# Patient Record
Sex: Female | Born: 1940 | Race: White | Hispanic: No | Smoking: Never smoker
Health system: Southern US, Community
[De-identification: ages and names within clinical notes are randomized; demographics above are authoritative.]

## PROBLEM LIST (undated history)

## (undated) DIAGNOSIS — D649 Anemia, unspecified: Secondary | ICD-10-CM

## (undated) DIAGNOSIS — R51 Headache: Secondary | ICD-10-CM

## (undated) DIAGNOSIS — R519 Headache, unspecified: Secondary | ICD-10-CM

## (undated) DIAGNOSIS — H35033 Hypertensive retinopathy, bilateral: Secondary | ICD-10-CM

## (undated) DIAGNOSIS — Z860100 Personal history of colon polyps, unspecified: Secondary | ICD-10-CM

## (undated) DIAGNOSIS — F32A Depression, unspecified: Secondary | ICD-10-CM

## (undated) DIAGNOSIS — M797 Fibromyalgia: Secondary | ICD-10-CM

## (undated) DIAGNOSIS — J9811 Atelectasis: Secondary | ICD-10-CM

## (undated) DIAGNOSIS — M069 Rheumatoid arthritis, unspecified: Secondary | ICD-10-CM

## (undated) DIAGNOSIS — G63 Polyneuropathy in diseases classified elsewhere: Secondary | ICD-10-CM

## (undated) DIAGNOSIS — M1990 Unspecified osteoarthritis, unspecified site: Secondary | ICD-10-CM

## (undated) DIAGNOSIS — E669 Obesity, unspecified: Secondary | ICD-10-CM

## (undated) DIAGNOSIS — F419 Anxiety disorder, unspecified: Secondary | ICD-10-CM

## (undated) DIAGNOSIS — F329 Major depressive disorder, single episode, unspecified: Secondary | ICD-10-CM

## (undated) DIAGNOSIS — N1330 Unspecified hydronephrosis: Secondary | ICD-10-CM

## (undated) DIAGNOSIS — Z5189 Encounter for other specified aftercare: Secondary | ICD-10-CM

## (undated) DIAGNOSIS — K449 Diaphragmatic hernia without obstruction or gangrene: Secondary | ICD-10-CM

## (undated) DIAGNOSIS — H25013 Cortical age-related cataract, bilateral: Secondary | ICD-10-CM

## (undated) DIAGNOSIS — E7800 Pure hypercholesterolemia, unspecified: Secondary | ICD-10-CM

## (undated) DIAGNOSIS — D1771 Benign lipomatous neoplasm of kidney: Secondary | ICD-10-CM

## (undated) DIAGNOSIS — T4145XA Adverse effect of unspecified anesthetic, initial encounter: Secondary | ICD-10-CM

## (undated) DIAGNOSIS — I10 Essential (primary) hypertension: Secondary | ICD-10-CM

## (undated) DIAGNOSIS — M810 Age-related osteoporosis without current pathological fracture: Secondary | ICD-10-CM

## (undated) HISTORY — DX: Unspecified hydronephrosis: N13.30

## (undated) HISTORY — DX: Personal history of colon polyps, unspecified: Z86.0100

## (undated) HISTORY — DX: Rheumatoid arthritis, unspecified: M06.9

## (undated) HISTORY — DX: Unspecified osteoarthritis, unspecified site: M19.90

## (undated) HISTORY — DX: Benign lipomatous neoplasm of kidney: D17.71

## (undated) HISTORY — PX: OTHER SURGICAL HISTORY: SHX169

## (undated) HISTORY — DX: Anxiety disorder, unspecified: F41.9

## (undated) HISTORY — DX: Hypertensive retinopathy, bilateral: H35.033

## (undated) HISTORY — DX: Diaphragmatic hernia without obstruction or gangrene: K44.9

## (undated) HISTORY — DX: Essential (primary) hypertension: I10

## (undated) HISTORY — DX: Obesity, unspecified: E66.9

## (undated) HISTORY — DX: Age-related osteoporosis without current pathological fracture: M81.0

## (undated) HISTORY — PX: JOINT REPLACEMENT: SHX530

## (undated) HISTORY — DX: Cortical age-related cataract, bilateral: H25.013

## (undated) HISTORY — DX: Encounter for other specified aftercare: Z51.89

## (undated) HISTORY — PX: CATARACT EXTRACTION, BILATERAL: SHX1313

## (undated) HISTORY — PX: NISSEN FUNDOPLICATION: SHX2091

## (undated) HISTORY — DX: Pure hypercholesterolemia, unspecified: E78.00

## (undated) HISTORY — PX: CATARACT EXTRACTION: SUR2

## (undated) HISTORY — DX: Personal history of colonic polyps: Z86.010

---
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
## 1967-07-08 HISTORY — PX: ABDOMINAL HYSTERECTOMY: SHX81

## 1987-07-08 HISTORY — PX: BREAST BIOPSY: SHX20

## 1987-07-08 HISTORY — PX: PARAESOPHAGEAL HERNIA REPAIR: SHX2161

## 1999-07-08 HISTORY — PX: TOTAL KNEE ARTHROPLASTY: SHX125

## 1999-09-25 ENCOUNTER — Encounter: Payer: Self-pay | Admitting: Orthopedic Surgery

## 1999-10-02 ENCOUNTER — Inpatient Hospital Stay (HOSPITAL_COMMUNITY): Admission: RE | Admit: 1999-10-02 | Discharge: 1999-10-07 | Payer: Self-pay | Admitting: Orthopedic Surgery

## 1999-10-02 ENCOUNTER — Encounter: Payer: Self-pay | Admitting: Orthopedic Surgery

## 2006-05-14 ENCOUNTER — Ambulatory Visit: Payer: Self-pay | Admitting: Internal Medicine

## 2006-07-23 ENCOUNTER — Ambulatory Visit: Payer: Self-pay | Admitting: Internal Medicine

## 2007-03-09 ENCOUNTER — Ambulatory Visit (HOSPITAL_COMMUNITY): Admission: RE | Admit: 2007-03-09 | Discharge: 2007-03-10 | Payer: Self-pay | Admitting: General Surgery

## 2007-03-09 ENCOUNTER — Encounter (INDEPENDENT_AMBULATORY_CARE_PROVIDER_SITE_OTHER): Payer: Self-pay | Admitting: General Surgery

## 2008-07-07 HISTORY — PX: TOTAL KNEE ARTHROPLASTY: SHX125

## 2008-07-07 HISTORY — PX: CHOLECYSTECTOMY: SHX55

## 2008-07-31 ENCOUNTER — Inpatient Hospital Stay (HOSPITAL_COMMUNITY): Admission: RE | Admit: 2008-07-31 | Discharge: 2008-08-03 | Payer: Self-pay | Admitting: Orthopedic Surgery

## 2008-08-28 ENCOUNTER — Encounter: Admission: RE | Admit: 2008-08-28 | Discharge: 2008-10-17 | Payer: Self-pay | Admitting: Orthopedic Surgery

## 2009-06-20 ENCOUNTER — Emergency Department (HOSPITAL_COMMUNITY): Admission: EM | Admit: 2009-06-20 | Discharge: 2009-06-20 | Payer: Self-pay | Admitting: Emergency Medicine

## 2009-11-23 ENCOUNTER — Encounter (INDEPENDENT_AMBULATORY_CARE_PROVIDER_SITE_OTHER): Payer: Self-pay | Admitting: Emergency Medicine

## 2009-11-23 ENCOUNTER — Ambulatory Visit: Payer: Self-pay | Admitting: Vascular Surgery

## 2009-11-23 ENCOUNTER — Emergency Department (HOSPITAL_COMMUNITY): Admission: EM | Admit: 2009-11-23 | Discharge: 2009-11-23 | Payer: Self-pay | Admitting: Emergency Medicine

## 2010-07-07 DIAGNOSIS — T8859XA Other complications of anesthesia, initial encounter: Secondary | ICD-10-CM

## 2010-07-07 HISTORY — PX: BACK SURGERY: SHX140

## 2010-07-07 HISTORY — DX: Other complications of anesthesia, initial encounter: T88.59XA

## 2010-08-28 ENCOUNTER — Other Ambulatory Visit (INDEPENDENT_AMBULATORY_CARE_PROVIDER_SITE_OTHER): Payer: Medicare Other

## 2010-08-28 DIAGNOSIS — E7800 Pure hypercholesterolemia, unspecified: Secondary | ICD-10-CM

## 2010-08-28 DIAGNOSIS — Z79899 Other long term (current) drug therapy: Secondary | ICD-10-CM

## 2010-09-03 ENCOUNTER — Ambulatory Visit (INDEPENDENT_AMBULATORY_CARE_PROVIDER_SITE_OTHER): Payer: Medicare Other | Admitting: Cardiology

## 2010-09-03 DIAGNOSIS — E7800 Pure hypercholesterolemia, unspecified: Secondary | ICD-10-CM

## 2010-09-03 DIAGNOSIS — Z79899 Other long term (current) drug therapy: Secondary | ICD-10-CM

## 2010-09-03 DIAGNOSIS — I119 Hypertensive heart disease without heart failure: Secondary | ICD-10-CM

## 2010-09-04 ENCOUNTER — Ambulatory Visit: Payer: Self-pay | Admitting: Cardiology

## 2010-10-21 LAB — COMPREHENSIVE METABOLIC PANEL
ALT: 21 U/L (ref 0–35)
BUN: 19 mg/dL (ref 6–23)
CO2: 31 mEq/L (ref 19–32)
Calcium: 9.2 mg/dL (ref 8.4–10.5)
Creatinine, Ser: 0.94 mg/dL (ref 0.4–1.2)
GFR calc non Af Amer: 59 mL/min — ABNORMAL LOW (ref >60)
Glucose, Bld: 95 mg/dL (ref 70–99)
Sodium: 139 mEq/L (ref 135–145)
Total Protein: 7.1 g/dL (ref 6.0–8.3)

## 2010-10-21 LAB — BASIC METABOLIC PANEL
BUN: 14 mg/dL (ref 6–23)
CO2: 31 mEq/L (ref 19–32)
Calcium: 8.2 mg/dL — ABNORMAL LOW (ref 8.4–10.5)
Calcium: 8.3 mg/dL — ABNORMAL LOW (ref 8.4–10.5)
Creatinine, Ser: 0.79 mg/dL (ref 0.4–1.2)
GFR calc Af Amer: >60 mL/min (ref >60)
GFR calc Af Amer: >60 mL/min (ref >60)
GFR calc non Af Amer: >60 mL/min (ref >60)
Glucose, Bld: 122 mg/dL — ABNORMAL HIGH (ref 70–99)
Potassium: 4.5 mEq/L (ref 3.5–5.1)
Sodium: 140 mEq/L (ref 135–145)

## 2010-10-21 LAB — ABO/RH: ABO/RH(D): O POS

## 2010-10-21 LAB — CBC
HCT: 29.7 % — ABNORMAL LOW (ref 36.0–46.0)
HCT: 30.6 % — ABNORMAL LOW (ref 36.0–46.0)
Hemoglobin: 10 g/dL — ABNORMAL LOW (ref 12.0–15.0)
Hemoglobin: 10.2 g/dL — ABNORMAL LOW (ref 12.0–15.0)
Hemoglobin: 10.7 g/dL — ABNORMAL LOW (ref 12.0–15.0)
Hemoglobin: 13 g/dL (ref 12.0–15.0)
MCHC: 33.5 g/dL (ref 30.0–36.0)
MCHC: 33.5 g/dL (ref 30.0–36.0)
MCV: 90.5 fL (ref 78.0–100.0)
MCV: 91.1 fL (ref 78.0–100.0)
MCV: 91.7 fL (ref 78.0–100.0)
Platelets: 193 10*3/uL (ref 150–400)
RBC: 3.24 MIL/uL — ABNORMAL LOW (ref 3.87–5.11)
RBC: 3.35 MIL/uL — ABNORMAL LOW (ref 3.87–5.11)
RBC: 3.38 MIL/uL — ABNORMAL LOW (ref 3.87–5.11)
RBC: 3.53 MIL/uL — ABNORMAL LOW (ref 3.87–5.11)
RDW: 13.1 % (ref 11.5–15.5)
RDW: 13.5 % (ref 11.5–15.5)
WBC: 13.6 10*3/uL — ABNORMAL HIGH (ref 4.0–10.5)
WBC: 13.8 10*3/uL — ABNORMAL HIGH (ref 4.0–10.5)

## 2010-10-21 LAB — APTT: aPTT: 27 seconds (ref 24–37)

## 2010-10-21 LAB — TYPE AND SCREEN
ABO/RH(D): O POS
Antibody Screen: NEGATIVE

## 2010-10-21 LAB — URINALYSIS, ROUTINE W REFLEX MICROSCOPIC
Ketones, ur: NEGATIVE mg/dL
Nitrite: NEGATIVE
Protein, ur: NEGATIVE mg/dL
Urobilinogen, UA: 0.2 mg/dL (ref 0.0–1.0)

## 2010-10-21 LAB — PROTIME-INR
INR: 0.9 (ref 0.00–1.49)
INR: 1.1 (ref 0.00–1.49)
INR: 2.2 — ABNORMAL HIGH (ref 0.00–1.49)
Prothrombin Time: 12.6 seconds (ref 11.6–15.2)
Prothrombin Time: 14.2 seconds (ref 11.6–15.2)

## 2010-11-18 ENCOUNTER — Other Ambulatory Visit (HOSPITAL_COMMUNITY): Payer: Self-pay | Admitting: Orthopedic Surgery

## 2010-11-18 DIAGNOSIS — C189 Malignant neoplasm of colon, unspecified: Secondary | ICD-10-CM

## 2010-11-19 NOTE — Op Note (Signed)
NAMESEBA, MADOLE NO.:  [id number]   MEDICAL RECORD NO.:  [id number]          PATIENT TYPE:  INP   LOCATION:  0007                         FACILITY:  [facility]   PHYSICIAN:  Ollen Gross, M.D.    DATE OF BIRTH:  11/20/1940   DATE OF PROCEDURE:  07/31/2008  DATE OF DISCHARGE:                               OPERATIVE REPORT   PREOPERATIVE DIAGNOSIS:  Osteoarthritis, right knee.   POSTOPERATIVE DIAGNOSIS:  Osteoarthritis, right knee.   PROCEDURE:  Right total knee arthroplasty.   SURGEON:  Ollen Gross, M.D.   ASSISTANT:  Avel Peace PA-C   ANESTHESIA:  General with postop Marcaine pain pump.   ESTIMATED BLOOD LOSS:  Minimal.   DRAINS:  None.   TOURNIQUET TIME:  33 minutes at 300 mmHg.   COMPLICATIONS:  None.   CONDITION:  Stable to recovery room.   CLINICAL NOTE:  Ms. Venier is a 70 year old female with end-stage  arthritis of the right knee with progressively worsening pain and  dysfunction.  She has failed nonoperative management and presents now  for right total knee arthroplasty.   PROCEDURE IN DETAIL:  After successful administration of general  anesthetic, a tourniquet is placed high on her right thigh and the right  lower extremity is prepped and draped in the usual sterile fashion.  Extremities wrapped in Esmarch, knee flexed, tourniquet inflated to 300  mmHg.  Midline incision was made with 10 blade through subcutaneous  tissue to the level of the extensor mechanism.  A fresh blade is used to  make a medial parapatellar arthrotomy.  Soft tissue over the proximal  medial tibia is subperiosteally elevated to the joint line with the  knife and into the semimembranosus bursa with a Cobb elevator.  Soft  tissue laterally is elevated with attention being paid to avoiding the  patellar tendon on tibial tubercle.  The patella was subluxed laterally,  knee flexed 90 degrees, ACL and PCL removed.  A drill was used to create  a starting hole in the  distal femur.  The canal was thoroughly  irrigated.  The 5 degrees right valgus alignment guide is placed  referencing off the posterior condyles, rotations marked and the block  pinned to remove 11 mm of the distal femur.  I took 11 because of preop  flexion contracture.  Distal femoral resection is made with an  oscillating saw.  Sizing block was placed and size 3 is most  appropriate.  Rotations marked off the epicondylar axis.  Size 3 cutting  block is placed and the anterior, posterior chamfer cuts are made.   The tibia is subluxed forward and the menisci are removed.  Extramedullary tibial alignment guide is placed referencing proximally  at the medial aspect of the tibial tubercle or distally along the second  metatarsal axis and tibial crest.  The block is pinned to remove 2 mm  off the more deficient medial side.  Tibial resection is made with an  oscillating saw.  Size 3 is the most appropriate tibial component and  the proximal tibia is then prepared with the modular drill and keel  punch for the size 3 three.  Femoral preparation is completed with the  intercondylar cut.   Size 3 mobile bearing tibial trial, size 3 posterior stabilized femoral  trial and a 10 mm posterior stabilized rotating platform insert trial  are placed.  With a 10 she hyperextended a tiny bit so I went to 12.5  which allowed for full extension with excellent varus-valgus, anterior-  posterior balance throughout full range of motion.  Patella was then  everted and thickness measured to be a 22 mm.  Freehand resection was  taken to 12 mm, 38 template is placed, lug holes were drilled, trial  patella was placed and it tracks normally.  Osteophytes removed off the  posterior femur with the trial in place.  All trials are removed and the  cut bone surfaces are prepared with pulsatile lavage.  Cement was mixed  and once ready for implantation. the size 3 mobile bearing tibial tray,  size 3 posterior  stabilized femur and 38 patella are cemented into  place.  Patella was held with a clamp.  Trial 12.5 insert is placed,  knee held in full extension and all extruded cement removed.  When the  cement is fully hardened then the permanent 12.5 mm posterior stabilized  rotating platform insert is placed into the tibial tray.  The wound was  copiously irrigated with saline solution and the FloSeal injected on the  posterior capsule, mediolateral gutters and suprapatellar area.  Moist  sponge is placed and tourniquet released for total time of 33 minutes.  The sponge is held for 2 minutes then removed.  Minimal bleeding was  encountered.  The bleeding that is encountered is stopped with  electrocautery.  The wound is then copiously irrigated with saline  solution and the arthrotomy closed with interrupted #1 PDS.  Flexion  against gravity is about 135 degrees.  Subcu is closed with interrupted  2-0 Vicryl and subcuticular running 4-0 Monocryl.  The incision is  cleaned and dried and the catheter for the Marcaine pain pump was placed  in the pump is initiated.  Steri-Strips and bulky sterile dressing are  applied and she is placed into a knee immobilizer, awakened and  transferred to recovery in stable condition.      Ollen Gross, M.D.  Electronically Signed     FA/MEDQ  D:  07/31/2008  T:  07/31/2008  Job:  [id number]

## 2010-11-19 NOTE — Op Note (Signed)
Laura Chambers, Laura Chambers             ACCOUNT NO.:  [id number]   MEDICAL RECORD NO.:  [id number]          PATIENT TYPE:  AMB   LOCATION:  DAY                          FACILITY:  [facility]   PHYSICIAN:  Sharlet Salina T. Hoxworth, M.D.DATE OF BIRTH:  [birth year]   DATE OF PROCEDURE:  03/09/2007  DATE OF DISCHARGE:                               OPERATIVE REPORT   PREOPERATIVE DIAGNOSIS:  Cholelithiasis and cholecystitis.   POSTOPERATIVE DIAGNOSIS:  Cholelithiasis and cholecystitis.   SURGICAL PROCEDURE:  Laparoscopic cholecystectomy with intraoperative  cholangiogram.   SURGEON:  Sharlet Salina T. Hoxworth, M.D.   ANESTHESIA:  General.   BRIEF HISTORY:  Laura Chambers is a 70 year old female who presents  with one severe episode of epigastric and right upper quadrant pain  radiating to her back typical for biliary colic.  She has had some  ongoing milder symptoms.  Workup has included a gallbladder ultrasound  showing a large gallstone.  She is felt to have symptomatic  cholelithiasis.  Laparoscopic cholecystectomy with cholangiogram has  been recommended and accepted.  The patient has had a previous Nissen  fundoplication open years ago through an upper midline incision.  The  nature of the procedure, risks of bleeding, infection, bile leak, bile  duct injury, or possible need for open procedure due to adhesions have  been discussed understood.  She is brought to the operating room for the  procedure.   DESCRIPTION OF OPERATION:  The patient was brought to the operating  room, placed in the supine position on the operating table and general  endotracheal anesthesia was induced.  The abdomen was widely sterilely  prepped and draped.  She received preoperative IV antibiotics.  PAS were  in place.  Correct patient and procedure were verified.  Trocar sites  were infiltrated with local anesthesia.  Access was then obtained in the  right subcostal space with a 5-mm OptiView trocar and free  peritoneal  cavity entered without difficulty.  The right upper quadrant was free of  adhesions.  There were some adhesions extensively along the midline in  the entire upper abdomen extending over toward the right upper quadrant.  A second 5-mm trocar was placed under direct vision more laterally.  With these two sites and using a 5-mm scope, extensive omental adhesions  were taken down from the anterior abdominal wall back to the midline.  The transverse colon was also identified up to the abdominal wall but  not densely adherent and it was able to be avoided clearing back to the  midline.  Following this under direct vision an 11-mm trocar was placed  near the umbilicus just to the right of the midline incision and another  11-mm trocar just to the right of the xiphoid.  The 10-mm camera was  placed through the periumbilical port.  The gallbladder fundus was  grasped and elevated up over the liver and the infundibulum retracted  inferolaterally.  There was no severe inflammation.  Peritoneum anterior  and posterior Calot's triangle was incised and fibrofatty tissue was  stripped off the neck of the gallbladder toward the porta hepatis.  The  anterior branch  of the cystic artery was clearly seen coursing upon the  gallbladder wall.  It was divided between two proximal and one distal  clip.  Calot's triangle was thoroughly dissected, the cystic duct  identified at the gallbladder junction and dissected 360 degrees and  then dissected out over about a centimeter.  When the anatomy was clear,  the cystic duct was clipped at the gallbladder junction and an operative  cholangiogram obtained through the cystic duct.  This showed a long  corkscrew cystic duct which entered a normal common bile duct and  intrahepatic ducts with free flow into the duodenum and no filling  defects.  Following this, the cholangiocatheter was removed.  The cystic  duct was dissected down about another centimeter to  where it tapered  down a little bit more, and at this point it was triply clipped and  divided.  The gallbladder was dissected free from its bed using hook  cautery.  A posterior branch of the cystic artery was divided between  clips.  The gallbladder was detached and removed through the  periumbilical site.  The right upper quadrant was thoroughly irrigated  and complete hemostasis assured.  There was a large gallstone that  required dilating the 11-mm trocar site to remove the gallbladder.  This  was then closed under direct vision with a couple of interrupted 0  Vicryl sutures placed with the Endoclose.  The operative site was again  inspected for hemostasis.  All CO2 was evacuated and trocars removed.  Skin incisions were closed with interrupted subcuticular Monocryl and  Dermabond.  The patient was taken to the recovery room in good  condition.      Lorne Skeens. Hoxworth, M.D.  Electronically Signed     BTH/MEDQ  D:  03/09/2007  T:  03/09/2007  Job:  [id number]

## 2010-11-19 NOTE — Discharge Summary (Signed)
NAMEGERALDA, BAUMGARDNER NO.:  [id number]   MEDICAL RECORD NO.:  [id number]          PATIENT TYPE:  INP   LOCATION:  1611                         FACILITY:  [facility]   PHYSICIAN:  Ollen Gross, M.D.    DATE OF BIRTH:  October 06, 1940   DATE OF ADMISSION:  07/31/2008  DATE OF DISCHARGE:  08/03/2008                               DISCHARGE SUMMARY   ADMISSION DIAGNOSES:  1. End-stage arthritis right knee.  2. Osteoarthritis right hip.  3. Hypertension.  4. Hypercholesterolemia.  5. Paraesophageal hernia.  6. Diverticulosis.  7. History of colon cancer.  8. Osteoporosis.  9. Degenerative disk disease.   DISCHARGE DIAGNOSES:  1. Osteoarthritis right knee status post right total knee replacement      arthroplasty.  2. Mild postoperative acute blood loss anemia, did not require      transfusion.  3. Hypertension.  4. Hypercholesterolemia.  5. Paraesophageal hernia.  6. Diverticulosis.  7. History of colon cancer.  8. Osteoporosis.  9. Degenerative disk disease.   PROCEDURE:  July 31, 2008, right total knee arthroplasty.   SURGEON:  Ollen Gross, M.D.   ASSISTANT:  Alexzandrew L. Perkins, P.A.C.   ANESTHESIA:  General.   CONSULTATIONS:  None.   BRIEF HISTORY:  Ms. Wissner is a 70 year old female with end-stage  arthritis of the right knee, progressive worsening pain and dysfunction  who failed operative management, and now presents for right total knee  arthroplasty.   LABORATORY DATA:  Preoperative CBC showed hemoglobin 13, hematocrit  39.0, white cell count 10.5, platelets 194.  Serial CBCs were followed.  Hemoglobin dropped down to 10.3 and then back to 10.7.  Last known H and  H 10.0 and 29.7.  PT/PTT preoperative 12.6 and 27 respectively, INR  0.90.  Serial pro-times followed on Coumadin protocol.  Last PT/INR 24.0  and 2.0.  Chem panel on admission all within normal limits.  Serial  BMETs were followed.  Electrolytes remained within normal limits.  Preoperative UA was negative.  Blood group type O+.   DIAGNOSTICS:  EKG July 19, 2008:  Normal sinus rhythm within normal  limits confirmed by Dr. Patty Sermons.   HOSPITAL COURSE:  The patient was admitted to [facility] and  taken to OR for the above stated procedure without complication.  The  patient tolerated the procedure well and then transferred to the  recovery room on the orthopedic floor.  Started on PCA and p.o.  analgesic pain control following surgery.  Given 20 hours of  postoperative IV antibiotics.  She was started back on her home meds,  but blood pressure meds were put on parameters to try to avoid any  hypotension postoperative.  She had excellent urinary output on day one.  She had some drainage on the dressing, so we changed that on day one.  Incision looked good.  Hemoglobin was stable at 10.3.  She started  getting up out of bed with PT.  She walked about 20 feet.  By day 2, she  was actually doing better.  Hemoglobin was stable.  Incision continued  to heal well.  She continued  to progress with physical therapy walking  about 20 feet and that afternoon got up to about 200 feet.  She had very  well that afternoon.  She was seen on the morning of day 3.  Her pain  was under good control.  She had met her goals with therapy and was  discharged home.   DISCHARGE/PLAN:  The patient was discharged home on August 03, 2008.   DISCHARGE MEDICATIONS:  1. Dilaudid.  2. Robaxin.  3. Coumadin.   FOLLOW UP:  Follow up in 2 weeks.   ACTIVITY:  Weightbearing as tolerated.  Total knee protocol.  Home  health PT and home nursing.  Follow up in 2 weeks.   DISPOSITION:  Home.   CONDITION ON DISCHARGE:  Improving.      Alexzandrew L. Perkins, P.A.C.      Ollen Gross, M.D.  Electronically Signed    ALP/MEDQ  D:  09/07/2008  T:  09/07/2008  Job:  [id number]   cc:   Ollen Gross, M.D.  Fax: [phone]   Cassell Clement, M.D.  Fax: [phone]

## 2010-11-19 NOTE — H&P (Signed)
Laura Chambers, Laura Chambers NO.:  [id number]   MEDICAL RECORD NO.:  [id number]          PATIENT TYPE:  INP   LOCATION:                               FACILITY:  [facility]   PHYSICIAN:  Ollen Gross, M.D.    DATE OF BIRTH:  1941-01-01   DATE OF ADMISSION:  07/31/2008  DATE OF DISCHARGE:                              HISTORY & PHYSICAL   DATE OF ADMISSION:  July 31, 2008.   HISTORY OF PRESENT ILLNESS:  [name] is a 70 year old female with  right leg pain.  Evaluations have shown that she has significant  arthritic changes of the knee with end-stage osteoarthritis of the knee,  but she also has arthritic changes of her right hip.  The patient has  pain with range of motion of the knee, pain with weightbearing.  She has  failed conservative treatment.  The patient has elected to proceed with  a total knee arthroplasty.   ALLERGIES:  TROVAN, LEVAQUIN, MORPHINE, MUSCLE SPASMS WITH LOCAL  ANESTHETIC.   PRIMARY CARE PHYSICIAN:  Cassell Clement, M.D.   CURRENT MEDICATIONS:  1. Atenolol 25 mg once a day.  2. Hyzaar 100/25 mg once a day.  3. Crestor 15 mg a day.  4. Folic acid.  5. Os-Cal 600 plus D.  6. Centrum Silver.   PAST MEDICAL HISTORY:  Includes:  1. Hypertension.  2. Hypercholesterolemia.  3. Paraesophageal hernia.  4. Diverticulitis.  5. History of colon cancer.  6. Osteoporosis.  7. Degenerative disk disease.   REVIEW OF SYSTEMS:  Is negative for any neurologic issues or pulmonary  issues.  CARDIOVASCULAR:  Hypertension is well-controlled.  She is  scheduled for a stress test evaluation by Dr. Patty Sermons this week.  GI:  She has had a paraesophageal hernia.  She does report a history of bad  reflux and ulcers in the past but it improved when she had surgical  repair of her hernia.  She denies any liver issues.  Her last  diverticulitis attack was several years previous.  She denies any GU,  endocrine or hematologic issues at this time.   PAST  SURGICAL HISTORY:  Includes:  1. A T and A in 1946.  2. Hysterectomy in 1969.  3. Paraesophageal hernia and esophageal wrap in 1989.  4. Left knee replacement in 2001.  5. Cholecystectomy in 2008 without any anesthesia problems other than      significant nausea.  Due to the esophageal wrap the patient      indicates she is a very difficult time vomiting.   FAMILY MEDICAL HISTORY:  Father is deceased from diabetic complications  at 43.  Mother is deceased at 43 from COPD.  Three siblings with  diabetes, hypertension, arthritis.   SOCIAL HISTORY:  The patient is widowed.  She was an Print production planner in  the past.  Never smoked.  No alcohol.  Lives alone in a [place] house.   PHYSICAL EXAM:  VITALS:  Height is 5 feet 7 inches, weight is 198  pounds.  Temperature:  She is afebrile.  Pulse of 74, respirations of  12, blood pressure  is 158/78.  GENERAL:  The patient is a healthy-appearing, well-developed female,  conscious, alert and appropriate.  HEENT:  Head was normocephalic.  Gross hearing is intact.  NECK:  Was supple.  Good range of motion.  No lymphadenopathy.  CHEST:  Lung sounds were clear and equal bilaterally.  HEART:  Regular rate and rhythm.  ABDOMEN:  Soft, nontender.  Bowel sounds present.  EXTREMITIES:  Right knee lacked about 5 degrees of extension.  She is  able to flex it back to 120.  She had no gross instability.  Her right  hip:  She was able to fully extend, flexion up to 110, 20 degrees of  internal rotation, 10 degrees of external rotation.  She had full range  of motion of her left lower extremity at the hip and knee today without  any difficulty.  She had good range of motion of her upper extremities.  PERIPHERAL VASCULAR:  Carotid pulses were 2+, no bruits.  Radial pulses  2+.  Dorsalis pedis pulses 1+.  NEURO:  The patient was conscious, alert and appropriate.   IMPRESSION:  1. End-stage osteoarthritis right knee.  2. Osteoarthritis right hip.  3.  Hypertension.  4. Hypercholesterolemia.  5. Paraesophageal hernia repair.   PLAN:  The patient will undergo all routine laboratories and tests prior  to having a right total knee arthroplasty by Dr. Lequita Halt at [facility] on July 31, 2008.      Jamelle Rushing, P.A.      Ollen Gross, M.D.  Electronically Signed    RWK/MEDQ  D:  07/17/2008  T:  07/17/2008  Job:  [id number]   cc:   Ollen Gross, M.D.  Fax: [phone]

## 2010-11-21 ENCOUNTER — Ambulatory Visit (HOSPITAL_COMMUNITY): Payer: Medicare Other

## 2010-11-21 ENCOUNTER — Encounter (HOSPITAL_COMMUNITY): Payer: Self-pay

## 2010-11-21 ENCOUNTER — Inpatient Hospital Stay (HOSPITAL_COMMUNITY): Admission: RE | Admit: 2010-11-21 | Payer: Medicare Other | Source: Ambulatory Visit

## 2010-11-21 ENCOUNTER — Encounter (HOSPITAL_COMMUNITY)
Admission: RE | Admit: 2010-11-21 | Discharge: 2010-11-21 | Disposition: A | Discharge status: Home / Self Care | Payer: Medicare Other | Source: Ambulatory Visit | Attending: Orthopedic Surgery | Admitting: Orthopedic Surgery

## 2010-11-21 DIAGNOSIS — R109 Unspecified abdominal pain: Secondary | ICD-10-CM | POA: Insufficient documentation

## 2010-11-21 DIAGNOSIS — C189 Malignant neoplasm of colon, unspecified: Secondary | ICD-10-CM

## 2010-11-21 DIAGNOSIS — N1330 Unspecified hydronephrosis: Secondary | ICD-10-CM | POA: Insufficient documentation

## 2010-11-21 DIAGNOSIS — Z96659 Presence of unspecified artificial knee joint: Secondary | ICD-10-CM | POA: Insufficient documentation

## 2010-11-21 DIAGNOSIS — M545 Low back pain, unspecified: Secondary | ICD-10-CM | POA: Insufficient documentation

## 2010-11-21 DIAGNOSIS — M25559 Pain in unspecified hip: Secondary | ICD-10-CM | POA: Insufficient documentation

## 2010-11-21 DIAGNOSIS — M79609 Pain in unspecified limb: Secondary | ICD-10-CM | POA: Insufficient documentation

## 2010-11-21 DIAGNOSIS — Z85038 Personal history of other malignant neoplasm of large intestine: Secondary | ICD-10-CM | POA: Insufficient documentation

## 2010-11-21 MED ORDER — TECHNETIUM TC 99M MEDRONATE IV KIT
25.0000 | PACK | Freq: Once | INTRAVENOUS | Status: AC | PRN
  Administered 2010-11-21: 25 via INTRAVENOUS

## 2011-01-03 ENCOUNTER — Encounter: Payer: Self-pay | Admitting: Cardiology

## 2011-02-13 ENCOUNTER — Telehealth: Payer: Self-pay | Admitting: Cardiology

## 2011-02-13 ENCOUNTER — Encounter (HOSPITAL_COMMUNITY)
Admission: RE | Admit: 2011-02-13 | Discharge: 2011-02-13 | Disposition: A | Discharge status: Home / Self Care | Payer: Medicare Other | Source: Ambulatory Visit | Attending: Neurosurgery | Admitting: Neurosurgery

## 2011-02-13 LAB — BASIC METABOLIC PANEL
BUN: 20 mg/dL (ref 6–23)
CO2: 29 mEq/L (ref 19–32)
Calcium: 10 mg/dL (ref 8.4–10.5)
Glucose, Bld: 100 mg/dL — ABNORMAL HIGH (ref 70–99)
Sodium: 138 mEq/L (ref 135–145)

## 2011-02-13 LAB — SURGICAL PCR SCREEN: Staphylococcus aureus: NEGATIVE

## 2011-02-13 LAB — DIFFERENTIAL
Basophils Relative: 0 % (ref 0–1)
Lymphocytes Relative: 17 % (ref 12–46)
Lymphs Abs: 2.4 10*3/uL (ref 0.7–4.0)
Monocytes Absolute: 1.2 10*3/uL — ABNORMAL HIGH (ref 0.1–1.0)
Monocytes Relative: 8 % (ref 3–12)
Neutro Abs: 10.1 10*3/uL — ABNORMAL HIGH (ref 1.7–7.7)
Neutrophils Relative %: 72 % (ref 43–77)

## 2011-02-13 LAB — CBC
HCT: 38 % (ref 36.0–46.0)
Hemoglobin: 12.6 g/dL (ref 12.0–15.0)
MCH: 28.8 pg (ref 26.0–34.0)
MCV: 86.8 fL (ref 78.0–100.0)
RBC: 4.38 MIL/uL (ref 3.87–5.11)

## 2011-02-13 NOTE — Telephone Encounter (Signed)
Faxed Last OV, EKG, and Chest XRAY to [phone] today

## 2011-02-13 NOTE — Telephone Encounter (Signed)
Please fax last OV, EKG, and Chest Xray if available to [facility]

## 2011-02-24 ENCOUNTER — Ambulatory Visit (HOSPITAL_COMMUNITY)
Admission: RE | Admit: 2011-02-24 | Discharge: 2011-02-24 | Disposition: A | Discharge status: Home / Self Care | Payer: Medicare Other | Source: Ambulatory Visit | Attending: Neurosurgery | Admitting: Neurosurgery

## 2011-02-24 ENCOUNTER — Other Ambulatory Visit (HOSPITAL_COMMUNITY): Payer: Self-pay | Admitting: Neurosurgery

## 2011-02-24 ENCOUNTER — Inpatient Hospital Stay (HOSPITAL_COMMUNITY)
Admission: RE | Admit: 2011-02-24 | Discharge: 2011-02-25 | DRG: 460 | Disposition: A | Discharge status: Home / Self Care | Payer: Medicare Other | Source: Ambulatory Visit | Attending: Neurosurgery | Admitting: Neurosurgery

## 2011-02-24 DIAGNOSIS — M4310 Spondylolisthesis, site unspecified: Principal | ICD-10-CM | POA: Diagnosis present

## 2011-02-24 DIAGNOSIS — R52 Pain, unspecified: Secondary | ICD-10-CM

## 2011-02-24 DIAGNOSIS — M5126 Other intervertebral disc displacement, lumbar region: Secondary | ICD-10-CM

## 2011-02-24 DIAGNOSIS — Z01812 Encounter for preprocedural laboratory examination: Secondary | ICD-10-CM

## 2011-02-24 DIAGNOSIS — M48061 Spinal stenosis, lumbar region without neurogenic claudication: Secondary | ICD-10-CM | POA: Diagnosis present

## 2011-02-24 LAB — ABO/RH: ABO/RH(D): O POS

## 2011-02-24 LAB — TYPE AND SCREEN

## 2011-03-03 NOTE — Op Note (Signed)
Laura Chambers, STARE NO.:  [id number]  MEDICAL RECORD NO.:  [id number]  LOCATION:                               FACILITY:  [facility]  PHYSICIAN:  Sherilyn Cooter A. Keni Elison, M.D.    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  PROCEDURE NAME:  L4-5 decompressive laminectomy with bilateral L4 and L5 decompressive foraminotomies, more than would be required for simple interbody fusion alone.  L4-5 posterior lumbar fusion utilizing tangent interbody allograft wedge, Telamon interbody PEEK cage and local autografting.  L4-5 posterolateral arthrodesis utilizing nonsegmental pedicle screw fixation and local autografts.  SURGEON:  Kathaleen Maser. Kimberlee Shoun, MD  ASSISTANT:  None.  ANESTHESIA:  General endotracheal.  INDICATIONS:  [name] is a 70 year old female with history of back and bilateral lower extremity pain, paresthesias and weakness, consistent with neurogenic claudication.  Workup demonstrates evidence of marked stenosis and a grade 1 degenerative spondylolisthesis with severe stenosis.  The patient presents now for decompression and fusion after failing conservative management.  OPERATION:  The patient was brought to the operating room and placed on the table in supine position.  After adequate level of anesthesia was achieved, the patient was placed prone onto Wilson frame, appropriately padded.  The patient's lumbar region was prepped and draped in sterilely.  A 10-blade was used to make a curvilinear skin incision overlying the L4-5 interspace.  This was carried down sharply in the midline.  Subperiosteal dissection was then performed exposing the lamina and facet joints of L4 and L5 as well as the transverse  processes of before-mentioned levels.  Decompressive laminectomy was then performed using Leksell rongeurs, Kerrison rongeurs, and high-speed drill to remove the entire lamina of L4, inferior facets bilaterally, superior facets bilaterally, and the superior rim of the L5 lamina. Ligamentum flavum was then elevated and resected in piecemeal fashion using Kerrison rongeurs.  The underlying thecal sac and exiting L4 and L5 nerve roots were identified.  Wide decompressive foraminotomies were then performed along the course of the exiting nerve roots.  Bilateral diskectomies were then performed at L4-5.  Disk space was then sequentially dilated up to 8 mm with 8 mm distractor was left in place on the patient's right side.  Thecal sac and nerve roots were dissected on the left side.  Disk space was then reamed and cut with 8-mm tangent instrument.  Soft tissue was then removed from the interspace.  An 8 x 26-mm tangent wedge was then packed into place, recessed approximately 2 mm from the posterior cortical margin of L4.  Distractor was removed from the patient's right side.  Thecal sac and nerve roots were dissected on the right side.  Disk space once again reamed and then cut with 8-mm tangent instrument.  Soft tissue was then removed from the interspace.  Morselized autograft was packed in the interspace for later fusion.  An 8 x 26-mm Telamon cage packed with morselized autograft and then packed into place and recessed approximately 2 mm  from the posterior cortical margin of L4.  Pedicles of L4 and L5 were then identified using surface landmarks and intraoperative fluoroscopy. Superficial bone was removed overlying the pedicle using the high-speed drill.  Each pedicle was then probed using pedicle awl.  Each pedicle awl track was then tapped with 5.25-mm screw tapper.  Screw down hole was then probed and found to be solidly with bone.  A 5.75 x 45-mm radius screw was placed bilaterally at  L4 and 5.75 x 40-mm screw was placed bilaterally at L5.  Transverse processes of L4 and L5 were then decorticated using the high-speed drill.  Morselized autograft was packed posterolaterally for later fusion.  Short segment titanium rod was then placed over screw heads at L4 and L5.  Locking caps were placed over the screw.  Locking caps were then engaged with construct under compression.  Final images revealed good position of bone grafts and hardware at proper operative level with normal alignment of spine. Wound was then irrigated with antibiotic solution.  Gelfoam was placed topically for hemostasis.  A medium Hemovac drain was left in the epidural space.  The wound was then closed in layers with Vicryl sutures.  Steri-Strips and sterile dressings were applied.  There were no complications.  The patient tolerated the procedure well and she returned to recovery room postoperatively.          ______________________________ Kathaleen Maser Arushi Partridge, M.D.     HAP/MEDQ  D:  02/24/2011  T:  02/25/2011  Job:  [id number]  Electronically Signed by Julio Sicks M.D. on 03/03/2011 07:45:19 AM

## 2011-03-26 ENCOUNTER — Other Ambulatory Visit: Payer: Self-pay | Admitting: *Deleted

## 2011-03-26 DIAGNOSIS — G4700 Insomnia, unspecified: Secondary | ICD-10-CM

## 2011-03-26 MED ORDER — ZOLPIDEM TARTRATE 10 MG PO TABS: 10.0000 mg | ORAL_TABLET | Freq: Every evening | ORAL | Status: DC | PRN

## 2011-03-26 NOTE — Telephone Encounter (Signed)
Faxed signed Rx back 

## 2011-04-02 ENCOUNTER — Ambulatory Visit
Admission: RE | Admit: 2011-04-02 | Discharge: 2011-04-02 | Disposition: A | Discharge status: Home / Self Care | Payer: Medicare Other | Source: Ambulatory Visit | Attending: Neurosurgery | Admitting: Neurosurgery

## 2011-04-02 ENCOUNTER — Other Ambulatory Visit: Payer: Self-pay | Admitting: Neurosurgery

## 2011-04-02 DIAGNOSIS — M4310 Spondylolisthesis, site unspecified: Secondary | ICD-10-CM

## 2011-04-18 LAB — URINALYSIS, ROUTINE W REFLEX MICROSCOPIC
Bilirubin Urine: NEGATIVE
Ketones, ur: NEGATIVE
Nitrite: NEGATIVE
pH: 6.5

## 2011-04-18 LAB — COMPREHENSIVE METABOLIC PANEL
ALT: 20
Alkaline Phosphatase: 78
CO2: 26
GFR calc non Af Amer: >60
Glucose, Bld: 108 — ABNORMAL HIGH
Potassium: 3.9
Sodium: 141
Total Bilirubin: 1.6 — ABNORMAL HIGH

## 2011-04-18 LAB — CBC
Hemoglobin: 12.8
MCHC: 34.5
RBC: 4.26

## 2011-04-18 LAB — URINE MICROSCOPIC-ADD ON

## 2011-05-26 ENCOUNTER — Other Ambulatory Visit: Payer: Self-pay | Admitting: Neurosurgery

## 2011-05-26 ENCOUNTER — Ambulatory Visit
Admission: RE | Admit: 2011-05-26 | Discharge: 2011-05-26 | Disposition: A | Discharge status: Home / Self Care | Payer: Medicare Other | Source: Ambulatory Visit | Attending: Neurosurgery | Admitting: Neurosurgery

## 2011-05-26 DIAGNOSIS — M545 Low back pain, unspecified: Secondary | ICD-10-CM

## 2011-09-04 ENCOUNTER — Other Ambulatory Visit: Payer: Self-pay | Admitting: *Deleted

## 2011-09-04 DIAGNOSIS — G4700 Insomnia, unspecified: Secondary | ICD-10-CM

## 2011-09-04 NOTE — Telephone Encounter (Signed)
Refilled per fax request,  Dr. Patty Sermons ok'd

## 2011-09-07 MED ORDER — ZOLPIDEM TARTRATE 10 MG PO TABS: 10.0000 mg | ORAL_TABLET | Freq: Every evening | ORAL | Status: DC | PRN

## 2011-09-25 ENCOUNTER — Other Ambulatory Visit: Payer: Self-pay | Admitting: *Deleted

## 2011-09-25 DIAGNOSIS — E7800 Pure hypercholesterolemia, unspecified: Secondary | ICD-10-CM

## 2011-09-25 DIAGNOSIS — I119 Hypertensive heart disease without heart failure: Secondary | ICD-10-CM

## 2011-09-25 MED ORDER — ROSUVASTATIN CALCIUM 10 MG PO TABS: 10.0000 mg | ORAL_TABLET | Freq: Every day | ORAL | Status: DC

## 2011-09-25 MED ORDER — LOSARTAN POTASSIUM-HCTZ 100-25 MG PO TABS: 1.0000 | ORAL_TABLET | Freq: Every day | ORAL | Status: DC

## 2011-09-25 MED ORDER — ATENOLOL 25 MG PO TABS: 25.0000 mg | ORAL_TABLET | Freq: Every day | ORAL | Status: DC

## 2011-09-25 NOTE — Telephone Encounter (Signed)
Patient called requesting refills

## 2011-10-20 ENCOUNTER — Encounter: Payer: Self-pay | Admitting: *Deleted

## 2012-02-03 ENCOUNTER — Telehealth: Payer: Self-pay | Admitting: Internal Medicine

## 2012-02-03 ENCOUNTER — Encounter: Payer: Self-pay | Admitting: *Deleted

## 2012-02-03 NOTE — Telephone Encounter (Signed)
Called the office back and asked why the pt was not calling herself. Per the office pt thought she might be seen quicker for them to call. Attempted to call pt directly and keep getting a busy signal. Will try later.  Pt scheduled to see Willette Cluster NP tomorrow morning at 9am. Pt aware of appt date and time. Reports that ever since her back surgery a year ago she has had a choking sensation and a "burning in her back." Also reports some pain where her esophagus joins the stomach. Pt states she will bring her records from the work-up she had with her back surgery.

## 2012-02-04 ENCOUNTER — Encounter: Payer: Self-pay | Admitting: Nurse Practitioner

## 2012-02-04 ENCOUNTER — Encounter: Payer: Self-pay | Admitting: Internal Medicine

## 2012-02-04 ENCOUNTER — Ambulatory Visit (INDEPENDENT_AMBULATORY_CARE_PROVIDER_SITE_OTHER): Payer: Medicare Other | Admitting: Nurse Practitioner

## 2012-02-04 VITALS — BP 124/80 | HR 68 | Ht 67.0 in | Wt 199.4 lb

## 2012-02-04 DIAGNOSIS — R198 Other specified symptoms and signs involving the digestive system and abdomen: Secondary | ICD-10-CM

## 2012-02-04 DIAGNOSIS — R0989 Other specified symptoms and signs involving the circulatory and respiratory systems: Secondary | ICD-10-CM | POA: Insufficient documentation

## 2012-02-04 DIAGNOSIS — R110 Nausea: Secondary | ICD-10-CM | POA: Insufficient documentation

## 2012-02-04 DIAGNOSIS — R194 Change in bowel habit: Secondary | ICD-10-CM | POA: Insufficient documentation

## 2012-02-04 DIAGNOSIS — R12 Heartburn: Secondary | ICD-10-CM | POA: Insufficient documentation

## 2012-02-04 DIAGNOSIS — F458 Other somatoform disorders: Secondary | ICD-10-CM

## 2012-02-04 NOTE — Patient Instructions (Addendum)
We have scheduled the Endoscopy with Dr Lina Sar for 03-24-2012. Directions provided.  Your colonoscopy is due in 2015 per Dr. Lina Sar.  Upper GI Endoscopy Upper GI endoscopy means using a flexible scope to look at the esophagus, stomach, and upper small bowel. This is done to make a diagnosis in people with heartburn, abdominal pain, or abnormal bleeding. Sometimes an endoscope is needed to remove foreign bodies or food that become stuck in the esophagus; it can also be used to take biopsy samples. For the best results, do not eat or drink for 8 hours before having your upper endoscopy.  To perform the endoscopy, you will probably be sedated and your throat will be numbed with a special spray. The endoscope is then slowly passed down your throat (this will not interfere with your breathing). An endoscopy exam takes 15 to 30 minutes to complete and there is no real pain. Patients rarely remember much about the procedure. The results of the test may take several days if a biopsy or other test is taken.  You may have a sore throat after an endoscopy exam. Serious complications are very rare. Stick to liquids and soft foods until your pain is better. Do not drive a car or operate any dangerous equipment for at least 24 hours after being sedated. SEEK IMMEDIATE MEDICAL CARE IF:   You have severe throat pain.   You have shortness of breath.   You have bleeding problems.   You have a fever.   You have difficulty recovering from your sedation.  Document Released: 07/31/2004 Document Revised: 06/12/2011 Document Reviewed: 06/25/2008 [facility] Patient Information 2012 [place], [place].

## 2012-02-04 NOTE — Progress Notes (Signed)
Laura Chambers [id number] Mar 15, 1941   HISTORY OR PRESENT ILLNESS :  Patient is a 71 year old female known to Dr. 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 Patient has not been seen here in years. Her last colonoscopy was January 2008. Findings included diverticulosis, no recurrent polyps. Surveillance colonoscopy is due in 2015. Patient has a remote history of esophageal hernia repair and [employer] fundoplication by Dr. Johna Sheriff. Patient is worked in for evaluation of a choking sensation.   Patient has multiple gastrointestinal complaints today. Some of these complaints are chronic, some acute. She had lower back surgery one year ago and since that time she's had a burning sensation in her upper spine. She has also had a choking sensation.The choking sensation is not related to eating. She really describes more of a globus sensation.  Patient works for an E[employer]. Several months ago she apparently had a laryngoscopy and was told the right side of her larynx was irritated. The right side is where she feels something is stuck in her throat. Patient also has chronic nausea, worse over the last few months. Since her cholecystectomy in 2010 patient has battled with postprandial diarrhea. Patient thinks she may have irritable bowel syndrome.    Over the last several days patient has experienced upper abdominal pain with meals. Within a few minutes of eating, she gets epigastric pain, nausea, diaphoresis and urgent diarrhea. Stool does not contain blood . Over the weekend she had some chills. Patient wonders if she picked up a virus somewhere.    Current Medications, Allergies, Past Medical History, Past Surgical History, Family History and Social History were reviewed in [employer] record.   PHYSICAL EXAMINATION : General:  Well developed  female in no acute distress Head: Normocephalic and atraumatic Eyes:  sclerae  anicteric,conjunctive pink. Ears: Normal auditory acuity Neck: Supple, no masses.  Lungs: Clear throughout to auscultation Heart: Regular rate and rhythm Abdomen: Soft, nondistended, mild epigastric tenderness.  No masses noted. Normal bowel sounds Rectal: not done Musculoskeletal: Symmetrical with no gross deformities  Skin: No lesions on visible extremities Extremities: No edema or deformities noted Neurological: Oriented x 4, grossly nonfocal Cervical Nodes:  No significant cervical adenopathy Psychological:  Alert and cooperative. Normal mood and affect  ASSESSMENT AND PLAN :  46.  71 year old female with several upper GI complaints. She gives a one year history of nausea, worse over last several weeks. She has a "choking" sensation, not related to eating. She feels like something is in right side of throat. Several months ago her ENT did what sounds like a laryngoscopy with findings of irritated right larynx.  2. one week history of postprandial epigastric pain associated with diaphoresis and urgent diarrhea. These symptoms may be viral in nature, especially since she has had some associated chills. She feels a little better today after starting Prilosec yesterday. Will give this more time. Await upper endoscopy findings.   3. history of esophageal hernia repair/[employer] fundoplication. Her sensation of choking may actually be globus though would not expect that given history of fundoplication.  4. Acute on chronic postprandial diarrhea.  Suspect viral illness with underlying cholerrheic diarrhea (cholecystectomy) and / or irritable bowel syndrome. However, given her personal history of a carcinoma in situ in a polyp years ago, as well as a family history of colon cancer in her sister, we may need to proceed with colonoscopy if bowel pattern does not return to baseline soon.

## 2012-02-09 NOTE — Progress Notes (Signed)
Reviewed and agree with management. Ikeya Brockel D. Lareta Bruneau, M.D., FACG  

## 2012-03-24 ENCOUNTER — Encounter: Payer: Medicare Other | Admitting: Internal Medicine

## 2012-03-25 ENCOUNTER — Telehealth: Payer: Self-pay | Admitting: Cardiology

## 2012-03-25 ENCOUNTER — Other Ambulatory Visit: Payer: Self-pay | Admitting: Cardiology

## 2012-03-25 NOTE — Telephone Encounter (Signed)
Patient of Dr. Patty Sermons, last seen 08/2010. Will forward to [place].

## 2012-03-25 NOTE — Telephone Encounter (Signed)
Refilled atenolol,crestor and losartan,needs office visit

## 2012-03-25 NOTE — Telephone Encounter (Signed)
[place]:    Called in needing refills of her losartan-hydrochlorothiazide (HYZAAR) 100-25 MG per tablet, rosuvastatin (CRESTOR) 10 MG tablet, atenolol (TENORMIN) 25 MG tablet.  Patient will be leaving town tomorrow and is completely out.

## 2012-03-25 NOTE — Telephone Encounter (Signed)
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

## 2012-03-25 NOTE — Telephone Encounter (Signed)
Left message to call back , needs appointment.

## 2012-03-25 NOTE — Telephone Encounter (Signed)
Scheduled appointment and refills done

## 2012-04-15 ENCOUNTER — Other Ambulatory Visit: Payer: Self-pay | Admitting: Cardiology

## 2012-04-15 DIAGNOSIS — G4700 Insomnia, unspecified: Secondary | ICD-10-CM

## 2012-04-16 ENCOUNTER — Other Ambulatory Visit: Payer: Self-pay | Admitting: Cardiology

## 2012-04-16 DIAGNOSIS — G4700 Insomnia, unspecified: Secondary | ICD-10-CM

## 2012-04-16 NOTE — Telephone Encounter (Signed)
Pt states she called yesterday for refill of Ambien, said she told us she only had one left, which she took last night, would like to get refill called in asap, leaving from work now going home to [place], so just call into [employer] [phone]

## 2012-04-20 MED ORDER — ZOLPIDEM TARTRATE 10 MG PO TABS: 10.0000 mg | ORAL_TABLET | Freq: Every evening | ORAL | Status: DC | PRN

## 2012-04-20 NOTE — Telephone Encounter (Signed)
Refilled as requested  

## 2012-04-21 ENCOUNTER — Other Ambulatory Visit: Payer: Self-pay

## 2012-04-23 ENCOUNTER — Other Ambulatory Visit: Payer: Self-pay | Admitting: Cardiology

## 2012-05-12 ENCOUNTER — Ambulatory Visit: Payer: Medicare Other | Admitting: Cardiology

## 2012-05-21 ENCOUNTER — Ambulatory Visit: Payer: Medicare Other | Admitting: Cardiology

## 2012-05-28 ENCOUNTER — Telehealth: Payer: Self-pay | Admitting: Cardiology

## 2012-05-28 ENCOUNTER — Ambulatory Visit: Payer: Medicare Other | Admitting: Cardiology

## 2012-05-28 DIAGNOSIS — E7800 Pure hypercholesterolemia, unspecified: Secondary | ICD-10-CM

## 2012-05-28 NOTE — Telephone Encounter (Signed)
Scheduled labs and put in orders 

## 2012-05-28 NOTE — Telephone Encounter (Signed)
Pt calling to see if she needs labs for next appt

## 2012-06-14 ENCOUNTER — Other Ambulatory Visit (INDEPENDENT_AMBULATORY_CARE_PROVIDER_SITE_OTHER): Payer: Medicare Other

## 2012-06-14 DIAGNOSIS — E7800 Pure hypercholesterolemia, unspecified: Secondary | ICD-10-CM

## 2012-06-14 LAB — BASIC METABOLIC PANEL
CO2: 31 mEq/L (ref 19–32)
Calcium: 9.6 mg/dL (ref 8.4–10.5)
Creatinine, Ser: 1 mg/dL (ref 0.4–1.2)
GFR: 60.71 mL/min (ref >60.00)
Sodium: 140 mEq/L (ref 135–145)

## 2012-06-14 LAB — LIPID PANEL
Cholesterol: 143 mg/dL (ref 0–200)
HDL: 45.3 mg/dL (ref >39.00)
Total CHOL/HDL Ratio: 3
Triglycerides: 155 mg/dL — ABNORMAL HIGH (ref 0.0–149.0)

## 2012-06-14 LAB — HEPATIC FUNCTION PANEL
ALT: 17 U/L (ref 0–35)
AST: 21 U/L (ref 0–37)
Albumin: 4.2 g/dL (ref 3.5–5.2)
Alkaline Phosphatase: 77 U/L (ref 39–117)
Total Protein: 7.4 g/dL (ref 6.0–8.3)

## 2012-06-14 NOTE — Progress Notes (Signed)
Quick Note:  Please make copy of labs for patient visit. ______ 

## 2012-06-16 ENCOUNTER — Ambulatory Visit (INDEPENDENT_AMBULATORY_CARE_PROVIDER_SITE_OTHER): Payer: Medicare Other | Admitting: Cardiology

## 2012-06-16 ENCOUNTER — Encounter: Payer: Self-pay | Admitting: Cardiology

## 2012-06-16 VITALS — BP 133/62 | HR 85 | Ht 67.0 in | Wt 202.0 lb

## 2012-06-16 DIAGNOSIS — I119 Hypertensive heart disease without heart failure: Secondary | ICD-10-CM | POA: Insufficient documentation

## 2012-06-16 DIAGNOSIS — E785 Hyperlipidemia, unspecified: Secondary | ICD-10-CM | POA: Insufficient documentation

## 2012-06-16 DIAGNOSIS — R7309 Other abnormal glucose: Secondary | ICD-10-CM

## 2012-06-16 DIAGNOSIS — R739 Hyperglycemia, unspecified: Secondary | ICD-10-CM

## 2012-06-16 DIAGNOSIS — E7800 Pure hypercholesterolemia, unspecified: Secondary | ICD-10-CM

## 2012-06-16 MED ORDER — LOSARTAN POTASSIUM-HCTZ 50-12.5 MG PO TABS: 1.0000 | ORAL_TABLET | Freq: Every day | ORAL | Status: DC

## 2012-06-16 MED ORDER — ATENOLOL 25 MG PO TABS: 25.0000 mg | ORAL_TABLET | Freq: Every day | ORAL | Status: DC

## 2012-06-16 MED ORDER — ROSUVASTATIN CALCIUM 10 MG PO TABS: 10.0000 mg | ORAL_TABLET | Freq: Every day | ORAL | Status: DC

## 2012-06-16 NOTE — Patient Instructions (Addendum)
Decrease your Losartan to 50/12.5 mg daily  Your physician wants you to follow-up in: 6 months with fasting labs (lp/bmet/hfp)  You will receive a reminder letter in the mail two months in advance. If you don't receive a letter, please call our office to schedule the follow-up appointment.   Dr Felicity Coyer with [facility] [phone]

## 2012-06-16 NOTE — Progress Notes (Signed)
Laura Chambers Date of Birth:  Mar 21, 1941 [facility] [address] [place], [place]  [address] [phone]  Fax   [phone]  HPI: This pleasant 71 year old woman is seen for a scheduled followup office visit.  She has a past history of essential hypertension and hypercholesterolemia.  He does not have any history of ischemic heart disease.  She was last seen on 09/03/10.  She underwent successful back surgery in August 2012 by Dr. Jordan Likes.  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 She has not been having any chest pain or shortness of breath.  She has been having occasional leg cramps at night and occasionally feels lightheaded.  She notes that her blood pressure has been running a little more than usual. Patient has a history of a hiatal hernia and is on Prilosec OTC.  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 aspirin 81 MG tablet Take 81 mg by mouth daily.      Marland Kitchen atenolol (TENORMIN) 25 MG tablet Take 1 tablet (25 mg total) by mouth daily.  90 tablet  3  . CALCIUM-VITAMIN D PO Take 1 tablet by mouth daily.      . folic acid (FOLVITE) 800 MCG tablet Take 400 mcg by mouth daily.      . Multiple Vitamin (MULTIVITAMIN) tablet Take 1 tablet by mouth daily.      . rosuvastatin (CRESTOR) 10 MG tablet Take 1 tablet (10 mg total) by mouth at bedtime.  30 tablet  11  . zolpidem (AMBIEN) 10 MG tablet Take 1 tablet (10 mg total) by mouth at bedtime as needed for sleep.  30 tablet  3  . [DISCONTINUED] atenolol (TENORMIN) 25 MG tablet TAKE 1 TABLET BY MOUTH ONCE DAILY  90 tablet  0  . [DISCONTINUED] losartan-hydrochlorothiazide (HYZAAR) 100-25 MG per tablet TAKE 1 TABLET BY MOUTH DAILY.  90 tablet  0  . [DISCONTINUED] rosuvastatin (CRESTOR) 10 MG tablet Take 1 tablet (10 mg total) by mouth at bedtime.  30 tablet  11  .  losartan-hydrochlorothiazide (HYZAAR) 50-12.5 MG per tablet Take 1 tablet by mouth daily.  30 tablet  11  . [DISCONTINUED] atenolol (TENORMIN) 25 MG tablet Take 1 tablet (25 mg total) by mouth daily.  90 tablet  3  . [DISCONTINUED] CRESTOR 10 MG tablet TAKE 1 TABLET BY MOUTH ONCE DAILY  90 each  0  . [DISCONTINUED] losartan-hydrochlorothiazide (HYZAAR) 100-25 MG per tablet Take 1 tablet by mouth daily.  90 tablet  3  . [DISCONTINUED] zolpidem (AMBIEN) 10 MG tablet Take 1 tablet (10 mg total) by mouth at bedtime as needed for sleep.  30 tablet  2    Allergies  Allergen Reactions  . Epinephrine   . Trovan (Alatrofloxacin)     Patient Active Problem List  Diagnosis  . Globus sensation  . Nausea  . Bowel habit changes  . Pyrosis  . Hyperglycemia  . Benign hypertensive heart disease without heart failure    History  Smoking status  . Never Smoker   Smokeless tobacco  . Never Used    History  Alcohol Use No    Family History  Problem Relation Age of Onset  . Diabetes Father   . Osteoarthritis Mother   . Colon cancer Sister   . Colon polyps      nephew  . Colon polyps  neice  . Osteoarthritis Father   . Diabetes Sister   . Diabetes Brother   . Heart disease Father   . Stroke Mother   . Stroke Father     Review of Systems: The patient denies any heat or cold intolerance.  No weight gain or weight loss.  The patient denies headaches or blurry vision.  There is no cough or sputum production.  The patient denies dizziness.  There is no hematuria or hematochezia.  The patient denies any muscle aches or arthritis.  The patient denies any rash.  The patient denies frequent falling or instability.  There is no history of depression or anxiety.  All other systems were reviewed and are negative.   Physical Exam: Filed Vitals:   06/16/12 1613  BP: 133/62  Pulse: 85   general appearance feels a well-developed well-nourished woman in no distress.The head and neck exam  reveals pupils equal and reactive.  Extraocular movements are full.  There is no scleral icterus.  The mouth and pharynx are normal.  The neck is supple.  The carotids reveal no bruits.  The jugular venous pressure is normal.  The  thyroid is not enlarged.  There is no lymphadenopathy.  The chest is clear to percussion and auscultation.  There are no rales or rhonchi.  Expansion of the chest is symmetrical.  The precordium is quiet.  The first heart sound is normal.  The second heart sound is physiologically split.  There is no murmur gallop rub or click.  There is no abnormal lift or heave.  The abdomen is soft and nontender.  The bowel sounds are normal.  The liver and spleen are not enlarged.  There are no abdominal masses.  There are no abdominal bruits.  Extremities reveal good pedal pulses.  There is no phlebitis or edema.  There is no cyanosis or clubbing.  Strength is normal and symmetrical in all extremities.  There is no lateralizing weakness.  There are no sensory deficits.  The skin is warm and dry.  There is no rash.  EKG shows normal sinus rhythm and there are nonspecific T-wave flattening consistent with hypokalemia    Assessment / Plan: Continue same medication except reduce dose of losartan HCT as noted above.  Watch diet carefully.  Work on weight loss.  Recheck 6 months for followup office visit lipid panel hepatic function panel and basal metabolic panel.

## 2012-06-16 NOTE — Assessment & Plan Note (Signed)
The patient has a history of dyslipidemia.  She is on Crestor 10 mg daily.  Not been as careful with her diet and her triglycerides are running high.  She will work harder on low carbohydrate weight reduction diet.

## 2012-06-16 NOTE — Assessment & Plan Note (Signed)
The patient's blood pressure has been running a little below for her.  She has been having leg cramps and her potassium is low at 3.4.  We will decrease the strength of her losartan HCT down to 50/12.5 daily.

## 2012-08-19 ENCOUNTER — Encounter (HOSPITAL_COMMUNITY): Payer: Self-pay | Admitting: Pharmacy Technician

## 2012-08-25 ENCOUNTER — Ambulatory Visit (HOSPITAL_COMMUNITY)
Admission: RE | Admit: 2012-08-25 | Discharge: 2012-08-25 | Disposition: A | Discharge status: Home / Self Care | Payer: Medicare Other | Source: Ambulatory Visit | Attending: Orthopedic Surgery | Admitting: Orthopedic Surgery

## 2012-08-25 ENCOUNTER — Encounter (HOSPITAL_COMMUNITY): Payer: Self-pay

## 2012-08-25 ENCOUNTER — Encounter (HOSPITAL_COMMUNITY)
Admission: RE | Admit: 2012-08-25 | Discharge: 2012-08-25 | Disposition: A | Discharge status: Home / Self Care | Payer: Medicare Other | Source: Ambulatory Visit | Attending: Orthopedic Surgery | Admitting: Orthopedic Surgery

## 2012-08-25 DIAGNOSIS — M1610 Unilateral primary osteoarthritis, unspecified hip: Secondary | ICD-10-CM | POA: Insufficient documentation

## 2012-08-25 DIAGNOSIS — M169 Osteoarthritis of hip, unspecified: Secondary | ICD-10-CM | POA: Insufficient documentation

## 2012-08-25 DIAGNOSIS — I10 Essential (primary) hypertension: Secondary | ICD-10-CM | POA: Insufficient documentation

## 2012-08-25 DIAGNOSIS — Z01812 Encounter for preprocedural laboratory examination: Secondary | ICD-10-CM | POA: Insufficient documentation

## 2012-08-25 HISTORY — DX: Adverse effect of unspecified anesthetic, initial encounter: T41.45XA

## 2012-08-25 HISTORY — DX: Polyneuropathy in diseases classified elsewhere: G63

## 2012-08-25 HISTORY — DX: Fibromyalgia: M79.7

## 2012-08-25 HISTORY — DX: Unspecified osteoarthritis, unspecified site: M19.90

## 2012-08-25 LAB — BASIC METABOLIC PANEL
CO2: 28 mEq/L (ref 19–32)
Chloride: 103 mEq/L (ref 96–112)
GFR calc Af Amer: >90 mL/min (ref >90)
Potassium: 4 mEq/L (ref 3.5–5.1)
Sodium: 141 mEq/L (ref 135–145)

## 2012-08-25 LAB — URINALYSIS, ROUTINE W REFLEX MICROSCOPIC
Glucose, UA: NEGATIVE mg/dL
Hgb urine dipstick: NEGATIVE
Ketones, ur: NEGATIVE mg/dL
Protein, ur: NEGATIVE mg/dL
Urobilinogen, UA: 0.2 mg/dL (ref 0.0–1.0)

## 2012-08-25 LAB — APTT: aPTT: 30 seconds (ref 24–37)

## 2012-08-25 LAB — SURGICAL PCR SCREEN: MRSA, PCR: NEGATIVE

## 2012-08-25 LAB — CBC
HCT: 41 % (ref 36.0–46.0)
MCV: 89.9 fL (ref 78.0–100.0)
RBC: 4.56 MIL/uL (ref 3.87–5.11)
WBC: 8.7 10*3/uL (ref 4.0–10.5)

## 2012-08-25 LAB — URINE MICROSCOPIC-ADD ON

## 2012-08-25 LAB — PROTIME-INR: INR: 0.99 (ref 0.00–1.49)

## 2012-08-25 NOTE — Progress Notes (Signed)
Faxed urine with micro to Dr Charlann Boxer through [facility]-  Culture pending

## 2012-08-25 NOTE — Anesthesia Preprocedure Evaluation (Addendum)
Anesthesia Evaluation  Patient identified by MRN, date of birth, ID band Patient awake    Reviewed: Allergy & Precautions, H&P , NPO status , Patient's Chart, lab work & pertinent test results, reviewed documented beta blocker date and time   Airway Mallampati: II TM Distance: >3 FB Neck ROM: full    Dental no notable dental hx. (+) Teeth Intact and Dental Advisory Given   Pulmonary neg pulmonary ROS,  History trachea irritated with cough after Nissen wrap.  She thinks may be intubation problem. breath sounds clear to auscultation  Pulmonary exam normal       Cardiovascular hypertension, Pt. on medications and Pt. on home beta blockers Rhythm:regular Rate:Normal     Neuro/Psych Anxiety Back surgery with fusion negative neurological ROS  negative psych ROS   GI/Hepatic negative GI ROS, Neg liver ROS, hiatal hernia, History nissen wrap   Endo/Other  negative endocrine ROS  Renal/GU negative Renal ROS  negative genitourinary   Musculoskeletal  (+) Fibromyalgia -  Abdominal   Peds  Hematology negative hematology ROS (+)   Anesthesia Other Findings   Reproductive/Obstetrics negative OB ROS                          Anesthesia Physical Anesthesia Plan  ASA: III  Anesthesia Plan: General   Post-op Pain Management:    Induction: Intravenous  Airway Management Planned: Oral ETT  Additional Equipment:   Intra-op Plan:   Post-operative Plan: Extubation in OR  Informed Consent: I have reviewed the patients History and Physical, chart, labs and discussed the procedure including the risks, benefits and alternatives for the proposed anesthesia with the patient or authorized representative who has indicated his/her understanding and acceptance.   Dental Advisory Given  Plan Discussed with: CRNA and Surgeon  Anesthesia Plan Comments: (Perhaps a smaller ETT with history of tracheal irritation  and cough post op)      Anesthesia Quick Evaluation

## 2012-08-25 NOTE — Progress Notes (Signed)
Clearance with note Dr Patty Sermons on chart,  LOV with EKG 12/13  EPIC

## 2012-08-25 NOTE — Patient Instructions (Addendum)
Laura Chambers  08/25/2012   Your procedure is scheduled on:  08/31/12  TUESDAY  Report to [facility] Stay Center at   1120    AM.  Call this number if you have problems the morning of surgery: [phone]       Remember:   Do not eat food:After Midnight Monday NIGHT-   MAY HAVE CLEAR LIQUIDS Tuesday MORNING- NO MILK PRODUCTS- UNTIL 0820 AM,  THEN NOTHING BY MOUTH.   Take these medicines the morning of surgery with A SIP OF WATER:none   .  Contacts, dentures or partial plates can not be worn to surgery  Leave suitcase in the car. After surgery it may be brought to your room.  For patients admitted to the hospital, checkout time is 11:00 AM day of  discharge.             SPECIAL INSTRUCTIONS- SEE [facility] PREPARING FOR SURGERY INSTRUCTION SHEET-     DO NOT WEAR JEWELRY, LOTIONS, POWDERS, OR PERFUMES.  WOMEN-- DO NOT SHAVE LEGS OR UNDERARMS FOR 12 HOURS BEFORE SHOWERS. MEN MAY SHAVE FACE.  Patients discharged the day of surgery will not be allowed to drive home. IF going home the day of surgery, you must have a driver and someone to stay with you for the first 24 hours  Name and phone number of your driver: admission                                                                       Please read over the following fact sheets that you were given: MRSA Information, Incentive Spirometry Sheet, Blood Transfusion Sheet  Information                                                                                   [name]  PST [phone]  [phone]                 FAILURE TO FOLLOW THESE INSTRUCTIONS MAY RESULT IN  CANCELLATION   OF YOUR SURGERY                                                  Patient Signature _____________________________

## 2012-08-26 LAB — URINE CULTURE

## 2012-08-27 NOTE — Progress Notes (Signed)
On 08/26/12 received fax from Dr Boneta Lucks office- no action re labs.  Faxed urine culture report 08/27/12 to Dr Charlann Boxer through [facility]

## 2012-08-29 NOTE — H&P (Signed)
TOTAL HIP ADMISSION H&P  Patient is admitted for right total hip arthroplasty, anterior approach.  Subjective:  Chief Complaint:   Right hip OA / pain  HPI: Laura Chambers, 72 y.o. female, has a history of pain and functional disability in the right hip(s) due to arthritis and patient has failed non-surgical conservative treatments for greater than 12 weeks to include NSAID's and/or analgesics, corticosteriod injections and activity modification.  Onset of symptoms was gradual starting 5 years ago with gradually worsening course since that time.The patient noted no past surgery on the right hip(s).  Patient currently rates pain in the right hip at 9 out of 10 with activity. Patient has night pain, worsening of pain with activity and weight bearing, trendelenberg gait, pain that interfers with activities of daily living and pain with passive range of motion. Patient has evidence of periarticular osteophytes and joint space narrowing by imaging studies. This condition presents safety issues increasing the risk of falls. There is no current active infection.  Risks, benefits and expectations were discussed with the patient. Patient understand the risks, benefits and expectations and wishes to proceed with surgery.   D/C Plans:   Home with HHPT  Post-op Meds:   Rx given for ASA, Zanaflex, Iron, Colace and MiraLax  Tranexamic Acid:   To be given  Decadron:    To be given  FYI:   Nothing to note   Patient Active Problem List   Diagnosis Date Noted  . Hyperglycemia 06/16/2012  . 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 Dyslipidemia 06/16/2012  . Globus sensation 02/04/2012  . Nausea 02/04/2012  . Bowel habit changes 02/04/2012  . Pyrosis 02/04/2012   Past Medical History  Diagnosis Date  . Colon cancer   . HTN (hypertension)   . Hypercholesterolemia   . Obesity   . Hx of colonic polyps   . Renal angiomyolipoma   . Anxiety   . Hydronephrosis of right  kidney   . Esophageal hernia   . 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 Arthritis   . Fibromyalgia   . Complication of anesthesia 2012    "irritated trachea" with cough x 1 year from intubation    Past Surgical History  Procedure Laterality Date  . Total knee arthroplasty  2001    left  . Cholecystectomy  2010  . Tonsillectomy  1946  . Abdominal hysterectomy  1969  . Paraesophageal hernia repair  1989  . Breast biopsy  1989  . Nissen fundoplication    . Total knee arthroplasty      right  . Blephoroplasty      bilateral  . Back surgery  2012    rod in back-  lumbar disc removal with bone graft    Allergies  Allergen Reactions  . Epinephrine Other (See Comments)    In surgery, and shoulder started jerking and bp got high  . Levaquin (Levofloxacin In D5w) Nausea And Vomiting  . Trovan (Alatrofloxacin) Nausea And Vomiting    pancreatitis    History  Substance Use Topics  . Smoking status: Never Smoker   . Smokeless tobacco: Never Used  . Alcohol Use: No    Family History  Problem Relation Age of Onset  . Diabetes Father   . Osteoarthritis Mother   . Colon cancer Sister   . Colon polyps      nephew  . Colon polyps      neice  .  Osteoarthritis Father   . Diabetes Sister   . Diabetes Brother   . Heart disease Father   . Stroke Mother   . Stroke Father      Review of Systems  Constitutional: Positive for malaise/fatigue.  HENT: Negative.   Eyes: Negative.   Respiratory: Negative.   Cardiovascular: Negative.   Gastrointestinal: Negative.   Genitourinary: Positive for urgency and frequency.  Musculoskeletal: Positive for myalgias, back pain and joint pain.  Skin: Negative.   Neurological: Negative.   Endo/Heme/Allergies: Negative.   Psychiatric/Behavioral: The patient has insomnia.     Objective:  Physical Exam  Constitutional: She is oriented to person, place, and time. She appears well-developed and well-nourished.  HENT:   Head: Normocephalic and atraumatic.  Mouth/Throat: Oropharynx is clear and moist.  Eyes: Pupils are equal, round, and reactive to light.  Neck: Neck supple. No JVD present. No tracheal deviation present. No thyromegaly present.  Cardiovascular: Normal rate, regular rhythm, normal heart sounds and intact distal pulses.   Respiratory: Effort normal and breath sounds normal. No stridor. No respiratory distress. She has no wheezes.  GI: Soft. There is no tenderness. There is no guarding.  Musculoskeletal:       Right hip: She exhibits decreased range of motion, decreased strength, tenderness and bony tenderness. She exhibits no swelling, no deformity and no laceration.  Lymphadenopathy:    She has no cervical adenopathy.  Neurological: She is alert and oriented to person, place, and time.  Skin: Skin is warm and dry.  Psychiatric: She has a normal mood and affect.    Labs:  Estimated body mass index is 31.63 kg/(m^2) as calculated from the following:   Height as of 06/16/12: 5\' 7"  (1.702 m).   Weight as of 06/16/12: 91.627 kg (202 lb).   Imaging Review Plain radiographs demonstrate moderate degenerative joint disease of the right hip(s). The bone quality appears to be good for age and reported activity level.  Assessment/Plan:  End stage arthritis, right hip(s)  The patient history, physical examination, clinical judgement of the provider and imaging studies are consistent with end stage degenerative joint disease of the right hip(s) and total hip arthroplasty is deemed medically necessary. The treatment options including medical management, injection therapy, arthroscopy and arthroplasty were discussed at length. The risks and benefits of total hip arthroplasty were presented and reviewed. The risks due to aseptic loosening, infection, stiffness, dislocation/subluxation,  thromboembolic complications and other imponderables were discussed.  The patient acknowledged the explanation,  agreed to proceed with the plan and consent was signed. Patient is being admitted for inpatient treatment for surgery, pain control, PT, OT, prophylactic antibiotics, VTE prophylaxis, progressive ambulation and ADL's and discharge planning.The patient is planning to be discharged home with home health services.    Anastasio Auerbach Diamond Martucci   PAC  08/29/2012, 7:09 PM

## 2012-08-31 ENCOUNTER — Inpatient Hospital Stay (HOSPITAL_COMMUNITY): Payer: Medicare Other | Admitting: Anesthesiology

## 2012-08-31 ENCOUNTER — Inpatient Hospital Stay (HOSPITAL_COMMUNITY)
Admission: RE | Admit: 2012-08-31 | Discharge: 2012-09-02 | DRG: 470 | Disposition: A | Discharge status: Home Health | Payer: Medicare Other | Source: Ambulatory Visit | Attending: Orthopedic Surgery | Admitting: Orthopedic Surgery

## 2012-08-31 ENCOUNTER — Encounter (HOSPITAL_COMMUNITY)
Admission: RE | Disposition: A | Discharge status: Home Health | Payer: Self-pay | Source: Ambulatory Visit | Attending: Orthopedic Surgery

## 2012-08-31 ENCOUNTER — Encounter (HOSPITAL_COMMUNITY): Payer: Self-pay | Admitting: Anesthesiology

## 2012-08-31 ENCOUNTER — Encounter (HOSPITAL_COMMUNITY): Payer: Self-pay | Admitting: *Deleted

## 2012-08-31 ENCOUNTER — Inpatient Hospital Stay (HOSPITAL_COMMUNITY): Payer: Medicare Other

## 2012-08-31 DIAGNOSIS — Z85038 Personal history of other malignant neoplasm of large intestine: Secondary | ICD-10-CM

## 2012-08-31 DIAGNOSIS — F411 Generalized anxiety disorder: Secondary | ICD-10-CM | POA: Diagnosis present

## 2012-08-31 DIAGNOSIS — G5790 Unspecified mononeuropathy of unspecified lower limb: Secondary | ICD-10-CM | POA: Diagnosis present

## 2012-08-31 DIAGNOSIS — Z6831 Body mass index (BMI) 31.0-31.9, adult: Secondary | ICD-10-CM

## 2012-08-31 DIAGNOSIS — IMO0001 Reserved for inherently not codable concepts without codable children: Secondary | ICD-10-CM | POA: Diagnosis present

## 2012-08-31 DIAGNOSIS — M1610 Unilateral primary osteoarthritis, unspecified hip: Principal | ICD-10-CM | POA: Diagnosis present

## 2012-08-31 DIAGNOSIS — I10 Essential (primary) hypertension: Secondary | ICD-10-CM | POA: Diagnosis present

## 2012-08-31 DIAGNOSIS — M169 Osteoarthritis of hip, unspecified: Principal | ICD-10-CM | POA: Diagnosis present

## 2012-08-31 DIAGNOSIS — E785 Hyperlipidemia, unspecified: Secondary | ICD-10-CM | POA: Diagnosis present

## 2012-08-31 HISTORY — PX: TOTAL HIP ARTHROPLASTY: SHX124

## 2012-08-31 LAB — TYPE AND SCREEN
ABO/RH(D): O POS
Antibody Screen: NEGATIVE

## 2012-08-31 SURGERY — ARTHROPLASTY, HIP, TOTAL, ANTERIOR APPROACH
Anesthesia: General | Site: Hip | Laterality: Right | Wound class: Clean

## 2012-08-31 MED ORDER — GLYCOPYRROLATE 0.2 MG/ML IJ SOLN
INTRAMUSCULAR | Status: DC | PRN
  Administered 2012-08-31: .5 mg via INTRAVENOUS

## 2012-08-31 MED ORDER — HYDROMORPHONE HCL PF 1 MG/ML IJ SOLN
INTRAMUSCULAR | Status: DC | PRN
  Administered 2012-08-31 (×2): 1 mg via INTRAVENOUS

## 2012-08-31 MED ORDER — CEFAZOLIN SODIUM-DEXTROSE 2-3 GM-% IV SOLR
2.0000 g | INTRAVENOUS | Status: AC
  Administered 2012-08-31: 2 g via INTRAVENOUS

## 2012-08-31 MED ORDER — SODIUM CHLORIDE 0.9 % IV SOLN
INTRAVENOUS | Status: DC
  Administered 2012-08-31: 22:00:00 via INTRAVENOUS
  Filled 2012-08-31 (×6): qty 1000

## 2012-08-31 MED ORDER — METOCLOPRAMIDE HCL 10 MG PO TABS: 5.0000 mg | ORAL_TABLET | Freq: Three times a day (TID) | ORAL | Status: DC | PRN

## 2012-08-31 MED ORDER — RIVAROXABAN 10 MG PO TABS
10.0000 mg | ORAL_TABLET | Freq: Every day | ORAL | Status: DC
  Administered 2012-09-01 – 2012-09-02 (×2): 10 mg via ORAL
  Filled 2012-08-31 (×3): qty 1

## 2012-08-31 MED ORDER — MEPERIDINE HCL 50 MG/ML IJ SOLN: 6.2500 mg | INTRAMUSCULAR | Status: DC | PRN

## 2012-08-31 MED ORDER — LIDOCAINE HCL (PF) 2 % IJ SOLN
INTRAMUSCULAR | Status: DC | PRN
  Administered 2012-08-31: 20 mg

## 2012-08-31 MED ORDER — HYDROMORPHONE HCL PF 1 MG/ML IJ SOLN: 0.5000 mg | INTRAMUSCULAR | Status: DC | PRN

## 2012-08-31 MED ORDER — ONDANSETRON HCL 4 MG/2ML IJ SOLN: 4.0000 mg | Freq: Four times a day (QID) | INTRAMUSCULAR | Status: DC | PRN

## 2012-08-31 MED ORDER — FENTANYL CITRATE 0.05 MG/ML IJ SOLN
INTRAMUSCULAR | Status: DC | PRN
  Administered 2012-08-31: 100 ug via INTRAVENOUS

## 2012-08-31 MED ORDER — EPHEDRINE SULFATE 50 MG/ML IJ SOLN
INTRAMUSCULAR | Status: DC | PRN
  Administered 2012-08-31: 5 mg via INTRAVENOUS

## 2012-08-31 MED ORDER — PROMETHAZINE HCL 25 MG/ML IJ SOLN: 6.2500 mg | INTRAMUSCULAR | Status: DC | PRN

## 2012-08-31 MED ORDER — LACTATED RINGERS IV SOLN: INTRAVENOUS | Status: DC

## 2012-08-31 MED ORDER — TRANEXAMIC ACID 100 MG/ML IV SOLN
1000.0000 mg | Freq: Once | INTRAVENOUS | Status: AC
  Administered 2012-08-31: 1000 mg via INTRAVENOUS
  Filled 2012-08-31: qty 10

## 2012-08-31 MED ORDER — ROCURONIUM BROMIDE 100 MG/10ML IV SOLN
INTRAVENOUS | Status: DC | PRN
  Administered 2012-08-31: 40 mg via INTRAVENOUS

## 2012-08-31 MED ORDER — MIDAZOLAM HCL 5 MG/5ML IJ SOLN
INTRAMUSCULAR | Status: DC | PRN
  Administered 2012-08-31: 2 mg via INTRAVENOUS

## 2012-08-31 MED ORDER — FOLIC ACID 800 MCG PO TABS: 400.0000 ug | ORAL_TABLET | Freq: Every day | ORAL | Status: DC

## 2012-08-31 MED ORDER — LOSARTAN POTASSIUM-HCTZ 50-12.5 MG PO TABS: 1.0000 | ORAL_TABLET | Freq: Every evening | ORAL | Status: DC

## 2012-08-31 MED ORDER — METHOCARBAMOL 100 MG/ML IJ SOLN
500.0000 mg | Freq: Four times a day (QID) | INTRAVENOUS | Status: DC | PRN
  Filled 2012-08-31: qty 5

## 2012-08-31 MED ORDER — HYDROCHLOROTHIAZIDE 12.5 MG PO CAPS
12.5000 mg | ORAL_CAPSULE | Freq: Every day | ORAL | Status: DC
  Administered 2012-09-01: 12.5 mg via ORAL
  Filled 2012-08-31 (×3): qty 1

## 2012-08-31 MED ORDER — METHOCARBAMOL 500 MG PO TABS
500.0000 mg | ORAL_TABLET | Freq: Four times a day (QID) | ORAL | Status: DC | PRN
  Administered 2012-08-31 – 2012-09-02 (×4): 500 mg via ORAL
  Filled 2012-08-31 (×5): qty 1

## 2012-08-31 MED ORDER — FOLIC ACID 0.5 MG HALF TAB
0.5000 mg | ORAL_TABLET | Freq: Every day | ORAL | Status: DC
  Administered 2012-09-01 – 2012-09-02 (×2): 0.5 mg via ORAL
  Filled 2012-08-31 (×3): qty 1

## 2012-08-31 MED ORDER — ACETAMINOPHEN 650 MG RE SUPP: 650.0000 mg | Freq: Four times a day (QID) | RECTAL | Status: DC | PRN

## 2012-08-31 MED ORDER — LIDOCAINE HCL 4 % MT SOLN
OROMUCOSAL | Status: DC | PRN
  Administered 2012-08-31: 4 mL via TOPICAL

## 2012-08-31 MED ORDER — STERILE WATER FOR IRRIGATION IR SOLN
Status: DC | PRN
  Administered 2012-08-31: 3000 mL

## 2012-08-31 MED ORDER — CEFAZOLIN SODIUM 1-5 GM-% IV SOLN
1.0000 g | Freq: Four times a day (QID) | INTRAVENOUS | Status: AC
  Administered 2012-08-31 – 2012-09-01 (×2): 1 g via INTRAVENOUS
  Filled 2012-08-31 (×2): qty 50

## 2012-08-31 MED ORDER — HYDROCODONE-ACETAMINOPHEN 7.5-325 MG PO TABS
1.0000 | ORAL_TABLET | Freq: Four times a day (QID) | ORAL | Status: DC
  Administered 2012-08-31 – 2012-09-02 (×4): 1 via ORAL
  Filled 2012-08-31 (×4): qty 1

## 2012-08-31 MED ORDER — 0.9 % SODIUM CHLORIDE (POUR BTL) OPTIME
TOPICAL | Status: DC | PRN
  Administered 2012-08-31: 1000 mL

## 2012-08-31 MED ORDER — PROPOFOL 10 MG/ML IV BOLUS
INTRAVENOUS | Status: DC | PRN
  Administered 2012-08-31: 150 mg via INTRAVENOUS

## 2012-08-31 MED ORDER — PHENYLEPHRINE HCL 10 MG/ML IJ SOLN
INTRAMUSCULAR | Status: DC | PRN
  Administered 2012-08-31 (×3): 40 ug via INTRAVENOUS

## 2012-08-31 MED ORDER — POLYETHYLENE GLYCOL 3350 17 G PO PACK
17.0000 g | PACK | Freq: Every day | ORAL | Status: DC
  Administered 2012-09-02: 17 g via ORAL

## 2012-08-31 MED ORDER — LOSARTAN POTASSIUM 50 MG PO TABS
50.0000 mg | ORAL_TABLET | Freq: Every day | ORAL | Status: DC
  Administered 2012-09-01: 50 mg via ORAL
  Filled 2012-08-31 (×3): qty 1

## 2012-08-31 MED ORDER — ONE-DAILY MULTI VITAMINS PO TABS
1.0000 | ORAL_TABLET | Freq: Every day | ORAL | Status: DC
Start: 2012-08-31 — End: 2012-08-31

## 2012-08-31 MED ORDER — NEOSTIGMINE METHYLSULFATE 1 MG/ML IJ SOLN
INTRAMUSCULAR | Status: DC | PRN
  Administered 2012-08-31: 3 mg via INTRAVENOUS

## 2012-08-31 MED ORDER — DEXAMETHASONE SODIUM PHOSPHATE 10 MG/ML IJ SOLN
10.0000 mg | Freq: Once | INTRAMUSCULAR | Status: DC
  Filled 2012-08-31: qty 1

## 2012-08-31 MED ORDER — ACETAMINOPHEN 10 MG/ML IV SOLN
INTRAVENOUS | Status: DC | PRN
  Administered 2012-08-31: 1000 mg via INTRAVENOUS

## 2012-08-31 MED ORDER — ONDANSETRON HCL 4 MG/2ML IJ SOLN
INTRAMUSCULAR | Status: DC | PRN
  Administered 2012-08-31: 4 mg via INTRAVENOUS

## 2012-08-31 MED ORDER — PREGABALIN 50 MG PO CAPS
50.0000 mg | ORAL_CAPSULE | Freq: Every day | ORAL | Status: DC
  Administered 2012-08-31 – 2012-09-01 (×2): 50 mg via ORAL
  Filled 2012-08-31 (×2): qty 1

## 2012-08-31 MED ORDER — MENTHOL 3 MG MT LOZG: 1.0000 | LOZENGE | OROMUCOSAL | Status: DC | PRN

## 2012-08-31 MED ORDER — PHENOL 1.4 % MT LIQD: 1.0000 | OROMUCOSAL | Status: DC | PRN

## 2012-08-31 MED ORDER — LACTATED RINGERS IV SOLN
INTRAVENOUS | Status: DC
  Administered 2012-08-31: 1000 mL via INTRAVENOUS
  Administered 2012-08-31: 18:00:00 via INTRAVENOUS

## 2012-08-31 MED ORDER — METOCLOPRAMIDE HCL 5 MG/ML IJ SOLN: 5.0000 mg | Freq: Three times a day (TID) | INTRAMUSCULAR | Status: DC | PRN

## 2012-08-31 MED ORDER — DEXAMETHASONE SODIUM PHOSPHATE 10 MG/ML IJ SOLN
10.0000 mg | Freq: Once | INTRAMUSCULAR | Status: AC
  Administered 2012-08-31: 10 mg via INTRAVENOUS

## 2012-08-31 MED ORDER — ATORVASTATIN CALCIUM 20 MG PO TABS
20.0000 mg | ORAL_TABLET | Freq: Every day | ORAL | Status: DC
  Administered 2012-09-01: 20 mg via ORAL
  Filled 2012-08-31 (×3): qty 1

## 2012-08-31 MED ORDER — FERROUS SULFATE 325 (65 FE) MG PO TABS
325.0000 mg | ORAL_TABLET | Freq: Three times a day (TID) | ORAL | Status: DC
  Administered 2012-09-01 – 2012-09-02 (×4): 325 mg via ORAL
  Filled 2012-08-31 (×7): qty 1

## 2012-08-31 MED ORDER — ADULT MULTIVITAMIN W/MINERALS CH
1.0000 | ORAL_TABLET | Freq: Every day | ORAL | Status: DC
  Administered 2012-09-01 – 2012-09-02 (×2): 1 via ORAL
  Filled 2012-08-31 (×3): qty 1

## 2012-08-31 MED ORDER — ACETAMINOPHEN 325 MG PO TABS: 650.0000 mg | ORAL_TABLET | Freq: Four times a day (QID) | ORAL | Status: DC | PRN

## 2012-08-31 MED ORDER — DOCUSATE SODIUM 100 MG PO CAPS
100.0000 mg | ORAL_CAPSULE | Freq: Two times a day (BID) | ORAL | Status: DC
  Administered 2012-08-31 – 2012-09-02 (×4): 100 mg via ORAL

## 2012-08-31 MED ORDER — HYDROMORPHONE HCL PF 1 MG/ML IJ SOLN
0.2500 mg | INTRAMUSCULAR | Status: DC | PRN
  Administered 2012-08-31 (×2): 0.5 mg via INTRAVENOUS

## 2012-08-31 MED ORDER — ATENOLOL 25 MG PO TABS
25.0000 mg | ORAL_TABLET | Freq: Every day | ORAL | Status: DC
  Administered 2012-09-01: 25 mg via ORAL
  Filled 2012-08-31 (×3): qty 1

## 2012-08-31 MED ORDER — ONDANSETRON HCL 4 MG PO TABS
4.0000 mg | ORAL_TABLET | Freq: Four times a day (QID) | ORAL | Status: DC | PRN
  Administered 2012-09-02: 4 mg via ORAL
  Filled 2012-08-31: qty 1

## 2012-08-31 SURGICAL SUPPLY — 39 items
ADH SKN CLS APL DERMABOND .7 (GAUZE/BANDAGES/DRESSINGS)
BAG SPEC THK2 15X12 ZIP CLS (MISCELLANEOUS) ×2
BAG ZIPLOCK 12X15 (MISCELLANEOUS) ×4 IMPLANT
BLADE SAW SGTL 18X1.27X75 (BLADE) ×2 IMPLANT
CLOTH BEACON ORANGE TIMEOUT ST (SAFETY) ×2 IMPLANT
DERMABOND ADVANCED (GAUZE/BANDAGES/DRESSINGS)
DERMABOND ADVANCED .7 DNX12 (GAUZE/BANDAGES/DRESSINGS) ×1 IMPLANT
DRAPE C-ARM 42X72 X-RAY (DRAPES) ×4 IMPLANT
DRAPE STERI IOBAN 125X83 (DRAPES) ×2 IMPLANT
DRAPE U-SHAPE 47X51 STRL (DRAPES) ×6 IMPLANT
DRSG AQUACEL AG ADV 3.5X10 (GAUZE/BANDAGES/DRESSINGS) ×1 IMPLANT
DRSG TEGADERM 4X4.75 (GAUZE/BANDAGES/DRESSINGS) ×1 IMPLANT
DURAPREP 26ML APPLICATOR (WOUND CARE) ×2 IMPLANT
ELECT BLADE TIP CTD 4 INCH (ELECTRODE) ×2 IMPLANT
ELECT REM PT RETURN 9FT ADLT (ELECTROSURGICAL) ×2
ELECTRODE REM PT RTRN 9FT ADLT (ELECTROSURGICAL) ×1 IMPLANT
EVACUATOR 1/8 PVC DRAIN (DRAIN) ×1 IMPLANT
FACESHIELD LNG OPTICON STERILE (SAFETY) ×8 IMPLANT
GAUZE SPONGE 2X2 8PLY STRL LF (GAUZE/BANDAGES/DRESSINGS) ×1 IMPLANT
GLOVE BIOGEL PI IND STRL 7.5 (GLOVE) ×1 IMPLANT
GLOVE BIOGEL PI IND STRL 8 (GLOVE) ×1 IMPLANT
GLOVE BIOGEL PI INDICATOR 7.5 (GLOVE) ×1
GLOVE BIOGEL PI INDICATOR 8 (GLOVE) ×1
GLOVE ECLIPSE 8.0 STRL XLNG CF (GLOVE) ×2 IMPLANT
GLOVE ORTHO TXT STRL SZ7.5 (GLOVE) ×4 IMPLANT
GOWN BRE IMP PREV XXLGXLNG (GOWN DISPOSABLE) ×2 IMPLANT
GOWN STRL NON-REIN LRG LVL3 (GOWN DISPOSABLE) ×2 IMPLANT
KIT BASIN OR (CUSTOM PROCEDURE TRAY) ×2 IMPLANT
PACK TOTAL JOINT (CUSTOM PROCEDURE TRAY) ×2 IMPLANT
PADDING CAST COTTON 6X4 STRL (CAST SUPPLIES) ×2 IMPLANT
SPONGE GAUZE 2X2 STER 10/PKG (GAUZE/BANDAGES/DRESSINGS)
SUCTION FRAZIER 12FR DISP (SUCTIONS) ×2 IMPLANT
SUT MNCRL AB 4-0 PS2 18 (SUTURE) ×2 IMPLANT
SUT VIC AB 1 CT1 36 (SUTURE) ×8 IMPLANT
SUT VIC AB 2-0 CT1 27 (SUTURE) ×4
SUT VIC AB 2-0 CT1 TAPERPNT 27 (SUTURE) ×2 IMPLANT
SUT VLOC 180 0 24IN GS25 (SUTURE) ×2 IMPLANT
TOWEL OR 17X26 10 PK STRL BLUE (TOWEL DISPOSABLE) ×4 IMPLANT
TRAY FOLEY CATH 14FRSI W/METER (CATHETERS) ×2 IMPLANT

## 2012-08-31 NOTE — Op Note (Signed)
NAME:  Laura Chambers                ACCOUNT NO.: [id number]      MEDICAL RECORD NO.: [id number]      FACILITY:  [facility]      PHYSICIAN:  Durene Romans D  DATE OF BIRTH:  [birth year]     DATE OF PROCEDURE:  08/31/2012                                 OPERATIVE REPORT         PREOPERATIVE DIAGNOSIS: Right  hip osteoarthritis.      POSTOPERATIVE DIAGNOSIS:  Right hip osteoarthritis.      PROCEDURE:  Right total hip replacement through an anterior approach   utilizing DePuy THR system, component size 54mm pinnacle cup, a size 36+4 neutral   Altrex liner, a size 2 Hi Tri Lock stem with a 36+5 delta ceramic   ball.      SURGEON:  Madlyn Frankel. Charlann Boxer, M.D.      ASSISTANT:  Leilani Able, PA-C      ANESTHESIA:  General.      SPECIMENS:  None.      COMPLICATIONS:  None.      BLOOD LOSS:  450 cc     DRAINS:  One Hemovac.      INDICATION OF THE PROCEDURE:  Laura Chambers is a 72 y.o. female who had   presented to office for evaluation of right hip pain.  Radiographs revealed   progressive degenerative changes with bone-on-bone   articulation to the  hip joint.  The patient had painful limited range of   motion significantly affecting their overall quality of life.  The patient was failing to    respond to conservative measures, and at this point was ready   to proceed with more definitive measures.  The patient has noted progressive   degenerative changes in his hip, progressive problems and dysfunction   with regarding the hip prior to surgery.  Consent was obtained for   benefit of pain relief.  Specific risk of infection, DVT, component   failure, dislocation, need for revision surgery, as well discussion of   the anterior versus posterior approach were reviewed.  Consent was   obtained for benefit of anterior pain relief through an anterior   approach.      PROCEDURE IN DETAIL:  The patient was brought to operative theater.   Once adequate  anesthesia, preoperative antibiotics, 2gm Ancef administered.   The patient was positioned supine on the OSI Hanna table.  Once adequate   padding of boney process was carried out, we had predraped out the hip, and  used fluoroscopy to confirm orientation of the pelvis and position.      The right hip was then prepped and draped from proximal iliac crest to   mid thigh with shower curtain technique.      Time-out was performed identifying the patient, planned procedure, and   extremity.     An incision was then made 2 cm distal and lateral to the   anterior superior iliac spine extending over the orientation of the   tensor fascia lata muscle and sharp dissection was carried down to the   fascia of the muscle and protractor placed in the soft tissues.      The fascia was then incised.  The muscle belly was identified and  swept   laterally and retractor placed along the superior neck.  Following   cauterization of the circumflex vessels and removing some pericapsular   fat, a second cobra retractor was placed on the inferior neck.  A third   retractor was placed on the anterior acetabulum after elevating the   anterior rectus.  A L-capsulotomy was along the line of the   superior neck to the trochanteric fossa, then extended proximally and   distally.  Tag sutures were placed and the retractors were then placed   intracapsular.  We then identified the trochanteric fossa and   orientation of my neck cut, confirmed this radiographically   and then made a neck osteotomy with the femur on traction.  The femoral   head was removed without difficulty or complication.  Traction was let   off and retractors were placed posterior and anterior around the   acetabulum.      The labrum and foveal tissue were debrided.  I began reaming with a 47mm   reamer and reamed up to 53mm reamer with good bony bed preparation and a 54   cup was chosen.  The final 54mm Pinnacle cup was then impacted under  fluoroscopy  to confirm the depth of penetration and orientation with respect to   abduction.  A screw was placed followed by the hole eliminator.  The final   36+4 neutral Altrex liner was impacted with good visualized rim fit.  The cup was positioned anatomically within the acetabular portion of the pelvis.      At this point, the femur was rolled at 80 degrees.  Further capsule was   released off the inferior aspect of the femoral neck.  I then   released the superior capsule proximally.  The hook was placed laterally   along the femur and elevated manually and held in position with the bed   hook.  The leg was then extended and adducted with the leg rolled to 100   degrees of external rotation.  Once the proximal femur was fully   exposed, I used a box osteotome to set orientation.  I then began   broaching with the starting chili pepper broach and passed this by hand and then broached up to 2.  With the 2 broach in place I chose a high offset neck and did a trial reduction first with a 36+1.5 then +5 ball.  With the 36+5 ball the offset was appropriate, leg lengths   appeared to be equal to her pre-operative xrays, confirmed radiographically.   Given these findings, I went ahead and dislocated the hip, repositioned all   retractors and positioned the right hip in the extended and abducted position.  The final 2Hi Tri Lock stem was   chosen and it was impacted down to the level of neck cut.  Based on this   and the trial reduction, a 36+5 delta ceramic ball was chosen and   impacted onto a clean and dry trunnion, and the hip was reduced.  The   hip had been irrigated throughout the case again at this point.  I did   reapproximate the superior capsular leaflet to the anterior leaflet   using #1 Vicryl, placed a medium Hemovac drain deep.  The fascia of the   tensor fascia lata muscle was then reapproximated using #1 Vicryl.  The   remaining wound was closed with 2-0 Vicryl and running 4-0  Monocryl.   The hip was cleaned, dried, and  dressed sterilely using Dermabond and   Aquacel dressing.  Drain site dressed separately.  She was then brought   to recovery room in stable condition tolerating the procedure well.    Leilani Able, PA-C was present for the entirety of the case involved from   preoperative positioning, perioperative retractor management, general   facilitation of the case, as well as primary wound closure as assistant.            Madlyn Frankel Charlann Boxer, M.D.            MDO/MEDQ  D:  04/29/2011  T:  04/29/2011  Job:  [id number]      Electronically Signed by Durene Romans M.D. on 05/05/2011 09:15:38 AM

## 2012-08-31 NOTE — Interval H&P Note (Signed)
History and Physical Interval Note:  08/31/2012 1:22 PM  Laura Chambers  has presented today for surgery, with the diagnosis of Right Hip Osteoarthritis  The various methods of treatment have been discussed with the patient and family. After consideration of risks, benefits and other options for treatment, the patient has consented to  Procedure(s): TOTAL HIP ARTHROPLASTY ANTERIOR APPROACH (Right) as a surgical intervention .  The patient's history has been reviewed, patient examined, no change in status, stable for surgery.  I have reviewed the patient's chart and labs.  Questions were answered to the patient's satisfaction.     Shelda Pal

## 2012-08-31 NOTE — Anesthesia Postprocedure Evaluation (Signed)
  Anesthesia Post-op Note  Patient: Laura Chambers  Procedure(s) Performed: Procedure(s): TOTAL HIP ARTHROPLASTY ANTERIOR APPROACH (Right)  Patient Location: PACU  Anesthesia Type:General  Level of Consciousness: awake, alert , oriented and patient cooperative  Airway and Oxygen Therapy: Patient Spontanous Breathing and Patient connected to nasal cannula oxygen  Post-op Pain: mild  Post-op Assessment: Post-op Vital signs reviewed, Patient's Cardiovascular Status Stable, Respiratory Function Stable, Patent Airway, No signs of Nausea or vomiting, Adequate PO intake and Pain level controlled  Post-op Vital Signs: Reviewed and stable  Complications: No apparent anesthesia complications

## 2012-08-31 NOTE — Transfer of Care (Signed)
Immediate Anesthesia Transfer of Care Note  Patient: Laura Chambers  Procedure(s) Performed: Procedure(s) (LRB): TOTAL HIP ARTHROPLASTY ANTERIOR APPROACH (Right)  Patient Location: PACU  Anesthesia Type: General  Level of Consciousness: sedated, patient cooperative and responds to stimulaton  Airway & Oxygen Therapy: Patient Spontanous Breathing and Patient connected to face mask oxgen  Post-op Assessment: Report given to PACU RN and Post -op Vital signs reviewed and stable  Post vital signs: Reviewed and stable  Complications: No apparent anesthesia complications

## 2012-09-01 LAB — CBC
HCT: 35.1 % — ABNORMAL LOW (ref 36.0–46.0)
Hemoglobin: 11.5 g/dL — ABNORMAL LOW (ref 12.0–15.0)
MCH: 29.5 pg (ref 26.0–34.0)
MCHC: 32.8 g/dL (ref 30.0–36.0)

## 2012-09-01 LAB — BASIC METABOLIC PANEL
BUN: 18 mg/dL (ref 6–23)
Calcium: 8.4 mg/dL (ref 8.4–10.5)
GFR calc non Af Amer: 84 mL/min — ABNORMAL LOW (ref >90)
Glucose, Bld: 164 mg/dL — ABNORMAL HIGH (ref 70–99)

## 2012-09-01 NOTE — Care Management Note (Signed)
    Page 1 of 2   09/01/2012     12:10:02 PM   CARE MANAGEMENT NOTE 09/01/2012  Patient:  Laura Chambers, Laura Chambers   Account Number:  [id number]  Date Initiated:  09/01/2012  Documentation initiated by:  Colleen Can  Subjective/Objective Assessment:   DX RT HIP OSTEOARTHRTIS; RT HIP REPLACEMNT-ANTERIOR APPROACH     Action/Plan:   CM spoke with patient. Plans are for patient to return to her home in [facility] where her sister, girlfriend, and daughter-in-law will be caregivers. She already has RW and cane   Anticipated DC Date:  09/01/2012   Anticipated DC Plan:  HOME W HOME HEALTH SERVICES  In-house referral  NA      DC Planning Services  CM consult      [facility] Choice  HOME HEALTH   Choice offered to / List presented to:  C-1 Patient   DME arranged  NA      DME agency  NA     HH arranged  HH-2 PT      HH agency  [facility]   Status of service:  Completed, signed off Medicare Important Message given?  NA - LOS <3 / Initial given by admissions (If response is "NO", the following Medicare IM given date fields will be blank) Date Medicare IM given:   Date Additional Medicare IM given:    Discharge Disposition:    Per UR Regulation:    If discussed at Long Length of Stay Meetings, dates discussed:    Comments:  09/01/2012 Colleen Can BSN RN CCM [phone] TCT [facility] rep who advises that they can provide [facility] services for pastient with start date of day after discharge.

## 2012-09-01 NOTE — Evaluation (Addendum)
Physical Therapy Evaluation Patient Details Name: Laura Chambers MRN: [id number] DOB: 1940-11-11 Today's Date: 09/01/2012 Time: 5621-3086 PT Time Calculation (min): 36 min  PT Assessment / Plan / Recommendation Clinical Impression  Pt s/p R THR presents with decreased R LE strength/ROM and post op pain limiting functional mobility    PT Assessment  Patient needs continued PT services    Follow Up Recommendations  Home health PT    Does the patient have the potential to tolerate intense rehabilitation      Barriers to Discharge None      Equipment Recommendations  None recommended by PT    Recommendations for Other Services OT consult   Frequency 7X/week    Precautions / Restrictions Precautions Precautions: Fall Precaution Comments: Restrictions Weight Bearing Restrictions: No Other Position/Activity Restrictions: WBAT   Pertinent Vitals/Pain 3-4/10; premed; ice packs provided      Mobility  Bed Mobility Bed Mobility: Supine to Sit;Sit to Supine Supine to Sit: 4: Min assist Sit to Supine: 4: Min assist Details for Bed Mobility Assistance: cues for sequence; pt rolled out of bed 2nd prior back surg Transfers Transfers: Sit to Stand;Stand to Sit Sit to Stand: 4: Min assist Stand to Sit: 4: Min assist Details for Transfer Assistance: cues for LE management and use of UEs to self assist Ambulation/Gait Ambulation/Gait Assistance: 4: Min guard Ambulation Distance (Feet): 100 Feet (twice) Assistive device: Rolling walker Ambulation/Gait Assistance Details: cues for posture and position from RW Gait Pattern: Step-to pattern    Exercises Total Joint Exercises Ankle Circles/Pumps: AROM;15 reps;Supine;Both Quad Sets: AROM;Both;10 reps;Supine Gluteal Sets: AROM;10 reps;Both;Supine Heel Slides: AAROM;15 reps;Supine;Right Hip ABduction/ADduction: AAROM;15 reps;Supine;Right   PT Diagnosis: Difficulty walking  PT Problem List: Decreased strength;Decreased range  of motion;Decreased knowledge of use of DME;Pain;Decreased activity tolerance PT Treatment Interventions: DME instruction;Gait training;Stair training;Functional mobility training;Therapeutic activities;Therapeutic exercise;Patient/family education   PT Goals Acute Rehab PT Goals PT Goal Formulation: With patient Time For Goal Achievement: 09/07/12 Potential to Achieve Goals: Good Pt will go Supine/Side to Sit: with supervision PT Goal: Supine/Side to Sit - Progress: Goal set today Pt will go Sit to Supine/Side: with supervision PT Goal: Sit to Supine/Side - Progress: Goal set today Pt will go Sit to Stand: with supervision PT Goal: Sit to Stand - Progress: Goal set today Pt will go Stand to Sit: with supervision PT Goal: Stand to Sit - Progress: Goal set today Pt will Ambulate: >150 feet;with supervision;with rolling walker PT Goal: Ambulate - Progress: Goal set today Pt will Go Up / Down Stairs: 1-2 stairs;with min assist;with least restrictive assistive device PT Goal: Up/Down Stairs - Progress: Goal set today  Visit Information  Last PT Received On: 09/01/12 Assistance Needed: +1    Subjective Data  Subjective: I had trouble getting up so I'm planning to stay another day Patient Stated Goal: Rehab and then home alone   Prior Functioning  Home Living Lives With: Alone Available Help at Discharge: Friend(s);Family Type of Home: House Home Access: Stairs to enter [employer] of Steps: 2 Entrance Stairs-Rails: None Home Layout: One level Home Adaptive Equipment: Walker - rolling;Bedside commode/3-in-1 Prior Function Level of Independence: Independent Able to Take Stairs?: Yes Driving: Yes Communication Communication: No difficulties    Cognition  Cognition Overall Cognitive Status: Appears within functional limits for tasks assessed/performed Arousal/Alertness: Awake/alert Orientation Level: Appears intact for tasks assessed Behavior During Session: [facility]  for tasks performed    Extremity/Trunk Assessment Right Upper Extremity Assessment RUE ROM/Strength/Tone: [facility] for tasks  assessed Left Upper Extremity Assessment LUE ROM/Strength/Tone: WFL for tasks assessed Right Lower Extremity Assessment RLE ROM/Strength/Tone: Deficits RLE ROM/Strength/Tone Deficits: Hip strength 2+/5 with AAROM to 90 flex and 25 abd Left Lower Extremity Assessment LLE ROM/Strength/Tone: WFL for tasks assessed   Balance    End of Session PT - End of Session Equipment Utilized During Treatment: Gait belt Activity Tolerance: Patient tolerated treatment well Patient left: in chair;with call bell/phone within reach;with family/visitor present Nurse Communication: Mobility status  GP     Laura Chambers 09/01/2012, 12:34 PM

## 2012-09-01 NOTE — Progress Notes (Signed)
Patient ID: Laura Chambers, female   DOB: 10/15/1940, 72 y.o.   MRN: [id number] Subjective: 1 Day Post-Op Procedure(s) (LRB): TOTAL HIP ARTHROPLASTY ANTERIOR APPROACH (Right)    Patient reports pain as moderate.  Relatively comfortable, able to already tell a difference in pre-op pain  Objective:   VITALS:   Filed Vitals:   09/01/12 1600  BP:   Pulse:   Temp:   Resp: 15    Neurovascular intact Incision: dressing C/D/I HV d/c'd  LABS  Recent Labs  09/01/12 0415  HGB 11.5*  HCT 35.1*  WBC 11.5*  PLT 180     Recent Labs  09/01/12 0415  NA 133*  K 4.5  BUN 18  CREATININE 0.71  GLUCOSE 164*    No results found for this basename: LABPT, INR,  in the last 72 hours   Assessment/Plan: 1 Day Post-Op Procedure(s) (LRB): TOTAL HIP ARTHROPLASTY ANTERIOR APPROACH (Right)   Advance diet Up with therapy Discharge home with home health either today after therapy or tomorrow, but home nonetheless

## 2012-09-01 NOTE — Progress Notes (Signed)
Physical Therapy Treatment Patient Details Name: STUTI SANDIN MRN: [id number] DOB: [birth year] Today's Date: 09/01/2012 Time: 3664-4034 PT Time Calculation (min): 13 min  PT Assessment / Plan / Recommendation Comments on Treatment Session       Follow Up Recommendations  Home health PT     Does the patient have the potential to tolerate intense rehabilitation     Barriers to Discharge        Equipment Recommendations  None recommended by PT    Recommendations for Other Services OT consult  Frequency 7X/week   Plan Discharge plan remains appropriate    Precautions / Restrictions Precautions Precautions: Fall Restrictions Weight Bearing Restrictions: No Other Position/Activity Restrictions: WBAT   Pertinent Vitals/Pain 5-6/10; MEDS requested, ice pack provided    Mobility  Bed Mobility Bed Mobility: Sit to Supine Sit to Supine: 4: Min assist Details for Bed Mobility Assistance: cues for sequence;  Transfers Transfers: Sit to Stand;Stand to Sit Sit to Stand: 4: Min assist Stand to Sit: 4: Min assist Details for Transfer Assistance: cues for LE management and use of UEs to self assist Ambulation/Gait Ambulation/Gait Assistance: 4: Min guard Ambulation Distance (Feet): 200 Feet Assistive device: Rolling walker Ambulation/Gait Assistance Details: cues for posture and position from RW Gait Pattern: Step-to pattern;Step-through pattern    Exercises     PT Diagnosis:    PT Problem List:   PT Treatment Interventions:     PT Goals Acute Rehab PT Goals PT Goal Formulation: With patient Time For Goal Achievement: 09/07/12 Potential to Achieve Goals: Good Pt will go Supine/Side to Sit: with supervision PT Goal: Supine/Side to Sit - Progress: Progressing toward goal Pt will go Sit to Supine/Side: with supervision PT Goal: Sit to Supine/Side - Progress: Progressing toward goal Pt will go Sit to Stand: with supervision PT Goal: Sit to Stand - Progress:  Progressing toward goal Pt will go Stand to Sit: with supervision PT Goal: Stand to Sit - Progress: Progressing toward goal Pt will Ambulate: >150 feet;with supervision;with rolling walker PT Goal: Ambulate - Progress: Progressing toward goal Pt will Go Up / Down Stairs: 1-2 stairs;with min assist;with least restrictive assistive device PT Goal: Up/Down Stairs - Progress: Goal set today  Visit Information  Last PT Received On: 09/01/12 Assistance Needed: +1    Subjective Data  Subjective: I'm ready to get back to bed Patient Stated Goal: Rehab and then home alone   Cognition  Cognition Overall Cognitive Status: Appears within functional limits for tasks assessed/performed Arousal/Alertness: Awake/alert Orientation Level: Appears intact for tasks assessed Behavior During Session: [facility] for tasks performed    Balance     End of Session PT - End of Session Equipment Utilized During Treatment: Gait belt Activity Tolerance: Patient tolerated treatment well Patient left: in bed;with call bell/phone within reach Nurse Communication: Mobility status   GP     Money Mckeithan 09/01/2012, 4:59 PM

## 2012-09-01 NOTE — Progress Notes (Signed)
Utilization review completed.  

## 2012-09-01 NOTE — Progress Notes (Signed)
OT Cancellation Note/Screen  Patient Details Name: Laura Chambers MRN: [id number] DOB: [birth year]   Cancelled Treatment:    Reason Eval/Treat Not Completed: Other (comment)  Pt screened for OT.  She has had other orthopedic sxs, has DME and assistance.    Ashir Kunz 09/01/2012, 2:27 PM Marica Otter, OTR/L [phone] 09/01/2012

## 2012-09-02 ENCOUNTER — Encounter (HOSPITAL_COMMUNITY): Payer: Self-pay | Admitting: Orthopedic Surgery

## 2012-09-02 MED ORDER — HYDROCODONE-ACETAMINOPHEN 5-325 MG PO TABS: 1.0000 | ORAL_TABLET | Freq: Four times a day (QID) | ORAL | Status: DC | PRN

## 2012-09-02 NOTE — Progress Notes (Signed)
Physical Therapy Treatment Patient Details Name: Laura Chambers MRN: [id number] DOB: 23-Jul-1940 Today's Date: 09/02/2012 Time: 5621-3086 PT Time Calculation (min): 17 min  PT Assessment / Plan / Recommendation Comments on Treatment Session  POD # 2 R Direct Anterior THR planning to D/C to home today so performed steps up backward due to no rail.  Given handout on HEP and steps.     Follow Up Recommendations  Home health PT     Does the patient have the potential to tolerate intense rehabilitation     Barriers to Discharge        Equipment Recommendations  None recommended by PT    Recommendations for Other Services    Frequency 7X/week   Plan Discharge plan remains appropriate    Precautions / Restrictions Precautions Precautions: Fall Restrictions Weight Bearing Restrictions: No Other Position/Activity Restrictions: WBAT   Pertinent Vitals/Pain C/o "soreness"    Mobility  Bed Mobility Bed Mobility: Supine to Sit Supine to Sit: 5: Supervision Details for Bed Mobility Assistance: increased time Transfers Transfers: Sit to Stand;Stand to Sit Sit to Stand: 5: Supervision;From bed Stand to Sit: 5: Supervision;To bed Details for Transfer Assistance: increased time, good safety  Ambulation/Gait Ambulation/Gait Assistance: 5: Supervision Ambulation Distance (Feet): 48 Feet Assistive device: Rolling walker Ambulation/Gait Assistance Details: <25% VC's on safety with turns and backward gait Gait Pattern: Step-to pattern;Step-through pattern Stairs: Yes Stairs Assistance: 4: Min assist Stair Management Technique: No rails;Backwards;With walker (with family) Number of Stairs: 2     PT Goals                                                        progressing    Visit Information  Last PT Received On: 09/02/12 Assistance Needed: +1    Subjective Data  Subjective: I'm ready to go home   Cognition       Balance   good  End of Session PT - End of  Session Equipment Utilized During Treatment: Gait belt Activity Tolerance: Patient tolerated treatment well Patient left: in bed;with call bell/phone within reach   Felecia Shelling  PTA [facility]  Acute  Rehab Pager      [phone]

## 2012-09-07 NOTE — Discharge Summary (Signed)
Physician Discharge Summary  Patient ID: Laura Chambers MRN: [id number] DOB/AGE: [birth year] 72 y.o.  Admit date: 08/31/2012 Discharge date: 09/02/2012   Procedures:  Procedure(s) (LRB): TOTAL HIP ARTHROPLASTY ANTERIOR APPROACH (Right)  Attending Physician:  Dr. Durene Romans   Admission Diagnoses:   Right hip OA / pain  Discharge Diagnoses:  S/P right total hip arthroplasty, anterior approach Diagnosis  . Colon cancer  . HTN (hypertension)  . Hypercholesterolemia  . Obesity  . Hx of colonic polyps  . Renal angiomyolipoma  . Anxiety  . Hydronephrosis of right kidney  . Esophageal hernia  . 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 Arthritis  . Fibromyalgia  . Complication of anesthesia - "irritated trachea" with cough x 1 year from intubation   HPI:   Laura Chambers, 72 y.o. female, has a history of pain and functional disability in the right hip(s) due to arthritis and patient has failed non-surgical conservative treatments for greater than 12 weeks to include NSAID's and/or analgesics, corticosteriod injections and activity modification. Onset of symptoms was gradual starting 5 years ago with gradually worsening course since that time.The patient noted no past surgery on the right hip(s). Patient currently rates pain in the right hip at 9 out of 10 with activity. Patient has night pain, worsening of pain with activity and weight bearing, trendelenberg gait, pain that interfers with activities of daily living and pain with passive range of motion. Patient has evidence of periarticular osteophytes and joint space narrowing by imaging studies. This condition presents safety issues increasing the risk of falls. There is no current active infection. Risks, benefits and expectations were discussed with the patient. Patient understand the risks, benefits and expectations and wishes to proceed with surgery.  PCP: Cassell Clement, MD   Discharged Condition:  good  Hospital Course:  Patient underwent the above stated procedure on 08/31/2012. Patient tolerated the procedure well and brought to the recovery room in good condition and subsequently to the floor.  POD #1 BP: 110/70 ; Pulse: 87 ; Temp: 98.1 F (36.7 C) ; Resp: 16  Pt's foley was removed, as well as the hemovac drain removed. IV was changed to a saline lock. Patient reports pain as moderate. Relatively comfortable, able to already tell a difference in pre-op pain Neurovascular intact, dorsiflexion/plantar flexion intact, incision: dressing C/D/I, no cellulitis present and compartment soft.   LABS  Basename  09/01/12    0415  HGB  11.5  HCT  35.1   POD #2  BP: 109/67 ; Pulse: 88 ; Temp: 98.9 F (37.2 C) ; Resp: 16  Patient reports pain as mild, pain well controlled. Relatively comfortable, able to already tell a difference in pre-op pain. Ready to be discharged home. Neurovascular intact, dorsiflexion/plantar flexion intact, incision: dressing C/D/I, no cellulitis present and compartment soft.    LABS   No new labs  Discharge Exam: General appearance: alert, cooperative and no distress Extremities: Homans sign is negative, no sign of DVT, no edema, redness or tenderness in the calves or thighs and no ulcers, gangrene or trophic changes  Disposition:   Home-Health Care Svc with follow up in 2 weeks  Follow up in 2 weeks at [facility]. Follow up with OLIN,MATTHEW D in 2 weeks.  Contact information:  [facility] [address], [address] [place] [address] [phone]         Medication List    STOP taking these medications  ALEVE 220 MG Caps  Generic drug:  Naproxen Sodium     aspirin EC 81 MG tablet      TAKE these medications       atenolol 25 MG tablet  Commonly known as:  TENORMIN  Take 25 mg by mouth every evening.     Calcium-Magnesium-Vitamin D 500-250-125 MG-MG-UNIT Tabs  Take 1 tablet by mouth  daily.     folic acid 800 MCG tablet  Commonly known as:  FOLVITE  Take 400 mcg by mouth daily.     HYDROcodone-acetaminophen 5-325 MG per tablet  Commonly known as:  NORCO  Take 1-2 tablets by mouth every 6 (six) hours as needed for pain.     losartan-hydrochlorothiazide 50-12.5 MG per tablet  Commonly known as:  HYZAAR  Take 1 tablet by mouth every evening.     multivitamin tablet  Take 1 tablet by mouth daily.     pregabalin 50 MG capsule  Commonly known as:  LYRICA  Take 50 mg by mouth at bedtime.     rosuvastatin 10 MG tablet  Commonly known as:  CRESTOR  Take 10 mg by mouth every evening.         Signed: Anastasio Auerbach. Babish   PAC  09/07/2012, 5:03 PM

## 2012-11-05 ENCOUNTER — Other Ambulatory Visit: Payer: Self-pay | Admitting: *Deleted

## 2012-11-05 DIAGNOSIS — G4700 Insomnia, unspecified: Secondary | ICD-10-CM

## 2012-11-05 NOTE — Telephone Encounter (Signed)
Pharmacy called requesting refill of Ambien, verbal ok given

## 2012-11-05 NOTE — Telephone Encounter (Signed)
Refill request from [facility], ambien dose decreased to 5 mg hs prn

## 2012-11-06 MED ORDER — ZOLPIDEM TARTRATE 5 MG PO TABS: 5.0000 mg | ORAL_TABLET | Freq: Every evening | ORAL | Status: AC | PRN

## 2013-03-10 ENCOUNTER — Other Ambulatory Visit (INDEPENDENT_AMBULATORY_CARE_PROVIDER_SITE_OTHER): Payer: Medicare Other

## 2013-03-10 DIAGNOSIS — I119 Hypertensive heart disease without heart failure: Secondary | ICD-10-CM

## 2013-03-10 DIAGNOSIS — E7800 Pure hypercholesterolemia, unspecified: Secondary | ICD-10-CM

## 2013-03-10 LAB — BASIC METABOLIC PANEL
CO2: 28 mEq/L (ref 19–32)
Calcium: 9.4 mg/dL (ref 8.4–10.5)
Chloride: 102 mEq/L (ref 96–112)
Glucose, Bld: 97 mg/dL (ref 70–99)
Sodium: 137 mEq/L (ref 135–145)

## 2013-03-10 LAB — HEPATIC FUNCTION PANEL
AST: 22 U/L (ref 0–37)
Alkaline Phosphatase: 70 U/L (ref 39–117)
Total Bilirubin: 2 mg/dL — ABNORMAL HIGH (ref 0.3–1.2)

## 2013-03-10 LAB — LIPID PANEL
HDL: 49.7 mg/dL (ref >39.00)
Total CHOL/HDL Ratio: 3

## 2013-03-10 NOTE — Progress Notes (Signed)
Quick Note:  Please make copy of labs for patient visit. ______ 

## 2013-03-11 ENCOUNTER — Ambulatory Visit (INDEPENDENT_AMBULATORY_CARE_PROVIDER_SITE_OTHER): Payer: Medicare Other | Admitting: Cardiology

## 2013-03-11 ENCOUNTER — Encounter: Payer: Self-pay | Admitting: Cardiology

## 2013-03-11 VITALS — BP 126/62 | HR 100 | Ht 67.0 in | Wt 206.0 lb

## 2013-03-11 DIAGNOSIS — E7800 Pure hypercholesterolemia, unspecified: Secondary | ICD-10-CM

## 2013-03-11 DIAGNOSIS — R7309 Other abnormal glucose: Secondary | ICD-10-CM

## 2013-03-11 DIAGNOSIS — R739 Hyperglycemia, unspecified: Secondary | ICD-10-CM

## 2013-03-11 DIAGNOSIS — E785 Hyperlipidemia, unspecified: Secondary | ICD-10-CM

## 2013-03-11 DIAGNOSIS — I119 Hypertensive heart disease without heart failure: Secondary | ICD-10-CM

## 2013-03-11 MED ORDER — ATORVASTATIN CALCIUM 10 MG PO TABS: 10.0000 mg | ORAL_TABLET | Freq: Every day | ORAL | Status: DC

## 2013-03-11 NOTE — Assessment & Plan Note (Signed)
The patient has been avoiding white foods and simple carbs and her blood sugars have improved.  Previously she was drinking a lot of orange juice each day which was contributing to her hyperglycemia

## 2013-03-11 NOTE — Progress Notes (Signed)
Laura Chambers Date of Birth:  11/06/1940 [facility] [address] [place], [place]  [address] [phone]  Fax   [phone]  HPI: This pleasant 72 year old woman is seen for a scheduled followup office visit. She has a past history of essential hypertension and hypercholesterolemia. He does not have any history of ischemic heart disease. . She underwent successful back surgery in August 2012 by Dr. Jordan Likes.  In addition she has had both knees replaced and has had a right hip replacement.  Dr. Charlann Boxer is her orthopedist. 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  She has not been having any chest pain or shortness of breath. She has been having occasional leg cramps at night and occasionally feels lightheaded. She notes that her blood pressure has been running a little more than usual.  Patient has a history of a hiatal hernia and is on Prilosec OTC. 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 atenolol (TENORMIN) 25 MG tablet Take 25 mg by mouth every evening.      . Calcium-Magnesium-Vitamin D [phone] MG-MG-UNIT TABS Take 1 tablet by mouth daily.      . folic acid (FOLVITE) 800 MCG tablet Take 400 mcg by mouth daily.      Marland Kitchen losartan-hydrochlorothiazide (HYZAAR) 50-12.5 MG per tablet Take 1 tablet by mouth every evening.      . Multiple Vitamin (MULTIVITAMIN) tablet Take 1 tablet by mouth daily.      . pregabalin (LYRICA) 50 MG capsule Take 50 mg by mouth at bedtime.       Marland Kitchen atorvastatin (LIPITOR) 10 MG tablet Take 1 tablet (10 mg total) by mouth daily.  90 tablet  3   No current facility-administered medications for this visit.    Allergies  Allergen Reactions  . Epinephrine Other (See Comments)    In surgery, and shoulder started jerking and bp got high  . Levaquin [Levofloxacin In D5w] Nausea And Vomiting    . Trovan [Alatrofloxacin] Nausea And Vomiting    pancreatitis    Patient Active Problem List   Diagnosis Date Noted  . Hyperglycemia 06/16/2012  . 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 Dyslipidemia 06/16/2012  . Globus sensation 02/04/2012  . Nausea 02/04/2012  . Bowel habit changes 02/04/2012  . Pyrosis 02/04/2012    History  Smoking status  . Never Smoker   Smokeless tobacco  . Never Used    History  Alcohol Use No    Family History  Problem Relation Age of Onset  . Diabetes Father   . Osteoarthritis Mother   . Colon cancer Sister   . Colon polyps      nephew  . Colon polyps      neice  . Osteoarthritis Father   . Diabetes Sister   . Diabetes Brother   . Heart disease Father   . Stroke Mother   . Stroke Father     Review of Systems: The patient denies any heat or cold intolerance.  No weight gain or weight loss.  The patient denies headaches or blurry vision.  There is no cough or sputum production.  The patient denies dizziness.  There is no hematuria or hematochezia.  The patient denies any muscle aches or arthritis.  The patient denies any rash.  The patient denies frequent falling or instability.  There is no  history of depression or anxiety.  All other systems were reviewed and are negative.   Physical Exam: Filed Vitals:   03/11/13 1520  BP: 126/62  Pulse: 100   the general appearance reveals a well-developed well-nourished woman in no distress.The head and neck exam reveals pupils equal and reactive.  Extraocular movements are full.  There is no scleral icterus.  The mouth and pharynx are normal.  The neck is supple.  The carotids reveal no bruits.  The jugular venous pressure is normal.  The  thyroid is not enlarged.  There is no lymphadenopathy.  The chest is clear to percussion and auscultation.  There are no rales or rhonchi.  Expansion of the chest is symmetrical.  The precordium is quiet.  The first heart sound is  normal.  The second heart sound is physiologically split.  There is no murmur gallop rub or click.  There is no abnormal lift or heave.  The abdomen is soft and nontender.  The bowel sounds are normal.  The liver and spleen are not enlarged.  There are no abdominal masses.  There are no abdominal bruits.  Extremities reveal good pedal pulses.  There is no phlebitis or edema.  There is no cyanosis or clubbing.  Strength is normal and symmetrical in all extremities.  There is no lateralizing weakness.  There are no sensory deficits.  The skin is warm and dry.  There is no rash.      Assessment / Plan: Continue same medication.  Switch from Crestor to generic Lipitor.  Recheck in 6 months for office visit and fasting lab work.

## 2013-03-11 NOTE — Assessment & Plan Note (Signed)
We reviewed her recent labs which are very good.  She is on Crestor which is expensive for her and she would like to try something less expensive.  We will switch her off Crestor and start generic Lipitor 10 mg one daily #90.  We will plan to recheck fasting labs in 6 months

## 2013-03-11 NOTE — Patient Instructions (Signed)
STOP CRESTOR AND START LIPITOR 10 MG DAILY  Your physician wants you to follow-up in: 6 months with fasting labs (lp/bmet/hfp)  You will receive a reminder letter in the mail two months in advance. If you don't receive a letter, please call our office to schedule the follow-up appointment.

## 2013-03-11 NOTE — Assessment & Plan Note (Signed)
The patient has not been experiencing any chest pain or shortness of breath.  No dizziness or syncope.  No palpitations.

## 2013-03-17 ENCOUNTER — Other Ambulatory Visit: Payer: Self-pay | Admitting: Cardiology

## 2013-03-28 ENCOUNTER — Telehealth: Payer: Self-pay | Admitting: Cardiology

## 2013-03-28 NOTE — Telephone Encounter (Signed)
New problem   Pt is having a reaction from Lipitor w/ nausea, joint pain, dizziness and cramps. Please call pt. She thinks it is the medication she is taking.

## 2013-03-28 NOTE — Telephone Encounter (Signed)
Discussed with  Dr. Patty Sermons and will have him hold Lipitor for 2 weeks and call back with update. Did tolerate Crestor

## 2013-03-28 NOTE — Telephone Encounter (Signed)
Ok per  Dr. Brackbill  

## 2013-04-08 ENCOUNTER — Telehealth: Payer: Self-pay | Admitting: Cardiology

## 2013-04-08 NOTE — Telephone Encounter (Signed)
Patient has been off Lipitor for a couple of weeks and side effects better. Patient does want to go back on the Crestor 10 mg daily, samples put at front dest for pick up

## 2013-04-08 NOTE — Telephone Encounter (Signed)
Left message to call back  

## 2013-04-08 NOTE — Telephone Encounter (Signed)
Follow Up ° °Pt returning call.  °

## 2013-04-08 NOTE — Telephone Encounter (Signed)
Follow up:  Pt states she is returning Melinda's call from last week

## 2013-04-10 NOTE — Telephone Encounter (Signed)
Okay to go back on crestor.

## 2013-05-12 ENCOUNTER — Telehealth: Payer: Self-pay | Admitting: *Deleted

## 2013-05-12 NOTE — Telephone Encounter (Signed)
Placed samples crestor 10 mg up front to cover 6 weeks.

## 2013-05-24 ENCOUNTER — Other Ambulatory Visit: Payer: Self-pay | Admitting: Cardiology

## 2013-06-10 ENCOUNTER — Encounter: Payer: Self-pay | Admitting: *Deleted

## 2013-06-15 ENCOUNTER — Encounter: Payer: Self-pay | Admitting: Internal Medicine

## 2013-06-24 ENCOUNTER — Other Ambulatory Visit: Payer: Self-pay | Admitting: Cardiology

## 2013-06-28 ENCOUNTER — Telehealth: Payer: Self-pay

## 2013-06-28 NOTE — Telephone Encounter (Signed)
Patient called to get samples of crestor, place samples up front

## 2013-07-28 ENCOUNTER — Other Ambulatory Visit: Payer: Self-pay | Admitting: Cardiology

## 2013-09-12 ENCOUNTER — Other Ambulatory Visit: Payer: Self-pay | Admitting: Cardiology

## 2013-09-12 ENCOUNTER — Other Ambulatory Visit: Payer: Self-pay | Admitting: *Deleted

## 2013-09-12 MED ORDER — ZOLPIDEM TARTRATE 5 MG PO TABS: 5.0000 mg | ORAL_TABLET | Freq: Every evening | ORAL | Status: DC | PRN

## 2013-10-04 ENCOUNTER — Telehealth: Payer: Self-pay | Admitting: *Deleted

## 2013-10-04 NOTE — Telephone Encounter (Signed)
PA to [facility] for patients zolpidem

## 2013-10-17 NOTE — Telephone Encounter (Signed)
[employer] approved patient ZOLPIDEM THROUGH 07/06/2014

## 2013-10-25 ENCOUNTER — Other Ambulatory Visit: Payer: Self-pay | Admitting: Cardiology

## 2013-11-23 ENCOUNTER — Other Ambulatory Visit: Payer: Self-pay | Admitting: Cardiology

## 2013-11-24 ENCOUNTER — Other Ambulatory Visit: Payer: Self-pay

## 2013-11-24 ENCOUNTER — Encounter: Payer: Self-pay | Admitting: Internal Medicine

## 2013-11-24 MED ORDER — LOSARTAN POTASSIUM-HCTZ 50-12.5 MG PO TABS: ORAL_TABLET | ORAL | Status: DC

## 2013-12-21 ENCOUNTER — Other Ambulatory Visit: Payer: Self-pay | Admitting: Cardiology

## 2013-12-21 ENCOUNTER — Other Ambulatory Visit: Payer: Self-pay | Admitting: *Deleted

## 2013-12-21 MED ORDER — ROSUVASTATIN CALCIUM 10 MG PO TABS: 10.0000 mg | ORAL_TABLET | Freq: Every day | ORAL | Status: DC

## 2013-12-22 ENCOUNTER — Other Ambulatory Visit: Payer: Self-pay | Admitting: *Deleted

## 2013-12-22 MED ORDER — LOSARTAN POTASSIUM-HCTZ 50-12.5 MG PO TABS: ORAL_TABLET | ORAL | Status: DC

## 2013-12-23 ENCOUNTER — Encounter: Payer: Self-pay | Admitting: Cardiology

## 2014-01-11 ENCOUNTER — Ambulatory Visit (AMBULATORY_SURGERY_CENTER): Payer: Self-pay | Admitting: *Deleted

## 2014-01-11 ENCOUNTER — Other Ambulatory Visit (INDEPENDENT_AMBULATORY_CARE_PROVIDER_SITE_OTHER): Payer: Private Health Insurance - Indemnity

## 2014-01-11 VITALS — Ht 67.0 in | Wt 212.4 lb

## 2014-01-11 DIAGNOSIS — E7800 Pure hypercholesterolemia, unspecified: Secondary | ICD-10-CM

## 2014-01-11 DIAGNOSIS — I119 Hypertensive heart disease without heart failure: Secondary | ICD-10-CM

## 2014-01-11 DIAGNOSIS — Z86010 Personal history of colonic polyps: Secondary | ICD-10-CM

## 2014-01-11 LAB — HEPATIC FUNCTION PANEL
ALT: 18 U/L (ref 0–35)
AST: 22 U/L (ref 0–37)
Albumin: 3.9 g/dL (ref 3.5–5.2)
Alkaline Phosphatase: 83 U/L (ref 39–117)
BILIRUBIN TOTAL: 1.2 mg/dL (ref 0.2–1.2)
Bilirubin, Direct: 0.1 mg/dL (ref 0.0–0.3)
Total Protein: 7.3 g/dL (ref 6.0–8.3)

## 2014-01-11 LAB — BASIC METABOLIC PANEL
BUN: 13 mg/dL (ref 6–23)
CO2: 30 mEq/L (ref 19–32)
Calcium: 9.5 mg/dL (ref 8.4–10.5)
Chloride: 102 mEq/L (ref 96–112)
Creatinine, Ser: 0.8 mg/dL (ref 0.4–1.2)
GFR: 76.81 mL/min (ref >60.00)
GLUCOSE: 97 mg/dL (ref 70–99)
POTASSIUM: 4.3 meq/L (ref 3.5–5.1)
Sodium: 140 mEq/L (ref 135–145)

## 2014-01-11 LAB — LIPID PANEL
Cholesterol: 158 mg/dL (ref 0–200)
HDL: 49.2 mg/dL (ref >39.00)
LDL Cholesterol: 71 mg/dL (ref 0–99)
NONHDL: 108.8
Total CHOL/HDL Ratio: 3
Triglycerides: 188 mg/dL — ABNORMAL HIGH (ref 0.0–149.0)
VLDL: 37.6 mg/dL (ref 0.0–40.0)

## 2014-01-11 MED ORDER — MOVIPREP 100 G PO SOLR
ORAL | Status: DC
Start: 2014-01-11 — End: 2014-01-25

## 2014-01-11 NOTE — Progress Notes (Signed)
No allergies to eggs or soy. No problems with anesthesia.  Pt given Emmi instructions for colonoscopy  No oxygen use  No diet drug use  

## 2014-01-12 ENCOUNTER — Ambulatory Visit (INDEPENDENT_AMBULATORY_CARE_PROVIDER_SITE_OTHER): Payer: Private Health Insurance - Indemnity | Admitting: Cardiology

## 2014-01-12 ENCOUNTER — Encounter: Payer: Self-pay | Admitting: Cardiology

## 2014-01-12 VITALS — BP 132/80 | HR 84 | Ht 67.0 in | Wt 209.0 lb

## 2014-01-12 DIAGNOSIS — G609 Hereditary and idiopathic neuropathy, unspecified: Secondary | ICD-10-CM

## 2014-01-12 DIAGNOSIS — E785 Hyperlipidemia, unspecified: Secondary | ICD-10-CM

## 2014-01-12 DIAGNOSIS — I119 Hypertensive heart disease without heart failure: Secondary | ICD-10-CM

## 2014-01-12 DIAGNOSIS — E7800 Pure hypercholesterolemia, unspecified: Secondary | ICD-10-CM

## 2014-01-12 DIAGNOSIS — G629 Polyneuropathy, unspecified: Secondary | ICD-10-CM | POA: Insufficient documentation

## 2014-01-12 MED ORDER — ROSUVASTATIN CALCIUM 10 MG PO TABS: 10.0000 mg | ORAL_TABLET | Freq: Every day | ORAL | Status: DC

## 2014-01-12 MED ORDER — ATENOLOL 25 MG PO TABS: ORAL_TABLET | ORAL | Status: DC

## 2014-01-12 MED ORDER — LOSARTAN POTASSIUM-HCTZ 50-12.5 MG PO TABS: ORAL_TABLET | ORAL | Status: DC

## 2014-01-12 NOTE — Assessment & Plan Note (Signed)
No chest pain or shortness of breath.  No palpitations.  No dizziness or syncope.  No claudication of her legs.

## 2014-01-12 NOTE — Progress Notes (Signed)
Quick Note:  Please make copy of labs for patient visit. ______ 

## 2014-01-12 NOTE — Assessment & Plan Note (Signed)
Patient has a history of dyslipidemia.  She is intolerant of Lipitor but is tolerating low dose Crestor.  She is not having any myalgias from the Crestor.  Her blood results are satisfactory today

## 2014-01-12 NOTE — Assessment & Plan Note (Signed)
Her symptoms of leg and foot pain are worse at rest than with exertion.

## 2014-01-12 NOTE — Patient Instructions (Signed)
Your physician recommends that you continue on your current medications as directed. Please refer to the Current Medication list given to you today.  Your physician wants you to follow-up in: 6 months with fasting labs (lp/bmet/hfp) and EKG  You will receive a reminder letter in the mail two months in advance. If you don't receive a letter, please call our office to schedule the follow-up appointment.   

## 2014-01-12 NOTE — Progress Notes (Signed)
Laura Chambers Date of Birth:  04/26/1941 [facility] [address] [place], [place]  [address] [phone]  Fax   [phone]  HPI: This pleasant 73 year old woman is seen for a scheduled followup office visit. She has a past history of essential hypertension and hypercholesterolemia. He does not have any history of ischemic heart disease. . She underwent successful back surgery in August 2012 by Dr. Annette Stable. In addition she has had both knees replaced and has had a right hip replacement. Dr. [name] is her orthopedist.  The patient has a history of peripheral neuropathy which she relates to her knee surgery and her back surgery.  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  She is tired at the end of the day . She has not been having any chest pain or shortness of breath. She has been having occasional leg cramps at night and occasionally feels lightheaded. She notes that her blood pressure has been running a little more than usual.  Patient has a history of a hiatal hernia and is on Prilosec OTC. 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 aspirin 81 MG tablet Take 81 mg by mouth daily.      Marland Kitchen atenolol (TENORMIN) 25 MG tablet TAKE 1 TABLET (25 MG TOTAL) BY MOUTH DAILY.  90 tablet  3  . Calcium-Magnesium-Vitamin D [id number] MG-MG-UNIT TABS Take 1 tablet by mouth.       . folic acid (FOLVITE) 659 MCG tablet Take 400 mcg by mouth daily.      Marland Kitchen gabapentin (NEURONTIN) 300 MG capsule Take 300 mg by mouth daily.      Marland Kitchen losartan-hydrochlorothiazide (HYZAAR) 50-12.5 MG per tablet TAKE 1 TABLET BY MOUTH ONCE DAILY  90 tablet  3  . MOVIPREP 100 G SOLR moviprep as directed. No substitutions  1 kit  0  . Multiple Vitamin (MULTIVITAMIN) tablet Take 1 tablet by mouth daily.        . rosuvastatin (CRESTOR) 10 MG tablet Take 1 tablet (10 mg total) by mouth daily.  90 tablet  3   No current facility-administered medications for this visit.    Allergies  Allergen Reactions  . Epinephrine Other (See Comments)    In surgery, and shoulder started jerking and bp got high  . Levaquin [Levofloxacin In D5w] Nausea And Vomiting  . Lipitor [Atorvastatin]     Joint pain and dizziness   . Trovan [Alatrofloxacin] Nausea And Vomiting    pancreatitis    Patient Active Problem List   Diagnosis Date Noted  . Peripheral neuropathy 01/12/2014  . Hyperglycemia 06/16/2012  . 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 Dyslipidemia 06/16/2012  . Globus sensation 02/04/2012  . Nausea 02/04/2012  . Bowel habit changes 02/04/2012  . Pyrosis 02/04/2012    History  Smoking status  . Never Smoker   Smokeless tobacco  . Never Used    History  Alcohol Use No    Family History  Problem Relation Age of Onset  . Diabetes Father   . Osteoarthritis Father   . Heart disease Father   . Stroke Father   . Osteoarthritis Mother   . Stroke Mother   . Colon cancer Sister 11  . Colon polyps      neice/nephew  . Diabetes  Sister   . Diabetes Brother     Review of Systems: The patient denies any heat or cold intolerance.  No weight gain or weight loss.  The patient denies headaches or blurry vision.  There is no cough or sputum production.  The patient denies dizziness.  There is no hematuria or hematochezia.  The patient denies any muscle aches or arthritis.  The patient denies any rash.  The patient denies frequent falling or instability.  There is no history of depression or anxiety.  All other systems were reviewed and are negative.   Physical Exam: Filed Vitals:   01/12/14 1522  BP: 132/80  Pulse: 84   the general appearance reveals a well-developed well-nourished middle-aged woman in no distress.The head and neck exam reveals pupils equal and  reactive.  Extraocular movements are full.  There is no scleral icterus.  The mouth and pharynx are normal.  The neck is supple.  The carotids reveal no bruits.  The jugular venous pressure is normal.  The  thyroid is not enlarged.  There is no lymphadenopathy.  The chest is clear to percussion and auscultation.  There are no rales or rhonchi.  Expansion of the chest is symmetrical.  The precordium is quiet.  The first heart sound is normal.  The second heart sound is physiologically split.  There is no murmur gallop rub or click.  There is no abnormal lift or heave.  The abdomen is soft and nontender.  The bowel sounds are normal.  The liver and spleen are not enlarged.  There are no abdominal masses.  There are no abdominal bruits.  Extremities reveal good pedal pulses.  There is no phlebitis or edema.  There is no cyanosis or clubbing.  Strength is normal and symmetrical in all extremities.  There is no lateralizing weakness.  There are no sensory deficits.  The skin is warm and dry.  There is no rash.     Assessment / Plan: 1. hypertensive heart disease without heart failure 2. Dyslipidemia 3. peripheral neuropathy 4. 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 osteoarthritis with previous bilateral knee replacement and right hip replacement.  Plan: Continue same medication.  She will work harder on weight loss.  Her weight is up 3 pounds.  Recheck in 6 months for office visit EKG lipid panel hepatic function panel and basal metabolic panel.

## 2014-01-25 ENCOUNTER — Encounter: Payer: Self-pay | Admitting: Internal Medicine

## 2014-01-25 ENCOUNTER — Ambulatory Visit (AMBULATORY_SURGERY_CENTER): Payer: Private Health Insurance - Indemnity | Admitting: Internal Medicine

## 2014-01-25 VITALS — BP 137/100 | HR 69 | Temp 97.5°F | Resp 23 | Ht 67.0 in | Wt 216.0 lb

## 2014-01-25 DIAGNOSIS — Z86010 Personal history of colonic polyps: Secondary | ICD-10-CM

## 2014-01-25 DIAGNOSIS — Z800 Family history of malignant neoplasm of digestive organs: Secondary | ICD-10-CM

## 2014-01-25 MED ORDER — SODIUM CHLORIDE 0.9 % IV SOLN: 500.0000 mL | INTRAVENOUS | Status: DC

## 2014-01-25 NOTE — Op Note (Signed)
[place]  [address] [place], [address]   COLONOSCOPY PROCEDURE REPORT  PATIENT: Laura Chambers, Laura Chambers  MR#: [id number] BIRTHDATE: 10-13-1940 , 72  yrs. old GENDER: Female ENDOSCOPIST: Lafayette Dragon, MD REFERRED QG:BEEFEO Mare Ferrari, M.D. PROCEDURE DATE:  01/25/2014 PROCEDURE:   Colonoscopy, screening First Screening Colonoscopy - Avg.  risk and is 50 yrs.  old or older - No.  Prior Negative Screening - Now for repeat screening. N/A  History of Adenoma - Now for follow-up colonoscopy & has been > or = to 3 yrs.  Yes hx of adenoma.  Has been 3 or more years since last colonoscopy.  Polyps Removed Today? No.  Recommend repeat exam, <10 yrs? Yes.  High risk (family or personal hx). ASA CLASS:   Class II INDICATIONS:Patient's immediate family history of colon cancer and adenomatous polyp removed in 1988.  Last colonoscopy in January 2008 no polyps.  A positive family history of colon cancer in a sibling. MEDICATIONS: MAC sedation, administered by CRNA and propofol (Diprivan) 200mg  IV  DESCRIPTION OF PROCEDURE:   After the risks benefits and alternatives of the procedure were thoroughly explained, informed consent was obtained.  A digital rectal exam revealed no abnormalities of the rectum.   The LB [id number]  endoscope was introduced through the anus and advanced to the cecum, which was identified by both the appendix and ileocecal valve. No adverse events experienced.   The quality of the prep was good, using MoviPrep  The instrument was then slowly withdrawn as the colon was fully examined.      COLON FINDINGS: There was moderate diverticulosis noted throughout the entire examined colon with associated angulation and muscular hypertrophy.  Retroflexed views revealed no abnormalities. The time to cecum=3 minutes 12 seconds.  Withdrawal time=7 minutes 22 seconds.  The scope was withdrawn and the procedure completed. COMPLICATIONS: There were no  complications.  ENDOSCOPIC IMPRESSION: There was moderate diverticulosis noted throughout the entire examined colon  RECOMMENDATIONS: high fiber diet Recall colonoscopy in 5 years   eSigned:  Lafayette Dragon, MD 01/25/2014 9:55 AM   cc:   PATIENT NAME:  [name], [name] MR#: [id number]

## 2014-01-25 NOTE — Patient Instructions (Signed)
Discharge instructions given with verbal understanding. Handouts on diverticulosis and a high fiber diet. Resume previous medications. YOU HAD AN ENDOSCOPIC PROCEDURE TODAY AT THE [facility]: Refer to the procedure report that was given to you for any specific questions about what was found during the examination.  If the procedure report does not answer your questions, please call your gastroenterologist to clarify.  If you requested that your care partner not be given the details of your procedure findings, then the procedure report has been included in a sealed envelope for you to review at your convenience later.  YOU SHOULD EXPECT: Some feelings of bloating in the abdomen. Passage of more gas than usual.  Walking can help get rid of the air that was put into your GI tract during the procedure and reduce the bloating. If you had a lower endoscopy (such as a colonoscopy or flexible sigmoidoscopy) you may notice spotting of blood in your stool or on the toilet paper. If you underwent a bowel prep for your procedure, then you may not have a normal bowel movement for a few days.  DIET: Your first meal following the procedure should be a light meal and then it is ok to progress to your normal diet.  A half-sandwich or bowl of soup is an example of a good first meal.  Heavy or fried foods are harder to digest and may make you feel nauseous or bloated.  Likewise meals heavy in dairy and vegetables can cause extra gas to form and this can also increase the bloating.  Drink plenty of fluids but you should avoid alcoholic beverages for 24 hours.  ACTIVITY: Your care partner should take you home directly after the procedure.  You should plan to take it easy, moving slowly for the rest of the day.  You can resume normal activity the day after the procedure however you should NOT DRIVE or use heavy machinery for 24 hours (because of the sedation medicines used during the test).    SYMPTOMS TO REPORT  IMMEDIATELY: A gastroenterologist can be reached at any hour.  During normal business hours, 8:30 AM to 5:00 PM Monday through Friday, call [phone].  After hours and on weekends, please call the GI answering service at [phone] who will take a message and have the physician on call contact you.   Following lower endoscopy (colonoscopy or flexible sigmoidoscopy):  Excessive amounts of blood in the stool  Significant tenderness or worsening of abdominal pains  Swelling of the abdomen that is new, acute  Fever of 100F or higher FOLLOW UP: If any biopsies were taken you will be contacted by phone or by letter within the next 1-3 weeks.  Call your gastroenterologist if you have not heard about the biopsies in 3 weeks.  Our staff will call the home number listed on your records the next business day following your procedure to check on you and address any questions or concerns that you may have at that time regarding the information given to you following your procedure. This is a courtesy call and so if there is no answer at the home number and we have not heard from you through the emergency physician on call, we will assume that you have returned to your regular daily activities without incident.  SIGNATURES/CONFIDENTIALITY: You and/or your care partner have signed paperwork which will be entered into your electronic medical record.  These signatures attest to the fact that that the information above on your After   Visit Summary has been reviewed and is understood.  Full responsibility of the confidentiality of this discharge information lies with you and/or your care-partner. 

## 2014-01-25 NOTE — Progress Notes (Signed)
Procedure ends, to recovery, report given and VSS. 

## 2014-01-26 ENCOUNTER — Telehealth: Payer: Self-pay

## 2014-01-26 NOTE — Telephone Encounter (Signed)
  Follow up Call-  Call back number 01/25/2014  Post procedure Call Back phone  # [phone]  Permission to leave phone message Yes     Patient questions:  Do you have a fever, pain , or abdominal swelling? No. Pain Score  0 *  Have you tolerated food without any problems? Yes.    Have you been able to return to your normal activities? Yes.    Do you have any questions about your discharge instructions: Diet   No. Medications  No. Follow up visit  No.  Do you have questions or concerns about your Care? No.  Actions: * If pain score is 4 or above: No action needed, pain <4.

## 2014-03-22 ENCOUNTER — Encounter: Payer: Self-pay | Admitting: Internal Medicine

## 2014-04-13 ENCOUNTER — Other Ambulatory Visit: Payer: Self-pay | Admitting: Cardiology

## 2014-04-13 DIAGNOSIS — G4700 Insomnia, unspecified: Secondary | ICD-10-CM

## 2014-04-13 NOTE — Telephone Encounter (Signed)
New message     Ref[facility] generic ambien [facility]

## 2014-04-14 MED ORDER — ZOLPIDEM TARTRATE 5 MG PO TABS: 5.0000 mg | ORAL_TABLET | Freq: Every evening | ORAL | Status: DC | PRN

## 2014-04-14 NOTE — Telephone Encounter (Signed)
Left message at patients home Rx done

## 2014-04-14 NOTE — Telephone Encounter (Signed)
Called to pharmacy as requested  

## 2014-04-14 NOTE — Telephone Encounter (Signed)
Okay to refill ambien 

## 2014-10-24 ENCOUNTER — Other Ambulatory Visit: Payer: Self-pay | Admitting: Cardiology

## 2014-12-01 ENCOUNTER — Other Ambulatory Visit (INDEPENDENT_AMBULATORY_CARE_PROVIDER_SITE_OTHER): Payer: Medicare HMO | Admitting: *Deleted

## 2014-12-01 DIAGNOSIS — I119 Hypertensive heart disease without heart failure: Secondary | ICD-10-CM

## 2014-12-01 DIAGNOSIS — E7800 Pure hypercholesterolemia, unspecified: Secondary | ICD-10-CM

## 2014-12-01 LAB — HEPATIC FUNCTION PANEL
ALBUMIN: 3.9 g/dL (ref 3.5–5.2)
ALK PHOS: 114 U/L (ref 39–117)
ALT: 16 U/L (ref 0–35)
AST: 23 U/L (ref 0–37)
Bilirubin, Direct: 0.2 mg/dL (ref 0.0–0.3)
TOTAL PROTEIN: 7.7 g/dL (ref 6.0–8.3)
Total Bilirubin: 1.1 mg/dL (ref 0.2–1.2)

## 2014-12-01 LAB — BASIC METABOLIC PANEL
BUN: 14 mg/dL (ref 6–23)
CO2: 30 mEq/L (ref 19–32)
Calcium: 9.6 mg/dL (ref 8.4–10.5)
Chloride: 101 mEq/L (ref 96–112)
Creatinine, Ser: 0.81 mg/dL (ref 0.40–1.20)
GFR: 73.36 mL/min (ref >60.00)
Glucose, Bld: 101 mg/dL — ABNORMAL HIGH (ref 70–99)
Potassium: 4.2 mEq/L (ref 3.5–5.1)
SODIUM: 138 meq/L (ref 135–145)

## 2014-12-01 LAB — LIPID PANEL
Cholesterol: 151 mg/dL (ref 0–200)
HDL: 40.2 mg/dL (ref >39.00)
LDL Cholesterol: 81 mg/dL (ref 0–99)
NONHDL: 110.8
Total CHOL/HDL Ratio: 4
Triglycerides: 151 mg/dL — ABNORMAL HIGH (ref 0.0–149.0)
VLDL: 30.2 mg/dL (ref 0.0–40.0)

## 2014-12-04 NOTE — Progress Notes (Signed)
Quick Note:  Please make copy of labs for patient visit. ______ 

## 2014-12-05 ENCOUNTER — Other Ambulatory Visit: Payer: Private Health Insurance - Indemnity

## 2014-12-05 ENCOUNTER — Ambulatory Visit (INDEPENDENT_AMBULATORY_CARE_PROVIDER_SITE_OTHER): Payer: Medicare HMO | Admitting: Cardiology

## 2014-12-05 ENCOUNTER — Encounter: Payer: Self-pay | Admitting: Cardiology

## 2014-12-05 VITALS — BP 148/80 | HR 101 | Ht 67.0 in | Wt 210.8 lb

## 2014-12-05 DIAGNOSIS — I119 Hypertensive heart disease without heart failure: Secondary | ICD-10-CM | POA: Diagnosis not present

## 2014-12-05 DIAGNOSIS — E7800 Pure hypercholesterolemia, unspecified: Secondary | ICD-10-CM

## 2014-12-05 NOTE — Progress Notes (Signed)
Cardiology Office Note   Date:  12/05/2014   ID:  Laura, Chambers [birth year], MRN [id number]  PCP:  No PCP Per Patient  Cardiologist: Darlin Coco MD  No chief complaint on file.     History of Present Illness: Laura Chambers is a 74 y.o. female who presents for a one-year follow-up office visit.  This pleasant 74 year old woman is seen for a scheduled followup office visit. She has a past history of essential hypertension and hypercholesterolemia. He does not have any history of ischemic heart disease. . She underwent successful back surgery in August 2012 by Dr. Annette Stable. In addition she has had both knees replaced and has had a right hip replacement. Dr. [name] is her orthopedist. The patient has a history of peripheral neuropathy which she relates to her knee surgery and her back surgery. 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 She is tired at the end of the day . She has not been having any chest pain or shortness of breath. She has been having occasional leg cramps at night and occasionally feels lightheaded. She notes that her blood pressure has been running a little more than usual.  Patient has a history of a hiatal hernia and is on Prilosec OTC. 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 Her blood pressure was slightly elevated today.  She had had a hectic day at work.  It was the last day of the month and they were trying to close the books.  At home her blood pressure runs normal.  Past Medical History  Diagnosis Date  . HTN (hypertension)   . Hypercholesterolemia   . Obesity   . Hx of colonic polyps   . Renal angiomyolipoma   . Anxiety   . Hydronephrosis of right kidney   . Esophageal hernia   . 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 Arthritis   . Fibromyalgia   . Complication of anesthesia  2012    "irritated trachea" with cough x 1 year from intubation  . Osteoarthritis (arthritis due to wear and tear of joints)     Past Surgical History  Procedure Laterality Date  . Total knee arthroplasty  2001    left  . Cholecystectomy  2010  . Tonsillectomy  1946  . Abdominal hysterectomy  1969  . Paraesophageal hernia repair  1989  . Breast biopsy  1989  . Nissen fundoplication    . Total knee arthroplasty  2010    right  . Blephoroplasty      bilateral  . Back surgery  2012    rod in back-  lumbar disc removal with bone graft  . Total hip arthroplasty Right 08/31/2012    Procedure: TOTAL HIP ARTHROPLASTY ANTERIOR APPROACH;  Surgeon: Mauri Pole, MD;  Location: [facility];  Service: Orthopedics;  Laterality: Right;     Current Outpatient Prescriptions  Medication Sig Dispense Refill  . aspirin 81 MG tablet Take 81 mg by mouth daily.    Marland Kitchen atenolol (TENORMIN) 25 MG tablet TAKE 1 TABLET (25 MG TOTAL) BY MOUTH DAILY. 90 tablet 3  . Calcium-Magnesium-Vitamin D [phone] MG-MG-UNIT TABS Take 1 tablet by mouth daily.     . CRESTOR 10 MG tablet TAKE 1 TABLET BY MOUTH DAILY. 90 tablet 1  . folic acid (FOLVITE) 782 MCG tablet Take 400 mcg  by mouth daily.    Marland Kitchen gabapentin (NEURONTIN) 300 MG capsule Take 300 mg by mouth daily.    Marland Kitchen losartan-hydrochlorothiazide (HYZAAR) 50-12.5 MG per tablet TAKE 1 TABLET BY MOUTH ONCE DAILY 90 tablet 3  . Multiple Vitamin (MULTIVITAMIN) tablet Take 1 tablet by mouth daily.     No current facility-administered medications for this visit.    Allergies:   Epinephrine; Levaquin; Lipitor; and Trovan    Social History:  The patient  reports that she has never smoked. She has never used smokeless tobacco. She reports that she does not drink alcohol or use illicit drugs.   Family History:  The patient's family history includes Colon cancer (age of onset: 47) in her sister; Colon polyps in an other family member; Diabetes in her brother, father, and  sister; Heart disease in her father; Osteoarthritis in her father and mother; Stroke in her father and mother.    ROS:  Please see the history of present illness.   Otherwise, review of systems are positive for none.   All other systems are reviewed and negative.    PHYSICAL EXAM: VS:  BP 148/80 mmHg  Pulse 101  Ht 5\' 7"  (1.702 m)  Wt 210 lb 12.8 oz (95.618 kg)  BMI 33.01 kg/m2 , BMI Body mass index is 33.01 kg/(m^2). GEN: Well nourished, well developed, in no acute distress HEENT: normal Neck: no JVD, carotid bruits, or masses Cardiac: RRR; no murmurs, rubs, or gallops,no edema  Respiratory:  clear to auscultation bilaterally, normal work of breathing GI: soft, nontender, nondistended, + BS MS: no deformity or atrophy Skin: warm and dry, no rash Neuro:  Strength and sensation are intact Psych: euthymic mood, full affect   EKG:  EKG is ordered today. The ekg ordered today demonstrates sinus tachycardia and since the previous tracing of 06/16/12, no significant change except heart rate is faster at 101 bpm   Recent Labs: 12/01/2014: ALT 16; BUN 14; Creatinine 0.81; Potassium 4.2; Sodium 138    Lipid Panel    Component Value Date/Time   CHOL 151 12/01/2014 1301   TRIG 151.0* 12/01/2014 1301   HDL 40.20 12/01/2014 1301   CHOLHDL 4 12/01/2014 1301   VLDL 30.2 12/01/2014 1301   LDLCALC 81 12/01/2014 1301      Wt Readings from Last 3 Encounters:  12/05/14 210 lb 12.8 oz (95.618 kg)  01/25/14 216 lb (97.977 kg)  01/12/14 209 lb (94.802 kg)         ASSESSMENT AND PLAN:  1. hypertensive heart disease without heart failure 2. Dyslipidemia 3. peripheral neuropathy 4. 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 osteoarthritis with previous bilateral knee replacement and right hip replacement.  Plan: Continue same medication. She will work harder on weight loss. Her weight is up 3 pounds. Recheck in  6 months for office visit  lipid panel hepatic function panel and basal metabolic panel. Avoid dietary salt.  Avoid free sweets and sugars and sodas.  Current medicines are reviewed at length with the patient today.  The patient does not have concerns regarding medicines.  The following changes have been made:  no change  Labs/ tests ordered today include:  No orders of the defined types were placed in this encounter.     Berna Spare MD 12/05/2014 4:48 PM    [address], [place], [place]  [address] Phone: [phone]; Fax: [phone]

## 2014-12-05 NOTE — Patient Instructions (Signed)
Medication Instructions:  Your physician recommends that you continue on your current medications as directed. Please refer to the Current Medication list given to you today.   Labwork: none  Testing/Procedures: none  Follow-Up: Your physician wants you to follow-up in: 6 months with fasting labs (lp/bmet/hfp)  You will receive a reminder letter in the mail two months in advance. If you don't receive a letter, please call our office to schedule the follow-up appointment.   Any Other Special Instructions Will Be Listed Below (If Applicable). Work harder on diet and weight loss

## 2015-01-25 ENCOUNTER — Other Ambulatory Visit: Payer: Self-pay | Admitting: Cardiology

## 2015-02-27 ENCOUNTER — Other Ambulatory Visit: Payer: Self-pay | Admitting: Cardiology

## 2015-04-03 ENCOUNTER — Telehealth: Payer: Self-pay | Admitting: Cardiology

## 2015-04-03 ENCOUNTER — Telehealth: Payer: Self-pay

## 2015-04-03 NOTE — Telephone Encounter (Signed)
Patient called for samples of Crestor. I explained we no longer get samples and she stated her understanding.

## 2015-04-03 NOTE — Telephone Encounter (Deleted)
error 

## 2015-04-20 ENCOUNTER — Ambulatory Visit (INDEPENDENT_AMBULATORY_CARE_PROVIDER_SITE_OTHER): Payer: Medicare HMO | Admitting: Neurology

## 2015-04-20 ENCOUNTER — Encounter: Payer: Self-pay | Admitting: Neurology

## 2015-04-20 VITALS — BP 132/79 | HR 99 | Ht 67.0 in | Wt 219.6 lb

## 2015-04-20 DIAGNOSIS — R51 Headache: Secondary | ICD-10-CM

## 2015-04-20 DIAGNOSIS — G500 Trigeminal neuralgia: Secondary | ICD-10-CM | POA: Diagnosis not present

## 2015-04-20 DIAGNOSIS — H539 Unspecified visual disturbance: Secondary | ICD-10-CM | POA: Diagnosis not present

## 2015-04-20 DIAGNOSIS — R519 Headache, unspecified: Secondary | ICD-10-CM

## 2015-04-20 MED ORDER — GABAPENTIN 300 MG PO CAPS: 600.0000 mg | ORAL_CAPSULE | Freq: Three times a day (TID) | ORAL | Status: DC

## 2015-04-20 NOTE — Progress Notes (Signed)
[id number]    Provider:  Dr Jaynee Eagles Referring Provider: Thornell Sartorius, MD Primary Care Physician:  Warren Danes, MD  CC:  Headaches   HPI:  Laura Chambers is awonderful  74 y.o. female here as a referral from Dr. Ernesto Rutherford for headaches.  She looks much younger than stated age. 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 She sees Dr. Ernesto Rutherford. She has always had pain in her right ear, chronically. About a year ago she had a blocked ear and had treatment for it. Since then she has pain in the right temporal area in the temple area and the right side of the head abd around the right eye. She has shooting pain, electric pain, a sensation that wakes her up at night. Waves of pain, not a stabbing pain. No rhinorrhea or lacrimation. The right ear has been popping some. She has pain behind the right ear. The pain can continue all day sometimes. He right temporal scalp is sensitive to the touch. Last night when she took a shower, when she bent over to picl up the soap she had the sensation of pain around the right eye and temple areas . When she firsts goes to bed, she notices it more and she has thumping in her ears. Symptoms improve sometimes if she can out some pressure on the area. A lot of times she puts her hands on the side of her face to cradle the area of pain. She has temderness occasionally in front of the ear. No nausea, no vonmiting, no light sensitivity, nno sound sensitivty. She had headaches years ago and it was on the right side, debilitating which resolved and she doesn;t have those headaches anymore. She has TMJ. No vision changes with the headaches, she has a cataract in the right eye. No pain when she chews. No recent fevers or stiffness in the shoulders.   Reviewed notes, labs and imaging from outside  physicians, which showed: Patient is a referral from Dr. Minna Merritts. Her previous issues have been chronic frontal, maxillary and ethmoid sinusitis and chronic serous otitis. CT temporal bone back in 2012 showed mastoid tip air space opacification inflammatory changes. Her last CT sinus was on 07/11/2014 and it did show some minimal bifrontal and ethmoid, right sphenoid sinus disease with slight air-fluid levels, minimal right mastoid effusion suggested, and mild degenerative changes of TMJ. She was diagnosed with mild serous otitis on the right but sinusitis, maxillary, ethmoid and frontal more in the right than left. She was placed on Zithromax and Mucinex.  Review of Systems: Patient complains of symptoms per HPI as well as the following symptoms: Blurred vision, eye pain, joint pain, cramps, ringing in ears, allergies, headache, insomnia, dizziness. Pertinent negatives per HPI. All others negative.   Social History   Social History  . Marital Status: Divorced    Spouse Name: N/A  . Number of Children: 3  . Years of Education: College   Occupational History  .  [employer]    Dr. Minna Merritts   Social History Main Topics  . Smoking status: Never Smoker   . Smokeless tobacco: Never Used  . Alcohol Use: No  . Drug Use: No  . Sexual Activity: Not on file   Other Topics Concern  . Not on file   Social History Narrative   Patient lives at home alone.  Caffeine Use: 1 cup daily    Family History  Problem Relation Age of Onset  . Diabetes Father   . Osteoarthritis Father   . Heart disease Father   . Stroke Father   . Osteoarthritis Mother   . Stroke Mother   . Pneumonia Mother   . Colon cancer Sister 106  . Colon polyps      neice/nephew  . Diabetes Sister   . Diabetes Brother     Past Medical History  Diagnosis Date  . HTN (hypertension)   . Hypercholesterolemia   . Obesity   . Hx of colonic polyps   . Renal angiomyolipoma   . Anxiety   . Hydronephrosis of  right kidney   . Esophageal hernia   . 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 Arthritis   . Fibromyalgia   . Complication of anesthesia 2012    "irritated trachea" with cough x 1 year from intubation  . Osteoarthritis (arthritis due to wear and tear of joints)     Past Surgical History  Procedure Laterality Date  . Total knee arthroplasty  2001    left  . Cholecystectomy  2010  . Tonsillectomy  1946  . Abdominal hysterectomy  1969  . Paraesophageal hernia repair  1989  . Breast biopsy  1989  . Nissen fundoplication    . Total knee arthroplasty  2010    right  . Blephoroplasty      bilateral  . Back surgery  2012    rod in back-  lumbar disc removal with bone graft  . Total hip arthroplasty Right 08/31/2012    Procedure: TOTAL HIP ARTHROPLASTY ANTERIOR APPROACH;  Surgeon: Mauri Pole, MD;  Location: [facility];  Service: Orthopedics;  Laterality: Right;    Current Outpatient Prescriptions  Medication Sig Dispense Refill  . aspirin 81 MG tablet Take 81 mg by mouth daily.    Marland Kitchen atenolol (TENORMIN) 25 MG tablet TAKE 1 TABLET BY MOUTH DAILY. 90 tablet 0  . Calcium-Magnesium-Vitamin D 833-825-053 MG-MG-UNIT TABS Take 1 tablet by mouth daily.     . CRESTOR 10 MG tablet TAKE 1 TABLET BY MOUTH DAILY. 90 tablet 1  . folic acid (FOLVITE) 976 MCG tablet Take 400 mcg by mouth daily.    Marland Kitchen gabapentin (NEURONTIN) 300 MG capsule Take 2 capsules (600 mg total) by mouth 3 (three) times daily. 180 capsule 11  . losartan-hydrochlorothiazide (HYZAAR) 50-12.5 MG per tablet TAKE 1 TABLET BY MOUTH ONCE DAILY 90 tablet 3  . Multiple Vitamin (MULTIVITAMIN) tablet Take 1 tablet by mouth daily.     No current facility-administered medications for this visit.    Allergies as of 04/20/2015 - Review Complete 12/05/2014  Allergen Reaction Noted  . Epinephrine Other (See Comments) 02/04/2012  . Levaquin [levofloxacin in d5w] Nausea And Vomiting 08/19/2012  . Lipitor  [atorvastatin]  04/08/2013  . Trovan [alatrofloxacin] Nausea And Vomiting 02/04/2012    Vitals: BP 132/79 mmHg  Pulse 99  Ht 5\' 7"  (1.702 m)  Wt 219 lb 9.6 oz (99.61 kg)  BMI 34.39 kg/m2 Last Weight:  Wt Readings from Last 1 Encounters:  04/20/15 219 lb 9.6 oz (99.61 kg)   Last Height:   Ht Readings from Last 1 Encounters:  04/20/15 5\' 7"  (1.702 m)     Physical exam: Exam: Gen: NAD, conversant, well groomed  CV: RRR, no MRG. No Carotid Bruits. No peripheral edema, warm, nontender Eyes: Conjunctivae clear without exudates or hemorrhage  Neuro: Detailed Neurologic Exam  Speech:    Speech is normal; fluent and spontaneous with normal comprehension.  Cognition:    The patient is oriented to person, place, and time;     recent and remote memory intact;     language fluent;     normal attention, concentration,     fund of knowledge Cranial Nerves:    The pupils are equal, round, and reactive to light. The fundi are normal and spontaneous venous pulsations are present. Visual fields are full to finger confrontation. Extraocular movements are intact. Trigeminal sensation is intact and the muscles of mastication are normal. The face is symmetric. The palate elevates in the midline. Hearing intact. Voice is normal. Shoulder shrug is normal. The tongue has normal motion without fasciculations.   Coordination:    Normal finger to nose and heel to shin.   Gait:    Normal native gait  Motor Observation:    No asymmetry, no atrophy, and no involuntary movements noted. Tone:    Normal muscle tone.    Posture:    Posture is normal. normal erect    Strength:    Strength is V/V in the upper and lower limbs.      Sensation: intact to LT     Reflex Exam:  DTR's:    Deep tendon reflexes in the upper and lower extremities are normal bilaterally.   Toes:    The toes are downgoing bilaterally.   Clonus:    Clonus is absent.     Assessment/Plan:  This is  a wonderful  74 y.o. female here as a referral from Dr. Ernesto Rutherford for headaches. 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  She looks much younger than stated age. Patient reports paroxysmal, stereotyped attacks of shooting, electric pain in the distribution of the right trigeminal nerve. She also appears to have episodes of hours of continuous pain which can also be seen with long-standing trigeminal neuralgia. I had a long talk with the patient regarding trigeminal neuralgia. Do feel that MRI of the brain with and without contrast is indicated to evaluate for possible ischemic or hemorrhagic lesions of the brainstem that can cause isolated cranial nerve palsies; small lesions of the brainstem can affect the trigeminal nerve only. Other causes include including traumatic, vascular,inflammatory, demyelinating, infectious, and neoplastic. She can increase her neurontin for pain up to three times a day and follow up with me in 3 months.  Also discussed possible trigeminal nerve blocks in the future. Much appeciate my colleague Dr. Minna Merritts.   Laura Ill, MD  [facility] [address] [place], [place] [address]  Phone [phone] Fax [phone]

## 2015-04-20 NOTE — Patient Instructions (Addendum)
Overall you are doing very well but I do want to suggest a few things today:   Remember to drink plenty of fluid, eat healthy meals and do not skip any meals. Try to eat protein with a every meal and eat a healthy snack such as fruit or nuts in between meals. Try to keep a regular sleep-wake schedule and try to exercise daily, particularly in the form of walking, 20-30 minutes a day, if you can.   As far as your medications are concerned, I would like to suggest: Neurontin up 600mg  three times a day as needed  As far as diagnostic testing: MRI of the brain, labs  I would like to see you back in 3 months, sooner if we need to. Please call us with any interim questions, concerns, problems, updates or refill requests.   Please also call us for any test results so we can go over those with you on the phone.  My clinical assistant and will answer any of your questions and relay your messages to me and also relay most of my messages to you.   Our phone number is [phone]. We also have an after hours call service for urgent matters and there is a physician on-call for urgent questions. For any emergencies you know to call 911 or go to the nearest emergency room

## 2015-04-21 LAB — BASIC METABOLIC PANEL
BUN/Creatinine Ratio: 22 (ref 11–26)
BUN: 18 mg/dL (ref 8–27)
CALCIUM: 9.6 mg/dL (ref 8.7–10.3)
CHLORIDE: 101 mmol/L (ref 97–108)
CO2: 27 mmol/L (ref 18–29)
CREATININE: 0.81 mg/dL (ref 0.57–1.00)
GFR calc Af Amer: 83 mL/min/{1.73_m2} (ref >59)
GFR calc non Af Amer: 72 mL/min/{1.73_m2} (ref >59)
GLUCOSE: 87 mg/dL (ref 65–99)
Potassium: 4.7 mmol/L (ref 3.5–5.2)
Sodium: 142 mmol/L (ref 134–144)

## 2015-04-21 LAB — C-REACTIVE PROTEIN: CRP: 2.9 mg/L (ref 0.0–4.9)

## 2015-04-21 LAB — SEDIMENTATION RATE: SED RATE: 6 mm/h (ref 0–40)

## 2015-04-23 ENCOUNTER — Telehealth: Payer: Self-pay | Admitting: *Deleted

## 2015-04-23 NOTE — Telephone Encounter (Signed)
-----   Message from Melvenia Beam, MD sent at 04/21/2015 11:10 AM EDT ----- Let patient know her labs normal thanks

## 2015-04-23 NOTE — Telephone Encounter (Signed)
Spoke w/ pt about normal lab results. She verbalized understanding. Knows to call back if she has further questions.

## 2015-04-26 ENCOUNTER — Other Ambulatory Visit: Payer: Self-pay | Admitting: Cardiology

## 2015-04-26 ENCOUNTER — Telehealth: Payer: Self-pay | Admitting: Neurology

## 2015-04-26 NOTE — Telephone Encounter (Signed)
Laura Chambers with [facility] called needing orders for MRI of the orbits with/without. Please call and advise at [phone]

## 2015-04-26 NOTE — Telephone Encounter (Signed)
Called and spoke w/ Museum/gallery conservator from [facility]. Advised that pt declined wanting test at this time. Asked to cancel that. She also said Dr. Jaynee Eagles needs to cancel her MRI order if she does not want that one.

## 2015-04-26 NOTE — Telephone Encounter (Signed)
She is canceling, Laura Chambers please let them know thanks

## 2015-04-26 NOTE — Telephone Encounter (Signed)
That's fine. Please let andrea know thanks.

## 2015-04-26 NOTE — Telephone Encounter (Signed)
Patient called stating that she is feeling much better since increasing her gabapentin dosage. She would like to hold off on her MRI BRAIN W/WO for now.

## 2015-04-27 ENCOUNTER — Telehealth: Payer: Self-pay | Admitting: Neurology

## 2015-04-27 NOTE — Telephone Encounter (Signed)
[facility], Olin Hauser, called and states that pts mri needs to be of mri of the orbits with and without contrast. If Dr. Jaynee Eagles is looking for other things then an order for MRI with and without contrast as well as MRI of Orbits with and without contrast will be needed. Please call and advise [phone]- Please ask for Olin Hauser.

## 2015-04-30 NOTE — Telephone Encounter (Signed)
Dr. Jaynee Eagles-- yes you told me she was feeling better later week and declined it.

## 2015-04-30 NOTE — Telephone Encounter (Signed)
Called Olin Hauser back at [facility]. She stated they normally order MRI orbits with MRI brain for trigeminal neuralgia. Since pt declined MRI brain at this time, they do not need to do MRI orbits.

## 2015-04-30 NOTE — Telephone Encounter (Signed)
She declined MRI of the brain? thanks

## 2015-04-30 NOTE — Telephone Encounter (Signed)
Laura Chambers - can you find out why they need an MRi of the orbits? I did not order an MRi of the orbits. I ordered an MRI of the brain w/wo contrast with a trigeminal nerve protocol. Let me know, thanks

## 2015-05-18 ENCOUNTER — Encounter: Payer: Self-pay | Admitting: Cardiology

## 2015-05-23 ENCOUNTER — Encounter: Payer: Self-pay | Admitting: Cardiology

## 2015-06-04 ENCOUNTER — Encounter: Payer: Self-pay | Admitting: Cardiology

## 2015-06-04 ENCOUNTER — Other Ambulatory Visit: Payer: Self-pay | Admitting: Cardiology

## 2015-06-06 ENCOUNTER — Ambulatory Visit: Payer: Medicare HMO | Admitting: Cardiology

## 2015-07-05 ENCOUNTER — Telehealth: Payer: Self-pay | Admitting: Cardiology

## 2015-07-12 ENCOUNTER — Ambulatory Visit: Payer: Medicare HMO | Admitting: Cardiology

## 2015-07-30 MED FILL — ROSUVASTATIN CALCIUM 10 MG: 10 | 30 days supply | Qty: 30 | Fill #3

## 2015-07-30 MED FILL — LOSARTAN-HCTZ 50-12.5 MG TA: 50-12.5 | 90 days supply | Qty: 90 | Fill #2

## 2015-08-02 ENCOUNTER — Ambulatory Visit: Payer: Medicare HMO | Admitting: Neurology

## 2015-08-07 MED FILL — PROMETHAZINE 25 MG TABLET: 25 | 8 days supply | Qty: 30 | Fill #0

## 2015-08-15 ENCOUNTER — Encounter: Payer: Self-pay | Admitting: Neurology

## 2015-08-15 ENCOUNTER — Ambulatory Visit (INDEPENDENT_AMBULATORY_CARE_PROVIDER_SITE_OTHER): Payer: PPO | Admitting: Neurology

## 2015-08-15 VITALS — BP 143/84 | HR 90 | Ht 67.0 in | Wt 217.8 lb

## 2015-08-15 DIAGNOSIS — M542 Cervicalgia: Secondary | ICD-10-CM | POA: Diagnosis not present

## 2015-08-15 DIAGNOSIS — G500 Trigeminal neuralgia: Secondary | ICD-10-CM | POA: Diagnosis not present

## 2015-08-15 MED ORDER — BACLOFEN 10 MG PO TABS: 10.0000 mg | ORAL_TABLET | Freq: Three times a day (TID) | ORAL | Status: DC

## 2015-08-15 MED FILL — BACLOFEN 10 MG TABLET: 10 | 30 days supply | Qty: 90 | Fill #0

## 2015-08-15 NOTE — Patient Instructions (Signed)
Remember to drink plenty of fluid, eat healthy meals and do not skip any meals. Try to eat protein with a every meal and eat a healthy snack such as fruit or nuts in between meals. Try to keep a regular sleep-wake schedule and try to exercise daily, particularly in the form of walking, 20-30 minutes a day, if you can.   As far as your medications are concerned, I would like to suggest: Continue Neurontin Baclofen for muscle spasms as well as trigeminal neuralgia  As far as diagnostic testing:   I would like to see you back as needed, sooner if we need to. Please call us with any interim questions, concerns, problems, updates or refill requests.   Our phone number is [phone]. We also have an after hours call service for urgent matters and there is a physician on-call for urgent questions. For any emergencies you know to call 911 or go to the nearest emergency room

## 2015-08-15 NOTE — Progress Notes (Signed)
[id number]    Provider:  Dr Jaynee Eagles Referring Provider: Darlin Coco, MD Primary Care Physician:  Warren Danes, MD  CC: Headaches   Interval history 08/15/2015: She is feeling better. Increased neurontin to 300mg  thre times a day and it helped. She takes it at least twice a day and was doing well. She had an episode of neck muscle spasms and she took muscle relaxers and that helped . She still has difficulty with neck muscle stiffness. She would have radiating pain up the occipital area to the top of the head.Will gice baclofen which will help with the neck muscle spasms as well as a treatment for trigeminal neuralgia.   HPI: Laura Chambers is awonderful 75 y.o. female here as a referral from Dr. Ernesto Rutherford for headaches. She looks much younger than stated age. 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 She sees Dr. Ernesto Rutherford. She has always had pain in her right ear, chronically. About a year ago she had a blocked ear and had treatment for it. Since then she has pain in the right temporal area in the temple area and the right side of the head abd around the right eye. She has shooting pain, electric pain, a sensation that wakes her up at night. Waves of pain, not a stabbing pain. No rhinorrhea or lacrimation. The right ear has been popping some. She has pain behind the right ear. The pain can continue all day sometimes. He right temporal scalp is sensitive to the touch. Last night when she took a shower, when she bent over to picl up the soap she had the sensation of pain around the right eye and temple areas . When she firsts goes to bed, she notices it more and she has thumping in her ears. Symptoms improve sometimes if she can out some pressure on the area. A lot of times she puts her hands on the side of her face to  cradle the area of pain. She has temderness occasionally in front of the ear. No nausea, no vonmiting, no light sensitivity, nno sound sensitivty. She had headaches years ago and it was on the right side, debilitating which resolved and she doesn;t have those headaches anymore. She has TMJ. No vision changes with the headaches, she has a cataract in the right eye. No pain when she chews. No recent fevers or stiffness in the shoulders.   Reviewed notes, labs and imaging from outside physicians, which showed: Patient is a referral from Dr. Minna Merritts. Her previous issues have been chronic frontal, maxillary and ethmoid sinusitis and chronic serous otitis. CT temporal bone back in 2012 showed mastoid tip air space opacification inflammatory changes. Her last CT sinus was on 07/11/2014 and it did show some minimal bifrontal and ethmoid, right sphenoid sinus disease with slight air-fluid levels, minimal right mastoid effusion suggested, and mild degenerative changes of TMJ. She was diagnosed with mild serous otitis on the right but sinusitis, maxillary, ethmoid and frontal more in the right than left. She was placed on Zithromax and Mucinex.   Social History   Social History  . Marital Status: Divorced    Spouse Name: N/A  . Number of Children: 3  . Years of Education: College   Occupational History  .  [employer]    Dr. Minna Merritts   Social History Main Topics  . Smoking status: Never  Smoker   . Smokeless tobacco: Never Used  . Alcohol Use: No  . Drug Use: No  . Sexual Activity: Not on file   Other Topics Concern  . Not on file   Social History Narrative   Patient lives at home alone.   Caffeine Use: 1 cup daily    Family History  Problem Relation Age of Onset  . Diabetes Father   . Osteoarthritis Father   . Heart disease Father   . Stroke Father   . Osteoarthritis Mother   . Stroke Mother   . Pneumonia Mother   . Colon cancer Sister 80  . Colon polyps      neice/nephew   . Diabetes Sister   . Diabetes Brother     Past Medical History  Diagnosis Date  . HTN (hypertension)   . Hypercholesterolemia   . Obesity   . Hx of colonic polyps   . Renal angiomyolipoma   . Anxiety   . Hydronephrosis of right kidney   . Esophageal hernia   . 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 Arthritis   . Fibromyalgia   . Complication of anesthesia 2012    "irritated trachea" with cough x 1 year from intubation  . Osteoarthritis (arthritis due to wear and tear of joints)     Past Surgical History  Procedure Laterality Date  . Total knee arthroplasty  2001    left  . Cholecystectomy  2010  . Tonsillectomy  1946  . Abdominal hysterectomy  1969  . Paraesophageal hernia repair  1989  . Breast biopsy  1989  . Nissen fundoplication    . Total knee arthroplasty  2010    right  . Blephoroplasty      bilateral  . Back surgery  2012    rod in back-  lumbar disc removal with bone graft  . Total hip arthroplasty Right 08/31/2012    Procedure: TOTAL HIP ARTHROPLASTY ANTERIOR APPROACH;  Surgeon: Mauri Pole, MD;  Location: [facility];  Service: Orthopedics;  Laterality: Right;    Current Outpatient Prescriptions  Medication Sig Dispense Refill  . aspirin 81 MG tablet Take 81 mg by mouth daily.    Marland Kitchen atenolol (TENORMIN) 25 MG tablet TAKE 1 TABLET BY MOUTH DAILY. 90 tablet 1  . Calcium-Magnesium-Vitamin D [id number] MG-MG-UNIT TABS Take 1 tablet by mouth daily.     . folic acid (FOLVITE) [id number] MCG tablet Take 400 mcg by mouth daily.    Marland Kitchen gabapentin (NEURONTIN) 300 MG capsule Take 2 capsules (600 mg total) by mouth 3 (three) times daily. 180 capsule 11  . losartan-hydrochlorothiazide (HYZAAR) 50-12.5 MG per tablet TAKE 1 TABLET BY MOUTH ONCE DAILY 90 tablet 3  . Multiple Vitamin (MULTIVITAMIN) tablet Take 1 tablet by mouth daily.    . promethazine (PHENERGAN) 25 MG tablet Take 25 mg by mouth as needed.  0  . rosuvastatin (CRESTOR) 10 MG tablet  TAKE 1 TABLET BY MOUTH DAILY. 90 tablet 1   No current facility-administered medications for this visit.    Allergies as of 08/15/2015 - Review Complete 08/15/2015  Allergen Reaction Noted  . Epinephrine Other (See Comments) 02/04/2012  . Levaquin [levofloxacin in d5w] Nausea And Vomiting 08/19/2012  . Lipitor [atorvastatin]  04/08/2013  . Trovan [alatrofloxacin] Nausea And Vomiting 02/04/2012    Vitals: BP 143/84 mmHg  Pulse 90  Ht 5\' 7"  (1.702 m)  Wt 217 lb 12.8 oz (98.793 kg)  BMI 34.10  kg/m2 Last Weight:  Wt Readings from Last 1 Encounters:  08/15/15 217 lb 12.8 oz (98.793 kg)   Last Height:   Ht Readings from Last 1 Encounters:  08/15/15 5\' 7"  (1.702 m)   Physical exam: Exam: Gen: NAD, conversant, well groomed  CV: RRR, no MRG. No Carotid Bruits. No peripheral edema, warm, nontender Eyes: Conjunctivae clear without exudates or hemorrhage  Neuro: Detailed Neurologic Exam  Speech:  Speech is normal; fluent and spontaneous with normal comprehension.  Cognition:  The patient is oriented to person, place, and time;   recent and remote memory intact;   language fluent;   normal attention, concentration,   fund of knowledge Cranial Nerves:  The pupils are equal, round, and reactive to light. The fundi are normal and spontaneous venous pulsations are present. Visual fields are full to finger confrontation. Extraocular movements are intact. Trigeminal sensation is intact and the muscles of mastication are normal. The face is symmetric. The palate elevates in the midline. Hearing intact. Voice is normal. Shoulder shrug is normal. The tongue has normal motion without fasciculations.   Coordination:  Normal finger to nose and heel to shin.   Gait:  Normal native gait  Motor Observation:  No asymmetry, no atrophy, and no involuntary movements noted. Tone:  Normal muscle tone.   Posture:  Posture is normal. normal  erect   Strength:  Strength is V/V in the upper and lower limbs.    Sensation: intact to LT   Reflex Exam:  DTR's:  Deep tendon reflexes in the upper and lower extremities are normal bilaterally.  Toes:  The toes are downgoing bilaterally.  Clonus:  Clonus is absent.    Assessment/Plan: This is a wonderful 75 y.o. female here as a referral from Dr. Ernesto Rutherford for headaches. 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 She looks much younger than stated age. Patient reports paroxysmal, stereotyped attacks of shooting, electric pain in the distribution of the right trigeminal nerve. She also appears to have episodes of hours of continuous pain which can also be seen with long-standing trigeminal neuralgia. I had a long talk with the patient regarding trigeminal neuralgia. Do feel that MRI of the brain with and without contrast is indicated to evaluate for possible ischemic or hemorrhagic lesions of the brainstem that can cause isolated cranial nerve palsies; small lesions of the brainstem can affect the trigeminal nerve only. Other causes include including traumatic, vascular,inflammatory, demyelinating, infectious, and neoplastic. She can increase her neurontin for pain up to three times a day and follow up with me in 3 months. Also discussed possible trigeminal nerve blocks in the future. Much appeciate my colleague Dr. Minna Merritts.   As far as your medications are concerned, I would like to suggest: Continue Neurontin Baclofen for muscle spasms as well as trigeminal neuralgia   Sarina Ill, MD  [facility] [address] [place], [place] [address]  Phone [phone] Fax [phone]  A total of 30 minutes was spent face-to-face with this patient. Over half this time was  spent on counseling patient on the trigeminal neuropathy diagnosis and different diagnostic and therapeutic options available.

## 2015-08-17 ENCOUNTER — Ambulatory Visit: Payer: PPO | Admitting: Cardiology

## 2015-08-17 ENCOUNTER — Other Ambulatory Visit: Payer: Self-pay | Admitting: *Deleted

## 2015-08-17 DIAGNOSIS — E7800 Pure hypercholesterolemia, unspecified: Secondary | ICD-10-CM

## 2015-08-17 DIAGNOSIS — I119 Hypertensive heart disease without heart failure: Secondary | ICD-10-CM

## 2015-08-19 DIAGNOSIS — G500 Trigeminal neuralgia: Secondary | ICD-10-CM | POA: Insufficient documentation

## 2015-08-19 DIAGNOSIS — M542 Cervicalgia: Secondary | ICD-10-CM | POA: Insufficient documentation

## 2015-08-20 ENCOUNTER — Other Ambulatory Visit (INDEPENDENT_AMBULATORY_CARE_PROVIDER_SITE_OTHER): Payer: PPO | Admitting: *Deleted

## 2015-08-20 DIAGNOSIS — E7800 Pure hypercholesterolemia, unspecified: Secondary | ICD-10-CM

## 2015-08-20 DIAGNOSIS — I119 Hypertensive heart disease without heart failure: Secondary | ICD-10-CM

## 2015-08-20 LAB — HEPATIC FUNCTION PANEL
ALK PHOS: 82 U/L (ref 33–130)
ALT: 15 U/L (ref 6–29)
AST: 20 U/L (ref 10–35)
Albumin: 3.9 g/dL (ref 3.6–5.1)
BILIRUBIN INDIRECT: 0.9 mg/dL (ref 0.2–1.2)
Bilirubin, Direct: 0.3 mg/dL — ABNORMAL HIGH (ref <=0.2)
TOTAL PROTEIN: 7.2 g/dL (ref 6.1–8.1)
Total Bilirubin: 1.2 mg/dL (ref 0.2–1.2)

## 2015-08-20 LAB — BASIC METABOLIC PANEL
BUN: 13 mg/dL (ref 7–25)
CALCIUM: 9.4 mg/dL (ref 8.6–10.4)
CO2: 30 mmol/L (ref 20–31)
CREATININE: 0.8 mg/dL (ref 0.60–0.93)
Chloride: 102 mmol/L (ref 98–110)
Glucose, Bld: 92 mg/dL (ref 65–99)
Potassium: 4.2 mmol/L (ref 3.5–5.3)
Sodium: 139 mmol/L (ref 135–146)

## 2015-08-20 LAB — LIPID PANEL
CHOLESTEROL: 149 mg/dL (ref 125–200)
HDL: 48 mg/dL (ref >=46)
LDL Cholesterol: 72 mg/dL (ref <130)
TRIGLYCERIDES: 145 mg/dL (ref <150)
Total CHOL/HDL Ratio: 3.1 Ratio (ref <=5.0)
VLDL: 29 mg/dL (ref <30)

## 2015-08-20 NOTE — Addendum Note (Signed)
Addended by: Eulis Foster on: 08/20/2015 12:22 PM   Modules accepted: Orders

## 2015-08-21 NOTE — Progress Notes (Signed)
Quick Note:  Please make copy of labs for patient visit. ______ 

## 2015-08-23 ENCOUNTER — Encounter: Payer: Self-pay | Admitting: Cardiology

## 2015-08-23 ENCOUNTER — Ambulatory Visit (INDEPENDENT_AMBULATORY_CARE_PROVIDER_SITE_OTHER): Payer: PPO | Admitting: Cardiology

## 2015-08-23 VITALS — BP 134/86 | HR 92 | Ht 67.0 in | Wt 217.4 lb

## 2015-08-23 DIAGNOSIS — E7800 Pure hypercholesterolemia, unspecified: Secondary | ICD-10-CM | POA: Diagnosis not present

## 2015-08-23 DIAGNOSIS — I119 Hypertensive heart disease without heart failure: Secondary | ICD-10-CM | POA: Diagnosis not present

## 2015-08-23 NOTE — Progress Notes (Signed)
Cardiology Office Note   Date:  08/23/2015   ID:  Laura Chambers, Laura Chambers 05/12/1941, MRN [id number]  PCP:  Warren Danes, MD  Cardiologist: Darlin Coco MD  Chief Complaint  Patient presents with  . routine follow up      History of Present Illness: AVINELL KERLEY is a 75 y.o. female who presents for a six-month follow-up visit. . She has a past history of essential hypertension and hypercholesterolemia. He does not have any history of ischemic heart disease. . She underwent successful back surgery in August 2012 by Dr. Annette Stable. In addition she has had both knees replaced and has had a right hip replacement. Dr. [name] is her orthopedist for her hip. The patient has a history of peripheral neuropathy which she relates to her knee surgery and her back surgery. 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 She is tired at the end of the day . She has not been having any chest pain or shortness of breath. Patient has a history of a hiatal hernia and is on Prilosec OTC. 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 Since last visit she has been doing well.  We reviewed her blood work today which is improved from the previous time She is under the care of neurologist Dr. Jaynee Eagles for occipital neuralgia and trigeminal neuralgia. She is on rosuvastatin 10 mg daily.  She is not having any myalgias from the rosuvastatin.   Past Medical History  Diagnosis Date  . HTN (hypertension)   . Hypercholesterolemia   . Obesity   . Hx of colonic polyps   . Renal angiomyolipoma   . Anxiety   . Hydronephrosis of right kidney   . Esophageal hernia   . Neuropathy due to medical condition ([place])     bilateral lower legs  . Arthritis   . Fibromyalgia   . Complication of anesthesia 2012    "irritated trachea" with cough x 1 year from intubation  . Osteoarthritis  (arthritis due to wear and tear of joints)     Past Surgical History  Procedure Laterality Date  . Total knee arthroplasty  2001    left  . Cholecystectomy  2010  . Tonsillectomy  1946  . Abdominal hysterectomy  1969  . Paraesophageal hernia repair  1989  . Breast biopsy  1989  . Nissen fundoplication    . Total knee arthroplasty  2010    right  . Blephoroplasty      bilateral  . Back surgery  2012    rod in back-  lumbar disc removal with bone graft  . Total hip arthroplasty Right 08/31/2012    Procedure: TOTAL HIP ARTHROPLASTY ANTERIOR APPROACH;  Surgeon: Mauri Pole, MD;  Location: [facility];  Service: Orthopedics;  Laterality: Right;     Current Outpatient Prescriptions  Medication Sig Dispense Refill  . aspirin 81 MG tablet Take 81 mg by mouth daily.    Marland Kitchen atenolol (TENORMIN) 25 MG tablet TAKE 1 TABLET BY MOUTH DAILY. 90 tablet 1  . baclofen (LIORESAL) 10 MG tablet Take 1 tablet (10 mg total) by mouth 3 (three) times daily. 90 each 12  . Calcium-Magnesium-Vitamin D [id number] MG-MG-UNIT TABS Take 1 tablet by mouth daily.     . folic acid (FOLVITE) Q000111Q MCG tablet Take 400 mcg by mouth daily.    Marland Kitchen gabapentin (NEURONTIN) 300 MG capsule Take  2 capsules (600 mg total) by mouth 3 (three) times daily. 180 capsule 11  . losartan-hydrochlorothiazide (HYZAAR) 50-12.5 MG per tablet TAKE 1 TABLET BY MOUTH ONCE DAILY 90 tablet 3  . Multiple Vitamin (MULTIVITAMIN) tablet Take 1 tablet by mouth every other day.     . promethazine (PHENERGAN) 25 MG tablet Take 25 mg by mouth every 8 (eight) hours as needed for nausea or vomiting.   0  . rosuvastatin (CRESTOR) 10 MG tablet TAKE 1 TABLET BY MOUTH DAILY. 90 tablet 1   No current facility-administered medications for this visit.    Allergies:   Epinephrine; Levaquin; Lipitor; and Trovan    Social History:  The patient  reports that she has never smoked. She has never used smokeless tobacco. She reports that she does not drink alcohol or  use illicit drugs.   Family History:  The patient's family history includes Colon cancer (age of onset: 43) in her sister; Diabetes in her brother, father, and sister; Heart disease in her father; Osteoarthritis in her father and mother; Pneumonia in her mother; Stroke in her father and mother.    ROS:  Please see the history of present illness.   Otherwise, review of systems are positive for none.   All other systems are reviewed and negative.    PHYSICAL EXAM: VS:  BP 134/86 mmHg  Pulse 92  Ht 5\' 7"  (1.702 m)  Wt 217 lb 6.4 oz (98.612 kg)  BMI 34.04 kg/m2 , BMI Body mass index is 34.04 kg/(m^2). GEN: Well nourished, well developed, in no acute distress HEENT: normal Neck: no JVD, carotid bruits, or masses Cardiac: RRR; no murmurs, rubs, or gallops,no edema  Respiratory:  clear to auscultation bilaterally, normal work of breathing GI: soft, nontender, nondistended, + BS MS: no deformity or atrophy Skin: warm and dry, no rash Neuro:  Strength and sensation are intact Psych: euthymic mood, full affect   EKG:  EKG is not ordered today.    Recent Labs: 08/20/2015: ALT 15; BUN 13; Creat 0.80; Potassium 4.2; Sodium 139    Lipid Panel    Component Value Date/Time   CHOL 149 08/20/2015 1222   TRIG 145 08/20/2015 1222   HDL 48 08/20/2015 1222   CHOLHDL 3.1 08/20/2015 1222   VLDL 29 08/20/2015 1222   LDLCALC 72 08/20/2015 1222      Wt Readings from Last 3 Encounters:  08/23/15 217 lb 6.4 oz (98.612 kg)  08/15/15 217 lb 12.8 oz (98.793 kg)  04/20/15 219 lb 9.6 oz (99.61 kg)        ASSESSMENT AND PLAN:  1. hypertensive heart disease without heart failure 2. Dyslipidemia 3. peripheral neuropathy 4. 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 osteoarthritis with previous bilateral knee replacement and right hip replacement.  Plan: Continue same medication.Her lab work is improved since last visit.   Continue with weight loss.   Current medicines are reviewed at length with the patient today.  The patient does not have concerns regarding medicines.  The following changes have been made:  no change  Labs/ tests ordered today include:  No orders of the defined types were placed in this encounter.     Disposition:  Following my retirement she will follow-up with Dr. Stanford Breed at [facility] in 6 months  Signed, Darlin Coco MD 08/23/2015 5:18 PM    [address], [place], [place]  [address] Phone: [phone]; Fax: [phone]    

## 2015-08-23 NOTE — Patient Instructions (Signed)
Medication Instructions:  Your physician recommends that you continue on your current medications as directed. Please refer to the Current Medication list given to you today.  Labwork: none  Testing/Procedures: none  Follow-Up: Your physician wants you to follow-up in: DR CRENSHAW AT [place] will receive a reminder letter in the mail two months in advance. If you don't receive a letter, please call our office to schedule the follow-up appointment.  If you need a refill on your cardiac medications before your next appointment, please call your pharmacy.

## 2015-09-06 MED FILL — ROSUVASTATIN CALCIUM 10 MG: 10 | 30 days supply | Qty: 30 | Fill #4

## 2015-09-06 MED FILL — ATENOLOL 25 MG TABLET: 25 | 90 days supply | Qty: 90 | Fill #1

## 2015-09-06 MED FILL — GABAPENTIN 300 MG CAPSULE: 300 | 30 days supply | Qty: 180 | Fill #1

## 2015-10-05 ENCOUNTER — Other Ambulatory Visit: Payer: Self-pay | Admitting: *Deleted

## 2015-10-05 MED ORDER — ROSUVASTATIN CALCIUM 10 MG PO TABS: 10.0000 mg | ORAL_TABLET | Freq: Every day | ORAL | Status: DC

## 2015-10-05 MED FILL — ROSUVASTATIN CALCIUM 10 MG: 10 | 30 days supply | Qty: 30 | Fill #5

## 2015-10-16 ENCOUNTER — Other Ambulatory Visit: Payer: Self-pay | Admitting: *Deleted

## 2015-10-16 MED ORDER — ATENOLOL 25 MG PO TABS: 25.0000 mg | ORAL_TABLET | Freq: Every day | ORAL | Status: DC

## 2015-10-16 NOTE — Telephone Encounter (Signed)
Patient called to get refill on medications and was unsure of refills being available at the pharmacy. I called the pharmacy to confirm which refills she needed.

## 2015-10-29 MED FILL — LOSARTAN-HCTZ 50-12.5 MG TA: 50-12.5 | 90 days supply | Qty: 90 | Fill #3

## 2015-11-05 MED FILL — ROSUVASTATIN CALCIUM 10 MG: 10 | 90 days supply | Qty: 90 | Fill #0

## 2015-12-06 MED FILL — ATENOLOL 25 MG TABLET: 25 | 90 days supply | Qty: 90 | Fill #0

## 2016-01-23 ENCOUNTER — Telehealth: Payer: Self-pay | Admitting: Cardiology

## 2016-01-23 MED ORDER — LOSARTAN POTASSIUM-HCTZ 50-12.5 MG PO TABS: 1.0000 | ORAL_TABLET | Freq: Every day | ORAL | Status: DC

## 2016-01-23 MED FILL — LOSARTAN-HCTZ 50-12.5 MG TA: 50-12.5 | 90 days supply | Qty: 90 | Fill #0

## 2016-01-23 NOTE — Telephone Encounter (Signed)
Prescription sent to pharmacy.  Patient made aware.

## 2016-01-23 NOTE — Telephone Encounter (Signed)
New message      *STAT* If patient is at the pharmacy, call can be transferred to refill team.   1. Which medications need to be refilled? (please list name of each medication and dose if known)  Losartan--HCTZ 50-12-5mg  2. Which pharmacy/location (including street and city if local pharmacy) is medication to be sent to? [facility] pharmacy on [address] 3. Do they need a 30 day or 90 day supply?  90 day------pt needs refill today because she is going out of town She is a former Dr Mare Ferrari pt seeing Dr Stanford Breed in [place]

## 2016-02-05 MED FILL — ROSUVASTATIN CALCIUM 10 MG: 10 | 90 days supply | Qty: 90 | Fill #1

## 2016-02-05 MED FILL — GABAPENTIN 300 MG CAPSULE: 300 | 30 days supply | Qty: 180 | Fill #2

## 2016-03-05 MED FILL — ATENOLOL 25 MG TABLET: 25 | 30 days supply | Qty: 30 | Fill #1

## 2016-03-18 ENCOUNTER — Encounter: Payer: Self-pay | Admitting: Cardiology

## 2016-03-30 NOTE — Progress Notes (Signed)
HPI: FU hypertension and hyperlipidemia; previously followed by Dr Mare Ferrari. Since last seen, patient denies dyspnea on exertion, orthopnea, PND, pedal edema, palpitations, syncope or chest pain.  Current Outpatient Prescriptions  Medication Sig Dispense Refill  . aspirin 81 MG tablet Take 81 mg by mouth daily.    Marland Kitchen atenolol (TENORMIN) 25 MG tablet Take 1 tablet (25 mg total) by mouth daily. 90 tablet 1  . baclofen (LIORESAL) 10 MG tablet Take 1 tablet (10 mg total) by mouth 3 (three) times daily. 90 each 12  . Calcium-Magnesium-Vitamin D [id number] MG-MG-UNIT TABS Take 1 tablet by mouth daily.     . folic acid (FOLVITE) Q000111Q MCG tablet Take 400 mcg by mouth daily.    Marland Kitchen gabapentin (NEURONTIN) 300 MG capsule Take 2 capsules (600 mg total) by mouth 3 (three) times daily. 180 capsule 11  . losartan-hydrochlorothiazide (HYZAAR) 50-12.5 MG tablet Take 1 tablet by mouth daily. 90 tablet 0  . Multiple Vitamin (MULTIVITAMIN) tablet Take 1 tablet by mouth every other day.     . promethazine (PHENERGAN) 25 MG tablet Take 25 mg by mouth every 8 (eight) hours as needed for nausea or vomiting.   0  . rosuvastatin (CRESTOR) 10 MG tablet Take 1 tablet (10 mg total) by mouth daily. 90 tablet 3   No current facility-administered medications for this visit.      Past Medical History:  Diagnosis Date  . Anxiety   . Arthritis   . Complication of anesthesia 2012   "irritated trachea" with cough x 1 year from intubation  . Esophageal hernia   . Fibromyalgia   . HTN (hypertension)   . Hx of colonic polyps   . Hydronephrosis of right kidney   . Hypercholesterolemia   . Neuropathy due to medical condition ([place])    bilateral lower legs  . Obesity   . 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 Renal angiomyolipoma     Past Surgical History:  Procedure Laterality Date  . ABDOMINAL HYSTERECTOMY  1969  . BACK SURGERY  2012   rod in back-  lumbar disc removal with bone graft    . blephoroplasty     bilateral  . BREAST BIOPSY  1989  . CHOLECYSTECTOMY  2010  . NISSEN FUNDOPLICATION    . PARAESOPHAGEAL HERNIA REPAIR  1989  . TONSILLECTOMY  1946  . TOTAL HIP ARTHROPLASTY Right 08/31/2012   Procedure: TOTAL HIP ARTHROPLASTY ANTERIOR APPROACH;  Surgeon: Mauri Pole, MD;  Location: [facility];  Service: Orthopedics;  Laterality: Right;  . TOTAL KNEE ARTHROPLASTY  2001   left  . TOTAL KNEE ARTHROPLASTY  2010   right    Social History   Social History  . Marital status: Divorced    Spouse name: N/A  . Number of children: 3  . Years of education: College   Occupational History  .  [employer]    [employer]. Minna Merritts   Social History Main Topics  . Smoking status: Never Smoker  . Smokeless tobacco: Never Used  . Alcohol use No  . Drug use: No  . Sexual activity: Not on file   Other Topics Concern  . Not on file   Social History Narrative   Patient lives at home alone.   Caffeine Use: 1 cup daily    Family History  Problem Relation Age of Onset  . Diabetes Father   . Osteoarthritis Father   . Heart disease Father   .  Stroke Father   . Osteoarthritis Mother   . Stroke Mother   . Pneumonia Mother   . Colon cancer Sister 21  . Colon polyps      neice/nephew  . Diabetes Sister   . Diabetes Brother     ROS: Some back pain and peripheral neuropathy but no fevers or chills, productive cough, hemoptysis, dysphasia, odynophagia, melena, hematochezia, dysuria, hematuria, rash, seizure activity, orthopnea, PND, pedal edema, claudication. Remaining systems are negative.  Physical Exam: Well-developed well-nourished in no acute distress.  Skin is warm and dry.  HEENT is normal.  Neck is supple.  Chest is clear to auscultation with normal expansion.  Cardiovascular exam is regular rate and rhythm.  Abdominal exam nontender or distended. No masses palpated. Extremities show no edema. neuro grossly intact  ECG-Sinus rhythm at a rate of 87. Cannot  rule out prior anterior infarct. Low voltage.  A/P  1 hypertension-blood pressure is mildly elevated today. However she follows this closely and it is typically control. Continue present medications. Check potassium and renal function. Approximately 20 min spent reviewing records prior to patient arrival.  2 hyperlipidemia-continue statin. Check lipids and liver.  3 peripheral neuropathy-I have asked her to establish with a primary care physician for this issue and other primary care needs.  Kirk Ruths, MD

## 2016-04-01 ENCOUNTER — Encounter: Payer: Self-pay | Admitting: Cardiology

## 2016-04-01 ENCOUNTER — Ambulatory Visit (INDEPENDENT_AMBULATORY_CARE_PROVIDER_SITE_OTHER): Payer: PPO | Admitting: Cardiology

## 2016-04-01 VITALS — BP 144/88 | HR 87 | Ht 67.0 in | Wt 214.0 lb

## 2016-04-01 DIAGNOSIS — E785 Hyperlipidemia, unspecified: Secondary | ICD-10-CM | POA: Diagnosis not present

## 2016-04-01 DIAGNOSIS — I10 Essential (primary) hypertension: Secondary | ICD-10-CM | POA: Diagnosis not present

## 2016-04-01 MED FILL — ATENOLOL 25 MG TABLET: 25 | 30 days supply | Qty: 30 | Fill #2

## 2016-04-01 NOTE — Patient Instructions (Signed)
Medication Instructions:   NO CHANGE  Labwork:  Your physician recommends that you return for lab work AT [place]:  Your physician wants you to follow-up in: [place] will receive a reminder letter in the mail two months in advance. If you don't receive a letter, please call our office to schedule the follow-up appointment.   If you need a refill on your cardiac medications before your next appointment, please call your pharmacy.

## 2016-04-10 ENCOUNTER — Telehealth: Payer: Self-pay | Admitting: Cardiology

## 2016-04-10 NOTE — Telephone Encounter (Signed)
Left message to call back  

## 2016-04-10 NOTE — Telephone Encounter (Signed)
New Message  Pt call requesting to speak with RN. Pt states she understands that Dr. Stanford Breed only specializes in Cardiology. And would like to speak with RN for a recommendation for a PCP. Please call back to discuss

## 2016-04-15 ENCOUNTER — Other Ambulatory Visit: Payer: Self-pay | Admitting: Cardiology

## 2016-04-15 ENCOUNTER — Other Ambulatory Visit: Payer: PPO | Admitting: *Deleted

## 2016-04-15 DIAGNOSIS — E785 Hyperlipidemia, unspecified: Secondary | ICD-10-CM | POA: Diagnosis not present

## 2016-04-15 LAB — BASIC METABOLIC PANEL
BUN: 12 mg/dL (ref 7–25)
CALCIUM: 9.4 mg/dL (ref 8.6–10.4)
CHLORIDE: 102 mmol/L (ref 98–110)
CO2: 28 mmol/L (ref 20–31)
CREATININE: 0.83 mg/dL (ref 0.60–0.93)
GLUCOSE: 94 mg/dL (ref 65–99)
Potassium: 3.9 mmol/L (ref 3.5–5.3)
Sodium: 139 mmol/L (ref 135–146)

## 2016-04-16 ENCOUNTER — Encounter: Payer: Self-pay | Admitting: *Deleted

## 2016-04-16 LAB — LIPID PANEL
CHOL/HDL RATIO: 3.2 ratio (ref <=5.0)
CHOLESTEROL: 136 mg/dL (ref 125–200)
HDL: 42 mg/dL — AB (ref >=46)
LDL Cholesterol: 57 mg/dL (ref <130)
Triglycerides: 184 mg/dL — ABNORMAL HIGH (ref <150)
VLDL: 37 mg/dL — ABNORMAL HIGH (ref <30)

## 2016-04-16 LAB — HEPATIC FUNCTION PANEL
ALBUMIN: 3.7 g/dL (ref 3.6–5.1)
ALK PHOS: 82 U/L (ref 33–130)
ALT: 11 U/L (ref 6–29)
AST: 19 U/L (ref 10–35)
BILIRUBIN DIRECT: 0.2 mg/dL (ref <=0.2)
BILIRUBIN TOTAL: 1.4 mg/dL — AB (ref 0.2–1.2)
Indirect Bilirubin: 1.2 mg/dL (ref 0.2–1.2)
Total Protein: 6.5 g/dL (ref 6.1–8.1)

## 2016-04-23 ENCOUNTER — Telehealth: Payer: Self-pay | Admitting: Cardiology

## 2016-04-23 NOTE — Telephone Encounter (Signed)
Pt is calling for lab results. Thanks

## 2016-04-23 NOTE — Telephone Encounter (Signed)
Returned call and left msg advising of normal results, that these were mailed, & if questions or clarification required, to call office.

## 2016-04-24 ENCOUNTER — Telehealth: Payer: Self-pay | Admitting: Cardiology

## 2016-04-24 MED ORDER — LOSARTAN POTASSIUM-HCTZ 50-12.5 MG PO TABS: 1.0000 | ORAL_TABLET | Freq: Every day | ORAL | 3 refills | Status: DC

## 2016-04-24 MED FILL — ATENOLOL 25 MG TABLET: 25 | 30 days supply | Qty: 30 | Fill #3

## 2016-04-24 MED FILL — ROSUVASTATIN CALCIUM 10 MG: 10 | 90 days supply | Qty: 90 | Fill #2

## 2016-04-24 MED FILL — LOSARTAN-HCTZ 50-12.5 MG TA: 50-12.5 | 90 days supply | Qty: 90 | Fill #0 | Status: TO

## 2016-04-24 NOTE — Telephone Encounter (Signed)
Refill sent to the pharmacy electronically.  

## 2016-04-24 NOTE — Telephone Encounter (Signed)
Pt said she was here 3 weeks and saw Dr Stanford Breed. He told her he was going to call in and update all her medicine.She needs her Losartan today,she is going out of town today. Please call to [place].

## 2016-05-07 NOTE — Telephone Encounter (Signed)
Spoke with patient and gave her [place] PCP as recommendation

## 2016-05-13 MED FILL — PROMETHAZINE 25 MG TABLET: 25 | 8 days supply | Qty: 30 | Fill #0

## 2016-05-23 ENCOUNTER — Other Ambulatory Visit: Payer: Self-pay | Admitting: *Deleted

## 2016-05-23 ENCOUNTER — Other Ambulatory Visit: Payer: Self-pay | Admitting: Neurology

## 2016-05-23 MED ORDER — GABAPENTIN 300 MG PO CAPS: 600.0000 mg | ORAL_CAPSULE | Freq: Three times a day (TID) | ORAL | 2 refills | Status: DC

## 2016-05-23 MED FILL — GABAPENTIN 300 MG CAPSULE: 300 | 30 days supply | Qty: 180 | Fill #0

## 2016-06-03 ENCOUNTER — Other Ambulatory Visit: Payer: Self-pay | Admitting: Cardiology

## 2016-06-03 MED FILL — ATENOLOL 25 MG TABLET: 25 | 30 days supply | Qty: 30 | Fill #0

## 2016-06-05 ENCOUNTER — Ambulatory Visit: Payer: PPO

## 2016-06-09 ENCOUNTER — Ambulatory Visit (INDEPENDENT_AMBULATORY_CARE_PROVIDER_SITE_OTHER): Payer: PPO | Admitting: Orthopaedic Surgery

## 2016-06-09 ENCOUNTER — Ambulatory Visit (INDEPENDENT_AMBULATORY_CARE_PROVIDER_SITE_OTHER): Payer: PPO

## 2016-06-09 DIAGNOSIS — G8929 Other chronic pain: Secondary | ICD-10-CM

## 2016-06-09 DIAGNOSIS — M25562 Pain in left knee: Secondary | ICD-10-CM

## 2016-06-09 DIAGNOSIS — Z96652 Presence of left artificial knee joint: Secondary | ICD-10-CM | POA: Diagnosis not present

## 2016-06-09 NOTE — Progress Notes (Signed)
Office Visit Note   Patient: Laura Chambers           Date of Birth: 01-17-1941           MRN: [id number] Visit Date: 06/09/2016              Requested by: Darlin Coco, MD No address on file PCP: Kirk Ruths, MD   Assessment & Plan: Visit Diagnoses:  1. Chronic pain of left knee   2. History of total knee replacement, left     Plan: At this point I would like to get a 3 phase bone scan to rule out prosthetic loosening of her left knee. Certainly this is not appear to be an infectious processes since is been going on for long period time. When I had her lay flat on the exam table lift her leg some redness dissipates but the left knee is still warm. She said this is been this way for a very long period of time. We will let her know what the bone scan shows in terms of that knee.  Follow-Up Instructions: Return if symptoms worsen or fail to improve.   Orders:  Orders Placed This Encounter  Procedures  . XR Knee 1-2 Views Left   No orders of the defined types were placed in this encounter.     Procedures: No procedures performed   Clinical Data: No additional findings.   Subjective: Chief Complaint  Patient presents with  . Left Knee - Pain    Patient had total replacement in 2001 on this knee by Dr. Cay Schillings. States it got infected and they had drain tube for about 6 months. She states she has always had problems with knee since then. "feverish, swollen"   The patient is very pleasant 75 year old is here for evaluation of her left knee. She had knee replacement in town by Dr. Gladstone Lighter at [facility] in 2001. She last saw one of his partners a joint specialist last year who said the joint looked okay. She said this really bother her the whole time since her surgery. He gets warm and red and she's had some warmth and redness in that leg or recently and a little bit more discomfort and is becoming more problematic. At times it is detrimentally affected  her quality of life and her mobility. She wanted to have someone else give her second opinion about this knee. She said it has been feeling better over the last few days to 2 weeks. HPI  Review of Systems He denies any headache, shortness of breath, chest pain, fever, chills, nausea, vomiting.  Objective: Vital Signs: There were no vitals taken for this visit.  Physical Exam She is alert and oriented 3 in no acute distress. Ortho Exam The left knee itself feels ligament was a stable and there is no significant effusion. There is warmth to the knee on palpation she's got excellent range of motion of it. There is slight redness you over the knee and the shin but it does dissipate when I had her lay flat lift her leg up. It still seems is to be uncomfortable to her though. Specialty Comments:  No specialty comments available.  Imaging: Xr Knee 1-2 Views Left  Result Date: 06/09/2016 2 views of her left knee AP and lateral show a total knee replacement that is been performed. There may be just some slight loosening medial mid really no gross deformities I can see. As of his fracture. There is no significant effusion  of the knee either.    PMFS History: Patient Active Problem List   Diagnosis Date Noted  . Trigeminal neuralgia 08/19/2015  . Musculoskeletal neck pain 08/19/2015  . Peripheral neuropathy ([place]) 01/12/2014  . Hyperglycemia 06/16/2012  . 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 Dyslipidemia 06/16/2012  . Globus sensation 02/04/2012  . Nausea 02/04/2012  . Bowel habit changes 02/04/2012  . Pyrosis 02/04/2012   Past Medical History:  Diagnosis Date  . Anxiety   . Arthritis   . Complication of anesthesia 2012   "irritated trachea" with cough x 1 year from intubation  . Esophageal hernia   . Fibromyalgia   . HTN (hypertension)   . Hx of colonic polyps   . Hydronephrosis of right kidney   . Hypercholesterolemia   . Neuropathy due to  medical condition ([place])    bilateral lower legs  . Obesity   . 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 Renal angiomyolipoma     Family History  Problem Relation Age of Onset  . Diabetes Father   . Osteoarthritis Father   . Heart disease Father   . Stroke Father   . Osteoarthritis Mother   . Stroke Mother   . Pneumonia Mother   . Colon cancer Sister 23  . Colon polyps      neice/nephew  . Diabetes Sister   . Diabetes Brother     Past Surgical History:  Procedure Laterality Date  . ABDOMINAL HYSTERECTOMY  1969  . BACK SURGERY  2012   rod in back-  lumbar disc removal with bone graft  . blephoroplasty     bilateral  . BREAST BIOPSY  1989  . CHOLECYSTECTOMY  2010  . NISSEN FUNDOPLICATION    . PARAESOPHAGEAL HERNIA REPAIR  1989  . TONSILLECTOMY  1946  . TOTAL HIP ARTHROPLASTY Right 08/31/2012   Procedure: TOTAL HIP ARTHROPLASTY ANTERIOR APPROACH;  Surgeon: Mauri Pole, MD;  Location: [facility];  Service: Orthopedics;  Laterality: Right;  . TOTAL KNEE ARTHROPLASTY  2001   left  . TOTAL KNEE ARTHROPLASTY  2010   right   Social History   Occupational History  .  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 Smoking status: Never Smoker  . Smokeless tobacco: Never Used  . Alcohol use No  . Drug use: No  . Sexual activity: Not on file

## 2016-06-10 ENCOUNTER — Other Ambulatory Visit (INDEPENDENT_AMBULATORY_CARE_PROVIDER_SITE_OTHER): Payer: Self-pay | Admitting: Orthopaedic Surgery

## 2016-06-10 DIAGNOSIS — Z96652 Presence of left artificial knee joint: Secondary | ICD-10-CM

## 2016-06-10 DIAGNOSIS — G8929 Other chronic pain: Secondary | ICD-10-CM

## 2016-06-10 DIAGNOSIS — M25562 Pain in left knee: Secondary | ICD-10-CM

## 2016-06-24 ENCOUNTER — Ambulatory Visit (HOSPITAL_COMMUNITY): Payer: PPO

## 2016-06-24 ENCOUNTER — Other Ambulatory Visit (HOSPITAL_COMMUNITY): Payer: PPO

## 2016-07-02 ENCOUNTER — Encounter: Payer: Self-pay | Admitting: Cardiology

## 2016-07-02 DIAGNOSIS — Z1231 Encounter for screening mammogram for malignant neoplasm of breast: Secondary | ICD-10-CM | POA: Diagnosis not present

## 2016-07-03 MED FILL — ATENOLOL 25 MG TABLET: 25 | 30 days supply | Qty: 30 | Fill #1

## 2016-07-09 ENCOUNTER — Encounter (HOSPITAL_COMMUNITY): Payer: PPO

## 2016-07-15 ENCOUNTER — Encounter (HOSPITAL_COMMUNITY)
Admission: RE | Admit: 2016-07-15 | Discharge: 2016-07-15 | Disposition: A | Discharge status: Home / Self Care | Payer: PPO | Source: Ambulatory Visit | Attending: Orthopaedic Surgery | Admitting: Orthopaedic Surgery

## 2016-07-15 DIAGNOSIS — G8929 Other chronic pain: Secondary | ICD-10-CM | POA: Diagnosis not present

## 2016-07-15 DIAGNOSIS — M25562 Pain in left knee: Secondary | ICD-10-CM | POA: Diagnosis not present

## 2016-07-15 DIAGNOSIS — Z96652 Presence of left artificial knee joint: Secondary | ICD-10-CM

## 2016-07-15 DIAGNOSIS — M7989 Other specified soft tissue disorders: Secondary | ICD-10-CM | POA: Diagnosis not present

## 2016-07-15 MED ORDER — TECHNETIUM TC 99M MEDRONATE IV KIT
25.0000 | PACK | Freq: Once | INTRAVENOUS | Status: AC | PRN
  Administered 2016-07-15: 25 via INTRAVENOUS

## 2016-07-30 MED FILL — GABAPENTIN 300 MG CAPSULE: 300 | 30 days supply | Qty: 180 | Fill #1

## 2016-07-30 MED FILL — ROSUVASTATIN CALCIUM 10 MG: 10 | 90 days supply | Qty: 90 | Fill #3

## 2016-07-30 MED FILL — ATENOLOL 25 MG TABLET: 25 | 30 days supply | Qty: 30 | Fill #2

## 2016-07-31 DIAGNOSIS — Z96653 Presence of artificial knee joint, bilateral: Secondary | ICD-10-CM | POA: Diagnosis not present

## 2016-07-31 DIAGNOSIS — Z471 Aftercare following joint replacement surgery: Secondary | ICD-10-CM | POA: Diagnosis not present

## 2016-08-01 MED FILL — AZITHROMYCIN 500 MG TABLET: 500 | 3 days supply | Qty: 3 | Fill #0

## 2016-09-02 MED FILL — ATENOLOL 25 MG TABLET: 25 | 30 days supply | Qty: 30 | Fill #3

## 2016-09-30 MED FILL — GABAPENTIN 300 MG CAPSULE: 300 | 30 days supply | Qty: 180 | Fill #2

## 2016-09-30 MED FILL — ATENOLOL 25 MG TABLET: 25 | 30 days supply | Qty: 30 | Fill #4

## 2016-11-03 ENCOUNTER — Telehealth: Payer: Self-pay | Admitting: Cardiology

## 2016-11-03 MED ORDER — ROSUVASTATIN CALCIUM 10 MG PO TABS: 10.0000 mg | ORAL_TABLET | Freq: Every day | ORAL | 1 refills | Status: DC

## 2016-11-03 MED FILL — ATENOLOL 25 MG TABLET: 25 | 30 days supply | Qty: 30 | Fill #5

## 2016-11-03 MED FILL — ROSUVASTATIN CALCIUM 10 MG: 10 | 90 days supply | Qty: 90 | Fill #0

## 2016-11-03 NOTE — Telephone Encounter (Signed)
Refill submitted to patient's preferred pharmacy.  

## 2016-11-03 NOTE — Telephone Encounter (Signed)
New message     *STAT* If patient is at the pharmacy, call can be transferred to refill team.   1. Which medications need to be refilled? (please list name of each medication and dose if known) Rosucastatin 10 mg  2. Which pharmacy/location (including street and city if local pharmacy) is medication to be sent to?[facility]  3. Do they need a 30 day or 90 day supply? 90 day

## 2016-11-10 ENCOUNTER — Encounter (INDEPENDENT_AMBULATORY_CARE_PROVIDER_SITE_OTHER): Payer: Self-pay

## 2016-11-10 ENCOUNTER — Encounter (INDEPENDENT_AMBULATORY_CARE_PROVIDER_SITE_OTHER): Payer: Self-pay | Admitting: Physician Assistant

## 2016-11-10 ENCOUNTER — Ambulatory Visit (INDEPENDENT_AMBULATORY_CARE_PROVIDER_SITE_OTHER): Payer: PPO | Admitting: Physician Assistant

## 2016-11-10 DIAGNOSIS — Z96642 Presence of left artificial hip joint: Secondary | ICD-10-CM

## 2016-11-10 DIAGNOSIS — M79605 Pain in left leg: Secondary | ICD-10-CM | POA: Diagnosis not present

## 2016-11-10 DIAGNOSIS — Z96652 Presence of left artificial knee joint: Secondary | ICD-10-CM | POA: Diagnosis not present

## 2016-11-10 DIAGNOSIS — M7989 Other specified soft tissue disorders: Secondary | ICD-10-CM | POA: Diagnosis not present

## 2016-11-10 MED ORDER — HYDROCODONE-ACETAMINOPHEN 5-325 MG PO TABS: 1.0000 | ORAL_TABLET | Freq: Four times a day (QID) | ORAL | 0 refills | Status: DC | PRN

## 2016-11-10 MED ORDER — DOXYCYCLINE HYCLATE 100 MG PO TABS: 100.0000 mg | ORAL_TABLET | Freq: Two times a day (BID) | ORAL | 0 refills | Status: DC

## 2016-11-10 MED FILL — DOXYCYCLINE HYCLATE 100 MG: 100 | 14 days supply | Qty: 28 | Fill #0

## 2016-11-10 MED FILL — HYDROCODON-APAP 5-325: 5-325 | 8 days supply | Qty: 30 | Fill #0

## 2016-11-10 NOTE — Progress Notes (Signed)
Office Visit Note   Patient: Laura Chambers           Date of Birth: Jun 28, 1941           MRN: [id number] Visit Date: 11/10/2016              Requested by: Lelon Perla, MD [id number] [place], [place] [address] PCP: Lelon Perla, MD   Assessment & Plan: Visit Diagnoses:  1. Presence of left artificial knee joint   2. Presence of left artificial hip joint   3. Pain in left leg   4. Left leg swelling     Plan: We will place her on doxycycline 100 mg twice daily for the next 7 days. We'll see her back in a week check. Progress lack of. Aspirate was sent for culture sensitivity and Gram stain and crystals.  Follow-Up Instructions: Return in about 1 week (around 11/17/2016).   Orders:  Orders Placed This Encounter  Procedures  . Gram stain  . Body fluid culture  . Cell count + diff,  w/ cryst-synvl fld   Meds ordered this encounter  Medications  . HYDROcodone-acetaminophen (NORCO/VICODIN) 5-325 MG tablet    Sig: Take 1 tablet by mouth every 6 (six) hours as needed for moderate pain.    Dispense:  30 tablet    Refill:  0  . doxycycline (VIBRA-TABS) 100 MG tablet    Sig: Take 1 tablet (100 mg total) by mouth 2 (two) times daily.    Dispense:  28 tablet    Refill:  0      Procedures: Large Joint Inj Date/Time: 11/10/2016 3:15 PM Performed by: Pete Pelt Authorized by: Pete Pelt   Consent Given by:  Patient Indications:  Pain Location:  Knee Site:  L knee Needle Size:  22 G Approach:  Superolateral Ultrasound Guidance: No   Fluoroscopic Guidance: No   Medications:  3 mL lidocaine 1 % Aspiration Attempted: No   Aspirate amount (mL):  10 Aspirate:  Cloudy Patient tolerance:  Patient tolerated the procedure well with no immediate complications     Clinical Data: No additional findings.   Subjective: Chief Complaint  Patient presents with  . Left Leg - Pain, Edema    HPI [name] returns today due to flare her  left leg which started weeks ago. She's having increased pain and leg especially over the last 3 nights. She states that she is having swelling of leg redness. She is having some chills. She had a 3-phase bone scan January 2018. Dr. Reynaldo Minium did not feel this represents loosening. There was some questionable loosening on of the femoral component per the radiologist read. She does report that she had a post op infection). After the 2001 left knee replacement had a drain that was indwelling for some time. She's had a total knee on the right side and is had no complications with this.   Review of Systems Positive for chills , left leg swelling and redness. No new injuries. Otherwise please see history of present illness  Objective: Vital Signs: There were no vitals taken for this visit.  Physical Exam  Constitutional: She appears well-developed and well-nourished. No distress.  Is able to slowly get on and off the exam table on her own. Alert and oriented 3  Ortho Exam Left knee and no abnormal warmth. Slight erythema over the anterior aspect of the knee. Erythema and distal lower leg that improves with elevation. Left calf supple  nontender. She has full extension and limited flexion due to pain. Specialty Comments:  No specialty comments available.  Imaging: No results found.   PMFS History: Patient Active Problem List   Diagnosis Date Noted  . Left leg swelling 11/10/2016  . Pain in left leg 11/10/2016  . Presence of left artificial knee joint 11/10/2016  . Trigeminal neuralgia 08/19/2015  . Musculoskeletal neck pain 08/19/2015  . Peripheral neuropathy 01/12/2014  . Hyperglycemia 06/16/2012  . 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 Dyslipidemia 06/16/2012  . Globus sensation 02/04/2012  . Nausea 02/04/2012  . Bowel habit changes 02/04/2012  . Pyrosis 02/04/2012   Past Medical History:  Diagnosis Date  . Anxiety   . Arthritis   . Complication of  anesthesia 2012   "irritated trachea" with cough x 1 year from intubation  . Esophageal hernia   . Fibromyalgia   . HTN (hypertension)   . Hx of colonic polyps   . Hydronephrosis of right kidney   . Hypercholesterolemia   . Neuropathy due to medical condition ([place])    bilateral lower legs  . Obesity   . 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 Renal angiomyolipoma     Family History  Problem Relation Age of Onset  . Diabetes Father   . Osteoarthritis Father   . Heart disease Father   . Stroke Father   . Osteoarthritis Mother   . Stroke Mother   . Pneumonia Mother   . Colon cancer Sister 40  . Colon polyps      neice/nephew  . Diabetes Sister   . Diabetes Brother     Past Surgical History:  Procedure Laterality Date  . ABDOMINAL HYSTERECTOMY  1969  . BACK SURGERY  2012   rod in back-  lumbar disc removal with bone graft  . blephoroplasty     bilateral  . BREAST BIOPSY  1989  . CHOLECYSTECTOMY  2010  . NISSEN FUNDOPLICATION    . PARAESOPHAGEAL HERNIA REPAIR  1989  . TONSILLECTOMY  1946  . TOTAL HIP ARTHROPLASTY Right 08/31/2012   Procedure: TOTAL HIP ARTHROPLASTY ANTERIOR APPROACH;  Surgeon: Mauri Pole, MD;  Location: [facility];  Service: Orthopedics;  Laterality: Right;  . TOTAL KNEE ARTHROPLASTY  2001   left  . TOTAL KNEE ARTHROPLASTY  2010   right   Social History   Occupational History  .  Golf    Dr. Minna Merritts   Social History Main Topics  . Smoking status: Never Smoker  . Smokeless tobacco: Never Used  . Alcohol use No  . Drug use: No  . Sexual activity: Not on file

## 2016-11-11 ENCOUNTER — Telehealth (INDEPENDENT_AMBULATORY_CARE_PROVIDER_SITE_OTHER): Payer: Self-pay | Admitting: Radiology

## 2016-11-11 ENCOUNTER — Encounter (HOSPITAL_COMMUNITY): Payer: Self-pay

## 2016-11-11 ENCOUNTER — Inpatient Hospital Stay (HOSPITAL_COMMUNITY)
Admission: EM | Admit: 2016-11-11 | Discharge: 2016-11-19 | DRG: 463 | Disposition: A | Discharge status: Skilled Nursing Facility | Payer: PPO | Attending: Orthopaedic Surgery | Admitting: Orthopaedic Surgery

## 2016-11-11 ENCOUNTER — Telehealth (INDEPENDENT_AMBULATORY_CARE_PROVIDER_SITE_OTHER): Payer: Self-pay | Admitting: Physician Assistant

## 2016-11-11 DIAGNOSIS — Y92230 Patient room in hospital as the place of occurrence of the external cause: Secondary | ICD-10-CM | POA: Diagnosis not present

## 2016-11-11 DIAGNOSIS — T84033A Mechanical loosening of internal left knee prosthetic joint, initial encounter: Secondary | ICD-10-CM | POA: Diagnosis present

## 2016-11-11 DIAGNOSIS — E7800 Pure hypercholesterolemia, unspecified: Secondary | ICD-10-CM | POA: Diagnosis not present

## 2016-11-11 DIAGNOSIS — R339 Retention of urine, unspecified: Secondary | ICD-10-CM | POA: Diagnosis not present

## 2016-11-11 DIAGNOSIS — Z833 Family history of diabetes mellitus: Secondary | ICD-10-CM

## 2016-11-11 DIAGNOSIS — Z96641 Presence of right artificial hip joint: Secondary | ICD-10-CM | POA: Diagnosis not present

## 2016-11-11 DIAGNOSIS — Z8249 Family history of ischemic heart disease and other diseases of the circulatory system: Secondary | ICD-10-CM | POA: Diagnosis not present

## 2016-11-11 DIAGNOSIS — L0390 Cellulitis, unspecified: Secondary | ICD-10-CM

## 2016-11-11 DIAGNOSIS — Z881 Allergy status to other antibiotic agents status: Secondary | ICD-10-CM

## 2016-11-11 DIAGNOSIS — T8454XA Infection and inflammatory reaction due to internal left knee prosthesis, initial encounter: Principal | ICD-10-CM | POA: Diagnosis present

## 2016-11-11 DIAGNOSIS — Z800 Family history of malignant neoplasm of digestive organs: Secondary | ICD-10-CM

## 2016-11-11 DIAGNOSIS — Z96651 Presence of right artificial knee joint: Secondary | ICD-10-CM | POA: Diagnosis not present

## 2016-11-11 DIAGNOSIS — N170 Acute kidney failure with tubular necrosis: Secondary | ICD-10-CM | POA: Diagnosis not present

## 2016-11-11 DIAGNOSIS — Z823 Family history of stroke: Secondary | ICD-10-CM | POA: Diagnosis not present

## 2016-11-11 DIAGNOSIS — N141 Nephropathy induced by other drugs, medicaments and biological substances: Secondary | ICD-10-CM | POA: Diagnosis not present

## 2016-11-11 DIAGNOSIS — Z9109 Other allergy status, other than to drugs and biological substances: Secondary | ICD-10-CM | POA: Diagnosis not present

## 2016-11-11 DIAGNOSIS — Y831 Surgical operation with implant of artificial internal device as the cause of abnormal reaction of the patient, or of later complication, without mention of misadventure at the time of the procedure: Secondary | ICD-10-CM | POA: Diagnosis not present

## 2016-11-11 DIAGNOSIS — M797 Fibromyalgia: Secondary | ICD-10-CM | POA: Diagnosis present

## 2016-11-11 DIAGNOSIS — I10 Essential (primary) hypertension: Secondary | ICD-10-CM | POA: Diagnosis not present

## 2016-11-11 DIAGNOSIS — Z7982 Long term (current) use of aspirin: Secondary | ICD-10-CM | POA: Diagnosis not present

## 2016-11-11 DIAGNOSIS — M25562 Pain in left knee: Secondary | ICD-10-CM | POA: Diagnosis present

## 2016-11-11 DIAGNOSIS — Z79899 Other long term (current) drug therapy: Secondary | ICD-10-CM | POA: Diagnosis not present

## 2016-11-11 DIAGNOSIS — T368X5A Adverse effect of other systemic antibiotics, initial encounter: Secondary | ICD-10-CM | POA: Diagnosis not present

## 2016-11-11 DIAGNOSIS — R0602 Shortness of breath: Secondary | ICD-10-CM

## 2016-11-11 DIAGNOSIS — L03116 Cellulitis of left lower limb: Secondary | ICD-10-CM

## 2016-11-11 DIAGNOSIS — M21372 Foot drop, left foot: Secondary | ICD-10-CM | POA: Diagnosis present

## 2016-11-11 LAB — SYNOVIAL CELL COUNT + DIFF, W/ CRYSTALS
BASOPHILS, %: 0 %
Eosinophils-Synovial: 0 % (ref 0–2)
LYMPHOCYTES-SYNOVIAL FLD: 6 % (ref 0–74)
Monocyte/Macrophage: 7 % (ref 0–69)
NEUTROPHIL, SYNOVIAL: 87 % — AB (ref 0–24)
Synoviocytes, %: 0 % (ref 0–15)
WBC, Synovial: 28750 cells/uL — ABNORMAL HIGH (ref <150)

## 2016-11-11 LAB — CBC WITH DIFFERENTIAL/PLATELET
BASOS ABS: 0 10*3/uL (ref 0.0–0.1)
Basophils Relative: 0 %
Eosinophils Absolute: 0.1 10*3/uL (ref 0.0–0.7)
Eosinophils Relative: 1 %
HCT: 38.1 % (ref 36.0–46.0)
Hemoglobin: 12.9 g/dL (ref 12.0–15.0)
LYMPHS PCT: 14 %
Lymphs Abs: 2 10*3/uL (ref 0.7–4.0)
MCH: 29.9 pg (ref 26.0–34.0)
MCHC: 33.9 g/dL (ref 30.0–36.0)
MCV: 88.2 fL (ref 78.0–100.0)
MONO ABS: 1.3 10*3/uL — AB (ref 0.1–1.0)
Monocytes Relative: 9 %
Neutro Abs: 11.3 10*3/uL — ABNORMAL HIGH (ref 1.7–7.7)
Neutrophils Relative %: 76 %
Platelets: 376 10*3/uL (ref 150–400)
RBC: 4.32 MIL/uL (ref 3.87–5.11)
RDW: 13.7 % (ref 11.5–15.5)
WBC: 14.8 10*3/uL — AB (ref 4.0–10.5)

## 2016-11-11 LAB — BASIC METABOLIC PANEL
ANION GAP: 8 (ref 5–15)
BUN: 19 mg/dL (ref 6–20)
CALCIUM: 9.2 mg/dL (ref 8.9–10.3)
CO2: 31 mmol/L (ref 22–32)
Chloride: 97 mmol/L — ABNORMAL LOW (ref 101–111)
Creatinine, Ser: 1.01 mg/dL — ABNORMAL HIGH (ref 0.44–1.00)
GFR calc Af Amer: >60 mL/min (ref >60)
GFR, EST NON AFRICAN AMERICAN: 53 mL/min — AB (ref >60)
GLUCOSE: 121 mg/dL — AB (ref 65–99)
Potassium: 4.2 mmol/L (ref 3.5–5.1)
SODIUM: 136 mmol/L (ref 135–145)

## 2016-11-11 MED ORDER — ATENOLOL 25 MG PO TABS
25.0000 mg | ORAL_TABLET | Freq: Every day | ORAL | Status: DC
  Administered 2016-11-12 – 2016-11-18 (×4): 25 mg via ORAL
  Filled 2016-11-11 (×8): qty 1

## 2016-11-11 MED ORDER — OXYCODONE-ACETAMINOPHEN 5-325 MG PO TABS
1.0000 | ORAL_TABLET | Freq: Once | ORAL | Status: AC
  Administered 2016-11-11: 1 via ORAL
  Filled 2016-11-11: qty 1

## 2016-11-11 MED ORDER — LOSARTAN POTASSIUM-HCTZ 50-12.5 MG PO TABS: 1.0000 | ORAL_TABLET | Freq: Every day | ORAL | Status: DC

## 2016-11-11 MED ORDER — ROSUVASTATIN CALCIUM 10 MG PO TABS
10.0000 mg | ORAL_TABLET | Freq: Every day | ORAL | Status: DC
Start: 2016-11-12 — End: 2016-11-19
  Administered 2016-11-12 – 2016-11-19 (×8): 10 mg via ORAL
  Filled 2016-11-11 (×8): qty 1

## 2016-11-11 MED ORDER — PROMETHAZINE HCL 25 MG PO TABS
25.0000 mg | ORAL_TABLET | Freq: Three times a day (TID) | ORAL | Status: DC | PRN
  Administered 2016-11-12: 25 mg via ORAL
  Filled 2016-11-11 (×2): qty 1

## 2016-11-11 MED ORDER — ASPIRIN EC 81 MG PO TBEC
81.0000 mg | DELAYED_RELEASE_TABLET | Freq: Every day | ORAL | Status: DC
  Administered 2016-11-12 – 2016-11-13 (×2): 81 mg via ORAL
  Filled 2016-11-11 (×3): qty 1

## 2016-11-11 MED ORDER — OXYCODONE-ACETAMINOPHEN 5-325 MG PO TABS
1.0000 | ORAL_TABLET | Freq: Four times a day (QID) | ORAL | Status: DC | PRN
  Administered 2016-11-12: 04:00:00 1 via ORAL
  Filled 2016-11-11 (×2): qty 1

## 2016-11-11 MED ORDER — PROMETHAZINE HCL 25 MG PO TABS
25.0000 mg | ORAL_TABLET | Freq: Once | ORAL | Status: AC
  Administered 2016-11-11: 25 mg via ORAL
  Filled 2016-11-11: qty 1

## 2016-11-11 MED ORDER — DOXYCYCLINE HYCLATE 100 MG PO TABS
100.0000 mg | ORAL_TABLET | Freq: Two times a day (BID) | ORAL | Status: DC
  Filled 2016-11-11 (×2): qty 1

## 2016-11-11 MED ORDER — GABAPENTIN 300 MG PO CAPS
600.0000 mg | ORAL_CAPSULE | Freq: Three times a day (TID) | ORAL | Status: DC
  Administered 2016-11-11 – 2016-11-19 (×22): 600 mg via ORAL
  Filled 2016-11-11 (×23): qty 2

## 2016-11-11 MED ORDER — LIDOCAINE HCL 1 % IJ SOLN
3.0000 mL | INTRAMUSCULAR | Status: AC | PRN
  Administered 2016-11-10: 3 mL

## 2016-11-11 NOTE — ED Triage Notes (Signed)
Patient c/o left knee and left leg swelling x 5 days. Patient had fluid drawn from left knee yesterday. Today patient has increased pain and swelling to the left lower leg. Patient was placed on doxycycline and hydrocodone. Patient states she has been taking the pain meds 2 hours apart with no releif

## 2016-11-11 NOTE — Telephone Encounter (Signed)
See other message, I have already s/w patient and advised go to ED.  Dr Ninfa Linden is aware.

## 2016-11-11 NOTE — Telephone Encounter (Signed)
PT REQUESTED A CALL BACK REGARDING HER EXTREME PAIN SHE IS IN, WANTED ADVICE AS TO WHETHER TO GO TO THE ER OR NOT. SENT TO TRIAGE AS WELL.  Marina Gravel [phone]

## 2016-11-11 NOTE — ED Notes (Signed)
Pt leg wrapped in warm blanket.

## 2016-11-11 NOTE — Telephone Encounter (Signed)
Patient called and said that today her pain is so much worse, leg and foot are even more swollen.  She has numbness in the leg and has what she describes as "drop foot".  She is taking the hydrocodone with no relief.  I advised her the white count is elevated from the aspiration yesterday.  She is going to go to [facility] ED for further workup.  I called Dr Ninfa Linden and advised.

## 2016-11-11 NOTE — ED Provider Notes (Signed)
[facility] DEPT Provider Note   CSN: [id number] Arrival date & time: 11/11/16  1632     History   Chief Complaint Chief Complaint  Patient presents with  . Leg Pain    HPI Laura Chambers is a 76 y.o. female.  The history is provided by the patient.  Leg Pain   This is a recurrent problem. Episode onset: 2 weeks. The problem occurs constantly. The problem has been gradually worsening. The pain is present in the left knee and left lower leg. The pain is moderate. Associated symptoms include limited range of motion and stiffness. The symptoms are aggravated by activity and contact. Treatments tried: norco. The treatment provided no relief. There has been no history of extremity trauma.   s/p knee replacement in 2001 and has has intermittent problems with the leg. Saw Ortho yesterday and got arthrocentesis which showed crystals and  Increased WBC.   Past Medical History:  Diagnosis Date  . Anxiety   . Arthritis   . Complication of anesthesia 2012   "irritated trachea" with cough x 1 year from intubation  . Esophageal hernia   . Fibromyalgia   . HTN (hypertension)   . Hx of colonic polyps   . Hydronephrosis of right kidney   . Hypercholesterolemia   . Neuropathy due to medical condition ([place])    bilateral lower legs  . Obesity   . 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 Renal angiomyolipoma     Patient Active Problem List   Diagnosis Date Noted  . Knee pain, left 11/11/2016  . Left leg swelling 11/10/2016  . Pain in left leg 11/10/2016  . Presence of left artificial knee joint 11/10/2016  . Trigeminal neuralgia 08/19/2015  . Musculoskeletal neck pain 08/19/2015  . Peripheral neuropathy 01/12/2014  . Hyperglycemia 06/16/2012  . 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 Dyslipidemia 06/16/2012  . Globus sensation 02/04/2012  . Nausea 02/04/2012  . Bowel habit changes 02/04/2012  . Pyrosis 02/04/2012    Past  Surgical History:  Procedure Laterality Date  . ABDOMINAL HYSTERECTOMY  1969  . BACK SURGERY  2012   rod in back-  lumbar disc removal with bone graft  . blephoroplasty     bilateral  . BREAST BIOPSY  1989  . CHOLECYSTECTOMY  2010  . NISSEN FUNDOPLICATION    . PARAESOPHAGEAL HERNIA REPAIR  1989  . TONSILLECTOMY  1946  . TOTAL HIP ARTHROPLASTY Right 08/31/2012   Procedure: TOTAL HIP ARTHROPLASTY ANTERIOR APPROACH;  Surgeon: Mauri Pole, MD;  Location: [facility];  Service: Orthopedics;  Laterality: Right;  . TOTAL KNEE ARTHROPLASTY  2001   left  . TOTAL KNEE ARTHROPLASTY  2010   right    OB History    No data available       Home Medications    Prior to Admission medications   Medication Sig Start Date End Date Taking? Authorizing Provider  aspirin EC 81 MG tablet Take 81 mg by mouth daily.   Yes [provider]  atenolol (TENORMIN) 25 MG tablet TAKE 1 TABLET BY MOUTH DAILY. 06/03/16  Yes Lelon Perla, MD  Calcium Carbonate-Vitamin D (CALCIUM 600+D) 600-400 MG-UNIT tablet Take 1 tablet by mouth every other day.   Yes [provider]  doxycycline (VIBRA-TABS) 100 MG tablet Take 1 tablet (100 mg total) by mouth 2 (two) times daily. 11/10/16 11/24/16 Yes Pete Pelt, PA-C  folic acid (FOLVITE) 400 MCG tablet  Take 400 mcg by mouth daily.   Yes [provider]  gabapentin (NEURONTIN) 300 MG capsule Take 2 capsules (600 mg total) by mouth 3 (three) times daily. 05/23/16  Yes Melvenia Beam, MD  HYDROcodone-acetaminophen (NORCO/VICODIN) 5-325 MG tablet Take 1 tablet by mouth every 6 (six) hours as needed for moderate pain. 11/10/16  Yes Pete Pelt, PA-C  losartan-hydrochlorothiazide (HYZAAR) 50-12.5 MG tablet Take 1 tablet by mouth daily. 04/24/16  Yes Lelon Perla, MD  Multiple Vitamin (MULTIVITAMIN WITH MINERALS) TABS tablet Take 1 tablet by mouth every other day.   Yes [provider]  promethazine (PHENERGAN) 25 MG tablet Take 25  mg by mouth every 8 (eight) hours as needed for nausea or vomiting.    Yes [provider]  rosuvastatin (CRESTOR) 10 MG tablet Take 1 tablet (10 mg total) by mouth daily. 11/03/16  Yes Lelon Perla, MD    Family History Family History  Problem Relation Age of Onset  . Diabetes Father   . Osteoarthritis Father   . Heart disease Father   . Stroke Father   . Osteoarthritis Mother   . Stroke Mother   . Pneumonia Mother   . Colon cancer Sister 70  . Colon polyps      neice/nephew  . Diabetes Sister   . Diabetes Brother     Social History Social History  Substance Use Topics  . Smoking status: Never Smoker  . Smokeless tobacco: Never Used  . Alcohol use No     Allergies   Epinephrine; Levofloxacin; Lipitor [atorvastatin]; and Trovan [alatrofloxacin]   Review of Systems Review of Systems  Musculoskeletal: Positive for stiffness.  All other systems are reviewed and are negative for acute change except as noted in the HPI    Physical Exam Updated Vital Signs BP 130/78 (BP Location: Right Arm)   Pulse 79   Temp 98.3 F (36.8 C) (Oral)   Resp 19   Ht 5\' 6"  (1.676 m)   Wt 198 lb (89.8 kg)   SpO2 100%   BMI 31.96 kg/m   Physical Exam  Constitutional: She is oriented to person, place, and time. She appears well-developed and well-nourished. No distress.  HENT:  Head: Normocephalic and atraumatic.  Right Ear: External ear normal.  Left Ear: External ear normal.  Nose: Nose normal.  Eyes: Conjunctivae and EOM are normal. No scleral icterus.  Neck: Normal range of motion and phonation normal.  Cardiovascular: Normal rate and regular rhythm.   Pulmonary/Chest: Effort normal. No stridor. No respiratory distress.  Abdominal: She exhibits no distension.  Musculoskeletal: She exhibits no edema.       Left knee: She exhibits swelling and erythema (mild; in the anterior aspect of leg). Tenderness found.  Neurological: She is alert and oriented to person,  place, and time.  Skin: She is not diaphoretic.  Psychiatric: She has a normal mood and affect. Her behavior is normal.  Vitals reviewed.    ED Treatments / Results  Labs (all labs ordered are listed, but only abnormal results are displayed) Labs Reviewed  CBC WITH DIFFERENTIAL/PLATELET - Abnormal; Notable for the following:       Result Value   WBC 14.8 (*)    Neutro Abs 11.3 (*)    Monocytes Absolute 1.3 (*)    All other components within normal limits  BASIC METABOLIC PANEL - Abnormal; Notable for the following:    Chloride 97 (*)    Glucose, Bld 121 (*)  Creatinine, Ser 1.01 (*)    GFR calc non Af Amer 53 (*)    All other components within normal limits    EKG  EKG Interpretation None       Radiology No results found.  Procedures Procedures (including critical care time)  Medications Ordered in ED Medications  oxyCODONE-acetaminophen (PERCOCET/ROXICET) 5-325 MG per tablet 1 tablet (not administered)  aspirin EC tablet 81 mg (not administered)  atenolol (TENORMIN) tablet 25 mg (not administered)  doxycycline (VIBRA-TABS) tablet 100 mg (not administered)  gabapentin (NEURONTIN) capsule 600 mg (not administered)  promethazine (PHENERGAN) tablet 25 mg (not administered)  rosuvastatin (CRESTOR) tablet 10 mg (not administered)  losartan-hydrochlorothiazide (HYZAAR) 50-12.5 MG per tablet 1 tablet (not administered)  oxyCODONE-acetaminophen (PERCOCET/ROXICET) 5-325 MG per tablet 1 tablet (1 tablet Oral Given 11/11/16 2024)  promethazine (PHENERGAN) tablet 25 mg (25 mg Oral Given 11/11/16 2024)     Initial Impression / Assessment and Plan / ED Course  I have reviewed the triage vital signs and the nursing notes.  Pertinent labs & imaging results that were available during my care of the patient were reviewed by me and considered in my medical decision making (see chart for details).     Left knee pain evaluated by [facility] orthopedic Surgery yesterday with  arthrocentesis which showed positive crystals with elevated white blood cell count. Patient with leukocytosis on labs today. Discussed with orthopedic surgery for possible concern for septic arthritis. They felt that this was unlikely however will admit the patient and reassess in the morning.  Final Clinical Impressions(s) / ED Diagnoses   Final diagnoses:  Acute pain of left knee      Mei Suits, Grayce Sessions, MD 11/11/16 2251

## 2016-11-12 ENCOUNTER — Inpatient Hospital Stay (HOSPITAL_COMMUNITY): Payer: PPO

## 2016-11-12 DIAGNOSIS — G9009 Other idiopathic peripheral autonomic neuropathy: Secondary | ICD-10-CM | POA: Diagnosis not present

## 2016-11-12 DIAGNOSIS — M79609 Pain in unspecified limb: Secondary | ICD-10-CM

## 2016-11-12 DIAGNOSIS — R262 Difficulty in walking, not elsewhere classified: Secondary | ICD-10-CM | POA: Diagnosis not present

## 2016-11-12 DIAGNOSIS — E7800 Pure hypercholesterolemia, unspecified: Secondary | ICD-10-CM | POA: Diagnosis not present

## 2016-11-12 DIAGNOSIS — M6588 Other synovitis and tenosynovitis, other site: Secondary | ICD-10-CM | POA: Diagnosis not present

## 2016-11-12 DIAGNOSIS — E785 Hyperlipidemia, unspecified: Secondary | ICD-10-CM | POA: Diagnosis not present

## 2016-11-12 DIAGNOSIS — R739 Hyperglycemia, unspecified: Secondary | ICD-10-CM | POA: Diagnosis not present

## 2016-11-12 DIAGNOSIS — Z8249 Family history of ischemic heart disease and other diseases of the circulatory system: Secondary | ICD-10-CM | POA: Diagnosis not present

## 2016-11-12 DIAGNOSIS — R0602 Shortness of breath: Secondary | ICD-10-CM | POA: Diagnosis not present

## 2016-11-12 DIAGNOSIS — F411 Generalized anxiety disorder: Secondary | ICD-10-CM | POA: Diagnosis not present

## 2016-11-12 DIAGNOSIS — M21372 Foot drop, left foot: Secondary | ICD-10-CM | POA: Diagnosis not present

## 2016-11-12 DIAGNOSIS — B999 Unspecified infectious disease: Secondary | ICD-10-CM | POA: Diagnosis not present

## 2016-11-12 DIAGNOSIS — I119 Hypertensive heart disease without heart failure: Secondary | ICD-10-CM | POA: Diagnosis not present

## 2016-11-12 DIAGNOSIS — N170 Acute kidney failure with tubular necrosis: Secondary | ICD-10-CM | POA: Diagnosis not present

## 2016-11-12 DIAGNOSIS — Z800 Family history of malignant neoplasm of digestive organs: Secondary | ICD-10-CM | POA: Diagnosis not present

## 2016-11-12 DIAGNOSIS — M542 Cervicalgia: Secondary | ICD-10-CM | POA: Diagnosis not present

## 2016-11-12 DIAGNOSIS — Z881 Allergy status to other antibiotic agents status: Secondary | ICD-10-CM | POA: Diagnosis not present

## 2016-11-12 DIAGNOSIS — M6281 Muscle weakness (generalized): Secondary | ICD-10-CM | POA: Diagnosis not present

## 2016-11-12 DIAGNOSIS — G500 Trigeminal neuralgia: Secondary | ICD-10-CM | POA: Diagnosis not present

## 2016-11-12 DIAGNOSIS — Y92230 Patient room in hospital as the place of occurrence of the external cause: Secondary | ICD-10-CM | POA: Diagnosis not present

## 2016-11-12 DIAGNOSIS — Z4733 Aftercare following explantation of knee joint prosthesis: Secondary | ICD-10-CM | POA: Diagnosis not present

## 2016-11-12 DIAGNOSIS — S8991XA Unspecified injury of right lower leg, initial encounter: Secondary | ICD-10-CM | POA: Diagnosis not present

## 2016-11-12 DIAGNOSIS — T368X5A Adverse effect of other systemic antibiotics, initial encounter: Secondary | ICD-10-CM | POA: Diagnosis not present

## 2016-11-12 DIAGNOSIS — Z79899 Other long term (current) drug therapy: Secondary | ICD-10-CM | POA: Diagnosis not present

## 2016-11-12 DIAGNOSIS — L03116 Cellulitis of left lower limb: Secondary | ICD-10-CM

## 2016-11-12 DIAGNOSIS — M797 Fibromyalgia: Secondary | ICD-10-CM | POA: Diagnosis present

## 2016-11-12 DIAGNOSIS — N141 Nephropathy induced by other drugs, medicaments and biological substances: Secondary | ICD-10-CM | POA: Diagnosis not present

## 2016-11-12 DIAGNOSIS — R279 Unspecified lack of coordination: Secondary | ICD-10-CM | POA: Diagnosis not present

## 2016-11-12 DIAGNOSIS — M7989 Other specified soft tissue disorders: Secondary | ICD-10-CM

## 2016-11-12 DIAGNOSIS — R339 Retention of urine, unspecified: Secondary | ICD-10-CM | POA: Diagnosis not present

## 2016-11-12 DIAGNOSIS — T8454XA Infection and inflammatory reaction due to internal left knee prosthesis, initial encounter: Secondary | ICD-10-CM | POA: Diagnosis not present

## 2016-11-12 DIAGNOSIS — Y831 Surgical operation with implant of artificial internal device as the cause of abnormal reaction of the patient, or of later complication, without mention of misadventure at the time of the procedure: Secondary | ICD-10-CM | POA: Diagnosis present

## 2016-11-12 DIAGNOSIS — R110 Nausea: Secondary | ICD-10-CM | POA: Diagnosis not present

## 2016-11-12 DIAGNOSIS — Z9109 Other allergy status, other than to drugs and biological substances: Secondary | ICD-10-CM | POA: Diagnosis not present

## 2016-11-12 DIAGNOSIS — Z833 Family history of diabetes mellitus: Secondary | ICD-10-CM | POA: Diagnosis not present

## 2016-11-12 DIAGNOSIS — Z96641 Presence of right artificial hip joint: Secondary | ICD-10-CM | POA: Diagnosis present

## 2016-11-12 DIAGNOSIS — I10 Essential (primary) hypertension: Secondary | ICD-10-CM | POA: Diagnosis not present

## 2016-11-12 DIAGNOSIS — R12 Heartburn: Secondary | ICD-10-CM | POA: Diagnosis not present

## 2016-11-12 DIAGNOSIS — N1330 Unspecified hydronephrosis: Secondary | ICD-10-CM | POA: Diagnosis not present

## 2016-11-12 DIAGNOSIS — Z823 Family history of stroke: Secondary | ICD-10-CM | POA: Diagnosis not present

## 2016-11-12 DIAGNOSIS — Z96651 Presence of right artificial knee joint: Secondary | ICD-10-CM | POA: Diagnosis present

## 2016-11-12 DIAGNOSIS — M1990 Unspecified osteoarthritis, unspecified site: Secondary | ICD-10-CM | POA: Diagnosis not present

## 2016-11-12 DIAGNOSIS — Z7982 Long term (current) use of aspirin: Secondary | ICD-10-CM | POA: Diagnosis not present

## 2016-11-12 DIAGNOSIS — T84033A Mechanical loosening of internal left knee prosthetic joint, initial encounter: Secondary | ICD-10-CM | POA: Diagnosis not present

## 2016-11-12 DIAGNOSIS — F458 Other somatoform disorders: Secondary | ICD-10-CM | POA: Diagnosis not present

## 2016-11-12 DIAGNOSIS — M25562 Pain in left knee: Secondary | ICD-10-CM | POA: Diagnosis not present

## 2016-11-12 LAB — SEDIMENTATION RATE: Sed Rate: 73 mm/hr — ABNORMAL HIGH (ref 0–22)

## 2016-11-12 LAB — GLUCOSE, CAPILLARY
GLUCOSE-CAPILLARY: 116 mg/dL — AB (ref 65–99)
Glucose-Capillary: 107 mg/dL — ABNORMAL HIGH (ref 65–99)
Glucose-Capillary: 75 mg/dL (ref 65–99)
Glucose-Capillary: 92 mg/dL (ref 65–99)

## 2016-11-12 LAB — CBC
HCT: 38.1 % (ref 36.0–46.0)
HEMOGLOBIN: 12.5 g/dL (ref 12.0–15.0)
MCH: 28.8 pg (ref 26.0–34.0)
MCHC: 32.8 g/dL (ref 30.0–36.0)
MCV: 87.8 fL (ref 78.0–100.0)
PLATELETS: 392 10*3/uL (ref 150–400)
RBC: 4.34 MIL/uL (ref 3.87–5.11)
RDW: 13.7 % (ref 11.5–15.5)
WBC: 14.3 10*3/uL — ABNORMAL HIGH (ref 4.0–10.5)

## 2016-11-12 LAB — C-REACTIVE PROTEIN: CRP: 15.9 mg/dL — ABNORMAL HIGH (ref <1.0)

## 2016-11-12 MED ORDER — PIPERACILLIN-TAZOBACTAM 3.375 G IVPB
3.3750 g | Freq: Three times a day (TID) | INTRAVENOUS | Status: DC
  Administered 2016-11-12 – 2016-11-14 (×6): 3.375 g via INTRAVENOUS
  Filled 2016-11-12 (×9): qty 50

## 2016-11-12 MED ORDER — LOSARTAN POTASSIUM 50 MG PO TABS
50.0000 mg | ORAL_TABLET | Freq: Every day | ORAL | Status: DC
  Administered 2016-11-12 – 2016-11-14 (×2): 50 mg via ORAL
  Filled 2016-11-12 (×3): qty 1

## 2016-11-12 MED ORDER — OXYCODONE HCL 5 MG PO TABS
5.0000 mg | ORAL_TABLET | ORAL | Status: DC | PRN
  Administered 2016-11-12: 19:00:00 10 mg via ORAL
  Administered 2016-11-12: 5 mg via ORAL
  Administered 2016-11-12 – 2016-11-15 (×8): 10 mg via ORAL
  Administered 2016-11-15: 5 mg via ORAL
  Administered 2016-11-15: 08:00:00 10 mg via ORAL
  Administered 2016-11-15 – 2016-11-16 (×5): 5 mg via ORAL
  Administered 2016-11-17 (×2): 10 mg via ORAL
  Filled 2016-11-12 (×7): qty 2
  Filled 2016-11-12: qty 1
  Filled 2016-11-12: qty 2
  Filled 2016-11-12: qty 1
  Filled 2016-11-12 (×3): qty 2
  Filled 2016-11-12: qty 1
  Filled 2016-11-12: qty 2
  Filled 2016-11-12 (×2): qty 1
  Filled 2016-11-12: qty 2
  Filled 2016-11-12: qty 1
  Filled 2016-11-12: qty 2

## 2016-11-12 MED ORDER — FOLIC ACID 1 MG PO TABS
500.0000 ug | ORAL_TABLET | Freq: Every day | ORAL | Status: DC
  Administered 2016-11-12 – 2016-11-19 (×8): 0.5 mg via ORAL
  Filled 2016-11-12 (×9): qty 1

## 2016-11-12 MED ORDER — DIPHENHYDRAMINE HCL 12.5 MG/5ML PO ELIX: 12.5000 mg | ORAL_SOLUTION | ORAL | Status: DC | PRN

## 2016-11-12 MED ORDER — LORAZEPAM 1 MG PO TABS
1.0000 mg | ORAL_TABLET | Freq: Four times a day (QID) | ORAL | Status: DC | PRN
  Administered 2016-11-17: 1 mg via ORAL
  Filled 2016-11-12: qty 1

## 2016-11-12 MED ORDER — ACETAMINOPHEN 325 MG PO TABS: 650.0000 mg | ORAL_TABLET | Freq: Four times a day (QID) | ORAL | Status: DC | PRN

## 2016-11-12 MED ORDER — PIPERACILLIN-TAZOBACTAM 3.375 G IVPB 30 MIN
3.3750 g | Freq: Once | INTRAVENOUS | Status: DC
  Filled 2016-11-12: qty 50

## 2016-11-12 MED ORDER — HYDROMORPHONE HCL 1 MG/ML IJ SOLN
1.0000 mg | INTRAMUSCULAR | Status: DC | PRN
  Administered 2016-11-14 – 2016-11-17 (×2): 1 mg via INTRAVENOUS
  Filled 2016-11-12 (×2): qty 1

## 2016-11-12 MED ORDER — VANCOMYCIN HCL 10 G IV SOLR
1500.0000 mg | INTRAVENOUS | Status: DC
  Administered 2016-11-12 – 2016-11-14 (×3): 1500 mg via INTRAVENOUS
  Filled 2016-11-12 (×3): qty 1500

## 2016-11-12 MED ORDER — VANCOMYCIN HCL IN DEXTROSE 1-5 GM/200ML-% IV SOLN
1000.0000 mg | Freq: Once | INTRAVENOUS | Status: DC
  Filled 2016-11-12: qty 200

## 2016-11-12 MED ORDER — SODIUM CHLORIDE 0.9% FLUSH
10.0000 mL | INTRAVENOUS | Status: DC | PRN
  Administered 2016-11-13 – 2016-11-18 (×4): 10 mL
  Filled 2016-11-12 (×4): qty 40

## 2016-11-12 MED ORDER — HYDROCHLOROTHIAZIDE 12.5 MG PO CAPS
12.5000 mg | ORAL_CAPSULE | Freq: Every day | ORAL | Status: DC
  Administered 2016-11-12 – 2016-11-19 (×4): 12.5 mg via ORAL
  Filled 2016-11-12 (×7): qty 1

## 2016-11-12 MED ORDER — METHOCARBAMOL 500 MG PO TABS
500.0000 mg | ORAL_TABLET | Freq: Four times a day (QID) | ORAL | Status: DC | PRN
  Administered 2016-11-12 – 2016-11-15 (×6): 500 mg via ORAL
  Filled 2016-11-12 (×6): qty 1

## 2016-11-12 MED ORDER — ACETAMINOPHEN 650 MG RE SUPP: 650.0000 mg | Freq: Four times a day (QID) | RECTAL | Status: DC | PRN

## 2016-11-12 MED ORDER — SODIUM CHLORIDE 0.9 % IV SOLN
INTRAVENOUS | Status: DC
  Administered 2016-11-12: 11:00:00 via INTRAVENOUS
  Administered 2016-11-13: 18:00:00 75 mL/h via INTRAVENOUS
  Administered 2016-11-13 – 2016-11-16 (×5): via INTRAVENOUS

## 2016-11-12 MED ORDER — METHOCARBAMOL 1000 MG/10ML IJ SOLN
500.0000 mg | Freq: Four times a day (QID) | INTRAVENOUS | Status: DC | PRN
  Filled 2016-11-12: qty 5

## 2016-11-12 MED ORDER — ADULT MULTIVITAMIN W/MINERALS CH
1.0000 | ORAL_TABLET | ORAL | Status: DC
  Administered 2016-11-12 – 2016-11-18 (×5): 1 via ORAL
  Filled 2016-11-12 (×6): qty 1

## 2016-11-12 MED ORDER — ONDANSETRON HCL 4 MG/2ML IJ SOLN
4.0000 mg | Freq: Four times a day (QID) | INTRAMUSCULAR | Status: DC | PRN
  Administered 2016-11-12 – 2016-11-18 (×2): 4 mg via INTRAVENOUS
  Filled 2016-11-12 (×2): qty 2

## 2016-11-12 NOTE — Progress Notes (Addendum)
Pharmacy Antibiotic Note  Laura Chambers is a 76 y.o. female admitted on 11/11/2016 with cellulitis.  Pharmacy has been consulted for vancomycin and zosyn dosing. WBC 14.8, creat 1.01, AF, wt 85.7 kg. s/p Knee replacement in 2001, saw ortho 5/7 with arthrocentesis which showed crystals and  Increased WBC.  Plan: Vancomycin 1500 mg IV every 24 hours.  Goal trough 10-15 mcg/mL. Zosyn 3.375g IV q8h (4 hour infusion).  f/u renal fxn, wbc, temp, culture data Vancomycin levels as needed  Height: 5\' 7"  (170.2 cm) Weight: 189 lb (85.7 kg) IBW/kg (Calculated) : 61.6  Temp (24hrs), Avg:98.8 F (37.1 C), Min:98.3 F (36.8 C), Max:99.5 F (37.5 C)   Recent Labs Lab 11/11/16 2023  WBC 14.8*  CREATININE 1.01*    Estimated Creatinine Clearance: 53.3 mL/min (A) (by C-G formula based on SCr of 1.01 mg/dL (H)).    Allergies  Allergen Reactions  . Epinephrine Other (See Comments)    Reaction:  Increased pts BP and caused shoulder to jerk   . Levofloxacin Nausea And Vomiting  . Lipitor [Atorvastatin] Other (See Comments)    Reaction:  Joint pain and dizziness   . Trovan [Alatrofloxacin] Nausea And Vomiting    Pt states that this med caused pancreatitis.    Antimicrobials this admission: vanc 5/9>> Zosyn 5/9>> Dose adjustments this admission:   Microbiology results: 5/7 gram stain> 5/7 synovial fluid L knee joint>>  Thank you for allowing pharmacy to be a part of this patient's care.  Eudelia Bunch, Pharm.D. [phone] 11/12/2016 7:31 AM

## 2016-11-12 NOTE — H&P (Signed)
Laura Chambers is an 76 y.o. female.   Chief Complaint: left leg/knee pain and redness HPI:   76 yo female who had a left total knee replacement by another Ortho MD/group here in [place] in 2001.  Had a lot of drainage back then and was apparently treated for an infection, but I have no records.  With continued left chronic knee pain, she came to me for a second opinion in December 2017.  A bone scan was order suggesting loosening of the implants.  She then went back to her original group.  She then presented back to my office on Monday complaining of severe left leg pain.  An aspiration of the knee obtained only about 10 cc of fluid, but 28,000 WBC.  Gram stain and culture is still pending.  She then presented to the ED last evening with worsening pain.  She is being admitted for IV antibiotics.  She still request me for further evaluation and treatment due to the difficulty getting in to her other Ortho group.  Past Medical History:  Diagnosis Date  . Anxiety   . Arthritis   . Complication of anesthesia 2012   "irritated trachea" with cough x 1 year from intubation  . Esophageal hernia   . Fibromyalgia   . HTN (hypertension)   . Hx of colonic polyps   . Hydronephrosis of right kidney   . Hypercholesterolemia   . 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 Obesity   . 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 Renal angiomyolipoma     Past Surgical History:  Procedure Laterality Date  . ABDOMINAL HYSTERECTOMY  1969  . BACK SURGERY  2012   rod in back-  lumbar disc removal with bone graft  . blephoroplasty     bilateral  . BREAST BIOPSY  1989  . CHOLECYSTECTOMY  2010  . NISSEN FUNDOPLICATION    . PARAESOPHAGEAL HERNIA REPAIR  1989  . TONSILLECTOMY  1946  . TOTAL HIP ARTHROPLASTY Right 08/31/2012   Procedure: TOTAL HIP ARTHROPLASTY ANTERIOR APPROACH;  Surgeon: Mauri Pole, MD;  Location: [facility];  Service: Orthopedics;  Laterality:  Right;  . TOTAL KNEE ARTHROPLASTY  2001   left  . TOTAL KNEE ARTHROPLASTY  2010   right    Family History  Problem Relation Age of Onset  . Diabetes Father   . Osteoarthritis Father   . Heart disease Father   . Stroke Father   . Osteoarthritis Mother   . Stroke Mother   . Pneumonia Mother   . Colon cancer Sister 24  . Colon polyps      neice/nephew  . Diabetes Sister   . Diabetes Brother    Social History:  reports that she has never smoked. She has never used smokeless tobacco. She reports that she does not drink alcohol or use drugs.  Allergies:  Allergies  Allergen Reactions  . Epinephrine Other (See Comments)    Reaction:  Increased pts BP and caused shoulder to jerk   . Levofloxacin Nausea And Vomiting  . Lipitor [Atorvastatin] Other (See Comments)    Reaction:  Joint pain and dizziness   . Trovan [Alatrofloxacin] Nausea And Vomiting    Pt states that this med caused pancreatitis.    Medications Prior to Admission  Medication Sig Dispense Refill  . aspirin EC 81 MG tablet Take 81 mg by mouth daily.    Marland Kitchen atenolol (TENORMIN) 25  MG tablet TAKE 1 TABLET BY MOUTH DAILY. 90 tablet 1  . Calcium Carbonate-Vitamin D (CALCIUM 600+D) 600-400 MG-UNIT tablet Take 1 tablet by mouth every other day.    Marland Kitchen doxycycline (VIBRA-TABS) 100 MG tablet Take 1 tablet (100 mg total) by mouth 2 (two) times daily. 28 tablet 0  . folic acid (FOLVITE) 224 MCG tablet Take 400 mcg by mouth daily.    Marland Kitchen gabapentin (NEURONTIN) 300 MG capsule Take 2 capsules (600 mg total) by mouth 3 (three) times daily. 180 capsule 2  . HYDROcodone-acetaminophen (NORCO/VICODIN) 5-325 MG tablet Take 1 tablet by mouth every 6 (six) hours as needed for moderate pain. 30 tablet 0  . losartan-hydrochlorothiazide (HYZAAR) 50-12.5 MG tablet Take 1 tablet by mouth daily. 90 tablet 3  . Multiple Vitamin (MULTIVITAMIN WITH MINERALS) TABS tablet Take 1 tablet by mouth every other day.    . promethazine (PHENERGAN) 25 MG tablet  Take 25 mg by mouth every 8 (eight) hours as needed for nausea or vomiting.   0  . rosuvastatin (CRESTOR) 10 MG tablet Take 1 tablet (10 mg total) by mouth daily. 90 tablet 1    Results for orders placed or performed during the hospital encounter of 11/11/16 (from the past 48 hour(s))  CBC with Differential/Platelet     Status: Abnormal   Collection Time: 11/11/16  8:23 PM  Result Value Ref Range   WBC 14.8 (H) 4.0 - 10.5 K/uL   RBC 4.32 3.87 - 5.11 MIL/uL   Hemoglobin 12.9 12.0 - 15.0 g/dL   HCT 38.1 36.0 - 46.0 %   MCV 88.2 78.0 - 100.0 fL   MCH 29.9 26.0 - 34.0 pg   MCHC 33.9 30.0 - 36.0 g/dL   RDW 13.7 11.5 - 15.5 %   Platelets 376 150 - 400 K/uL   Neutrophils Relative % 76 %   Neutro Abs 11.3 (H) 1.7 - 7.7 K/uL   Lymphocytes Relative 14 %   Lymphs Abs 2.0 0.7 - 4.0 K/uL   Monocytes Relative 9 %   Monocytes Absolute 1.3 (H) 0.1 - 1.0 K/uL   Eosinophils Relative 1 %   Eosinophils Absolute 0.1 0.0 - 0.7 K/uL   Basophils Relative 0 %   Basophils Absolute 0.0 0.0 - 0.1 K/uL  Basic metabolic panel     Status: Abnormal   Collection Time: 11/11/16  8:23 PM  Result Value Ref Range   Sodium 136 135 - 145 mmol/L   Potassium 4.2 3.5 - 5.1 mmol/L   Chloride 97 (L) 101 - 111 mmol/L   CO2 31 22 - 32 mmol/L   Glucose, Bld 121 (H) 65 - 99 mg/dL   BUN 19 6 - 20 mg/dL   Creatinine, Ser 1.01 (H) 0.44 - 1.00 mg/dL   Calcium 9.2 8.9 - 10.3 mg/dL   GFR calc non Af Amer 53 (L) >60 mL/min   GFR calc Af Amer >60 >60 mL/min    Comment: (NOTE) The eGFR has been calculated using the CKD EPI equation. This calculation has not been validated in all clinical situations. eGFR's persistently <60 mL/min signify possible Chronic Kidney Disease.    Anion gap 8 5 - 15   No results found.  Review of Systems  All other systems reviewed and are negative.   Blood pressure (!) 107/47, pulse 89, temperature 99.5 F (37.5 C), temperature source Oral, resp. rate 18, height '5\' 7"'  (1.702 m), weight 189  lb (85.7 kg), SpO2 96 %. Physical Exam  Constitutional: She is  oriented to person, place, and time. She appears well-developed and well-nourished.  HENT:  Head: Normocephalic and atraumatic.  Eyes: EOM are normal. Pupils are equal, round, and reactive to light.  Neck: Normal range of motion.  Cardiovascular: Normal rate and regular rhythm.   Respiratory: Effort normal and breath sounds normal.  GI: Soft. Bowel sounds are normal.  Musculoskeletal:       Legs: Neurological: She is alert and oriented to person, place, and time.  Psychiatric: She has a normal mood and affect.     Assessment/Plan  Left leg cellulitis 1)  Will have Pharmacy dose Vanc/Zosyn and obtain an ultrasound to rule-out DVT.  She will need hydration and elevation of her leg.  She understands fully that there is a good chance that she will need to have surgery on her left knee to remove all the implant if there is high suspicion of an infected total joint.  [name], MD 11/12/2016, 7:15 AM

## 2016-11-12 NOTE — Progress Notes (Signed)
VASCULAR LAB PRELIMINARY  PRELIMINARY  PRELIMINARY  PRELIMINARY  Left lower extremity venous duplex completed.    Preliminary report:  Left:  No evidence of DVT, superficial thrombosis, or Baker's cyst. Enlargement of the inguinal lymph nodes noted.  Laura Chambers, [place], RVS 11/12/2016, 4:08 PM

## 2016-11-12 NOTE — Progress Notes (Signed)
Peripherally Inserted Central Catheter/Midline Placement  The IV Nurse has discussed with the patient and/or persons authorized to consent for the patient, the purpose of this procedure and the potential benefits and risks involved with this procedure.  The benefits include less needle sticks, lab draws from the catheter, and the patient may be discharged home with the catheter. Risks include, but not limited to, infection, bleeding, blood clot (thrombus formation), and puncture of an artery; nerve damage and irregular heartbeat and possibility to perform a PICC exchange if needed/ordered by physician.  Alternatives to this procedure were also discussed.  Bard Power PICC patient education guide, fact sheet on infection prevention and patient information card has been provided to patient /or left at bedside.    PICC/Midline Placement Documentation        Laura Chambers, Laura Chambers 11/12/2016, 9:26 PM

## 2016-11-13 LAB — CBC
HCT: 34.7 % — ABNORMAL LOW (ref 36.0–46.0)
Hemoglobin: 11.1 g/dL — ABNORMAL LOW (ref 12.0–15.0)
MCH: 28.6 pg (ref 26.0–34.0)
MCHC: 32 g/dL (ref 30.0–36.0)
MCV: 89.4 fL (ref 78.0–100.0)
PLATELETS: 357 10*3/uL (ref 150–400)
RBC: 3.88 MIL/uL (ref 3.87–5.11)
RDW: 14.3 % (ref 11.5–15.5)
WBC: 15.4 10*3/uL — ABNORMAL HIGH (ref 4.0–10.5)

## 2016-11-13 LAB — GLUCOSE, CAPILLARY
GLUCOSE-CAPILLARY: 127 mg/dL — AB (ref 65–99)
GLUCOSE-CAPILLARY: 145 mg/dL — AB (ref 65–99)
GLUCOSE-CAPILLARY: 134 mg/dL — AB (ref 65–99)
GLUCOSE-CAPILLARY: 153 mg/dL — AB (ref 65–99)

## 2016-11-13 LAB — URIC ACID: URIC ACID, SERUM: 5.4 mg/dL (ref 2.3–6.6)

## 2016-11-13 NOTE — Progress Notes (Signed)
Patient ID: Laura Chambers, female   DOB: 08-19-1940, 76 y.o.   MRN: [id number] Cellulitis of the left leg much less today compared to yesterday.  Vitals stable, but WBC still elevated.  Gram stain was negative from the knee aspiration, which was performed prior to being on antibiotics.  She is very sleepy right now.  Will discuss the possibility of removing her total knee.  Last evening, she expressed the need to wait longer to see how she responded to antibiotics.  Will re-assess her in the morning.

## 2016-11-14 ENCOUNTER — Inpatient Hospital Stay (HOSPITAL_COMMUNITY): Payer: PPO | Admitting: Anesthesiology

## 2016-11-14 ENCOUNTER — Encounter (HOSPITAL_COMMUNITY)
Admission: EM | Disposition: A | Discharge status: Skilled Nursing Facility | Payer: Self-pay | Source: Home / Self Care | Attending: Orthopaedic Surgery

## 2016-11-14 ENCOUNTER — Encounter (HOSPITAL_COMMUNITY): Payer: Self-pay | Admitting: Registered Nurse

## 2016-11-14 DIAGNOSIS — L03116 Cellulitis of left lower limb: Secondary | ICD-10-CM

## 2016-11-14 HISTORY — PX: EXCISIONAL TOTAL KNEE ARTHROPLASTY WITH ANTIBIOTIC SPACERS: SHX5827

## 2016-11-14 LAB — BASIC METABOLIC PANEL
Anion gap: 12 (ref 5–15)
BUN: 48 mg/dL — AB (ref 6–20)
CHLORIDE: 96 mmol/L — AB (ref 101–111)
CO2: 24 mmol/L (ref 22–32)
Calcium: 8.2 mg/dL — ABNORMAL LOW (ref 8.9–10.3)
Creatinine, Ser: 2.56 mg/dL — ABNORMAL HIGH (ref 0.44–1.00)
GFR, EST AFRICAN AMERICAN: 20 mL/min — AB (ref >60)
GFR, EST NON AFRICAN AMERICAN: 17 mL/min — AB (ref >60)
Glucose, Bld: 99 mg/dL (ref 65–99)
Potassium: 3.8 mmol/L (ref 3.5–5.1)
SODIUM: 132 mmol/L — AB (ref 135–145)

## 2016-11-14 LAB — SURGICAL PCR SCREEN
MRSA, PCR: NEGATIVE
STAPHYLOCOCCUS AUREUS: NEGATIVE

## 2016-11-14 LAB — CBC
HEMATOCRIT: 30.8 % — AB (ref 36.0–46.0)
Hemoglobin: 9.7 g/dL — ABNORMAL LOW (ref 12.0–15.0)
MCH: 28.3 pg (ref 26.0–34.0)
MCHC: 31.5 g/dL (ref 30.0–36.0)
MCV: 89.8 fL (ref 78.0–100.0)
Platelets: 307 10*3/uL (ref 150–400)
RBC: 3.43 MIL/uL — ABNORMAL LOW (ref 3.87–5.11)
RDW: 14.3 % (ref 11.5–15.5)
WBC: 15 10*3/uL — ABNORMAL HIGH (ref 4.0–10.5)

## 2016-11-14 LAB — GLUCOSE, CAPILLARY
Glucose-Capillary: 79 mg/dL (ref 65–99)
Glucose-Capillary: 96 mg/dL (ref 65–99)

## 2016-11-14 LAB — PREPARE RBC (CROSSMATCH)

## 2016-11-14 SURGERY — REMOVAL, TOTAL ARTHROPLASTY HARDWARE, KNEE, WITH ANTIBIOTIC SPACER INSERTION
Anesthesia: General | Site: Knee | Laterality: Left

## 2016-11-14 MED ORDER — HYDROMORPHONE HCL 1 MG/ML IJ SOLN
INTRAMUSCULAR | Status: AC
  Administered 2016-11-14: 0.5 mg via INTRAVENOUS
  Filled 2016-11-14: qty 0.5

## 2016-11-14 MED ORDER — SODIUM CHLORIDE 0.9 % IR SOLN
Status: DC | PRN
  Administered 2016-11-14: 4000 mL

## 2016-11-14 MED ORDER — ONDANSETRON HCL 4 MG/2ML IJ SOLN
INTRAMUSCULAR | Status: AC
  Filled 2016-11-14: qty 2

## 2016-11-14 MED ORDER — FENTANYL CITRATE (PF) 100 MCG/2ML IJ SOLN
INTRAMUSCULAR | Status: AC
  Filled 2016-11-14: qty 2

## 2016-11-14 MED ORDER — PHENYLEPHRINE HCL 10 MG/ML IJ SOLN
INTRAMUSCULAR | Status: DC | PRN
  Administered 2016-11-14: 30 ug/min via INTRAVENOUS

## 2016-11-14 MED ORDER — PHENYLEPHRINE HCL 10 MG/ML IJ SOLN
INTRAMUSCULAR | Status: DC | PRN
  Administered 2016-11-14: 160 ug via INTRAVENOUS
  Administered 2016-11-14 (×2): 120 ug via INTRAVENOUS

## 2016-11-14 MED ORDER — PIPERACILLIN-TAZOBACTAM 3.375 G IVPB 30 MIN
3.3750 g | INTRAVENOUS | Status: DC
  Filled 2016-11-14: qty 50

## 2016-11-14 MED ORDER — PROPOFOL 10 MG/ML IV BOLUS
INTRAVENOUS | Status: AC
  Filled 2016-11-14: qty 60

## 2016-11-14 MED ORDER — PROPOFOL 10 MG/ML IV BOLUS
INTRAVENOUS | Status: DC | PRN
  Administered 2016-11-14: 120 mg via INTRAVENOUS
  Administered 2016-11-14: 30 mg via INTRAVENOUS

## 2016-11-14 MED ORDER — MINERAL OIL LIGHT 100 % EX OIL
TOPICAL_OIL | CUTANEOUS | Status: AC
  Filled 2016-11-14: qty 25

## 2016-11-14 MED ORDER — HYDROMORPHONE HCL 1 MG/ML IJ SOLN
0.2500 mg | INTRAMUSCULAR | Status: DC | PRN
  Administered 2016-11-14: 0.5 mg via INTRAVENOUS

## 2016-11-14 MED ORDER — SODIUM CHLORIDE 0.9 % IV SOLN
INTRAVENOUS | Status: DC | PRN
  Administered 2016-11-14 (×2): via INTRAVENOUS

## 2016-11-14 MED ORDER — FENTANYL CITRATE (PF) 250 MCG/5ML IJ SOLN
INTRAMUSCULAR | Status: AC
  Filled 2016-11-14: qty 5

## 2016-11-14 MED ORDER — MIDAZOLAM HCL 5 MG/5ML IJ SOLN
INTRAMUSCULAR | Status: DC | PRN
  Administered 2016-11-14 (×2): 1 mg via INTRAVENOUS

## 2016-11-14 MED ORDER — ROCURONIUM BROMIDE 100 MG/10ML IV SOLN
INTRAVENOUS | Status: DC | PRN
  Administered 2016-11-14: 50 mg via INTRAVENOUS

## 2016-11-14 MED ORDER — CEFAZOLIN SODIUM-DEXTROSE 1-4 GM/50ML-% IV SOLN
1.0000 g | Freq: Two times a day (BID) | INTRAVENOUS | Status: DC
  Administered 2016-11-14 – 2016-11-16 (×4): 1 g via INTRAVENOUS
  Filled 2016-11-14 (×4): qty 50

## 2016-11-14 MED ORDER — ARTIFICIAL TEARS OP OINT
TOPICAL_OINTMENT | OPHTHALMIC | Status: AC
  Filled 2016-11-14: qty 3.5

## 2016-11-14 MED ORDER — PIPERACILLIN-TAZOBACTAM 3.375 G IVPB
INTRAVENOUS | Status: AC
  Filled 2016-11-14: qty 50

## 2016-11-14 MED ORDER — ONDANSETRON HCL 4 MG/2ML IJ SOLN
INTRAMUSCULAR | Status: DC | PRN
  Administered 2016-11-14: 4 mg via INTRAVENOUS

## 2016-11-14 MED ORDER — SUGAMMADEX SODIUM 200 MG/2ML IV SOLN
INTRAVENOUS | Status: AC
  Filled 2016-11-14: qty 2

## 2016-11-14 MED ORDER — ASPIRIN 325 MG PO TABS
325.0000 mg | ORAL_TABLET | Freq: Two times a day (BID) | ORAL | Status: DC
  Administered 2016-11-15 – 2016-11-17 (×6): 325 mg via ORAL
  Filled 2016-11-14 (×7): qty 1

## 2016-11-14 MED ORDER — SODIUM CHLORIDE 0.9 % IV SOLN: Freq: Once | INTRAVENOUS | Status: DC

## 2016-11-14 MED ORDER — 0.9 % SODIUM CHLORIDE (POUR BTL) OPTIME
TOPICAL | Status: DC | PRN
  Administered 2016-11-14: 1000 mL

## 2016-11-14 MED ORDER — SUGAMMADEX SODIUM 200 MG/2ML IV SOLN
INTRAVENOUS | Status: DC | PRN
  Administered 2016-11-14: 150 mg via INTRAVENOUS

## 2016-11-14 MED ORDER — DEXAMETHASONE SODIUM PHOSPHATE 10 MG/ML IJ SOLN
INTRAMUSCULAR | Status: DC | PRN
  Administered 2016-11-14: 10 mg via INTRAVENOUS

## 2016-11-14 MED ORDER — FUROSEMIDE 10 MG/ML IJ SOLN
20.0000 mg | Freq: Once | INTRAMUSCULAR | Status: AC
  Administered 2016-11-14: 21:00:00 20 mg via INTRAVENOUS
  Filled 2016-11-14: qty 2

## 2016-11-14 MED ORDER — PHENYLEPHRINE 40 MCG/ML (10ML) SYRINGE FOR IV PUSH (FOR BLOOD PRESSURE SUPPORT)
PREFILLED_SYRINGE | INTRAVENOUS | Status: AC
  Filled 2016-11-14: qty 10

## 2016-11-14 MED ORDER — PROMETHAZINE HCL 25 MG/ML IJ SOLN: 6.2500 mg | INTRAMUSCULAR | Status: DC | PRN

## 2016-11-14 MED ORDER — ACETAMINOPHEN 10 MG/ML IV SOLN
INTRAVENOUS | Status: AC
  Filled 2016-11-14: qty 100

## 2016-11-14 MED ORDER — DEXAMETHASONE SODIUM PHOSPHATE 10 MG/ML IJ SOLN
INTRAMUSCULAR | Status: AC
  Filled 2016-11-14: qty 1

## 2016-11-14 MED ORDER — FENTANYL CITRATE (PF) 100 MCG/2ML IJ SOLN
INTRAMUSCULAR | Status: DC | PRN
  Administered 2016-11-14 (×6): 50 ug via INTRAVENOUS

## 2016-11-14 MED ORDER — MIDAZOLAM HCL 2 MG/2ML IJ SOLN
INTRAMUSCULAR | Status: AC
  Filled 2016-11-14: qty 2

## 2016-11-14 MED ORDER — PHENYLEPHRINE HCL 10 MG/ML IJ SOLN
INTRAMUSCULAR | Status: AC
Start: 2016-11-14 — End: 2016-11-14
  Filled 2016-11-14: qty 1

## 2016-11-14 MED ORDER — LIDOCAINE HCL (CARDIAC) 20 MG/ML IV SOLN
INTRAVENOUS | Status: DC | PRN
  Administered 2016-11-14: 50 mg via INTRAVENOUS

## 2016-11-14 SURGICAL SUPPLY — 77 items
AGENT HMST KT MTR STRL THRMB (HEMOSTASIS)
BAG SPEC THK2 15X12 ZIP CLS (MISCELLANEOUS)
BAG ZIPLOCK 12X15 (MISCELLANEOUS) ×1 IMPLANT
BANDAGE ACE 4X5 VEL STRL LF (GAUZE/BANDAGES/DRESSINGS) ×3 IMPLANT
BANDAGE ACE 6X5 VEL STRL LF (GAUZE/BANDAGES/DRESSINGS) ×3 IMPLANT
BANDAGE ESMARK 6X9 LF (GAUZE/BANDAGES/DRESSINGS) ×1 IMPLANT
BLADE 10 SAFETY STRL DISP (BLADE) ×2 IMPLANT
BNDG CMPR 9X6 STRL LF SNTH (GAUZE/BANDAGES/DRESSINGS)
BNDG ESMARK 6X9 LF (GAUZE/BANDAGES/DRESSINGS)
BONE CEMENT GENTAMICIN (Cement) ×12 IMPLANT
CEMENT BONE GENTAMICIN 40 (Cement) IMPLANT
CLOSURE WOUND 1/2 X4 (GAUZE/BANDAGES/DRESSINGS)
COVER SURGICAL LIGHT HANDLE (MISCELLANEOUS) ×3 IMPLANT
CUFF TOURN SGL QUICK 34 (TOURNIQUET CUFF) ×3
CUFF TRNQT CYL 34X4X40X1 (TOURNIQUET CUFF) ×1 IMPLANT
DECANTER SPIKE VIAL GLASS SM (MISCELLANEOUS) ×1 IMPLANT
DRAPE EXTREMITY T 121X128X90 (DRAPE) ×3 IMPLANT
DRAPE POUCH INSTRU U-SHP 10X18 (DRAPES) ×1 IMPLANT
DRAPE U-SHAPE 47X51 STRL (DRAPES) ×3 IMPLANT
DRSG ADAPTIC 3X8 NADH LF (GAUZE/BANDAGES/DRESSINGS) ×1 IMPLANT
DRSG PAD ABDOMINAL 8X10 ST (GAUZE/BANDAGES/DRESSINGS) ×4 IMPLANT
ELECT REM PT RETURN 15FT ADLT (MISCELLANEOUS) ×3 IMPLANT
EVACUATOR 1/8 PVC DRAIN (DRAIN) ×1 IMPLANT
EVACUATOR SILICONE 100CC (DRAIN) ×1 IMPLANT
FACESHIELD WRAPAROUND (MASK) IMPLANT
FACESHIELD WRAPAROUND OR TEAM (MASK) ×5 IMPLANT
GAUZE SPONGE 4X4 12PLY STRL (GAUZE/BANDAGES/DRESSINGS) ×4 IMPLANT
GAUZE XEROFORM 1X8 LF (GAUZE/BANDAGES/DRESSINGS) ×2 IMPLANT
GLOVE BIO SURGEON STRL SZ7.5 (GLOVE) ×4 IMPLANT
GLOVE BIOGEL PI IND STRL 7.5 (GLOVE) IMPLANT
GLOVE BIOGEL PI IND STRL 8 (GLOVE) ×1 IMPLANT
GLOVE BIOGEL PI INDICATOR 7.5 (GLOVE) ×2
GLOVE BIOGEL PI INDICATOR 8 (GLOVE) ×4
GLOVE ECLIPSE 8.0 STRL XLNG CF (GLOVE) ×7 IMPLANT
GLOVE ORTHO TXT STRL SZ7.5 (GLOVE) ×2 IMPLANT
GLOVE SURG ORTHO 8.0 STRL STRW (GLOVE) ×2 IMPLANT
GLOVE SURG SS PI 7.5 STRL IVOR (GLOVE) ×2 IMPLANT
GOWN STRL REUS W/TWL XL LVL3 (GOWN DISPOSABLE) ×5 IMPLANT
HANDPIECE INTERPULSE COAX TIP (DISPOSABLE) ×3
IMMOBILIZER KNEE 20 (SOFTGOODS) ×3
IMMOBILIZER KNEE 20 THIGH 36 (SOFTGOODS) IMPLANT
KIT BASIN OR (CUSTOM PROCEDURE TRAY) ×1 IMPLANT
NDL SAFETY ECLIPSE 18X1.5 (NEEDLE) ×1 IMPLANT
NEEDLE HYPO 18GX1.5 SHARP (NEEDLE)
NS IRRIG 1000ML POUR BTL (IV SOLUTION) ×4 IMPLANT
PACK TOTAL KNEE CUSTOM (KITS) ×2 IMPLANT
PADDING CAST COTTON 6X4 STRL (CAST SUPPLIES) ×6 IMPLANT
POSITIONER SURGICAL ARM (MISCELLANEOUS) ×1 IMPLANT
SET HNDPC FAN SPRY TIP SCT (DISPOSABLE) ×1 IMPLANT
SLEEVE SURGEON STRL (DRAPES) ×2 IMPLANT
SPACER KASM MOLD44APX67ML KNEE (Spacer) ×2 IMPLANT
SPACER KASM MOLD44APX70ML KNEE (Spacer) ×2 IMPLANT
SPONGE LAP 18X18 X RAY DECT (DISPOSABLE) ×1 IMPLANT
STAPLER VISISTAT (STAPLE) ×2 IMPLANT
STAPLER VISISTAT 35W (STAPLE) ×2 IMPLANT
STRIP CLOSURE SKIN 1/2X4 (GAUZE/BANDAGES/DRESSINGS) ×2 IMPLANT
SUCTION FRAZIER HANDLE 12FR (TUBING) ×2
SUCTION TUBE FRAZIER 12FR DISP (TUBING) ×1 IMPLANT
SURGIFLO W/THROMBIN 8M KIT (HEMOSTASIS) ×1 IMPLANT
SUT MNCRL AB 4-0 PS2 18 (SUTURE) ×1 IMPLANT
SUT VIC AB 0 CT1 27 (SUTURE) ×3
SUT VIC AB 0 CT1 27XBRD ANTBC (SUTURE) IMPLANT
SUT VIC AB 1 CT1 27 (SUTURE) ×9
SUT VIC AB 1 CT1 27XBRD ANTBC (SUTURE) IMPLANT
SUT VIC AB 1 CT1 36 (SUTURE) ×3 IMPLANT
SUT VIC AB 2-0 CT1 27 (SUTURE) ×9
SUT VIC AB 2-0 CT1 TAPERPNT 27 (SUTURE) ×3 IMPLANT
SWAB COLLECTION DEVICE MRSA (MISCELLANEOUS) ×3 IMPLANT
SWAB CULTURE ESWAB REG 1ML (MISCELLANEOUS) ×3 IMPLANT
SYR 50ML LL SCALE MARK (SYRINGE) ×1 IMPLANT
SYR CONTROL 10ML LL (SYRINGE) ×1 IMPLANT
TIP HIGH FLOW IRRIGATION COAX (MISCELLANEOUS) ×4 IMPLANT
TOWEL OR 17X26 10 PK STRL BLUE (TOWEL DISPOSABLE) ×3 IMPLANT
TRAY FOLEY W/METER SILVER 16FR (SET/KITS/TRAYS/PACK) ×1 IMPLANT
WATER STERILE IRR 1500ML POUR (IV SOLUTION) ×3 IMPLANT
WRAP KNEE MAXI GEL POST OP (GAUZE/BANDAGES/DRESSINGS) ×2 IMPLANT
YANKAUER SUCT BULB TIP NO VENT (SUCTIONS) ×2 IMPLANT

## 2016-11-14 NOTE — Transfer of Care (Signed)
Immediate Anesthesia Transfer of Care Note  Patient: [name]  Procedure(s) Performed: Procedure(s): EXCISIONAL LEFT TOTAL KNEE ARTHROPLASTY WITH  PLACEMENT OF ANTIBIOTIC SPACERS (Left)  Patient Location: PACU  Anesthesia Type:General  Level of Consciousness: awake, alert , oriented and patient cooperative  Airway & Oxygen Therapy: Patient Spontanous Breathing and Patient connected to face mask oxygen  Post-op Assessment: Report given to RN, Post -op Vital signs reviewed and stable and Patient moving all extremities X 4  Post vital signs: stable  Last Vitals:  Vitals:   11/14/16 1405 11/14/16 1735  BP: 113/61 133/73  Pulse: 65 79  Resp: 20 (!) 23  Temp: 37.2 C (P) 36.9 C    Last Pain:  Vitals:   11/14/16 1735  TempSrc:   PainSc: (P) 5       Patients Stated Pain Goal: 3 (46/65/99 3570)  Complications: No apparent anesthesia complications

## 2016-11-14 NOTE — Anesthesia Postprocedure Evaluation (Signed)
Anesthesia Post Note  Patient: Laura Chambers  Procedure(s) Performed: Procedure(s) (LRB): EXCISIONAL LEFT TOTAL KNEE ARTHROPLASTY WITH  PLACEMENT OF ANTIBIOTIC SPACERS (Left)  Patient location during evaluation: PACU Anesthesia Type: General Level of consciousness: awake and alert Pain management: pain level controlled Vital Signs Assessment: post-procedure vital signs reviewed and stable Respiratory status: spontaneous breathing, nonlabored ventilation, respiratory function stable and patient connected to nasal cannula oxygen Cardiovascular status: blood pressure returned to baseline and stable Postop Assessment: no signs of nausea or vomiting Anesthetic complications: no       Last Vitals:  Vitals:   11/14/16 1815 11/14/16 1829  BP: (!) 145/66   Pulse: 80 80  Resp: 16   Temp:      Last Pain:  Vitals:   11/14/16 1811  TempSrc:   PainSc: 8                  Tiajuana Amass

## 2016-11-14 NOTE — Anesthesia Procedure Notes (Signed)
Procedure Name: Intubation Date/Time: 11/14/2016 3:11 PM Performed by: Lissa Morales Pre-anesthesia Checklist: Patient identified, Emergency Drugs available, Suction available and Patient being monitored Patient Re-evaluated:Patient Re-evaluated prior to inductionOxygen Delivery Method: Circle system utilized Preoxygenation: Pre-oxygenation with 100% oxygen Intubation Type: IV induction Ventilation: Mask ventilation without difficulty Laryngoscope Size: Mac and 4 Grade View: Grade II Tube type: Oral Tube size: 7.0 mm Number of attempts: 1 Airway Equipment and Method: Stylet and Oral airway Placement Confirmation: ETT inserted through vocal cords under direct vision,  positive ETCO2 and breath sounds checked- equal and bilateral Secured at: 22 cm Tube secured with: Tape Dental Injury: Teeth and Oropharynx as per pre-operative assessment  Comments: Pt stated had a problem with cough and reflux 1 year after GA . Elected to proceed with glidescope with good view of cords  With scope. atraumatic

## 2016-11-14 NOTE — Progress Notes (Signed)
Patient ID: Laura Chambers, female   DOB: [birth year], 76 y.o.   MRN: [id number] I spoke with the patient in detail and feel that, given no improvement in her cellulitis, her left total knee needs to be removed today and an antibiotic spacer placed.  She understands this fully.

## 2016-11-14 NOTE — Progress Notes (Signed)
PHARMACY NOTE:  ANTIMICROBIAL RENAL DOSAGE ADJUSTMENT  Current antimicrobial regimen includes a mismatch between antimicrobial dosage and estimated renal function.  As per policy approved by the Pharmacy & Therapeutics and Medical Executive Committees, the antimicrobial dosage will be adjusted accordingly.  Current antimicrobial dosage: Ancef 1gm IV q8h  Indication: Cellulitis  Renal Function:  Estimated Creatinine Clearance: 21 mL/min (A) (by C-G formula based on SCr of 2.56 mg/dL (H)). []      On intermittent HD, scheduled: []      On CRRT    Antimicrobial dosage has been changed to: Ancef 1gm IV q12h  Additional comments:   Thank you for allowing pharmacy to be a part of this patient's care.  Biagio Borg, [facility] 11/14/2016 6:53 PM

## 2016-11-14 NOTE — Brief Op Note (Signed)
11/11/2016 - 11/14/2016  5:10 PM  PATIENT:  Laura Chambers  76 y.o. female  PRE-OPERATIVE DIAGNOSIS:  failed total knee  POST-OPERATIVE DIAGNOSIS:  loose infected left total knee  PROCEDURE:  Procedure(s): EXCISIONAL LEFT TOTAL KNEE ARTHROPLASTY WITH  PLACEMENT OF ANTIBIOTIC SPACERS (Left)  SURGEON:  Surgeon(s) and Role:    [name], MD - Primary  PHYSICIAN ASSISTANT: Benita Stabile, PA-C  ANESTHESIA:   general  EBL:  Total I/O In: 1600 [I.V.:1600] Out: 900 [Urine:800; Blood:100]  COUNTS:  YES  TOURNIQUET:   Total Tourniquet Time Documented: Thigh (Left) - 75 minutes Total: Thigh (Left) - 75 minutes   DICTATION: .Other Dictation: Dictation Number [id number]  PLAN OF CARE: Admit to inpatient   PATIENT DISPOSITION:  PACU - hemodynamically stable.   Delay start of Pharmacological VTE agent (>24hrs) due to surgical blood loss or risk of bleeding: no

## 2016-11-14 NOTE — Anesthesia Preprocedure Evaluation (Addendum)
Anesthesia Evaluation  Patient identified by MRN, date of birth, ID band Patient awake    Reviewed: Allergy & Precautions, NPO status , Patient's Chart, lab work & pertinent test results, reviewed documented beta blocker date and time   Airway Mallampati: II  TM Distance: >3 FB Neck ROM: Full    Dental  (+) Dental Advisory Given   Pulmonary neg pulmonary ROS,    breath sounds clear to auscultation       Cardiovascular hypertension, Pt. on medications and Pt. on home beta blockers  Rhythm:Regular Rate:Normal     Neuro/Psych  Neuromuscular disease    GI/Hepatic Neg liver ROS, hiatal hernia,   Endo/Other  negative endocrine ROS  Renal/GU ARFRenal disease     Musculoskeletal  (+) Arthritis , Fibromyalgia -  Abdominal   Peds  Hematology  (+) anemia ,   Anesthesia Other Findings   Reproductive/Obstetrics                            Lab Results  Component Value Date   WBC 15.0 (H) 11/14/2016   HGB 9.7 (L) 11/14/2016   HCT 30.8 (L) 11/14/2016   MCV 89.8 11/14/2016   PLT 307 11/14/2016   Lab Results  Component Value Date   CREATININE 2.56 (H) 11/14/2016   BUN 48 (H) 11/14/2016   NA 132 (L) 11/14/2016   K 3.8 11/14/2016   CL 96 (L) 11/14/2016   CO2 24 11/14/2016   Lab Results  Component Value Date   INR 0.99 08/25/2012   INR 2.0 (H) 08/03/2008   INR 2.2 (H) 08/02/2008    Anesthesia Physical Anesthesia Plan  ASA: III  Anesthesia Plan: General   Post-op Pain Management:    Induction: Intravenous  Airway Management Planned: Oral ETT and Video Laryngoscope Planned  Additional Equipment:   Intra-op Plan:   Post-operative Plan: Extubation in OR  Informed Consent: I have reviewed the patients History and Physical, chart, labs and discussed the procedure including the risks, benefits and alternatives for the proposed anesthesia with the patient or authorized representative who  has indicated his/her understanding and acceptance.   Dental advisory given  Plan Discussed with: CRNA and Surgeon  Anesthesia Plan Comments:        Anesthesia Quick Evaluation

## 2016-11-14 NOTE — Op Note (Signed)
NAMELUCI, BELLUCCI NO.:  [id number]  MEDICAL RECORD NO.:  [id number]  LOCATION:  42                         FACILITY:  [facility]  PHYSICIAN:  Lind Guest. Ninfa Linden, M.D.DATE OF BIRTH:  03-04-1941  DATE OF PROCEDURE:  11/14/2016 DATE OF DISCHARGE:                              OPERATIVE REPORT   PREOPERATIVE DIAGNOSIS:  Left total knee infected arthroplasty.  POSTOPERATIVE DIAGNOSIS:  Left total knee infected arthroplasty.  PROCEDURES: 1. Irrigation and debridement of left knee including complete     synovectomy. 2. Excision of all left total knee components. 3. Placement of temporary antibiotic spacer.  IMPLANTS:  KASM cement spacer with gentamicin.  SURGEON:  Lind Guest. Ninfa Linden, M.D.  ASSISTANT:  Erskine Emery, PA-C.  ANESTHESIA:  General.  ANTIBIOTICS:  None (already on vancomycin and Zosyn).  TOURNIQUET TIME:  Under an hour and a half.  BLOOD LOSS:  Less than 200 mL.  FINDINGS:  Questionable gross purulence throughout the left knee.  COMPLICATIONS:  None.  INDICATIONS:  Ms. Allis is a 76 year old female who had a left total knee arthroplasty in 2001, by another surgeon in town.  She said that knee has always hurt her over the years.  With that being said, she still had a right total knee arthroplasty and a right hip arthroplasty by different surgeons in the same group over the last several years. Again, her left knee has always bothered her and with continued pain, she sought a second opinion with me in December 2017.  On my examination of her knee then, it felt like it was a stable knee.  She complained at times her knee would get "hot."  I did recommend a 3-phase bone scan and that was obtained, but she did not follow up in my office, preferring to go back to the original group who was taken care of originally. However, this past Monday, she presented to my office with the knee that was painful and a leg with cellulitis.  She said  she could not get into that group and said that she wanted me as her treating orthopedic surgeon at this point.  We were able to aspirate fluid from her knee and surprisingly it showed a few white blood cells on the Gram stain and no organisms, but the cell count was 28,000 white blood cells.  Due to the cellulitis in her leg, I admitted her to the hospital after her presenting to the ER a day later with severe pain in her knee.  I have had her on vancomycin and Zosyn since the last a few days, hoping the cellulitis would improve and see if anything was improving with her knee.  Her white blood cell count peripherally stayed high and her sed rate and CRP also highly elevated.  Unfortunately, on vancomycin, her creatinine has jumped significantly.  I am going to stop those antibiotics today.  At this point, I have recommended excision arthroplasty of all the components from her knee.  She understands this fully as well as a thorough discussion of risks and benefits being had.  PROCEDURE DESCRIPTION:  After informed consent was obtained, appropriate left knee was marked.  She was brought to the operating room,  placed supine on the operating table.  General anesthesia was then obtained.  A nonsterile tourniquet was placed around her upper left thigh.  Her left leg was prepped and draped from the thigh down the ankle with DuraPrep and sterile drapes including the sterile stockinette.  A time-out was called and she was identified as correct patient, correct left knee. There was again a wide area of cellulitis along the anterior tibia and laterally.  Of note, she was having footdrop as well prior to this.  We then had the tourniquet elevated to 300 mmHg.  I went through her previous midline incision over the patella and carried this proximally and distally.  We found significant edema in the prepatellar soft tissue, probably went down to the knee joint itself and performed a medial  parapatellar arthrotomy and found an incredibly large fluid collection that appeared potentially purulent.  I sent cultures off for Gram stain from this as well as tissue from the knee.  Once we opened up the knee completely, we performed a complete synovectomy.  We then removed the femoral and tibial components decently easily.  We removed the patellar component as well.  Once we then performed a removal of all remaining cement from the knee, we irrigated the knee with 3 L normal saline solution after performing more of a synovectomy.  We then were able to mix cement and place our temporary cement antibiotic spacer to keep the space open.  We then closed the arthrotomy with interrupted Vicryl suture followed by 0 Vicryl in the deep tissue, 2-0 Vicryl in the subcutaneous tissue, interrupted staples on the skin.  Xeroform and well- padded sterile dressing were applied and the knee was placed in a knee immobilizer.  She was awakened and extubated and taken to the recovery room in stable condition.  All final counts were correct.  There were no complications noted.  Postoperatively, we will need her on 6 weeks of IV antibiotics with the hopes of being able to put her knee back in her at some point once she was able to fully clear infection.  Of note, Erskine Emery, PA-C, assisted in the entire case, his assistance was crucial for facilitating all aspects of this case.     Lind Guest. Ninfa Linden, M.D.     CYB/MEDQ  D:  11/14/2016  T:  11/14/2016  Job:  [id number]

## 2016-11-14 NOTE — Progress Notes (Signed)
Patient ID: Laura Chambers, female   DOB: [birth year], 76 y.o.   MRN: [id number] I'm certainly concerned that her cellulitis is still quite extensive and painful over her left leg.  Her knee is not as painful.  However, her peripheral WBC is still high.  Will obtain a MRI of the leg, but will likely proceed to surgery today late this afternoon to excise her left total knee.

## 2016-11-15 LAB — TYPE AND SCREEN
ABO/RH(D): O POS
ANTIBODY SCREEN: NEGATIVE
Unit division: 0
Unit division: 0

## 2016-11-15 LAB — BPAM RBC
Blood Product Expiration Date: 201805312359
Blood Product Expiration Date: 201805312359
ISSUE DATE / TIME: 201805111735
ISSUE DATE / TIME: 201805112153
UNIT TYPE AND RH: 5100
Unit Type and Rh: 5100

## 2016-11-15 LAB — BASIC METABOLIC PANEL
Anion gap: 9 (ref 5–15)
BUN: 44 mg/dL — AB (ref 6–20)
CO2: 24 mmol/L (ref 22–32)
Calcium: 7.9 mg/dL — ABNORMAL LOW (ref 8.9–10.3)
Chloride: 101 mmol/L (ref 101–111)
Creatinine, Ser: 1.98 mg/dL — ABNORMAL HIGH (ref 0.44–1.00)
GFR calc Af Amer: 27 mL/min — ABNORMAL LOW (ref >60)
GFR, EST NON AFRICAN AMERICAN: 23 mL/min — AB (ref >60)
Glucose, Bld: 132 mg/dL — ABNORMAL HIGH (ref 65–99)
POTASSIUM: 4.2 mmol/L (ref 3.5–5.1)
Sodium: 134 mmol/L — ABNORMAL LOW (ref 135–145)

## 2016-11-15 LAB — CBC
HCT: 34.5 % — ABNORMAL LOW (ref 36.0–46.0)
Hemoglobin: 11.2 g/dL — ABNORMAL LOW (ref 12.0–15.0)
MCH: 28.8 pg (ref 26.0–34.0)
MCHC: 32.5 g/dL (ref 30.0–36.0)
MCV: 88.7 fL (ref 78.0–100.0)
PLATELETS: 315 10*3/uL (ref 150–400)
RBC: 3.89 MIL/uL (ref 3.87–5.11)
RDW: 14.8 % (ref 11.5–15.5)
WBC: 14.7 10*3/uL — ABNORMAL HIGH (ref 4.0–10.5)

## 2016-11-15 LAB — BODY FLUID CULTURE
Gram Stain: NONE SEEN
ORGANISM ID, BACTERIA: NO GROWTH

## 2016-11-15 NOTE — Evaluation (Signed)
Occupational Therapy Evaluation Patient Details Name: Laura Chambers MRN: [id number] DOB: 06/02/1941 Today's Date: 11/15/2016    History of Present Illness Pt is s/p L antibiotic spacer; h/o TKA, THA, back sxs   Clinical Impression   This 76 year old female was admitted for the above. At baseline, she is independent with adls, and she works PT in a Theatre manager.  Evaluation limited by low BP. She will benefit from continued OT to increase safety and independence with adls, following TDWB.  Goals are for min A    Follow Up Recommendations  24/7 supervision.  Pt does not want to go to SNF   Equipment Recommendations  3 in 1 bedside commode    Recommendations for Other Services       Precautions / Restrictions Precautions Precautions: Fall;Knee Required Braces or Orthoses: Knee Immobilizer - Left Knee Immobilizer - Left: On at all times Restrictions Weight Bearing Restrictions: Yes LLE Weight Bearing: Touchdown weight bearing      Mobility Bed Mobility Overal bed mobility: Needs Assistance Bed Mobility: Supine to Sit;Sit to Supine     Supine to sit: +2 for physical assistance;Min assist;Mod assist Sit to supine: Total assist;+2 for physical assistance   General bed mobility comments: cues for sequence to sit EOB. Pt used rail. Able to use trapeze to pull up in bed.  Total +2 to get back to supine due to BP  Transfers                      Balance                                           ADL either performed or assessed with clinical judgement   ADL Overall ADL's : Needs assistance/impaired Eating/Feeding: Independent   Grooming: Set up;Bed level   Upper Body Bathing: Set up;Bed level   Lower Body Bathing: Total assistance;+2 for physical assistance;Bed level   Upper Body Dressing : Minimal assistance;Bed level   Lower Body Dressing: Total assistance;+2 for physical assistance;Bed level                 General ADL  Comments: pt was limited by BP this session. pt less talkative when sitting up.  Laid back down prior to BP registering.  When supine, BP was 84/40.  Pt unable to tolerate sitting ADLs at this time. RN notified of BP.     Vision         Perception     Praxis      Pertinent Vitals/Pain Pain Assessment: Faces Faces Pain Scale: Hurts little more Pain Descriptors / Indicators: Aching Pain Intervention(s): Limited activity within patient's tolerance;Monitored during session;Premedicated before session;Repositioned;Ice applied     Hand Dominance     Extremity/Trunk Assessment Upper Extremity Assessment Upper Extremity Assessment: Overall WFL for tasks assessed           Communication Communication Communication: No difficulties   Cognition Arousal/Alertness: Awake/alert Behavior During Therapy: WFL for tasks assessed/performed Overall Cognitive Status: Within Functional Limits for tasks assessed                                     General Comments       Exercises     Shoulder Instructions      Home  Living Family/patient expects to be discharged to:: Private residence Living Arrangements: Children;Alone Available Help at Discharge: Family (sister, niece and friends) Type of Home: House Home Access: Stairs to enter Technical brewer of Steps: 2 Entrance Stairs-Rails: None Home Layout: One level (doesn't use basement)     Bathroom Shower/Tub: Teacher, early years/pre: Handicapped height     Home Equipment: Environmental consultant - 2 wheels;Cane - single point;Shower seat   Additional Comments: has access to deceased mother's house with hardwood floors (her carpet is plush).  2 STE      Prior Functioning/Environment Level of Independence: Independent                 OT Problem List: Decreased activity tolerance;Cardiopulmonary status limiting activity;Decreased knowledge of use of DME or AE;Pain      OT Treatment/Interventions:  Self-care/ADL training;DME and/or AE instruction;Patient/family education    OT Goals(Current goals can be found in the care plan section) Acute Rehab OT Goals Patient Stated Goal: return to independence OT Goal Formulation: With patient Time For Goal Achievement: 11/22/16 Potential to Achieve Goals: Good ADL Goals Pt Will Perform Lower Body Bathing: with min assist;with adaptive equipment;sit to/from stand Pt Will Perform Lower Body Dressing: with min assist;sit to/from stand;with adaptive equipment Pt Will Transfer to Toilet: with min assist;stand pivot transfer;bedside commode;ambulating (vs) Pt Will Perform Toileting - Clothing Manipulation and hygiene: with min guard assist;sit to/from stand  OT Frequency: Min 2X/week   Barriers to D/C:            Co-evaluation PT/OT/SLP Co-Evaluation/Treatment: Yes Reason for Co-Treatment: For patient/therapist safety PT goals addressed during session: Mobility/safety with mobility OT goals addressed during session: ADL's and self-care      AM-PAC PT "6 Clicks" Daily Activity     Outcome Measure Help from another person eating meals?: None Help from another person taking care of personal grooming?: A Little Help from another person toileting, which includes using toliet, bedpan, or urinal?: A Lot Help from another person bathing (including washing, rinsing, drying)?: A Lot Help from another person to put on and taking off regular upper body clothing?: A Little Help from another person to put on and taking off regular lower body clothing?: Total 6 Click Score: 15   End of Session Nurse Communication: Other (comment) (BP low)  Activity Tolerance: Treatment limited secondary to medical complications (Comment) Patient left: in bed;with call bell/phone within reach  OT Visit Diagnosis: Pain Pain - Right/Left: Left Pain - part of body: Knee                Time: 8416-6063 OT Time Calculation (min): 27 min Charges:  OT General  Charges $OT Visit: 1 Procedure OT Evaluation $OT Eval Low Complexity: 1 Procedure G-Codes:     Laura Chambers, OTR/L [phone] 11/15/2016  Laura Chambers 11/15/2016, 10:55 AM

## 2016-11-15 NOTE — Progress Notes (Signed)
Subjective: 1 Day Post-Op Procedure(s) (LRB): EXCISIONAL LEFT TOTAL KNEE ARTHROPLASTY WITH  PLACEMENT OF ANTIBIOTIC SPACERS (Left) Patient reports pain as moderate.  Although she does feel better since her surgery.  Objective: Vital signs in last 24 hours: Temp:  [97.7 F (36.5 C)-99 F (37.2 C)] 98.2 F (36.8 C) (05/12 0615) Pulse Rate:  [65-104] 72 (05/12 0615) Resp:  [16-23] 16 (05/12 0615) BP: (96-151)/(42-108) 105/57 (05/12 0615) SpO2:  [96 %-100 %] 100 % (05/12 0615)  Intake/Output from previous day: 05/11 0701 - 05/12 0700 In: 5166.9 [P.O.:960; I.V.:3503.8; Blood:653.2; IV Piggyback:50] Out: 2500 [Urine:2400; Blood:100] Intake/Output this shift: Total I/O In: 240 [P.O.:240] Out: -    Recent Labs  11/13/16 0527 11/14/16 0505 11/15/16 0500  HGB 11.1* 9.7* 11.2*    Recent Labs  11/14/16 0505 11/15/16 0500  WBC 15.0* 14.7*  RBC 3.43* 3.89  HCT 30.8* 34.5*  PLT 307 315    Recent Labs  11/14/16 1247 11/15/16 0500  NA 132* 134*  K 3.8 4.2  CL 96* 101  CO2 24 24  BUN 48* 44*  CREATININE 2.56* 1.98*  GLUCOSE 99 132*  CALCIUM 8.2* 7.9*   No results for input(s): LABPT, INR in the last 72 hours.  Intact pulses distally Incision: dressing C/D/I Compartment soft  Assessment/Plan: 1 Day Post-Op Procedure(s) (LRB): EXCISIONAL LEFT TOTAL KNEE ARTHROPLASTY WITH  PLACEMENT OF ANTIBIOTIC SPACERS (Left) Up with therapy - TDWB only in knee immobilizer. May need short-term skilled nursing placement Has PICC line in; will need at least 6 weeks of IV antibiotics Culture suprisingly negative thus far  [name] 11/15/2016, 9:53 AM

## 2016-11-15 NOTE — Evaluation (Signed)
Physical Therapy Evaluation Patient Details Name: Laura Chambers MRN: [id number] DOB: Aug 08, 1940 Today's Date: 11/15/2016   History of Present Illness  Pt is s/p L antibiotic spacer; h/o TKA, THA, back sxs  Clinical Impression  Pt admitted as above and presenting with functional mobility limitations 2* decreased strength/ROM L LE, post op pain, and TDWB L LE.  Pt hopes to progress to dc home but may need to consider short term SNF level rehab dependent on acute stay progress and level of assist available at home.    Follow Up Recommendations Home health PT;SNF    Equipment Recommendations  Rolling walker with 5" wheels    Recommendations for Other Services OT consult     Precautions / Restrictions Precautions Precautions: Fall;Knee Required Braces or Orthoses: Knee Immobilizer - Left Knee Immobilizer - Left: On at all times Restrictions Weight Bearing Restrictions: Yes LLE Weight Bearing: Touchdown weight bearing      Mobility  Bed Mobility Overal bed mobility: Needs Assistance Bed Mobility: Supine to Sit;Sit to Supine     Supine to sit: +2 for physical assistance;Min assist;Mod assist Sit to supine: Total assist;+2 for physical assistance   General bed mobility comments: cues for sequence to sit EOB. Pt used rail. Able to use trapeze to pull up in bed.  Total +2 to get back to supine due to c/o dizziness with BP 84/40  Transfers                 General transfer comment: NT 2* increasing dizziness sitting at EOB  Ambulation/Gait                Stairs            Wheelchair Mobility    Modified Rankin (Stroke Patients Only)       Balance                                             Pertinent Vitals/Pain Pain Assessment: Faces Faces Pain Scale: Hurts little more Pain Location: L knee Pain Descriptors / Indicators: Aching Pain Intervention(s): Limited activity within patient's tolerance;Monitored during  session;Premedicated before session;Ice applied    Home Living Family/patient expects to be discharged to:: Private residence Living Arrangements: Children;Alone Available Help at Discharge: Family Type of Home: House Home Access: Stairs to enter Entrance Stairs-Rails: None Entrance Stairs-Number of Steps: 2 Home Layout: One level Home Equipment: Environmental consultant - 2 wheels;Cane - single point;Shower seat Additional Comments: has access to deceased mother's house with hardwood floors (her carpet is plush).  2 STE    Prior Function Level of Independence: Independent               Hand Dominance        Extremity/Trunk Assessment   Upper Extremity Assessment Upper Extremity Assessment: Overall WFL for tasks assessed    Lower Extremity Assessment Lower Extremity Assessment: LLE deficits/detail LLE Deficits / Details: KI in place, NO ROM       Communication   Communication: No difficulties  Cognition Arousal/Alertness: Awake/alert Behavior During Therapy: WFL for tasks assessed/performed Overall Cognitive Status: Within Functional Limits for tasks assessed                                        General Comments  Exercises     Assessment/Plan    PT Assessment Patient needs continued PT services  PT Problem List Decreased strength;Decreased range of motion;Decreased activity tolerance;Decreased mobility;Decreased knowledge of use of DME;Pain;Obesity       PT Treatment Interventions DME instruction;Gait training;Stair training;Functional mobility training;Therapeutic activities;Therapeutic exercise;Patient/family education    PT Goals (Current goals can be found in the Care Plan section)  Acute Rehab PT Goals Patient Stated Goal: return to independence PT Goal Formulation: With patient Time For Goal Achievement: 11/22/16 Potential to Achieve Goals: Fair    Frequency 7X/week   Barriers to discharge Decreased caregiver support Pt uncertain of  level of assist available at home    Co-evaluation PT/OT/SLP Co-Evaluation/Treatment: Yes Reason for Co-Treatment: For patient/therapist safety PT goals addressed during session: Mobility/safety with mobility OT goals addressed during session: ADL's and self-care       AM-PAC PT "6 Clicks" Daily Activity  Outcome Measure Difficulty turning over in bed (including adjusting bedclothes, sheets and blankets)?: A Lot Difficulty moving from lying on back to sitting on the side of the bed? : A Lot Difficulty sitting down on and standing up from a chair with arms (e.g., wheelchair, bedside commode, etc,.)?: A Lot Help needed moving to and from a bed to chair (including a wheelchair)?: A Lot Help needed walking in hospital room?: Total Help needed climbing 3-5 steps with a railing? : Total 6 Click Score: 10    End of Session Equipment Utilized During Treatment: Left knee immobilizer Activity Tolerance: Patient limited by fatigue;Other (comment) (orthostatic) Patient left: in bed;with call bell/phone within reach;with nursing/sitter in room Nurse Communication: Mobility status;Other (comment) (pt orthostatic) PT Visit Diagnosis: Difficulty in walking, not elsewhere classified (R26.2)    Time: 7782-4235 PT Time Calculation (min) (ACUTE ONLY): 39 min   Charges:   PT Evaluation $PT Eval Low Complexity: 1 Procedure PT Treatments $Therapeutic Activity: 8-22 mins   PT G Codes:        Pg [phone]    [name] 11/15/2016, 1:01 PM

## 2016-11-16 LAB — BASIC METABOLIC PANEL
Anion gap: 8 (ref 5–15)
BUN: 40 mg/dL — AB (ref 6–20)
CALCIUM: 8 mg/dL — AB (ref 8.9–10.3)
CHLORIDE: 102 mmol/L (ref 101–111)
CO2: 25 mmol/L (ref 22–32)
CREATININE: 1.51 mg/dL — AB (ref 0.44–1.00)
GFR calc non Af Amer: 32 mL/min — ABNORMAL LOW (ref >60)
GFR, EST AFRICAN AMERICAN: 38 mL/min — AB (ref >60)
GLUCOSE: 112 mg/dL — AB (ref 65–99)
Potassium: 4.1 mmol/L (ref 3.5–5.1)
Sodium: 135 mmol/L (ref 135–145)

## 2016-11-16 LAB — CBC
HCT: 33.6 % — ABNORMAL LOW (ref 36.0–46.0)
Hemoglobin: 10.8 g/dL — ABNORMAL LOW (ref 12.0–15.0)
MCH: 29 pg (ref 26.0–34.0)
MCHC: 32.1 g/dL (ref 30.0–36.0)
MCV: 90.1 fL (ref 78.0–100.0)
Platelets: 310 K/uL (ref 150–400)
RBC: 3.73 MIL/uL — ABNORMAL LOW (ref 3.87–5.11)
RDW: 15.1 % (ref 11.5–15.5)
WBC: 13.4 K/uL — ABNORMAL HIGH (ref 4.0–10.5)

## 2016-11-16 MED ORDER — CEFAZOLIN SODIUM-DEXTROSE 1-4 GM/50ML-% IV SOLN
1.0000 g | Freq: Three times a day (TID) | INTRAVENOUS | Status: DC
  Administered 2016-11-16 – 2016-11-17 (×4): 1 g via INTRAVENOUS
  Filled 2016-11-16 (×4): qty 50

## 2016-11-16 NOTE — Progress Notes (Signed)
Pain is well controlled today.  Dressings are c/d/I.  Progressing with PT with TDWB.  Cultures are NGTD x 1 day.  Continue ancef.

## 2016-11-16 NOTE — Progress Notes (Signed)
Physical Therapy Treatment Patient Details Name: Laura Chambers MRN: [id number] DOB: 1940-10-31 Today's Date: 11/16/2016    History of Present Illness Pt is s/p L antibiotic spacer; h/o TKA, THA, back sxs    PT Comments    Pt cooperative but with encouragement needed to attempt any mobility tasks.  Pt ltd by fatigue, pain and anxiety.   Follow Up Recommendations  SNF     Equipment Recommendations  None recommended by PT    Recommendations for Other Services OT consult     Precautions / Restrictions Precautions Precautions: Fall;Knee Required Braces or Orthoses: Knee Immobilizer - Left Knee Immobilizer - Left: On at all times Restrictions Weight Bearing Restrictions: Yes LLE Weight Bearing: Touchdown weight bearing    Mobility  Bed Mobility Overal bed mobility: Needs Assistance Bed Mobility: Supine to Sit;Sit to Supine     Supine to sit: +2 for physical assistance;Min assist;Mod assist Sit to supine: Mod assist;Max assist;+2 for physical assistance;+2 for safety/equipment   General bed mobility comments: Increased time with cues for sequence and use of R LE and UEs to self assist.  Pt utilizing bed rails and trapeze as well as requiring assist to manage L LE and to control trunk  Transfers                 General transfer comment: NT 2* pt c/o increasing pain and dizziness and increased anxiety.  Ambulation/Gait                 Stairs            Wheelchair Mobility    Modified Rankin (Stroke Patients Only)       Balance Overall balance assessment: Needs assistance Sitting-balance support: Feet supported;Single extremity supported Sitting balance-Leahy Scale: Fair                                      Cognition Arousal/Alertness: Awake/alert Behavior During Therapy: WFL for tasks assessed/performed Overall Cognitive Status: Within Functional Limits for tasks assessed                                         Exercises      General Comments        Pertinent Vitals/Pain Pain Assessment: 0-10 Pain Score: 8  Pain Location: L knee Pain Descriptors / Indicators: Aching Pain Intervention(s): Limited activity within patient's tolerance;Monitored during session;Premedicated before session;Ice applied    Home Living                      Prior Function            PT Goals (current goals can now be found in the care plan section) Acute Rehab PT Goals Patient Stated Goal: return to independence PT Goal Formulation: With patient Time For Goal Achievement: 11/22/16 Potential to Achieve Goals: Fair Progress towards PT goals: Progressing toward goals (very slowly)    Frequency    7X/week      PT Plan Discharge plan needs to be updated    Co-evaluation              AM-PAC PT "6 Clicks" Daily Activity  Outcome Measure  Difficulty turning over in bed (including adjusting bedclothes, sheets and blankets)?: A Lot Difficulty moving from lying on back to sitting  on the side of the bed? : A Lot Difficulty sitting down on and standing up from a chair with arms (e.g., wheelchair, bedside commode, etc,.)?: A Lot Help needed moving to and from a bed to chair (including a wheelchair)?: Total Help needed walking in hospital room?: Total Help needed climbing 3-5 steps with a railing? : Total 6 Click Score: 9    End of Session Equipment Utilized During Treatment: Left knee immobilizer Activity Tolerance: Patient limited by fatigue;Other (comment) Patient left: in bed;with call bell/phone within reach;with bed alarm set Nurse Communication: Mobility status PT Visit Diagnosis: Difficulty in walking, not elsewhere classified (R26.2)     Time: 2229-7989 PT Time Calculation (min) (ACUTE ONLY): 29 min  Charges:  $Therapeutic Activity: 23-37 mins                    G Codes:       Pg [phone]    Vertis Bauder 11/16/2016, 2:32 PM

## 2016-11-16 NOTE — Progress Notes (Signed)
PHARMACY NOTE:  ANTIMICROBIAL RENAL DOSAGE ADJUSTMENT  Current antimicrobial regimen includes a mismatch between antimicrobial dosage and estimated renal function.  As per policy approved by the Pharmacy & Therapeutics and Medical Executive Committees, the antimicrobial dosage will be adjusted accordingly.  Current antimicrobial dosage:  Ancef 1gm IV q12h  Indication: cellulitis  Renal Function:  Estimated Creatinine Clearance: 35.6 mL/min (A) (by C-G formula based on SCr of 1.51 mg/dL (H)). []      On intermittent HD, scheduled: []      On CRRT    Antimicrobial dosage has been changed to:  Ancef 1gm IV q8h  Additional comments:   Thank you for allowing pharmacy to be a part of this patient's care.   Dolly Rias RPh 11/16/2016, 10:13 AM Pager [phone]

## 2016-11-17 ENCOUNTER — Encounter (HOSPITAL_COMMUNITY): Payer: Self-pay | Admitting: Orthopaedic Surgery

## 2016-11-17 LAB — CBC
HCT: 34.9 % — ABNORMAL LOW (ref 36.0–46.0)
Hemoglobin: 11.1 g/dL — ABNORMAL LOW (ref 12.0–15.0)
MCH: 28.6 pg (ref 26.0–34.0)
MCHC: 31.8 g/dL (ref 30.0–36.0)
MCV: 89.9 fL (ref 78.0–100.0)
PLATELETS: 339 10*3/uL (ref 150–400)
RBC: 3.88 MIL/uL (ref 3.87–5.11)
RDW: 14.5 % (ref 11.5–15.5)
WBC: 12.4 10*3/uL — AB (ref 4.0–10.5)

## 2016-11-17 LAB — AEROBIC CULTURE  (SUPERFICIAL SPECIMEN): CULTURE: NO GROWTH

## 2016-11-17 LAB — CREATININE, SERUM
CREATININE: 1.13 mg/dL — AB (ref 0.44–1.00)
GFR, EST AFRICAN AMERICAN: 53 mL/min — AB (ref >60)
GFR, EST NON AFRICAN AMERICAN: 46 mL/min — AB (ref >60)

## 2016-11-17 MED ORDER — DEXTROSE 5 % IV SOLN
2.0000 g | Freq: Two times a day (BID) | INTRAVENOUS | Status: DC
  Administered 2016-11-17 – 2016-11-19 (×4): 2 g via INTRAVENOUS
  Filled 2016-11-17 (×5): qty 2

## 2016-11-17 NOTE — Progress Notes (Signed)
CSW met with pt this afternoon to provide SNF bed offers. Pt gave CSW permission to contact her son to review PT recommendations. VM has been left for son, Delbert Harness [phone]. Awaiting return call. CSW will continue to follow to assist with d/c planning needs.  Werner Lean LCSW [phone]

## 2016-11-17 NOTE — Progress Notes (Signed)
Occupational Therapy Treatment Patient Details Name: Laura Chambers MRN: [id number] DOB: [birth year] Today's Date: 11/17/2016    History of present illness Pt is s/p L antibiotic spacer; h/o TKA, THA, back sxs   OT comments  Pt very confused this day. Communicated with RN  Follow Up Recommendations  SNF    Equipment Recommendations  3 in 1 bedside commode    Recommendations for Other Services      Precautions / Restrictions Precautions Precautions: Fall;Knee Required Braces or Orthoses: Knee Immobilizer - Left Knee Immobilizer - Left: On at all times Restrictions Weight Bearing Restrictions: Yes LLE Weight Bearing: Touchdown weight bearing       Mobility Bed Mobility Overal bed mobility: Needs Assistance Bed Mobility: Supine to Sit;Sit to Supine     Supine to sit: +2 for physical assistance;Max assist;Total assist;HOB elevated Sit to supine: Max assist;Total assist;+2 for safety/equipment;+2 for physical assistance   General bed mobility comments: Increased time with cues for sequence and use of R LE and UEs to self assist.  Pt utilizing bed rails and trapeze as well as requiring assist to manage L LE and to control trunk; hand over hand assist/facilitation for pt to self assist  Transfers                 General transfer comment: NT 2* pt unable to maintain safe sitting without +2 assist.    Balance     Sitting balance-Leahy Scale: Zero Sitting balance - Comments: posterior LOB, difficulty processing cues to improve balance and maintain midline                                   ADL either performed or assessed with clinical judgement   ADL Overall ADL's : Needs assistance/impaired Eating/Feeding: Moderate assistance;Bed level   Grooming: Moderate assistance;Bed level                                       Vision       Perception     Praxis      Cognition Arousal/Alertness: Awake/alert   Overall Cognitive  Status: Impaired/Different from baseline Area of Impairment: Orientation;Following commands;Attention;Problem solving                 Orientation Level: Disoriented to;Time Current Attention Level: Focused   Following Commands: Follows one step commands inconsistently     Problem Solving: Slow processing;Decreased initiation;Difficulty sequencing;Requires verbal cues;Requires tactile cues General Comments: pt states she is at [facility] and that it is January; she has difficulty making full sentences, speech is slurred and nonsensical        Exercises     Shoulder Instructions       General Comments c/o feeling that she would "fall forward" in sitting , mild dizziness reported; VS after returned to bed--> BP 118/47, O2 sats 94% on 2L, HR 77    Pertinent Vitals/ Pain       Pain Assessment: Faces Faces Pain Scale: Hurts even more Pain Location: L knee Pain Descriptors / Indicators: Grimacing;Guarding Pain Intervention(s): Monitored during session;Repositioned;Limited activity within patient's tolerance  Home Living  Prior Functioning/Environment              Frequency  Min 2X/week        Progress Toward Goals  OT Goals(current goals can now be found in the care plan section)  Progress towards OT goals: OT to reassess next treatment  Acute Rehab OT Goals Patient Stated Goal: unable to state  Plan Discharge plan needs to be updated    Co-evaluation    PT/OT/SLP Co-Evaluation/Treatment: Yes Reason for Co-Treatment: For patient/therapist safety PT goals addressed during session: Mobility/safety with mobility OT goals addressed during session: ADL's and self-care      AM-PAC PT "6 Clicks" Daily Activity     Outcome Measure   Help from another person eating meals?: A Lot Help from another person taking care of personal grooming?: A Lot Help from another person toileting, which includes using toliet,  bedpan, or urinal?: Total Help from another person bathing (including washing, rinsing, drying)?: Total Help from another person to put on and taking off regular upper body clothing?: Total Help from another person to put on and taking off regular lower body clothing?: Total 6 Click Score: 8    End of Session    OT Visit Diagnosis: Pain;Muscle weakness (generalized) (M62.81);Unsteadiness on feet (R26.81);Other symptoms and signs involving cognitive function Pain - Right/Left: Left Pain - part of body: Knee   Activity Tolerance Patient limited by lethargy;Patient limited by fatigue   Patient Left in bed;with call bell/phone within reach;with chair alarm set   Nurse Communication Mobility status;Other (comment) (confusion)        Time: 1009-1030 OT Time Calculation (min): 21 min  Charges: OT General Charges $OT Visit: 1 Procedure OT Treatments $Self Care/Home Management : 8-22 mins  [place], Laura Chambers   Laura Chambers 11/17/2016, 11:48 AM

## 2016-11-17 NOTE — Clinical Social Work Note (Signed)
Clinical Social Work Assessment  Patient Details  Name: Laura Chambers MRN: [id number] Date of Birth: 1941/03/30  Date of referral:  11/17/16               Reason for consult:  Discharge Planning, Facility Placement                Permission sought to share information with:  Chartered certified accountant granted to share information::  Yes, Verbal Permission Granted  Name::        Agency::     Relationship::     Contact Information:     Housing/Transportation Living arrangements for the past 2 months:  Single Family Home Source of Information:  Patient Patient Interpreter Needed:  None Criminal Activity/Legal Involvement Pertinent to Current Situation/Hospitalization:  No - Comment as needed Significant Relationships:  Adult Children, Siblings Lives with:  Self Do you feel safe going back to the place where you live?   (" Not sure ") Need for family participation in patient care:  Yes (Comment)  Care giving concerns:  Pt may need more assistance at home than is available at d/c.    Social Worker assessment / plan:  Pt hospitalized on 11/11/16 from home with left leg cellulitis. CSW has been consulted to assist with d/c planning. CSW met with pt to review PT recommendations. Pt was hoping to return home at d/c but doesn't think her son will be available to offer assistance. Pt gave CSW permission to initiate SNF search. Bed offers are pending. CSW will continue to follow to assist with d/c planning.  Employment status:  Retired Nurse, adult PT Recommendations:  [place] / Referral to community resources:  [place]  Patient/Family's Response to care: Disposition to be determined. Most likely SNF but pt wants to speak with family before accepting plan.  Patient/Family's Understanding of and Emotional Response to Diagnosis, Current Treatment, and Prognosis:  Pt complains that she doesn't feel  well. " I feel confused". NSG aware.  Support/reassurance provided.   Emotional Assessment Appearance:  Appears stated age Attitude/Demeanor/Rapport:  Other (cooperative) Affect (typically observed):  Anxious Orientation:  Oriented to Self, Oriented to Place, Oriented to Situation Alcohol / Substance use:  Not Applicable Psych involvement (Current and /or in the community):  No (Comment)  Discharge Needs  Concerns to be addressed:  Discharge Planning Concerns Readmission within the last 30 days:  No Current discharge risk:  None Barriers to Discharge:  No Barriers Identified   Luretha Rued, [place] 11/17/2016, 12:08 PM

## 2016-11-17 NOTE — NC FL2 (Signed)
[place] LEVEL OF CARE SCREENING TOOL     IDENTIFICATION  Patient Name: Laura Chambers Birthdate: 1941-02-21 Sex: female Admission Date (Current Location): 11/11/2016  [facility] and [place] Number:  Herbalist and Address:  [facility],  [place], [place]      Provider Number: [id number]  Attending Physician Name and Address:  [name]  Relative Name and Phone Number:       Current Level of Care: Hospital Recommended Level of Care: [place] Prior Approval Number:    Date Approved/Denied:   PASRR Number: [id number]  Discharge Plan: SNF    Current Diagnoses: Patient Active Problem List   Diagnosis Date Noted  . Cellulitis of left lower leg 11/12/2016  . Knee pain, left 11/11/2016  . Left leg swelling 11/10/2016  . Pain in left leg 11/10/2016  . Presence of left artificial knee joint 11/10/2016  . Trigeminal neuralgia 08/19/2015  . Musculoskeletal neck pain 08/19/2015  . Peripheral neuropathy 01/12/2014  . Hyperglycemia 06/16/2012  . 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 Dyslipidemia 06/16/2012  . Globus sensation 02/04/2012  . Nausea 02/04/2012  . Bowel habit changes 02/04/2012  . Pyrosis 02/04/2012    Orientation RESPIRATION BLADDER Height & Weight     Place, Self, Situation  Normal Continent Weight: 189 lb (85.7 kg) Height:  5\' 7"  (170.2 cm)  BEHAVIORAL SYMPTOMS/MOOD NEUROLOGICAL BOWEL NUTRITION STATUS  Other (Comment) (no behaviors)   Continent Diet  AMBULATORY STATUS COMMUNICATION OF NEEDS Skin   Extensive Assist Verbally Surgical wounds                       Personal Care Assistance Level of Assistance  Bathing, Feeding, Dressing Bathing Assistance: Maximum assistance   Dressing Assistance: Maximum assistance     Functional Limitations Info  Sight, Hearing, Speech Sight Info: Adequate Hearing Info: Adequate Speech Info:  Adequate    SPECIAL CARE FACTORS FREQUENCY  PT (By licensed PT), OT (By licensed OT)     PT Frequency: 5x wk OT Frequency: 5x wk            Contractures Contractures Info: Not present    Additional Factors Info  Code Status Code Status Info: Full Code             Current Medications (11/17/2016):  This is the current hospital active medication list Current Facility-Administered Medications  Medication Dose Route Frequency Provider Last Rate Last Dose  . 0.9 %  sodium chloride infusion   Intravenous Continuous [name], MD 75 mL/hr at 11/16/16 0420    . 0.9 %  sodium chloride infusion   Intravenous Once [name], MD      . acetaminophen (TYLENOL) tablet 650 mg  650 mg Oral Q6H PRN [name], MD       Or  . acetaminophen (TYLENOL) suppository 650 mg  650 mg Rectal Q6H PRN [name], MD      . aspirin tablet 325 mg  325 mg Oral BID PC [name], MD   325 mg at 11/17/16 0913  . atenolol (TENORMIN) tablet 25 mg  25 mg Oral Daily Cardama, Grayce Sessions, MD   25 mg at 11/17/16 0913  . ceFAZolin (ANCEF) IVPB 1 g/50 mL premix  1 g Intravenous Q8H [name], MD   Stopped at 11/17/16 [phone]  . diphenhydrAMINE (BENADRYL) 12.5 MG/5ML elixir 12.5-25 mg  12.5-25 mg Oral Q4H PRN [name], MD      . folic acid (FOLVITE) tablet 0.5 mg  500 mcg Oral Daily [name], MD   0.5 mg at 11/17/16 0913  . gabapentin (NEURONTIN) capsule 600 mg  600 mg Oral TID Fatima Blank, MD   600 mg at 11/17/16 0919  . hydrochlorothiazide (MICROZIDE) capsule 12.5 mg  12.5 mg Oral Daily Leandrew Koyanagi, MD   Stopped at 11/16/16 1313  . HYDROmorphone (DILAUDID) injection 1 mg  1 mg Intravenous Q2H PRN [name], MD   1 mg at 11/17/16 0912  . LORazepam (ATIVAN) tablet 1 mg  1 mg Oral Q6H PRN [name], MD   1 mg at 11/17/16 0914  . methocarbamol (ROBAXIN) tablet 500 mg  500  mg Oral Q6H PRN [name], MD   500 mg at 11/15/16 0447   Or  . methocarbamol (ROBAXIN) 500 mg in dextrose 5 % 50 mL IVPB  500 mg Intravenous Q6H PRN [name], MD      . multivitamin with minerals tablet 1 tablet  1 tablet Oral Beverly Gust [name], MD   1 tablet at 11/16/16 [phone]  . ondansetron (ZOFRAN) injection 4 mg  4 mg Intravenous Q6H PRN [name], MD   4 mg at 11/12/16 1218  . oxyCODONE (Oxy IR/ROXICODONE) immediate release tablet 5-10 mg  5-10 mg Oral Q3H PRN [name], MD   10 mg at 11/17/16 0358  . rosuvastatin (CRESTOR) tablet 10 mg  10 mg Oral Daily Cardama, Grayce Sessions, MD   10 mg at 11/17/16 0914  . sodium chloride flush (NS) 0.9 % injection 10-40 mL  10-40 mL Intracatheter PRN [name], MD   10 mL at 11/17/16 1761     Discharge Medications: Please see discharge summary for a list of discharge medications.  Relevant Imaging Results:  Relevant Lab Results:   Additional Information SS # [id number]. Pt will need IV antibiotics at d/c. ( medication to be determined ).  Stephenia Vogan, [place] An, LCSW

## 2016-11-17 NOTE — Progress Notes (Signed)
reviewed

## 2016-11-17 NOTE — Progress Notes (Signed)
Patients co worker called stating that patient seems confused. Nurse entered patients room, patient upset and very anxious stating that she feels that she is confused and has been laying in bed all night yelling out in pain. Patient states that she needs her family here with here right now. Nurse has tryed to contact son but only able to reach his voicemail. Nurse contacted ex husband Shanon Brow per patients request and Shanon Brow states that he has tried to contact son Leroy Sea with no success and has left voicemail and will try to contact Vaughan Basta (patients sister). Pain medication has been administered and patient is resting quietly right now.

## 2016-11-17 NOTE — Progress Notes (Signed)
Patient c/o being unable to void bladder scan 362cc's. I&O cath with 700 cc's of amber colored urine.

## 2016-11-17 NOTE — Progress Notes (Signed)
Subjective: 3 Days Post-Op Procedure(s) (LRB): EXCISIONAL LEFT TOTAL KNEE ARTHROPLASTY WITH  PLACEMENT OF ANTIBIOTIC SPACERS (Left) Patient reports pain as moderate.  Creatinine improved - likely had been up due to Vancomycin.  On ancef now.  [name] says she does not feel good however.  Objective: Vital signs in last 24 hours: Temp:  [97.8 F (36.6 C)-98.8 F (37.1 C)] 98.5 F (36.9 C) (05/14 0545) Pulse Rate:  [81-94] 94 (05/14 0545) Resp:  [16] 16 (05/14 0545) BP: (111-137)/(43-50) 137/43 (05/14 0545) SpO2:  [87 %-99 %] 93 % (05/14 0545)  Intake/Output from previous day: 05/13 0701 - 05/14 0700 In: 3203.8 [P.O.:480; I.V.:2573.8; IV Piggyback:150] Out: 2550 [Urine:2550] Intake/Output this shift: No intake/output data recorded.   Recent Labs  11/15/16 0500 11/16/16 0507  HGB 11.2* 10.8*    Recent Labs  11/15/16 0500 11/16/16 0507  WBC 14.7* 13.4*  RBC 3.89 3.73*  HCT 34.5* 33.6*  PLT 315 310    Recent Labs  11/15/16 0500 11/16/16 0507 11/17/16 0449  NA 134* 135  --   K 4.2 4.1  --   CL 101 102  --   CO2 24 25  --   BUN 44* 40*  --   CREATININE 1.98* 1.51* 1.13*  GLUCOSE 132* 112*  --   CALCIUM 7.9* 8.0*  --    No results for input(s): LABPT, INR in the last 72 hours.  Intact pulses distally Incision: no drainage No cellulitis present Compartment soft  Still some slight edema and redness at distal anterior shin.  Assessment/Plan: 3 Days Post-Op Procedure(s) (LRB): EXCISIONAL LEFT TOTAL KNEE ARTHROPLASTY WITH  PLACEMENT OF ANTIBIOTIC SPACERS (Left) Up with therapy  TDWB only left leg. Check CBC this am to make sure continues to come down. Continue IV Ancef and decide what IV antibiotics to discharge her on  Laura Chambers 11/17/2016, 7:32 AM

## 2016-11-17 NOTE — Progress Notes (Signed)
Physical Therapy Treatment Patient Details Name: Laura Chambers MRN: [id number] DOB: [birth year] Today's Date: 11/17/2016    History of Present Illness Pt is s/p L antibiotic spacer; h/o TKA, THA, back sxs    PT Comments    Pt not progressing d/t AMS;  She has difficulty following any one step functional commands, requires +2 total assist for bed mobility and is unable to transfer to chair d/t poor sitting balance; pt is with slurred and nonsensical speech; RN made aware; will follow  Follow Up Recommendations  SNF     Equipment Recommendations  None recommended by PT    Recommendations for Other Services       Precautions / Restrictions Precautions Precautions: Fall;Knee Required Braces or Orthoses: Knee Immobilizer - Left Knee Immobilizer - Left: On at all times Restrictions Weight Bearing Restrictions: Yes LLE Weight Bearing: Touchdown weight bearing    Mobility  Bed Mobility Overal bed mobility: Needs Assistance Bed Mobility: Supine to Sit;Sit to Supine     Supine to sit: +2 for physical assistance;Max assist;Total assist;HOB elevated Sit to supine: Max assist;Total assist;+2 for safety/equipment;+2 for physical assistance   General bed mobility comments: Increased time with cues for sequence and use of R LE and UEs to self assist.  Pt utilizing bed rails and trapeze as well as requiring assist to manage L LE and to control trunk; hand over hand assist/facilitation for pt to self assist  Transfers                 General transfer comment: NT 2* pt unable to maintain safe sitting without +2 assist.  Ambulation/Gait                 Stairs            Wheelchair Mobility    Modified Rankin (Stroke Patients Only)       Balance     Sitting balance-Leahy Scale: Zero Sitting balance - Comments: posterior LOB, difficulty processing cues to improve balance and maintain midline                                    Cognition  Arousal/Alertness: Awake/alert   Overall Cognitive Status: Impaired/Different from baseline Area of Impairment: Orientation;Following commands;Attention;Problem solving                 Orientation Level: Disoriented to;Time Current Attention Level: Focused   Following Commands: Follows one step commands inconsistently     Problem Solving: Slow processing;Decreased initiation;Difficulty sequencing;Requires verbal cues;Requires tactile cues General Comments: pt states she is at [facility] and that it is January; she has difficulty making full sentences, speech is slurred and nonsensical      Exercises      General Comments General comments (skin integrity, edema, etc.): c/o feeling that she would "fall forward" in sitting , mild dizziness reported; VS after returned to bed--> BP 118/47, O2 sats 94% on 2L, HR 77      Pertinent Vitals/Pain Pain Assessment: Faces Faces Pain Scale: Hurts even more Pain Location: L knee Pain Descriptors / Indicators: Grimacing;Guarding Pain Intervention(s): Limited activity within patient's tolerance;Monitored during session    Home Living                      Prior Function            PT Goals (current goals can now be found in the  care plan section) Acute Rehab PT Goals Patient Stated Goal: unable to state PT Goal Formulation: With patient Time For Goal Achievement: 11/22/16 Potential to Achieve Goals: Fair Progress towards PT goals: Not progressing toward goals - comment (confusion)    Frequency    7X/week      PT Plan Current plan remains appropriate    Co-evaluation PT/OT/SLP Co-Evaluation/Treatment: Yes Reason for Co-Treatment: For patient/therapist safety PT goals addressed during session: Mobility/safety with mobility        AM-PAC PT "6 Clicks" Daily Activity  Outcome Measure  Difficulty turning over in bed (including adjusting bedclothes, sheets and blankets)?: Total Difficulty moving from lying on back to  sitting on the side of the bed? : Total Difficulty sitting down on and standing up from a chair with arms (e.g., wheelchair, bedside commode, etc,.)?: Total Help needed moving to and from a bed to chair (including a wheelchair)?: Total Help needed walking in hospital room?: Total Help needed climbing 3-5 steps with a railing? : Total 6 Click Score: 6    End of Session Equipment Utilized During Treatment: Left knee immobilizer Activity Tolerance: Other (comment) (limited by confusion) Patient left: in bed;with call bell/phone within reach;with bed alarm set Nurse Communication: Mobility status;Other (comment) (mental status) PT Visit Diagnosis: Difficulty in walking, not elsewhere classified (R26.2)     Time: 1009-1030 PT Time Calculation (min) (ACUTE ONLY): 21 min  Charges:  $Therapeutic Activity: 8-22 mins                    G Codes:          Wadie Mattie Dec 02, 2016, 10:48 AM

## 2016-11-17 NOTE — Clinical Social Work Placement (Signed)
   CLINICAL SOCIAL WORK PLACEMENT  NOTE  Date:  11/17/2016  Patient Details  Name: Laura Chambers MRN: [id number] Date of Birth: 1941-05-28  Clinical Social Work is seeking post-discharge placement for this patient at the [place] level of care (*CSW will initial, date and re-position this form in  chart as items are completed):  Yes   Patient/family provided with [place] Work Department's list of facilities offering this level of care within the geographic area requested by the patient (or if unable, by the patient's family).  Yes   Patient/family informed of their freedom to choose among providers that offer the needed level of care, that participate in Medicare, Medicaid or managed care program needed by the patient, have an available bed and are willing to accept the patient.  Yes   Patient/family informed of [place]'s ownership interest in [facility] and [facility], as well as of the fact that they are under no obligation to receive care at these facilities.  PASRR submitted to EDS on 11/17/16     PASRR number received on 11/17/16     Existing PASRR number confirmed on       FL2 transmitted to all facilities in geographic area requested by pt/family on 11/17/16     FL2 transmitted to all facilities within larger geographic area on       Patient informed that his/her managed care company has contracts with or will negotiate with certain facilities, including the following:            Patient/family informed of bed offers received.  Patient chooses bed at       Physician recommends and patient chooses bed at      Patient to be transferred to   on  .  Patient to be transferred to facility by       Patient family notified on   of transfer.  Name of family member notified:        PHYSICIAN       Additional Comment:    _______________________________________________ Luretha Rued, [place] 11/17/2016,  1:56 PM

## 2016-11-18 ENCOUNTER — Encounter (HOSPITAL_COMMUNITY): Payer: Self-pay | Admitting: Radiology

## 2016-11-18 ENCOUNTER — Inpatient Hospital Stay (HOSPITAL_COMMUNITY): Payer: PPO

## 2016-11-18 LAB — BASIC METABOLIC PANEL
Anion gap: 7 (ref 5–15)
BUN: 20 mg/dL (ref 6–20)
CO2: 28 mmol/L (ref 22–32)
Calcium: 8.2 mg/dL — ABNORMAL LOW (ref 8.9–10.3)
Chloride: 102 mmol/L (ref 101–111)
Creatinine, Ser: 0.85 mg/dL (ref 0.44–1.00)
GFR calc Af Amer: >60 mL/min (ref >60)
GFR calc non Af Amer: >60 mL/min (ref >60)
GLUCOSE: 114 mg/dL — AB (ref 65–99)
Potassium: 3.8 mmol/L (ref 3.5–5.1)
Sodium: 137 mmol/L (ref 135–145)

## 2016-11-18 LAB — CBC
HEMATOCRIT: 35 % — AB (ref 36.0–46.0)
Hemoglobin: 11 g/dL — ABNORMAL LOW (ref 12.0–15.0)
MCH: 28.2 pg (ref 26.0–34.0)
MCHC: 31.4 g/dL (ref 30.0–36.0)
MCV: 89.7 fL (ref 78.0–100.0)
Platelets: 330 10*3/uL (ref 150–400)
RBC: 3.9 MIL/uL (ref 3.87–5.11)
RDW: 14.5 % (ref 11.5–15.5)
WBC: 12.3 10*3/uL — ABNORMAL HIGH (ref 4.0–10.5)

## 2016-11-18 LAB — C-REACTIVE PROTEIN: CRP: 13.2 mg/dL — ABNORMAL HIGH (ref <1.0)

## 2016-11-18 LAB — SEDIMENTATION RATE: SED RATE: 82 mm/h — AB (ref 0–22)

## 2016-11-18 MED ORDER — POLYETHYLENE GLYCOL 3350 17 G PO PACK
17.0000 g | PACK | Freq: Every day | ORAL | Status: DC | PRN
  Filled 2016-11-18: qty 1

## 2016-11-18 MED ORDER — TAMSULOSIN HCL 0.4 MG PO CAPS
0.4000 mg | ORAL_CAPSULE | Freq: Every day | ORAL | Status: DC
  Administered 2016-11-18 – 2016-11-19 (×2): 0.4 mg via ORAL
  Filled 2016-11-18 (×2): qty 1

## 2016-11-18 MED ORDER — FUROSEMIDE 10 MG/ML IJ SOLN
40.0000 mg | Freq: Once | INTRAMUSCULAR | Status: AC
  Administered 2016-11-18: 18:00:00 40 mg via INTRAVENOUS
  Filled 2016-11-18: qty 4

## 2016-11-18 MED ORDER — DOCUSATE SODIUM 100 MG PO CAPS
100.0000 mg | ORAL_CAPSULE | Freq: Two times a day (BID) | ORAL | Status: DC
  Administered 2016-11-18 – 2016-11-19 (×3): 100 mg via ORAL
  Filled 2016-11-18 (×3): qty 1

## 2016-11-18 MED ORDER — IOPAMIDOL (ISOVUE-370) INJECTION 76%
INTRAVENOUS | Status: AC
  Administered 2016-11-18: 17:00:00 100 mL
  Filled 2016-11-18: qty 100

## 2016-11-18 MED ORDER — BISACODYL 10 MG RE SUPP
10.0000 mg | Freq: Every day | RECTAL | Status: DC | PRN
  Filled 2016-11-18: qty 1

## 2016-11-18 MED ORDER — TRAMADOL HCL 50 MG PO TABS
100.0000 mg | ORAL_TABLET | Freq: Four times a day (QID) | ORAL | Status: DC | PRN
  Administered 2016-11-18 – 2016-11-19 (×3): 100 mg via ORAL
  Filled 2016-11-18 (×3): qty 2

## 2016-11-18 MED ORDER — HYDROCODONE-ACETAMINOPHEN 5-325 MG PO TABS: 1.0000 | ORAL_TABLET | Freq: Four times a day (QID) | ORAL | Status: DC | PRN

## 2016-11-18 MED ORDER — PANTOPRAZOLE SODIUM 40 MG PO TBEC
40.0000 mg | DELAYED_RELEASE_TABLET | Freq: Every day | ORAL | Status: DC
  Administered 2016-11-18 – 2016-11-19 (×2): 40 mg via ORAL
  Filled 2016-11-18 (×2): qty 1

## 2016-11-18 MED ORDER — RIVAROXABAN 10 MG PO TABS
10.0000 mg | ORAL_TABLET | Freq: Every day | ORAL | Status: DC
  Administered 2016-11-18 – 2016-11-19 (×2): 10 mg via ORAL
  Filled 2016-11-18 (×2): qty 1

## 2016-11-18 NOTE — Care Management Important Message (Signed)
Important Message  Patient Details  Name: YANEL DOMBROSKY MRN: [id number] Date of Birth: [birth year]   Medicare Important Message Given:  Yes    Kerin Salen 11/18/2016, 11:04 AMImportant Message  Patient Details  Name: AUTRY DROEGE MRN: [id number] Date of Birth: 1940/11/08   Medicare Important Message Given:  Yes    Kerin Salen 11/18/2016, 11:03 AM

## 2016-11-18 NOTE — Progress Notes (Signed)
Patient 56% on [facility] when checking vitals. Patient arousable, but sleepy. Patient denies trouble breathing. Patient states "she was been coughing lots of phlem today." Patient placed on non-rebreather and sats above 98%, pulse ox also placed. Respiratory notified, Dr. Ninfa Linden paged.

## 2016-11-18 NOTE — Progress Notes (Signed)
CSW following to assist with d/c planning. SNF bed offers provided to pt / son. They have chosen [facility]. CSW will continue to assist with d/c planning to SNF once pt is stable for d/c.  Werner Lean LCSW [phone]

## 2016-11-18 NOTE — Progress Notes (Signed)
Physical Therapy Treatment Patient Details Name: Laura Chambers MRN: [id number] DOB: 1941-01-19 Today's Date: 11/18/2016    History of Present Illness Pt is s/p L antibiotic spacer; h/o TKA, THA, back sxs    PT Comments    Pt with slow progress, unable to df L ankle, RN made aware; nausea and pain limiting mobility today; will continue current POC  Follow Up Recommendations  SNF     Equipment Recommendations  None recommended by PT    Recommendations for Other Services       Precautions / Restrictions Precautions Precautions: Fall;Knee Restrictions LLE Weight Bearing: Touchdown weight bearing    Mobility  Bed Mobility Overal bed mobility: Needs Assistance Bed Mobility: Supine to Sit;Sit to Supine     Supine to sit: HOB elevated;Mod assist Sit to supine: +2 for physical assistance;+2 for safety/equipment;Max assist;Total assist   General bed mobility comments: Increased time with cues for sequence and use of R LE and UEs to self assist., heavy use of rail  Transfers                    Ambulation/Gait                 Stairs            Wheelchair Mobility    Modified Rankin (Stroke Patients Only)       Balance Overall balance assessment: Needs assistance Sitting-balance support: Feet supported;Single extremity supported Sitting balance-Leahy Scale: Fair Sitting balance - Comments: posterior LOB, difficulty processing cues to improve balance and maintain midline                                    Cognition Arousal/Alertness: Awake/alert Behavior During Therapy: WFL for tasks assessed/performed Overall Cognitive Status: Impaired/Different from baseline                         Following Commands: Follows one step commands with increased time              Exercises      General Comments General comments (skin integrity, edema, etc.): LLE edematous; unable to df L ankle, Rn made aware       Pertinent Vitals/Pain Pain Assessment: Faces Faces Pain Scale: Hurts even more Pain Location: L knee Pain Descriptors / Indicators: Grimacing;Guarding Pain Intervention(s): Limited activity within patient's tolerance;Monitored during session;Repositioned    Home Living                      Prior Function            PT Goals (current goals can now be found in the care plan section) Acute Rehab PT Goals PT Goal Formulation: With patient Time For Goal Achievement: 11/22/16 Potential to Achieve Goals: Fair    Frequency    7X/week      PT Plan Current plan remains appropriate    Co-evaluation              AM-PAC PT "6 Clicks" Daily Activity  Outcome Measure  Difficulty turning over in bed (including adjusting bedclothes, sheets and blankets)?: A Lot Difficulty moving from lying on back to sitting on the side of the bed? : A Lot Difficulty sitting down on and standing up from a chair with arms (e.g., wheelchair, bedside commode, etc,.)?: A Lot Help needed moving to and from a bed  to chair (including a wheelchair)?: Total Help needed walking in hospital room?: Total Help needed climbing 3-5 steps with a railing? : Total 6 Click Score: 9    End of Session Equipment Utilized During Treatment: Left knee immobilizer Activity Tolerance: Other (comment) (nausea and pain) Patient left: in bed;with call bell/phone within reach;with family/visitor present   PT Visit Diagnosis: Difficulty in walking, not elsewhere classified (R26.2);Muscle weakness (generalized) (M62.81)     Time: 1028-1050 PT Time Calculation (min) (ACUTE ONLY): 22 min  Charges:  $Therapeutic Activity: 8-22 mins                    G Codes:          Laura Chambers December 05, 2016, 12:26 PM

## 2016-11-18 NOTE — Progress Notes (Signed)
Plan for d/c to SNF, discharge planning per CSW. [phone] 

## 2016-11-18 NOTE — Progress Notes (Signed)
Subjective: 4 Days Post-Op Procedure(s) (LRB): EXCISIONAL LEFT TOTAL KNEE ARTHROPLASTY WITH  PLACEMENT OF ANTIBIOTIC SPACERS (Left) Patient reports pain as moderate.  Has had urinary retention. Creatinine back to normal.  Switched to Cefepime IV.  Objective: Vital signs in last 24 hours: Temp:  [97.8 F (36.6 C)-99.1 F (37.3 C)] 99.1 F (37.3 C) (05/15 0628) Pulse Rate:  [73-85] 85 (05/15 0628) Resp:  [16] 16 (05/15 0628) BP: (105-156)/(39-59) 156/59 (05/15 0628) SpO2:  [92 %-99 %] 92 % (05/15 0628)  Intake/Output from previous day: 05/14 0701 - 05/15 0700 In: 760 [P.O.:480; I.V.:230; IV Piggyback:50] Out: 1400 [Urine:1400] Intake/Output this shift: No intake/output data recorded.   Recent Labs  11/16/16 0507 11/17/16 0817 11/18/16 0506  HGB 10.8* 11.1* 11.0*    Recent Labs  11/17/16 0817 11/18/16 0506  WBC 12.4* 12.3*  RBC 3.88 3.90  HCT 34.9* 35.0*  PLT 339 330    Recent Labs  11/16/16 0507 11/17/16 0449 11/18/16 0506  NA 135  --  137  K 4.1  --  3.8  CL 102  --  102  CO2 25  --  28  BUN 40*  --  20  CREATININE 1.51* 1.13* 0.85  GLUCOSE 112*  --  114*  CALCIUM 8.0*  --  8.2*   No results for input(s): LABPT, INR in the last 72 hours.  Intact pulses distally Incision: scant drainage Compartment soft  Cellulitis almost resolved.  Assessment/Plan: 4 Days Post-Op Procedure(s) (LRB): EXCISIONAL LEFT TOTAL KNEE ARTHROPLASTY WITH  PLACEMENT OF ANTIBIOTIC SPACERS (Left) Up with therapy Discharge to SNF next 1-2 days. Bowel program. Insert foley.  Start Xarelto.  [name] 11/18/2016, 7:15 AM

## 2016-11-19 ENCOUNTER — Inpatient Hospital Stay
Admission: RE | Admit: 2016-11-19 | Discharge: 2016-12-15 | Disposition: A | Discharge status: Other Acute Inpatient Hospital | Payer: PPO | Source: Ambulatory Visit | Attending: Internal Medicine | Admitting: Internal Medicine

## 2016-11-19 DIAGNOSIS — R279 Unspecified lack of coordination: Secondary | ICD-10-CM | POA: Diagnosis not present

## 2016-11-19 DIAGNOSIS — S8991XA Unspecified injury of right lower leg, initial encounter: Secondary | ICD-10-CM | POA: Diagnosis not present

## 2016-11-19 DIAGNOSIS — L03116 Cellulitis of left lower limb: Secondary | ICD-10-CM | POA: Diagnosis not present

## 2016-11-19 DIAGNOSIS — N1330 Unspecified hydronephrosis: Secondary | ICD-10-CM | POA: Diagnosis not present

## 2016-11-19 DIAGNOSIS — I119 Hypertensive heart disease without heart failure: Secondary | ICD-10-CM | POA: Diagnosis not present

## 2016-11-19 DIAGNOSIS — M542 Cervicalgia: Secondary | ICD-10-CM | POA: Diagnosis not present

## 2016-11-19 DIAGNOSIS — F458 Other somatoform disorders: Secondary | ICD-10-CM | POA: Diagnosis not present

## 2016-11-19 DIAGNOSIS — G500 Trigeminal neuralgia: Secondary | ICD-10-CM | POA: Diagnosis not present

## 2016-11-19 DIAGNOSIS — M25562 Pain in left knee: Secondary | ICD-10-CM | POA: Diagnosis not present

## 2016-11-19 DIAGNOSIS — E785 Hyperlipidemia, unspecified: Secondary | ICD-10-CM | POA: Diagnosis not present

## 2016-11-19 DIAGNOSIS — M1990 Unspecified osteoarthritis, unspecified site: Secondary | ICD-10-CM | POA: Diagnosis not present

## 2016-11-19 DIAGNOSIS — F411 Generalized anxiety disorder: Secondary | ICD-10-CM | POA: Diagnosis not present

## 2016-11-19 DIAGNOSIS — Z4733 Aftercare following explantation of knee joint prosthesis: Secondary | ICD-10-CM | POA: Diagnosis not present

## 2016-11-19 DIAGNOSIS — M6281 Muscle weakness (generalized): Secondary | ICD-10-CM | POA: Diagnosis not present

## 2016-11-19 DIAGNOSIS — R262 Difficulty in walking, not elsewhere classified: Secondary | ICD-10-CM | POA: Diagnosis not present

## 2016-11-19 DIAGNOSIS — R110 Nausea: Secondary | ICD-10-CM | POA: Diagnosis not present

## 2016-11-19 DIAGNOSIS — B999 Unspecified infectious disease: Secondary | ICD-10-CM | POA: Diagnosis not present

## 2016-11-19 DIAGNOSIS — R12 Heartburn: Secondary | ICD-10-CM | POA: Diagnosis not present

## 2016-11-19 DIAGNOSIS — R739 Hyperglycemia, unspecified: Secondary | ICD-10-CM | POA: Diagnosis not present

## 2016-11-19 DIAGNOSIS — N289 Disorder of kidney and ureter, unspecified: Principal | ICD-10-CM

## 2016-11-19 DIAGNOSIS — G9009 Other idiopathic peripheral autonomic neuropathy: Secondary | ICD-10-CM | POA: Diagnosis not present

## 2016-11-19 LAB — CBC
HEMATOCRIT: 34.9 % — AB (ref 36.0–46.0)
HEMOGLOBIN: 10.7 g/dL — AB (ref 12.0–15.0)
MCH: 28 pg (ref 26.0–34.0)
MCHC: 30.7 g/dL (ref 30.0–36.0)
MCV: 91.4 fL (ref 78.0–100.0)
PLATELETS: 348 10*3/uL (ref 150–400)
RBC: 3.82 MIL/uL — ABNORMAL LOW (ref 3.87–5.11)
RDW: 14.3 % (ref 11.5–15.5)
WBC: 11.3 10*3/uL — AB (ref 4.0–10.5)

## 2016-11-19 LAB — ANAEROBIC CULTURE

## 2016-11-19 LAB — BASIC METABOLIC PANEL
ANION GAP: 6 (ref 5–15)
BUN: 21 mg/dL — ABNORMAL HIGH (ref 6–20)
CO2: 34 mmol/L — ABNORMAL HIGH (ref 22–32)
Calcium: 8.4 mg/dL — ABNORMAL LOW (ref 8.9–10.3)
Chloride: 99 mmol/L — ABNORMAL LOW (ref 101–111)
Creatinine, Ser: 0.98 mg/dL (ref 0.44–1.00)
GFR calc Af Amer: >60 mL/min (ref >60)
GFR, EST NON AFRICAN AMERICAN: 55 mL/min — AB (ref >60)
GLUCOSE: 107 mg/dL — AB (ref 65–99)
POTASSIUM: 3.7 mmol/L (ref 3.5–5.1)
Sodium: 139 mmol/L (ref 135–145)

## 2016-11-19 MED ORDER — DOCUSATE SODIUM 100 MG PO CAPS: 100.0000 mg | ORAL_CAPSULE | Freq: Two times a day (BID) | ORAL | 0 refills | Status: DC

## 2016-11-19 MED ORDER — RIVAROXABAN 10 MG PO TABS: 10.0000 mg | ORAL_TABLET | Freq: Every day | ORAL | 0 refills | Status: DC

## 2016-11-19 MED ORDER — HEPARIN SOD (PORK) LOCK FLUSH 100 UNIT/ML IV SOLN
250.0000 [IU] | INTRAVENOUS | Status: AC | PRN
  Administered 2016-11-19: 11:00:00 250 [IU]

## 2016-11-19 MED ORDER — DEXTROSE 5 % IV SOLN: 2.0000 g | Freq: Two times a day (BID) | INTRAVENOUS | 60 refills | Status: DC

## 2016-11-19 MED ORDER — HYDROCODONE-ACETAMINOPHEN 5-325 MG PO TABS
1.0000 | ORAL_TABLET | Freq: Four times a day (QID) | ORAL | 0 refills | Status: DC | PRN
Start: 2016-11-19 — End: 2016-11-20

## 2016-11-19 NOTE — Discharge Summary (Signed)
Patient ID: Laura Chambers MRN: [id number] DOB/AGE: Dec 22, 1940 76 y.o.  Admit date: 11/11/2016 Discharge date: 11/19/2016  Admission Diagnoses:  Principal Problem:   Cellulitis of left lower leg Active Problems:   Knee pain, left   Discharge Diagnoses:  Infected left total knee replacement  Past Medical History:  Diagnosis Date  . Anxiety   . Arthritis   . Complication of anesthesia 2012   "irritated trachea" with cough x 1 year from intubation  . Esophageal hernia   . Fibromyalgia   . HTN (hypertension)   . Hx of colonic polyps   . Hydronephrosis of right kidney   . Hypercholesterolemia   . 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 Obesity   . 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 Renal angiomyolipoma     Surgeries: Procedure(s): EXCISIONAL LEFT TOTAL KNEE ARTHROPLASTY WITH  PLACEMENT OF ANTIBIOTIC SPACERS on 11/11/2016 - 11/14/2016   Consultants:   Discharged Condition: Improved  Hospital Course: Laura Chambers is an 76 y.o. female who was admitted 11/11/2016 for operative treatment ofCellulitis of left lower leg. Patient has severe unremitting pain that affects sleep, daily activities, and work/hobbies. After pre-op clearance the patient was taken to the operating room on 11/11/2016 - 11/14/2016 and underwent  Procedure(s): EXCISIONAL LEFT TOTAL KNEE ARTHROPLASTY WITH  PLACEMENT OF ANTIBIOTIC SPACERS.    Patient was given perioperative antibiotics: Anti-infectives    Start     Dose/Rate Route Frequency Ordered Stop   11/19/16 0000  ceFEPIme 2 g in dextrose 5 % 50 mL     2 g 100 mL/hr over 30 Minutes Intravenous Every 12 hours 11/19/16 0703     11/17/16 2200  ceFEPIme (MAXIPIME) 2 g in dextrose 5 % 50 mL IVPB     2 g 100 mL/hr over 30 Minutes Intravenous Every 12 hours 11/17/16 1726     11/16/16 1800  ceFAZolin (ANCEF) IVPB 1 g/50 mL premix  Status:  Discontinued     1 g 100 mL/hr over 30 Minutes Intravenous  Every 8 hours 11/16/16 1009 11/17/16 1726   11/14/16 2200  ceFAZolin (ANCEF) IVPB 1 g/50 mL premix  Status:  Discontinued     1 g 100 mL/hr over 30 Minutes Intravenous Every 12 hours 11/14/16 1843 11/16/16 1009   11/14/16 1547  piperacillin-tazobactam (ZOSYN) IVPB 3.375 g  Status:  Discontinued     3.375 g 100 mL/hr over 30 Minutes Intravenous 30 min pre-op 11/14/16 1543 11/14/16 1843   11/14/16 1446  piperacillin-tazobactam (ZOSYN) 3.375 GM/50ML IVPB    Comments:  Enrigue Catena   : cabinet override      11/14/16 1446 11/15/16 0259   11/12/16 0800  piperacillin-tazobactam (ZOSYN) IVPB 3.375 g  Status:  Discontinued     3.375 g 100 mL/hr over 30 Minutes Intravenous  Once 11/12/16 0714 11/12/16 0730   11/12/16 0800  vancomycin (VANCOCIN) IVPB 1000 mg/200 mL premix  Status:  Discontinued     1,000 mg 200 mL/hr over 60 Minutes Intravenous  Once 11/12/16 0714 11/12/16 0728   11/12/16 0800  vancomycin (VANCOCIN) 1,500 mg in sodium chloride 0.9 % 500 mL IVPB  Status:  Discontinued     1,500 mg 250 mL/hr over 120 Minutes Intravenous Every 24 hours 11/12/16 0728 11/14/16 1438   11/12/16 0800  piperacillin-tazobactam (ZOSYN) IVPB 3.375 g  Status:  Discontinued     3.375 g 12.5 mL/hr over 240 Minutes Intravenous Every 8 hours  11/12/16 0730 11/14/16 1438   11/11/16 2245  doxycycline (VIBRA-TABS) tablet 100 mg  Status:  Discontinued     100 mg Oral 2 times daily 11/11/16 2242 11/12/16 7169       Patient was given sequential compression devices, early ambulation, and chemoprophylaxis to prevent DVT.  Patient benefited maximally from hospital stay and there were no complications.    Recent vital signs: Patient Vitals for the past 24 hrs:  BP Temp Temp src Pulse Resp SpO2  11/19/16 0509 (!) 108/49 97.6 F (36.4 C) Oral 63 17 97 %  11/19/16 0100 - - - 73 16 96 %  11/18/16 2103 (!) 112/49 99 F (37.2 C) Oral 69 14 98 %  11/18/16 1853 - - - - - 96 %  11/18/16 1501 - 99.2 F (37.3 C) Oral 81 12 98  %  11/18/16 1455 - - - - - (!) 56 %  11/18/16 1453 (!) 116/53 - - 90 - -  11/18/16 0941 (!) 157/68 - - 89 - -     Recent laboratory studies:  Recent Labs  11/18/16 0506 11/19/16 0427  WBC 12.3* 11.3*  HGB 11.0* 10.7*  HCT 35.0* 34.9*  PLT 330 348  NA 137 139  K 3.8 3.7  CL 102 99*  CO2 28 34*  BUN 20 21*  CREATININE 0.85 0.98  GLUCOSE 114* 107*  CALCIUM 8.2* 8.4*     Discharge Medications:   Allergies as of 11/19/2016      Reactions   Epinephrine Other (See Comments)   Reaction:  Increased pts BP and caused shoulder to jerk    Levofloxacin Nausea And Vomiting   Lipitor [atorvastatin] Other (See Comments)   Reaction:  Joint pain and dizziness    Trovan [alatrofloxacin] Nausea And Vomiting   Pt states that this med caused pancreatitis.      Medication List    STOP taking these medications   aspirin EC 81 MG tablet     TAKE these medications   atenolol 25 MG tablet Commonly known as:  TENORMIN TAKE 1 TABLET BY MOUTH DAILY.   CALCIUM 600+D 600-400 MG-UNIT tablet Generic drug:  Calcium Carbonate-Vitamin D Take 1 tablet by mouth every other day.   ceFEPIme 2 g in dextrose 5 % 50 mL Inject 2 g into the vein every 12 (twelve) hours.   docusate sodium 100 MG capsule Commonly known as:  COLACE Take 1 capsule (100 mg total) by mouth 2 (two) times daily.   doxycycline 100 MG tablet Commonly known as:  VIBRA-TABS Take 1 tablet (100 mg total) by mouth 2 (two) times daily.   folic acid 678 MCG tablet Commonly known as:  FOLVITE Take 400 mcg by mouth daily.   gabapentin 300 MG capsule Commonly known as:  NEURONTIN Take 2 capsules (600 mg total) by mouth 3 (three) times daily.   HYDROcodone-acetaminophen 5-325 MG tablet Commonly known as:  NORCO/VICODIN Take 1 tablet by mouth every 6 (six) hours as needed for moderate pain.   losartan-hydrochlorothiazide 50-12.5 MG tablet Commonly known as:  HYZAAR Take 1 tablet by mouth daily.   multivitamin with  minerals Tabs tablet Take 1 tablet by mouth every other day.   promethazine 25 MG tablet Commonly known as:  PHENERGAN Take 25 mg by mouth every 8 (eight) hours as needed for nausea or vomiting.   rivaroxaban 10 MG Tabs tablet Commonly known as:  XARELTO Take 1 tablet (10 mg total) by mouth daily.   rosuvastatin 10 MG  tablet Commonly known as:  CRESTOR Take 1 tablet (10 mg total) by mouth daily.       Diagnostic Studies: Ct Angio Chest Pe W Or Wo Contrast  Result Date: 11/18/2016 CLINICAL DATA:  Shortness of breath today, chest discomfort. History of knee surgery on 11/14/2016. EXAM: CT ANGIOGRAPHY CHEST WITH CONTRAST TECHNIQUE: Multidetector CT imaging of the chest was performed using the standard protocol during bolus administration of intravenous contrast. Multiplanar CT image reconstructions and MIPs were obtained to evaluate the vascular anatomy. CONTRAST:  58 cc Isovue 370 COMPARISON:  None. FINDINGS: Cardiovascular: There is no pulmonary embolism identified within the main, lobar or segmental pulmonary arteries bilaterally. Scattered atherosclerotic changes of the normal caliber thoracic aorta. No aneurysm or dissection. Mild cardiomegaly. No pericardial effusion. Mediastinum/Nodes: Scattered small lymph nodes within the mediastinum and perihilar regions, not pathologic by CT size criteria. Esophagus is unremarkable. Trachea and central bronchi are unremarkable. Lungs/Pleura: Scattered ground-glass opacities bilaterally, most likely mild edema. Small bilateral pleural effusions with adjacent atelectasis. No evidence of pneumonia. No pneumothorax. Upper Abdomen: No acute findings. Musculoskeletal: Mild degenerative change within the thoracic spine. No acute or suspicious osseous finding. Superficial soft tissues are unremarkable. Review of the MIP images confirms the above findings. IMPRESSION: 1. No pulmonary embolism identified. 2. Mild cardiomegaly. Scattered ground-glass opacities  bilaterally, most likely mild edema related to mild CHF/volume overload. 3. Small bilateral pleural effusions with adjacent atelectasis. 4. Aortic atherosclerosis. Electronically Signed   By: Franki Cabot M.D.   On: 11/18/2016 17:29    Disposition: to skilled nursing facility  Discharge Instructions    Discharge patient    Complete by:  As directed    Discharge disposition:  03-Skilled [place]   Discharge patient date:  11/19/2016      Follow-up Information    [name], MD. Schedule an appointment as soon as possible for a visit in 2 week(s).   Specialty:  Orthopedic Surgery Contact information: [place] [address] [phone]            Signed: [name] 11/19/2016, 7:03 AM

## 2016-11-19 NOTE — Clinical Social Work Placement (Signed)
   CLINICAL SOCIAL WORK PLACEMENT  NOTE  Date:  11/19/2016  Patient Details  Name: Laura Chambers MRN: [id number] Date of Birth: 06-24-1941  Clinical Social Work is seeking post-discharge placement for this patient at the [place] level of care (*CSW will initial, date and re-position this form in  chart as items are completed):  Yes   Patient/family provided with [facility] Work Department's list of facilities offering this level of care within the geographic area requested by the patient (or if unable, by the patient's family).  Yes   Patient/family informed of their freedom to choose among providers that offer the needed level of care, that participate in Medicare, Medicaid or managed care program needed by the patient, have an available bed and are willing to accept the patient.  Yes   Patient/family informed of [facility]'s ownership interest in [facility] and [facility], as well as of the fact that they are under no obligation to receive care at these facilities.  PASRR submitted to EDS on 11/17/16     PASRR number received on 11/17/16     Existing PASRR number confirmed on       FL2 transmitted to all facilities in geographic area requested by pt/family on 11/17/16     FL2 transmitted to all facilities within larger geographic area on       Patient informed that his/her managed care company has contracts with or will negotiate with certain facilities, including the following:        Yes   Patient/family informed of bed offers received.  [facility]     Physician recommends and patient chooses bed at      Patient to be transferred to [facility] on 11/19/16.  Patient to be transferred to facility by [facility]     Patient family notified on 11/19/16 of transfer.  Name of family member notified:  SON     PHYSICIAN       Additional Comment: Pt / son are in agreement with d/c to [facility]  today. [facility] transport required. Medical necessity form completed. Pt is aware out of pocket costs may be associated with [facility] transport. Health Team Adv provided authorization for SNF placement. D/C Summary sent to SNF for review. Scripts included in d/c packet. # for report provided to nsg.   _______________________________________________ Luretha Rued, LCSW  [phone] 11/19/2016, 1:16 PM

## 2016-11-19 NOTE — Discharge Instructions (Signed)
Only touch-down weight on left knee in knee immobilizer. Does not need to wear the knee immobilizer in bed; only wear knee immobilizer when up. Keep current knee dressing on until her outpatient orthopedic follow-up in 2 weeks.  Information on my medicine - XARELTO (Rivaroxaban)  This medication education was reviewed with me or my healthcare representative as part of my discharge preparation.  The pharmacist that spoke with me during my hospital stay was:  Melville Engen A, RPH  Why was Xarelto prescribed for you? Xarelto was prescribed for you to reduce the risk of blood clots forming after orthopedic surgery. The medical term for these abnormal blood clots is venous thromboembolism (VTE).  What do you need to know about xarelto ? Take your Xarelto ONCE DAILY at the same time every day. You may take it either with or without food.  If you have difficulty swallowing the tablet whole, you may crush it and mix in applesauce just prior to taking your dose.  Take Xarelto exactly as prescribed by your doctor and DO NOT stop taking Xarelto without talking to the doctor who prescribed the medication.  Stopping without other VTE prevention medication to take the place of Xarelto may increase your risk of developing a clot.  After discharge, you should have regular check-up appointments with your healthcare provider that is prescribing your Xarelto.    What do you do if you miss a dose? If you miss a dose, take it as soon as you remember on the same day then continue your regularly scheduled once daily regimen the next day. Do not take two doses of Xarelto on the same day.   Important Safety Information A possible side effect of Xarelto is bleeding. You should call your healthcare provider right away if you experience any of the following: ? Bleeding from an injury or your nose that does not stop. ? Unusual colored urine (red or dark brown) or unusual colored stools (red or  black). ? Unusual bruising for unknown reasons. ? A serious fall or if you hit your head (even if there is no bleeding).  Some medicines may interact with Xarelto and might increase your risk of bleeding while on Xarelto. To help avoid this, consult your healthcare provider or pharmacist prior to using any new prescription or non-prescription medications, including herbals, vitamins, non-steroidal anti-inflammatory drugs (NSAIDs) and supplements.  This website has more information on Xarelto: [email or web address]

## 2016-11-19 NOTE — Progress Notes (Signed)
Physical Therapy Treatment Patient Details Name: Laura Chambers MRN: [id number] DOB: February 14, 1941 Today's Date: 11/19/2016    History of Present Illness Pt is s/p L antibiotic spacer; h/o TKA, THA, back sxs    PT Comments    Pt's cognition appears improved since previous notes.  Pt tolerated sitting EOB well and agreeable to attempt pivot transfer to recliner.  Pt continues to require +2 assist however able to mobilize OOB today!!  Plans for SNF later today.   Follow Up Recommendations  SNF     Equipment Recommendations  None recommended by PT    Recommendations for Other Services       Precautions / Restrictions Precautions Precautions: Fall;Knee Required Braces or Orthoses: Knee Immobilizer - Left Knee Immobilizer - Left: On at all times Restrictions Weight Bearing Restrictions: Yes LLE Weight Bearing: Touchdown weight bearing    Mobility  Bed Mobility Overal bed mobility: Needs Assistance Bed Mobility: Supine to Sit     Supine to sit: Min assist;HOB elevated     General bed mobility comments: Increased time with cues for sequence, heavy use of rail, assist for L LE  Transfers Overall transfer level: Needs assistance Equipment used: Rolling walker (2 wheeled) Transfers: Sit to/from [facility] Sit to Stand: Mod assist;+2 physical assistance;From elevated surface Stand pivot transfers: Mod assist;+2 physical assistance       General transfer comment: verbal cues for safe technique, encouraged keeping L LE forward to cue to TDWB, max cues for WBing through RW and pivoting on R LE to recliner  Ambulation/Gait                 Stairs            Wheelchair Mobility    Modified Rankin (Stroke Patients Only)       Balance Overall balance assessment: Needs assistance Sitting-balance support: Feet supported;No upper extremity supported Sitting balance-Leahy Scale: Fair     Standing balance support: Bilateral upper extremity  supported Standing balance-Leahy Scale: Zero Standing balance comment: requires UE support and external assist                            Cognition Arousal/Alertness: Awake/alert Behavior During Therapy: WFL for tasks assessed/performed Overall Cognitive Status: Within Functional Limits for tasks assessed                                        Exercises      General Comments        Pertinent Vitals/Pain Pain Assessment: Faces Faces Pain Scale: Hurts even more Pain Location: L knee Pain Descriptors / Indicators: Grimacing;Guarding Pain Intervention(s): Limited activity within patient's tolerance;Monitored during session;Repositioned;Ice applied  2L O2 [place] maintained, SPO2 97% on continuous pulse ox upon sitting in recliner    Home Living                      Prior Function            PT Goals (current goals can now be found in the care plan section) Progress towards PT goals: Progressing toward goals    Frequency    7X/week      PT Plan Current plan remains appropriate    Co-evaluation              AM-PAC PT "6 Clicks" Daily Activity  Outcome Measure  Difficulty turning over in bed (including adjusting bedclothes, sheets and blankets)?: A Little Difficulty moving from lying on back to sitting on the side of the bed? : A Lot Difficulty sitting down on and standing up from a chair with arms (e.g., wheelchair, bedside commode, etc,.)?: A Lot Help needed moving to and from a bed to chair (including a wheelchair)?: A Lot Help needed walking in hospital room?: Total Help needed climbing 3-5 steps with a railing? : Total 6 Click Score: 11    End of Session Equipment Utilized During Treatment: Left knee immobilizer;Oxygen Activity Tolerance: Patient tolerated treatment well Patient left: in chair;with call bell/phone within reach;with chair alarm set Nurse Communication: Mobility status PT Visit Diagnosis: Difficulty in  walking, not elsewhere classified (R26.2);Muscle weakness (generalized) (M62.81)     Time: 6294-7654 PT Time Calculation (min) (ACUTE ONLY): 17 min  Charges:  $Therapeutic Activity: 8-22 mins                    G Codes:       Carmelia Bake, PT, DPT 11/19/2016 Pager: [phone]   York Ram E 11/19/2016, 1:08 PM

## 2016-11-19 NOTE — Progress Notes (Signed)
Patient ID: Laura Chambers, female   DOB: Nov 13, 1940, 76 y.o.   MRN: [id number] Improving slowly overall.  Can be discharged to skilled nursing this afternoon if bed available.

## 2016-11-20 ENCOUNTER — Encounter: Payer: Self-pay | Admitting: Internal Medicine

## 2016-11-20 ENCOUNTER — Ambulatory Visit (INDEPENDENT_AMBULATORY_CARE_PROVIDER_SITE_OTHER): Payer: PPO | Admitting: Physician Assistant

## 2016-11-20 ENCOUNTER — Non-Acute Institutional Stay (SKILLED_NURSING_FACILITY): Payer: PPO | Admitting: Internal Medicine

## 2016-11-20 ENCOUNTER — Other Ambulatory Visit: Payer: Self-pay

## 2016-11-20 DIAGNOSIS — F419 Anxiety disorder, unspecified: Secondary | ICD-10-CM

## 2016-11-20 DIAGNOSIS — L03116 Cellulitis of left lower limb: Secondary | ICD-10-CM

## 2016-11-20 DIAGNOSIS — E785 Hyperlipidemia, unspecified: Secondary | ICD-10-CM

## 2016-11-20 DIAGNOSIS — G6289 Other specified polyneuropathies: Secondary | ICD-10-CM | POA: Diagnosis not present

## 2016-11-20 DIAGNOSIS — I119 Hypertensive heart disease without heart failure: Secondary | ICD-10-CM

## 2016-11-20 MED ORDER — HYDROCODONE-ACETAMINOPHEN 5-325 MG PO TABS: 1.0000 | ORAL_TABLET | Freq: Four times a day (QID) | ORAL | 0 refills | Status: DC | PRN

## 2016-11-20 NOTE — Progress Notes (Signed)
Provider:  Veleta Miners Location:   [facility] Room Number: 128/P Place of Service:  SNF (31)  PCP: Lelon Perla, MD Patient Care Team: Lelon Perla, MD as PCP - General (Cardiology)  Extended Emergency Contact Information Primary Emergency Contact: Tish Frederickson, [place] [address] [name] of [place] Phone: [phone] Relation: Son Secondary Emergency Contact: Mosetta Pigeon [place] of [place] Mobile Phone: [phone] Relation: Niece  Code Status: Full Code Goals of Care: Advanced Directive information Advanced Directives 11/20/2016  Does Patient Have a Medical Advance Directive? Yes  Type of Advance Directive (No Data)  Does patient want to make changes to medical advance directive? No - Patient declined  Would patient like information on creating a medical advance directive? No - Patient declined  Pre-existing out of facility DNR order (yellow form or pink MOST form) -      Chief Complaint  Patient presents with  . New Admit To SNF    HPI: Patient is a 76 y.o. female seen today for admission to SNF for therapy and IV Antibiotics. Patient has h/o Hypertension and left total Knee replacement in 2001 who was admitted in hospital with worsening Left knee pain. It seems she was seeing Orthopedics for Chronic Left pain and and was getting treatment for the infection in that knee. She was treated with IV Antibiotics. She underwent Excisional Left Total Knee Arthroplasty with Placement of Antibiotic Spacer on 11/11/16. She is on IV Cefepime for 4 weeks till 06/20.  She had rough night yesterday in the facility. She says she couldn't sleep and had panic attack.She is also on Oxygen in the facility as had SOB in the hospital. But denies being on one at home. She denies any SOB, Chest pain or Cough. No fever but has some shaking and tremors which are new also. Her pain is controlled on Norco but she seems little confused  and just tired.  Paient was independent before the surgery . She works for [employer]. She lives by herself and was driving.  Past Medical History:  Diagnosis Date  . Anxiety   . Arthritis   . Complication of anesthesia 2012   "irritated trachea" with cough x 1 year from intubation  . Esophageal hernia   . Fibromyalgia   . HTN (hypertension)   . Hx of colonic polyps   . Hydronephrosis of right kidney   . Hypercholesterolemia   . 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 Obesity   . 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 Renal angiomyolipoma    Past Surgical History:  Procedure Laterality Date  . ABDOMINAL HYSTERECTOMY  1969  . BACK SURGERY  2012   rod in back-  lumbar disc removal with bone graft  . blephoroplasty     bilateral  . BREAST BIOPSY  1989  . CHOLECYSTECTOMY  2010  . EXCISIONAL TOTAL KNEE ARTHROPLASTY WITH ANTIBIOTIC SPACERS Left 11/14/2016   Procedure: EXCISIONAL LEFT TOTAL KNEE ARTHROPLASTY WITH  PLACEMENT OF ANTIBIOTIC SPACERS;  Surgeon: [name], MD;  Location: [facility];  Service: Orthopedics;  Laterality: Left;  . NISSEN FUNDOPLICATION    . PARAESOPHAGEAL HERNIA REPAIR  1989  . TONSILLECTOMY  1946  . TOTAL HIP ARTHROPLASTY Right 08/31/2012   Procedure: TOTAL HIP ARTHROPLASTY ANTERIOR APPROACH;  Surgeon: Mauri Pole, MD;  Location: [facility];  Service: Orthopedics;  Laterality:  Right;  Marland Kitchen TOTAL KNEE ARTHROPLASTY  2001   left  . TOTAL KNEE ARTHROPLASTY  2010   right    reports that she has never smoked. She has never used smokeless tobacco. She reports that she does not drink alcohol or use drugs. Social History   Social History  . Marital status: Divorced    Spouse name: N/A  . Number of children: 3  . Years of education: College   Occupational History  .  [employer]   Social History Main Topics  . Smoking status: Never Smoker  . Smokeless tobacco: Never  Used  . Alcohol use No  . Drug use: No  . Sexual activity: Not on file   Other Topics Concern  . Not on file   Social History Narrative   Patient lives at home alone.   Caffeine Use: 1 cup daily    Functional Status Survey:    Family History  Problem Relation Age of Onset  . Diabetes Father   . Osteoarthritis Father   . Heart disease Father   . Stroke Father   . Osteoarthritis Mother   . Stroke Mother   . Pneumonia Mother   . Colon cancer Sister 42  . Colon polyps Unknown        neice/nephew  . Diabetes Sister   . Diabetes Brother     Health Maintenance  Topic Date Due  . Samul Dada  05/19/1959  . DEXA SCAN  05/18/2005  . PNA vac Low Risk Adult (1 of 2 - PCV13) 06/06/2017 (Originally 05/18/2005)  . INFLUENZA VACCINE  02/04/2017  . COLONOSCOPY  01/26/2019    Allergies  Allergen Reactions  . Epinephrine Other (See Comments)    Reaction:  Increased pts BP and caused shoulder to jerk   . Levofloxacin Nausea And Vomiting  . Lipitor [Atorvastatin] Other (See Comments)    Reaction:  Joint pain and dizziness   . Trovan [Alatrofloxacin] Nausea And Vomiting    Pt states that this med caused pancreatitis.    Allergies as of 11/20/2016      Reactions   Epinephrine Other (See Comments)   Reaction:  Increased pts BP and caused shoulder to jerk    Levofloxacin Nausea And Vomiting   Lipitor [atorvastatin] Other (See Comments)   Reaction:  Joint pain and dizziness    Trovan [alatrofloxacin] Nausea And Vomiting   Pt states that this med caused pancreatitis.      Medication List       Accurate as of 11/20/16 12:34 PM. Always use your most recent med list.          atenolol 25 MG tablet Commonly known as:  TENORMIN TAKE 1 TABLET BY MOUTH DAILY.   CALCIUM 600+D 600-400 MG-UNIT tablet Generic drug:  Calcium Carbonate-Vitamin D Take 1 tablet by mouth every other day.   ceFEPIme 2 g in dextrose 5 % 50 mL Inject 2 g into the vein every 12 (twelve) hours.     docusate sodium 100 MG capsule Commonly known as:  COLACE Take 1 capsule (100 mg total) by mouth 2 (two) times daily.   folic acid 782 MCG tablet Commonly known as:  FOLVITE Take 400 mcg by mouth daily.   gabapentin 300 MG capsule Commonly known as:  NEURONTIN Take 2 capsules (600 mg total) by mouth 3 (three) times daily.   HYDROcodone-acetaminophen 5-325 MG tablet Commonly known as:  NORCO/VICODIN Take 1 tablet by mouth every 6 (six) hours  as needed for moderate pain. DO NOT EXCEED 3GM OF APAP IN 24 HOURS FROM ALL SOURCES   losartan-hydrochlorothiazide 50-12.5 MG tablet Commonly known as:  HYZAAR Take 1 tablet by mouth daily.   multivitamin with minerals Tabs tablet Take 1 tablet by mouth every other day.   promethazine 25 MG tablet Commonly known as:  PHENERGAN Take 25 mg by mouth every 8 (eight) hours as needed for nausea or vomiting.   rivaroxaban 10 MG Tabs tablet Commonly known as:  XARELTO Take 1 tablet (10 mg total) by mouth daily.   rosuvastatin 10 MG tablet Commonly known as:  CRESTOR Take 1 tablet (10 mg total) by mouth daily.       Review of Systems  Review of Systems  Constitutional: Negative for activity change, appetite change, chills, diaphoresis, fatigue and fever.  HENT: Negative for mouth sores, postnasal drip, rhinorrhea, sinus pain and sore throat.   Respiratory: Negative for apnea, cough, chest tightness, shortness of breath and wheezing.   Cardiovascular: Negative for chest pain, palpitations Gastrointestinal: Negative for abdominal distention, abdominal pain, constipation, diarrhea, nausea and vomiting.  Genitourinary: Negative for dysuria and frequency.  Musculoskeletal: Negative for arthralgias, joint swelling and myalgias.  Skin: Negative for rash.  Neurological: Negative for dizziness, syncope, weakness, light-headedness and numbness.  Psychiatric/Behavioral: Negative for behavioral problems, Positive for anxiety  Vitals:   11/20/16  1156  BP: (!) 184/74 repeat Manually was 131/62  Pulse: 86  Resp: 20  Temp: 97.9 F (36.6 C)  TempSrc: Oral   There is no height or weight on file to calculate BMI. Physical Exam  Constitutional: She is oriented to person, place, and time. She appears well-developed and well-nourished.  HENT:  Head: Normocephalic.  Some erythema in her Throat  Eyes: Pupils are equal, round, and reactive to light.  Neck: Neck supple.  Cardiovascular: Normal rate, regular rhythm and normal heart sounds.   No murmur heard. Pulmonary/Chest: Effort normal and breath sounds normal. No respiratory distress. She has no wheezes. She has no rales.  Abdominal: Bowel sounds are normal. She exhibits no distension. There is no tenderness. There is no rebound.  Has midline healed scar from previous surgery.  Musculoskeletal:  Has no edema in right LE Significant Swelling in Left knee.  Neurological: She is alert and oriented to person, place, and time.  Has good strength in all extremities. Left LE was not tested.  Skin: Skin is warm and dry.  Psychiatric: Her behavior is normal. Thought content normal. Her mood appears anxious. Her speech is tangential. Cognition and memory are normal.    Labs reviewed: Basic Metabolic Panel:  Recent Labs  11/16/16 0507 11/17/16 0449 11/18/16 0506 11/19/16 0427  NA 135  --  137 139  K 4.1  --  3.8 3.7  CL 102  --  102 99*  CO2 25  --  28 34*  GLUCOSE 112*  --  114* 107*  BUN 40*  --  20 21*  CREATININE 1.51* 1.13* 0.85 0.98  CALCIUM 8.0*  --  8.2* 8.4*   Liver Function Tests:  Recent Labs  04/15/16 1145  AST 19  ALT 11  ALKPHOS 82  BILITOT 1.4*  PROT 6.5  ALBUMIN 3.7   No results for input(s): LIPASE, AMYLASE in the last 8760 hours. No results for input(s): AMMONIA in the last 8760 hours. CBC:  Recent Labs  11/11/16 2023  11/17/16 0817 11/18/16 0506 11/19/16 0427  WBC 14.8*  < > 12.4* 12.3* 11.3*  NEUTROABS  11.3*  --   --   --   --   HGB 12.9   < > 11.1* 11.0* 10.7*  HCT 38.1  < > 34.9* 35.0* 34.9*  MCV 88.2  < > 89.9 89.7 91.4  PLT 376  < > 339 330 348  < > = values in this interval not displayed. Cardiac Enzymes: No results for input(s): CKTOTAL, CKMB, CKMBINDEX, TROPONINI in the last 8760 hours. BNP: Invalid input(s): POCBNP No results found for: HGBA1C No results found for: TSH No results found for: VITAMINB12 No results found for: FOLATE No results found for: IRON, TIBC, FERRITIN  Imaging and Procedures obtained prior to SNF admission: No results found.  Assessment/Plan Cellulitis of left lower leg And S/P   Excisional Left Total Knee Arthroplasty with Placement of Antibiotic Spacer  Continue on IV cefepime Patient has follow up with Ortho office today. She has immobilizer for that knee. Will start therapy and follow along She is on Xarelto for DVT prophalaxis  White count is trending down  Essential Hypertension BP is controlled. 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 Little confused about the events ? Norco Side effect. Will try to limit the use Will repeat labs and UA  Also Slowly taper of her Oxygen as she was not on Oxygen before and is not smoker  Anemia S/p Surgery will follow   Family/ staff Communication:   Labs/tests ordered: BMP, CBC And TSH  Total time spent in this patient care encounter was 45_ minutes; greater than 50% of the visit spent counseling patient and coordinating care for problems addressed at this encounter.

## 2016-11-20 NOTE — Telephone Encounter (Signed)
RX faxed to [facility] @ [phone]. Phone number [phone]  

## 2016-11-24 ENCOUNTER — Encounter (HOSPITAL_COMMUNITY)
Admission: RE | Admit: 2016-11-24 | Discharge: 2016-11-24 | Disposition: A | Discharge status: Home / Self Care | Payer: PPO | Source: Skilled Nursing Facility | Attending: Internal Medicine | Admitting: Internal Medicine

## 2016-11-24 ENCOUNTER — Other Ambulatory Visit: Payer: Self-pay | Admitting: *Deleted

## 2016-11-24 DIAGNOSIS — I119 Hypertensive heart disease without heart failure: Secondary | ICD-10-CM | POA: Insufficient documentation

## 2016-11-24 DIAGNOSIS — F411 Generalized anxiety disorder: Secondary | ICD-10-CM | POA: Insufficient documentation

## 2016-11-24 DIAGNOSIS — G500 Trigeminal neuralgia: Secondary | ICD-10-CM | POA: Insufficient documentation

## 2016-11-24 DIAGNOSIS — Z4733 Aftercare following explantation of knee joint prosthesis: Secondary | ICD-10-CM | POA: Insufficient documentation

## 2016-11-24 DIAGNOSIS — E785 Hyperlipidemia, unspecified: Secondary | ICD-10-CM | POA: Insufficient documentation

## 2016-11-24 DIAGNOSIS — F458 Other somatoform disorders: Secondary | ICD-10-CM | POA: Insufficient documentation

## 2016-11-24 DIAGNOSIS — G9009 Other idiopathic peripheral autonomic neuropathy: Secondary | ICD-10-CM | POA: Insufficient documentation

## 2016-11-24 LAB — BASIC METABOLIC PANEL
Anion gap: 9 (ref 5–15)
BUN: 22 mg/dL — AB (ref 6–20)
CHLORIDE: 99 mmol/L — AB (ref 101–111)
CO2: 34 mmol/L — AB (ref 22–32)
CREATININE: 0.97 mg/dL (ref 0.44–1.00)
Calcium: 8.2 mg/dL — ABNORMAL LOW (ref 8.9–10.3)
GFR calc Af Amer: >60 mL/min (ref >60)
GFR calc non Af Amer: 55 mL/min — ABNORMAL LOW (ref >60)
GLUCOSE: 108 mg/dL — AB (ref 65–99)
Potassium: 3.2 mmol/L — ABNORMAL LOW (ref 3.5–5.1)
Sodium: 142 mmol/L (ref 135–145)

## 2016-11-24 LAB — CBC WITH DIFFERENTIAL/PLATELET
Basophils Absolute: 0.1 10*3/uL (ref 0.0–0.1)
Basophils Relative: 1 %
EOS PCT: 4 %
Eosinophils Absolute: 0.6 10*3/uL (ref 0.0–0.7)
HEMATOCRIT: 32.7 % — AB (ref 36.0–46.0)
Hemoglobin: 10.4 g/dL — ABNORMAL LOW (ref 12.0–15.0)
LYMPHS PCT: 17 %
Lymphs Abs: 2.4 10*3/uL (ref 0.7–4.0)
MCH: 29 pg (ref 26.0–34.0)
MCHC: 31.8 g/dL (ref 30.0–36.0)
MCV: 91.1 fL (ref 78.0–100.0)
MONO ABS: 1.3 10*3/uL — AB (ref 0.1–1.0)
Monocytes Relative: 9 %
Neutro Abs: 10.1 10*3/uL — ABNORMAL HIGH (ref 1.7–7.7)
Neutrophils Relative %: 69 %
Platelets: 334 10*3/uL (ref 150–400)
RBC: 3.59 MIL/uL — ABNORMAL LOW (ref 3.87–5.11)
RDW: 14.5 % (ref 11.5–15.5)
WBC: 14.4 10*3/uL — AB (ref 4.0–10.5)

## 2016-11-24 LAB — TSH: TSH: 5.347 u[IU]/mL — ABNORMAL HIGH (ref 0.350–4.500)

## 2016-11-24 MED ORDER — HYDROCODONE-ACETAMINOPHEN 5-325 MG PO TABS: 1.0000 | ORAL_TABLET | Freq: Four times a day (QID) | ORAL | 0 refills | Status: DC | PRN

## 2016-11-24 NOTE — Telephone Encounter (Signed)
[facility] #[phone] Fax: [phone]   

## 2016-11-26 ENCOUNTER — Encounter (HOSPITAL_COMMUNITY)
Admission: RE | Admit: 2016-11-26 | Discharge: 2016-11-26 | Disposition: A | Discharge status: Home / Self Care | Payer: PPO | Source: Skilled Nursing Facility | Attending: *Deleted | Admitting: *Deleted

## 2016-11-28 LAB — URINE CULTURE: Culture: NO GROWTH

## 2016-12-01 ENCOUNTER — Encounter (HOSPITAL_COMMUNITY)
Admission: RE | Admit: 2016-12-01 | Discharge: 2016-12-01 | Disposition: A | Discharge status: Home / Self Care | Payer: PPO | Source: Skilled Nursing Facility | Attending: *Deleted | Admitting: *Deleted

## 2016-12-01 LAB — BASIC METABOLIC PANEL
ANION GAP: 7 (ref 5–15)
BUN: 28 mg/dL — ABNORMAL HIGH (ref 6–20)
CALCIUM: 9 mg/dL (ref 8.9–10.3)
CO2: 29 mmol/L (ref 22–32)
CREATININE: 1.14 mg/dL — AB (ref 0.44–1.00)
Chloride: 103 mmol/L (ref 101–111)
GFR calc Af Amer: 53 mL/min — ABNORMAL LOW (ref >60)
GFR, EST NON AFRICAN AMERICAN: 46 mL/min — AB (ref >60)
Glucose, Bld: 92 mg/dL (ref 65–99)
Potassium: 4.2 mmol/L (ref 3.5–5.1)
Sodium: 139 mmol/L (ref 135–145)

## 2016-12-01 LAB — T4, FREE: FREE T4: 1 ng/dL (ref 0.61–1.12)

## 2016-12-02 ENCOUNTER — Non-Acute Institutional Stay (SKILLED_NURSING_FACILITY): Payer: PPO | Admitting: Internal Medicine

## 2016-12-02 ENCOUNTER — Encounter: Payer: Self-pay | Admitting: Internal Medicine

## 2016-12-02 ENCOUNTER — Encounter (HOSPITAL_COMMUNITY)
Admission: RE | Admit: 2016-12-02 | Discharge: 2016-12-02 | Disposition: A | Discharge status: Home / Self Care | Payer: PPO | Source: Skilled Nursing Facility | Attending: Internal Medicine | Admitting: Internal Medicine

## 2016-12-02 DIAGNOSIS — Z4733 Aftercare following explantation of knee joint prosthesis: Secondary | ICD-10-CM | POA: Insufficient documentation

## 2016-12-02 DIAGNOSIS — R339 Retention of urine, unspecified: Secondary | ICD-10-CM

## 2016-12-02 DIAGNOSIS — F458 Other somatoform disorders: Secondary | ICD-10-CM | POA: Insufficient documentation

## 2016-12-02 DIAGNOSIS — G9009 Other idiopathic peripheral autonomic neuropathy: Secondary | ICD-10-CM | POA: Insufficient documentation

## 2016-12-02 DIAGNOSIS — F411 Generalized anxiety disorder: Secondary | ICD-10-CM | POA: Insufficient documentation

## 2016-12-02 DIAGNOSIS — L03116 Cellulitis of left lower limb: Secondary | ICD-10-CM

## 2016-12-02 DIAGNOSIS — G500 Trigeminal neuralgia: Secondary | ICD-10-CM | POA: Insufficient documentation

## 2016-12-02 DIAGNOSIS — E785 Hyperlipidemia, unspecified: Secondary | ICD-10-CM | POA: Insufficient documentation

## 2016-12-02 DIAGNOSIS — G6289 Other specified polyneuropathies: Secondary | ICD-10-CM

## 2016-12-02 NOTE — Progress Notes (Signed)
Location:   [facility] Room Number: 128/P Place of Service:  SNF (31) Provider:  Charyl Bigger, Denice Bors, MD  Patient Care Team: Lelon Perla, MD as PCP - General (Cardiology)  Extended Emergency Contact Information Primary Emergency Contact: Tish Frederickson, [place] [address] [name] of [place] Phone: [phone] Relation: Son Secondary Emergency Contact: Mosetta Pigeon [place] of [place] Mobile Phone: [phone] Relation: Niece  Code Status:  Full Code Goals of care: Advanced Directive information Advanced Directives 12/02/2016  Does Patient Have a Medical Advance Directive? Yes  Type of Advance Directive (No Data)  Does patient want to make changes to medical advance directive? No - Patient declined  Would patient like information on creating a medical advance directive? No - Patient declined  Pre-existing out of facility DNR order (yellow form or pink MOST form) -     Chief Complaint  Patient presents with  . Acute Visit    F/U    HPI:  Pt is a 76 y.o. female seen today for an acute visit for Urinary retention  Patient has h/o Hypertension and left total Knee replacement in 2001 who was admitted in hospital with worsening Left knee pain. She was treated with IV Antibiotics. She underwent Excisional Left Total Knee Arthroplasty with Placement of Antibiotic Spacer on 11/11/16. She is on IV Cefepime for 4 weeks till 06/20. Since she has been in the facility she has not been able to void and has needed Chronic Foley Cathter. First time she had PVR of almost 1000cc and then the attempt last week to remove the cathter she had PVR of 1200 cc. This morning her cathter was removed again as she was c/o hurting and some bleeding from the insert location. She has not been able to void since then that was 5 hours ago. She says she feels pressure but not able to empty her bladder. She did not have any problems with  Urinary incontinence before She has seen Urologist for some ? Benign renal tumor She is otherwise doing well on Antibiotics and has follow up with Ortho tomorrow. She is not having any diarrhea . Her Knee pain is controlled on Norco.    Past Medical History:  Diagnosis Date  . Anxiety   . Arthritis   . Complication of anesthesia 2012   "irritated trachea" with cough x 1 year from intubation  . Esophageal hernia   . Fibromyalgia   . HTN (hypertension)   . Hx of colonic polyps   . Hydronephrosis of right kidney   . Hypercholesterolemia   . 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 Obesity   . 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 Renal angiomyolipoma    Past Surgical History:  Procedure Laterality Date  . ABDOMINAL HYSTERECTOMY  1969  . BACK SURGERY  2012   rod in back-  lumbar disc removal with bone graft  . blephoroplasty     bilateral  . BREAST BIOPSY  1989  . CHOLECYSTECTOMY  2010  . EXCISIONAL TOTAL KNEE ARTHROPLASTY WITH ANTIBIOTIC SPACERS Left 11/14/2016   Procedure: EXCISIONAL LEFT TOTAL KNEE ARTHROPLASTY WITH  PLACEMENT OF ANTIBIOTIC SPACERS;  Surgeon: [name], MD;  Location: [facility];  Service: Orthopedics;  Laterality: Left;  . NISSEN FUNDOPLICATION    . PARAESOPHAGEAL HERNIA REPAIR  1989  . TONSILLECTOMY  1946  . TOTAL  HIP ARTHROPLASTY Right 08/31/2012   Procedure: TOTAL HIP ARTHROPLASTY ANTERIOR APPROACH;  Surgeon: Mauri Pole, MD;  Location: [facility];  Service: Orthopedics;  Laterality: Right;  . TOTAL KNEE ARTHROPLASTY  2001   left  . TOTAL KNEE ARTHROPLASTY  2010   right    Allergies  Allergen Reactions  . Epinephrine Other (See Comments)    Reaction:  Increased pts BP and caused shoulder to jerk   . Levofloxacin Nausea And Vomiting  . Lipitor [Atorvastatin] Other (See Comments)    Reaction:  Joint pain and dizziness   . Trovan [Alatrofloxacin] Nausea And Vomiting    Pt states that  this med caused pancreatitis.    Review of Systems  Review of Systems  Constitutional: Negative for activity change, appetite change, chills, diaphoresis, fatigue and fever.  HENT: Negative for mouth sores, postnasal drip, rhinorrhea, sinus pain and sore throat.   Respiratory: Negative for apnea, cough, chest tightness, shortness of breath and wheezing.   Cardiovascular: Negative for chest pain, palpitations and leg swelling.  Gastrointestinal: Negative for abdominal distention, abdominal pain, constipation, diarrhea, nausea and vomiting.  Genitourinary: Negative for dysuria and frequency.  Musculoskeletal: Negative for arthralgias, joint swelling and myalgias.  Skin: Negative for rash.  Neurological: Negative for dizziness, syncope, weakness, light-headedness and numbness.  Psychiatric/Behavioral: Negative for behavioral problems, confusion and sleep disturbance.    Outpatient Encounter Prescriptions as of 12/02/2016  Medication Sig  . ALPRAZolam (XANAX) 0.25 MG tablet Take 0.25 mg by mouth at bedtime as needed for anxiety.  Marland Kitchen atenolol (TENORMIN) 25 MG tablet TAKE 1 TABLET BY MOUTH DAILY.  . Calcium Carbonate-Vitamin D (CALCIUM 600+D) 600-400 MG-UNIT tablet Take 1 tablet by mouth every other day.  . ceFEPIme 2 g in dextrose 5 % 50 mL Inject 2 g into the vein every 12 (twelve) hours.  . docusate sodium (COLACE) 100 MG capsule Take 1 capsule (100 mg total) by mouth 2 (two) times daily.  . folic acid (FOLVITE) 025 MCG tablet Take 400 mcg by mouth daily.  Marland Kitchen gabapentin (NEURONTIN) 300 MG capsule Take 2 capsules (600 mg total) by mouth 3 (three) times daily.  Marland Kitchen HYDROcodone-acetaminophen (NORCO/VICODIN) 5-325 MG tablet Take 1 tablet by mouth every 6 (six) hours as needed for moderate pain. DO NOT EXCEED 3GM OF APAP IN 24 HOURS FROM ALL SOURCES  . losartan-hydrochlorothiazide (HYZAAR) 50-12.5 MG tablet Take 1 tablet by mouth daily.  . methocarbamol (ROBAXIN) 500 MG tablet Take 500 mg by mouth 3  (three) times daily. For 14 days stop on 12/07/2016  . Multiple Vitamin (MULTIVITAMIN WITH MINERALS) TABS tablet Take 1 tablet by mouth every other day.  . potassium chloride SA (K-DUR,KLOR-CON) 20 MEQ tablet Take 20 mEq by mouth daily.  . promethazine (PHENERGAN) 25 MG tablet Take 25 mg by mouth every 8 (eight) hours as needed for nausea or vomiting.   . rivaroxaban (XARELTO) 10 MG TABS tablet Take 1 tablet (10 mg total) by mouth daily.  . rosuvastatin (CRESTOR) 10 MG tablet Take 1 tablet (10 mg total) by mouth daily.   No facility-administered encounter medications on file as of 12/02/2016.      There is no immunization history on file for this patient. Pertinent  Health Maintenance Due  Topic Date Due  . DEXA SCAN  05/18/2005  . PNA vac Low Risk Adult (1 of 2 - PCV13) 06/06/2017 (Originally 05/18/2005)  . INFLUENZA VACCINE  02/04/2017  . COLONOSCOPY  01/26/2019   No flowsheet data found. Functional Status Survey:  Vitals:   12/02/16 1046  BP: (!) 143/76  Pulse: (!) 54  Resp: 20  Temp: 98.3 F (36.8 C)  TempSrc: Oral   There is no height or weight on file to calculate BMI. Physical Exam  Constitutional: She is oriented to person, place, and time. She appears well-developed.  HENT:  Head: Normocephalic.  Mouth/Throat: Oropharynx is clear and moist.  Eyes: Pupils are equal, round, and reactive to light.  Neck: Neck supple.  Cardiovascular: Normal rate, regular rhythm and normal heart sounds.   No murmur heard. Pulmonary/Chest: Effort normal and breath sounds normal. No respiratory distress. She has no wheezes. She has no rales. She exhibits no tenderness.  Abdominal: Soft. Bowel sounds are normal. She exhibits no distension. There is no tenderness. There is no rebound and no guarding.  Musculoskeletal: She exhibits edema.  Neurological: She is alert and oriented to person, place, and time.  Skin: Skin is warm and dry.  Psychiatric: She has a normal mood and affect. Her  behavior is normal. Thought content normal.    Labs reviewed:  Recent Labs  11/19/16 0427 11/24/16 0700 12/01/16 0700  NA 139 142 139  K 3.7 3.2* 4.2  CL 99* 99* 103  CO2 34* 34* 29  GLUCOSE 107* 108* 92  BUN 21* 22* 28*  CREATININE 0.98 0.97 1.14*  CALCIUM 8.4* 8.2* 9.0    Recent Labs  04/15/16 1145  AST 19  ALT 11  ALKPHOS 82  BILITOT 1.4*  PROT 6.5  ALBUMIN 3.7    Recent Labs  11/11/16 2023  11/18/16 0506 11/19/16 0427 11/24/16 0700  WBC 14.8*  < > 12.3* 11.3* 14.4*  NEUTROABS 11.3*  --   --   --  10.1*  HGB 12.9  < > 11.0* 10.7* 10.4*  HCT 38.1  < > 35.0* 34.9* 32.7*  MCV 88.2  < > 89.7 91.4 91.1  PLT 376  < > 330 348 334  < > = values in this interval not displayed. Lab Results  Component Value Date   TSH 5.347 (H) 11/24/2016   No results found for: HGBA1C Lab Results  Component Value Date   CHOL 136 04/15/2016   HDL 42 (L) 04/15/2016   LDLCALC 57 04/15/2016   TRIG 184 (H) 04/15/2016   CHOLHDL 3.2 04/15/2016    Significant Diagnostic Results in last 30 days:  Ct Angio Chest Pe W Or Wo Contrast  Result Date: 11/18/2016 CLINICAL DATA:  Shortness of breath today, chest discomfort. History of knee surgery on 11/14/2016. EXAM: CT ANGIOGRAPHY CHEST WITH CONTRAST TECHNIQUE: Multidetector CT imaging of the chest was performed using the standard protocol during bolus administration of intravenous contrast. Multiplanar CT image reconstructions and MIPs were obtained to evaluate the vascular anatomy. CONTRAST:  58 cc Isovue 370 COMPARISON:  None. FINDINGS: Cardiovascular: There is no pulmonary embolism identified within the main, lobar or segmental pulmonary arteries bilaterally. Scattered atherosclerotic changes of the normal caliber thoracic aorta. No aneurysm or dissection. Mild cardiomegaly. No pericardial effusion. Mediastinum/Nodes: Scattered small lymph nodes within the mediastinum and perihilar regions, not pathologic by CT size criteria. Esophagus is  unremarkable. Trachea and central bronchi are unremarkable. Lungs/Pleura: Scattered ground-glass opacities bilaterally, most likely mild edema. Small bilateral pleural effusions with adjacent atelectasis. No evidence of pneumonia. No pneumothorax. Upper Abdomen: No acute findings. Musculoskeletal: Mild degenerative change within the thoracic spine. No acute or suspicious osseous finding. Superficial soft tissues are unremarkable. Review of the MIP images confirms the above findings. IMPRESSION: 1. No  pulmonary embolism identified. 2. Mild cardiomegaly. Scattered ground-glass opacities bilaterally, most likely mild edema related to mild CHF/volume overload. 3. Small bilateral pleural effusions with adjacent atelectasis. 4. Aortic atherosclerosis. Electronically Signed   By: Franki Cabot M.D.   On: 11/18/2016 17:29    Assessment/Plan  Urinary retention ? Bladder inactivity sec to immobility and post surgical. Her UA and culture were negative. She did void today but is still having problems with completely emptying bladder. Will start her on Flomax. She also has appointment with Urology pending. Also Schedule voiding on Bedside commode Has slightly increase in creat. 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 Family/ staff Communication:   Labs/tests ordered:  BMP, CBC

## 2016-12-03 ENCOUNTER — Encounter (INDEPENDENT_AMBULATORY_CARE_PROVIDER_SITE_OTHER): Payer: Self-pay | Admitting: Physician Assistant

## 2016-12-03 ENCOUNTER — Ambulatory Visit (INDEPENDENT_AMBULATORY_CARE_PROVIDER_SITE_OTHER): Payer: PPO | Admitting: Physician Assistant

## 2016-12-03 DIAGNOSIS — Z96659 Presence of unspecified artificial knee joint: Secondary | ICD-10-CM

## 2016-12-03 DIAGNOSIS — T8459XS Infection and inflammatory reaction due to other internal joint prosthesis, sequela: Secondary | ICD-10-CM

## 2016-12-03 LAB — T3: T3, Total: 134 ng/dL (ref 71–180)

## 2016-12-03 NOTE — Progress Notes (Signed)
Mrs. Caison returns today 2 weeks status post excision left total knee components with placement of antibiotic spacer. She remains on IV antibiotics Cefepime . Cultures were negative for growth. She denies any chest pain shortness breath fevers chills. She feels that she is making progress with physical therapy pain is rated 3 out of 10 pain most of the time. She's taken hydrocodone as needed for pain. She continues to use knee immobilizer when ambulating.  Physical exam: General well-developed well-nourished female in no acute distress. Left lower extremity: Calf supple nontender. No erythema or abnormal warmth left lower leg. Surgical incisions well approximated staples no signs of infection.  Impression : Status post excision left total knee arthroplasty components with placement of antibiotic spacer 19 days postop.  Plan Staples removed Steri-Strips applied she is able to get the incision wet and shower. Continue to work with physical therapy will place her touchdown weightbearing left leg in the immobilizer. Follow with Dr. Ninfa Linden in 1 month.

## 2016-12-04 ENCOUNTER — Non-Acute Institutional Stay (SKILLED_NURSING_FACILITY): Payer: PPO | Admitting: Internal Medicine

## 2016-12-04 ENCOUNTER — Encounter: Payer: Self-pay | Admitting: Internal Medicine

## 2016-12-04 ENCOUNTER — Encounter (HOSPITAL_COMMUNITY)
Admission: RE | Admit: 2016-12-04 | Discharge: 2016-12-04 | Disposition: A | Discharge status: Home / Self Care | Payer: PPO | Source: Skilled Nursing Facility | Attending: Internal Medicine | Admitting: Internal Medicine

## 2016-12-04 DIAGNOSIS — N289 Disorder of kidney and ureter, unspecified: Secondary | ICD-10-CM | POA: Diagnosis not present

## 2016-12-04 DIAGNOSIS — E785 Hyperlipidemia, unspecified: Secondary | ICD-10-CM | POA: Insufficient documentation

## 2016-12-04 DIAGNOSIS — R339 Retention of urine, unspecified: Secondary | ICD-10-CM

## 2016-12-04 DIAGNOSIS — G500 Trigeminal neuralgia: Secondary | ICD-10-CM | POA: Insufficient documentation

## 2016-12-04 DIAGNOSIS — N1330 Unspecified hydronephrosis: Secondary | ICD-10-CM | POA: Insufficient documentation

## 2016-12-04 DIAGNOSIS — G9009 Other idiopathic peripheral autonomic neuropathy: Secondary | ICD-10-CM | POA: Insufficient documentation

## 2016-12-04 DIAGNOSIS — F458 Other somatoform disorders: Secondary | ICD-10-CM | POA: Insufficient documentation

## 2016-12-04 DIAGNOSIS — Z4733 Aftercare following explantation of knee joint prosthesis: Secondary | ICD-10-CM | POA: Insufficient documentation

## 2016-12-04 DIAGNOSIS — F411 Generalized anxiety disorder: Secondary | ICD-10-CM | POA: Insufficient documentation

## 2016-12-04 LAB — BASIC METABOLIC PANEL
Anion gap: 11 (ref 5–15)
BUN: 33 mg/dL — AB (ref 6–20)
CO2: 24 mmol/L (ref 22–32)
CREATININE: 1.42 mg/dL — AB (ref 0.44–1.00)
Calcium: 9.3 mg/dL (ref 8.9–10.3)
Chloride: 102 mmol/L (ref 101–111)
GFR calc Af Amer: 40 mL/min — ABNORMAL LOW (ref >60)
GFR, EST NON AFRICAN AMERICAN: 35 mL/min — AB (ref >60)
Glucose, Bld: 104 mg/dL — ABNORMAL HIGH (ref 65–99)
Potassium: 4.4 mmol/L (ref 3.5–5.1)
SODIUM: 137 mmol/L (ref 135–145)

## 2016-12-04 NOTE — Progress Notes (Signed)
This is an acute visit.  Level care skilled.  Facility is [facility].  Chief complaint acute visit follow-up renal insufficiency.  History of present illness.  Patient is a pleasant 75 year old female with a history ofleft total Knee replacement in 2001 who was admitted in hospital with worsening Left knee pain. She was treated with IV Antibiotics.She underwent Excisional Left Total Knee Arthroplasty with Placement of Antibiotic Spacer on 11/11/16. She is on IV Cefepime for 4 weeks till 06/20. Initially when she came in the facility she had trouble voiding and needed a chronic Foley catheter.  Dr. Lyndel Safe saw her 2 days ago after catheter was removed and she complained of some bleeding from the insertion site.  With some initial difficulty voiding but apparently this got better during the day.  She was started on Flomax and she has an appointment to urology pending.  Since then and she says she's been voiding quite well and appears to be quite happy with how this is working.  Her creatinine was rising slightly up to 1.14-and lab work was ordered to day to follow-up on this her creatinine now is 1.42 with a BUN of 33.  In per chart review appears previous creatinine 10 days ago was 0.97--however it appears that 2 weeks ago her creatinine actually was 2.58 BUN of 40 8I did do a chart review but could not really find what the diagnosis was although I suspect she was rehydrated -nonetheless he did normalize it appeared over a period of several days  She is on low dose Hyzaar otherwise in not on a diuretic. 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 Anxiety   . Arthritis   . Complication of anesthesia 2012   "irritated trachea" with cough x 1 year from intubation  . Esophageal hernia   . Fibromyalgia   . HTN (hypertension)   . Hx of colonic polyps   . Hydronephrosis of right kidney   . Hypercholesterolemia   . 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 Obesity   . 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 Renal angiomyolipoma         Past Surgical History:  Procedure Laterality Date  . ABDOMINAL HYSTERECTOMY  1969  . BACK SURGERY  2012   rod in back-  lumbar disc removal with bone graft  . blephoroplasty     bilateral  . BREAST BIOPSY  1989  . CHOLECYSTECTOMY  2010  . EXCISIONAL TOTAL KNEE ARTHROPLASTY WITH ANTIBIOTIC SPACERS Left 11/14/2016   Procedure: EXCISIONAL LEFT TOTAL KNEE ARTHROPLASTY WITH  PLACEMENT OF ANTIBIOTIC SPACERS;  Surgeon: [name], MD;  Location: [facility];  Service: Orthopedics;  Laterality: Left;  . NISSEN FUNDOPLICATION    . PARAESOPHAGEAL HERNIA REPAIR  1989  . TONSILLECTOMY  1946  . TOTAL HIP ARTHROPLASTY Right 08/31/2012   Procedure: TOTAL HIP ARTHROPLASTY ANTERIOR APPROACH;  Surgeon: Mauri Pole, MD;  Location: [facility];  Service: Orthopedics;  Laterality: Right;  . TOTAL KNEE ARTHROPLASTY  2001   left  . TOTAL KNEE ARTHROPLASTY  2010   right         Allergies  Allergen Reactions  . Epinephrine Other (See Comments)    Reaction:  Increased pts BP and caused shoulder to jerk   . Levofloxacin Nausea And Vomiting  . Lipitor [Atorvastatin] Other (See Comments)    Reaction:  Joint pain and dizziness   . Trovan [Alatrofloxacin] Nausea And Vomiting    Pt states that this med caused pancreatitis.     Review of Systems  Constitutional: Negative for activity change, appetite change, chills, diaphoresis, fatigue and fever.Has lost weight  which is desired  HENT: Negative for mouth sores, postnasal drip, rhinorrhea, sinus pain and sore throat.   Respiratory: Negative for apnea, cough, chest tightness, shortness of breath and wheezing.   Cardiovascular: Negative for chest pain, palpitations and leg swelling. Edema has improved Gastrointestinal: Negative for abdominal distention, abdominal pain, constipation, diarrhea, nausea and vomiting.  Genitourinary: Negative for dysuria and frequency.--Says she is voiding well now status post Foley catheter initially had complain with pain when he was happening when catheter was originally removed but this has resolved  Musculoskeletal: Negative for arthralgias, joint swelling and myalgias.  Skin: Negative for rash.  Neurological: Negative for dizziness, syncope, weakness, light-headedness and numbness.  Psychiatric/Behavioral: Negative for behavioral problems, confusion and sleep disturbance.        Outpatient Encounter Prescriptions as of 12/02/2016  Medication Sig  . ALPRAZolam (XANAX) 0.25 MG tablet Take 0.25 mg by mouth at bedtime as needed for anxiety.  Marland Kitchen atenolol (TENORMIN) 25 MG tablet TAKE 1 TABLET BY MOUTH DAILY.  . Calcium Carbonate-Vitamin D (CALCIUM 600+D) 600-400 MG-UNIT tablet Take 1 tablet by mouth every other day.  . ceFEPIme 2 g in dextrose 5 % 50 mL Inject 2 g into the vein every 12 (twelve) hours.  . docusate sodium (COLACE) 100 MG capsule Take 1 capsule (100 mg total) by mouth 2 (two) times daily.  . folic acid (FOLVITE) 716 MCG tablet Take 400 mcg by mouth daily.  Marland Kitchen gabapentin (NEURONTIN) 300 MG capsule Take 2 capsules (600 mg total) by mouth 3 (three) times daily.  Marland Kitchen HYDROcodone-acetaminophen (NORCO/VICODIN) 5-325 MG tablet Take 1 tablet by mouth every 6 (six) hours as needed for moderate pain. DO NOT EXCEED 3GM OF APAP IN 24 HOURS FROM ALL SOURCES  . losartan-hydrochlorothiazide (HYZAAR) 50-12.5 MG tablet Take 1 tablet by mouth daily.  . methocarbamol (ROBAXIN) 500  MG tablet Take 500 mg by mouth 3 (three) times daily. For 14 days stop on 12/07/2016  . Multiple Vitamin (MULTIVITAMIN WITH MINERALS) TABS tablet Take 1 tablet by mouth every other day.  . potassium chloride SA (K-DUR,KLOR-CON) 20 MEQ tablet Take 20 mEq by mouth daily.  . promethazine (PHENERGAN) 25 MG tablet Take 25 mg by mouth every 8 (eight) hours as needed for nausea or vomiting.   . rivaroxaban (XARELTO) 10 MG TABS tablet Take 1 tablet (10 mg total) by mouth daily.  . rosuvastatin (CRESTOR) 10 MG tablet Take 1 tablet (10 mg total) by mouth daily.   No facility-administered encounter medications on file as of 12/02/2016.      There is no immunization history on file for this patient.     Pertinent  Health Maintenance Due  Topic Date Due  . DEXA SCAN  05/18/2005  . PNA vac Low Risk Adult (1 of 2 - PCV13) 06/06/2017 (Originally 05/18/2005)  . INFLUENZA VACCINE  02/04/2017  . COLONOSCOPY  01/26/2019   Physical exam.  Temperature is 98.4 pulse 76 respirations 18 blood pressure 124/67 weight is 216.4 pounds.  In general this is a very pleasant LE female in no distress looks somewhat younger than her stated age.  Her skin is warm and dry.  Oropharynx is clear mucous membranes moist.  Chest is clear to auscultation there is no labored breathing.  Heart--regular rate and rhythm without murmur gallop or rub she has mild lower extremity edema I would say trace to 1+ apparently this has significantly improved.  Abdomen is soft nontender with positive bowel sounds.  GU Limited exam since patient is in a wheelchair but could not really appreciate over retention she says she is voiding well.  Neurologic is grossly intact her speech is clear no lateralizing findings.  Psych she is alert and oriented very pleasant and appropriate  Labs.  May 31st 2018.  Sodium 137 potassium 4.4 BUN 33 creatinine 1.42.  3 days ago creatinine was 1.14.   11/19/2016.  WBC 11.3 hemoglobin 10.7  platelets 348    Assessment and plan.  #1 renal insufficiency creatinine has risen slightly up to 1.42 with a BUN of 33-this was discussed with Dr. Lyndel Safe via phone-at this point clinically she appears to be doing quite well-she is voiding she states will update this lab first laboratory day next week and monitor clinically although again she appears stable this may be somewhat of a normal variation but will have to be watched.  She is eating and drinking better she states she says at one point she didn't have much but appetite but apparently this is improving   In fact she had numerous drinks and food on the table when I was in the room  \Regards urinary retention she is on Flomax and again is followed by urology  #2 mild leukocytosis this appear to be trending down she is continues on antibiotic-will update this first laboratory day next week as well she is afebrile appears to be doing well in this regards   [id number]

## 2016-12-05 DIAGNOSIS — G500 Trigeminal neuralgia: Secondary | ICD-10-CM | POA: Diagnosis not present

## 2016-12-05 DIAGNOSIS — M25562 Pain in left knee: Secondary | ICD-10-CM | POA: Diagnosis not present

## 2016-12-05 DIAGNOSIS — Z4733 Aftercare following explantation of knee joint prosthesis: Secondary | ICD-10-CM | POA: Diagnosis not present

## 2016-12-05 DIAGNOSIS — I119 Hypertensive heart disease without heart failure: Secondary | ICD-10-CM | POA: Diagnosis not present

## 2016-12-05 DIAGNOSIS — R739 Hyperglycemia, unspecified: Secondary | ICD-10-CM | POA: Diagnosis not present

## 2016-12-05 DIAGNOSIS — E785 Hyperlipidemia, unspecified: Secondary | ICD-10-CM | POA: Diagnosis not present

## 2016-12-05 DIAGNOSIS — R279 Unspecified lack of coordination: Secondary | ICD-10-CM | POA: Diagnosis not present

## 2016-12-05 DIAGNOSIS — M1990 Unspecified osteoarthritis, unspecified site: Secondary | ICD-10-CM | POA: Diagnosis not present

## 2016-12-05 DIAGNOSIS — F411 Generalized anxiety disorder: Secondary | ICD-10-CM | POA: Diagnosis not present

## 2016-12-05 DIAGNOSIS — R262 Difficulty in walking, not elsewhere classified: Secondary | ICD-10-CM | POA: Diagnosis not present

## 2016-12-05 DIAGNOSIS — R12 Heartburn: Secondary | ICD-10-CM | POA: Diagnosis not present

## 2016-12-05 DIAGNOSIS — M542 Cervicalgia: Secondary | ICD-10-CM | POA: Diagnosis not present

## 2016-12-05 DIAGNOSIS — G9009 Other idiopathic peripheral autonomic neuropathy: Secondary | ICD-10-CM | POA: Diagnosis not present

## 2016-12-05 DIAGNOSIS — F458 Other somatoform disorders: Secondary | ICD-10-CM | POA: Diagnosis not present

## 2016-12-05 DIAGNOSIS — R110 Nausea: Secondary | ICD-10-CM | POA: Diagnosis not present

## 2016-12-05 DIAGNOSIS — N1330 Unspecified hydronephrosis: Secondary | ICD-10-CM | POA: Diagnosis not present

## 2016-12-05 DIAGNOSIS — M6281 Muscle weakness (generalized): Secondary | ICD-10-CM | POA: Diagnosis not present

## 2016-12-05 DIAGNOSIS — L03116 Cellulitis of left lower limb: Secondary | ICD-10-CM | POA: Diagnosis not present

## 2016-12-08 ENCOUNTER — Encounter: Payer: Self-pay | Admitting: Internal Medicine

## 2016-12-08 ENCOUNTER — Non-Acute Institutional Stay (SKILLED_NURSING_FACILITY): Payer: PPO | Admitting: Internal Medicine

## 2016-12-08 ENCOUNTER — Encounter (HOSPITAL_COMMUNITY)
Admission: RE | Admit: 2016-12-08 | Discharge: 2016-12-08 | Disposition: A | Discharge status: Home / Self Care | Payer: PPO | Source: Skilled Nursing Facility | Attending: Internal Medicine | Admitting: Internal Medicine

## 2016-12-08 DIAGNOSIS — Z96659 Presence of unspecified artificial knee joint: Secondary | ICD-10-CM | POA: Diagnosis not present

## 2016-12-08 DIAGNOSIS — I10 Essential (primary) hypertension: Secondary | ICD-10-CM

## 2016-12-08 DIAGNOSIS — T8459XS Infection and inflammatory reaction due to other internal joint prosthesis, sequela: Secondary | ICD-10-CM | POA: Diagnosis not present

## 2016-12-08 DIAGNOSIS — G500 Trigeminal neuralgia: Secondary | ICD-10-CM | POA: Insufficient documentation

## 2016-12-08 DIAGNOSIS — N1330 Unspecified hydronephrosis: Secondary | ICD-10-CM | POA: Diagnosis not present

## 2016-12-08 DIAGNOSIS — F458 Other somatoform disorders: Secondary | ICD-10-CM | POA: Insufficient documentation

## 2016-12-08 DIAGNOSIS — R339 Retention of urine, unspecified: Secondary | ICD-10-CM

## 2016-12-08 DIAGNOSIS — N289 Disorder of kidney and ureter, unspecified: Secondary | ICD-10-CM

## 2016-12-08 DIAGNOSIS — G9009 Other idiopathic peripheral autonomic neuropathy: Secondary | ICD-10-CM | POA: Insufficient documentation

## 2016-12-08 DIAGNOSIS — F411 Generalized anxiety disorder: Secondary | ICD-10-CM | POA: Insufficient documentation

## 2016-12-08 DIAGNOSIS — Z4733 Aftercare following explantation of knee joint prosthesis: Secondary | ICD-10-CM | POA: Insufficient documentation

## 2016-12-08 DIAGNOSIS — E785 Hyperlipidemia, unspecified: Secondary | ICD-10-CM | POA: Insufficient documentation

## 2016-12-08 LAB — CBC WITH DIFFERENTIAL/PLATELET
Basophils Absolute: 0.1 10*3/uL (ref 0.0–0.1)
Basophils Relative: 1 %
EOS ABS: 0.7 10*3/uL (ref 0.0–0.7)
Eosinophils Relative: 9 %
HCT: 35.2 % — ABNORMAL LOW (ref 36.0–46.0)
HEMOGLOBIN: 11.2 g/dL — AB (ref 12.0–15.0)
LYMPHS ABS: 1.6 10*3/uL (ref 0.7–4.0)
Lymphocytes Relative: 21 %
MCH: 28.6 pg (ref 26.0–34.0)
MCHC: 31.8 g/dL (ref 30.0–36.0)
MCV: 89.8 fL (ref 78.0–100.0)
MONOS PCT: 13 %
Monocytes Absolute: 1 10*3/uL (ref 0.1–1.0)
Neutro Abs: 4.2 10*3/uL (ref 1.7–7.7)
Neutrophils Relative %: 56 %
Platelets: 224 10*3/uL (ref 150–400)
RBC: 3.92 MIL/uL (ref 3.87–5.11)
RDW: 14.7 % (ref 11.5–15.5)
WBC: 7.6 10*3/uL (ref 4.0–10.5)

## 2016-12-08 LAB — BASIC METABOLIC PANEL
Anion gap: 10 (ref 5–15)
BUN: 45 mg/dL — AB (ref 6–20)
CHLORIDE: 107 mmol/L (ref 101–111)
CO2: 24 mmol/L (ref 22–32)
CREATININE: 2.02 mg/dL — AB (ref 0.44–1.00)
Calcium: 9.4 mg/dL (ref 8.9–10.3)
GFR calc Af Amer: 26 mL/min — ABNORMAL LOW (ref >60)
GFR calc non Af Amer: 23 mL/min — ABNORMAL LOW (ref >60)
GLUCOSE: 100 mg/dL — AB (ref 65–99)
Potassium: 4.7 mmol/L (ref 3.5–5.1)
SODIUM: 141 mmol/L (ref 135–145)

## 2016-12-08 NOTE — Progress Notes (Addendum)
Location:   [facility] Room Number: 128/P Place of Service:  SNF (31) Provider:  Charyl Bigger, Denice Bors, MD  Patient Care Team: Lelon Perla, MD as PCP - General (Cardiology)  Extended Emergency Contact Information Primary Emergency Contact: Tish Frederickson, [place] [address] [name] of [place] Phone: [phone] Relation: Son Secondary Emergency Contact: Mosetta Pigeon [place] of [place] Mobile Phone: [phone] Relation: Niece  Code Status:  Full Code Goals of care: Advanced Directive information Advanced Directives 12/08/2016  Does Patient Have a Medical Advance Directive? Yes  Type of Advance Directive (No Data)  Does patient want to make changes to medical advance directive? No - Patient declined  Would patient like information on creating a medical advance directive? No - Patient declined  Pre-existing out of facility DNR order (yellow form or pink MOST form) -     Chief Complaint  Patient presents with  . Acute Visit    Worsening renal Function    HPI:  Pt is a 76 y.o. female seen today for an acute visit for Worsening Renal function.  Patient has h/o Hypertension and left total Knee replacement in 2001 who was admitted in hospital with worsening Left knee pain. She was treated with IV Antibiotics.She underwent Excisional Left Total Knee Arthroplasty with Placement of Antibiotic Spacer on 11/11/16. She is on IV Cefepime for 4 weeks till 06/20. Patient had problems with urinary retention requiring Foley catheter. But then it was removed and started on Flomax. She has done well with voiding but Her creat has been slowly going up and today it is up to 2.02 from 1.4 . Patient is feeling well. She does have some nausea. But denies any vomiting or chest pain or diarrhea. No cough Or SOB. She denies any Dysuria. Her weight is down 20 lbs since she has been here. 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 Was suppose to follow with him but never did.  Past Medical History:  Diagnosis Date  . Anxiety   . Arthritis   . Complication of anesthesia 2012   "irritated trachea" with cough x 1 year from intubation  . Esophageal hernia   . Fibromyalgia   . HTN (hypertension)   . Hx of colonic polyps   . Hydronephrosis of right kidney   . Hypercholesterolemia   . 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 Obesity   . 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 Renal angiomyolipoma    Past Surgical History:  Procedure Laterality Date  . ABDOMINAL HYSTERECTOMY  1969  . BACK SURGERY  2012   rod in back-  lumbar disc removal with bone graft  . blephoroplasty     bilateral  . BREAST BIOPSY  1989  . CHOLECYSTECTOMY  2010  . EXCISIONAL TOTAL KNEE ARTHROPLASTY WITH ANTIBIOTIC SPACERS Left 11/14/2016   Procedure: EXCISIONAL LEFT TOTAL KNEE ARTHROPLASTY WITH  PLACEMENT OF ANTIBIOTIC SPACERS;  Surgeon: [name], MD;  Location: [facility];  Service: Orthopedics;  Laterality: Left;  . NISSEN FUNDOPLICATION    . PARAESOPHAGEAL HERNIA REPAIR  1989  . TONSILLECTOMY  1946  . TOTAL HIP ARTHROPLASTY Right 08/31/2012   Procedure: TOTAL HIP ARTHROPLASTY ANTERIOR APPROACH;  Surgeon: Mauri Pole, MD;  Location: [facility];  Service: Orthopedics;  Laterality: Right;  .  TOTAL KNEE ARTHROPLASTY  2001   left  . TOTAL KNEE ARTHROPLASTY  2010   right    Allergies  Allergen Reactions  . Epinephrine Other (See Comments)    Reaction:  Increased pts BP and caused shoulder to jerk   . Levofloxacin Nausea And Vomiting  . Lipitor [Atorvastatin] Other (See Comments)    Reaction:  Joint pain and dizziness   . Trovan [Alatrofloxacin] Nausea And Vomiting    Pt states that this med caused pancreatitis.    Outpatient Encounter Prescriptions as of 12/08/2016  Medication Sig  . atenolol (TENORMIN) 25 MG tablet TAKE 1  TABLET BY MOUTH DAILY.  . Calcium Carbonate-Vitamin D (CALCIUM 600+D) 600-400 MG-UNIT tablet Take 1 tablet by mouth every other day.  . ceFEPIme 2 g in dextrose 5 % 50 mL Inject 2 g into the vein every 12 (twelve) hours.  . docusate sodium (COLACE) 100 MG capsule Take 1 capsule (100 mg total) by mouth 2 (two) times daily.  . folic acid (FOLVITE) 937 MCG tablet Take 400 mcg by mouth daily.  Marland Kitchen gabapentin (NEURONTIN) 300 MG capsule Take 2 capsules (600 mg total) by mouth 3 (three) times daily.  Marland Kitchen HYDROcodone-acetaminophen (NORCO/VICODIN) 5-325 MG tablet Take 1 tablet by mouth every 6 (six) hours as needed for moderate pain. DO NOT EXCEED 3GM OF APAP IN 24 HOURS FROM ALL SOURCES  . losartan-hydrochlorothiazide (HYZAAR) 50-12.5 MG tablet Take 1 tablet by mouth daily.  . Multiple Vitamin (MULTIVITAMIN WITH MINERALS) TABS tablet Take 1 tablet by mouth every other day.  . potassium chloride SA (K-DUR,KLOR-CON) 20 MEQ tablet Take 20 mEq by mouth daily.  . promethazine (PHENERGAN) 25 MG tablet Take 25 mg by mouth every 8 (eight) hours as needed for nausea or vomiting.   . rivaroxaban (XARELTO) 10 MG TABS tablet Take 1 tablet (10 mg total) by mouth daily.  . rosuvastatin (CRESTOR) 10 MG tablet Take 1 tablet (10 mg total) by mouth daily.  . tamsulosin (FLOMAX) 0.4 MG CAPS capsule Take 0.4 mg by mouth every evening.  . [DISCONTINUED] ALPRAZolam (XANAX) 0.25 MG tablet Take 0.25 mg by mouth at bedtime as needed for anxiety.  . [DISCONTINUED] methocarbamol (ROBAXIN) 500 MG tablet Take 500 mg by mouth 3 (three) times daily. For 14 days stop on 12/07/2016   No facility-administered encounter medications on file as of 12/08/2016.      Review of Systems  Review of Systems  Constitutional: Negative for activity change, appetite change, chills, diaphoresis, fatigue and fever.  HENT: Negative for mouth sores, postnasal drip, rhinorrhea, sinus pain and sore throat.   Respiratory: Negative for apnea, cough, chest  tightness, shortness of breath and wheezing.   Cardiovascular: Negative for chest pain, palpitations and leg swelling.  Gastrointestinal: Negative for abdominal distention, abdominal pain, constipation, diarrhea, nausea and vomiting.  Genitourinary: Negative for dysuria and frequency.  Musculoskeletal: Negative for arthralgias, joint swelling and myalgias.  Skin: Negative for rash.  Neurological: Negative for dizziness, syncope, weakness, light-headedness and numbness.  Psychiatric/Behavioral: Negative for behavioral problems, confusion and sleep disturbance.      There is no immunization history on file for this patient. Pertinent  Health Maintenance Due  Topic Date Due  . DEXA SCAN  05/18/2005  . PNA vac Low Risk Adult (1 of 2 - PCV13) 06/06/2017 (Originally 05/18/2005)  . INFLUENZA VACCINE  02/04/2017  . COLONOSCOPY  01/26/2019   No flowsheet data found. Functional Status Survey:    Vitals:   12/08/16 1630  BP: 123/61  Pulse: 65  Resp: 18  Temp: 97.6 F (36.4 C)   There is no height or weight on file to calculate BMI. Physical Exam  Constitutional: She is oriented to person, place, and time. She appears well-developed and well-nourished.  HENT:  Head: Normocephalic.  Mouth/Throat: Oropharynx is clear and moist.  Eyes: Pupils are equal, round, and reactive to light.  Neck: Neck supple.  Cardiovascular: Normal rate, regular rhythm and normal heart sounds.   No murmur heard. Pulmonary/Chest: Effort normal and breath sounds normal. No respiratory distress. She has no wheezes. She has no rales.  Abdominal: Soft. Bowel sounds are normal. She exhibits no distension. There is no tenderness. There is no rebound.  Musculoskeletal:  Edema in both LE  Neurological: She is alert and oriented to person, place, and time.  Skin: Skin is warm and dry. No rash noted. No erythema.  Psychiatric: She has a normal mood and affect. Her behavior is normal. Thought content normal.     Labs reviewed:  Recent Labs  12/01/16 0700 12/04/16 1050 12/08/16 0723  NA 139 137 141  K 4.2 4.4 4.7  CL 103 102 107  CO2 29 24 24   GLUCOSE 92 104* 100*  BUN 28* 33* 45*  CREATININE 1.14* 1.42* 2.02*  CALCIUM 9.0 9.3 9.4    Recent Labs  04/15/16 1145  AST 19  ALT 11  ALKPHOS 82  BILITOT 1.4*  PROT 6.5  ALBUMIN 3.7    Recent Labs  11/11/16 2023  11/19/16 0427 11/24/16 0700 12/08/16 0723  WBC 14.8*  < > 11.3* 14.4* 7.6  NEUTROABS 11.3*  --   --  10.1* 4.2  HGB 12.9  < > 10.7* 10.4* 11.2*  HCT 38.1  < > 34.9* 32.7* 35.2*  MCV 88.2  < > 91.4 91.1 89.8  PLT 376  < > 348 334 224  < > = values in this interval not displayed. Lab Results  Component Value Date   TSH 5.347 (H) 11/24/2016   No results found for: HGBA1C Lab Results  Component Value Date   CHOL 136 04/15/2016   HDL 42 (L) 04/15/2016   LDLCALC 57 04/15/2016   TRIG 184 (H) 04/15/2016   CHOLHDL 3.2 04/15/2016    Significant Diagnostic Results in last 30 days:  Ct Angio Chest Pe W Or Wo Contrast  Result Date: 11/18/2016 CLINICAL DATA:  Shortness of breath today, chest discomfort. History of knee surgery on 11/14/2016. EXAM: CT ANGIOGRAPHY CHEST WITH CONTRAST TECHNIQUE: Multidetector CT imaging of the chest was performed using the standard protocol during bolus administration of intravenous contrast. Multiplanar CT image reconstructions and MIPs were obtained to evaluate the vascular anatomy. CONTRAST:  58 cc Isovue 370 COMPARISON:  None. FINDINGS: Cardiovascular: There is no pulmonary embolism identified within the main, lobar or segmental pulmonary arteries bilaterally. Scattered atherosclerotic changes of the normal caliber thoracic aorta. No aneurysm or dissection. Mild cardiomegaly. No pericardial effusion. Mediastinum/Nodes: Scattered small lymph nodes within the mediastinum and perihilar regions, not pathologic by CT size criteria. Esophagus is unremarkable. Trachea and central bronchi are  unremarkable. Lungs/Pleura: Scattered ground-glass opacities bilaterally, most likely mild edema. Small bilateral pleural effusions with adjacent atelectasis. No evidence of pneumonia. No pneumothorax. Upper Abdomen: No acute findings. Musculoskeletal: Mild degenerative change within the thoracic spine. No acute or suspicious osseous finding. Superficial soft tissues are unremarkable. Review of the MIP images confirms the above findings. IMPRESSION: 1. No pulmonary embolism identified. 2. Mild cardiomegaly. Scattered ground-glass opacities bilaterally, most likely mild edema  related to mild CHF/volume overload. 3. Small bilateral pleural effusions with adjacent atelectasis. 4. Aortic atherosclerosis. Electronically Signed   By: Franki Cabot M.D.   On: 11/18/2016 17:29    Assessment/Plan  Acute renal Insufficiency Her creat was 1.4 and now is 2.02. Her BUN is up to 45. Will Hold Hyzaar D/C Potassium Encourage PO fluids Vitals Q Shift to monitor BP Renal US Patient has h/o Right Renal hydronephrosis. decrease the dose of Cefepime as d/w Pharmacist. Will need renal Consult if Creat doesn,t improve. In and Out Cath after Voiding to check PVR     Infected prosthetic knee joint Doing well Has appointment With Ortho. Dose of Antibiotics reduced till GFR improves.  Essential hypertension BP stable Will monitor closely on tenormin.    Family/ staff Communication:   Labs/tests ordered:  BMP in Am Total time spent in this patient care encounter was 35_ minutes; greater than 50% of the visit spent counseling patient and coordinating care for problems addressed at this encounter.

## 2016-12-09 ENCOUNTER — Encounter (HOSPITAL_COMMUNITY)
Admission: RE | Admit: 2016-12-09 | Discharge: 2016-12-09 | Disposition: A | Discharge status: Home / Self Care | Payer: PPO | Source: Skilled Nursing Facility | Attending: Internal Medicine | Admitting: Internal Medicine

## 2016-12-09 DIAGNOSIS — G9009 Other idiopathic peripheral autonomic neuropathy: Secondary | ICD-10-CM | POA: Insufficient documentation

## 2016-12-09 DIAGNOSIS — G500 Trigeminal neuralgia: Secondary | ICD-10-CM | POA: Insufficient documentation

## 2016-12-09 DIAGNOSIS — F411 Generalized anxiety disorder: Secondary | ICD-10-CM | POA: Insufficient documentation

## 2016-12-09 DIAGNOSIS — Z4733 Aftercare following explantation of knee joint prosthesis: Secondary | ICD-10-CM | POA: Insufficient documentation

## 2016-12-09 DIAGNOSIS — E785 Hyperlipidemia, unspecified: Secondary | ICD-10-CM | POA: Insufficient documentation

## 2016-12-09 DIAGNOSIS — F458 Other somatoform disorders: Secondary | ICD-10-CM | POA: Insufficient documentation

## 2016-12-09 LAB — URINALYSIS, ROUTINE W REFLEX MICROSCOPIC
Bilirubin Urine: NEGATIVE
GLUCOSE, UA: NEGATIVE mg/dL
Ketones, ur: NEGATIVE mg/dL
LEUKOCYTES UA: NEGATIVE
Nitrite: NEGATIVE
Protein, ur: 100 mg/dL — AB
SPECIFIC GRAVITY, URINE: 1.011 (ref 1.005–1.030)
pH: 5 (ref 5.0–8.0)

## 2016-12-09 LAB — CBC WITH DIFFERENTIAL/PLATELET
BASOS PCT: 1 %
Basophils Absolute: 0.1 10*3/uL (ref 0.0–0.1)
EOS PCT: 9 %
Eosinophils Absolute: 0.7 10*3/uL (ref 0.0–0.7)
HCT: 30.8 % — ABNORMAL LOW (ref 36.0–46.0)
Hemoglobin: 10 g/dL — ABNORMAL LOW (ref 12.0–15.0)
Lymphocytes Relative: 25 %
Lymphs Abs: 1.8 10*3/uL (ref 0.7–4.0)
MCH: 29 pg (ref 26.0–34.0)
MCHC: 32.5 g/dL (ref 30.0–36.0)
MCV: 89.3 fL (ref 78.0–100.0)
MONO ABS: 0.9 10*3/uL (ref 0.1–1.0)
Monocytes Relative: 12 %
NEUTROS ABS: 3.9 10*3/uL (ref 1.7–7.7)
Neutrophils Relative %: 53 %
PLATELETS: 231 10*3/uL (ref 150–400)
RBC: 3.45 MIL/uL — ABNORMAL LOW (ref 3.87–5.11)
RDW: 14.5 % (ref 11.5–15.5)
WBC: 7.3 10*3/uL (ref 4.0–10.5)

## 2016-12-09 LAB — BASIC METABOLIC PANEL
ANION GAP: 9 (ref 5–15)
BUN: 45 mg/dL — AB (ref 6–20)
CALCIUM: 9 mg/dL (ref 8.9–10.3)
CO2: 23 mmol/L (ref 22–32)
Chloride: 105 mmol/L (ref 101–111)
Creatinine, Ser: 2.06 mg/dL — ABNORMAL HIGH (ref 0.44–1.00)
GFR calc Af Amer: 26 mL/min — ABNORMAL LOW (ref >60)
GFR, EST NON AFRICAN AMERICAN: 22 mL/min — AB (ref >60)
GLUCOSE: 96 mg/dL (ref 65–99)
Potassium: 4.6 mmol/L (ref 3.5–5.1)
SODIUM: 137 mmol/L (ref 135–145)

## 2016-12-09 NOTE — Progress Notes (Signed)
This encounter was created in error - please disregard.

## 2016-12-11 LAB — URINE CULTURE: Culture: NO GROWTH

## 2016-12-15 ENCOUNTER — Emergency Department (HOSPITAL_COMMUNITY): Payer: PPO

## 2016-12-15 ENCOUNTER — Encounter (HOSPITAL_COMMUNITY)
Admission: RE | Admit: 2016-12-15 | Discharge: 2016-12-15 | Disposition: A | Discharge status: Home / Self Care | Payer: PPO | Source: Skilled Nursing Facility | Attending: *Deleted | Admitting: *Deleted

## 2016-12-15 ENCOUNTER — Inpatient Hospital Stay (HOSPITAL_COMMUNITY)
Admission: EM | Admit: 2016-12-15 | Discharge: 2017-01-01 | DRG: 682 | Disposition: A | Discharge status: Skilled Nursing Facility | Payer: PPO | Attending: Internal Medicine | Admitting: Internal Medicine

## 2016-12-15 ENCOUNTER — Encounter: Payer: Self-pay | Admitting: Internal Medicine

## 2016-12-15 ENCOUNTER — Encounter (HOSPITAL_COMMUNITY): Payer: Self-pay | Admitting: Emergency Medicine

## 2016-12-15 ENCOUNTER — Encounter (HOSPITAL_COMMUNITY): Payer: Self-pay | Admitting: Family Medicine

## 2016-12-15 ENCOUNTER — Non-Acute Institutional Stay (SKILLED_NURSING_FACILITY): Payer: PPO | Admitting: Internal Medicine

## 2016-12-15 DIAGNOSIS — R739 Hyperglycemia, unspecified: Secondary | ICD-10-CM | POA: Diagnosis not present

## 2016-12-15 DIAGNOSIS — D649 Anemia, unspecified: Secondary | ICD-10-CM | POA: Diagnosis present

## 2016-12-15 DIAGNOSIS — Z79899 Other long term (current) drug therapy: Secondary | ICD-10-CM | POA: Diagnosis not present

## 2016-12-15 DIAGNOSIS — Y831 Surgical operation with implant of artificial internal device as the cause of abnormal reaction of the patient, or of later complication, without mention of misadventure at the time of the procedure: Secondary | ICD-10-CM | POA: Diagnosis not present

## 2016-12-15 DIAGNOSIS — J918 Pleural effusion in other conditions classified elsewhere: Secondary | ICD-10-CM | POA: Diagnosis not present

## 2016-12-15 DIAGNOSIS — I119 Hypertensive heart disease without heart failure: Secondary | ICD-10-CM | POA: Diagnosis not present

## 2016-12-15 DIAGNOSIS — N170 Acute kidney failure with tubular necrosis: Principal | ICD-10-CM | POA: Diagnosis present

## 2016-12-15 DIAGNOSIS — N179 Acute kidney failure, unspecified: Secondary | ICD-10-CM | POA: Diagnosis not present

## 2016-12-15 DIAGNOSIS — M6281 Muscle weakness (generalized): Secondary | ICD-10-CM | POA: Diagnosis not present

## 2016-12-15 DIAGNOSIS — T8454XS Infection and inflammatory reaction due to internal left knee prosthesis, sequela: Secondary | ICD-10-CM

## 2016-12-15 DIAGNOSIS — L03116 Cellulitis of left lower limb: Secondary | ICD-10-CM | POA: Diagnosis not present

## 2016-12-15 DIAGNOSIS — D1771 Benign lipomatous neoplasm of kidney: Secondary | ICD-10-CM | POA: Diagnosis not present

## 2016-12-15 DIAGNOSIS — N009 Acute nephritic syndrome with unspecified morphologic changes: Secondary | ICD-10-CM | POA: Diagnosis present

## 2016-12-15 DIAGNOSIS — R279 Unspecified lack of coordination: Secondary | ICD-10-CM | POA: Diagnosis not present

## 2016-12-15 DIAGNOSIS — Z6841 Body Mass Index (BMI) 40.0 and over, adult: Secondary | ICD-10-CM | POA: Diagnosis not present

## 2016-12-15 DIAGNOSIS — G9341 Metabolic encephalopathy: Secondary | ICD-10-CM | POA: Diagnosis present

## 2016-12-15 DIAGNOSIS — Z4733 Aftercare following explantation of knee joint prosthesis: Secondary | ICD-10-CM | POA: Diagnosis not present

## 2016-12-15 DIAGNOSIS — E872 Acidosis: Secondary | ICD-10-CM | POA: Diagnosis present

## 2016-12-15 DIAGNOSIS — R0602 Shortness of breath: Secondary | ICD-10-CM

## 2016-12-15 DIAGNOSIS — R293 Abnormal posture: Secondary | ICD-10-CM | POA: Diagnosis not present

## 2016-12-15 DIAGNOSIS — T8459XS Infection and inflammatory reaction due to other internal joint prosthesis, sequela: Secondary | ICD-10-CM

## 2016-12-15 DIAGNOSIS — Z96641 Presence of right artificial hip joint: Secondary | ICD-10-CM | POA: Diagnosis not present

## 2016-12-15 DIAGNOSIS — F411 Generalized anxiety disorder: Secondary | ICD-10-CM | POA: Diagnosis not present

## 2016-12-15 DIAGNOSIS — G9009 Other idiopathic peripheral autonomic neuropathy: Secondary | ICD-10-CM | POA: Diagnosis not present

## 2016-12-15 DIAGNOSIS — R262 Difficulty in walking, not elsewhere classified: Secondary | ICD-10-CM | POA: Diagnosis not present

## 2016-12-15 DIAGNOSIS — R12 Heartburn: Secondary | ICD-10-CM | POA: Diagnosis not present

## 2016-12-15 DIAGNOSIS — I10 Essential (primary) hypertension: Secondary | ICD-10-CM | POA: Diagnosis present

## 2016-12-15 DIAGNOSIS — R4182 Altered mental status, unspecified: Secondary | ICD-10-CM | POA: Diagnosis not present

## 2016-12-15 DIAGNOSIS — T889XXS Complication of surgical and medical care, unspecified, sequela: Secondary | ICD-10-CM | POA: Diagnosis not present

## 2016-12-15 DIAGNOSIS — Z96659 Presence of unspecified artificial knee joint: Secondary | ICD-10-CM | POA: Diagnosis not present

## 2016-12-15 DIAGNOSIS — N184 Chronic kidney disease, stage 4 (severe): Secondary | ICD-10-CM | POA: Diagnosis present

## 2016-12-15 DIAGNOSIS — T82898D Other specified complication of vascular prosthetic devices, implants and grafts, subsequent encounter: Secondary | ICD-10-CM

## 2016-12-15 DIAGNOSIS — G9340 Encephalopathy, unspecified: Secondary | ICD-10-CM

## 2016-12-15 DIAGNOSIS — G629 Polyneuropathy, unspecified: Secondary | ICD-10-CM | POA: Diagnosis not present

## 2016-12-15 DIAGNOSIS — Z881 Allergy status to other antibiotic agents status: Secondary | ICD-10-CM

## 2016-12-15 DIAGNOSIS — F419 Anxiety disorder, unspecified: Secondary | ICD-10-CM | POA: Diagnosis not present

## 2016-12-15 DIAGNOSIS — E8770 Fluid overload, unspecified: Secondary | ICD-10-CM | POA: Diagnosis present

## 2016-12-15 DIAGNOSIS — E875 Hyperkalemia: Secondary | ICD-10-CM | POA: Diagnosis not present

## 2016-12-15 DIAGNOSIS — E785 Hyperlipidemia, unspecified: Secondary | ICD-10-CM | POA: Diagnosis not present

## 2016-12-15 DIAGNOSIS — Z888 Allergy status to other drugs, medicaments and biological substances status: Secondary | ICD-10-CM | POA: Diagnosis not present

## 2016-12-15 DIAGNOSIS — J9601 Acute respiratory failure with hypoxia: Secondary | ICD-10-CM | POA: Diagnosis present

## 2016-12-15 DIAGNOSIS — N189 Chronic kidney disease, unspecified: Secondary | ICD-10-CM | POA: Diagnosis not present

## 2016-12-15 DIAGNOSIS — F458 Other somatoform disorders: Secondary | ICD-10-CM | POA: Diagnosis not present

## 2016-12-15 DIAGNOSIS — Z452 Encounter for adjustment and management of vascular access device: Secondary | ICD-10-CM | POA: Diagnosis not present

## 2016-12-15 DIAGNOSIS — N1330 Unspecified hydronephrosis: Secondary | ICD-10-CM | POA: Diagnosis not present

## 2016-12-15 DIAGNOSIS — G500 Trigeminal neuralgia: Secondary | ICD-10-CM | POA: Diagnosis not present

## 2016-12-15 DIAGNOSIS — M797 Fibromyalgia: Secondary | ICD-10-CM | POA: Diagnosis not present

## 2016-12-15 DIAGNOSIS — M25562 Pain in left knee: Secondary | ICD-10-CM | POA: Diagnosis not present

## 2016-12-15 DIAGNOSIS — I129 Hypertensive chronic kidney disease with stage 1 through stage 4 chronic kidney disease, or unspecified chronic kidney disease: Secondary | ICD-10-CM | POA: Diagnosis present

## 2016-12-15 DIAGNOSIS — M1990 Unspecified osteoarthritis, unspecified site: Secondary | ICD-10-CM | POA: Diagnosis not present

## 2016-12-15 DIAGNOSIS — R110 Nausea: Secondary | ICD-10-CM | POA: Diagnosis not present

## 2016-12-15 DIAGNOSIS — M542 Cervicalgia: Secondary | ICD-10-CM | POA: Diagnosis not present

## 2016-12-15 LAB — URINALYSIS, ROUTINE W REFLEX MICROSCOPIC
BILIRUBIN URINE: NEGATIVE
Glucose, UA: NEGATIVE mg/dL
KETONES UR: NEGATIVE mg/dL
LEUKOCYTES UA: NEGATIVE
NITRITE: NEGATIVE
Protein, ur: 30 mg/dL — AB
Specific Gravity, Urine: 1.011 (ref 1.005–1.030)
pH: 5 (ref 5.0–8.0)

## 2016-12-15 LAB — BASIC METABOLIC PANEL
ANION GAP: 11 (ref 5–15)
ANION GAP: 11 (ref 5–15)
BUN: 65 mg/dL — ABNORMAL HIGH (ref 6–20)
BUN: 64 mg/dL — ABNORMAL HIGH (ref 6–20)
CALCIUM: 8.9 mg/dL (ref 8.9–10.3)
CO2: 21 mmol/L — ABNORMAL LOW (ref 22–32)
CO2: 21 mmol/L — ABNORMAL LOW (ref 22–32)
CREATININE: 4.76 mg/dL — AB (ref 0.44–1.00)
CREATININE: 4.71 mg/dL — AB (ref 0.44–1.00)
Calcium: 8.6 mg/dL — ABNORMAL LOW (ref 8.9–10.3)
Chloride: 104 mmol/L (ref 101–111)
Chloride: 103 mmol/L (ref 101–111)
GFR calc non Af Amer: 8 mL/min — ABNORMAL LOW (ref >60)
GFR calc non Af Amer: 8 mL/min — ABNORMAL LOW (ref >60)
GFR, EST AFRICAN AMERICAN: 9 mL/min — AB (ref >60)
GFR, EST AFRICAN AMERICAN: 9 mL/min — AB (ref >60)
Glucose, Bld: 140 mg/dL — ABNORMAL HIGH (ref 65–99)
Glucose, Bld: 103 mg/dL — ABNORMAL HIGH (ref 65–99)
Potassium: 4.9 mmol/L (ref 3.5–5.1)
Potassium: 5 mmol/L (ref 3.5–5.1)
SODIUM: 136 mmol/L (ref 135–145)
SODIUM: 135 mmol/L (ref 135–145)

## 2016-12-15 LAB — CBC WITH DIFFERENTIAL/PLATELET
BASOS PCT: 1 %
Basophils Absolute: 0.1 10*3/uL (ref 0.0–0.1)
EOS ABS: 0.6 10*3/uL (ref 0.0–0.7)
Eosinophils Relative: 8 %
HEMATOCRIT: 31 % — AB (ref 36.0–46.0)
HEMOGLOBIN: 9.9 g/dL — AB (ref 12.0–15.0)
Lymphocytes Relative: 24 %
Lymphs Abs: 1.9 10*3/uL (ref 0.7–4.0)
MCH: 28.3 pg (ref 26.0–34.0)
MCHC: 31.9 g/dL (ref 30.0–36.0)
MCV: 88.6 fL (ref 78.0–100.0)
Monocytes Absolute: 0.6 10*3/uL (ref 0.1–1.0)
Monocytes Relative: 8 %
NEUTROS ABS: 4.6 10*3/uL (ref 1.7–7.7)
Neutrophils Relative %: 59 %
Platelets: 214 10*3/uL (ref 150–400)
RBC: 3.5 MIL/uL — AB (ref 3.87–5.11)
RDW: 14.8 % (ref 11.5–15.5)
WBC: 7.8 10*3/uL (ref 4.0–10.5)

## 2016-12-15 LAB — MRSA PCR SCREENING: MRSA by PCR: NEGATIVE

## 2016-12-15 MED ORDER — ACETAMINOPHEN 650 MG RE SUPP: 650.0000 mg | Freq: Four times a day (QID) | RECTAL | Status: DC | PRN

## 2016-12-15 MED ORDER — ACETAMINOPHEN 325 MG PO TABS
650.0000 mg | ORAL_TABLET | Freq: Four times a day (QID) | ORAL | Status: DC | PRN
  Administered 2016-12-28: 650 mg via ORAL
  Filled 2016-12-15: qty 2

## 2016-12-15 MED ORDER — DEXTROSE 5 % IV SOLN
1.0000 g | INTRAVENOUS | Status: AC
  Administered 2016-12-16 – 2016-12-24 (×9): 1 g via INTRAVENOUS
  Filled 2016-12-15 (×10): qty 1

## 2016-12-15 MED ORDER — ATENOLOL 25 MG PO TABS
25.0000 mg | ORAL_TABLET | Freq: Every day | ORAL | Status: DC
  Administered 2016-12-16 – 2016-12-22 (×7): 25 mg via ORAL
  Filled 2016-12-15 (×7): qty 1

## 2016-12-15 MED ORDER — HYDROCODONE-ACETAMINOPHEN 5-325 MG PO TABS
1.0000 | ORAL_TABLET | Freq: Four times a day (QID) | ORAL | Status: DC | PRN
  Administered 2016-12-15 – 2016-12-31 (×25): 1 via ORAL
  Filled 2016-12-15 (×26): qty 1

## 2016-12-15 MED ORDER — SODIUM CHLORIDE 0.9 % IV SOLN
INTRAVENOUS | Status: DC
  Administered 2016-12-15 – 2016-12-25 (×20): via INTRAVENOUS

## 2016-12-15 MED ORDER — HEPARIN SODIUM (PORCINE) 5000 UNIT/ML IJ SOLN
5000.0000 [IU] | Freq: Three times a day (TID) | INTRAMUSCULAR | Status: DC
  Administered 2016-12-15 – 2016-12-31 (×43): 5000 [IU] via SUBCUTANEOUS
  Filled 2016-12-15 (×46): qty 1

## 2016-12-15 NOTE — ED Notes (Signed)
US at bedside

## 2016-12-15 NOTE — ED Provider Notes (Signed)
[facility] DEPT Provider Note   CSN: [id number] Arrival date & time: 12/15/16  1201     History   Chief Complaint Chief Complaint  Patient presents with  . Abnormal Lab    HPI Laura Chambers is a 76 y.o. female.  HPI Patient presents from [facility] center with worsening renal function. Creatinine of 4.7 today. Had normal creatinine 2 weeks ago. Had increased to 2 a few days ago and now rechecked. States she is still having urination. She is in the nursing home for IV antibiotics and rehabilitation after left knee surgery. Has a spacer and there due to infection. She is not on NSAIDs. States she has had a mildly decreased appetite but thinks she has had good fluid intake. No fevers. States she had a catheter for 3 weeks. States she had been out cath that was painful. Also states that she has a PICC line for IV antibiotics.   Past Medical History:  Diagnosis Date  . Anxiety   . Arthritis   . Complication of anesthesia 2012   "irritated trachea" with cough x 1 year from intubation  . Esophageal hernia   . Fibromyalgia   . HTN (hypertension)   . Hx of colonic polyps   . Hydronephrosis of right kidney   . Hypercholesterolemia   . Neuropathy due to medical condition ([place])    bilateral lower legs  . Obesity   . 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 Renal angiomyolipoma     Patient Active Problem List   Diagnosis Date Noted  . Essential hypertension 12/08/2016  . 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 Urinary retention 12/02/2016  . Cellulitis of left lower leg 11/12/2016  . Knee pain, left 11/11/2016  . Left leg swelling 11/10/2016  . Pain in left leg 11/10/2016  . Presence of left artificial knee joint 11/10/2016  . Trigeminal neuralgia 08/19/2015  . Musculoskeletal neck pain 08/19/2015  . Peripheral neuropathy 01/12/2014  . Hyperglycemia 06/16/2012  . 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  Dyslipidemia 06/16/2012  . Globus sensation 02/04/2012  . Nausea 02/04/2012  . Bowel habit changes 02/04/2012  . Pyrosis 02/04/2012    Past Surgical History:  Procedure Laterality Date  . ABDOMINAL HYSTERECTOMY  1969  . BACK SURGERY  2012   rod in back-  lumbar disc removal with bone graft  . blephoroplasty     bilateral  . BREAST BIOPSY  1989  . CHOLECYSTECTOMY  2010  . EXCISIONAL TOTAL KNEE ARTHROPLASTY WITH ANTIBIOTIC SPACERS Left 11/14/2016   Procedure: EXCISIONAL LEFT TOTAL KNEE ARTHROPLASTY WITH  PLACEMENT OF ANTIBIOTIC SPACERS;  Surgeon: [name], MD;  Location: [facility];  Service: Orthopedics;  Laterality: Left;  . NISSEN FUNDOPLICATION    . PARAESOPHAGEAL HERNIA REPAIR  1989  . TONSILLECTOMY  1946  . TOTAL HIP ARTHROPLASTY Right 08/31/2012   Procedure: TOTAL HIP ARTHROPLASTY ANTERIOR APPROACH;  Surgeon: Mauri Pole, MD;  Location: [facility];  Service: Orthopedics;  Laterality: Right;  . TOTAL KNEE ARTHROPLASTY  2001   left  . TOTAL KNEE ARTHROPLASTY  2010   right    OB History    No data available       Home Medications    Prior to Admission medications   Medication Sig Start Date End Date Taking? Authorizing Provider  atenolol (TENORMIN) 25 MG tablet TAKE 1 TABLET BY MOUTH DAILY. 06/03/16   Lelon Perla, MD  Calcium Carbonate-Vitamin  D (CALCIUM 600+D) 600-400 MG-UNIT tablet Take 1 tablet by mouth every other day.    [provider]  ceFEPIme 1 g in dextrose 5 % 50 mL Inject 1 g into the vein every 12 (twelve) hours.    [provider]  docusate sodium (COLACE) 100 MG capsule Take 1 capsule (100 mg total) by mouth 2 (two) times daily. 11/19/16   [name], MD  folic acid (FOLVITE) 347 MCG tablet Take 400 mcg by mouth daily.    [provider]  gabapentin (NEURONTIN) 300 MG capsule Take 2 capsules (600 mg total) by mouth 3 (three) times daily. 05/23/16   Melvenia Beam, MD  HYDROcodone-acetaminophen  (NORCO/VICODIN) 5-325 MG tablet Take 1 tablet by mouth every 6 (six) hours as needed for moderate pain. DO NOT EXCEED 3GM OF APAP IN 24 HOURS FROM ALL SOURCES 11/24/16   Lauree Chandler, NP  Multiple Vitamin (MULTIVITAMIN WITH MINERALS) TABS tablet Take 1 tablet by mouth every other day.    [provider]  promethazine (PHENERGAN) 25 MG tablet Take 25 mg by mouth every 8 (eight) hours as needed for nausea or vomiting.     [provider]  rivaroxaban (XARELTO) 10 MG TABS tablet Take 1 tablet (10 mg total) by mouth daily. 11/19/16   [name], MD  rosuvastatin (CRESTOR) 10 MG tablet Take 1 tablet (10 mg total) by mouth daily. 11/03/16   Lelon Perla, MD  tamsulosin (FLOMAX) 0.4 MG CAPS capsule Take 0.4 mg by mouth every evening.    [provider]    Family History Family History  Problem Relation Age of Onset  . Diabetes Father   . Osteoarthritis Father   . Heart disease Father   . Stroke Father   . Osteoarthritis Mother   . Stroke Mother   . Pneumonia Mother   . Colon cancer Sister 75  . Colon polyps Unknown        neice/nephew  . Diabetes Sister   . Diabetes Brother     Social History Social History  Substance Use Topics  . Smoking status: Never Smoker  . Smokeless tobacco: Never Used  . Alcohol use No     Allergies   Epinephrine; Levofloxacin; Lipitor [atorvastatin]; and Trovan [alatrofloxacin]   Review of Systems Review of Systems  Constitutional: Negative for appetite change.  HENT: Negative for congestion.   Respiratory: Negative for cough and shortness of breath.   Cardiovascular: Negative for chest pain.  Gastrointestinal: Negative for abdominal distention.  Genitourinary: Negative for flank pain.  Musculoskeletal: Negative for back pain.  Neurological: Negative for seizures.  Hematological: Negative for adenopathy.  Psychiatric/Behavioral: Negative for confusion.     Physical Exam Updated Vital Signs BP (!)  142/68 (BP Location: Right Arm)   Pulse 70   Temp 98.4 F (36.9 C) (Oral)   Resp 18   Ht 5\' 6"  (1.676 m)   Wt 92.5 kg (204 lb)   SpO2 100%   BMI 32.93 kg/m   Physical Exam  Constitutional: She appears well-developed.  HENT:  Head: Atraumatic.  Neck: Neck supple.  Cardiovascular: Normal rate.   Pulmonary/Chest: Effort normal.  Abdominal: Soft. There is no tenderness.  Musculoskeletal:  Left upper extremity with PICC line. Left lower extremity in knee immobilizer.  Neurological: She is alert.  Skin: Skin is warm. Capillary refill takes less than 2 seconds.     ED Treatments / Results  Labs (all labs ordered are listed, but only abnormal  results are displayed) Labs Reviewed  CBC WITH DIFFERENTIAL/PLATELET - Abnormal; Notable for the following:       Result Value   RBC 3.50 (*)    Hemoglobin 9.9 (*)    HCT 31.0 (*)    All other components within normal limits  URINALYSIS, ROUTINE W REFLEX MICROSCOPIC - Abnormal; Notable for the following:    Hgb urine dipstick SMALL (*)    Protein, ur 30 (*)    Bacteria, UA RARE (*)    Squamous Epithelial / LPF 0-5 (*)    All other components within normal limits    EKG  EKG Interpretation None       Radiology US Renal  Result Date: 12/15/2016 CLINICAL DATA:  Renal failure EXAM: RENAL / URINARY TRACT ULTRASOUND COMPLETE COMPARISON:  None. FINDINGS: Right Kidney: Length: 11.2 cm. 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 No hydronephrosis. Left Kidney: Length: 11.6 cm.  No mass or hydronephrosis. Bladder: Underdistended. 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 No hydronephrosis. Electronically Signed   By: Julian Hy M.D.   On: 12/15/2016 14:10    Procedures Procedures (including critical care time)  Medications Ordered in ED Medications - No data to display   Initial Impression / Assessment and Plan / ED Course  I have reviewed the triage vital signs and the nursing notes.  Pertinent labs &  imaging results that were available during my care of the patient were reviewed by me and considered in my medical decision making (see chart for details).     Patient with worsening kidney function over the last couple weeks. Has had some previous renal insufficiency the past but not this high. No hydronephrosis or urinary retention. She did have IV diet for CTA around 3 weeks ago. Will admit for further evaluation. She is on IV antibiotics for left knee infection.  Final Clinical Impressions(s) / ED Diagnoses   Final diagnoses:  Acute kidney injury [facility])    New Prescriptions New Prescriptions   No medications on file     Davonna Belling, MD 12/15/16 1416

## 2016-12-15 NOTE — Progress Notes (Signed)
PHARMACY NOTE:  ANTIMICROBIAL RENAL DOSAGE ADJUSTMENT  Current antimicrobial regimen includes a mismatch between antimicrobial dosage and estimated renal function.  As per policy approved by the Pharmacy & Therapeutics and Medical Executive Committees, the antimicrobial dosage will be adjusted accordingly.  Current antimicrobial dosage:  Cefepime 1 gram IV q12h  Indication: S/P left knee replacement excision and placement of spacer  Renal Function:  Estimated Creatinine Clearance: 12.1 mL/min (A) (by C-G formula based on SCr of 4.71 mg/dL (H)). []      On intermittent HD, scheduled: []      On CRRT    Antimicrobial dosage has been changed to:  Cefepime 1 gram IV q24h  Additional comments: Will continue to follow and adjust based on renal function Thank you for allowing pharmacy to be a part of this patient's care.  Vernie Ammons, [facility] 12/15/2016 6:16 PM

## 2016-12-15 NOTE — Progress Notes (Signed)
Location:   [place] Room Number: 128/P Place of Service:  SNF (31) Provider:  Charyl Bigger, Denice Bors, MD  Patient Care Team: Lelon Perla, MD as PCP - General (Cardiology)  Extended Emergency Contact Information Primary Emergency Contact: Tish Frederickson, [place] [address] [name] of [place] Phone: [phone] Relation: Son Secondary Emergency Contact: Mosetta Pigeon [place] Mobile Phone: [phone] Relation: Niece  Code Status:  Full Code Goals of care: Advanced Directive information Advanced Directives 12/15/2016  Does Patient Have a Medical Advance Directive? Yes  Type of Advance Directive -  Does patient want to make changes to medical advance directive? No - Patient declined  Would patient like information on creating a medical advance directive? No - Patient declined  Pre-existing out of facility DNR order (yellow form or pink MOST form) -     Chief Complaint  Patient presents with  . Acute Visit    Acute Renal Failure     HPI:  Pt is a 76 y.o. female seen today for an acute visit for  Acute renal Failure.  Patient has h/o Hypertension and left total Knee replacement in 2001 who was admitted in hospital with worsening Left knee pain. She was treated with IV Antibiotics.She underwent Excisional Left Total Knee Arthroplasty with Placement of Antibiotic Spacer on 11/11/16. She is on IV Cefepime for 4 weeks till 06/20. Patient had problems with urinary retention requiring Foley catheter. But then it was removed and started on Flomax. She has done well with voiding but Her creat has been slowly going up and today it is 4.67. Patient feels fine. She is not having any Nausea or vomiting. No Diarrhea. No chest pain cough or abdominal pain. She is drinking enough water.Her weight is down 20 lbs since she has been here. 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 Was suppose to follow with him but never did. Past Medical History:  Diagnosis Date  . Anxiety   . Arthritis   . Complication of anesthesia 2012   "irritated trachea" with cough x 1 year from intubation  . Esophageal hernia   . Fibromyalgia   . HTN (hypertension)   . Hx of colonic polyps   . Hydronephrosis of right kidney   . Hypercholesterolemia   . 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 Obesity   . 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 Renal angiomyolipoma    Past Surgical History:  Procedure Laterality Date  . ABDOMINAL HYSTERECTOMY  1969  . BACK SURGERY  2012   rod in back-  lumbar disc removal with bone graft  . blephoroplasty     bilateral  . BREAST BIOPSY  1989  . CHOLECYSTECTOMY  2010  . EXCISIONAL TOTAL KNEE ARTHROPLASTY WITH ANTIBIOTIC SPACERS Left 11/14/2016   Procedure: EXCISIONAL LEFT TOTAL KNEE ARTHROPLASTY WITH  PLACEMENT OF ANTIBIOTIC SPACERS;  Surgeon: [name], MD;  Location: [facility];  Service: Orthopedics;  Laterality: Left;  . NISSEN FUNDOPLICATION    . PARAESOPHAGEAL HERNIA REPAIR  1989  . TONSILLECTOMY  1946  . TOTAL HIP ARTHROPLASTY Right 08/31/2012   Procedure: TOTAL HIP ARTHROPLASTY ANTERIOR APPROACH;  Surgeon: Mauri Pole, MD;  Location: [facility];  Service: Orthopedics;  Laterality: Right;  . TOTAL KNEE ARTHROPLASTY  2001   left  . TOTAL KNEE ARTHROPLASTY  2010   right    Allergies  Allergen Reactions  . Epinephrine Other (See Comments)    Reaction:  Increased pts BP and caused shoulder to jerk   . Levofloxacin Nausea And Vomiting  . Lipitor [Atorvastatin] Other (See Comments)    Reaction:  Joint pain and dizziness   . Trovan [Alatrofloxacin] Nausea And Vomiting    Pt states that this med caused pancreatitis.    Outpatient Encounter Prescriptions as of 12/15/2016  Medication Sig  . atenolol  (TENORMIN) 25 MG tablet TAKE 1 TABLET BY MOUTH DAILY.  . Calcium Carbonate-Vitamin D (CALCIUM 600+D) 600-400 MG-UNIT tablet Take 1 tablet by mouth every other day.  . ceFEPIme 1 g in dextrose 5 % 50 mL Inject 1 g into the vein every 12 (twelve) hours.  . docusate sodium (COLACE) 100 MG capsule Take 1 capsule (100 mg total) by mouth 2 (two) times daily.  . folic acid (FOLVITE) 921 MCG tablet Take 400 mcg by mouth daily.  Marland Kitchen gabapentin (NEURONTIN) 300 MG capsule Take 2 capsules (600 mg total) by mouth 3 (three) times daily.  Marland Kitchen HYDROcodone-acetaminophen (NORCO/VICODIN) 5-325 MG tablet Take 1 tablet by mouth every 6 (six) hours as needed for moderate pain. DO NOT EXCEED 3GM OF APAP IN 24 HOURS FROM ALL SOURCES  . Multiple Vitamin (MULTIVITAMIN WITH MINERALS) TABS tablet Take 1 tablet by mouth every other day.  . promethazine (PHENERGAN) 25 MG tablet Take 25 mg by mouth every 8 (eight) hours as needed for nausea or vomiting.   . rivaroxaban (XARELTO) 10 MG TABS tablet Take 1 tablet (10 mg total) by mouth daily.  . rosuvastatin (CRESTOR) 10 MG tablet Take 1 tablet (10 mg total) by mouth daily.  . tamsulosin (FLOMAX) 0.4 MG CAPS capsule Take 0.4 mg by mouth every evening.  . [DISCONTINUED] ceFEPIme 2 g in dextrose 5 % 50 mL Inject 2 g into the vein every 12 (twelve) hours.  . [DISCONTINUED] losartan-hydrochlorothiazide (HYZAAR) 50-12.5 MG tablet Take 1 tablet by mouth daily.  . [DISCONTINUED] potassium chloride SA (K-DUR,KLOR-CON) 20 MEQ tablet Take 20 mEq by mouth daily.   No facility-administered encounter medications on file as of 12/15/2016.      Review of Systems  Review of Systems  Constitutional: Negative for activity change, appetite change, chills, diaphoresis, fatigue and fever.  HENT: Negative for mouth sores, postnasal drip, rhinorrhea, sinus pain and sore throat.   Respiratory: Negative for apnea, cough, chest tightness, shortness of breath and wheezing.   Cardiovascular: Negative for  chest pain, palpitations and Positive for leg swelling.  Gastrointestinal: Negative for abdominal distention, abdominal pain, constipation, diarrhea, nausea and vomiting.  Genitourinary: Negative for dysuria and frequency.  Musculoskeletal: Negative for arthralgias, joint swelling and myalgias.  Skin: Negative for rash.  Neurological: Negative for dizziness, syncope, weakness, light-headedness and numbness.  Psychiatric/Behavioral: Negative for behavioral problems, confusion and sleep disturbance.      There is no immunization history on file for this patient. Pertinent  Health Maintenance Due  Topic Date Due  . DEXA SCAN  05/18/2005  . PNA vac Low Risk Adult (1 of 2 - PCV13) 06/06/2017 (Originally 05/18/2005)  . INFLUENZA VACCINE  02/04/2017  . COLONOSCOPY  01/26/2019   No flowsheet data found. Functional Status Survey:    Vitals:   12/15/16 1456  BP: (!) 116/51  Pulse: 77  Resp: 20  Temp: 98 F (36.7  C)  TempSrc: Oral   There is no height or weight on file to calculate BMI. Physical Exam  Constitutional: She is oriented to person, place, and time. She appears well-developed and well-nourished.  HENT:  Head: Normocephalic.  Mouth/Throat: Oropharynx is clear and moist.  Eyes: Pupils are equal, round, and reactive to light.  Neck: Neck supple.  Cardiovascular: Normal rate, regular rhythm and normal heart sounds.   No murmur heard. Pulmonary/Chest: Effort normal and breath sounds normal. No respiratory distress. She has no wheezes. She has no rales.  Abdominal: Soft. Bowel sounds are normal. She exhibits no distension. There is no tenderness. There is no rebound.  Musculoskeletal:  Moderate Edema B/L  Neurological: She is alert and oriented to person, place, and time.  Skin: Skin is warm and dry.  Psychiatric: She has a normal mood and affect. Her behavior is normal. Thought content normal.    Labs reviewed:  Recent Labs  12/09/16 0700 12/15/16 0830  12/15/16 1015  NA 137 136 135  K 4.6 4.9 5.0  CL 105 104 103  CO2 23 21* 21*  GLUCOSE 96 140* 103*  BUN 45* 65* 64*  CREATININE 2.06* 4.76* 4.71*  CALCIUM 9.0 8.6* 8.9    Recent Labs  04/15/16 1145  AST 19  ALT 11  ALKPHOS 82  BILITOT 1.4*  PROT 6.5  ALBUMIN 3.7    Recent Labs  12/08/16 0723 12/09/16 0700 12/15/16 1350  WBC 7.6 7.3 7.8  NEUTROABS 4.2 3.9 4.6  HGB 11.2* 10.0* 9.9*  HCT 35.2* 30.8* 31.0*  MCV 89.8 89.3 88.6  PLT 224 231 214   Lab Results  Component Value Date   TSH 5.347 (H) 11/24/2016   No results found for: HGBA1C Lab Results  Component Value Date   CHOL 136 04/15/2016   HDL 42 (L) 04/15/2016   LDLCALC 57 04/15/2016   TRIG 184 (H) 04/15/2016   CHOLHDL 3.2 04/15/2016    Significant Diagnostic Results in last 30 days:  Ct Angio Chest Pe W Or Wo Contrast  Result Date: 11/18/2016 CLINICAL DATA:  Shortness of breath today, chest discomfort. History of knee surgery on 11/14/2016. EXAM: CT ANGIOGRAPHY CHEST WITH CONTRAST TECHNIQUE: Multidetector CT imaging of the chest was performed using the standard protocol during bolus administration of intravenous contrast. Multiplanar CT image reconstructions and MIPs were obtained to evaluate the vascular anatomy. CONTRAST:  58 cc Isovue 370 COMPARISON:  None. FINDINGS: Cardiovascular: There is no pulmonary embolism identified within the main, lobar or segmental pulmonary arteries bilaterally. Scattered atherosclerotic changes of the normal caliber thoracic aorta. No aneurysm or dissection. Mild cardiomegaly. No pericardial effusion. Mediastinum/Nodes: Scattered small lymph nodes within the mediastinum and perihilar regions, not pathologic by CT size criteria. Esophagus is unremarkable. Trachea and central bronchi are unremarkable. Lungs/Pleura: Scattered ground-glass opacities bilaterally, most likely mild edema. Small bilateral pleural effusions with adjacent atelectasis. No evidence of pneumonia. No  pneumothorax. Upper Abdomen: No acute findings. Musculoskeletal: Mild degenerative change within the thoracic spine. No acute or suspicious osseous finding. Superficial soft tissues are unremarkable. Review of the MIP images confirms the above findings. IMPRESSION: 1. No pulmonary embolism identified. 2. Mild cardiomegaly. Scattered ground-glass opacities bilaterally, most likely mild edema related to mild CHF/volume overload. 3. Small bilateral pleural effusions with adjacent atelectasis. 4. Aortic atherosclerosis. Electronically Signed   By: Franki Cabot M.D.   On: 11/18/2016 17:29   US Renal  Result Date: 12/15/2016 CLINICAL DATA:  Renal failure EXAM: RENAL / URINARY TRACT ULTRASOUND COMPLETE  COMPARISON:  None. FINDINGS: Right Kidney: Length: 11.2 cm. 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 No hydronephrosis. Left Kidney: Length: 11.6 cm.  No mass or hydronephrosis. Bladder: Underdistended. 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 No hydronephrosis. Electronically Signed   By: Julian Hy M.D.   On: 12/15/2016 14:10    Assessment/Plan Acute renal failure D/w Patient She is hesitant to go  To hospital but will send her for further Eval of sudden worsening of her Creat. Her PVR was less then 300 in facility.   Family/ staff Communication:   Labs/tests ordered:

## 2016-12-15 NOTE — H&P (Signed)
History and Physical  Laura Chambers WVP:[id number] DOB: July 15, 1940 DOA: 12/15/2016  PCP: Lelon Perla, MD  Patient coming from: [facility]  Chief Complaint: none  HPI:  76 year old woman no previous kidney problems, presented to the emergency department from skilled nursing facility where she is undergoing rehabilitation for elevated creatinine. Testing in the emergency department confirmed acute kidney injury and the patient was referred for admission.  Patient was discharged 5/16 after undergoing left total knee replacement excision and placement of spacer. She has been on IV antibiotics since (cefepime). She was discharged to [facility] for re-habilitation. She had a catheter placed for urinary retention postoperatively which was in place for approximately 3 weeks and was removed approximately 1 week ago. Repeat laboratories testing in the outpatient setting revealed elevated creatinine. This was again repeated today and she is found to have significant kidney injury and so sent to the emergency department. She actually feels quite well "I got up today and felt the best I felt in a long time". She reports infrequent urination at baseline "that is just me", however she has been urinating with only some burning. She has urinated 3 times today. Besides the pain in her knee she has otherwise felt well. She has had lower extremity edema bilaterally.   Prior to surgery she never had any urinary problems or Foley catheter placement.   ED Course: Afebrile, vital signs stable, no hypoxia.   Review of Systems:  Negative for fever, visual changes, sore throat, rash, new muscle aches, chest pain, SOB, n/v.  Positive for minor bleeding after in/out catheterization of the bladder in the outpatient setting.   Past Medical History:  Diagnosis Date  . Anxiety   . Arthritis   . Complication of anesthesia 2012   "irritated trachea" with cough x 1 year from intubation  . Esophageal hernia     . Fibromyalgia   . HTN (hypertension)   . Hx of colonic polyps   . Hydronephrosis of right kidney   . Hypercholesterolemia   . Neuropathy due to medical condition ([place])    bilateral lower legs  . Obesity   . 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 Renal angiomyolipoma     Past Surgical History:  Procedure Laterality Date  . ABDOMINAL HYSTERECTOMY  1969  . BACK SURGERY  2012   rod in back-  lumbar disc removal with bone graft  . blephoroplasty     bilateral  . BREAST BIOPSY  1989  . CHOLECYSTECTOMY  2010  . EXCISIONAL TOTAL KNEE ARTHROPLASTY WITH ANTIBIOTIC SPACERS Left 11/14/2016   Procedure: EXCISIONAL LEFT TOTAL KNEE ARTHROPLASTY WITH  PLACEMENT OF ANTIBIOTIC SPACERS;  Surgeon: [name], MD;  Location: [facility];  Service: Orthopedics;  Laterality: Left;  . NISSEN FUNDOPLICATION    . PARAESOPHAGEAL HERNIA REPAIR  1989  . TONSILLECTOMY  1946  . TOTAL HIP ARTHROPLASTY Right 08/31/2012   Procedure: TOTAL HIP ARTHROPLASTY ANTERIOR APPROACH;  Surgeon: Mauri Pole, MD;  Location: [facility];  Service: Orthopedics;  Laterality: Right;  . TOTAL KNEE ARTHROPLASTY  2001   left  . TOTAL KNEE ARTHROPLASTY  2010   right     reports that she has never smoked. She has never used smokeless tobacco. She reports that she does not drink alcohol or use drugs.   Allergies  Allergen Reactions  . Epinephrine Other (See Comments)    Reaction:  Increased pts BP and caused shoulder to jerk   .  Levofloxacin Nausea And Vomiting  . Lipitor [Atorvastatin] Other (See Comments)    Reaction:  Joint pain and dizziness   . Trovan [Alatrofloxacin] Nausea And Vomiting    Pt states that this med caused pancreatitis.    Family History  Problem Relation Age of Onset  . Diabetes Father   . Osteoarthritis Father   . Heart disease Father   . Stroke Father   . Osteoarthritis Mother   . Stroke Mother   . Pneumonia Mother   . Colon cancer Sister 62  . Colon polyps  Unknown        neice/nephew  . Diabetes Sister   . Diabetes Brother      Prior to Admission medications   Medication Sig Start Date End Date Taking? Authorizing Provider  atenolol (TENORMIN) 25 MG tablet TAKE 1 TABLET BY MOUTH DAILY. 06/03/16   Lelon Perla, MD  Calcium Carbonate-Vitamin D (CALCIUM 600+D) 600-400 MG-UNIT tablet Take 1 tablet by mouth every other day.    [provider]  ceFEPIme 1 g in dextrose 5 % 50 mL Inject 1 g into the vein every 12 (twelve) hours.    [provider]  docusate sodium (COLACE) 100 MG capsule Take 1 capsule (100 mg total) by mouth 2 (two) times daily. 11/19/16   [name], MD  folic acid (FOLVITE) 937 MCG tablet Take 400 mcg by mouth daily.    [provider]  gabapentin (NEURONTIN) 300 MG capsule Take 2 capsules (600 mg total) by mouth 3 (three) times daily. 05/23/16   Melvenia Beam, MD  HYDROcodone-acetaminophen (NORCO/VICODIN) 5-325 MG tablet Take 1 tablet by mouth every 6 (six) hours as needed for moderate pain. DO NOT EXCEED 3GM OF APAP IN 24 HOURS FROM ALL SOURCES 11/24/16   Lauree Chandler, NP  Multiple Vitamin (MULTIVITAMIN WITH MINERALS) TABS tablet Take 1 tablet by mouth every other day.    [provider]  promethazine (PHENERGAN) 25 MG tablet Take 25 mg by mouth every 8 (eight) hours as needed for nausea or vomiting.     [provider]  rivaroxaban (XARELTO) 10 MG TABS tablet Take 1 tablet (10 mg total) by mouth daily. 11/19/16   [name], MD  rosuvastatin (CRESTOR) 10 MG tablet Take 1 tablet (10 mg total) by mouth daily. 11/03/16   Lelon Perla, MD  tamsulosin (FLOMAX) 0.4 MG CAPS capsule Take 0.4 mg by mouth every evening.    [provider]    Physical Exam: Temperature 98.0, respirations 20, pulse 77, blood pressure 116/51, SPO2 100% on room air.  Appears calm, comfortable sitting in wheelchair.    eyes: Pupils, irises, limits.  Normal.  ENT: Grossly normal hearing, lips and tongue.  Neck: No lymphadenopathy or masses. No thyromegaly.  Cardiovascular: Regular rate and rhythm. No murmur, rub or gallop. 2-3 plus bilateral lower extremity edema.  Respiratory: Clear to auscultation bilaterally. No wheezes, rales or rhonchi. Normal respiratory effort.  Abdomen: Soft, nontender, nondistended. No hepatomegaly noted.  Skin: No rash or induration noted. Nontender to palpation.  Musculoskeletal: Grossly normal digits of the upper extremities. Grossly normal tone and strength in the upper extremities.  Psychiatric: Grossly normal mood and affect. Speech fluent and appropriate.  Wt Readings from Last 3 Encounters:  12/15/16 92.5 kg (204 lb)  11/12/16 85.7 kg (189 lb)  04/01/16 97.1 kg (214 lb)    I have personally reviewed following labs and imaging studies  Labs:   Basic metabolic panel: BUN  64, creatinine 4.71. On 6/5, creatinine was 2.06. Potassium normal.  Hemoglobin stable 9.9. CBC otherwise unremarkable.  Urinalysis equivocal, 30 mg/dL protein  Imaging studies:   Renal ultrasound 1.6 cm right renal angiomyolipoma. No hydronephrosis.  Medical tests:     Test discussed with performing physician:    Decision to obtain old records:     Review and summation of old records:   Discharged 5/16, treated for infected left total knee replacement, underwent excision with placement of spacer.  Principal Problem:   AKI (acute kidney injury) ([place]) Active Problems:   Infected prosthetic knee joint, sequela   Essential hypertension   Assessment/Plan #1 AKI. Etiology unclear. Pattern suggests either prerenal or postrenal etiology. No history to suggest dehydration. Did have urinary retention after surgery, may be an obstructive component. Renal ultrasound unremarkable. Potassium normal. -Discussed with patient. Place Foley catheter. IV fluids. Strict I/O, daily weight. Nephrology consultation in the  morning.   #2 Status post excisional left total knee arthroplasty with placement of antibiotic spacer 11/11/2016. Treated with cefepime, stop date 6/20. -continue Cefepime  #3 Hypertension, stable. -continue atenolol  #4 Chronic normocytic anemia, stable.    Severity of Illness: The appropriate patient status for this patient is INPATIENT. Inpatient status is judged to be reasonable and necessary in order to provide the required intensity of service to ensure the patient's safety. The patient's presenting symptoms, physical exam findings, and initial radiographic and laboratory data in the context of their chronic comorbidities is felt to place them at high risk for further clinical deterioration. Furthermore, it is not anticipated that the patient will be medically stable for discharge from the hospital within 2 midnights of admission. The following factors support the patient status of inpatient.   " The worrisome physical exam findings include bilateral lower extremity edema. " The initial radiographic and laboratory data are worrisome because of acute kidney injury.  * I certify that at the point of admission it is my clinical judgment that the patient will require inpatient hospital care spanning beyond 2 midnights from the point of admission due to high intensity of service, high risk for further deterioration and high frequency of surveillance required.*     DVT prophylaxis:heparin Code Status: full Family Communication: niece at bedside   Time spent: 54 minutes  Murray Hodgkins, MD  [facility] Direct contact: [phone] --[place] [place]  --[email or web address]; password [email or web address]  7PM-7AM contact night coverage as above  12/15/2016, 3:27 PM

## 2016-12-15 NOTE — ED Triage Notes (Signed)
Pt sent from [facility] for evaluation of elevated BUN and Creatinine.  States she had a foley for a while and it was removed.  Had in and out cath and now is having blood in urine.  Currently on Rocephin IV for knee infection.

## 2016-12-16 DIAGNOSIS — D649 Anemia, unspecified: Secondary | ICD-10-CM | POA: Diagnosis present

## 2016-12-16 LAB — CBC
HCT: 28.1 % — ABNORMAL LOW (ref 36.0–46.0)
Hemoglobin: 8.7 g/dL — ABNORMAL LOW (ref 12.0–15.0)
MCH: 28 pg (ref 26.0–34.0)
MCHC: 31 g/dL (ref 30.0–36.0)
MCV: 90.4 fL (ref 78.0–100.0)
PLATELETS: 185 10*3/uL (ref 150–400)
RBC: 3.11 MIL/uL — AB (ref 3.87–5.11)
RDW: 14.6 % (ref 11.5–15.5)
WBC: 7.3 10*3/uL (ref 4.0–10.5)

## 2016-12-16 LAB — CREATININE, URINE, RANDOM: Creatinine, Urine: 69.98 mg/dL

## 2016-12-16 LAB — COMPREHENSIVE METABOLIC PANEL
ALT: 13 U/L — AB (ref 14–54)
ANION GAP: 8 (ref 5–15)
AST: 18 U/L (ref 15–41)
Albumin: 2.5 g/dL — ABNORMAL LOW (ref 3.5–5.0)
Alkaline Phosphatase: 86 U/L (ref 38–126)
BUN: 64 mg/dL — ABNORMAL HIGH (ref 6–20)
CHLORIDE: 107 mmol/L (ref 101–111)
CO2: 21 mmol/L — ABNORMAL LOW (ref 22–32)
CREATININE: 4.64 mg/dL — AB (ref 0.44–1.00)
Calcium: 8.3 mg/dL — ABNORMAL LOW (ref 8.9–10.3)
GFR calc non Af Amer: 8 mL/min — ABNORMAL LOW (ref >60)
GFR, EST AFRICAN AMERICAN: 10 mL/min — AB (ref >60)
Glucose, Bld: 117 mg/dL — ABNORMAL HIGH (ref 65–99)
Potassium: 4.8 mmol/L (ref 3.5–5.1)
Sodium: 136 mmol/L (ref 135–145)
Total Bilirubin: 0.5 mg/dL (ref 0.3–1.2)
Total Protein: 6.6 g/dL (ref 6.5–8.1)

## 2016-12-16 LAB — SODIUM, URINE, RANDOM: SODIUM UR: 74 mmol/L

## 2016-12-16 MED ORDER — LORAZEPAM 0.5 MG PO TABS
0.5000 mg | ORAL_TABLET | Freq: Four times a day (QID) | ORAL | Status: DC | PRN
  Administered 2016-12-16 – 2016-12-23 (×10): 0.5 mg via ORAL
  Filled 2016-12-16 (×10): qty 1

## 2016-12-16 NOTE — Consult Note (Signed)
   [facility] CM Inpatient Consult   12/16/2016  Laura Chambers Jan 31, 1941 [id number]   Patient screened for potential [facility] Management services. Patient is eligible for [place]. Electronic medical record reveals patient's discharge plan is to return to SNF. [facility] Care Management services not appropriate at this time. If patient's post hospital needs change please place a [facility] Care Management consult. For questions please contact:   Allannah Kempen RN, [facility] Liaison  [phone]) Business Mobile [phone]) Toll free office

## 2016-12-16 NOTE — Care Management Note (Signed)
Case Management Note  Patient Details  Name: Laura Chambers MRN: [id number] Date of Birth: February 06, 1941  Subjective/Objective:                  Admitted with AKI. Pt from [facility] where she was at for STR. Anticipate pt will return to complete STR at DC. CSW aware pt coming from facility.   Action/Plan: Anticipate return to SNF. No CM needs anticipated. CSW will make arrangements for return to facility.   Expected Discharge Date:     12/18/2016             Expected Discharge Plan:  [place]  In-House Referral:  Clinical Social Work  Discharge planning Services  CM Consult  Post Acute Care Choice:  NA Choice offered to:  NA  Status of Service:  Completed, signed off  Sherald Barge, RN 12/16/2016, 1:23 PM

## 2016-12-16 NOTE — NC FL2 (Signed)
[place] LEVEL OF CARE SCREENING TOOL     IDENTIFICATION  Patient Name: Laura Chambers Birthdate: [birth year] Sex: female Admission Date (Current Location): 12/15/2016  [facility] and [place] Number:  [employer] and Address:  [place] [address], [place]      Provider Number: [id number]  Attending Physician Name and Address:  Samuella Cota, MD  Relative Name and Phone Number:       Current Level of Care: Hospital Recommended Level of Care: [place] Prior Approval Number:    Date Approved/Denied:   PASRR Number: [id number]  Discharge Plan: SNF    Current Diagnoses: Patient Active Problem List   Diagnosis Date Noted  . AKI (acute kidney injury) ([place]) 12/15/2016  . Essential hypertension 12/08/2016  . 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 Urinary retention 12/02/2016  . Cellulitis of left lower leg 11/12/2016  . Knee pain, left 11/11/2016  . Left leg swelling 11/10/2016  . Pain in left leg 11/10/2016  . Presence of left artificial knee joint 11/10/2016  . Trigeminal neuralgia 08/19/2015  . Musculoskeletal neck pain 08/19/2015  . Peripheral neuropathy 01/12/2014  . Hyperglycemia 06/16/2012  . 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 Dyslipidemia 06/16/2012  . Globus sensation 02/04/2012  . Nausea 02/04/2012  . Bowel habit changes 02/04/2012  . Pyrosis 02/04/2012    Orientation RESPIRATION BLADDER Height & Weight     Self, Time, Situation  Normal Continent Weight: 224 lb 9.6 oz (101.9 kg) Height:  5\' 6"  (167.6 cm)  BEHAVIORAL SYMPTOMS/MOOD NEUROLOGICAL BOWEL NUTRITION STATUS      Continent Diet (See DC summary)  AMBULATORY STATUS COMMUNICATION OF NEEDS Skin   Extensive Assist Verbally Surgical wounds, Skin abrasions, Bruising                       Personal Care Assistance Level of Assistance  Bathing, Feeding, Dressing Bathing  Assistance: Maximum assistance Feeding assistance: Limited assistance Dressing Assistance: Maximum assistance     Functional Limitations Info  Sight, Hearing, Speech Sight Info: Adequate Hearing Info: Adequate Speech Info: Adequate    SPECIAL CARE FACTORS FREQUENCY  PT (By licensed PT), OT (By licensed OT)     PT Frequency: 5x OT Frequency: 5x            Contractures Contractures Info: Not present    Additional Factors Info  Code Status Code Status Info: Full Code             Current Medications (12/16/2016):  This is the current hospital active medication list Current Facility-Administered Medications  Medication Dose Route Frequency Provider Last Rate Last Dose  . 0.9 %  sodium chloride infusion   Intravenous Continuous Samuella Cota, MD 150 mL/hr at 12/16/16 0849    . acetaminophen (TYLENOL) tablet 650 mg  650 mg Oral Q6H PRN Samuella Cota, MD       Or  . acetaminophen (TYLENOL) suppository 650 mg  650 mg Rectal Q6H PRN Samuella Cota, MD      . atenolol (TENORMIN) tablet 25 mg  25 mg Oral Daily Samuella Cota, MD   25 mg at 12/16/16 2683  . ceFEPIme (MAXIPIME) 1 g in dextrose 5 % 50 mL IVPB  1 g Intravenous Q24H Samuella Cota, MD 100 mL/hr at 12/16/16 0934 1 g at 12/16/16 0934  . heparin injection 5,000 Units  5,000 Units Subcutaneous Q8H Sarajane Jews,  Melene Plan, MD   5,000 Units at 12/16/16 [phone]  . HYDROcodone-acetaminophen (NORCO/VICODIN) 5-325 MG per tablet 1 tablet  1 tablet Oral Q6H PRN Samuella Cota, MD   1 tablet at 12/15/16 2129     Discharge Medications: Please see discharge summary for a list of discharge medications.  Relevant Imaging Results:  Relevant Lab Results:   Additional Information SS # [id number]. Pt will need IV antibiotics at d/c.   Lilly Cove, LCSW

## 2016-12-16 NOTE — Clinical Social Work Note (Signed)
Clinical Social Work Assessment  Patient Details  Name: Laura Chambers MRN: [id number] Date of Birth: [birth year]  Date of referral:  12/16/16               Reason for consult:  Discharge Planning                Permission sought to share information with:  Case Manager, Facility Sport and exercise psychologist, Family Supports Permission granted to share information::  Yes, Verbal Permission Granted  Name::        Agency::  [facility]  Relationship::  Son  Contact Information:     Housing/Transportation Living arrangements for the past 2 months:  [place], [place] of Information:  Patient, Medical Team, Case Manager, Facility, Adult Children Patient Interpreter Needed:  None Criminal Activity/Legal Involvement Pertinent to Current Situation/Hospitalization:  No - Comment as needed Significant Relationships:  Adult Children, Other Family Members, Community Support Lives with:  Facility Resident Do you feel safe going back to the place where you live?  Yes Need for family participation in patient care:  Yes (Comment)  Care giving concerns:  No concerns noted. Patient is a current short term resident at [facility]. Placed mid May 2018.   Social Worker assessment / plan:  LCSW continues to follow for discharge planning needs. No current problems and plan to return at discharge. Will most likely need additional physical therapy documentation to assist with insurance authorization. Call placed to facility updated on status of patient.   Employment status:  Retired Forensic scientist:  Managed Care PT Recommendations:  Not assessed at this time Information / Referral to community resources:  [place]  Patient/Family's Response to care:  Understanding  Patient/Family's Understanding of and Emotional Response to Diagnosis, Current Treatment, and Prognosis:  Continues to receive treatment with BUN and leg cellulitis.   Emotional  Assessment Appearance:  Appears stated age Attitude/Demeanor/Rapport:    Affect (typically observed):  Accepting, Adaptable Orientation:  Oriented to Self, Oriented to Place, Oriented to Situation Alcohol / Substance use:  Not Applicable Psych involvement (Current and /or in the community):  No (Comment)  Discharge Needs  Concerns to be addressed:  No discharge needs identified Readmission within the last 30 days:  No Current discharge risk:  None Barriers to Discharge:  Continued Medical Work up, Ship broker (needs renewal for inusrance to return to SNF)   Lilly Cove, LCSW 12/16/2016, 9:51 AM

## 2016-12-16 NOTE — Progress Notes (Signed)
  PROGRESS NOTE  Laura SAMPEY SRP:[id number] DOB: 1940-09-13 DOA: 12/15/2016 PCP: Lelon Perla, MD  Brief Narrative: 76 year old woman no previous kidney problems, presented to the emergency department from skilled nursing facility where she is undergoing rehabilitation for elevated creatinine. Testing in the emergency department confirmed acute kidney injury and the patient was referred for admission.  Assessment/Plan #1: Acute kidney injury. Creatinine modestly improved. Urine output 900. Appreciate nephrology evaluation. -Continue IV fluids, check BMP in the morning. Follow-up urine studies.  #2: Status post excisional left knee total arthroplasty with placement of antibiotic spacer 5/8.  -Continue cefepime with planned stop date 6/20.  #3: Hypertension. -Stable. Continue atenolol.  #4: Chronic normocytic anemia. Hemoglobin slightly lower today. No bleeding. -Check CBC in a.m.   DVT prophylaxis: heparin Code Status: full Family Communication: none Disposition Plan: home    Murray Hodgkins, MD  [facility] Direct contact: [phone] --[place]  --[email or web address]; password [email or web address]  7PM-7AM contact night coverage as above 12/16/2016, 5:26 PM  LOS: 1 day   Consultants:  Nephrology   Procedures:    Antimicrobials:  Cefepime   Interval history/Subjective: Nervous, worried about renal function. Anxious.  Objective: Vitals: afebrile, 97.7, 18, 71, 136/72, 96% on room air  Exam:     Constitutional: Appears anxious but comfortable. Nontoxic.  Eyes: Pupils, irises, lids appear unremarkable.  ENT: Grossly normal hearing, lips and tongue.  Cardiovascular: Regular rate and rhythm. No murmur, rub or gallop. 2+ bilateral LE edema  Respiratory: Clear to auscultation bilaterally. No wheezes, rales or rhonchi. Normal respiratory effort.  Psychiatric: Anxious, perhaps mildly depressed. Judgment and insight appear intact.   I have personally  reviewed the following:   Labs:  Creatinine slightly improved, 4.64. BUN without change, 64. CO2 21. LFTs unremarkable.  Hemoglobin is slightly lower, 8.7.  Imaging studies:    Medical tests:     Test discussed with performing physician:    Decision to obtain old records:    Review and summation of old records:    Scheduled Meds: . atenolol  25 mg Oral Daily  . heparin  5,000 Units Subcutaneous Q8H   Continuous Infusions: . sodium chloride 150 mL/hr at 12/16/16 0849  . ceFEPIme (MAXIPIME) 1 GM IVPB Stopped (12/16/16 1004)    Principal Problem:   AKI (acute kidney injury) ([place]) Active Problems:   Infected prosthetic knee joint, sequela   Essential hypertension   Normocytic anemia   LOS: 1 day

## 2016-12-16 NOTE — Consult Note (Signed)
Reason for Consult: Acute kidney injury Referring Physician: Dr. Loretha Brasil Laura Chambers is an 76 y.o. female.  HPI: She is a patient who has history of hypertension, osteoarthritis status post recent left kidney replacement, history of anemia presently sent from nursing home because of elevated creatinine. Patient postoperatively developed retention of urine requiring Foley catheter placement for about 3 weeks. Her catheter was removed about a week ago. And patient has been also on antibiotics for infected left knee prosthesis. Patient denies any previous history of renal failure, kidney stone. Presently she denies any nausea or vomiting. Patient also denies any difficulty breathing.  Past Medical History:  Diagnosis Date  . Anxiety   . Arthritis   . Complication of anesthesia 2012   "irritated trachea" with cough x 1 year from intubation  . Esophageal hernia   . Fibromyalgia   . HTN (hypertension)   . Hx of colonic polyps   . Hydronephrosis of right kidney   . Hypercholesterolemia   . Neuropathy due to medical condition ([place])    bilateral lower legs  . Obesity   . 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 Renal angiomyolipoma     Past Surgical History:  Procedure Laterality Date  . ABDOMINAL HYSTERECTOMY  1969  . BACK SURGERY  2012   rod in back-  lumbar disc removal with bone graft  . blephoroplasty     bilateral  . BREAST BIOPSY  1989  . CHOLECYSTECTOMY  2010  . EXCISIONAL TOTAL KNEE ARTHROPLASTY WITH ANTIBIOTIC SPACERS Left 11/14/2016   Procedure: EXCISIONAL LEFT TOTAL KNEE ARTHROPLASTY WITH  PLACEMENT OF ANTIBIOTIC SPACERS;  Surgeon: [name], MD;  Location: [facility] ORS;  Service: Orthopedics;  Laterality: Left;  . NISSEN FUNDOPLICATION    . PARAESOPHAGEAL HERNIA REPAIR  1989  . TONSILLECTOMY  1946  . TOTAL HIP ARTHROPLASTY Right 08/31/2012   Procedure: TOTAL HIP ARTHROPLASTY ANTERIOR APPROACH;  Surgeon: Mauri Pole, MD;  Location: [facility]  ORS;  Service: Orthopedics;  Laterality: Right;  . TOTAL KNEE ARTHROPLASTY  2001   left  . TOTAL KNEE ARTHROPLASTY  2010   right    Family History  Problem Relation Age of Onset  . Diabetes Father   . Osteoarthritis Father   . Heart disease Father   . Stroke Father   . Osteoarthritis Mother   . Stroke Mother   . Pneumonia Mother   . Colon cancer Sister 55  . Colon polyps Unknown        neice/nephew  . Diabetes Sister   . Diabetes Brother     Social History:  reports that she has never smoked. She has never used smokeless tobacco. She reports that she does not drink alcohol or use drugs.  Allergies:  Allergies  Allergen Reactions  . Epinephrine Other (See Comments)    Reaction:  Increased pts BP and caused shoulder to jerk   . Levofloxacin Nausea And Vomiting  . Lipitor [Atorvastatin] Other (See Comments)    Reaction:  Joint pain and dizziness   . Trovan [Alatrofloxacin] Nausea And Vomiting    Pt states that this med caused pancreatitis.    Medications: I have reviewed the patient's current medications.  Results for orders placed or performed during the hospital encounter of 12/15/16 (from the past 48 hour(s))  Urinalysis, Routine w reflex microscopic     Status: Abnormal   Collection Time: 12/15/16 12:47 PM  Result Value Ref Range   Color, Urine YELLOW YELLOW  APPearance CLEAR CLEAR   Specific Gravity, Urine 1.011 1.005 - 1.030   pH 5.0 5.0 - 8.0   Glucose, UA NEGATIVE NEGATIVE mg/dL   Hgb urine dipstick SMALL (A) NEGATIVE   Bilirubin Urine NEGATIVE NEGATIVE   Ketones, ur NEGATIVE NEGATIVE mg/dL   Protein, ur 30 (A) NEGATIVE mg/dL   Nitrite NEGATIVE NEGATIVE   Leukocytes, UA NEGATIVE NEGATIVE   RBC / HPF 0-5 0 - 5 RBC/hpf   WBC, UA 6-30 0 - 5 WBC/hpf   Bacteria, UA RARE (A) NONE SEEN   Squamous Epithelial / LPF 0-5 (A) NONE SEEN   Mucous PRESENT   CBC with Differential     Status: Abnormal   Collection Time: 12/15/16  1:50 PM  Result Value Ref Range    WBC 7.8 4.0 - 10.5 K/uL   RBC 3.50 (L) 3.87 - 5.11 MIL/uL   Hemoglobin 9.9 (L) 12.0 - 15.0 g/dL   HCT 31.0 (L) 36.0 - 46.0 %   MCV 88.6 78.0 - 100.0 fL   MCH 28.3 26.0 - 34.0 pg   MCHC 31.9 30.0 - 36.0 g/dL   RDW 14.8 11.5 - 15.5 %   Platelets 214 150 - 400 K/uL   Neutrophils Relative % 59 %   Neutro Abs 4.6 1.7 - 7.7 K/uL   Lymphocytes Relative 24 %   Lymphs Abs 1.9 0.7 - 4.0 K/uL   Monocytes Relative 8 %   Monocytes Absolute 0.6 0.1 - 1.0 K/uL   Eosinophils Relative 8 %   Eosinophils Absolute 0.6 0.0 - 0.7 K/uL   Basophils Relative 1 %   Basophils Absolute 0.1 0.0 - 0.1 K/uL  MRSA PCR Screening     Status: None   Collection Time: 12/15/16  6:54 PM  Result Value Ref Range   MRSA by PCR NEGATIVE NEGATIVE    Comment:        The GeneXpert MRSA Assay (FDA approved for NASAL specimens only), is one component of a comprehensive MRSA colonization surveillance program. It is not intended to diagnose MRSA infection nor to guide or monitor treatment for MRSA infections.   Comprehensive metabolic panel     Status: Abnormal   Collection Time: 12/16/16  6:09 AM  Result Value Ref Range   Sodium 136 135 - 145 mmol/L   Potassium 4.8 3.5 - 5.1 mmol/L   Chloride 107 101 - 111 mmol/L   CO2 21 (L) 22 - 32 mmol/L   Glucose, Bld 117 (H) 65 - 99 mg/dL   BUN 64 (H) 6 - 20 mg/dL   Creatinine, Ser 4.64 (H) 0.44 - 1.00 mg/dL   Calcium 8.3 (L) 8.9 - 10.3 mg/dL   Total Protein 6.6 6.5 - 8.1 g/dL   Albumin 2.5 (L) 3.5 - 5.0 g/dL   AST 18 15 - 41 U/L   ALT 13 (L) 14 - 54 U/L   Alkaline Phosphatase 86 38 - 126 U/L   Total Bilirubin 0.5 0.3 - 1.2 mg/dL   GFR calc non Af Amer 8 (L) >60 mL/min   GFR calc Af Amer 10 (L) >60 mL/min    Comment: (NOTE) The eGFR has been calculated using the CKD EPI equation. This calculation has not been validated in all clinical situations. eGFR's persistently <60 mL/min signify possible Chronic Kidney Disease.    Anion gap 8 5 - 15  CBC     Status: Abnormal    Collection Time: 12/16/16  6:09 AM  Result Value Ref Range   WBC 7.3 4.0 -  10.5 K/uL   RBC 3.11 (L) 3.87 - 5.11 MIL/uL   Hemoglobin 8.7 (L) 12.0 - 15.0 g/dL   HCT 28.1 (L) 36.0 - 46.0 %   MCV 90.4 78.0 - 100.0 fL   MCH 28.0 26.0 - 34.0 pg   MCHC 31.0 30.0 - 36.0 g/dL   RDW 14.6 11.5 - 15.5 %   Platelets 185 150 - 400 K/uL    US Renal  Result Date: 12/15/2016 CLINICAL DATA:  Renal failure EXAM: RENAL / URINARY TRACT ULTRASOUND COMPLETE COMPARISON:  None. FINDINGS: Right Kidney: Length: 11.2 cm. 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 No hydronephrosis. Left Kidney: Length: 11.6 cm.  No mass or hydronephrosis. Bladder: Underdistended. 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 No hydronephrosis. Electronically Signed   By: Julian Hy M.D.   On: 12/15/2016 14:10    Review of Systems  Constitutional: Negative for chills and fever.  Respiratory: Negative for shortness of breath.   Cardiovascular: Positive for leg swelling. Negative for orthopnea.  Gastrointestinal: Negative for abdominal pain, nausea and vomiting.  Genitourinary: Positive for dysuria.  Musculoskeletal: Positive for joint pain.   Blood pressure 136/72, pulse 71, temperature 97.7 F (36.5 C), temperature source Oral, resp. rate 18, height '5\' 6"'  (1.676 m), weight 101.9 kg (224 lb 9.6 oz), SpO2 96 %. Physical Exam  Constitutional: She is oriented to person, place, and time. No distress.  Eyes: No scleral icterus.  Neck: No JVD present.  Cardiovascular: Normal rate and regular rhythm.   No murmur heard. Respiratory: She has no wheezes. She has no rales.  GI: She exhibits no distension. There is no tenderness. There is no rebound.  Musculoskeletal: She exhibits edema.  Neurological: She is alert and oriented to person, place, and time.    Assessment/Plan: Problem #1 acute kidney injury   Her creatinine was 0.97 on 12/01/2013                        Increased to 1.14 on 12/04/2016                         Then further increased to 2.0 to about a week ago.                        Presently her creatinine is 4.6 . Patient had about 900 mL of urine output since she came. The etiology for her acute kidney injury could be prerenal syndrome/ATN/AIN from antibiotics. Her creatinine seems to be showing some improvement. At this moment patient does not seem to have acute obstructive uropathy as her ultrasound didn't show any hydronephrosis. Problem #2 anemia: Her hemoglobin is low. Most likely iron deficiency anemia Problem #3 hypertension: Her blood pressure is reasonably controlled Problem #4 history of infected prosthesis of her left knee Problem #5 history of obstructive uropathy: Presently patient had Foley catheter and she had about 900 mL of urine output. Problem #6 history of leg swelling left greater than right. According the patient she has this for some time. Plan: 1] Agree with hydration 2] We'll check her urine sodium, creatinine 3] We'll check urine for eosinophiluria 4]We'll check her basic metabolic panel in the morning Telia Amundson S 12/16/2016, 9:50 AM

## 2016-12-17 LAB — RENAL FUNCTION PANEL
ALBUMIN: 2.6 g/dL — AB (ref 3.5–5.0)
ANION GAP: 7 (ref 5–15)
BUN: 61 mg/dL — ABNORMAL HIGH (ref 6–20)
CALCIUM: 8.3 mg/dL — AB (ref 8.9–10.3)
CO2: 19 mmol/L — AB (ref 22–32)
Chloride: 112 mmol/L — ABNORMAL HIGH (ref 101–111)
Creatinine, Ser: 4.51 mg/dL — ABNORMAL HIGH (ref 0.44–1.00)
GFR, EST AFRICAN AMERICAN: 10 mL/min — AB (ref >60)
GFR, EST NON AFRICAN AMERICAN: 9 mL/min — AB (ref >60)
Glucose, Bld: 97 mg/dL (ref 65–99)
PHOSPHORUS: 4.7 mg/dL — AB (ref 2.5–4.6)
Potassium: 5.1 mmol/L (ref 3.5–5.1)
Sodium: 138 mmol/L (ref 135–145)

## 2016-12-17 LAB — CBC
HCT: 28.9 % — ABNORMAL LOW (ref 36.0–46.0)
HEMOGLOBIN: 9.3 g/dL — AB (ref 12.0–15.0)
MCH: 28.7 pg (ref 26.0–34.0)
MCHC: 32.2 g/dL (ref 30.0–36.0)
MCV: 89.2 fL (ref 78.0–100.0)
Platelets: 201 10*3/uL (ref 150–400)
RBC: 3.24 MIL/uL — AB (ref 3.87–5.11)
RDW: 15 % (ref 11.5–15.5)
WBC: 9.2 10*3/uL (ref 4.0–10.5)

## 2016-12-17 LAB — GLUCOSE, CAPILLARY
GLUCOSE-CAPILLARY: 103 mg/dL — AB (ref 65–99)
Glucose-Capillary: 101 mg/dL — ABNORMAL HIGH (ref 65–99)
Glucose-Capillary: 90 mg/dL (ref 65–99)

## 2016-12-17 MED ORDER — ONDANSETRON HCL 4 MG/2ML IJ SOLN
4.0000 mg | Freq: Once | INTRAMUSCULAR | Status: AC
  Administered 2016-12-17: 4 mg via INTRAVENOUS
  Filled 2016-12-17: qty 2

## 2016-12-17 MED ORDER — ONDANSETRON HCL 4 MG/2ML IJ SOLN
4.0000 mg | Freq: Four times a day (QID) | INTRAMUSCULAR | Status: DC | PRN
Start: 2016-12-17 — End: 2017-01-01
  Administered 2016-12-18 – 2016-12-24 (×14): 4 mg via INTRAVENOUS
  Filled 2016-12-17 (×16): qty 2

## 2016-12-17 NOTE — Evaluation (Signed)
Physical Therapy Evaluation Patient Details Name: Laura Chambers MRN: [id number] DOB: [birth year] Today's Date: 12/17/2016   History of Present Illness  Laura Chambers is a 76yo white female who comes to [facility] from [facility] due to declining kidney function, admitted with AKI. Pt has been at [facility] on IV ABX since hardware removal from remote Left TKA and placement of ABX spacer. Has been receiving rehab at [facility] as well to address mobility deficits. She remains Lt TTWB, and revision surgery has not been scheduled at this point. Prior to sepsis in May, pt was a Corporate investment banker adult, still working, AMB withotu restriction or AD, living alone. PMH: bilat TKA (2001, 2010), Rt anterior THA [name] 2014), back surgery, anxiety, fibromyalgia, HTN, BLE neuropathy, Lt foot drop.   Clinical Impression  Pt admitted with above diagnosis. Pt currently with functional limitations due to the deficits listed below (see "PT Problem List"). Upon entry, the patient is received semirecumbent in bed, giving very thorough description of course of care over the last 4 weeks.Functional mobility assessment demonstrates performance of bed mobility and transfers with modified independence, RW, good adherence to TTWB, and safe practices. Minimal effort is required for all mobility today, however pt has been unable to achieve meaningful AMB while at [facility], which would require a Rt 2-point hop-to gait, which has been excessively stressful on remote Rt TKA and Rt THA. AMB has also been limited by weakness in BUE required for intermittent weightbearing during the gait cycle, although she has been able to self propel in [facility] throughout the facility. She Pt is still planned to be on IV ABX s/p DC which may dictate DC location more than need for rehab at this time. It patient is to return to home, significant accommodations will be needed to assure patient has all needed DME for safe/independent living.  Pt will benefit from skilled PT intervention to  increase independence and safety with basic mobility in preparation for discharge to the venue listed below.       Follow Up Recommendations SNF    Equipment Recommendations  None recommended by PT    Recommendations for Other Services       Precautions / Restrictions Precautions Precautions: Fall;Knee Required Braces or Orthoses: Knee Immobilizer - Left Knee Immobilizer - Left: On when out of bed or walking (per patient) Restrictions Weight Bearing Restrictions: Yes LLE Weight Bearing: Touchdown weight bearing      Mobility  Bed Mobility Overal bed mobility: Independent Bed Mobility: Supine to Sit     Supine to sit: Independent        Transfers Overall transfer level: Modified independent Equipment used: None;Rolling walker (2 wheeled) Transfers: Sit to/from [facility] Sit to Stand: Supervision Stand pivot transfers: Supervision       General transfer comment: knows well safe techniques and weight bearing restricton with good adherance   Ambulation/Gait             General Gait Details: not ambulating at this time  Stairs            Wheelchair Mobility    Modified Rankin (Stroke Patients Only)       Balance Overall balance assessment: Modified Independent;No apparent balance deficits (not formally assessed)                                           Pertinent Vitals/Pain Pain Assessment:  No/denies pain    Home Living Family/patient expects to be discharged to:: Skilled nursing facility Living Arrangements: Alone Available Help at Discharge: Family Type of Home: House Home Access: Stairs to enter Entrance Stairs-Rails: None Entrance Stairs-Number of Steps: 2 Home Layout: One level Home Equipment: Environmental consultant - 2 wheels;Cane - single point;Shower seat      Prior Function Level of Independence: Independent (2MA)               Hand Dominance        Extremity/Trunk Assessment   Upper  Extremity Assessment Upper Extremity Assessment: Overall WFL for tasks assessed;LUE deficits/detail LUE Deficits / Details: limb restricted by precautions    Lower Extremity Assessment Lower Extremity Assessment: Overall WFL for tasks assessed       Communication   Communication: No difficulties  Cognition Arousal/Alertness: Awake/alert Behavior During Therapy: WFL for tasks assessed/performed Overall Cognitive Status: Within Functional Limits for tasks assessed                                        General Comments      Exercises Other Exercises Other Exercises: STS transfers with RW: 5x from chair, LLE TTWB   Assessment/Plan    PT Assessment Patient needs continued PT services  PT Problem List Decreased strength;Decreased range of motion;Decreased activity tolerance;Decreased mobility;Decreased knowledge of use of DME;Pain;Obesity       PT Treatment Interventions DME instruction;Gait training;Stair training;Functional mobility training;Therapeutic activities;Therapeutic exercise;Patient/family education    PT Goals (Current goals can be found in the Care Plan section)  Acute Rehab PT Goals Patient Stated Goal: return to home when able  PT Goal Formulation: With patient Time For Goal Achievement: 12/31/16 Potential to Achieve Goals: Good    Frequency Min 2X/week   Barriers to discharge Decreased caregiver support needs a ramp to enter home    Co-evaluation               AM-PAC PT "6 Clicks" Daily Activity  Outcome Measure Difficulty turning over in bed (including adjusting bedclothes, sheets and blankets)?: A Little Difficulty moving from lying on back to sitting on the side of the bed? : A Little Difficulty sitting down on and standing up from a chair with arms (e.g., wheelchair, bedside commode, etc,.)?: A Little Help needed moving to and from a bed to chair (including a wheelchair)?: A Little Help needed walking in hospital room?:  Total Help needed climbing 3-5 steps with a railing? : Total 6 Click Score: 14    End of Session Equipment Utilized During Treatment: Gait belt Activity Tolerance: Patient tolerated treatment well Patient left: in chair;with call bell/phone within reach;with chair alarm set Nurse Communication: Mobility status PT Visit Diagnosis: Difficulty in walking, not elsewhere classified (R26.2);Muscle weakness (generalized) (M62.81)    Time: 8182-9937 PT Time Calculation (min) (ACUTE ONLY): 36 min   Charges:   PT Evaluation $PT Eval Moderate Complexity: 1 Procedure PT Treatments $Therapeutic Activity: 8-22 mins   PT G Codes:        12:16 PM, January 15, 2017 [name], PT, DPT Physical Therapist - [place] [phone] (Office)    Valla Pacey C 01-15-17, 12:11 PM

## 2016-12-17 NOTE — Progress Notes (Signed)
[facility]  PROGRESS NOTE  Laura Chambers FTD:[id number] DOB: 27-Feb-1941 DOA: 12/15/2016 PCP: Lelon Perla, MD   Brief HPI:   76 year old female with no history of kidney problems came to emergency room from skilled facility where she is undergoing rehabilitation for excisional left knee arthroplasty with placement of antibiotic spacer on 11/11/16. Came to hospital with worsening creatinine. Patient admitted with acute kidney injury.    Subjective   Patient seen and examined, complains of wheezing this morning. Denies abdominal pain. It also complains of nausea. No vomiting.   Assessment/Plan:     1. Acute kidney injury-patient came with BUN/creatinine of 64/4.71, her previous creatinine on 12/09/2016 was 2.06. Nephrology has been consulted, etiology is likely ATN/AIN from antibiotics. Today creatinine is 4.51. Total urine output 1150 over past 24 hours. Will cut down normal saline to 75 ML per hour, as patient is developing fluid overload. Has bibasilar crackles on auscultation. Will await nephrology recommendation. 2. Status post excisional left knee arthroplasty with placement of antibiotic spacer on 11/11/2016- continue cefepime, planned  stop on 12/24/2016. 3. Hypertension-blood pressure stable, continue atenolol. 4. Chronic normocytic anemia- hemoglobin is stable at 9.3.    DVT prophylaxis: Heparin  Code Status: Full code  Family Communication: No family present at bedside   Disposition Plan: Skilled nursing facility when his renal function improves   Consultants:  Nephrology  Procedures:    Continuous infusions . sodium chloride 150 mL/hr at 12/17/16 0754  . ceFEPIme (MAXIPIME) 1 GM IVPB Stopped (12/17/16 0824)      Antibiotics:   Anti-infectives    Start     Dose/Rate Route Frequency Ordered Stop   12/16/16 0800  ceFEPIme (MAXIPIME) 1 g in dextrose 5 % 50 mL IVPB     1 g 100 mL/hr over 30 Minutes Intravenous Every 24 hours 12/15/16 1808          Objective   Vitals:   12/16/16 1553 12/16/16 2145 12/17/16 0500 12/17/16 0516  BP: (!) 154/72 140/67  114/60  Pulse: 64 64  71  Resp: 20 20  20   Temp: 97.8 F (36.6 C) 98.4 F (36.9 C)  98.6 F (37 C)  TempSrc:  Oral  Oral  SpO2: 99% 98%  97%  Weight:  99.3 kg (218 lb 14.4 oz) 99.3 kg (218 lb 14.4 oz)   Height:        Intake/Output Summary (Last 24 hours) at 12/17/16 1258 Last data filed at 12/17/16 0515  Gross per 24 hour  Intake           4062.5 ml  Output              750 ml  Net           3312.5 ml   Filed Weights   12/16/16 0658 12/16/16 2145 12/17/16 0500  Weight: 101.9 kg (224 lb 9.6 oz) 99.3 kg (218 lb 14.4 oz) 99.3 kg (218 lb 14.4 oz)     Physical Examination:  Physical Exam: Eyes: No icterus, extraocular muscles intact  Mouth: Oral mucosa is moist, no lesions on palate,  Neck: Supple, no deformities, masses, or tenderness Lungs: Normal respiratory effort, bibasilar crackles Heart: Regular rate and rhythm, S1 and S2 normal, no murmurs, rubs auscultated Abdomen: BS normoactive,soft,nondistended,non-tender to palpation,no organomegaly Extremities: Trace edema of the lower extremities Neuro : Alert and oriented to time, place and person, No focal deficits Skin: No rashes seen on exam    Data Reviewed: I have personally reviewed following  labs and imaging studies  CBG:  Recent Labs Lab 12/17/16 0729 12/17/16 1106  GLUCAP 101* 103*    CBC:  Recent Labs Lab 12/15/16 1350 12/16/16 0609 12/17/16 0559  WBC 7.8 7.3 9.2  NEUTROABS 4.6  --   --   HGB 9.9* 8.7* 9.3*  HCT 31.0* 28.1* 28.9*  MCV 88.6 90.4 89.2  PLT 214 185 446    Basic Metabolic Panel:  Recent Labs Lab 12/15/16 0830 12/15/16 1015 12/16/16 0609 12/17/16 0559  NA 136 135 136 138  K 4.9 5.0 4.8 5.1  CL 104 103 107 112*  CO2 21* 21* 21* 19*  GLUCOSE 140* 103* 117* 97  BUN 65* 64* 64* 61*  CREATININE 4.76* 4.71* 4.64* 4.51*  CALCIUM 8.6* 8.9 8.3* 8.3*  PHOS  --    --   --  4.7*    Recent Results (from the past 240 hour(s))  Culture, Urine     Status: None   Collection Time: 12/09/16  7:00 PM  Result Value Ref Range Status   Specimen Description URINE, RANDOM  Final   Special Requests NONE  Final   Culture   Final    NO GROWTH Performed at [facility], [address] [place], [place] [address]    Report Status 12/11/2016 FINAL  Final  MRSA PCR Screening     Status: None   Collection Time: 12/15/16  6:54 PM  Result Value Ref Range Status   MRSA by PCR NEGATIVE NEGATIVE Final    Comment:        The GeneXpert MRSA Assay (FDA approved for NASAL specimens only), is one component of a comprehensive MRSA colonization surveillance program. It is not intended to diagnose MRSA infection nor to guide or monitor treatment for MRSA infections.      Liver Function Tests:  Recent Labs Lab 12/16/16 0609 12/17/16 0559  AST 18  --   ALT 13*  --   ALKPHOS 86  --   BILITOT 0.5  --   PROT 6.6  --   ALBUMIN 2.5* 2.6*   No results for input(s): LIPASE, AMYLASE in the last 168 hours. No results for input(s): AMMONIA in the last 168 hours.     Studies: US Renal  Result Date: 12/15/2016 CLINICAL DATA:  Renal failure EXAM: RENAL / URINARY TRACT ULTRASOUND COMPLETE COMPARISON:  None. FINDINGS: Right Kidney: Length: 11.2 cm. 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 No hydronephrosis. Left Kidney: Length: 11.6 cm.  No mass or hydronephrosis. Bladder: Underdistended. 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 No hydronephrosis. Electronically Signed   By: Julian Hy M.D.   On: 12/15/2016 14:10    Scheduled Meds: . atenolol  25 mg Oral Daily  . heparin  5,000 Units Subcutaneous Q8H      Time spent: 25 min  [facility] Pager [phone]. If 7PM-7AM, please contact night-coverage at [email or web address], Office  [phone]  password [email or web address] 12/17/2016, 12:58 PM  LOS: 2 days

## 2016-12-17 NOTE — Progress Notes (Signed)
Laura Chambers  MRN: [id number]  DOB/AGE: 02-27-1941 76 y.o.  Primary Care Physician:Crenshaw, Denice Bors, MD  Admit date: 12/15/2016  Chief Complaint:  Chief Complaint  Patient presents with  . Abnormal Lab    S-Pt presented on  12/15/2016 with  Chief Complaint  Patient presents with  . Abnormal Lab  .    Pt today feels better  Meds . atenolol  25 mg Oral Daily  . heparin  5,000 Units Subcutaneous Q8H       Physical Exam: Vital signs in last 24 hours: Temp:  [97.8 F (36.6 C)-98.6 F (37 C)] 98.2 F (36.8 C) (06/13 1344) Pulse Rate:  [64-71] 64 (06/13 1344) Resp:  [19-20] 19 (06/13 1344) BP: (114-154)/(58-72) 145/58 (06/13 1344) SpO2:  [97 %-99 %] 99 % (06/13 1344) Weight:  [218 lb 14.4 oz (99.3 kg)] 218 lb 14.4 oz (99.3 kg) (06/13 0500) Weight change: 14 lb 14.4 oz (6.759 kg) Last BM Date: 12/16/16  Intake/Output from previous day: 06/12 0701 - 06/13 0700 In: 4062.5 [P.O.:360; I.V.:3652.5; IV Piggyback:50] Out: 1150 [Urine:1150] Total I/O In: 720 [P.O.:720] Out: -    Physical Exam: General- pt is awake,alert, oriented to time place and person Resp- No acute REsp distress, CTA B/L NO Rhonchi CVS- S1S2 regular ij rate and rhythm GIT- BS+, soft, NT, ND EXT- NO LE Edema, Cyanosis   Lab Results: CBC  Recent Labs  12/16/16 0609 12/17/16 0559  WBC 7.3 9.2  HGB 8.7* 9.3*  HCT 28.1* 28.9*  PLT 185 201    BMET  Recent Labs  12/16/16 0609 12/17/16 0559  NA 136 138  K 4.8 5.1  CL 107 112*  CO2 21* 19*  GLUCOSE 117* 97  BUN 64* 61*  CREATININE 4.64* 4.51*  CALCIUM 8.3* 8.3*    Creat trend 2018  4.6==> 4.5 ( May 1.0--2.5)  MICRO Recent Results (from the past 240 hour(s))  Culture, Urine     Status: None   Collection Time: 12/09/16  7:00 PM  Result Value Ref Range Status   Specimen Description URINE, RANDOM  Final   Special Requests NONE  Final   Culture   Final    NO GROWTH Performed at [facility], [address],  [place], [place] [address]    Report Status 12/11/2016 FINAL  Final  MRSA PCR Screening     Status: None   Collection Time: 12/15/16  6:54 PM  Result Value Ref Range Status   MRSA by PCR NEGATIVE NEGATIVE Final    Comment:        The GeneXpert MRSA Assay (FDA approved for NASAL specimens only), is one component of a comprehensive MRSA colonization surveillance program. It is not intended to diagnose MRSA infection nor to guide or monitor treatment for MRSA infections.       Lab Results  Component Value Date   CALCIUM 8.3 (L) 12/17/2016   PHOS 4.7 (H) 12/17/2016         Impression: 1)Renal  AKI secondary to ATN                NON Oliguric ATN                Pt had earlier episodes of AKI in May sec to obstructive uropathy                AKI better                Creat trending down slowly.  2)HTN  Medication- On Beta blockers    3)Anemia HGb at goal (9--11)   4)CKD Mineral-Bone Disorder  Phosphorus bear to   goal. Calcium at goal when corrected for low albumin  5)Ortho-hx of Infected prosthesis. Primary MD following  6)Electyolytes Normokalemic NOrmonatremic   7)Acid base Co2 not  at goal NON AG acidosis    Plan:    Will continue current care. If Bicarb lower than 18, will start [place] 12/17/2016, 2:29 PM

## 2016-12-17 NOTE — Progress Notes (Signed)
Pt c/o nausea, stating she can not vomit because she had her esophagus wrapped. Dr. Shanon Brow paged and made aware. RN asked for something for nausea. Waiting for orders/

## 2016-12-18 LAB — BASIC METABOLIC PANEL
ANION GAP: 6 (ref 5–15)
BUN: 59 mg/dL — ABNORMAL HIGH (ref 6–20)
CALCIUM: 8.3 mg/dL — AB (ref 8.9–10.3)
CO2: 20 mmol/L — ABNORMAL LOW (ref 22–32)
Chloride: 111 mmol/L (ref 101–111)
Creatinine, Ser: 4.55 mg/dL — ABNORMAL HIGH (ref 0.44–1.00)
GFR, EST AFRICAN AMERICAN: 10 mL/min — AB (ref >60)
GFR, EST NON AFRICAN AMERICAN: 9 mL/min — AB (ref >60)
Glucose, Bld: 104 mg/dL — ABNORMAL HIGH (ref 65–99)
Potassium: 5.5 mmol/L — ABNORMAL HIGH (ref 3.5–5.1)
SODIUM: 137 mmol/L (ref 135–145)

## 2016-12-18 MED ORDER — FUROSEMIDE 10 MG/ML IJ SOLN
100.0000 mg | Freq: Two times a day (BID) | INTRAVENOUS | Status: DC
  Administered 2016-12-18 – 2016-12-20 (×5): 100 mg via INTRAVENOUS
  Filled 2016-12-18 (×9): qty 10

## 2016-12-18 NOTE — Progress Notes (Signed)
Subjective: Interval History: has no complaint of difficulty breathing. Overall she feels good. No nausea or vomiting..  Objective: Vital signs in last 24 hours: Temp:  [98 F (36.7 C)-98.2 F (36.8 C)] 98 F (36.7 C) (06/13 2059) Pulse Rate:  [64-76] 76 (06/14 0630) Resp:  [19-20] 20 (06/14 0630) BP: (132-152)/(58-72) 132/65 (06/14 0630) SpO2:  [94 %-99 %] 94 % (06/14 0630) Weight:  [103.8 kg (228 lb 12.8 oz)] 103.8 kg (228 lb 12.8 oz) (06/14 0500) Weight change: 4.491 kg (9 lb 14.4 oz)  Intake/Output from previous day: 06/13 0701 - 06/14 0700 In: 2675 [P.O.:1200; I.V.:1425; IV Piggyback:50] Out: 1100 [Urine:1100] Intake/Output this shift: No intake/output data recorded.  General appearance: alert, cooperative and no distress Resp: clear to auscultation bilaterally Cardio: regular rate and rhythm Extremities: edema She has trace to 1+ edema right greater than left  Lab Results:  Recent Labs  12/16/16 0609 12/17/16 0559  WBC 7.3 9.2  HGB 8.7* 9.3*  HCT 28.1* 28.9*  PLT 185 201   BMET:  Recent Labs  12/17/16 0559 12/18/16 0550  NA 138 137  K 5.1 5.5*  CL 112* 111  CO2 19* 20*  GLUCOSE 97 104*  BUN 61* 59*  CREATININE 4.51* 4.55*  CALCIUM 8.3* 8.3*   No results for input(s): PTH in the last 72 hours. Iron Studies: No results for input(s): IRON, TIBC, TRANSFERRIN, FERRITIN in the last 72 hours.  Studies/Results: No results found.  I have reviewed the patient's current medications.  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 Presently her renal function is not showing any improvement. Patient however denies any nausea or vomiting. Patient is non-oliguric with 1100 mL of urine output. Problem #2 hypertension: Her blood pressure is reasonably controlled Problem #3 anemia: Her hemoglobin is low but improving. Problem #4  hypertension: Her blood pressure is reasonably controlled Problem #5 hyperkalemia: Most likely from high potassium intake and worsening of renal failure Problem #6 history of infected prosthesis: Patient on antibiotics Problem #7 monofilament metabolic disorder: Her calcium and phosphorus is in range  Plan: Patient advised to decrease potassium intake 2] will increase IV fluids to 100 mL per hour 3] will start patient on Lasix 100 mg IV twice a day to improve her urine output and possibly control her potassium. 4] will check her renal panel in the morning   LOS: 3 days   Nyana Haren S 12/18/2016,9:18 AM

## 2016-12-18 NOTE — Progress Notes (Signed)
Pharmacy Antibiotic Note  Laura Chambers is a 76 y.o. female admitted on 12/15/2016 with AKI. Antibiotic tx to be continued with Cefepime for S/P left knee replacement excision and placement of spacer. Treatment to be completed 6/20.  Plan: Continue Cefepime 1g IV q24h Monitor V/S and labs and progress  Height: 5\' 6"  (167.6 cm) Weight: 228 lb 12.8 oz (103.8 kg) IBW/kg (Calculated) : 59.3  Temp (24hrs), Avg:98.1 F (36.7 C), Min:98 F (36.7 C), Max:98.2 F (36.8 C)   Recent Labs Lab 12/15/16 0830 12/15/16 1015 12/15/16 1350 12/16/16 0609 12/17/16 0559 12/18/16 0550  WBC  --   --  7.8 7.3 9.2  --   CREATININE 4.76* 4.71*  --  4.64* 4.51* 4.55*    Estimated Creatinine Clearance: 12.8 mL/min (A) (by C-G formula based on SCr of 4.55 mg/dL (H)).    Allergies  Allergen Reactions  . Epinephrine Other (See Comments)    Reaction:  Increased pts BP and caused shoulder to jerk   . Levofloxacin Nausea And Vomiting  . Lipitor [Atorvastatin] Other (See Comments)    Reaction:  Joint pain and dizziness   . Trovan [Alatrofloxacin] Nausea And Vomiting    Pt states that this med caused pancreatitis.    Thank you for allowing pharmacy to be a part of this patient's care.  Isac Sarna, BS [place], BCPS Clinical Pharmacist Pager [phone]  Cristy Friedlander 12/18/2016 9:01 AM

## 2016-12-18 NOTE — Progress Notes (Signed)
Pt. C/o wheezing. Wheezing noted in upper airway. Clear in bases. Wheezing clears with cough. Dr. Ree Kida via text.

## 2016-12-18 NOTE — Progress Notes (Signed)
PROGRESS NOTE    ANALLELY Chambers  SVX:[id number] DOB: 1941/02/27 DOA: 12/15/2016 PCP: Laura Perla, MD     Brief Narrative:  76 y/o woman admitted from SNF on 6/11 where she is undergoing rehab following a left knee arthroplasty with placement of abx spacer. Sent for admission due to acute renal failure.   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 -Cr has remained around 4.5 this admission. -Etiology unclear. -Renal on board. -Renal function is not showing improvement. -IVF and lasix dose have been increased as per renal recommendations.  Infected Prosthetic Knee Joint -Continue cefepime with planned stop date of 6/20. -Has follow up with ortho scheduled for 6/22.  HTN -Fair control. -Continue atenolol.   DVT prophylaxis: SQ heparin Code Status: full code Family Communication: patient only Disposition Plan: back to SNF once medically ready  Consultants:   Nephrology  Procedures:   None  Antimicrobials:  Anti-infectives    Start     Dose/Rate Route Frequency Ordered Stop   12/16/16 0800  ceFEPIme (MAXIPIME) 1 g in dextrose 5 % 50 mL IVPB     1 g 100 mL/hr over 30 Minutes Intravenous Every 24 hours 12/15/16 1808         Subjective: Felt like she was wheezing earlier today. Has postnasal drip when lying flat.  Objective: Vitals:   12/18/16 0500 12/18/16 0630 12/18/16 0915 12/18/16 1406  BP:  132/65 (!) 142/67 (!) 158/73  Pulse:  76 78 71  Resp:  20 17 19   Temp:   98.3 F (36.8 C) 98.4 F (36.9 C)  TempSrc:   Oral Oral  SpO2:  94% 94% 98%  Weight: 103.8 kg (228 lb 12.8 oz)     Height:        Intake/Output Summary (Last 24 hours) at 12/18/16 1828 Last data filed at 12/18/16 1656  Gross per 24 hour  Intake             1080 ml    Output             2475 ml  Net            -1395 ml   Filed Weights   12/16/16 2145 12/17/16 0500 12/18/16 0500  Weight: 99.3 kg (218 lb 14.4 oz) 99.3 kg (218 lb 14.4 oz) 103.8 kg (228 lb 12.8 oz)    Examination:  General exam: Alert, awake, oriented x 3 Respiratory system: Clear to auscultation. Respiratory effort normal. Cardiovascular system:RRR. No murmurs, rubs, gallops. Gastrointestinal system: Abdomen is nondistended, soft and nontender. No organomegaly or masses felt. Normal bowel sounds heard. Central nervous system: Alert and oriented. No focal neurological deficits. Extremities: No C/C/E, +pedal pulses, left knee edema with recent TKA scar. Skin: No rashes, lesions or ulcers Psychiatry: Judgement and insight appear normal. Mood & affect appropriate.     Data Reviewed: I have personally reviewed following labs and imaging studies  CBC:  Recent Labs Lab 12/15/16 1350 12/16/16 0609 12/17/16 0559  WBC 7.8 7.3 9.2  NEUTROABS 4.6  --   --   HGB 9.9* 8.7* 9.3*  HCT 31.0* 28.1* 28.9*  MCV 88.6 90.4 89.2  PLT 214 185 233   Basic Metabolic Panel:  Recent Labs Lab 12/15/16 0830 12/15/16 1015 12/16/16 0609 12/17/16 0559  12/18/16 0550  NA 136 135 136 138 137  K 4.9 5.0 4.8 5.1 5.5*  CL 104 103 107 112* 111  CO2 21* 21* 21* 19* 20*  GLUCOSE 140* 103* 117* 97 104*  BUN 65* 64* 64* 61* 59*  CREATININE 4.76* 4.71* 4.64* 4.51* 4.55*  CALCIUM 8.6* 8.9 8.3* 8.3* 8.3*  PHOS  --   --   --  4.7*  --    GFR: Estimated Creatinine Clearance: 12.8 mL/min (A) (by C-G formula based on SCr of 4.55 mg/dL (H)). Liver Function Tests:  Recent Labs Lab 12/16/16 0609 12/17/16 0559  AST 18  --   ALT 13*  --   ALKPHOS 86  --   BILITOT 0.5  --   PROT 6.6  --   ALBUMIN 2.5* 2.6*   No results for input(s): LIPASE, AMYLASE in the last 168 hours. No results for input(s): AMMONIA in the last 168 hours. Coagulation Profile: No results for input(s): INR, PROTIME in the last  168 hours. Cardiac Enzymes: No results for input(s): CKTOTAL, CKMB, CKMBINDEX, TROPONINI in the last 168 hours. BNP (last 3 results) No results for input(s): PROBNP in the last 8760 hours. HbA1C: No results for input(s): HGBA1C in the last 72 hours. CBG:  Recent Labs Lab 12/17/16 0729 12/17/16 1106 12/17/16 1641  GLUCAP 101* 103* 90   Lipid Profile: No results for input(s): CHOL, HDL, LDLCALC, TRIG, CHOLHDL, LDLDIRECT in the last 72 hours. Thyroid Function Tests: No results for input(s): TSH, T4TOTAL, FREET4, T3FREE, THYROIDAB in the last 72 hours. Anemia Panel: No results for input(s): VITAMINB12, FOLATE, FERRITIN, TIBC, IRON, RETICCTPCT in the last 72 hours. Urine analysis:    Component Value Date/Time   COLORURINE YELLOW 12/15/2016 1247   APPEARANCEUR CLEAR 12/15/2016 1247   LABSPEC 1.011 12/15/2016 1247   PHURINE 5.0 12/15/2016 1247   GLUCOSEU NEGATIVE 12/15/2016 1247   HGBUR SMALL (A) 12/15/2016 1247   BILIRUBINUR NEGATIVE 12/15/2016 1247   KETONESUR NEGATIVE 12/15/2016 1247   PROTEINUR 30 (A) 12/15/2016 1247   UROBILINOGEN 0.2 08/25/2012 1408   NITRITE NEGATIVE 12/15/2016 1247   LEUKOCYTESUR NEGATIVE 12/15/2016 1247   Sepsis Labs: @LABRCNTIP (procalcitonin:4,lacticidven:4)  ) Recent Results (from the past 240 hour(s))  Culture, Urine     Status: None   Collection Time: 12/09/16  7:00 PM  Result Value Ref Range Status   Specimen Description URINE, RANDOM  Final   Special Requests NONE  Final   Culture   Final    NO GROWTH Performed at [facility], [place] [address], [place],  [address]    Report Status 12/11/2016 FINAL  Final  MRSA PCR Screening     Status: None   Collection Time: 12/15/16  6:54 PM  Result Value Ref Range Status   MRSA by PCR NEGATIVE NEGATIVE Final    Comment:        The GeneXpert MRSA Assay (FDA approved for NASAL specimens only), is one component of a comprehensive MRSA colonization surveillance program. It is  not intended to diagnose MRSA infection nor to guide or monitor treatment for MRSA infections.          Radiology Studies: No results found.      Scheduled Meds: . atenolol  25 mg Oral Daily  . heparin  5,000 Units Subcutaneous Q8H   Continuous Infusions: . sodium chloride 100 mL/hr at 12/18/16 0948  . ceFEPIme (MAXIPIME) 1 GM IVPB Stopped (12/18/16 1152)  . furosemide 100 mg (12/18/16 1800)  LOS: 3 days    Time spent: 25 minutes. Greater than 50% of this time was spent in direct contact with the patient coordinating care.     Laura Frohlich, MD [facility] Pager [phone]  If 7PM-7AM, please contact night-coverage [email or web address] Password [facility] 12/18/2016, 6:28 PM

## 2016-12-19 ENCOUNTER — Telehealth (INDEPENDENT_AMBULATORY_CARE_PROVIDER_SITE_OTHER): Payer: Self-pay | Admitting: Orthopaedic Surgery

## 2016-12-19 LAB — RENAL FUNCTION PANEL
ANION GAP: 9 (ref 5–15)
Albumin: 2.6 g/dL — ABNORMAL LOW (ref 3.5–5.0)
BUN: 61 mg/dL — ABNORMAL HIGH (ref 6–20)
CHLORIDE: 110 mmol/L (ref 101–111)
CO2: 19 mmol/L — AB (ref 22–32)
Calcium: 8.3 mg/dL — ABNORMAL LOW (ref 8.9–10.3)
Creatinine, Ser: 4.61 mg/dL — ABNORMAL HIGH (ref 0.44–1.00)
GFR calc Af Amer: 10 mL/min — ABNORMAL LOW (ref >60)
GFR calc non Af Amer: 8 mL/min — ABNORMAL LOW (ref >60)
GLUCOSE: 107 mg/dL — AB (ref 65–99)
POTASSIUM: 5 mmol/L (ref 3.5–5.1)
Phosphorus: 5.5 mg/dL — ABNORMAL HIGH (ref 2.5–4.6)
Sodium: 138 mmol/L (ref 135–145)

## 2016-12-19 MED ORDER — SODIUM BICARBONATE 650 MG PO TABS
650.0000 mg | ORAL_TABLET | Freq: Two times a day (BID) | ORAL | Status: DC
  Administered 2016-12-19 – 2016-12-26 (×16): 650 mg via ORAL
  Filled 2016-12-19 (×15): qty 1

## 2016-12-19 NOTE — Telephone Encounter (Signed)
Patient called wanting to let Dr Ninfa Linden know she has been in the hospital since Monday for her kidneys. Patient said the medication she was taking for her legs was changed.  The number to contact patient is [phone]

## 2016-12-19 NOTE — Progress Notes (Addendum)
Subjective: Interval History: Patient complains of poor appetite. She had episode of difficulty breathing last night but feeling much better now. Patient denies any nausea or vomiting.  Objective: Vital signs in last 24 hours: Temp:  [98.3 F (36.8 C)-98.6 F (37 C)] 98.3 F (36.8 C) (06/15 0537) Pulse Rate:  [71-78] 75 (06/15 0537) Resp:  [17-19] 18 (06/15 0537) BP: (139-158)/(60-77) 145/60 (06/15 0537) SpO2:  [86 %-98 %] 94 % (06/15 0537) Weight:  [102.9 kg (226 lb 13.7 oz)] 102.9 kg (226 lb 13.7 oz) (06/15 0537) Weight change: -0.883 kg (-1 lb 15.2 oz)  Intake/Output from previous day: 06/14 0701 - 06/15 0700 In: 2400 [P.O.:1080; I.V.:1200; IV Piggyback:120] Out: 2202 [Urine:4125] Intake/Output this shift: Total I/O In: -  Out: 250 [Urine:250]  General appearance: alert, cooperative and no distress Resp: clear to auscultation bilaterally Cardio: regular rate and rhythm Extremities: edema She has trace to 1+ edema right greater than left  Lab Results:  Recent Labs  12/17/16 0559  WBC 9.2  HGB 9.3*  HCT 28.9*  PLT 201   BMET:   Recent Labs  12/18/16 0550 12/19/16 0621  NA 137 138  K 5.5* 5.0  CL 111 110  CO2 20* 19*  GLUCOSE 104* 107*  BUN 59* 61*  CREATININE 4.55* 4.61*  CALCIUM 8.3* 8.3*   No results for input(s): PTH in the last 72 hours. Iron Studies: No results for input(s): IRON, TIBC, TRANSFERRIN, FERRITIN in the last 72 hours.  Studies/Results: No results found.  I have reviewed the patient's current medications.  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 He was off her urine output has been Her renal function didn't show any improvement. Patient had 4100 mL of urine output the last 24 hours. Problem #2 hypertension: Her blood pressure is reasonably controlled Problem #3 anemia: Her hemoglobin is low but  stable. Problem #4 hypertension: Her blood pressure is reasonably controlled Problem #5 hyperkalemia: Most likely from high potassium intake and worsening of renal failure. Her potassium has improved Problem #6 history of infected prosthesis: Patient on antibiotics Problem #7 bone and mineral  disorder: Her calcium and phosphorus is in range but her phosphorus is increasing. Problem#8 Low CO2: possibly metabolic acidosis Plan: 1] will continue his present management 2] will start patient on sodium bicarbonate 650 mg by mouth twice a day 3] will check her renal panel in the morning   LOS: 4 days   Ianmichael Amescua S 12/19/2016,8:40 AM

## 2016-12-19 NOTE — Progress Notes (Signed)
LCSW following as patient admitted from SNF: [facility]. Call placed to admissions and updated on patient progress and possible admission over weekend if medical issues resolve.  Call placed to [facility] with [employer] regarding re-auth for patient to return to [facility]. Izora Gala is aware of patient, on call for weekend and will re-authorize patient to return to [facility]. She will need to be called over weekend if ready for discharge and give auth.  If patient ready, CSW to call [phone]. If not over weekend, will follow up on Monday and assist with disposition and needs.  Will follow up.  Lane Hacker, MSW Clinical Social Work: [employer] Cover[employer] for :  [phone]

## 2016-12-19 NOTE — Care Management Important Message (Signed)
Important Message  Patient Details  Name: Laura Chambers MRN: [id number] Date of Birth: [birth year]   Medicare Important Message Given:  Yes    Sherald Barge, RN 12/19/2016, 10:13 AM

## 2016-12-19 NOTE — Progress Notes (Signed)
PROGRESS NOTE    Laura Chambers  SEG:[id number] DOB: 19-Apr-1941 DOA: 12/15/2016 PCP: Lelon Perla, MD     Brief Narrative:  76 y/o woman admitted from SNF on 6/11 where she is undergoing rehab following a left knee arthroplasty with placement of abx spacer. Sent for admission due to acute renal failure.   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 -Cr has increased today to 4.6, UOP was excellent (4100 ml). -Etiology unclear. -Renal on board. -Renal function is not showing improvement. -IVF and lasix dose have been continued as per renal recommendations. -started on bicarb tablets given CO2 of 19 today.  Infected Prosthetic Knee Joint -Continue cefepime with planned stop date of 6/20. -Has follow up with ortho scheduled for 6/22.  HTN -Fair control. -Continue atenolol.   DVT prophylaxis: SQ heparin Code Status: full code Family Communication: patient only Disposition Plan: back to SNF once medically ready  Consultants:   Nephrology  Procedures:   None  Antimicrobials:  Anti-infectives    Start     Dose/Rate Route Frequency Ordered Stop   12/16/16 0800  ceFEPIme (MAXIPIME) 1 g in dextrose 5 % 50 mL IVPB     1 g 100 mL/hr over 30 Minutes Intravenous Every 24 hours 12/15/16 1808         Subjective: Lying in bed, no active complaints today  Objective: Vitals:   12/18/16 2031 12/18/16 2100 12/19/16 0537 12/19/16 1525  BP:  139/77 (!) 145/60 (!) 155/56  Pulse:  77 75 72  Resp:  18 18 18   Temp:  98.6 F (37 C) 98.3 F (36.8 C) 98.5 F (36.9 C)  TempSrc:  Oral Oral Oral  SpO2: (!) 86% 96% 94% 93%  Weight:   102.9 kg (226 lb 13.7 oz)   Height:        Intake/Output Summary (Last 24 hours) at 12/19/16 1710 Last data filed at 12/19/16  1313  Gross per 24 hour  Intake             1320 ml  Output             3250 ml  Net            -1930 ml   Filed Weights   12/17/16 0500 12/18/16 0500 12/19/16 0537  Weight: 99.3 kg (218 lb 14.4 oz) 103.8 kg (228 lb 12.8 oz) 102.9 kg (226 lb 13.7 oz)    Examination:  General exam: Alert, awake, oriented x 3 Respiratory system: Clear to auscultation. Respiratory effort normal. Cardiovascular system:RRR. No murmurs, rubs, gallops. Gastrointestinal system: Abdomen is nondistended, soft and nontender. No organomegaly or masses felt. Normal bowel sounds heard. Central nervous system: Alert and oriented. No focal neurological deficits. Extremities: left knee edema and TKA scarring Skin: No rashes, lesions or ulcers Psychiatry: Judgement and insight appear normal. Mood & affect appropriate.      Data Reviewed: I have personally reviewed following labs and imaging studies  CBC:  Recent Labs Lab 12/15/16 1350 12/16/16 0609 12/17/16 0559  WBC 7.8 7.3 9.2  NEUTROABS 4.6  --   --   HGB 9.9* 8.7* 9.3*  HCT 31.0* 28.1* 28.9*  MCV 88.6 90.4 89.2  PLT 214 185 737   Basic Metabolic Panel:  Recent Labs Lab 12/15/16 1015  12/16/16 0609 12/17/16 0559 12/18/16 0550 12/19/16 0621  NA 135 136 138 137 138  K 5.0 4.8 5.1 5.5* 5.0  CL 103 107 112* 111 110  CO2 21* 21* 19* 20* 19*  GLUCOSE 103* 117* 97 104* 107*  BUN 64* 64* 61* 59* 61*  CREATININE 4.71* 4.64* 4.51* 4.55* 4.61*  CALCIUM 8.9 8.3* 8.3* 8.3* 8.3*  PHOS  --   --  4.7*  --  5.5*   GFR: Estimated Creatinine Clearance: 12.6 mL/min (A) (by C-G formula based on SCr of 4.61 mg/dL (H)). Liver Function Tests:  Recent Labs Lab 12/16/16 0609 12/17/16 0559 12/19/16 0621  AST 18  --   --   ALT 13*  --   --   ALKPHOS 86  --   --   BILITOT 0.5  --   --   PROT 6.6  --   --   ALBUMIN 2.5* 2.6* 2.6*   No results for input(s): LIPASE, AMYLASE in the last 168 hours. No results for input(s): AMMONIA in the last 168  hours. Coagulation Profile: No results for input(s): INR, PROTIME in the last 168 hours. Cardiac Enzymes: No results for input(s): CKTOTAL, CKMB, CKMBINDEX, TROPONINI in the last 168 hours. BNP (last 3 results) No results for input(s): PROBNP in the last 8760 hours. HbA1C: No results for input(s): HGBA1C in the last 72 hours. CBG:  Recent Labs Lab 12/17/16 0729 12/17/16 1106 12/17/16 1641  GLUCAP 101* 103* 90   Lipid Profile: No results for input(s): CHOL, HDL, LDLCALC, TRIG, CHOLHDL, LDLDIRECT in the last 72 hours. Thyroid Function Tests: No results for input(s): TSH, T4TOTAL, FREET4, T3FREE, THYROIDAB in the last 72 hours. Anemia Panel: No results for input(s): VITAMINB12, FOLATE, FERRITIN, TIBC, IRON, RETICCTPCT in the last 72 hours. Urine analysis:    Component Value Date/Time   COLORURINE YELLOW 12/15/2016 1247   APPEARANCEUR CLEAR 12/15/2016 1247   LABSPEC 1.011 12/15/2016 1247   PHURINE 5.0 12/15/2016 1247   GLUCOSEU NEGATIVE 12/15/2016 1247   HGBUR SMALL (A) 12/15/2016 1247   BILIRUBINUR NEGATIVE 12/15/2016 1247   KETONESUR NEGATIVE 12/15/2016 1247   PROTEINUR 30 (A) 12/15/2016 1247   UROBILINOGEN 0.2 08/25/2012 1408   NITRITE NEGATIVE 12/15/2016 1247   LEUKOCYTESUR NEGATIVE 12/15/2016 1247   Sepsis Labs: @LABRCNTIP (procalcitonin:4,lacticidven:4)  ) Recent Results (from the past 240 hour(s))  Culture, Urine     Status: None   Collection Time: 12/09/16  7:00 PM  Result Value Ref Range Status   Specimen Description URINE, RANDOM  Final   Special Requests NONE  Final   Culture   Final    NO GROWTH Performed at [facility], [place] [address], [place], [place] [address]    Report Status 12/11/2016 FINAL  Final  MRSA PCR Screening     Status: None   Collection Time: 12/15/16  6:54 PM  Result Value Ref Range Status   MRSA by PCR NEGATIVE NEGATIVE Final    Comment:        The GeneXpert MRSA Assay (FDA approved for NASAL specimens only), is one  component of a comprehensive MRSA colonization surveillance program. It is not intended to diagnose MRSA infection nor to guide or monitor treatment for MRSA infections.          Radiology Studies: No results found.      Scheduled Meds: . atenolol  25 mg Oral Daily  . heparin  5,000 Units Subcutaneous Q8H  . sodium bicarbonate  650 mg Oral BID  Continuous Infusions: . sodium chloride 100 mL/hr at 12/19/16 0551  . ceFEPIme (MAXIPIME) 1 GM IVPB Stopped (12/19/16 1017)  . furosemide Stopped (12/19/16 1048)     LOS: 4 days    Time spent: 25 minutes. Greater than 50% of this time was spent in direct contact with the patient coordinating care.     Lelon Frohlich, MD [facility] Pager [phone]  If 7PM-7AM, please contact night-coverage [email or web address] Password [email or web address] 12/19/2016, 5:10 PM

## 2016-12-19 NOTE — Telephone Encounter (Signed)
FYI

## 2016-12-20 LAB — RENAL FUNCTION PANEL
ALBUMIN: 2.5 g/dL — AB (ref 3.5–5.0)
Anion gap: 10 (ref 5–15)
BUN: 66 mg/dL — AB (ref 6–20)
CHLORIDE: 109 mmol/L (ref 101–111)
CO2: 20 mmol/L — ABNORMAL LOW (ref 22–32)
CREATININE: 5.21 mg/dL — AB (ref 0.44–1.00)
Calcium: 8.3 mg/dL — ABNORMAL LOW (ref 8.9–10.3)
GFR calc Af Amer: 8 mL/min — ABNORMAL LOW (ref >60)
GFR, EST NON AFRICAN AMERICAN: 7 mL/min — AB (ref >60)
GLUCOSE: 96 mg/dL (ref 65–99)
PHOSPHORUS: 5.8 mg/dL — AB (ref 2.5–4.6)
Potassium: 4.7 mmol/L (ref 3.5–5.1)
Sodium: 139 mmol/L (ref 135–145)

## 2016-12-20 LAB — CBC
HEMATOCRIT: 25.5 % — AB (ref 36.0–46.0)
Hemoglobin: 8.3 g/dL — ABNORMAL LOW (ref 12.0–15.0)
MCH: 28.8 pg (ref 26.0–34.0)
MCHC: 32.5 g/dL (ref 30.0–36.0)
MCV: 88.5 fL (ref 78.0–100.0)
PLATELETS: 184 10*3/uL (ref 150–400)
RBC: 2.88 MIL/uL — ABNORMAL LOW (ref 3.87–5.11)
RDW: 15.5 % (ref 11.5–15.5)
WBC: 10.1 10*3/uL (ref 4.0–10.5)

## 2016-12-20 MED ORDER — FUROSEMIDE 10 MG/ML IJ SOLN
60.0000 mg | Freq: Two times a day (BID) | INTRAMUSCULAR | Status: DC
  Administered 2016-12-20 – 2016-12-21 (×3): 60 mg via INTRAVENOUS
  Filled 2016-12-20 (×3): qty 6

## 2016-12-20 NOTE — Progress Notes (Addendum)
Subjective: Interval History: Patient claims feeling much better. She doesn't have any nausea. Her appetite is improving. She denies also any difficulty breathing.  Objective: Vital signs in last 24 hours: Temp:  [98 F (36.7 C)-98.5 F (36.9 C)] 98.2 F (36.8 C) (06/16 0601) Pulse Rate:  [68-72] 68 (06/16 0601) Resp:  [18] 18 (06/16 0601) BP: (148-155)/(56-81) 148/68 (06/16 0601) SpO2:  [92 %-97 %] 97 % (06/16 0601) Weight:  [102.9 kg (226 lb 13.7 oz)] 102.9 kg (226 lb 13.7 oz) (06/16 0601) Weight change: 0 kg (0 lb)  Intake/Output from previous day: 06/15 0701 - 06/16 0700 In: -  Out: 3950 [Urine:3950] Intake/Output this shift: No intake/output data recorded.  General appearance: alert, cooperative and no distress Resp: clear to auscultation bilaterally Cardio: regular rate and rhythm Extremities: edema She has trace to 1+ edema right greater than left  Lab Results:  Recent Labs  12/20/16 0522  WBC 10.1  HGB 8.3*  HCT 25.5*  PLT 184   BMET:   Recent Labs  12/19/16 0621 12/20/16 0522  NA 138 139  K 5.0 4.7  CL 110 109  CO2 19* 20*  GLUCOSE 107* 96  BUN 61* 66*  CREATININE 4.61* 5.21*  CALCIUM 8.3* 8.3*   No results for input(s): PTH in the last 72 hours. Iron Studies: No results for input(s): IRON, TIBC, TRANSFERRIN, FERRITIN in the last 72 hours.  Studies/Results: No results found.  I have reviewed the patient's current medications.  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 Patient remained nonoliguric. She has 3900 mL of urine output. Her creatinine however seems to be increasing. Patient presently doesn't have any uremic sinus symptoms. Problem #2 hypertension: Her blood pressure is reasonably controlled Problem #3 anemia: Her hemoglobin is low And declining. Problem #4 hypertension: Her blood pressure is reasonably  controlled Problem #5 hyperkalemia: Most likely from high potassium intake and worsening of renal failure. Her potassium has corrected. Problem #6 history of infected prosthesis: Patient on antibiotics Problem #7 bone and mineral  disorder: Her calcium and phosphorus is in range but  her phosphorus is increasing. . Patient is not on binder Problem #8 low CO2 possibly metabolic. Patient is started on sodium bicarbonate. Her CO2 is 20 and has improved. Plan: 1] will increase IV fluid to 1 35 mL per hour 2] decrease Lasix to 60 mg IV twice a day 3] continue with sodium bicarbonate 4] will check her CBC, iron studies,vitamin D level, intact PTH and renal panel in the morning   LOS: 5 days   Bentli Llorente S 12/20/2016,8:34 AM

## 2016-12-20 NOTE — Progress Notes (Signed)
PROGRESS NOTE    STEPAHNIE Chambers  FYB:[id number] DOB: 19-Apr-1941 DOA: 12/15/2016 PCP: Lelon Perla, MD     Brief Narrative:  76 y/o woman admitted from SNF on 6/11 where she is undergoing rehab following a left knee arthroplasty with placement of abx spacer. Sent for admission due to acute renal failure.   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 -Cr has increased today to 5.2, UOP was excellent (3900 ml). -Etiology unclear. -Renal on board. -Renal function is not showing improvement. -IVF rate has been increased and lasix has been decreased to 60 BID. -started on bicarb tablets given CO2 of 19 today. -no indication for acute dialysis  Infected Prosthetic Knee Joint -Continue cefepime with planned stop date of 6/20. -Has follow up with ortho scheduled for 6/22.  HTN -Fair control. -Continue atenolol.   DVT prophylaxis: SQ heparin Code Status: full code Family Communication: sister and niece at bedside updated on plan of care and all questions answered Disposition Plan: back to SNF once medically ready  Consultants:   Nephrology  Procedures:   None  Antimicrobials:  Anti-infectives    Start     Dose/Rate Route Frequency Ordered Stop   12/16/16 0800  ceFEPIme (MAXIPIME) 1 g in dextrose 5 % 50 mL IVPB     1 g 100 mL/hr over 30 Minutes Intravenous Every 24 hours 12/15/16 1808         Subjective: Lying in bed, no complaints  Objective: Vitals:   12/19/16 2054 12/19/16 2100 12/20/16 0601 12/20/16 1429  BP:  (!) 153/81 (!) 148/68 (!) 150/56  Pulse:  71 68 69  Resp:  18 18 18   Temp:  98 F (36.7 C) 98.2 F (36.8 C) 98.8 F (37.1 C)  TempSrc:  Oral Oral Oral  SpO2: 92% 93% 97% 99%  Weight:   102.9 kg (226 lb 13.7 oz)   Height:         Intake/Output Summary (Last 24 hours) at 12/20/16 1548 Last data filed at 12/20/16 0605  Gross per 24 hour  Intake                0 ml  Output             2900 ml  Net            -2900 ml   Filed Weights   12/18/16 0500 12/19/16 0537 12/20/16 0601  Weight: 103.8 kg (228 lb 12.8 oz) 102.9 kg (226 lb 13.7 oz) 102.9 kg (226 lb 13.7 oz)    Examination:  General exam: Alert, awake, oriented x 3 Respiratory system: Clear to auscultation. Respiratory effort normal. Cardiovascular system:RRR. No murmurs, rubs, gallops. Gastrointestinal system: Abdomen is nondistended, soft and nontender. No organomegaly or masses felt. Normal bowel sounds heard. Central nervous system: Alert and oriented. No focal neurological deficits. Extremities: No C/C/E, +pedal pulses Skin: No rashes, lesions or ulcers Psychiatry: Judgement and insight appear normal. Mood & affect appropriate.       Data Reviewed: I have personally reviewed following labs and imaging studies  CBC:  Recent Labs Lab 12/15/16 1350 12/16/16 0609 12/17/16 0559 12/20/16 0522  WBC 7.8 7.3 9.2 10.1  NEUTROABS 4.6  --   --   --   HGB 9.9* 8.7* 9.3* 8.3*  HCT 31.0* 28.1* 28.9* 25.5*  MCV 88.6 90.4 89.2 88.5  PLT 214 185 201 034   Basic Metabolic Panel:  Recent Labs Lab 12/16/16 0609 12/17/16 0559 12/18/16 0550 12/19/16 0621 12/20/16 0522  NA 136 138 137 138 139  K 4.8 5.1 5.5* 5.0 4.7  CL 107 112* 111 110 109  CO2 21* 19* 20* 19* 20*  GLUCOSE 117* 97 104* 107* 96  BUN 64* 61* 59* 61* 66*  CREATININE 4.64* 4.51* 4.55* 4.61* 5.21*  CALCIUM 8.3* 8.3* 8.3* 8.3* 8.3*  PHOS  --  4.7*  --  5.5* 5.8*   GFR: Estimated Creatinine Clearance: 11.1 mL/min (A) (by C-G formula based on SCr of 5.21 mg/dL (H)). Liver Function Tests:  Recent Labs Lab 12/16/16 0609 12/17/16 0559 12/19/16 0621 12/20/16 0522  AST 18  --   --   --   ALT 13*  --   --   --   ALKPHOS 86  --   --   --   BILITOT 0.5  --   --   --   PROT 6.6   --   --   --   ALBUMIN 2.5* 2.6* 2.6* 2.5*   No results for input(s): LIPASE, AMYLASE in the last 168 hours. No results for input(s): AMMONIA in the last 168 hours. Coagulation Profile: No results for input(s): INR, PROTIME in the last 168 hours. Cardiac Enzymes: No results for input(s): CKTOTAL, CKMB, CKMBINDEX, TROPONINI in the last 168 hours. BNP (last 3 results) No results for input(s): PROBNP in the last 8760 hours. HbA1C: No results for input(s): HGBA1C in the last 72 hours. CBG:  Recent Labs Lab 12/17/16 0729 12/17/16 1106 12/17/16 1641  GLUCAP 101* 103* 90   Lipid Profile: No results for input(s): CHOL, HDL, LDLCALC, TRIG, CHOLHDL, LDLDIRECT in the last 72 hours. Thyroid Function Tests: No results for input(s): TSH, T4TOTAL, FREET4, T3FREE, THYROIDAB in the last 72 hours. Anemia Panel: No results for input(s): VITAMINB12, FOLATE, FERRITIN, TIBC, IRON, RETICCTPCT in the last 72 hours. Urine analysis:    Component Value Date/Time   COLORURINE YELLOW 12/15/2016 1247   APPEARANCEUR CLEAR 12/15/2016 1247   LABSPEC 1.011 12/15/2016 1247   PHURINE 5.0 12/15/2016 1247   GLUCOSEU NEGATIVE 12/15/2016 1247   HGBUR SMALL (A) 12/15/2016 1247   BILIRUBINUR NEGATIVE 12/15/2016 1247   KETONESUR NEGATIVE 12/15/2016 1247   PROTEINUR 30 (A) 12/15/2016 1247   UROBILINOGEN 0.2 08/25/2012 1408   NITRITE NEGATIVE 12/15/2016 1247   LEUKOCYTESUR NEGATIVE 12/15/2016 1247   Sepsis Labs: @LABRCNTIP (procalcitonin:4,lacticidven:4)  ) Recent Results (from the past 240 hour(s))  MRSA PCR Screening     Status: None   Collection Time: 12/15/16  6:54 PM  Result Value Ref Range Status   MRSA by PCR NEGATIVE NEGATIVE Final    Comment:        The GeneXpert MRSA Assay (FDA approved for NASAL specimens only), is one component of a comprehensive MRSA colonization surveillance program. It is not intended to diagnose MRSA infection nor to guide or monitor treatment for MRSA infections.           Radiology Studies: No results found.      Scheduled Meds: . atenolol  25 mg Oral Daily  . furosemide  60 mg Intravenous BID  . heparin  5,000 Units Subcutaneous Q8H  . sodium bicarbonate  650 mg Oral BID   Continuous Infusions: . sodium chloride 135 mL/hr at 12/20/16 0840  . ceFEPIme (MAXIPIME) 1 GM IVPB Stopped (12/20/16  0910)     LOS: 5 days    Time spent: 25 minutes. Greater than 50% of this time was spent in direct contact with the patient coordinating care.     Lelon Frohlich, MD [facility] Pager [phone]  If 7PM-7AM, please contact night-coverage [email or web address] Password [facility] 12/20/2016, 3:48 PM

## 2016-12-21 LAB — RENAL FUNCTION PANEL
ANION GAP: 9 (ref 5–15)
Albumin: 2.5 g/dL — ABNORMAL LOW (ref 3.5–5.0)
BUN: 67 mg/dL — ABNORMAL HIGH (ref 6–20)
CHLORIDE: 109 mmol/L (ref 101–111)
CO2: 23 mmol/L (ref 22–32)
Calcium: 8.1 mg/dL — ABNORMAL LOW (ref 8.9–10.3)
Creatinine, Ser: 5.13 mg/dL — ABNORMAL HIGH (ref 0.44–1.00)
GFR calc Af Amer: 9 mL/min — ABNORMAL LOW (ref >60)
GFR calc non Af Amer: 7 mL/min — ABNORMAL LOW (ref >60)
GLUCOSE: 102 mg/dL — AB (ref 65–99)
POTASSIUM: 4.2 mmol/L (ref 3.5–5.1)
Phosphorus: 5.9 mg/dL — ABNORMAL HIGH (ref 2.5–4.6)
Sodium: 141 mmol/L (ref 135–145)

## 2016-12-21 LAB — IRON AND TIBC
Iron: 37 ug/dL (ref 28–170)
Saturation Ratios: 19 % (ref 10.4–31.8)
TIBC: 196 ug/dL — ABNORMAL LOW (ref 250–450)
UIBC: 159 ug/dL

## 2016-12-21 LAB — CBC
HEMATOCRIT: 26.6 % — AB (ref 36.0–46.0)
HEMOGLOBIN: 8.6 g/dL — AB (ref 12.0–15.0)
MCH: 28.6 pg (ref 26.0–34.0)
MCHC: 32.3 g/dL (ref 30.0–36.0)
MCV: 88.4 fL (ref 78.0–100.0)
Platelets: 191 10*3/uL (ref 150–400)
RBC: 3.01 MIL/uL — AB (ref 3.87–5.11)
RDW: 15.6 % — ABNORMAL HIGH (ref 11.5–15.5)
WBC: 8.4 10*3/uL (ref 4.0–10.5)

## 2016-12-21 LAB — FERRITIN: FERRITIN: 457 ng/mL — AB (ref 11–307)

## 2016-12-21 NOTE — Progress Notes (Signed)
PROGRESS NOTE    Laura Chambers  ZJI:[id number] DOB: December 24, 1940 DOA: 12/15/2016 PCP: Lelon Perla, MD     Brief Narrative:  76 y/o woman admitted from SNF on 6/11 where she is undergoing rehab following a left knee arthroplasty with placement of abx spacer. Sent for admission due to acute renal failure.   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 -Cr has decreased slightly today to 5.1, hopefully is stabilizing and will start to improve soon. -Etiology unclear. -Renal on board. -Continue lasix 60 BID. -started on bicarb tablets given CO2 of 19 today. -no indication for acute dialysis  Infected Prosthetic Knee Joint -Continue cefepime with planned stop date of 6/20. -Has follow up with ortho scheduled for 6/22.  HTN -Fair control. -Continue atenolol.   DVT prophylaxis: SQ heparin Code Status: full code Family Communication: patient only Disposition Plan: back to SNF once medically ready  Consultants:   Nephrology  Procedures:   None  Antimicrobials:  Anti-infectives    Start     Dose/Rate Route Frequency Ordered Stop   12/16/16 0800  ceFEPIme (MAXIPIME) 1 g in dextrose 5 % 50 mL IVPB     1 g 100 mL/hr over 30 Minutes Intravenous Every 24 hours 12/15/16 1808         Subjective: Lying in bed, no complaints  Objective: Vitals:   12/20/16 2026 12/20/16 2142 12/21/16 0500 12/21/16 1344  BP:  (!) 166/71 (!) 154/70 (!) 137/52  Pulse:  73 70 65  Resp:  18 18 15   Temp:  97.9 F (36.6 C) 97.5 F (36.4 C) 98.3 F (36.8 C)  TempSrc:  Oral Oral Oral  SpO2: 91% 95% 92% 95%  Weight:   102.4 kg (225 lb 12.8 oz)   Height:        Intake/Output Summary (Last 24 hours) at 12/21/16 1446 Last data filed at 12/21/16 1345  Gross per 24 hour    Intake              480 ml  Output             3450 ml  Net            -2970 ml   Filed Weights   12/19/16 0537 12/20/16 0601 12/21/16 0500  Weight: 102.9 kg (226 lb 13.7 oz) 102.9 kg (226 lb 13.7 oz) 102.4 kg (225 lb 12.8 oz)    Examination:  General exam: Alert, awake, oriented x 3 Respiratory system: Clear to auscultation. Respiratory effort normal. Cardiovascular system:RRR. No murmurs, rubs, gallops. Gastrointestinal system: Abdomen is nondistended, soft and nontender. No organomegaly or masses felt. Normal bowel sounds heard. Central nervous system: Alert and oriented. No focal neurological deficits. Extremities: left knee edema, TKA scar Skin: No rashes, lesions or ulcers Psychiatry: Judgement and insight appear normal. Mood & affect appropriate.        Data Reviewed: I have personally reviewed following labs and imaging studies  CBC:  Recent Labs Lab 12/15/16 1350 12/16/16 0609 12/17/16 0559 12/20/16 0522 12/21/16 0522  WBC 7.8 7.3 9.2 10.1 8.4  NEUTROABS 4.6  --   --   --   --   HGB 9.9* 8.7* 9.3* 8.3* 8.6*  HCT 31.0* 28.1* 28.9* 25.5* 26.6*  MCV 88.6 90.4 89.2 88.5 88.4  PLT 214 185 201 184 916   Basic Metabolic Panel:  Recent Labs Lab 12/17/16 0559 12/18/16 0550 12/19/16 0621 12/20/16 0522 12/21/16 0522  NA 138 137 138 139 141  K 5.1 5.5* 5.0 4.7 4.2  CL 112* 111 110 109 109  CO2 19* 20* 19* 20* 23  GLUCOSE 97 104* 107* 96 102*  BUN 61* 59* 61* 66* 67*  CREATININE 4.51* 4.55* 4.61* 5.21* 5.13*  CALCIUM 8.3* 8.3* 8.3* 8.3* 8.1*  PHOS 4.7*  --  5.5* 5.8* 5.9*   GFR: Estimated Creatinine Clearance: 11.3 mL/min (A) (by C-G formula based on SCr of 5.13 mg/dL (H)). Liver Function Tests:  Recent Labs Lab 12/16/16 0609 12/17/16 0559 12/19/16 0621 12/20/16 0522 12/21/16 0522  AST 18  --   --   --   --   ALT 13*  --   --   --   --   ALKPHOS 86  --   --   --   --   BILITOT 0.5  --   --   --   --   PROT 6.6  --   --   --   --   ALBUMIN  2.5* 2.6* 2.6* 2.5* 2.5*   No results for input(s): LIPASE, AMYLASE in the last 168 hours. No results for input(s): AMMONIA in the last 168 hours. Coagulation Profile: No results for input(s): INR, PROTIME in the last 168 hours. Cardiac Enzymes: No results for input(s): CKTOTAL, CKMB, CKMBINDEX, TROPONINI in the last 168 hours. BNP (last 3 results) No results for input(s): PROBNP in the last 8760 hours. HbA1C: No results for input(s): HGBA1C in the last 72 hours. CBG:  Recent Labs Lab 12/17/16 0729 12/17/16 1106 12/17/16 1641  GLUCAP 101* 103* 90   Lipid Profile: No results for input(s): CHOL, HDL, LDLCALC, TRIG, CHOLHDL, LDLDIRECT in the last 72 hours. Thyroid Function Tests: No results for input(s): TSH, T4TOTAL, FREET4, T3FREE, THYROIDAB in the last 72 hours. Anemia Panel:  Recent Labs  12/21/16 0522  FERRITIN 457*  TIBC 196*  IRON 37   Urine analysis:    Component Value Date/Time   COLORURINE YELLOW 12/15/2016 1247   APPEARANCEUR CLEAR 12/15/2016 1247   LABSPEC 1.011 12/15/2016 1247   PHURINE 5.0 12/15/2016 1247   GLUCOSEU NEGATIVE 12/15/2016 1247   HGBUR SMALL (A) 12/15/2016 1247   BILIRUBINUR NEGATIVE 12/15/2016 1247   KETONESUR NEGATIVE 12/15/2016 1247   PROTEINUR 30 (A) 12/15/2016 1247   UROBILINOGEN 0.2 08/25/2012 1408   NITRITE NEGATIVE 12/15/2016 1247   LEUKOCYTESUR NEGATIVE 12/15/2016 1247   Sepsis Labs: @LABRCNTIP (procalcitonin:4,lacticidven:4)  ) Recent Results (from the past 240 hour(s))  MRSA PCR Screening     Status: None   Collection Time: 12/15/16  6:54 PM  Result Value Ref Range Status   MRSA by PCR NEGATIVE NEGATIVE Final    Comment:        The GeneXpert MRSA Assay (FDA approved for NASAL specimens only), is one component of a comprehensive MRSA colonization surveillance program. It is not intended to diagnose MRSA infection nor to guide or monitor treatment for MRSA infections.          Radiology Studies: No results  found.      Scheduled Meds: . atenolol  25 mg Oral Daily  . furosemide  60 mg Intravenous BID  . heparin  5,000 Units Subcutaneous Q8H  . sodium bicarbonate  650 mg Oral BID   Continuous Infusions: . sodium chloride 135 mL/hr at 12/21/16 0940  .  ceFEPIme (MAXIPIME) 1 GM IVPB Stopped (12/21/16 0900)     LOS: 6 days    Time spent: 25 minutes. Greater than 50% of this time was spent in direct contact with the patient coordinating care.     Lelon Frohlich, MD [facility] Pager [phone]  If 7PM-7AM, please contact night-coverage [email or web address] Password [facility] 12/21/2016, 2:46 PM

## 2016-12-21 NOTE — Progress Notes (Signed)
Subjective: Interval History:  The patient offers no complaints except lack of sleep last night. Overall she's feeling much better.  Objective: Vital signs in last 24 hours: Temp:  [97.5 F (36.4 C)-98.8 F (37.1 C)] 97.5 F (36.4 C) (06/17 0500) Pulse Rate:  [69-73] 70 (06/17 0500) Resp:  [18] 18 (06/17 0500) BP: (150-166)/(56-71) 154/70 (06/17 0500) SpO2:  [91 %-99 %] 92 % (06/17 0500) Weight:  [102.4 kg (225 lb 12.8 oz)] 102.4 kg (225 lb 12.8 oz) (06/17 0500) Weight change: -0.478 kg (-1 lb 0.9 oz)  Intake/Output from previous day: 06/16 0701 - 06/17 0700 In: -  Out: 2000 [Urine:2000] Intake/Output this shift: No intake/output data recorded.  General appearance: alert, cooperative and no distress Resp: clear to auscultation bilaterally Cardio: regular rate and rhythm Extremities: edema She has trace to 1+ edema right greater than left  Lab Results:  Recent Labs  12/20/16 0522 12/21/16 0522  WBC 10.1 8.4  HGB 8.3* 8.6*  HCT 25.5* 26.6*  PLT 184 191   BMET:   Recent Labs  12/20/16 0522 12/21/16 0522  NA 139 141  K 4.7 4.2  CL 109 109  CO2 20* 23  GLUCOSE 96 102*  BUN 66* 67*  CREATININE 5.21* 5.13*  CALCIUM 8.3* 8.1*   No results for input(s): PTH in the last 72 hours. Iron Studies: No results for input(s): IRON, TIBC, TRANSFERRIN, FERRITIN in the last 72 hours.  Studies/Results: No results found.  I have reviewed the patient's current medications.  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 Patient remained nonoliguric. She has 2000 mL of urine output. Her creatinine today show slight decline hence her kidney function may be stabilizing. Problem #2 hypertension: Her blood pressure is reasonably controlled Problem #3 anemia: Her hemoglobin is low but better. Iron studies are pending Problem #4 hypertension: Her blood  pressure is reasonably controlled Problem #5 hyperkalemia: Most likely from high potassium intake and worsening of renal failure. Her potassium is normal Problem #6 history of infected prosthesis: Patient on antibiotics. She is afebrile and her white blood cell count is normal. Problem #7 bone and mineral  disorder: Her calcium is in range but her phosphorus is slightly high. PTH and vitamin D level is pending. Problem #8 low CO2 possibly metabolic. Patient is started on sodium bicarbonate. Her CO2 is 23. Plan: 1] we will continue his present management 2] we will check her renal panel in the morning   LOS: 6 days   Consuello Lassalle S 12/21/2016,8:31 AM

## 2016-12-22 LAB — RENAL FUNCTION PANEL
Albumin: 2.5 g/dL — ABNORMAL LOW (ref 3.5–5.0)
Anion gap: 11 (ref 5–15)
BUN: 63 mg/dL — AB (ref 6–20)
CHLORIDE: 108 mmol/L (ref 101–111)
CO2: 23 mmol/L (ref 22–32)
Calcium: 8.3 mg/dL — ABNORMAL LOW (ref 8.9–10.3)
Creatinine, Ser: 5.18 mg/dL — ABNORMAL HIGH (ref 0.44–1.00)
GFR calc Af Amer: 8 mL/min — ABNORMAL LOW (ref >60)
GFR calc non Af Amer: 7 mL/min — ABNORMAL LOW (ref >60)
GLUCOSE: 97 mg/dL (ref 65–99)
POTASSIUM: 4 mmol/L (ref 3.5–5.1)
Phosphorus: 6.2 mg/dL — ABNORMAL HIGH (ref 2.5–4.6)
Sodium: 142 mmol/L (ref 135–145)

## 2016-12-22 LAB — PTH, INTACT AND CALCIUM
CALCIUM TOTAL (PTH): 8 mg/dL — AB (ref 8.7–10.3)
PTH: 86 pg/mL — ABNORMAL HIGH (ref 15–65)

## 2016-12-22 LAB — VITAMIN D 25 HYDROXY (VIT D DEFICIENCY, FRACTURES): Vit D, 25-Hydroxy: 18 ng/mL — ABNORMAL LOW (ref 30.0–100.0)

## 2016-12-22 NOTE — Progress Notes (Signed)
PROGRESS NOTE    Laura Chambers  DJS:[id number] DOB: Jul 25, 1940 DOA: 12/15/2016 PCP: Lelon Perla, MD     Brief Narrative:  76 y/o woman admitted from SNF on 6/11 where she is undergoing rehab following a left knee arthroplasty with placement of abx spacer. Sent for admission due to acute renal failure.   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 -Cr has stabilized at around 5.1. -Etiology unclear. -Renal on board. Recommendations today are to stop lasix and continue IVF. -started on bicarb tablets. -no indication for acute dialysis  Infected Prosthetic Knee Joint -Continue cefepime with planned stop date of 6/20. -Has follow up with ortho scheduled for 6/22.  HTN -Fair control. -Continue atenolol.   DVT prophylaxis: SQ heparin Code Status: full code Family Communication: patient only Disposition Plan: back to SNF once medically ready  Consultants:   Nephrology  Procedures:   None  Antimicrobials:  Anti-infectives    Start     Dose/Rate Route Frequency Ordered Stop   12/16/16 0800  ceFEPIme (MAXIPIME) 1 g in dextrose 5 % 50 mL IVPB     1 g 100 mL/hr over 30 Minutes Intravenous Every 24 hours 12/15/16 1808         Subjective: Lying in bed, no complaints, feels well  Objective: Vitals:   12/21/16 2128 12/22/16 0552 12/22/16 1015 12/22/16 1539  BP: (!) 164/62 (!) 134/54  (!) 178/74  Pulse: 64 62  67  Resp: 16 16  18   Temp: 98.6 F (37 C) 98.1 F (36.7 C)  97.8 F (36.6 C)  TempSrc: Oral Oral  Oral  SpO2: 95% 92% 94% 97%  Weight:  101.6 kg (224 lb)    Height:        Intake/Output Summary (Last 24 hours) at 12/22/16 1643 Last data filed at 12/22/16 1238  Gross per 24 hour  Intake              720 ml  Output             3550 ml    Net            -2830 ml   Filed Weights   12/20/16 0601 12/21/16 0500 12/22/16 0552  Weight: 102.9 kg (226 lb 13.7 oz) 102.4 kg (225 lb 12.8 oz) 101.6 kg (224 lb)    Examination:  General exam: Alert, awake, oriented x 3, obese Respiratory system: Clear to auscultation. Respiratory effort normal. Cardiovascular system:RRR. No murmurs, rubs, gallops. Gastrointestinal system: Abdomen is nondistended, soft and nontender. No organomegaly or masses felt. Normal bowel sounds heard. Central nervous system: Alert and oriented. No focal neurological deficits. Extremities: left knee edema and TKA scarring Skin: No rashes, lesions or ulcers Psychiatry: Judgement and insight appear normal. Mood & affect appropriate.          Data Reviewed: I have personally reviewed following labs and imaging studies  CBC:  Recent Labs Lab 12/16/16 0609 12/17/16 0559 12/20/16 0522 12/21/16 0522  WBC 7.3 9.2 10.1 8.4  HGB 8.7* 9.3* 8.3* 8.6*  HCT 28.1* 28.9* 25.5* 26.6*  MCV 90.4 89.2 88.5 88.4  PLT 185 201 184 885   Basic Metabolic Panel:  Recent Labs Lab 12/17/16 0559 12/18/16 0550 12/19/16 0277 12/20/16 0522 12/21/16 0522 12/22/16 0506  NA  138 137 138 139 141 142  K 5.1 5.5* 5.0 4.7 4.2 4.0  CL 112* 111 110 109 109 108  CO2 19* 20* 19* 20* 23 23  GLUCOSE 97 104* 107* 96 102* 97  BUN 61* 59* 61* 66* 67* 63*  CREATININE 4.51* 4.55* 4.61* 5.21* 5.13* 5.18*  CALCIUM 8.3* 8.3* 8.3* 8.3* 8.1*  8.0* 8.3*  PHOS 4.7*  --  5.5* 5.8* 5.9* 6.2*   GFR: Estimated Creatinine Clearance: 11.1 mL/min (A) (by C-G formula based on SCr of 5.18 mg/dL (H)). Liver Function Tests:  Recent Labs Lab 12/16/16 0609 12/17/16 0559 12/19/16 0621 12/20/16 0522 12/21/16 0522 12/22/16 0506  AST 18  --   --   --   --   --   ALT 13*  --   --   --   --   --   ALKPHOS 86  --   --   --   --   --   BILITOT 0.5  --   --   --   --   --   PROT 6.6  --   --   --   --   --   ALBUMIN 2.5* 2.6* 2.6* 2.5* 2.5*  2.5*   No results for input(s): LIPASE, AMYLASE in the last 168 hours. No results for input(s): AMMONIA in the last 168 hours. Coagulation Profile: No results for input(s): INR, PROTIME in the last 168 hours. Cardiac Enzymes: No results for input(s): CKTOTAL, CKMB, CKMBINDEX, TROPONINI in the last 168 hours. BNP (last 3 results) No results for input(s): PROBNP in the last 8760 hours. HbA1C: No results for input(s): HGBA1C in the last 72 hours. CBG:  Recent Labs Lab 12/17/16 0729 12/17/16 1106 12/17/16 1641  GLUCAP 101* 103* 90   Lipid Profile: No results for input(s): CHOL, HDL, LDLCALC, TRIG, CHOLHDL, LDLDIRECT in the last 72 hours. Thyroid Function Tests: No results for input(s): TSH, T4TOTAL, FREET4, T3FREE, THYROIDAB in the last 72 hours. Anemia Panel:  Recent Labs  12/21/16 0522  FERRITIN 457*  TIBC 196*  IRON 37   Urine analysis:    Component Value Date/Time   COLORURINE YELLOW 12/15/2016 1247   APPEARANCEUR CLEAR 12/15/2016 1247   LABSPEC 1.011 12/15/2016 1247   PHURINE 5.0 12/15/2016 1247   GLUCOSEU NEGATIVE 12/15/2016 1247   HGBUR SMALL (A) 12/15/2016 1247   BILIRUBINUR NEGATIVE 12/15/2016 1247   KETONESUR NEGATIVE 12/15/2016 1247   PROTEINUR 30 (A) 12/15/2016 1247   UROBILINOGEN 0.2 08/25/2012 1408   NITRITE NEGATIVE 12/15/2016 1247   LEUKOCYTESUR NEGATIVE 12/15/2016 1247   Sepsis Labs: @LABRCNTIP (procalcitonin:4,lacticidven:4)  ) Recent Results (from the past 240 hour(s))  MRSA PCR Screening     Status: None   Collection Time: 12/15/16  6:54 PM  Result Value Ref Range Status   MRSA by PCR NEGATIVE NEGATIVE Final    Comment:        The GeneXpert MRSA Assay (FDA approved for NASAL specimens only), is one component of a comprehensive MRSA colonization surveillance program. It is not intended to diagnose MRSA infection nor to guide or monitor treatment for MRSA infections.          Radiology Studies: No results  found.      Scheduled Meds: . atenolol  25 mg Oral Daily  . heparin  5,000 Units Subcutaneous Q8H  . sodium bicarbonate  650 mg Oral BID   Continuous Infusions: . sodium chloride 135 mL/hr at 12/22/16 0133  . ceFEPIme (MAXIPIME) 1 GM IVPB Stopped (  12/22/16 0915)     LOS: 7 days    Time spent: 25 minutes. Greater than 50% of this time was spent in direct contact with the patient coordinating care.     Lelon Frohlich, MD [facility] Pager [phone]  If 7PM-7AM, please contact night-coverage [email or web address] Password [facility] 12/22/2016, 4:43 PM

## 2016-12-22 NOTE — Progress Notes (Signed)
Subjective: Interval History:  Patient complains of stuffiness of the nose. She has some nausea but no vomiting. Objective: Vital signs in last 24 hours: Temp:  [98.1 F (36.7 C)-98.6 F (37 C)] 98.1 F (36.7 C) (06/18 0552) Pulse Rate:  [62-65] 62 (06/18 0552) Resp:  [15-16] 16 (06/18 0552) BP: (134-164)/(52-62) 134/54 (06/18 0552) SpO2:  [92 %-95 %] 92 % (06/18 0552) Weight:  [101.6 kg (224 lb)] 101.6 kg (224 lb) (06/18 0552) Weight change: -0.816 kg (-1 lb 12.8 oz)  Intake/Output from previous day: 06/17 0701 - 06/18 0700 In: 720 [P.O.:720] Out: 4150 [Urine:4150] Intake/Output this shift: No intake/output data recorded.  General appearance: alert, cooperative and no distress Resp: clear to auscultation bilaterally Cardio: regular rate and rhythm Extremities: edema She has trace to 1+ edema right greater than left  Lab Results:  Recent Labs  12/20/16 0522 12/21/16 0522  WBC 10.1 8.4  HGB 8.3* 8.6*  HCT 25.5* 26.6*  PLT 184 191   BMET:   Recent Labs  12/21/16 0522 12/22/16 0506  NA 141 142  K 4.2 4.0  CL 109 108  CO2 23 23  GLUCOSE 102* 97  BUN 67* 63*  CREATININE 5.13* 5.18*  CALCIUM 8.1* 8.3*   No results for input(Chambers): PTH in the last 72 hours. Iron Studies:   Recent Labs  12/21/16 0522  IRON 37  TIBC 196*  FERRITIN 457*    Studies/Results: No results found.  I have reviewed the patient'Chambers current medications.  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 Patient had 4100 mL of urine output. Her creatinine seems to be stabilizing. Presently she is still on Lasix. Problem #2 hypertension: Her blood pressure is reasonably controlled Problem #3 anemia: Her hemoglobin is low but better. Ferritin and iron saturation is reasonable. Hence anemia of chronic disease. Problem #4 hypertension: Her blood pressure is reasonably  controlled Problem #5 hyperkalemia: Her potassium is corrected. Problem #6 history of infected prosthesis: Patient on antibiotics. She is afebrile and her white blood cell count is normal. Problem #7 bone and mineral  disorder: Her calcium is in range but her phosphorus is  high. PTH and vitamin D level is pending. Problem #8 low CO2 possibly metabolic. Patient is started on sodium bicarbonate. Her CO2 is 23. Plan: 1] we will DC Lasix 2] we'll continue his hydration 3] we will check her renal panel in the morning   LOS: 7 days   Laura Chambers 12/22/2016,7:12 AM

## 2016-12-22 NOTE — Progress Notes (Signed)
Pharmacy Antibiotic Note  Laura Chambers is a 76 y.o. female admitted on 12/15/2016 with AKI. Antibiotic tx to be continued with Cefepime for S/P left knee replacement excision and placement of spacer. Treatment to be completed 6/20.  Plan: Continue Cefepime 1g IV q24h Monitor V/S and labs and progress  Height: 5\' 6"  (167.6 cm) Weight: 224 lb (101.6 kg) IBW/kg (Calculated) : 59.3  Temp (24hrs), Avg:98.3 F (36.8 C), Min:98.1 F (36.7 C), Max:98.6 F (37 C)   Recent Labs Lab 12/15/16 1350  12/16/16 0609 12/17/16 0559 12/18/16 0550 12/19/16 0621 12/20/16 0522 12/21/16 0522 12/22/16 0506  WBC 7.8  --  7.3 9.2  --   --  10.1 8.4  --   CREATININE  --   < > 4.64* 4.51* 4.55* 4.61* 5.21* 5.13* 5.18*  < > = values in this interval not displayed.  Estimated Creatinine Clearance: 11.1 mL/min (A) (by C-G formula based on SCr of 5.18 mg/dL (H)).    Allergies  Allergen Reactions  . Epinephrine Other (See Comments)    Reaction:  Increased pts BP and caused shoulder to jerk   . Levofloxacin Nausea And Vomiting  . Lipitor [Atorvastatin] Other (See Comments)    Reaction:  Joint pain and dizziness   . Trovan [Alatrofloxacin] Nausea And Vomiting    Pt states that this med caused pancreatitis.    Antimicrobials this admission:  Cefepime 6/12 >>   Dose adjustments this admission:  Cefepime adjusted for Estimated Creatinine Clearance: 11.1 mL/min (A) (by C-G formula based on SCr of 5.18 mg/dL (H)).   Microbiology results:   BCx:  6/5 UCx: (-)   Sputum:   6/11 MRSA PCR: (-)   Thank you for allowing pharmacy to be a part of this patient's care.  Pricilla Larsson 12/22/2016 12:26 PM

## 2016-12-22 NOTE — Progress Notes (Signed)
Physical Therapy Treatment Patient Details Name: Laura Chambers MRN: [id number] DOB: [birth year] Today's Date: 12/22/2016    History of Present Illness Laura Chambers is a 76yo white female who comes to [facility] from [facility] due to declining kidney function, admitted with AKI. Pt has been at [facility] on IV ABX since hardware removal from remote Left TKA and placement of ABX spacer. Has been receiving rehab at [facility] as well to address mobility deficits. She remains Lt TTWB, and revision surgery has not been scheduled at this point. Prior to sepsis in May, pt was a Corporate investment banker adult, still working, AMB withotu restriction or AD, living alone. 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     PT Comments    Session heavily limited by lethargy today, pt demonstratign difficulty counting reps during exercises and later falling asleep between reps. Pt reports she feels more week than when she arrived. Pt appears to have declined in strength and functional mobility since first evaluated 5DA, but it is unclear how much of this is related to decreased alertness. RN reminded of pt's knee brace required for OOB. LLE propped to maintain TKE and elevate to help with edema. Pt may benefit from compression hose due to low mobility. Asked RN to help patient to chair for lunch and/or dinner later today. PT will contact Dr. Zollie Beckers for clarifying details regarding Left knee precautions.     Follow Up Recommendations  SNF     Equipment Recommendations  None recommended by PT    Recommendations for Other Services OT consult     Precautions / Restrictions Precautions Precautions: Fall;Knee Required Braces or Orthoses:  (Left knee brace) Knee Immobilizer - Left: On when out of bed or walking Restrictions Weight Bearing Restrictions: Yes LLE Weight Bearing: Touchdown weight bearing    Mobility  Bed Mobility               General bed mobility comments: pt refuses OOB at this time, saying she feels week and tired.   Transfers                    Ambulation/Gait                 Stairs            Wheelchair Mobility    Modified Rankin (Stroke Patients Only)       Balance                                            Cognition Arousal/Alertness: Lethargic Behavior During Therapy: WFL for tasks assessed/performed Overall Cognitive Status: Impaired/Different from baseline Area of Impairment: Following commands;Attention                       Following Commands: Follows one step commands with increased time;Follows one step commands inconsistently (falling asleep during exercises )     Problem Solving: Slow processing;Decreased initiation;Requires verbal cues;Requires tactile cues        Exercises General Exercises - Lower Extremity Ankle Circles/Pumps: 15 reps;Supine;Both (manually ressited plantar flexion; pt with bilat foot drop) Short Arc Quad: Right;15 reps;Supine;AROM Heel Slides: AROM;Right;15 reps;Supine Hip ABduction/ADduction: AROM;AAROM;Right;Left;15 reps;Supine Straight Leg Raises: AROM;AAROM;Right;Left;10 reps;Supine    General Comments        Pertinent Vitals/Pain  Pain Assessment: No/denies pain    Home Living                      Prior Function            PT Goals (current goals can now be found in the care plan section) Acute Rehab PT Goals Patient Stated Goal: return to home when able  PT Goal Formulation: With patient Time For Goal Achievement: 12/31/16 Potential to Achieve Goals: Good Progress towards PT goals: PT to reassess next treatment (unable to assess due to lethargy )    Frequency    Min 2X/week      PT Plan Current plan remains appropriate    Co-evaluation              AM-PAC PT "6 Clicks" Daily Activity  Outcome Measure  Difficulty turning over in bed (including  adjusting bedclothes, sheets and blankets)?: A Lot Difficulty moving from lying on back to sitting on the side of the bed? : A Lot Difficulty sitting down on and standing up from a chair with arms (e.g., wheelchair, bedside commode, etc,.)?: A Lot Help needed moving to and from a bed to chair (including a wheelchair)?: Total Help needed walking in hospital room?: Total Help needed climbing 3-5 steps with a railing? : Total 6 Click Score: 9    End of Session   Activity Tolerance: Patient limited by lethargy Patient left: in bed;with call bell/phone within reach Nurse Communication: Mobility status (pt needs OOB daily, and brace for left knee when OOB) PT Visit Diagnosis: Difficulty in walking, not elsewhere classified (R26.2);Muscle weakness (generalized) (M62.81)     Time: 9562-1308 PT Time Calculation (min) (ACUTE ONLY): 25 min  Charges:  $Therapeutic Activity: 23-37 mins                    G Codes:       10:53 AM, 08-Jan-2017 [name], PT, DPT Physical Therapist - [place] [phone] (Office)    Rakim Moone C 2017-01-08, 10:49 AM

## 2016-12-22 NOTE — Progress Notes (Signed)
LCSW following for discharge planning: patient from [facility]  LCSW received call from [facility] regarding insurance authorization. Updated Izora Gala that patient remains in hospital and additional PT notes.  Izora Gala understanding and following patient for SNF approval once medically stable.  Will continue to follow and assist with needs.  Lane Hacker, MSW Clinical Social Work: [employer] Coverage for :  [phone]

## 2016-12-23 LAB — RENAL FUNCTION PANEL
ALBUMIN: 2.5 g/dL — AB (ref 3.5–5.0)
ANION GAP: 10 (ref 5–15)
BUN: 61 mg/dL — AB (ref 6–20)
CALCIUM: 8.1 mg/dL — AB (ref 8.9–10.3)
CO2: 21 mmol/L — ABNORMAL LOW (ref 22–32)
CREATININE: 5.18 mg/dL — AB (ref 0.44–1.00)
Chloride: 111 mmol/L (ref 101–111)
GFR calc Af Amer: 8 mL/min — ABNORMAL LOW (ref >60)
GFR calc non Af Amer: 7 mL/min — ABNORMAL LOW (ref >60)
GLUCOSE: 103 mg/dL — AB (ref 65–99)
PHOSPHORUS: 5.7 mg/dL — AB (ref 2.5–4.6)
Potassium: 3.9 mmol/L (ref 3.5–5.1)
SODIUM: 142 mmol/L (ref 135–145)

## 2016-12-23 MED ORDER — ATENOLOL 25 MG PO TABS
50.0000 mg | ORAL_TABLET | Freq: Every day | ORAL | Status: DC
  Administered 2016-12-23 – 2017-01-01 (×10): 50 mg via ORAL
  Filled 2016-12-23 (×10): qty 2

## 2016-12-23 MED ORDER — TRAZODONE HCL 50 MG PO TABS
25.0000 mg | ORAL_TABLET | Freq: Once | ORAL | Status: AC
Start: 2016-12-23 — End: 2016-12-23
  Administered 2016-12-23: 25 mg via ORAL
  Filled 2016-12-23: qty 1

## 2016-12-23 NOTE — Progress Notes (Signed)
PROGRESS NOTE    Laura Chambers  PJA:[id number] DOB: [birth year] DOA: 12/15/2016 PCP: Lelon Perla, MD     Brief Narrative:  76 y/o woman admitted from SNF on 6/11 where she is undergoing rehab following a left knee arthroplasty with placement of abx spacer. Sent for admission due to acute renal failure. Prolonged hospitalization due to her renal failure not improving, nephrology remains on board.   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 -Cr has stabilized at around 5.1. -Etiology unclear. -Renal on board. Has been taken off Lasix, continue IV fluids. -started on bicarb tablets. -no indication for acute dialysis  Infected Prosthetic Knee Joint -Continue cefepime with planned stop date of 6/20. -Has follow up with ortho scheduled for 6/22.  HTN -Fair control. -Metoprolol dose has been increased to 50 mg once daily.   DVT prophylaxis: SQ heparin Code Status: full code Family Communication: patient only Disposition Plan: back to SNF once medically ready  Consultants:   Nephrology  Procedures:   None  Antimicrobials:  Anti-infectives    Start     Dose/Rate Route Frequency Ordered Stop   12/16/16 0800  ceFEPIme (MAXIPIME) 1 g in dextrose 5 % 50 mL IVPB     1 g 100 mL/hr over 30 Minutes Intravenous Every 24 hours 12/15/16 1808         Subjective: Lying in bed, no new issues, feels fatigued and weak  Objective: Vitals:   12/22/16 2015 12/22/16 2126 12/23/16 0522 12/23/16 1325  BP:  (!) 195/78 (!) 184/76 (!) 168/57  Pulse:  72 72 (!) 59  Resp:  18 18 18   Temp:  97.9 F (36.6 C) 98.4 F (36.9 C) 97.9 F (36.6 C)  TempSrc:  Oral Oral Oral  SpO2: 97% 96% 94% 96%  Weight:   100.5 kg (221 lb 9.6 oz)   Height:        Intake/Output Summary  (Last 24 hours) at 12/23/16 1457 Last data filed at 12/23/16 1200  Gross per 24 hour  Intake         12649.42 ml  Output              750 ml  Net         11899.42 ml   Filed Weights   12/21/16 0500 12/22/16 0552 12/23/16 0522  Weight: 102.4 kg (225 lb 12.8 oz) 101.6 kg (224 lb) 100.5 kg (221 lb 9.6 oz)    Examination:  General exam: Alert, awake, oriented x 3 Respiratory system: Clear to auscultation. Respiratory effort normal. Cardiovascular system:RRR. No murmurs, rubs, gallops. Gastrointestinal system: Abdomen is nondistended, soft and nontender. No organomegaly or masses felt. Normal bowel sounds heard. Central nervous system: Alert and oriented. No focal neurological deficits. Extremities: No C/C/E, +pedal pulses Skin: No rashes, lesions or ulcers Psychiatry: Judgement and insight appear normal. Mood & affect appropriate.           Data Reviewed: I have personally reviewed following labs and imaging studies  CBC:  Recent Labs Lab 12/17/16 0559 12/20/16 0522 12/21/16 0522  WBC 9.2 10.1 8.4  HGB 9.3* 8.3* 8.6*  HCT 28.9* 25.5* 26.6*  MCV 89.2 88.5 88.4  PLT 201 184 341   Basic Metabolic Panel:  Recent Labs Lab 12/19/16 0621 12/20/16 0522 12/21/16  0522 12/22/16 0506 12/23/16 0619  NA 138 139 141 142 142  K 5.0 4.7 4.2 4.0 3.9  CL 110 109 109 108 111  CO2 19* 20* 23 23 21*  GLUCOSE 107* 96 102* 97 103*  BUN 61* 66* 67* 63* 61*  CREATININE 4.61* 5.21* 5.13* 5.18* 5.18*  CALCIUM 8.3* 8.3* 8.1*  8.0* 8.3* 8.1*  PHOS 5.5* 5.8* 5.9* 6.2* 5.7*   GFR: Estimated Creatinine Clearance: 11.1 mL/min (A) (by C-G formula based on SCr of 5.18 mg/dL (H)). Liver Function Tests:  Recent Labs Lab 12/19/16 3474 12/20/16 0522 12/21/16 0522 12/22/16 0506 12/23/16 0619  ALBUMIN 2.6* 2.5* 2.5* 2.5* 2.5*   No results for input(s): LIPASE, AMYLASE in the last 168 hours. No results for input(s): AMMONIA in the last 168 hours. Coagulation Profile: No results for  input(s): INR, PROTIME in the last 168 hours. Cardiac Enzymes: No results for input(s): CKTOTAL, CKMB, CKMBINDEX, TROPONINI in the last 168 hours. BNP (last 3 results) No results for input(s): PROBNP in the last 8760 hours. HbA1C: No results for input(s): HGBA1C in the last 72 hours. CBG:  Recent Labs Lab 12/17/16 0729 12/17/16 1106 12/17/16 1641  GLUCAP 101* 103* 90   Lipid Profile: No results for input(s): CHOL, HDL, LDLCALC, TRIG, CHOLHDL, LDLDIRECT in the last 72 hours. Thyroid Function Tests: No results for input(s): TSH, T4TOTAL, FREET4, T3FREE, THYROIDAB in the last 72 hours. Anemia Panel:  Recent Labs  12/21/16 0522  FERRITIN 457*  TIBC 196*  IRON 37   Urine analysis:    Component Value Date/Time   COLORURINE YELLOW 12/15/2016 1247   APPEARANCEUR CLEAR 12/15/2016 1247   LABSPEC 1.011 12/15/2016 1247   PHURINE 5.0 12/15/2016 1247   GLUCOSEU NEGATIVE 12/15/2016 1247   HGBUR SMALL (A) 12/15/2016 1247   BILIRUBINUR NEGATIVE 12/15/2016 1247   KETONESUR NEGATIVE 12/15/2016 1247   PROTEINUR 30 (A) 12/15/2016 1247   UROBILINOGEN 0.2 08/25/2012 1408   NITRITE NEGATIVE 12/15/2016 1247   LEUKOCYTESUR NEGATIVE 12/15/2016 1247   Sepsis Labs: @LABRCNTIP (procalcitonin:4,lacticidven:4)  ) Recent Results (from the past 240 hour(s))  MRSA PCR Screening     Status: None   Collection Time: 12/15/16  6:54 PM  Result Value Ref Range Status   MRSA by PCR NEGATIVE NEGATIVE Final    Comment:        The GeneXpert MRSA Assay (FDA approved for NASAL specimens only), is one component of a comprehensive MRSA colonization surveillance program. It is not intended to diagnose MRSA infection nor to guide or monitor treatment for MRSA infections.          Radiology Studies: No results found.      Scheduled Meds: . atenolol  50 mg Oral Daily  . heparin  5,000 Units Subcutaneous Q8H  . sodium bicarbonate  650 mg Oral BID   Continuous Infusions: . sodium chloride  135 mL/hr at 12/23/16 0532  . ceFEPIme (MAXIPIME) 1 GM IVPB Stopped (12/23/16 0911)     LOS: 8 days    Time spent: 25 minutes. Greater than 50% of this time was spent in direct contact with the patient coordinating care.     Lelon Frohlich, MD [facility] Pager [phone]  If 7PM-7AM, please contact night-coverage [email or web address] Password [email or web address] 12/23/2016, 2:57 PM

## 2016-12-23 NOTE — Progress Notes (Signed)
Physical Therapy Treatment Patient Details Name: Laura Chambers MRN: [id number] DOB: April 28, 1941 Today's Date: 12/23/2016    History of Present Illness Laura Chambers is a 76yo white female who comes to [facility] from [facility] due to declining kidney function, admitted with AKI. Pt has been at [facility] on IV ABX since hardware removal from remote Left TKA and placement of ABX spacer. Has been receiving rehab at [facility] as well to address mobility deficits. She remains Lt TTWB, and revision surgery has not been scheduled at this point. Prior to sepsis in May, pt was a Corporate investment banker adult, still working, AMB withotu restriction or AD, living alone. PMH: bilat TKA (2001, 2010), Rt anterior THA Laura Chambers 2014), back surgery, anxiety, fibromyalgia, HTN, BLE neuropathy, Bilat foot drop.     PT Comments    Pt presents with improved level of arousal this session.  Pt continues to be limited by pain Rt knee, RN aware for medication assistance.  Session focus on strengthening with Rt LE only, no therex complete with Lt LE.  Pt able to demonstrate appropriate weight bearing restrictions, TTWB Lt LE.  Transfer complete to [facility] and attempted transfer to chair but pt. C/o lightheadnesses and asked to stay in bed.  EOS pt left in bed with Lt LE elevated with ice for pain control.  Timer set on pt.'s phone to assure doesn't stay on knee too long.  No reports of increased pain through session.   Follow Up Recommendations        Equipment Recommendations       Recommendations for Other Services       Precautions / Restrictions Precautions Precautions: Fall;Knee Required Braces or Orthoses:  (Lt knee brace OOB) Knee Immobilizer - Left: On when out of bed or walking Restrictions Weight Bearing Restrictions: Yes LLE Weight Bearing: Touchdown weight bearing    Mobility  Bed Mobility Overal bed mobility: Independent Bed Mobility: Supine to Sit     Supine to sit: Independent        Transfers Overall transfer  level: Modified independent Equipment used: Rolling walker (2 wheeled) Transfers: Sit to/from [facility] (STS for strengtheing, transfer to [facility]; attempted transfer to chair then c/o lightheaded so sat on EOB) Sit to Stand: Supervision (Cueing for appropriate weight bearing Lt LE)         General transfer comment: knows well safe techniques and weight bearing restricton with good adherance   Ambulation/Gait                 Stairs            Wheelchair Mobility    Modified Rankin (Stroke Patients Only)       Balance                                            Cognition Arousal/Alertness: Awake/alert (confused ) Behavior During Therapy: WFL for tasks assessed/performed Overall Cognitive Status: Within Functional Limits for tasks assessed Area of Impairment: Following commands                 Orientation Level: Disoriented to;Time Current Attention Level: Focused   Following Commands: Follows multi-step commands with increased time     Problem Solving: Slow processing General Comments: Pt stated she is confused initially and thorugh session, improved awareness and able to orientation person, place and situation; unable to tell length of stay  in [facility]      Exercises Total Joint Exercises Ankle Circles/Pumps: AROM;Right;15 reps;Supine (Rt LE only) Quad Sets: AROM;Right;5 reps;Supine Heel Slides: AROM;Right;10 reps Hip ABduction/ADduction: AROM;AAROM;Right;15 reps    General Comments        Pertinent Vitals/Pain Pain Score: (P) 5  Pain Location: (P) L knee    Home Living                      Prior Function            PT Goals (current goals can now be found in the care plan section)      Frequency           PT Plan      Co-evaluation              AM-PAC PT "6 Clicks" Daily Activity  Outcome Measure                   End of Session               Time:  -      Charges:                       G Codes:       Laura Chambers, LPTA; Laura Chambers 12/23/2016, 11:54 AM

## 2016-12-23 NOTE — Progress Notes (Addendum)
Subjective: Interval History:  Patient complains of feeling tired and she will like to walk around. She denies any difficulty breathing. She denies also any nausea or vomiting. Objective: Vital signs in last 24 hours: Temp:  [97.8 F (36.6 C)-98.4 F (36.9 C)] 98.4 F (36.9 C) (06/19 0522) Pulse Rate:  [67-72] 72 (06/19 0522) Resp:  [18] 18 (06/19 0522) BP: (178-195)/(74-78) 184/76 (06/19 0522) SpO2:  [94 %-97 %] 94 % (06/19 0522) Weight:  [100.5 kg (221 lb 9.6 oz)] 100.5 kg (221 lb 9.6 oz) (06/19 0522) Weight change: -1.089 kg (-2 lb 6.4 oz)  Intake/Output from previous day: 06/18 0701 - 06/19 0700 In: 12889.4 [P.O.:960; I.V.:11679.4; IV Piggyback:250] Out: 1600 [Urine:1600] Intake/Output this shift: No intake/output data recorded.  General appearance: alert, cooperative and no distress Resp: clear to auscultation bilaterally Cardio: regular rate and rhythm Extremities: edema She has trace to 1+ edema right greater than left  Lab Results:  Recent Labs  12/21/16 0522  WBC 8.4  HGB 8.6*  HCT 26.6*  PLT 191   BMET:   Recent Labs  12/22/16 0506 12/23/16 0619  NA 142 142  K 4.0 3.9  CL 108 111  CO2 23 21*  GLUCOSE 97 103*  BUN 63* 61*  CREATININE 5.18* 5.18*  CALCIUM 8.3* 8.1*    Recent Labs  12/21/16 0522  PTH 86*  Comment   Iron Studies:   Recent Labs  12/21/16 0522  IRON 37  TIBC 196*  FERRITIN 457*    Studies/Results: No results found.  I have reviewed the patient's current medications.  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 Patient had 1600 mL of urine output. Presently she is not on diuretics. Patient at this moment denies any nausea or vomiting. Her appetite is good. Problem #2 hypertension: Her blood pressure is reasonably controlled Problem #3 anemia: Her hemoglobin is low  but stable. Problem #4 hypertension: Her blood pressure is Somewhat high. Patient is on atenolol 25 mg once a day Problem #5 hyperkalemia: Her potassium is normal. Problem #6 history of infected prosthesis: Patient on antibiotics. She is afebrile and her white blood cell count is normal. Problem #7 bone and mineral  disorder: Her calcium is in range but her phosphorus is  high.  Problem #8 low CO2 possibly metabolic. Patient is started on sodium bicarbonate. Her CO2 is 21 Plan: 1] we'll continue his hydration 2] we will check her renal panel in the morning 3] we'll increase atenolol to 50 mg once a day.   LOS: 8 days   Naraly Fritcher S 12/23/2016,7:38 AM

## 2016-12-24 DIAGNOSIS — G9341 Metabolic encephalopathy: Secondary | ICD-10-CM | POA: Diagnosis present

## 2016-12-24 DIAGNOSIS — T889XXS Complication of surgical and medical care, unspecified, sequela: Secondary | ICD-10-CM

## 2016-12-24 LAB — RENAL FUNCTION PANEL
ALBUMIN: 2.6 g/dL — AB (ref 3.5–5.0)
Anion gap: 11 (ref 5–15)
BUN: 58 mg/dL — AB (ref 6–20)
CALCIUM: 8.2 mg/dL — AB (ref 8.9–10.3)
CHLORIDE: 111 mmol/L (ref 101–111)
CO2: 21 mmol/L — ABNORMAL LOW (ref 22–32)
CREATININE: 5.48 mg/dL — AB (ref 0.44–1.00)
GFR, EST AFRICAN AMERICAN: 8 mL/min — AB (ref >60)
GFR, EST NON AFRICAN AMERICAN: 7 mL/min — AB (ref >60)
Glucose, Bld: 117 mg/dL — ABNORMAL HIGH (ref 65–99)
PHOSPHORUS: 5.2 mg/dL — AB (ref 2.5–4.6)
Potassium: 3.9 mmol/L (ref 3.5–5.1)
Sodium: 143 mmol/L (ref 135–145)

## 2016-12-24 LAB — VITAMIN B12: VITAMIN B 12: 299 pg/mL (ref 180–914)

## 2016-12-24 LAB — TSH: TSH: 2.329 u[IU]/mL (ref 0.350–4.500)

## 2016-12-24 LAB — T4, FREE: Free T4: 0.77 ng/dL (ref 0.61–1.12)

## 2016-12-24 LAB — AMMONIA: AMMONIA: 20 umol/L (ref 9–35)

## 2016-12-24 LAB — CK: Total CK: 35 U/L — ABNORMAL LOW (ref 38–234)

## 2016-12-24 MED ORDER — HALOPERIDOL LACTATE 5 MG/ML IJ SOLN
5.0000 mg | Freq: Four times a day (QID) | INTRAMUSCULAR | Status: DC | PRN
Start: 2016-12-24 — End: 2016-12-26
  Administered 2016-12-24 – 2016-12-25 (×3): 5 mg via INTRAVENOUS
  Filled 2016-12-24 (×3): qty 1

## 2016-12-24 MED ORDER — FUROSEMIDE 10 MG/ML IJ SOLN
40.0000 mg | Freq: Two times a day (BID) | INTRAMUSCULAR | Status: DC
  Administered 2016-12-24 – 2016-12-31 (×15): 40 mg via INTRAVENOUS
  Filled 2016-12-24 (×15): qty 4

## 2016-12-24 MED ORDER — HALOPERIDOL LACTATE 5 MG/ML IJ SOLN
5.0000 mg | Freq: Once | INTRAMUSCULAR | Status: AC
  Administered 2016-12-24: 5 mg via INTRAVENOUS
  Filled 2016-12-24: qty 1

## 2016-12-24 MED ORDER — HALOPERIDOL LACTATE 5 MG/ML IJ SOLN
2.0000 mg | Freq: Four times a day (QID) | INTRAMUSCULAR | Status: DC | PRN
  Administered 2016-12-24: 2 mg via INTRAVENOUS
  Filled 2016-12-24: qty 1

## 2016-12-24 MED ORDER — PROMETHAZINE HCL 25 MG/ML IJ SOLN
12.5000 mg | Freq: Four times a day (QID) | INTRAMUSCULAR | Status: DC | PRN
  Administered 2016-12-24 – 2016-12-30 (×6): 12.5 mg via INTRAVENOUS
  Filled 2016-12-24 (×6): qty 1

## 2016-12-24 NOTE — Progress Notes (Signed)
Subjective: Interval History:  Patient feels much better today. She denies any difficulty breathing. Objective: Vital signs in last 24 hours: Temp:  [97.9 F (36.6 C)-98 F (36.7 C)] 97.9 F (36.6 C) (06/20 0500) Pulse Rate:  [59-78] 78 (06/20 0500) Resp:  [18-20] 20 (06/20 0500) BP: (168-186)/(57-82) 172/78 (06/20 0500) SpO2:  [92 %-96 %] 96 % (06/20 0500) Weight:  [101 kg (222 lb 10.6 oz)] 101 kg (222 lb 10.6 oz) (06/20 0500) Weight change: 0.483 kg (1 lb 1 oz)  Intake/Output from previous day: 06/19 0701 - 06/20 0700 In: 360 [P.O.:360] Out: 1751 [Urine:1750; Stool:1] Intake/Output this shift: No intake/output data recorded.  General appearance: alert, cooperative and no distress Resp: clear to auscultation bilaterally Cardio: regular rate and rhythm Extremities: edema She has trace to 1+ edema right greater than left  Lab Results: No results for input(s): WBC, HGB, HCT, PLT in the last 72 hours. BMET:   Recent Labs  12/22/16 0506 12/23/16 0619  NA 142 142  K 4.0 3.9  CL 108 111  CO2 23 21*  GLUCOSE 97 103*  BUN 63* 61*  CREATININE 5.18* 5.18*  CALCIUM 8.3* 8.1*   No results for input(s): PTH in the last 72 hours. Iron Studies:  No results for input(s): IRON, TIBC, TRANSFERRIN, FERRITIN in the last 72 hours.  Studies/Results: No results found.  I have reviewed the patient's current medications.  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 Patient had 1700 mL of urine output. Presently she is not on diuretics. Patient at this moment denies any nausea or vomiting. Her appetite is good. Problem #2 anemia: Her hemoglobin is low but stable. Problem #3 hypertension: Her blood pressure is Somewhat high. Atenolol has been increased to 50 mg once a day. Problem #4  hyperkalemia: Her potassium is normal. Problem #5 history of infected prosthesis: Patient on antibiotics. She is afebrile and her white blood cell count is normal. Problem #6 bone and mineral  disorder: Her calcium is in range but her phosphorus is  high.  Problem #7 low CO2 possibly metabolic. Patient is started on sodium bicarbonate. Her CO2 is 21 Plan: 1] we'll continue his hydration 2] we will check her renal panel in the morning 3] we'll start patient on lasix 40 mg iv bid.   LOS: 9 days   Ryn Peine S 12/24/2016,7:44 AM

## 2016-12-24 NOTE — Progress Notes (Signed)
Initial Nutrition Assessment  DOCUMENTATION CODES:  Obesity unspecified  INTERVENTION:  Magic cup BID with meals, each supplement provides 290 kcal and 9 grams of protein   Will monitor PO intake and follow up as warranted  NUTRITION DIAGNOSIS:  Inadequate oral intake related to nausea, poor appetite, acute illness (AKI) as evidenced by meal completion < 50% x 7 days  GOAL:  Patient will meet greater than or equal to 90% of their needs  MONITOR:  PO intake, Supplement acceptance, Labs, Weight trends, I & O's  REASON FOR ASSESSMENT:  LOS    ASSESSMENT:  76 y/o woman PMHx HTN, HLD, Anxiety, L TKA (2001) which recently became infected s/p I/D & placement of abx spacer 5/11. Had been on IV abx at [facility] affiliated SNF for ~ 1 month. Sent to ED due to increasing Creatinine/AKI.    Pt being seen due to prolonged LOS and poor/suboptimal PO intake. Pt's creatinine has continued to increase.  Per meal intake review, her intake has been highly variable. She has not eaten >50% meals x1 week. She ate half of all her meals on 6/18, but since then she has not eaten >25% of a meal.   Pt today seen in bed, slightly tearful. Responses to questions are only a few words. She states she has no desire for food and feels very poor. She is very nauseated, but unable to vomit. Nurse had gave zofran with some relief. Her family at bedside report that the patient has eaten very poorly since she has been admitted. They state dietary called to take an order for dinner but patient said she didn't care/want anything  RD went over importance of nutrition, especially since she has not eaten well since admitted. Offered comfort foods. She stated she would try a couple items, but felt she would not tolerate them.   Given high phos, RD asked MD regarding comfort foods such as icecream.   Will monitor intake and follow up as needed. Predict patient will continue to have poor intake with ongoing/worsening AKI.   Pt  was 221 lbs when left SNF, Weights have been 219-229 at hospital.   NFPE: Deferred  Labs: Creat up to 5.48, K WDL. Phos elevated but trending down. Albumin: 2.6, BG 90-120 Meds: Hydrocodone, ativan, zofran/phenerfan, Lasix, Bicarb, IVF. IV ABx  Diet Order:  Diet regular Room service appropriate? Yes; Fluid consistency: Thin  Skin:  Reviewed, no issues  Last BM:  6/18  Height:  Ht Readings from Last 1 Encounters:  12/15/16 5\' 6"  (1.676 m)   Weight:  Wt Readings from Last 1 Encounters:  12/24/16 222 lb 10.6 oz (101 kg)   Wt Readings from Last 10 Encounters:  12/24/16 222 lb 10.6 oz (101 kg)  11/12/16 189 lb (85.7 kg)  04/01/16 214 lb (97.1 kg)  08/23/15 217 lb 6.4 oz (98.6 kg)  08/15/15 217 lb 12.8 oz (98.8 kg)  04/20/15 219 lb 9.6 oz (99.6 kg)  12/05/14 210 lb 12.8 oz (95.6 kg)  01/25/14 216 lb (98 kg)  01/12/14 209 lb (94.8 kg)  01/11/14 212 lb 6.4 oz (96.3 kg)   Ideal Body Weight:  59.1 kg  BMI:  Body mass index is 35.94 kg/m.  Estimated Nutritional Needs:  Kcal:  1700-1900 kcals (17-19 kcal/kg bw) Protein:  71-83 g Pro (1.2-1.4 g/kg bw) Fluid:  Per MD  EDUCATION NEEDS:  No education needs identified at this time  Burtis Junes RD, LDN, [place] Clinical Nutrition Pager: [phone] 12/24/2016 5:38 PM

## 2016-12-24 NOTE — Clinical Social Work Note (Signed)
LCSW provided status update to [place] at [facility].           Lothar Prehn, Clydene Pugh, LCSW

## 2016-12-24 NOTE — Progress Notes (Signed)
PROGRESS NOTE  Laura Chambers QDI:[id number] DOB: 1941/01/24 DOA: 12/15/2016 PCP: Lelon Perla, MD  Brief History:  76 year old female with a history of essential hypertension, hyperlipidemia, renal angiomyolipoma , and fibromyalgia presented from the [facility] due to abnormal labs showing AKI. The patient had a left total knee arthroplasty in 2001. The patient had been suffering with worsening left knee pain for at least 6 months.  Dr. Jean Rosenthal performed outpatient arthrocentesis on 11/10/2016. The cultures were negative, but cell count showed 28,750 WBC.  Subsequent, the patient was admitted to the hospital from 11/11/2016 through 11/19/2016. During that hospitalization, the patient had excision of her left total knee arthroplasty with placement of antibiotic spacer on 11/14/2016. The patient was initially treated with vancomycin and Zosyn, but this was switched to cefepime on 11/17/2016 secondary to increasing serum creatinine. Intraoperative cultures were negative for growth. The patient was discharged to a skilled nursing facility to finish 6 weeks of antibiotics with the anticipated stop date on 12/26/2016. Routine blood work obtained on 12/15/2016 showed that the patient's serum creatinine had increased to 4.76. As result, the patient was admitted to the hospital for further workup. Nephrology was consulted to assist with management. Postoperatively the patient did require placement of a Foley catheter secondary to urinary retention. The urinary catheter remained in place for 3 weeks and was removed one week prior to this admission.   Assessment/Plan: AKI -The patient had baseline serum creatinine 0.8-1.1 during her May hospital admission  -Renal ultrasound negative for hydronephrosis  -appreciate nephrology follow up -IVF per nephrology -agree with IV lasix--pt appears volume overloaded -?AIN from antibiotics -urine eosinophils  Acute metabolic  encephalopathy -multifactorial with AKI the major contributor  -other contributors from benzodiazepines, and opioids (longer half-life with AKI) -serum B12 -TSH -ammonia  Prosthetic joint infection -last day of antibiotics on 12/24/16 which will be 42 days from date of exicision  Essential hypertension  -atenolol increased to 50 mg daily on 6/19  Hyperlipidemia -restart rovustatin if CK WNL  Metabolic acidosis -due to AKI -bicarbonate per nephrology    Disposition Plan:   Home when cleared by nephrology Family Communication:   Son updated on phone 6/20  Consultants:  nephrology  Code Status:  FULL  DVT Prophylaxis:  [place] Heparin   Procedures: As Listed in Progress Note Above  Antibiotics: Cefepime 5/14>>>6/21    Subjective: Patient is presently confused. She complains of some left knee pain with weightbearing. She has some nausea without emesis. Appetite is poor. She denies any headache, chest pain, shortness breath, vomiting, diarrhea, dysuria, hematuria. Denies any fevers chills, but she feels cold.  Objective: Vitals:   12/23/16 1325 12/23/16 2005 12/23/16 2220 12/24/16 0500  BP: (!) 168/57  (!) 186/82 (!) 172/78  Pulse: (!) 59  75 78  Resp: 18  20 20   Temp: 97.9 F (36.6 C)  98 F (36.7 C) 97.9 F (36.6 C)  TempSrc: Oral  Oral Oral  SpO2: 96% 92% 95% 96%  Weight:    101 kg (222 lb 10.6 oz)  Height:        Intake/Output Summary (Last 24 hours) at 12/24/16 1152 Last data filed at 12/24/16 0900  Gross per 24 hour  Intake              480 ml  Output             1951 ml  Net            -  1471 ml   Weight change: 0.483 kg (1 lb 1 oz) Exam:   General:  Pt is alert, follows commands appropriately, not in acute distress  HEENT: No icterus, No thrush, No neck mass, [place]/AT  Cardiovascular: RRR, S1/S2, no rubs, no gallops  Respiratory: CTA bilaterally, no wheezing, no crackles, no rhonchi  Abdomen: Soft/+BS, non tender, non distended, no  guarding  Extremities:  1+ LE edema, No lymphangitis, No petechiae, No rashes, no synovitis   Data Reviewed: I have personally reviewed following labs and imaging studies Basic Metabolic Panel:  Recent Labs Lab 12/20/16 0522 12/21/16 0522 12/22/16 0506 12/23/16 0619 12/24/16 0745  NA 139 141 142 142 143  K 4.7 4.2 4.0 3.9 3.9  CL 109 109 108 111 111  CO2 20* 23 23 21* 21*  GLUCOSE 96 102* 97 103* 117*  BUN 66* 67* 63* 61* 58*  CREATININE 5.21* 5.13* 5.18* 5.18* 5.48*  CALCIUM 8.3* 8.1*  8.0* 8.3* 8.1* 8.2*  PHOS 5.8* 5.9* 6.2* 5.7* 5.2*   Liver Function Tests:  Recent Labs Lab 12/20/16 0522 12/21/16 0522 12/22/16 0506 12/23/16 0619 12/24/16 0745  ALBUMIN 2.5* 2.5* 2.5* 2.5* 2.6*   No results for input(s): LIPASE, AMYLASE in the last 168 hours. No results for input(s): AMMONIA in the last 168 hours. Coagulation Profile: No results for input(s): INR, PROTIME in the last 168 hours. CBC:  Recent Labs Lab 12/20/16 0522 12/21/16 0522  WBC 10.1 8.4  HGB 8.3* 8.6*  HCT 25.5* 26.6*  MCV 88.5 88.4  PLT 184 191   Cardiac Enzymes: No results for input(s): CKTOTAL, CKMB, CKMBINDEX, TROPONINI in the last 168 hours. BNP: Invalid input(s): POCBNP CBG:  Recent Labs Lab 12/17/16 1641  GLUCAP 90   HbA1C: No results for input(s): HGBA1C in the last 72 hours. Urine analysis:    Component Value Date/Time   COLORURINE YELLOW 12/15/2016 1247   APPEARANCEUR CLEAR 12/15/2016 1247   LABSPEC 1.011 12/15/2016 1247   PHURINE 5.0 12/15/2016 1247   GLUCOSEU NEGATIVE 12/15/2016 1247   HGBUR SMALL (A) 12/15/2016 1247   BILIRUBINUR NEGATIVE 12/15/2016 1247   KETONESUR NEGATIVE 12/15/2016 1247   PROTEINUR 30 (A) 12/15/2016 1247   UROBILINOGEN 0.2 08/25/2012 1408   NITRITE NEGATIVE 12/15/2016 1247   LEUKOCYTESUR NEGATIVE 12/15/2016 1247   Sepsis Labs: @LABRCNTIP (procalcitonin:4,lacticidven:4) ) Recent Results (from the past 240 hour(s))  MRSA PCR Screening      Status: None   Collection Time: 12/15/16  6:54 PM  Result Value Ref Range Status   MRSA by PCR NEGATIVE NEGATIVE Final    Comment:        The GeneXpert MRSA Assay (FDA approved for NASAL specimens only), is one component of a comprehensive MRSA colonization surveillance program. It is not intended to diagnose MRSA infection nor to guide or monitor treatment for MRSA infections.      Scheduled Meds: . atenolol  50 mg Oral Daily  . heparin  5,000 Units Subcutaneous Q8H  . sodium bicarbonate  650 mg Oral BID   Continuous Infusions: . sodium chloride 135 mL/hr at 12/23/16 2306    Procedures/Studies: US Renal  Result Date: 12/15/2016 CLINICAL DATA:  Renal failure EXAM: RENAL / URINARY TRACT ULTRASOUND COMPLETE COMPARISON:  None. FINDINGS: Right Kidney: Length: 11.2 cm. 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 No hydronephrosis. Left Kidney: Length: 11.6 cm.  No mass or hydronephrosis. Bladder: Underdistended. 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 No hydronephrosis. Electronically Signed   By: Julian Hy  M.D.   On: 12/15/2016 14:10    Maya Arcand, DO  [facility] Pager [phone]  If 7PM-7AM, please contact night-coverage [email or web address] Password [email or web address] 12/24/2016, 11:52 AM   LOS: 9 days

## 2016-12-25 ENCOUNTER — Encounter (HOSPITAL_COMMUNITY): Payer: Self-pay

## 2016-12-25 ENCOUNTER — Ambulatory Visit (HOSPITAL_COMMUNITY)
Admit: 2016-12-25 | Discharge: 2016-12-25 | Disposition: A | Discharge status: Home / Self Care | Payer: PPO | Attending: Internal Medicine | Admitting: Internal Medicine

## 2016-12-25 LAB — RENAL FUNCTION PANEL
Albumin: 2.9 g/dL — ABNORMAL LOW (ref 3.5–5.0)
Anion gap: 14 (ref 5–15)
BUN: 59 mg/dL — AB (ref 6–20)
CHLORIDE: 111 mmol/L (ref 101–111)
CO2: 18 mmol/L — ABNORMAL LOW (ref 22–32)
CREATININE: 5.26 mg/dL — AB (ref 0.44–1.00)
Calcium: 8.4 mg/dL — ABNORMAL LOW (ref 8.9–10.3)
GFR calc Af Amer: 8 mL/min — ABNORMAL LOW (ref >60)
GFR, EST NON AFRICAN AMERICAN: 7 mL/min — AB (ref >60)
GLUCOSE: 132 mg/dL — AB (ref 65–99)
POTASSIUM: 3.7 mmol/L (ref 3.5–5.1)
Phosphorus: 5.3 mg/dL — ABNORMAL HIGH (ref 2.5–4.6)
Sodium: 143 mmol/L (ref 135–145)

## 2016-12-25 MED ORDER — FUROSEMIDE 10 MG/ML IJ SOLN
80.0000 mg | Freq: Once | INTRAMUSCULAR | Status: AC
  Administered 2016-12-25: 80 mg via INTRAVENOUS
  Filled 2016-12-25: qty 8

## 2016-12-25 MED ORDER — VITAMIN B-12 1000 MCG PO TABS
500.0000 ug | ORAL_TABLET | Freq: Every day | ORAL | Status: DC
  Administered 2016-12-25 – 2017-01-01 (×8): 500 ug via ORAL
  Filled 2016-12-25 (×8): qty 1

## 2016-12-25 NOTE — Care Management Note (Signed)
Case Management Note  Patient Details  Name: Laura Chambers MRN: [id number] Date of Birth: 1940-10-26  If discussed at [place] Length of Stay Meetings, dates discussed:  12/25/2016   Sherald Barge, RN 12/25/2016, 1:34 PM

## 2016-12-25 NOTE — Progress Notes (Signed)
Subjective: Interval History:  Patient complains of being nervous and agitated. She states that she doesn't know what is going on. She denies any difficulty breathing and no chest pain. She doesn't offer any complaints except stating that she is anxious. Objective: Vital signs in last 24 hours: Temp:  [97.9 F (36.6 C)-98.6 F (37 C)] 98.6 F (37 C) (06/21 0500) Pulse Rate:  [71-84] 82 (06/21 0500) Resp:  [20-22] 20 (06/21 0500) BP: (149-192)/(60-70) 174/68 (06/21 0500) SpO2:  [88 %-96 %] 96 % (06/21 0500) Weight change:   Intake/Output from previous day: 06/20 0701 - 06/21 0700 In: 5268.8 [P.O.:240; I.V.:5028.8] Out: 2650 [Urine:2650] Intake/Output this shift: No intake/output data recorded.  Generally patient is confused, agitated.  Chest: Decreased breath sound otherwise seems to be clear Heart exam revealed regular rate and rhythm no murmur Extremities: She has 1+ edema bilaterally  Lab Results: No results for input(s): WBC, HGB, HCT, PLT in the last 72 hours. BMET:   Recent Labs  12/24/16 0745 12/25/16 0527  NA 143 143  K 3.9 3.7  CL 111 111  CO2 21* 18*  GLUCOSE 117* 132*  BUN 58* 59*  CREATININE 5.48* 5.26*  CALCIUM 8.2* 8.4*   No results for input(s): PTH in the last 72 hours. Iron Studies:  No results for input(s): IRON, TIBC, TRANSFERRIN, FERRITIN in the last 72 hours.  Studies/Results: No results found.  I have reviewed the patient's current medications.  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 Her creatinine is 5.26 seems to start improving. She had 2600 mL of urine output and presently on Lasix. Problem #2 anemia: Her hemoglobin is low but stable. Possibly secondary to chronic disease. Problem #3 hypertension: Her blood pressure is Somewhat high. Atenolol has been increased to 50 mg once a day. Problem #4  hyperkalemia: Her potassium is normal. Problem #5 history of infected prosthesis: Patient on antibiotics. She is afebrile and her white blood cell count is normal. Problem #6 bone and mineral  disorder: Her calcium is in range but her phosphorus is  high.  Problem #7 low CO2 possibly metabolic. Patient is started on sodium bicarbonate. Her CO2 is 21 Problem #8 history of contusion and agitation. Patient with history of anxiety disorder. According to her son she has similar but less type of agitation when she was a nursing home and patient was put on Xanax with good result. Presently patient is on Haldol after Ativan was discontinued because of it side effects. Plan: 1] at this moment patient may benefit from trial of Xanax 2] we will check her renal panel in the morning 3] we'll continue with Lasix. Have discussed with the patient and also her son if her renal function started getting worse we may need to consider dialysis. At this moment however her renal function seems to be stabilizing with some improvement and with good urine output.    LOS: 10 days   Denisha Hoel S 12/25/2016,8:01 AM

## 2016-12-25 NOTE — Progress Notes (Signed)
PT Cancellation Note  Patient Details Name: NANNA ERTLE MRN: [id number] DOB: 1940-07-14   Cancelled Treatment:    Reason Eval/Treat Not Completed: Patient declined, no reason specified Attempted PT session, pt refused to participate no reason given.  Pt left in bed with call bell within reach.  [address], LPTA; [place]   Aldona Lento 12/25/2016, 6:16 PM

## 2016-12-25 NOTE — Progress Notes (Signed)
PROGRESS NOTE  Laura Chambers ZMO:[id number] DOB: 1941/02/08 DOA: 12/15/2016 PCP: Lelon Perla, MD  Brief History:  76 year old female with a history of essential hypertension, hyperlipidemia, renal angiomyolipoma , and fibromyalgia presented from the [facility] due to abnormal labs showing AKI. The patient had a left total knee arthroplasty in 2001. The patient had been suffering with worsening left knee pain for at least 6 months.  Dr. Jean Rosenthal performed outpatient arthrocentesis on 11/10/2016. The cultures were negative, but cell count showed 28,750 WBC.  Subsequent, the patient was admitted to the hospital from 11/11/2016 through 11/19/2016. During that hospitalization, the patient had excision of her left total knee arthroplasty with placement of antibiotic spacer on 11/14/2016. The patient was initially treated with vancomycin and Zosyn, but this was switched to cefepime on 11/17/2016 secondary to increasing serum creatinine. Intraoperative cultures were negative for growth. The patient was discharged to a skilled nursing facility to finish 6 weeks of antibiotics with the anticipated stop date on 12/26/2016. Routine blood work obtained on 12/15/2016 showed that the patient's serum creatinine had increased to 4.76. As result, the patient was admitted to the hospital for further workup. Nephrology was consulted to assist with management. Postoperatively the patient did require placement of a Foley catheter secondary to urinary retention. The urinary catheter remained in place for 3 weeks and was removed one week prior to this admission. However, due to the patient's AKI foley catheter was placed during this admssion  Assessment/Plan: AKI--nonoliguric -The patient had baseline serum creatinine 0.8-1.1 during her May hospital admission  -Renal ultrasound negative for hydronephrosis  -appreciate nephrology follow up -agree with IV lasix--pt appears volume  overloaded -?AIN from antibiotics -urine eosinophils -6/21--case discussed with renal, Dr. Lowanda Foster -long discussion with son--monitor renal function another 24-38 hours--if no improvement, he requests transfer to [facility] for renal biopsy  Acute metabolic encephalopathy -multifactorial with AKI the major contributor  -other contributors from low B12, benzodiazepines, and opioids (longer half-life with AKI) -serum B12--299-->supplement -TSH--2.329 -ammonia--20 -Haldol 5 g iv every 6 hours when necessary agitation  Prosthetic joint infection -last day of antibiotics was 12/24/16 which was 42 days from date of exicision  Essential hypertension  -atenolol increased to 50 mg daily on 6/19 -add amlodipine  Hyperlipidemia -restart rovustatin if CK KPT--46  Metabolic acidosis -due to AKI -bicarbonate per nephrology    Disposition Plan:   Home when cleared by nephrology vs [facility]  Family Communication:   Son updated at bedside 6/21---Total time spent 35 minutes.  Greater than 50% spent face to face counseling and coordinating care.   Consultants:  nephrology  Code Status:  FULL  DVT Prophylaxis:  [place] Heparin   Procedures: As Listed in Progress Note Above  Antibiotics: Cefepime 5/14>>>6/21    Subjective: Total time spent 35 minutes.  Greater than 50% spent face to face counseling and coordinating care. The patient is pleasant and confused and intermittently agitated.   Objective: Vitals:   12/24/16 2047 12/24/16 2114 12/25/16 0500 12/25/16 1300  BP:  (!) 181/70 (!) 174/68 (!) 196/90  Pulse:  84 82 76  Resp:  20 20 20   Temp:  98.3 F (36.8 C) 98.6 F (37 C) 98.1 F (36.7 C)  TempSrc:  Oral Oral Oral  SpO2: (!) 88% 95% 96% 96%  Weight:      Height:        Intake/Output Summary (Last 24 hours) at 12/25/16 1627 Last data filed  at 12/25/16 1045  Gross per 24 hour  Intake          5148.75 ml  Output             3650 ml  Net          1498.75  ml   Weight change:  Exam:   General:  Pt is alert, follows commands appropriately, not in acute distress  HEENT: No icterus, No thrush, No neck mass, [place]/AT  Cardiovascular: RRR, S1/S2, no rubs, no gallops  Respiratory: Bibasilar crackles. No wheezing. Good air movement  Abdomen: Soft/+BS, non tender, non distended, no guarding  Extremities: 2 + LE edema, No lymphangitis, No petechiae, No rashes, no synovitis   Data Reviewed: I have personally reviewed following labs and imaging studies Basic Metabolic Panel:  Recent Labs Lab 12/21/16 0522 12/22/16 0506 12/23/16 0619 12/24/16 0745 12/25/16 0527  NA 141 142 142 143 143  K 4.2 4.0 3.9 3.9 3.7  CL 109 108 111 111 111  CO2 23 23 21* 21* 18*  GLUCOSE 102* 97 103* 117* 132*  BUN 67* 63* 61* 58* 59*  CREATININE 5.13* 5.18* 5.18* 5.48* 5.26*  CALCIUM 8.1*  8.0* 8.3* 8.1* 8.2* 8.4*  PHOS 5.9* 6.2* 5.7* 5.2* 5.3*   Liver Function Tests:  Recent Labs Lab 12/21/16 0522 12/22/16 0506 12/23/16 0619 12/24/16 0745 12/25/16 0527  ALBUMIN 2.5* 2.5* 2.5* 2.6* 2.9*   No results for input(s): LIPASE, AMYLASE in the last 168 hours.  Recent Labs Lab 12/24/16 1353  AMMONIA 20   Coagulation Profile: No results for input(s): INR, PROTIME in the last 168 hours. CBC:  Recent Labs Lab 12/20/16 0522 12/21/16 0522  WBC 10.1 8.4  HGB 8.3* 8.6*  HCT 25.5* 26.6*  MCV 88.5 88.4  PLT 184 191   Cardiac Enzymes:  Recent Labs Lab 12/24/16 1353  CKTOTAL 35*   BNP: Invalid input(s): POCBNP CBG: No results for input(s): GLUCAP in the last 168 hours. HbA1C: No results for input(s): HGBA1C in the last 72 hours. Urine analysis:    Component Value Date/Time   COLORURINE YELLOW 12/15/2016 1247   APPEARANCEUR CLEAR 12/15/2016 1247   LABSPEC 1.011 12/15/2016 1247   PHURINE 5.0 12/15/2016 1247   GLUCOSEU NEGATIVE 12/15/2016 1247   HGBUR SMALL (A) 12/15/2016 1247   BILIRUBINUR NEGATIVE 12/15/2016 1247   KETONESUR NEGATIVE  12/15/2016 1247   PROTEINUR 30 (A) 12/15/2016 1247   UROBILINOGEN 0.2 08/25/2012 1408   NITRITE NEGATIVE 12/15/2016 1247   LEUKOCYTESUR NEGATIVE 12/15/2016 1247   Sepsis Labs: @LABRCNTIP (procalcitonin:4,lacticidven:4) ) Recent Results (from the past 240 hour(s))  MRSA PCR Screening     Status: None   Collection Time: 12/15/16  6:54 PM  Result Value Ref Range Status   MRSA by PCR NEGATIVE NEGATIVE Final    Comment:        The GeneXpert MRSA Assay (FDA approved for NASAL specimens only), is one component of a comprehensive MRSA colonization surveillance program. It is not intended to diagnose MRSA infection nor to guide or monitor treatment for MRSA infections.      Scheduled Meds: . atenolol  50 mg Oral Daily  . furosemide  40 mg Intravenous BID  . heparin  5,000 Units Subcutaneous Q8H  . sodium bicarbonate  650 mg Oral BID   Continuous Infusions:  Procedures/Studies: US Renal  Result Date: 12/15/2016 CLINICAL DATA:  Renal failure EXAM: RENAL / URINARY TRACT ULTRASOUND COMPLETE COMPARISON:  None. FINDINGS: Right Kidney: Length: 11.2 cm. 16 x 11  x 12 mm right lower pole renal angiomyolipoma. No hydronephrosis. Left Kidney: Length: 11.6 cm.  No mass or hydronephrosis. Bladder: Underdistended. 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 No hydronephrosis. Electronically Signed   By: Julian Hy M.D.   On: 12/15/2016 14:10    Evans Levee, DO  [facility] Pager [phone]  If 7PM-7AM, please contact night-coverage [email or web address] Password [email or web address] 12/25/2016, 4:27 PM   LOS: 10 days

## 2016-12-26 ENCOUNTER — Inpatient Hospital Stay (HOSPITAL_COMMUNITY): Payer: PPO

## 2016-12-26 LAB — URINALYSIS, ROUTINE W REFLEX MICROSCOPIC
Bilirubin Urine: NEGATIVE
Glucose, UA: NEGATIVE mg/dL
KETONES UR: NEGATIVE mg/dL
Nitrite: NEGATIVE
PROTEIN: 30 mg/dL — AB
Specific Gravity, Urine: 1.006 (ref 1.005–1.030)
Squamous Epithelial / LPF: NONE SEEN
pH: 5 (ref 5.0–8.0)

## 2016-12-26 LAB — RENAL FUNCTION PANEL
ANION GAP: 12 (ref 5–15)
Albumin: 2.8 g/dL — ABNORMAL LOW (ref 3.5–5.0)
BUN: 62 mg/dL — AB (ref 6–20)
CO2: 24 mmol/L (ref 22–32)
Calcium: 8.3 mg/dL — ABNORMAL LOW (ref 8.9–10.3)
Chloride: 108 mmol/L (ref 101–111)
Creatinine, Ser: 5.65 mg/dL — ABNORMAL HIGH (ref 0.44–1.00)
GFR calc Af Amer: 8 mL/min — ABNORMAL LOW (ref >60)
GFR calc non Af Amer: 7 mL/min — ABNORMAL LOW (ref >60)
GLUCOSE: 118 mg/dL — AB (ref 65–99)
Phosphorus: 5.8 mg/dL — ABNORMAL HIGH (ref 2.5–4.6)
Potassium: 3.4 mmol/L — ABNORMAL LOW (ref 3.5–5.1)
SODIUM: 144 mmol/L (ref 135–145)

## 2016-12-26 LAB — BLOOD GAS, ARTERIAL
Acid-Base Excess: 0.8 mmol/L (ref 0.0–2.0)
BICARBONATE: 25.2 mmol/L (ref 20.0–28.0)
DRAWN BY: 234301
O2 Content: 2 L/min
O2 Saturation: 96.7 %
PCO2 ART: 38.1 mmHg (ref 32.0–48.0)
PH ART: 7.427 (ref 7.350–7.450)
Patient temperature: 37
pO2, Arterial: 89 mmHg (ref 83.0–108.0)

## 2016-12-26 LAB — AMMONIA: AMMONIA: 19 umol/L (ref 9–35)

## 2016-12-26 MED ORDER — POTASSIUM CHLORIDE IN NACL 20-0.45 MEQ/L-% IV SOLN
INTRAVENOUS | Status: DC
  Administered 2016-12-26 – 2016-12-31 (×11): via INTRAVENOUS
  Filled 2016-12-26 (×18): qty 1000

## 2016-12-26 MED ORDER — HALOPERIDOL LACTATE 5 MG/ML IJ SOLN: 1.0000 mg | Freq: Four times a day (QID) | INTRAMUSCULAR | Status: DC | PRN

## 2016-12-26 MED ORDER — GABAPENTIN 100 MG PO CAPS
200.0000 mg | ORAL_CAPSULE | Freq: Two times a day (BID) | ORAL | Status: DC
  Administered 2016-12-26: 200 mg via ORAL
  Filled 2016-12-26: qty 2

## 2016-12-26 MED ORDER — ALPRAZOLAM 0.25 MG PO TABS
0.2500 mg | ORAL_TABLET | Freq: Two times a day (BID) | ORAL | Status: DC
  Administered 2016-12-26 – 2017-01-01 (×13): 0.25 mg via ORAL
  Filled 2016-12-26 (×13): qty 1

## 2016-12-26 MED ORDER — GABAPENTIN 100 MG PO CAPS
100.0000 mg | ORAL_CAPSULE | Freq: Two times a day (BID) | ORAL | Status: DC
  Administered 2016-12-27 – 2017-01-01 (×11): 100 mg via ORAL
  Filled 2016-12-26 (×11): qty 1

## 2016-12-26 MED ORDER — POTASSIUM CHLORIDE CRYS ER 20 MEQ PO TBCR
20.0000 meq | EXTENDED_RELEASE_TABLET | Freq: Every day | ORAL | Status: AC
  Administered 2016-12-26: 20 meq via ORAL
  Filled 2016-12-26: qty 1

## 2016-12-26 MED ORDER — HALOPERIDOL LACTATE 5 MG/ML IJ SOLN: 2.0000 mg | Freq: Four times a day (QID) | INTRAMUSCULAR | Status: DC | PRN

## 2016-12-26 MED ORDER — AMLODIPINE BESYLATE 5 MG PO TABS
5.0000 mg | ORAL_TABLET | Freq: Every day | ORAL | Status: DC
  Administered 2016-12-26 – 2016-12-28 (×3): 5 mg via ORAL
  Filled 2016-12-26 (×3): qty 1

## 2016-12-26 MED ORDER — HYDRALAZINE HCL 20 MG/ML IJ SOLN
5.0000 mg | INTRAMUSCULAR | Status: DC | PRN
  Administered 2016-12-26 – 2016-12-29 (×2): 5 mg via INTRAVENOUS
  Filled 2016-12-26 (×2): qty 1

## 2016-12-26 NOTE — Care Management Important Message (Signed)
Important Message  Patient Details  Name: Laura Chambers MRN: [id number] Date of Birth: 1941/01/29   Medicare Important Message Given:  Yes    Sherald Barge, RN 12/26/2016, 1:19 PM

## 2016-12-26 NOTE — Progress Notes (Addendum)
Subjective: Interval History:  Patient seems to be more comfortable today. She offers no complaints. She denies any difficulty breathing. She does have also any nausea or vomiting. Objective: Vital signs in last 24 hours: Temp:  [98.1 F (36.7 C)-98.7 F (37.1 C)] 98.7 F (37.1 C) (06/22 1791) Pulse Rate:  [67-76] 67 (06/22 0633) Resp:  [20-24] 24 (06/22 5056) BP: (178-199)/(76-90) 199/86 (06/22 0633) SpO2:  [88 %-98 %] 98 % (06/22 9794) Weight:  [98.6 kg (217 lb 6.4 oz)] 98.6 kg (217 lb 6.4 oz) (06/22 0534) Weight change:   Intake/Output from previous day: 06/21 0701 - 06/22 0700 In: 120 [P.O.:120] Out: 4050 [Urine:4050] Intake/Output this shift: No intake/output data recorded.  Generally: She was sleeping when I went to her room. After she woke up patient's interview workout. Show first of complaints. She states that she slept very well last night. Chest: Decreased breath sound otherwise seems to be clear Heart exam revealed regular rate and rhythm no murmur Extremities: She has 1+ edema bilaterally  Lab Results: No results for input(s): WBC, HGB, HCT, PLT in the last 72 hours. BMET:   Recent Labs  12/24/16 0745 12/25/16 0527  NA 143 143  K 3.9 3.7  CL 111 111  CO2 21* 18*  GLUCOSE 117* 132*  BUN 58* 59*  CREATININE 5.48* 5.26*  CALCIUM 8.2* 8.4*   No results for input(s): PTH in the last 72 hours. Iron Studies:  No results for input(s): IRON, TIBC, TRANSFERRIN, FERRITIN in the last 72 hours.  Studies/Results: No results found.  I have reviewed the patient's current medications.  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 Patient presently off IV fluid and on Lasix. She had 4000 mL of urine output. Patient at this moment denies any nausea or vomiting. Clinically patient is less agitated today. However her renal function seems to be  declining. Possibly from fluid removal. . Problem #2 anemia: Her hemoglobin is low but stable. Possibly secondary to chronic disease. Problem #3 hypertension: Her blood pressure remains high. Problem #4 hyperkalemia: Her potassium is low and declining. This is possibly secondary to diuretics. Problem #5 history of infected prosthesis: Patient off antibiotics. She is afebrile and her white blood cell count is normal. Problem #6 bone and mineral  disorder: Her calcium is in range but her phosphorus is  high.  Problem #7 low CO2 possibly metabolic. Patient is started on sodium bicarbonate. Her CO2 is 21 Problem #8 history of contusion and agitation. Patient is very calm today. She offers no complaints. Still she seems somewhat sleepy but answers questions. Her son was also present. Plan: 1] will start her on half-normal saline with 20 mEq of KCl at 100 mL per hour 2] we will check her renal panel in the morning 3] patient doesn't require dialysis today. I have  discussed with the patient and her son and seems agreable     LOS: 11 days   Hakan Nudelman S 12/26/2016,7:51 AM

## 2016-12-26 NOTE — Progress Notes (Addendum)
Physical Therapy Treatment Patient Details Name: Laura Chambers MRN: [id number] DOB: March 15, 1941 Today's Date: 12/26/2016    History of Present Illness Laura Chambers is a 76yo white female who comes to [facility] from [facility] due to declining kidney function, admitted with AKI. Pt has been at [facility] on IV ABX since hardware removal from remote Left TKA and placement of ABX spacer. Has been receiving rehab at [facility] as well to address mobility deficits. She remains Lt TTWB, and revision surgery has not been scheduled at this point. Prior to sepsis in May, pt was a Corporate investment banker adult, still working, AMB withotu restriction or AD, living alone. PMH: bilat TKA (2001, 2010), Rt anterior THA Laura Chambers 2014), back surgery, anxiety, fibromyalgia, HTN, BLE neuropathy, Bilat foot drop.     PT Comments    Pt presents with decreased cognition this session. Pt able to verbalize her name and aware she is in hospital though unable to recall name of hospital or DOB.  Pt with difficulty making and keep eye contact this session, would verbalize recogniation of comment towards herself though would not make eye contact.  When asked where she was from responded with "Laura Chambers".  Increased cueing, time to complete and assistance with bed mobility and transfer training.  Mod A required with all transfer with increased cueing for hand and foot placement prior STS as well as weight bearing status Lt LE.  Once standing pt became frozen for 4 minutes, would vocalize understanding of request for next movement though did not move.  Increased verbal and tactile cueing to return to sitting on EOB. Pt left in bed with call bell within reach.  RN aware of change in status.    Follow Up Recommendations  SNF     Equipment Recommendations       Recommendations for Other Services OT consult     Precautions / Restrictions Precautions Precautions: Fall;Knee Required Braces or Orthoses:  (Lt knee brace OOB) Knee Immobilizer - Left: On  when out of bed or walking Restrictions Weight Bearing Restrictions: Yes LLE Weight Bearing: Touchdown weight bearing    Mobility  Bed Mobility Overal bed mobility: Needs Assistance Bed Mobility: Supine to Sit     Supine to sit: Mod assist (increased cueing for task) Sit to supine: Mod assist   General bed mobility comments: Increased cueing, time and assistance required with bed mobition  Transfers Overall transfer level: Modified independent Equipment used: Rolling walker (2 wheeled) Transfers: Sit to/from Stand (stood "frozen" upon standing) Sit to Stand: Min assist (Cueing for handplacement, raised bed height for assistance)         General transfer comment: Increased cueing for proper weight bearing restrictions  Ambulation/Gait                 Stairs            Wheelchair Mobility    Modified Rankin (Stroke Patients Only)       Balance                                            Cognition   Behavior During Therapy: Flat affect Overall Cognitive Status: Impaired/Different from baseline Area of Impairment: Orientation;Attention;Memory;Following commands                 Orientation Level: Disoriented to;Place;Time;Situation Current Attention Level: Selective Memory: Decreased short-term memory Following Commands: Follows  one step commands inconsistently       General Comments: Pt able to tell her name and that she was in hospital but could not recall name of hospital or DOB.  When asked where she was from response was "Laura Chambers".      Exercises General Exercises - Lower Extremity Ankle Circles/Pumps: Right;15 reps Heel Slides: Right;10 reps;AAROM Hip ABduction/ADduction: AAROM;Right;10 reps    General Comments        Pertinent Vitals/Pain Pain Assessment: No/denies pain    Home Living                      Prior Function            PT Goals (current goals can now be found in the care plan  section)      Frequency    Min 2X/week      PT Plan Current plan remains appropriate    Co-evaluation              AM-PAC PT "6 Clicks" Daily Activity  Outcome Measure  Difficulty turning over in bed (including adjusting bedclothes, sheets and blankets)?: A Lot Difficulty moving from lying on back to sitting on the side of the bed? : A Lot Difficulty sitting down on and standing up from a chair with arms (e.g., wheelchair, bedside commode, etc,.)?: A Lot Help needed moving to and from a bed to chair (including a wheelchair)?: Total Help needed walking in hospital room?: Total Help needed climbing 3-5 steps with a railing? : Total 6 Click Score: 9    End of Session Equipment Utilized During Treatment: Gait belt Activity Tolerance: Other (comment) (decrease in cognition) Patient left: in bed;with call bell/phone within reach Nurse Communication: Mobility status (Discussed changed in cognition with RN) PT Visit Diagnosis: Difficulty in walking, not elsewhere classified (R26.2);Muscle weakness (generalized) (M62.81)     Time: 8032-1224 PT Time Calculation (min) (ACUTE ONLY): 40 min  Charges:  $Gait Training: 8-22 mins $Therapeutic Activity: 23-37 mins                    G Codes:       Laura Chambers, Laura Chambers; CBIS [phone]   Laura Chambers 12/26/2016, 9:03 AM

## 2016-12-26 NOTE — Progress Notes (Addendum)
PROGRESS NOTE    Laura Chambers  NWG:[id number] DOB: [birth year] DOA: 12/15/2016 PCP: Lelon Perla, MD    Brief Narrative:  Patient is a 76 year old woman who presented from the [facility] on 12/15/16 for further evaluation of worsening acute kidney injury. Her history is significant for hypertension, hyperlipidemia, renal angiomyolipoma, and a left total knee arthroplasty in 2001. The patient had been suffering with worsening left knee pain for at least 6 months. Dr. Jean Rosenthal performed outpatient arthrocentesis on 11/10/2016. The cultures were negative, but cell count showed 28,750 WBC. Subsequent, the patient was admitted to the hospital from 11/11/2016 through 11/19/2016. During that hospitalization, the patient had excision of her left total knee arthroplasty with placement of antibiotic spacer on 11/14/2016. She was initially treated with vancomycin and Zosyn, but this was switched to cefepime on 11/17/2016 secondary to increasing serum creatinine. Intraoperative cultures were negative for growth. The patient was discharged to a skilled nursing facility to finish 6 weeks of antibiotics with the anticipated stop date on 12/26/2016. Routine blood work obtained on 12/15/2016 showed that the patient's serum creatinine had increased to 4.76. As result, the patient was admitted to the hospital for further workup. Nephrology was consulted to assist with management. Postoperatively the patient did require placement of a Foley catheter secondary to urinary retention. The urinary catheter remained in place for 3 weeks and was removed one week prior to this admission. However, due to the patient's AKI foley catheter was placed during this admssion   Assessment & Plan:   Principal Problem:   AKI (acute kidney injury) ([place]) Active Problems:   Infected prosthetic knee joint, sequela   Essential hypertension   Normocytic anemia   Acute metabolic encephalopathy   1. Acute kidney  injury. The patient's baseline creatinine was 0.8-1.1 during her hospitalization May 2018. It started trending up the first week in June 1.42>>>2.02>>>4.76 on admission. -Renal ultrasound ordered for evaluation and it was negative for hydronephrosis. -Nephrology consulted and the working diagnosis is possible interstitial nephritis from antibiotics versus ATN versus prerenal azotemia versus obstructive uropathy. -Patient was started on IV fluids with subsequent IV Lasix added. Her urine output has been nonoliguric, but her creatinine has continued to increase. (At one point IV fluids were discontinued due to perceived volume overload, but were restarted by nephrology). -Urine eosinophils ordered and pending. -Per my discussion with Dr. Lowanda Foster the patient may require hemodialysis if her creatinine does not improve versus transferring her to [facility] for a renal biopsy for more definitive diagnosis.   Metabolic acidosis. The patient's CO2 was low on admission, likely from acute kidney injury. -Oral sodium bicarbonate was started. Her CO2 has normalized.  Essential hypertension. Patient is treated chronically with atenolol. -Due to increasing uncontrolled blood pressure, atenolol was increased from 25-50 mg daily. Amlodipine was also added. -We'll add when necessary IV hydralazine.  Acute metabolic encephalopathy.  Apparently, the patient became agitated and delirious necessitating the start of as needed Haldol. Ativan which was also given resulted in more confusion, therefore it was discontinued. -Per review with Dr. Lowanda Foster, she had been treated with Xanax at the SNF with good response. She had also been treated with gabapentin at the SNF, but it was not restarted on admission. -Today, she appears alert but very confused and definitely not oriented. No apparent cranial nerve deficits. -Her TSH was within normal limits. Her ammonia level was within normal limits. Her vitamin B12 was within normal  limits, but on the low end of normal-not likely  contributing to her encephalopathy-oral supplement started. -Would favor dose of Xanax and gabapentin. Continue to monitor closely.   Prosthetic joint infection. -Patient completed antibiotic course on 12/25/16 with cefepime which was 43 days from the operative excision.  Normocytic anemia. Patient's hemoglobin was 9.9 on admission with a baseline range of 10.0-11.5). -It has fallen to the 8.3 range. This is likely due to worsening renal function and possibly hemodilution with IV fluids. Doubt acute blood loss other than frequent venipunctures. -Her anemia panel revealed a normal total iron, elevated TIBC, and elevated ferritin of 457. Vitamin B-12 was within normal limits but on the lower end of normal-supplement started. -Continue to monitor.  DVT prophylaxis: Subcutaneous heparin Code Status: Full code Family Communication: Family not available Disposition Plan: Discharge to SNF when clinically appropriate. Transfer to [facility] for renal biopsy a possibility.   Consultants:   Nephrology  Procedures:   None  Antimicrobials:   Cefepime 5/14>>> 6/21   Subjective: Patient sitting up in bed alert, but confused. She denies pain or shortness of breath when asked.  Objective: Vitals:   12/25/16 2220 12/25/16 2357 12/26/16 0534 12/26/16 0633  BP: (!) 194/78 (!) 178/76  (!) 199/86  Pulse: 73   67  Resp: 20   (!) 24  Temp: 98.5 F (36.9 C)   98.7 F (37.1 C)  TempSrc: Oral   Oral  SpO2: 94%   98%  Weight:   98.6 kg (217 lb 6.4 oz)   Height:        Intake/Output Summary (Last 24 hours) at 12/26/16 1404 Last data filed at 12/26/16 0900  Gross per 24 hour  Intake              120 ml  Output             3650 ml  Net            -3530 ml   Filed Weights   12/23/16 0522 12/24/16 0500 12/26/16 0534  Weight: 100.5 kg (221 lb 9.6 oz) 101 kg (222 lb 10.6 oz) 98.6 kg (217 lb 6.4 oz)    Examination:  General exam: Appears calm and  comfortable; Confused.  Respiratory system: Decreased breath sounds in the bases. Respiratory effort normal. Cardiovascular system: S1 & S2 heard, RRR. No JVD, murmurs, rubs, gallops or clicks. No pedal edema. Gastrointestinal system: Abdomen is nondistended, soft and nontender; obese. No organomegaly or masses felt. Normal bowel sounds heard. Central nervous system: Alert, but not oriented to time, place, year, or president. She was questioned about Neurontin, and following this, each answer was followed by Neurontin with every question. No obvious cranial nerve deficits. She does follow small commands with encouragement. No anxiousness.  Extremities: No acute hot red joints. Bilateral lower extremity edema left greater than right. Skin: No rashes, lesions or ulcers Psychiatry: Judgement and insight impaired.     Data Reviewed: I have personally reviewed following labs and imaging studies  CBC:  Recent Labs Lab 12/20/16 0522 12/21/16 0522  WBC 10.1 8.4  HGB 8.3* 8.6*  HCT 25.5* 26.6*  MCV 88.5 88.4  PLT 184 300   Basic Metabolic Panel:  Recent Labs Lab 12/22/16 0506 12/23/16 0619 12/24/16 0745 12/25/16 0527 12/26/16 0754  NA 142 142 143 143 144  K 4.0 3.9 3.9 3.7 3.4*  CL 108 111 111 111 108  CO2 23 21* 21* 18* 24  GLUCOSE 97 103* 117* 132* 118*  BUN 63* 61* 58* 59* 62*  CREATININE 5.18* 5.18*  5.48* 5.26* 5.65*  CALCIUM 8.3* 8.1* 8.2* 8.4* 8.3*  PHOS 6.2* 5.7* 5.2* 5.3* 5.8*   GFR: Estimated Creatinine Clearance: 10 mL/min (A) (by C-G formula based on SCr of 5.65 mg/dL (H)). Liver Function Tests:  Recent Labs Lab 12/22/16 0506 12/23/16 0619 12/24/16 0745 12/25/16 0527 12/26/16 0754  ALBUMIN 2.5* 2.5* 2.6* 2.9* 2.8*   No results for input(s): LIPASE, AMYLASE in the last 168 hours.  Recent Labs Lab 12/24/16 1353  AMMONIA 20   Coagulation Profile: No results for input(s): INR, PROTIME in the last 168 hours. Cardiac Enzymes:  Recent Labs Lab  12/24/16 1353  CKTOTAL 35*   BNP (last 3 results) No results for input(s): PROBNP in the last 8760 hours. HbA1C: No results for input(s): HGBA1C in the last 72 hours. CBG: No results for input(s): GLUCAP in the last 168 hours. Lipid Profile: No results for input(s): CHOL, HDL, LDLCALC, TRIG, CHOLHDL, LDLDIRECT in the last 72 hours. Thyroid Function Tests:  Recent Labs  12/24/16 1353  TSH 2.329  FREET4 0.77   Anemia Panel:  Recent Labs  12/24/16 1353  VITAMINB12 299   Sepsis Labs: No results for input(s): PROCALCITON, LATICACIDVEN in the last 168 hours.  No results found for this or any previous visit (from the past 240 hour(s)).       Radiology Studies: No results found.      Scheduled Meds: . amLODipine  5 mg Oral Daily  . atenolol  50 mg Oral Daily  . furosemide  40 mg Intravenous BID  . heparin  5,000 Units Subcutaneous Q8H  . sodium bicarbonate  650 mg Oral BID  . vitamin B-12  500 mcg Oral Daily   Continuous Infusions: . 0.45 % NaCl with KCl 20 mEq / L 100 mL/hr at 12/26/16 1212     LOS: 11 days    Time spent: 35 minutes. Greater than 50% face-to-face time clinically.    Rexene Alberts, MD [facility] Pager [phone]  If 7PM-7AM, please contact night-coverage [email or web address] Password [facility] 12/26/2016, 2:04 PM

## 2016-12-27 ENCOUNTER — Inpatient Hospital Stay (HOSPITAL_COMMUNITY): Payer: PPO

## 2016-12-27 DIAGNOSIS — J918 Pleural effusion in other conditions classified elsewhere: Secondary | ICD-10-CM

## 2016-12-27 LAB — CBC
HEMATOCRIT: 25.8 % — AB (ref 36.0–46.0)
HEMOGLOBIN: 8.5 g/dL — AB (ref 12.0–15.0)
MCH: 28.7 pg (ref 26.0–34.0)
MCHC: 32.9 g/dL (ref 30.0–36.0)
MCV: 87.2 fL (ref 78.0–100.0)
PLATELETS: 212 10*3/uL (ref 150–400)
RBC: 2.96 MIL/uL — AB (ref 3.87–5.11)
RDW: 16.4 % — ABNORMAL HIGH (ref 11.5–15.5)
WBC: 10.3 10*3/uL (ref 4.0–10.5)

## 2016-12-27 LAB — RENAL FUNCTION PANEL
ALBUMIN: 2.6 g/dL — AB (ref 3.5–5.0)
Anion gap: 11 (ref 5–15)
BUN: 64 mg/dL — AB (ref 6–20)
CALCIUM: 7.9 mg/dL — AB (ref 8.9–10.3)
CO2: 27 mmol/L (ref 22–32)
CREATININE: 5.42 mg/dL — AB (ref 0.44–1.00)
Chloride: 103 mmol/L (ref 101–111)
GFR calc Af Amer: 8 mL/min — ABNORMAL LOW (ref >60)
GFR calc non Af Amer: 7 mL/min — ABNORMAL LOW (ref >60)
GLUCOSE: 97 mg/dL (ref 65–99)
PHOSPHORUS: 4.9 mg/dL — AB (ref 2.5–4.6)
Potassium: 3.5 mmol/L (ref 3.5–5.1)
SODIUM: 141 mmol/L (ref 135–145)

## 2016-12-27 LAB — ECHOCARDIOGRAM COMPLETE
HEIGHTINCHES: 66 in
Weight: 3433.6 oz

## 2016-12-27 LAB — GLUCOSE, CAPILLARY: Glucose-Capillary: 101 mg/dL — ABNORMAL HIGH (ref 65–99)

## 2016-12-27 NOTE — Progress Notes (Signed)
Echo Done

## 2016-12-27 NOTE — Progress Notes (Signed)
PROGRESS NOTE    Laura Chambers  UJW:[id number] DOB: April 09, 1941 DOA: 12/15/2016 PCP: Lelon Perla, MD    Brief Narrative:  Patient is a 76 year old woman who presented from the [facility] on 12/15/16 for further evaluation of worsening acute kidney injury. Her history is significant for hypertension, hyperlipidemia, renal angiomyolipoma, and a left total knee arthroplasty in 2001. The patient had been suffering with worsening left knee pain for at least 6 months. Dr. Jean Rosenthal performed outpatient arthrocentesis on 11/10/2016. The cultures were negative, but cell count showed 28,750 WBC. Subsequent, the patient was admitted to the hospital from 11/11/2016 through 11/19/2016. During that hospitalization, the patient had excision of her left total knee arthroplasty with placement of antibiotic spacer on 11/14/2016. She was initially treated with vancomycin and Zosyn, but this was switched to cefepime on 11/17/2016 secondary to increasing serum creatinine. Intraoperative cultures were negative for growth. The patient was discharged to a skilled nursing facility to finish 6 weeks of antibiotics with the anticipated stop date on 12/26/2016. Routine blood work obtained on 12/15/2016 showed that the patient's serum creatinine had increased to 4.76. As result, the patient was admitted to the hospital for further workup. Nephrology was consulted to assist with management. Postoperatively the patient did require placement of a Foley catheter secondary to urinary retention. The urinary catheter remained in place for 3 weeks and was removed one week prior to this admission. However, due to the patient's AKI foley catheter was placed during this admssion.   Assessment & Plan:   Principal Problem:   AKI (acute kidney injury) ([place]) Active Problems:   Infected prosthetic knee joint, sequela   Essential hypertension   Normocytic anemia   Acute metabolic encephalopathy   1. Acute kidney  injury. The patient's baseline creatinine was 0.8-1.1 during her hospitalization May 2018. It started trending up the first week in June 1.42>>>2.02>>>4.76 on admission. -Renal ultrasound ordered for evaluation and it was negative for hydronephrosis. -Nephrology consulted and the working diagnosis is possible interstitial nephritis from antibiotics versus ATN versus prerenal azotemia versus obstructive uropathy. -Patient was started on IV fluids with subsequent IV Lasix added. Her urine output has been nonoliguric, but her creatinine has continued to increase. (At one point IV fluids were discontinued due to perceived volume overload, but were restarted by nephrology). -Urine eosinophils ordered and are still pending. -Her son had previously contemplated having the patient transferred to [facility] for renal biopsy, but he no longer desires this. Apparently, his mother voiced that she did not want a biopsy. - Although potential dialysis was a concern, Dr. Lowanda Foster does not believe she requires dialysis at this time. -We'll continue to monitor her renal function over the weekend. Creatinine 5.4 to today down from 5.65 on 6/22.  Metabolic acidosis. The patient's CO2 was low on admission, likely from acute kidney injury. -Oral sodium bicarbonate was started. Her CO2 has normalized. -Bicarbonate was discontinued on 6/23.  Essential hypertension. Patient is treated chronically with atenolol. -Due to increasing uncontrolled blood pressure, atenolol was increased from 25>50 mg daily. Amlodipine was also added. -When necessary IV hydralazine was ordered on 6/22 for SBP of greater than 180. -Her blood pressure is improving.  Acute metabolic encephalopathy.  Apparently, the patient became agitated and delirious necessitating the start of as needed Haldol several days ago. Ativan which was also given resulted in more confusion, therefore it was discontinued. -Per review with Dr. Lowanda Foster, she had been treated  with Xanax at the SNF with good response. She  had also been treated with gabapentin at the SNF, but it was not restarted on admission. -Her TSH was within normal limits. Her ammonia level was within normal limits. Her vitamin B12 was within normal limits, but on the low end of normal-not likely contributing to her encephalopathy-oral supplement started. -Following my conversation with her son on 6/22, additional studies were ordered>>> repeat ammonia level 20; ABG completely normal; UA with trace leukocyte; CT head with white matter hypoattenuation posteriorly, but no mass-infarction-or hemorrhage. -Per her son, the patient had been on a course of Xanax which helped her "nerves". She had also been on gabapentin per review of the MRI which had not been started. -Scheduled small dosing of Xanax 0.25 mg twice a day and gabapentin at 100 mg twice a day (dose decreased due to AK I) restarted on 6/22. -Patient appears to be more alert and rested today, but not back to baseline according to her son. -Etiology is unclear, but it could be related to metabolic derangements from her AKI.  Abnormal head CT. -As above, head CT on 6/22 noted for white matter hypoattenuation posteriorly. -We'll consider MRI of the brain if it can be done as the son reports "metal rods in her back".  Patchy bilateral airspace opacities, on chest x-ray. Chest x-ray on 6/22 revealed vascular congestion and patchy bilateral airspace opacities left greater than right "may reflect pneumonia" per radiology. -Chest CT on 11/18/16 revealed scattered groundglass opacities bilaterally. -Patient does not appear to have pneumonia clinically as she does not have a fever, her white blood cell count is not elevated, and she has not had a cough with or without sputum. -Query if these are chronic changes versus volume overload from IV fluids, although she is getting IV Lasix. -2-D echo ordered for evaluation. Will also order BNP.  Prosthetic joint  infection. -Patient completed antibiotic course on 12/25/16 with cefepime which was 43 days from the operative excision.  Normocytic anemia. Patient's hemoglobin was 9.9 on admission with a baseline range of 10.0-11.5). -It has fallen to the 8.3-8.5 range. This is likely due to worsening renal function and possibly hemodilution with IV fluids. Doubt acute blood loss other than frequent venipunctures. -Her anemia panel revealed a normal total iron, elevated TIBC, and elevated ferritin of 457. Vitamin B-12 was within normal limits but on the lower end of normal-supplement started. -Continue to monitor.  DVT prophylaxis: Subcutaneous heparin Code Status: Full code Family Communication: Family not available Disposition Plan: Discharge to SNF when clinically appropriate. Transfer to [facility] for renal biopsy a possibility.   Consultants:   Nephrology  Procedures:   2-D echo 6/23, pending.  Antimicrobials:   Cefepime 5/14>>> 6/21   Subjective: Patient sitting up in bed alert, but less confused. She denies chest pain, shortness of breath, abdominal pain. Her son is in the room. He informs the dictating physician that the patient was asking if she was paralyzed.  Objective: Vitals:   12/26/16 1703 12/26/16 1727 12/26/16 2128 12/27/16 0444  BP: (!) 197/81 (!) 190/55 (!) 183/69 (!) 165/59  Pulse: 77 78 72 63  Resp: 20  18 20   Temp: 98.5 F (36.9 C)  98.6 F (37 C) 98.4 F (36.9 C)  TempSrc: Oral  Oral Axillary  SpO2: 96% 98% 94% 98%  Weight:    97.3 kg (214 lb 9.6 oz)  Height:        Intake/Output Summary (Last 24 hours) at 12/27/16 1132 Last data filed at 12/27/16 0454  Gross per 24 hour  Intake             1600 ml  Output             2950 ml  Net            -1350 ml   Filed Weights   12/24/16 0500 12/26/16 0534 12/27/16 0444  Weight: 101 kg (222 lb 10.6 oz) 98.6 kg (217 lb 6.4 oz) 97.3 kg (214 lb 9.6 oz)    Examination:  General exam: Appears calm and comfortable;  Alert and less confused.  Respiratory system: Few crackles bibasilar. Respiratory effort normal. Cardiovascular system: S1 & S2 heard, RRR. No JVD, murmurs, rubs, gallops or clicks. No pedal edema. Gastrointestinal system: Abdomen is nondistended, soft and nontender; obese. No organomegaly or masses felt. Normal bowel sounds heard. Central nervous system: Alert, and oriented to the hospital and her son. Her speech is clear. Cranial nerves appear to be grossly intact, with the exception of query faint right facial droop. She is able to wiggle both of her toes upon command. She has a fair handgrip bilaterally. No anxiousness.  Extremities: No acute hot red joints. Left knee scar noted. Bilateral lower extremity edema-trace left greater than right. Skin: No rashes, lesions or ulcers Psychiatry: Flat affect.    Data Reviewed: I have personally reviewed following labs and imaging studies  CBC:  Recent Labs Lab 12/21/16 0522 12/27/16 0626  WBC 8.4 10.3  HGB 8.6* 8.5*  HCT 26.6* 25.8*  MCV 88.4 87.2  PLT 191 417   Basic Metabolic Panel:  Recent Labs Lab 12/23/16 0619 12/24/16 0745 12/25/16 0527 12/26/16 0754 12/27/16 0626  NA 142 143 143 144 141  K 3.9 3.9 3.7 3.4* 3.5  CL 111 111 111 108 103  CO2 21* 21* 18* 24 27  GLUCOSE 103* 117* 132* 118* 97  BUN 61* 58* 59* 62* 64*  CREATININE 5.18* 5.48* 5.26* 5.65* 5.42*  CALCIUM 8.1* 8.2* 8.4* 8.3* 7.9*  PHOS 5.7* 5.2* 5.3* 5.8* 4.9*   GFR: Estimated Creatinine Clearance: 10.4 mL/min (A) (by C-G formula based on SCr of 5.42 mg/dL (H)). Liver Function Tests:  Recent Labs Lab 12/23/16 4081 12/24/16 0745 12/25/16 0527 12/26/16 0754 12/27/16 0626  ALBUMIN 2.5* 2.6* 2.9* 2.8* 2.6*   No results for input(s): LIPASE, AMYLASE in the last 168 hours.  Recent Labs Lab 12/24/16 1353 12/26/16 1850  AMMONIA 20 19   Coagulation Profile: No results for input(s): INR, PROTIME in the last 168 hours. Cardiac Enzymes:  Recent  Labs Lab 12/24/16 1353  CKTOTAL 35*   BNP (last 3 results) No results for input(s): PROBNP in the last 8760 hours. HbA1C: No results for input(s): HGBA1C in the last 72 hours. CBG:  Recent Labs Lab 12/27/16 0044  GLUCAP 101*   Lipid Profile: No results for input(s): CHOL, HDL, LDLCALC, TRIG, CHOLHDL, LDLDIRECT in the last 72 hours. Thyroid Function Tests:  Recent Labs  12/24/16 1353  TSH 2.329  FREET4 0.77   Anemia Panel:  Recent Labs  12/24/16 1353  VITAMINB12 299   Sepsis Labs: No results for input(s): PROCALCITON, LATICACIDVEN in the last 168 hours.  No results found for this or any previous visit (from the past 240 hour(s)).       Radiology Studies: Dg Chest 1 View  Result Date: 12/26/2016 CLINICAL DATA:  Acute onset of shortness of breath. Acute renal insufficiency. Initial encounter. EXAM: CHEST 1 VIEW COMPARISON:  CTA of the chest performed 11/18/2016, and chest radiograph performed 08/25/2012 FINDINGS:  The lungs are well-aerated. Mild vascular congestion is noted. Patchy bilateral airspace opacities, left greater than right, may reflect pneumonia. The appearance is less typical for asymmetric interstitial edema. Small bilateral pleural effusions are suggested. No pneumothorax is seen. A left PICC is noted ending about the proximal SVC. The cardiomediastinal silhouette is mildly enlarged. No acute osseous abnormalities are seen. IMPRESSION: Mild vascular congestion and mild cardiomegaly. Patchy bilateral airspace opacities, left greater than right, may reflect pneumonia. The appearance is less typical for asymmetric interstitial edema. Small bilateral pleural effusions suggested. Electronically Signed   By: Garald Balding M.D.   On: 12/26/2016 19:40   Ct Head Wo Contrast  Result Date: 12/26/2016 CLINICAL DATA:  Confusion.  Altered mental status. EXAM: CT HEAD WITHOUT CONTRAST TECHNIQUE: Contiguous axial images were obtained from the base of the skull through  the vertex without intravenous contrast. COMPARISON:  None. FINDINGS: Brain: No evidence of acute infarction, hemorrhage, hydrocephalus, extra-axial collection or mass lesion/mass effect. There is white matter hypoattenuation, which is most evident in the posterior white matter of the parietal and occipital lobes. This is fairly symmetric. The ventricles and sulci are mildly enlarged consistent with mild diffuse atrophy. Vascular: No hyperdense vessel or unexpected calcification. Skull: Normal. Negative for fracture or focal lesion. Sinuses/Orbits: Globes and orbits are unremarkable. Visualized sinuses and mastoid air cells are clear. Other: None. IMPRESSION: 1. There is white matter hypoattenuation that is prominent posteriorly. Although this is nonspecific, consider PRES. Followup brain MRI would be helpful to further characterize this appearance. 2. There is no evidence of a mass cysts, recent infarction or of intracranial hemorrhage. Electronically Signed   By: Lajean Manes M.D.   On: 12/26/2016 20:39        Scheduled Meds: . ALPRAZolam  0.25 mg Oral BID  . amLODipine  5 mg Oral Daily  . atenolol  50 mg Oral Daily  . furosemide  40 mg Intravenous BID  . gabapentin  100 mg Oral BID  . heparin  5,000 Units Subcutaneous Q8H  . vitamin B-12  500 mcg Oral Daily   Continuous Infusions: . 0.45 % NaCl with KCl 20 mEq / L 100 mL/hr at 12/27/16 0044     LOS: 12 days    Time spent: 35 minutes. Greater than 50% face-to-face time clinically.    Rexene Alberts, MD [facility] Pager [phone]  If 7PM-7AM, please contact night-coverage [email or web address] Password [facility] 12/27/2016, 11:32 AM

## 2016-12-27 NOTE — Progress Notes (Signed)
Subjective: Interval History:  Patient is more alert today. At this moment her son was present. Patient denies any nausea or vomiting. She denies also any difficulty breathing. Last night she rested very well. The main complaints from her son is that she is weak and she is not able to move. Patient has this moment doesn't give any explanation. She complains of some arm pain. Objective: Vital signs in last 24 hours: Temp:  [98.4 F (36.9 C)-99.1 F (37.3 C)] 98.4 F (36.9 C) (06/23 0444) Pulse Rate:  [63-78] 63 (06/23 0444) Resp:  [18-24] 20 (06/23 0444) BP: (165-216)/(55-81) 165/59 (06/23 0444) SpO2:  [94 %-98 %] 98 % (06/23 0444) Weight:  [97.3 kg (214 lb 9.6 oz)] 97.3 kg (214 lb 9.6 oz) (06/23 0444) Weight change: -1.27 kg (-2 lb 12.8 oz)  Intake/Output from previous day: 06/22 0701 - 06/23 0700 In: 0630 [P.O.:240; I.V.:1480] Out: 3750 [Urine:3750] Intake/Output this shift: No intake/output data recorded.  Generally:  Chest: Decreased breath sound otherwise seems to be clear Heart exam revealed regular rate and rhythm no murmur Extremities: She has 1+ edema bilaterally  Lab Results:  Recent Labs  12/27/16 0626  WBC 10.3  HGB 8.5*  HCT 25.8*  PLT 212   BMET:   Recent Labs  12/26/16 0754 12/27/16 0626  NA 144 141  K 3.4* 3.5  CL 108 103  CO2 24 27  GLUCOSE 118* 97  BUN 62* 64*  CREATININE 5.65* 5.42*  CALCIUM 8.3* 7.9*   No results for input(s): PTH in the last 72 hours. Iron Studies:  No results for input(s): IRON, TIBC, TRANSFERRIN, FERRITIN in the last 72 hours.  Studies/Results: Dg Chest 1 View  Result Date: 12/26/2016 CLINICAL DATA:  Acute onset of shortness of breath. Acute renal insufficiency. Initial encounter. EXAM: CHEST 1 VIEW COMPARISON:  CTA of the chest performed 11/18/2016, and chest radiograph performed 08/25/2012 FINDINGS: The lungs are well-aerated. Mild vascular congestion is noted. Patchy bilateral airspace opacities, left greater than  right, may reflect pneumonia. The appearance is less typical for asymmetric interstitial edema. Small bilateral pleural effusions are suggested. No pneumothorax is seen. A left PICC is noted ending about the proximal SVC. The cardiomediastinal silhouette is mildly enlarged. No acute osseous abnormalities are seen. IMPRESSION: Mild vascular congestion and mild cardiomegaly. Patchy bilateral airspace opacities, left greater than right, may reflect pneumonia. The appearance is less typical for asymmetric interstitial edema. Small bilateral pleural effusions suggested. Electronically Signed   By: Garald Balding M.D.   On: 12/26/2016 19:40   Ct Head Wo Contrast  Result Date: 12/26/2016 CLINICAL DATA:  Confusion.  Altered mental status. EXAM: CT HEAD WITHOUT CONTRAST TECHNIQUE: Contiguous axial images were obtained from the base of the skull through the vertex without intravenous contrast. COMPARISON:  None. FINDINGS: Brain: No evidence of acute infarction, hemorrhage, hydrocephalus, extra-axial collection or mass lesion/mass effect. There is white matter hypoattenuation, which is most evident in the posterior white matter of the parietal and occipital lobes. This is fairly symmetric. The ventricles and sulci are mildly enlarged consistent with mild diffuse atrophy. Vascular: No hyperdense vessel or unexpected calcification. Skull: Normal. Negative for fracture or focal lesion. Sinuses/Orbits: Globes and orbits are unremarkable. Visualized sinuses and mastoid air cells are clear. Other: None. IMPRESSION: 1. There is white matter hypoattenuation that is prominent posteriorly. Although this is nonspecific, consider PRES. Followup brain MRI would be helpful to further characterize this appearance. 2. There is no evidence of a mass cysts, recent infarction or of  intracranial hemorrhage. Electronically Signed   By: Lajean Manes M.D.   On: 12/26/2016 20:39    I have reviewed the patient's current  medications.  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 Her BUN and creatinine started improving today. Her renal function has been fluctuating. She had about 3700 mL of urine output and remains on Lasix. Problem #2 anemia: Her hemoglobin is low but stable. Possibly secondary to chronic disease. Problem #3 hypertension: Her blood pressure remains high but better. Presently amlodipine 5 mg was added to her management. Problem #4 hyperkalemia: Her potassium has corrected. She is getting potassium with her IV fluid. Problem #5 history of infected prosthesis: Patient off antibiotics. She is afebrile and her white blood cell count is normal. Problem #6 bone and mineral  disorder: Her calcium is in range but her phosphorus is  high.  Problem #7 low CO2 possibly metabolic. Patient is started on sodium bicarbonate. Her CO2 is 27. Has corrected. Problem #8 history of contusion and agitation. Patient is very calm today. Patient at this moment seems to be comfortable and answering questions. Plan: 1] will continue with present management. 2] we will check her renal panel in the morning 3]  as this moment patient doesn't seem to be requiring dialysis. I have discussed with the patient and her son at this moment seems to be comfortable with that decision.  4] we'll DC sodium bicarbonate.     LOS: 12 days   Gala Padovano S 12/27/2016,8:45 AM

## 2016-12-28 LAB — RENAL FUNCTION PANEL
ANION GAP: 10 (ref 5–15)
Albumin: 2.6 g/dL — ABNORMAL LOW (ref 3.5–5.0)
BUN: 67 mg/dL — ABNORMAL HIGH (ref 6–20)
CALCIUM: 8.1 mg/dL — AB (ref 8.9–10.3)
CHLORIDE: 101 mmol/L (ref 101–111)
CO2: 29 mmol/L (ref 22–32)
Creatinine, Ser: 5.34 mg/dL — ABNORMAL HIGH (ref 0.44–1.00)
GFR calc Af Amer: 8 mL/min — ABNORMAL LOW (ref >60)
GFR calc non Af Amer: 7 mL/min — ABNORMAL LOW (ref >60)
GLUCOSE: 95 mg/dL (ref 65–99)
POTASSIUM: 3.7 mmol/L (ref 3.5–5.1)
Phosphorus: 5.1 mg/dL — ABNORMAL HIGH (ref 2.5–4.6)
SODIUM: 140 mmol/L (ref 135–145)

## 2016-12-28 MED ORDER — ACETAMINOPHEN 325 MG PO TABS
650.0000 mg | ORAL_TABLET | ORAL | Status: DC | PRN
  Administered 2016-12-28 (×3): 650 mg via ORAL
  Filled 2016-12-28 (×3): qty 2

## 2016-12-28 MED ORDER — AMLODIPINE BESYLATE 5 MG PO TABS
10.0000 mg | ORAL_TABLET | Freq: Every day | ORAL | Status: DC
  Administered 2016-12-29 – 2017-01-01 (×4): 10 mg via ORAL
  Filled 2016-12-28 (×4): qty 2

## 2016-12-28 NOTE — Progress Notes (Signed)
Subjective: Interval History:  Patient complains of back pain, leg pain, heel pain. Her main problem seems to be pain all over her body. Her breathing is okay. Her appetite is not that great but she doesn't have any nausea or vomiting. Objective: Vital signs in last 24 hours: Temp:  [98 F (36.7 C)-98.8 F (37.1 C)] 98.4 F (36.9 C) (06/24 0644) Pulse Rate:  [63-68] 64 (06/24 0644) Resp:  [18-19] 18 (06/24 0644) BP: (161-201)/(70-82) 190/72 (06/24 0901) SpO2:  [97 %-98 %] 98 % (06/24 0644) Weight:  [97.2 kg (214 lb 4.6 oz)] 97.2 kg (214 lb 4.6 oz) (06/24 0644) Weight change: -0.142 kg (-5 oz)  Intake/Output from previous day: 06/23 0701 - 06/24 0700 In: 960 [P.O.:960] Out: 3750 [Urine:3750] Intake/Output this shift: Total I/O In: 120 [P.O.:120] Out: -   Generally:  Chest: Decreased breath sound otherwise seems to be clear Heart exam revealed regular rate and rhythm no murmur Extremities: She has 1+ edema bilaterally  Lab Results:  Recent Labs  12/27/16 0626  WBC 10.3  HGB 8.5*  HCT 25.8*  PLT 212   BMET:   Recent Labs  12/27/16 0626 12/28/16 0604  NA 141 140  K 3.5 3.7  CL 103 101  CO2 27 29  GLUCOSE 97 95  BUN 64* 67*  CREATININE 5.42* 5.34*  CALCIUM 7.9* 8.1*   No results for input(s): PTH in the last 72 hours. Iron Studies:  No results for input(s): IRON, TIBC, TRANSFERRIN, FERRITIN in the last 72 hours.  Studies/Results: Dg Chest 1 View  Result Date: 12/26/2016 CLINICAL DATA:  Acute onset of shortness of breath. Acute renal insufficiency. Initial encounter. EXAM: CHEST 1 VIEW COMPARISON:  CTA of the chest performed 11/18/2016, and chest radiograph performed 08/25/2012 FINDINGS: The lungs are well-aerated. Mild vascular congestion is noted. Patchy bilateral airspace opacities, left greater than right, may reflect pneumonia. The appearance is less typical for asymmetric interstitial edema. Small bilateral pleural effusions are suggested. No pneumothorax  is seen. A left PICC is noted ending about the proximal SVC. The cardiomediastinal silhouette is mildly enlarged. No acute osseous abnormalities are seen. IMPRESSION: Mild vascular congestion and mild cardiomegaly. Patchy bilateral airspace opacities, left greater than right, may reflect pneumonia. The appearance is less typical for asymmetric interstitial edema. Small bilateral pleural effusions suggested. Electronically Signed   By: Garald Balding M.D.   On: 12/26/2016 19:40   Ct Head Wo Contrast  Result Date: 12/26/2016 CLINICAL DATA:  Confusion.  Altered mental status. EXAM: CT HEAD WITHOUT CONTRAST TECHNIQUE: Contiguous axial images were obtained from the base of the skull through the vertex without intravenous contrast. COMPARISON:  None. FINDINGS: Brain: No evidence of acute infarction, hemorrhage, hydrocephalus, extra-axial collection or mass lesion/mass effect. There is white matter hypoattenuation, which is most evident in the posterior white matter of the parietal and occipital lobes. This is fairly symmetric. The ventricles and sulci are mildly enlarged consistent with mild diffuse atrophy. Vascular: No hyperdense vessel or unexpected calcification. Skull: Normal. Negative for fracture or focal lesion. Sinuses/Orbits: Globes and orbits are unremarkable. Visualized sinuses and mastoid air cells are clear. Other: None. IMPRESSION: 1. There is white matter hypoattenuation that is prominent posteriorly. Although this is nonspecific, consider PRES. Followup brain MRI would be helpful to further characterize this appearance. 2. There is no evidence of a mass cysts, recent infarction or of intracranial hemorrhage. Electronically Signed   By: Lajean Manes M.D.   On: 12/26/2016 20:39    I have reviewed the  patient's current medications.  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 She had about 3700 mL of urine output and remains on Lasix. Her renal function very slowly seems to be improving. Presently she doesn't have any nausea or vomiting. Problem #2 anemia: Her hemoglobin is low but stable. Possibly secondary to chronic disease. Problem #3 hypertension: Her blood pressure remains high . Problem #4 hyperkalemia: Her potassium is normal. She is on potassium supplement. Problem #5 history of infected prosthesis: Patient off antibiotics. She is afebrile and her white blood cell count is normal. Problem #6 bone and mineral  disorder: Her calcium is in range but her phosphorus is  high.  Problem #7 low CO2 possibly metabolic. Patient is started on sodium bicarbonate. Her CO2 is 29 has improved. Patient is off sodium bicarbonate. Problem #8 history of confusion and agitation. Possibly from medication and as this moment she looks somewhat better. Plan: 1] will increase IV fluid to 1 25 mL per hour 2] we will check her renal panel in the morning 3] we'll increase amlodipine to 10 mg once a day .      LOS: 13 days   Laura Chambers S 12/28/2016,10:54 AM

## 2016-12-28 NOTE — Progress Notes (Signed)
Pt placed in chair using lift. Pt is too weak to stand with walker to pivot

## 2016-12-28 NOTE — Progress Notes (Signed)
PROGRESS NOTE    Laura Chambers  VPX:[id number] DOB: 1940/12/14 DOA: 12/15/2016 PCP: Lelon Perla, MD    Brief Narrative:  Patient is a 76 year old woman who presented from the [facility] on 12/15/16 for further evaluation of worsening acute kidney injury. Her history is significant for hypertension, hyperlipidemia, renal angiomyolipoma, and a left total knee arthroplasty in 2001. The patient had been suffering with worsening left knee pain for at least 6 months. Dr. Jean Rosenthal performed outpatient arthrocentesis on 11/10/2016. The cultures were negative, but cell count showed 28,750 WBC. Subsequent, the patient was admitted to the hospital from 11/11/2016 through 11/19/2016. During that hospitalization, the patient had excision of her left total knee arthroplasty with placement of antibiotic spacer on 11/14/2016. She was initially treated with vancomycin and Zosyn, but this was switched to cefepime on 11/17/2016 secondary to increasing serum creatinine. Intraoperative cultures were negative for growth. The patient was discharged to a skilled nursing facility to finish 6 weeks of antibiotics with the anticipated stop date on 12/26/2016. Routine blood work obtained on 12/15/2016 showed that the patient's serum creatinine had increased to 4.76. As result, the patient was admitted to the hospital for further workup. Nephrology was consulted to assist with management. Postoperatively the patient did require placement of a Foley catheter secondary to urinary retention. The urinary catheter remained in place for 3 weeks and was removed one week prior to this admission. However, due to the patient's AKI, a foley catheter was placed during this [facility] course has been complicated by the patient's encephalopathy, but it appears to be resolving. Xanax and gabapentin were restarted. -Her creatinine trended up to a high of 5.65, but some demonstrated improvement over the past 2  days. -Her son longer desires a renal biopsy for the patient. -Patient's blood pressure has progressively increased. BP meds have been added and titrated.    Assessment & Plan:   Principal Problem:   AKI (acute kidney injury) ([place]) Active Problems:   Infected prosthetic knee joint, sequela   Essential hypertension   Normocytic anemia   Acute metabolic encephalopathy   1. Acute kidney injury. The patient's baseline creatinine was 0.8-1.1 during her hospitalization May 2018. It started trending up the first week in June 1.42>>>2.02>>>4.76 on admission. -Renal ultrasound ordered for evaluation and it was negative for hydronephrosis. -Nephrology consulted and the working diagnosis is possible interstitial nephritis from antibiotics versus ATN versus prerenal azotemia versus obstructive uropathy. -Patient was started on IV fluids with subsequent IV Lasix added. Her urine output has been nonoliguric, but her creatinine has continued to increase. (At one point IV fluids were discontinued due to perceived volume overload, but were restarted by nephrology). -Urine eosinophils ordered and are still pending. -Her son had previously contemplated having the patient transferred to [facility] for renal biopsy, but he no longer desires this. Apparently, his mother voiced that she did not want a biopsy. - Although potential dialysis was a concern, Dr. Lowanda Foster does not believe she requires dialysis at this time. -Her urine output has been nonoliguric. -We'll continue to monitor her renal function for hopeful progressive improvement. Creatinine 5.34  today down from 5.65 on 6/22.  Metabolic acidosis. The patient's CO2 was low on admission, likely from acute kidney injury. -Oral sodium bicarbonate was started. Her CO2 has normalized. -Bicarbonate was discontinued on 6/23.  Essential hypertension. Patient is treated chronically with atenolol. -Due to increasing uncontrolled blood pressure, atenolol was  increased from 25>50 mg daily. Amlodipine was also added. -Amlodipine increased  to 10 mg on 6/24. -When necessary IV hydralazine was ordered on 6/22 for SBP of greater than 180.  Acute metabolic encephalopathy.  Apparently, the patient became agitated and delirious necessitating the start of as needed Haldol several days ago. Ativan which was also given resulted in more confusion, therefore it was discontinued. -Per review with Dr. Lowanda Foster, she had been treated with Xanax at the SNF with good response. She had also been treated with gabapentin at the SNF, but it was not restarted on admission. -Her TSH was within normal limits. Her ammonia level was within normal limits. Her vitamin B12 was within normal limits, but on the low end of normal-not likely contributing to her encephalopathy-oral supplement started. -Following my conversation with her son on 6/22, additional studies were ordered>>> repeat ammonia level 20; ABG completely normal; UA with trace leukocyte; CT head with white matter hypoattenuation posteriorly, but no mass-infarction-or hemorrhage. -Per her son, the patient had been on a course of Xanax which helped her "nerves". She had also been on gabapentin per review of the [facility] but had not been receiving it. -Scheduled small dosing of Xanax 0.25 mg twice a day and gabapentin at 100 mg twice a day (dose decreased due to AKI) started on 6/22. -Patient is much more alert and oriented to place, family, and surprisingly to the dictating physician "Dr. Caryn Section". -Etiology is unclear, but it could be related to metabolic derangements from her AKI and possibly being off of Xanax and gabapentin.  Abnormal head CT. -As above, head CT on 6/22 noted for white matter hypoattenuation posteriorly. -We'll consider MRI of the brain if it can be done as the son reports "metal rods in her back" and/or if she does not improve. -Due to the patient's resolving encephalopathy, will hold off on the brain  MRI.  Patchy bilateral airspace opacities, on chest x-ray, possibly due to acute diastolic heart failure in the setting of AKI. Chest x-ray on 6/22 revealed vascular congestion and patchy bilateral airspace opacities left greater than right "may reflect pneumonia" per radiology. -Chest CT on 11/18/16 revealed scattered groundglass opacities bilaterally. -Patient does not appear to have pneumonia clinically as she does not have a fever, her white blood cell count is not elevated, and she has not had a cough with or without sputum. -Query if these are chronic changes versus volume overload from IV fluids, although she is getting IV Lasix. -2-D echo revealed an EF of 60-65%, Doppler parameters consistent with high ventricular filling pressure, and PA pressure of 44 mmHg. -Query etiology diastolic dysfunction in the setting of worsening renal function leading to possible acute diastolic failure. -The patient remains on IV Lasix and IV fluids per nephrology. She does not appear to be in respiratory distress. -Her I's/O's have been negative.  Prosthetic joint infection. -Patient completed antibiotic course on 12/25/16 with cefepime which was 43 days from the operative excision.  Normocytic anemia. Patient's hemoglobin was 9.9 on admission with a baseline range of 10.0-11.5). -It has fallen to the 8.3-8.5 range. This is likely due to worsening renal function and possibly hemodilution with IV fluids. Doubt acute blood loss other than frequent venipunctures. -Her anemia panel revealed a normal total iron, elevated TIBC, and elevated ferritin of 457. Vitamin B-12 was within normal limits but on the lower end of normal-supplement started. -Continue to monitor.  DVT prophylaxis: Subcutaneous heparin Code Status: Full code Family Communication: Discussed with niece on 6/24 and with her son on 6/22 and 6/23.  Disposition Plan: Discharge  to SNF when clinically appropriate. Transfer to [facility] for renal biopsy a  possibility.   Consultants:   Nephrology  Procedures:  2-D echo 6/23 - Study Conclusions - Left ventricle: The cavity size was normal. Wall thickness was   increased in a pattern of mild LVH. Systolic function was normal.   The estimated ejection fraction was in the range of 60% to 65%.   Wall motion was normal; there were no regional wall motion   abnormalities. The study is not technically sufficient to allow   evaluation of LV diastolic function. Doppler parameters are   consistent with high ventricular filling pressure. - Aortic valve: Transvalvular velocity was within the normal range.   There was no stenosis. There was no regurgitation. - Mitral valve: Mildly calcified annulus. Transvalvular velocity   was within the normal range. There was no evidence for stenosis.   There was trivial regurgitation. - Right ventricle: The cavity size was normal. Wall thickness was   normal. Systolic function was normal. - Tricuspid valve: There was trivial regurgitation. - Pulmonary arteries: Systolic pressure was mildly increased. PA    peak pressure: 44 mm Hg (S).  Antimicrobials:   Cefepime 5/14>>> 6/21   Subjective: Patient sitting up in bed alert and significantly more oriented. She has no complaints of headache, chest pain, shortness of breath, or abdominal pain, but she does have some achiness all over. She was to try to yet OOB if possible.  Objective: Vitals:   12/27/16 2100 12/28/16 0644 12/28/16 0900 12/28/16 0901  BP: (!) 161/70 (!) 191/76 (!) 201/71 (!) 190/72  Pulse: 63 64    Resp: 18 18    Temp: 98.8 F (37.1 C) 98.4 F (36.9 C)    TempSrc: Oral Oral    SpO2: 97% 98%    Weight:  97.2 kg (214 lb 4.6 oz)    Height:        Intake/Output Summary (Last 24 hours) at 12/28/16 1110 Last data filed at 12/28/16 0900  Gross per 24 hour  Intake              720 ml  Output             3750 ml  Net            -3030 ml   Filed Weights   12/26/16 0534 12/27/16 0444  12/28/16 0644  Weight: 98.6 kg (217 lb 6.4 oz) 97.3 kg (214 lb 9.6 oz) 97.2 kg (214 lb 4.6 oz)    Examination:  General exam: Appears calm and comfortable; Alert and more oriented.  Respiratory system: Few crackles bibasilar. Respiratory effort normal. Cardiovascular system: S1 & S2 heard, RRR. No JVD, murmurs, rubs, gallops or clicks. No pedal edema. Gastrointestinal system: Abdomen is nondistended, soft and nontender; obese. No organomegaly or masses felt. Normal bowel sounds heard. Central nervous system: Alert, and oriented to the hospital, niece, and dictating physician.  Her speech is clear. Cranial nerves appear to be grossly intact, with the exception of query faint right facial droop. She is able to wiggle both of her toes upon command. She has a fairly good  handgrip bilaterally. No anxiousness.  Extremities: No acute hot red joints. Bilateral  knee scars noted. Near resolution of bilateral lower extremity edema.  Skin: No rashes, lesions or ulcers.  Noted well-healed scars on both knees.  Psychiatry: Flat affect.    Data Reviewed: I have personally reviewed following labs and imaging studies  CBC:  Recent Labs Lab 12/27/16  0626  WBC 10.3  HGB 8.5*  HCT 25.8*  MCV 87.2  PLT 161   Basic Metabolic Panel:  Recent Labs Lab 12/24/16 0745 12/25/16 0527 12/26/16 0754 12/27/16 0626 12/28/16 0604  NA 143 143 144 141 140  K 3.9 3.7 3.4* 3.5 3.7  CL 111 111 108 103 101  CO2 21* 18* 24 27 29   GLUCOSE 117* 132* 118* 97 95  BUN 58* 59* 62* 64* 67*  CREATININE 5.48* 5.26* 5.65* 5.42* 5.34*  CALCIUM 8.2* 8.4* 8.3* 7.9* 8.1*  PHOS 5.2* 5.3* 5.8* 4.9* 5.1*   GFR: Estimated Creatinine Clearance: 10.5 mL/min (A) (by C-G formula based on SCr of 5.34 mg/dL (H)). Liver Function Tests:  Recent Labs Lab 12/24/16 0745 12/25/16 0527 12/26/16 0754 12/27/16 0626 12/28/16 0604  ALBUMIN 2.6* 2.9* 2.8* 2.6* 2.6*   No results for input(s): LIPASE, AMYLASE in the last 168  hours.  Recent Labs Lab 12/24/16 1353 12/26/16 1850  AMMONIA 20 19   Coagulation Profile: No results for input(s): INR, PROTIME in the last 168 hours. Cardiac Enzymes:  Recent Labs Lab 12/24/16 1353  CKTOTAL 35*   BNP (last 3 results) No results for input(s): PROBNP in the last 8760 hours. HbA1C: No results for input(s): HGBA1C in the last 72 hours. CBG:  Recent Labs Lab 12/27/16 0044  GLUCAP 101*   Lipid Profile: No results for input(s): CHOL, HDL, LDLCALC, TRIG, CHOLHDL, LDLDIRECT in the last 72 hours. Thyroid Function Tests: No results for input(s): TSH, T4TOTAL, FREET4, T3FREE, THYROIDAB in the last 72 hours. Anemia Panel: No results for input(s): VITAMINB12, FOLATE, FERRITIN, TIBC, IRON, RETICCTPCT in the last 72 hours. Sepsis Labs: No results for input(s): PROCALCITON, LATICACIDVEN in the last 168 hours.  No results found for this or any previous visit (from the past 240 hour(s)).       Radiology Studies: Dg Chest 1 View  Result Date: 12/26/2016 CLINICAL DATA:  Acute onset of shortness of breath. Acute renal insufficiency. Initial encounter. EXAM: CHEST 1 VIEW COMPARISON:  CTA of the chest performed 11/18/2016, and chest radiograph performed 08/25/2012 FINDINGS: The lungs are well-aerated. Mild vascular congestion is noted. Patchy bilateral airspace opacities, left greater than right, may reflect pneumonia. The appearance is less typical for asymmetric interstitial edema. Small bilateral pleural effusions are suggested. No pneumothorax is seen. A left PICC is noted ending about the proximal SVC. The cardiomediastinal silhouette is mildly enlarged. No acute osseous abnormalities are seen. IMPRESSION: Mild vascular congestion and mild cardiomegaly. Patchy bilateral airspace opacities, left greater than right, may reflect pneumonia. The appearance is less typical for asymmetric interstitial edema. Small bilateral pleural effusions suggested. Electronically Signed    By: Garald Balding M.D.   On: 12/26/2016 19:40   Ct Head Wo Contrast  Result Date: 12/26/2016 CLINICAL DATA:  Confusion.  Altered mental status. EXAM: CT HEAD WITHOUT CONTRAST TECHNIQUE: Contiguous axial images were obtained from the base of the skull through the vertex without intravenous contrast. COMPARISON:  None. FINDINGS: Brain: No evidence of acute infarction, hemorrhage, hydrocephalus, extra-axial collection or mass lesion/mass effect. There is white matter hypoattenuation, which is most evident in the posterior white matter of the parietal and occipital lobes. This is fairly symmetric. The ventricles and sulci are mildly enlarged consistent with mild diffuse atrophy. Vascular: No hyperdense vessel or unexpected calcification. Skull: Normal. Negative for fracture or focal lesion. Sinuses/Orbits: Globes and orbits are unremarkable. Visualized sinuses and mastoid air cells are clear. Other: None. IMPRESSION: 1. There is white matter hypoattenuation  that is prominent posteriorly. Although this is nonspecific, consider PRES. Followup brain MRI would be helpful to further characterize this appearance. 2. There is no evidence of a mass cysts, recent infarction or of intracranial hemorrhage. Electronically Signed   By: Lajean Manes M.D.   On: 12/26/2016 20:39        Scheduled Meds: . ALPRAZolam  0.25 mg Oral BID  . [START ON 12/29/2016] amLODipine  10 mg Oral Daily  . atenolol  50 mg Oral Daily  . furosemide  40 mg Intravenous BID  . gabapentin  100 mg Oral BID  . heparin  5,000 Units Subcutaneous Q8H  . vitamin B-12  500 mcg Oral Daily   Continuous Infusions: . 0.45 % NaCl with KCl 20 mEq / L 100 mL/hr at 12/28/16 1053     LOS: 13 days    Time spent: 30 minutes. Greater than 50% face-to-face time clinically.    Rexene Alberts, MD [facility] Pager [phone]  If 7PM-7AM, please contact night-coverage [email or web address] Password [email or web address] 12/28/2016, 11:10 AM

## 2016-12-29 LAB — RENAL FUNCTION PANEL
ALBUMIN: 2.6 g/dL — AB (ref 3.5–5.0)
ANION GAP: 12 (ref 5–15)
BUN: 70 mg/dL — ABNORMAL HIGH (ref 6–20)
CALCIUM: 8.3 mg/dL — AB (ref 8.9–10.3)
CHLORIDE: 99 mmol/L — AB (ref 101–111)
CO2: 29 mmol/L (ref 22–32)
CREATININE: 5.15 mg/dL — AB (ref 0.44–1.00)
GFR, EST AFRICAN AMERICAN: 9 mL/min — AB (ref >60)
GFR, EST NON AFRICAN AMERICAN: 7 mL/min — AB (ref >60)
Glucose, Bld: 84 mg/dL (ref 65–99)
POTASSIUM: 4 mmol/L (ref 3.5–5.1)
Phosphorus: 5.8 mg/dL — ABNORMAL HIGH (ref 2.5–4.6)
Sodium: 140 mmol/L (ref 135–145)

## 2016-12-29 NOTE — Progress Notes (Signed)
Pts BP 189/76, Hydralazine 5mg  IV given per PRN order. Will continue to monitor pt

## 2016-12-29 NOTE — Progress Notes (Signed)
Subjective: Interval History:  Patient says that she is feeling much better. She denies any nausea or vomiting. She denies also any difficulty breathing. Objective: Vital signs in last 24 hours: Temp:  [87.6 F (30.9 C)-97.7 F (36.5 C)] 97.7 F (36.5 C) (06/25 0631) Pulse Rate:  [61-64] 64 (06/24 2200) Resp:  [19] 19 (06/25 0631) BP: (160-175)/(53-76) 160/76 (06/25 0631) SpO2:  [95 %-98 %] 95 % (06/25 0631) Weight:  [95.1 kg (209 lb 9.6 oz)] 95.1 kg (209 lb 9.6 oz) (06/25 0631) Weight change: -2.126 kg (-4 lb 11 oz)  Intake/Output from previous day: 06/24 0701 - 06/25 0700 In: 2723.8 [P.O.:480; I.V.:2243.8] Out: 2600 [Urine:2600] Intake/Output this shift: No intake/output data recorded.  Generally:  Chest: Decreased breath sound otherwise seems to be clear Heart exam revealed regular rate and rhythm no murmur Extremities: She has trace  edema bilaterally  Lab Results:  Recent Labs  12/27/16 0626  WBC 10.3  HGB 8.5*  HCT 25.8*  PLT 212   BMET:   Recent Labs  12/28/16 0604 12/29/16 0606  NA 140 140  K 3.7 4.0  CL 101 99*  CO2 29 29  GLUCOSE 95 84  BUN 67* 70*  CREATININE 5.34* 5.15*  CALCIUM 8.1* 8.3*   No results for input(s): PTH in the last 72 hours. Iron Studies:  No results for input(s): IRON, TIBC, TRANSFERRIN, FERRITIN in the last 72 hours.  Studies/Results: No results found.  I have reviewed the patient's current medications.  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 She had about 2600 mL of urine output and remains on Lasix. Her renal function progressively improving. Patient is asymptomatic. Problem #2 anemia: Her hemoglobin is low but stable. Possibly secondary to chronic disease. Problem #3 hypertension: Her blood pressure remains high amlodipine was increased to 10 mg yesterday.. Problem #4 hypokalemia: Her  potassium is normal. She is on potassium supplement. Problem #5 history of infected prosthesis: Patient off antibiotics. She is afebrile and her white blood cell count is normal. Problem #6 bone and mineral  disorder: Her calcium is in range but her phosphorus is  high.  Problem #7 low CO2 possibly metabolic. Her CO2 is 29 and presently is not on sodium bicarbonate. Problem #8 history of confusion and agitation. Possibly from medication and as this moment she seems to be better. Plan: 1] will continue his present management. 2] we will check her renal panel in the morning 3] if patient creatinine continued to improve she can possibly discharged to rehabilitation center and continue follow-up off her blood work.  .      LOS: 14 days   Jesslynn Kruck S 12/29/2016,9:05 AM

## 2016-12-29 NOTE — Progress Notes (Signed)
PROGRESS NOTE  Laura Chambers:[id number] DOB: [birth year] DOA: 12/15/2016 PCP: Lelon Perla, MD  Brief History:  76 year old woman who presented from the [facility] on 12/15/16 for further evaluation of worsening acute kidney injury. Her history is significant for hypertension, hyperlipidemia, renal angiomyolipoma, and a left total knee arthroplasty in 2001. The patient had been suffering with worsening left knee pain for at least 6 months. Dr. Jean Rosenthal performed outpatient arthrocentesis on 11/10/2016. The cultures were negative, but cell count showed 28,750 WBC. Subsequent, the patient was admitted to the hospital from 11/11/2016 through 11/19/2016. During that hospitalization, the patient had excision of her left total knee arthroplasty with placement of antibiotic spacer on 11/14/2016. She was initially treated with vancomycin and Zosyn, but this was switched to cefepime on 11/17/2016 secondary to increasing serum creatinine. Intraoperative cultures were negative for growth. The patient was discharged to a skilled nursing facility to finish 6 weeks of antibiotics with the anticipated stop date on 12/26/2016. Routine blood work obtained on 12/15/2016 showed that the patient's serum creatinine had increased to 4.76. As result, the patient was admitted to the hospital for further workup. Nephrology was consulted to assist with management. Postoperatively the patient did require placement of a Foley catheter secondary to urinary retention. The urinary catheter remained in place for 3 weeks and was removed one week prior to this admission. However, due to the patient's AKI, a foley catheter was placed during this [facility] course has been complicated by the patient's encephalopathy, but it appears to be resolving.  As a result, Xanax and gabapentin were restarted. -Her creatinine peaked up to a high of 5.65, but now appears to be on the down slope -Her son longer  desires a renal biopsy for the patient. -Patient's blood pressure has progressively increased. BP meds have been added and titrated.   Assessment/Plan: Acute kidney injury. The patient's baseline creatinine was 0.8-1.1 during her hospitalization May 2018. It started trending up the first week in June 1.42>>>2.02>>>4.76>>>5.65 on admission. -Renal ultrasound ordered for evaluation and it was negative for hydronephrosis. -Nephrology consulted and the working diagnosis is interstitial nephritis from antibiotics versus ATN versus prerenal azotemia although interstitial nephritis was highly suspicious due to her 6 weeks of beta-lactam tx -Patient was started on IV fluids with subsequent IV Lasix added per nephrology -Urine eosinophils ordered but not collected -Her son had previously contemplated having the patient transferred to [facility] for renal biopsy, but he no longer desires this. Apparently, his mother voiced that she did not want a biopsy. - Although potential dialysis was a concern, Dr. Lowanda Foster does not believe she requires dialysis at this time. -remains nonoliguric  Metabolic acidosis. The patient's CO2 was low on admission, from acute kidney injury. -Oral sodium bicarbonate was started. Her CO2 has normalized and bicarb d/ced on 6/23  Essential hypertension. -Patient is treated chronically with atenolol. -Due to increasing uncontrolled blood pressure, atenolol was increased from 25>50 mg daily. Amlodipine was also added. -Amlodipine increased to 10 mg on 6/24. -When necessary IV hydralazine was ordered on 6/22 for SBP of greater than 180.  Acute metabolic encephalopathy.  Apparently, the patient became agitated and delirious necessitating the start of as needed Haldol several days ago. Ativan which was also given resulted in more confusion, therefore it was discontinued. -multifactorial due to prolonged half life of opioid due to AKI, ativan, with major driver likely due to her  renal failure -Pt had been  treated with Xanax at the SNF with good response. She had also been treated with gabapentin at the SNF, but it was not restarted on admission due to confusion -Her TSH was within normal limits. Her ammonia level was within normal limits. Her vitamin B12 was within normal limits, but on the low end of normal-not likely contributing to her encephalopathy-oral supplement started. -Following Dr. Maralyn Sago conversation with her son on 6/22, additional studies were ordered>>> repeat ammonia level 20; ABG completely normal; UA 6-30 WBC; CT head with white matter hypoattenuation posteriorly, but no mass-infarction-or hemorrhage. -Per her son, the patient had been on a course of Xanax which helped her "nerves". She had also been on gabapentin  -Scheduled small dosing of Xanax 0.25 mg twice a day and gabapentin at 100 mg twice a day (dose decreased due to AKI) started on 6/22. -mental status is improving with improving renal function   Abnormal head CT. -As above, head CT on 6/22 noted for white matter hypoattenuation posteriorly. -Due to the patient's resolving encephalopathy, will hold off on the brain MRI.  Fluid overload/pulmonary edema/Acute respiratory failure with hypoxia Chest x-ray on 6/22 revealed vascular congestion and patchy bilateral airspace opacities left greater than right "may reflect pneumonia" per radiology. -Chest CT on 11/18/16 revealed scattered groundglass opacities bilaterally. -Patient does not appear to have pneumonia clinically -CXR changes likely combo of pulmonary edema and chronic changes -2-D echo revealed an EF of 60-65%, Doppler parameters consistent with high ventricular filling pressure, and PA pressure of 44 mmHg. -etiology diastolic dysfunction in the setting of worsening renal function leading to pulm edema -The patient remains on IV Lasix and IV fluids per nephrology. She does not appear to be in respiratory distress. -Her I's/O's have  been negative.  Prosthetic joint infection. -Patient completed antibiotic course on 12/25/16 with cefepime which was 42 days from the operative excision.  Normocytic anemia. -hemoglobin was 9.9 on admission with a baseline range of 10-11 -drop in Hgb multifactorial including worsening renal function and possibly hemodilution with IV fluids. Doubt acute blood loss other than frequent venipunctures. -Her anemia panel revealed a normal total iron, elevated TIBC, and elevated ferritin of 457. Vitamin B-12 was within normal limits but on the lower end of normal-supplement started. -Continue to monitor.     Disposition Plan:   SNF 1-2 days if cleared by nephrology Family Communication:   Sister updated at bedside  Consultants:  nephrology  Code Status:  FULL   DVT Prophylaxis:  [place] Heparin    Procedures: As Listed in Progress Note Above  Antibiotics: None    Subjective: Patient denies fevers, chills, headache, chest pain, dyspnea, nausea, vomiting, diarrhea, abdominal pain, dysuria, hematuria, hematochezia, and melena.   Objective: Vitals:   12/28/16 1458 12/28/16 2200 12/29/16 0631 12/29/16 1407  BP: (!) 164/53 (!) 175/68 (!) 160/76 (!) 150/58  Pulse: 61 64  69  Resp: 19 19 19 18   Temp: (!) 87.6 F (30.9 C) 97.6 F (36.4 C) 97.7 F (36.5 C) 98.6 F (37 C)  TempSrc: Oral Oral Oral Oral  SpO2: 98% 98% 95% 97%  Weight:   95.1 kg (209 lb 9.6 oz)   Height:        Intake/Output Summary (Last 24 hours) at 12/29/16 1501 Last data filed at 12/29/16 0900  Gross per 24 hour  Intake          2483.75 ml  Output             2600 ml  Net          -  116.25 ml   Weight change: -2.126 kg (-4 lb 11 oz) Exam:   General:  Pt is alert, follows commands appropriately, not in acute distress  HEENT: No icterus, No thrush, No neck mass, [place]/AT  Cardiovascular: RRR, S1/S2, no rubs, no gallops  Respiratory:bibasilar crackles, no wheeze  Abdomen: Soft/+BS, non tender, non  distended, no guarding  Extremities: trace LE edema, No lymphangitis, No petechiae, No rashes, no synovitis   Data Reviewed: I have personally reviewed following labs and imaging studies Basic Metabolic Panel:  Recent Labs Lab 12/25/16 0527 12/26/16 0754 12/27/16 0626 12/28/16 0604 12/29/16 0606  NA 143 144 141 140 140  K 3.7 3.4* 3.5 3.7 4.0  CL 111 108 103 101 99*  CO2 18* 24 27 29 29   GLUCOSE 132* 118* 97 95 84  BUN 59* 62* 64* 67* 70*  CREATININE 5.26* 5.65* 5.42* 5.34* 5.15*  CALCIUM 8.4* 8.3* 7.9* 8.1* 8.3*  PHOS 5.3* 5.8* 4.9* 5.1* 5.8*   Liver Function Tests:  Recent Labs Lab 12/25/16 0527 12/26/16 0754 12/27/16 0626 12/28/16 0604 12/29/16 0606  ALBUMIN 2.9* 2.8* 2.6* 2.6* 2.6*   No results for input(s): LIPASE, AMYLASE in the last 168 hours.  Recent Labs Lab 12/24/16 1353 12/26/16 1850  AMMONIA 20 19   Coagulation Profile: No results for input(s): INR, PROTIME in the last 168 hours. CBC:  Recent Labs Lab 12/27/16 0626  WBC 10.3  HGB 8.5*  HCT 25.8*  MCV 87.2  PLT 212   Cardiac Enzymes:  Recent Labs Lab 12/24/16 1353  CKTOTAL 35*   BNP: Invalid input(s): POCBNP CBG:  Recent Labs Lab 12/27/16 0044  GLUCAP 101*   HbA1C: No results for input(s): HGBA1C in the last 72 hours. Urine analysis:    Component Value Date/Time   COLORURINE COLORLESS (A) 12/26/2016 1838   APPEARANCEUR CLEAR 12/26/2016 1838   LABSPEC 1.006 12/26/2016 1838   PHURINE 5.0 12/26/2016 1838   GLUCOSEU NEGATIVE 12/26/2016 1838   HGBUR SMALL (A) 12/26/2016 1838   BILIRUBINUR NEGATIVE 12/26/2016 1838   KETONESUR NEGATIVE 12/26/2016 1838   PROTEINUR 30 (A) 12/26/2016 1838   UROBILINOGEN 0.2 08/25/2012 1408   NITRITE NEGATIVE 12/26/2016 1838   LEUKOCYTESUR TRACE (A) 12/26/2016 1838   Sepsis Labs: @LABRCNTIP (procalcitonin:4,lacticidven:4) )No results found for this or any previous visit (from the past 240 hour(s)).   Scheduled Meds: . ALPRAZolam  0.25 mg  Oral BID  . amLODipine  10 mg Oral Daily  . atenolol  50 mg Oral Daily  . furosemide  40 mg Intravenous BID  . gabapentin  100 mg Oral BID  . heparin  5,000 Units Subcutaneous Q8H  . vitamin B-12  500 mcg Oral Daily   Continuous Infusions: . 0.45 % NaCl with KCl 20 mEq / L 125 mL/hr at 12/28/16 2115    Procedures/Studies: Dg Chest 1 View  Result Date: 12/26/2016 CLINICAL DATA:  Acute onset of shortness of breath. Acute renal insufficiency. Initial encounter. EXAM: CHEST 1 VIEW COMPARISON:  CTA of the chest performed 11/18/2016, and chest radiograph performed 08/25/2012 FINDINGS: The lungs are well-aerated. Mild vascular congestion is noted. Patchy bilateral airspace opacities, left greater than right, may reflect pneumonia. The appearance is less typical for asymmetric interstitial edema. Small bilateral pleural effusions are suggested. No pneumothorax is seen. A left PICC is noted ending about the proximal SVC. The cardiomediastinal silhouette is mildly enlarged. No acute osseous abnormalities are seen. IMPRESSION: Mild vascular congestion and mild cardiomegaly. Patchy bilateral airspace opacities, left greater than right, may  reflect pneumonia. The appearance is less typical for asymmetric interstitial edema. Small bilateral pleural effusions suggested. Electronically Signed   By: Garald Balding M.D.   On: 12/26/2016 19:40   Ct Head Wo Contrast  Result Date: 12/26/2016 CLINICAL DATA:  Confusion.  Altered mental status. EXAM: CT HEAD WITHOUT CONTRAST TECHNIQUE: Contiguous axial images were obtained from the base of the skull through the vertex without intravenous contrast. COMPARISON:  None. FINDINGS: Brain: No evidence of acute infarction, hemorrhage, hydrocephalus, extra-axial collection or mass lesion/mass effect. There is white matter hypoattenuation, which is most evident in the posterior white matter of the parietal and occipital lobes. This is fairly symmetric. The ventricles and sulci are  mildly enlarged consistent with mild diffuse atrophy. Vascular: No hyperdense vessel or unexpected calcification. Skull: Normal. Negative for fracture or focal lesion. Sinuses/Orbits: Globes and orbits are unremarkable. Visualized sinuses and mastoid air cells are clear. Other: None. IMPRESSION: 1. There is white matter hypoattenuation that is prominent posteriorly. Although this is nonspecific, consider PRES. Followup brain MRI would be helpful to further characterize this appearance. 2. There is no evidence of a mass cysts, recent infarction or of intracranial hemorrhage. Electronically Signed   By: Lajean Manes M.D.   On: 12/26/2016 20:39   US Renal  Result Date: 12/15/2016 CLINICAL DATA:  Renal failure EXAM: RENAL / URINARY TRACT ULTRASOUND COMPLETE COMPARISON:  None. FINDINGS: Right Kidney: Length: 11.2 cm. 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 No hydronephrosis. Left Kidney: Length: 11.6 cm.  No mass or hydronephrosis. Bladder: Underdistended. 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 No hydronephrosis. Electronically Signed   By: Julian Hy M.D.   On: 12/15/2016 14:10    Zamere Pasternak, DO  [facility] Pager [phone]  If 7PM-7AM, please contact night-coverage [email or web address] Password [email or web address] 12/29/2016, 3:01 PM   LOS: 14 days

## 2016-12-29 NOTE — Progress Notes (Signed)
Pt experienced a fall around 1500. Fall was witnessed by NT and RN. Pt was being transferred from bed to chair. NT was helping pt up and RN was going to move bed and put bed under the pt since she states she can't walk at all. At the moment the chair was going under the pt bottom, the pt fell on top of NT. Post fall, vitals stable, neuro checks WNL. MD notified at 1325. Will continue to monitor.

## 2016-12-29 NOTE — Progress Notes (Signed)
Pt did develop a small knot on the back of the head post fall. Ice applied, will continue to monitor.

## 2016-12-29 NOTE — Progress Notes (Signed)
Physical Therapy Treatment Patient Details Name: Laura Chambers MRN: [id number] DOB: June 03, 1941 Today's Date: 12/29/2016    History of Present Illness Emmelyn Schmale is a 76yo white female who comes to [facility] from [facility] due to declining kidney function, admitted with AKI. Pt has been at [facility] on IV ABX since hardware removal from remote Left TKA and placement of ABX spacer. Has been receiving rehab at [facility] as well to address mobility deficits. She remains Lt TTWB, and revision surgery has not been scheduled at this point. Prior to sepsis in May, pt was a Corporate investment banker adult, still working, AMB withotu restriction or AD, living alone. 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     PT Comments    Pt received up in chair with niece and sister present. Pt was agreeable to PT treatment. Pt was very pleasant and talkative throughout entire session.Pt had knee immobilizer on LLE when PT entered the room and PT reminded pt of WB precautions. Performed seated therex with mainly RLE, but did perform hip march and ankle pumps on the LLE as well (pt required AAROM for L ankle pumps because she verbalized that she had drop foot). Attempted sit <> stand with RW 5x from chair while maintaining WB precautions but pt unable to come to full upright position even with max A each time. Left pt sitting in chair with niece by her side and told pt to use her call bell when she wants to get up; pt and her niece verbalized understanding. Continue to recommend SNF upon d/c due to weakness and amount of assistance required for functional tasks.    Follow Up Recommendations  SNF     Equipment Recommendations  None recommended by PT    Recommendations for Other Services OT consult     Precautions / Restrictions Precautions Precautions: Fall;Knee Required Braces or Orthoses: Knee Immobilizer - Left (knee brace on when  OOB) Restrictions Weight Bearing Restrictions: Yes RUE Weight Bearing: Touch down weight bearing LUE Weight Bearing: Touch down weight bearing LLE Weight Bearing: Touchdown weight bearing    Mobility  Bed Mobility               General bed mobility comments: not assessed this date as pt received and left sitting in chair  Transfers Overall transfer level: Needs assistance Equipment used: Rolling walker (2 wheeled) Transfers: Sit to/from Stand Sit to Stand: Max assist         General transfer comment: even with max A, pt unable to attain full upright standing position from chair  Ambulation/Gait             General Gait Details: not ambulating at this time   Stairs            Wheelchair Mobility    Modified Rankin (Stroke Patients Only)       Balance                                            Cognition Arousal/Alertness: Awake/alert Behavior During Therapy: WFL for tasks assessed/performed Overall Cognitive Status: Within Functional Limits for tasks assessed                         Following Commands: Follows one step commands inconsistently  General Comments: Pt A&OX4 this date and had good eye contact throughout entire session. Pt was very pleasant and talkative with therapist throughout duration of session.      Exercises General Exercises - Lower Extremity Ankle Circles/Pumps: Both;20 reps;Limitations Ankle Circles/Pumps Limitations: AAROM with LLE Long Arc Quad: Right;20 reps Hip Flexion/Marching: Both;20 reps Other Exercises Other Exercises: attempted sit <> stand with RW from chair maintaining LLE TTWB status but pt unable to come to full upright standing position even with max A from therapist    General Comments        Pertinent Vitals/Pain Pain Assessment: No/denies pain    Home Living                      Prior Function            PT Goals (current goals can now be found in  the care plan section) Acute Rehab PT Goals Patient Stated Goal: return to home when able  PT Goal Formulation: With patient Time For Goal Achievement: 12/31/16 Potential to Achieve Goals: Good    Frequency    Min 2X/week      PT Plan      Co-evaluation              AM-PAC PT "6 Clicks" Daily Activity  Outcome Measure  Difficulty turning over in bed (including adjusting bedclothes, sheets and blankets)?: A Lot Difficulty moving from lying on back to sitting on the side of the bed? : A Lot Difficulty sitting down on and standing up from a chair with arms (e.g., wheelchair, bedside commode, etc,.)?: A Lot Help needed moving to and from a bed to chair (including a wheelchair)?: Total Help needed walking in hospital room?: Total Help needed climbing 3-5 steps with a railing? : Total 6 Click Score: 9    End of Session Equipment Utilized During Treatment: Gait belt Activity Tolerance: Patient tolerated treatment well;No increased pain Patient left: in chair;with family/visitor present;with call bell/phone within reach Nurse Communication: Mobility status (with RN and Dr. Carles Collet) PT Visit Diagnosis: Difficulty in walking, not elsewhere classified (R26.2);Muscle weakness (generalized) (M62.81)     Time: 9311-2162 PT Time Calculation (min) (ACUTE ONLY): 17 min  Charges:  $Therapeutic Exercise: 8-22 mins                    G Codes:        Geraldine Solar PT, DPT

## 2016-12-29 NOTE — Progress Notes (Addendum)
LCSW following for disposition: return to [facility].  Patient continues to need acute care and improving per notes/chart review. Patient will require updated PT notes and insurance auth for her to return to SNF. LCSW updated [facility] regarding status and treatment plan.  Insurance will keep authorization open for a couple more days in case patient discharges. If still here later in the week, auth will be discontinued and new auth will need to be opened.  Will continue to communicate with Santiago Glad at [facility].  Will continue to follow and assist with disposition  Lane Hacker, MSW Clinical Social Work: System Wide Float Coverage for :  [phone]

## 2016-12-30 ENCOUNTER — Inpatient Hospital Stay (HOSPITAL_COMMUNITY): Payer: PPO

## 2016-12-30 LAB — RENAL FUNCTION PANEL
ANION GAP: 14 (ref 5–15)
Albumin: 2.9 g/dL — ABNORMAL LOW (ref 3.5–5.0)
BUN: 69 mg/dL — ABNORMAL HIGH (ref 6–20)
CHLORIDE: 97 mmol/L — AB (ref 101–111)
CO2: 28 mmol/L (ref 22–32)
CREATININE: 5.07 mg/dL — AB (ref 0.44–1.00)
Calcium: 8.4 mg/dL — ABNORMAL LOW (ref 8.9–10.3)
GFR calc non Af Amer: 7 mL/min — ABNORMAL LOW (ref >60)
GFR, EST AFRICAN AMERICAN: 9 mL/min — AB (ref >60)
Glucose, Bld: 108 mg/dL — ABNORMAL HIGH (ref 65–99)
Phosphorus: 4.4 mg/dL (ref 2.5–4.6)
Potassium: 3.7 mmol/L (ref 3.5–5.1)
Sodium: 139 mmol/L (ref 135–145)

## 2016-12-30 NOTE — Progress Notes (Signed)
Subjective: Interval History:  Patient continued to feel better. She denies any nausea or vomiting. Appetite is getting better. Objective: Vital signs in last 24 hours: Temp:  [97.6 F (36.4 C)-98.7 F (37.1 C)] 97.6 F (36.4 C) (06/26 0535) Pulse Rate:  [69-89] 69 (06/26 0535) Resp:  [18-20] 20 (06/26 0535) BP: (150-189)/(58-76) 161/67 (06/26 0535) SpO2:  [97 %-98 %] 98 % (06/26 0535) Weight:  [94.3 kg (207 lb 14.4 oz)] 94.3 kg (207 lb 14.4 oz) (06/26 0500) Weight change: -0.771 kg (-1 lb 11.2 oz)  Intake/Output from previous day: 06/25 0701 - 06/26 0700 In: 120 [P.O.:120] Out: 2200 [Urine:2200] Intake/Output this shift: No intake/output data recorded.  Generally:  Chest: Decreased breath sound otherwise seems to be clear Heart exam revealed regular rate and rhythm no murmur Extremities: She has trace  edema bilaterally  Lab Results: No results for input(s): WBC, HGB, HCT, PLT in the last 72 hours. BMET:   Recent Labs  12/29/16 0606 12/30/16 0618  NA 140 139  K 4.0 3.7  CL 99* 97*  CO2 29 28  GLUCOSE 84 108*  BUN 70* 69*  CREATININE 5.15* 5.07*  CALCIUM 8.3* 8.4*   No results for input(s): PTH in the last 72 hours. Iron Studies:  No results for input(s): IRON, TIBC, TRANSFERRIN, FERRITIN in the last 72 hours.  Studies/Results: No results found.  I have reviewed the patient's current medications.  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 She had about 2200 mL of urine output and remains on Lasix. Her renal function progressively improving. Patient is asymptomatic. Presently 5.07. Problem #2 anemia: Her hemoglobin is low but stable. Possibly secondary to chronic disease. Problem #3 hypertension: Her blood pressure remains high amlodipine was increased to 10 mg yesterday.. Problem #4 hypokalemia: Her potassium is normal. She is on  potassium supplement. Problem #5 history of infected prosthesis: Patient off antibiotics. She is afebrile and her white blood cell count is normal. Problem #6 bone and mineral  disorder: Her calcium and phosphorous is arrange this time. Problem #7 low CO2 possibly metabolic. Has corrected. Problem #8 history of confusion and agitation. Patient has returned to her baseline. Plan: 1] will continue his present management. 2] we will check her renal panel in the morning   .      LOS: 15 days   Sartaj Hoskin S 12/30/2016,7:58 AM

## 2016-12-30 NOTE — Progress Notes (Signed)
PROGRESS NOTE  Laura Chambers AJG:[id number] DOB: 10-19-1940 DOA: 12/15/2016 PCP: Lelon Perla, MD Brief History:  76 year old woman who presented from the [facility] on 12/15/16 for further evaluation of worsening acute kidney injury. Her history is significant for hypertension, hyperlipidemia, renal angiomyolipoma, and a left total knee arthroplasty in 2001. The patient had been suffering with worsening left knee pain for at least 6 months. Dr. Jean Rosenthal performed outpatient arthrocentesis on 11/10/2016. The cultures were negative, but cell count showed 28,750 WBC. Subsequent, the patient was admitted to the hospital from 11/11/2016 through 11/19/2016. During that hospitalization, the patient had excision of her left total knee arthroplasty with placement of antibiotic spacer on 11/14/2016. She was initially treated with vancomycin and Zosyn, but this was switched to cefepime on 11/17/2016 secondary to increasing serum creatinine. Intraoperative cultures were negative for growth. The patient was discharged to a skilled nursing facility to finish 6 weeks of antibiotics with the anticipated stop date on 12/26/2016. Routine blood work obtained on 12/15/2016 showed that the patient's serum creatinine had increased to 4.76. As result, the patient was admitted to the hospital for further workup. Nephrology was consulted to assist with management. Postoperatively the patient did require placement of a Foley catheter secondary to urinary retention. The urinary catheter remained in place for 3 weeks and was removed one week prior to this admission. However, due to the patient's AKI, afoley catheter was placed during this [facility] course has been complicated by the patient's encephalopathy, but it appears to be resolving. As a result, Xanax and gabapentin were restarted. -Her creatinine peaked up to a high of 5.65, but now appears to be on the down slope -Her son longer  desires a renal biopsy for the patient. -Patient's blood pressure has progressively increased. BP meds have been added and titrated.   Assessment/Plan: Acute kidney injury. The patient's baseline creatinine was 0.8-1.1 during her hospitalization May 2018. It started trending up the first week in June 1.42>>>2.02>>>4.76>>>5.65 on admission. -Renal ultrasound ordered for evaluation and it was negative for hydronephrosis. 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 - Although potential dialysis was a concern, Dr. Lowanda Foster does not believe she requires dialysis at this time.  Metabolic acidosis. The patient's CO2 was low on admission, from acute kidney injury. -Oral sodium bicarbonate was started. Her CO2 has normalized and bicarb d/ced on 6/23  Essential hypertension. -Patient is treated chronically with atenolol. -Due to increasing uncontrolled blood pressure, atenolol was increased from 25>50 mg daily. -Amlodipine increased to 10 mg on 6/24. -When necessary IV hydralazine was ordered on 6/22 for SBP of greater than 180.  Acute metabolic encephalopathy.  -patient became agitated and delirious necessitating the start of as needed Haldol several days ago. Ativan which was also given resulted in more confusion, therefore it was discontinued. -multifactorial due to prolonged half life of opioid due to AKI, ativan, with major driver likely due to her renal failure -Pt had been treated with Xanax at the SNF with good  response. She had also  been treated with gabapentin at the SNF, but it was not restarted on admission due to confusion -Her TSH was within normal limits. Her ammonia level was within normal limits. Her vitamin B12 was within normal limits, but on the low end of normal-not likely contributing to her encephalopathy-oral supplement started. -Following Dr. Maralyn Sago conversation with her son on 6/22, additional studies were ordered>>> repeat ammonia level 20; ABG completely normal; UA 6-30 WBC; CT head with white matter hypoattenuation posteriorly, but no mass-infarction-or hemorrhage. -Per her son, the patient had been on a course of Xanax which helped her "nerves". She had also been on gabapentin  -Scheduled small dosing of Xanax 0.25 mg twice a day and gabapentin at 100 mg twice a day (dose decreased due to AKI)started on 6/22. -mental status is improving with improving renal function  Abnormal head CT. -As above, head CT on 6/22 noted for white matter hypoattenuation posteriorly. -Due to the patient's resolving encephalopathy, will hold off on the brain MRI.  Fluid overload/pulmonary edema/Acute respiratory failure with hypoxia -Chest x-ray on 6/22 revealed vascular congestion and patchy bilateral airspace opacities left greater than right -Chest CT on 11/18/16 revealed scattered groundglass opacities bilaterally. -Patient does not appear to have pneumonia clinically -12/30/16 CXR--personally reviewed--slight improved vasc congestion -2-D echo revealed an EF of 60-65%, Doppler parameters consistent with high ventricular filling pressure, and PA pressure of 44 mmHg.  -etiology diastolic--dysfunction in the setting of worsening renal function leading to pulm edema -The patient remains on IV Lasix and IV fluids per nephrology. She does not appear to be in respiratory distress. -Her I's/O's have been negative.  Prosthetic joint infection. -Patient completed antibiotic course on 12/25/16 with  cefepime which was 42 days from the operative excision.  Normocytic anemia. -hemoglobin was 9.9 on admission with a baseline range of 10-11 -drop in Hgb multifactorial including worsening renal function and possibly hemodilution with IV fluids. Doubt acute blood loss other than frequent venipunctures. -Her anemia panel revealed a normal total iron, elevated TIBC, and elevated ferritin of 457. Vitamin B-12 was within normal limits but on the lower end of normal-supplement started. -Continue to monitor.  Retracted PICC line -cannot draw blood but infusing fine -6/26 CXR to verify placement-->slightly retracted     Disposition Plan:   SNF 1-2 days if cleared by nephrology Family Communication:   Sister updated at bedside  Consultants:  nephrology  Code Status:  FULL   DVT Prophylaxis:  [place] Heparin    Procedures: As Listed in Progress Note Above  Antibiotics: None   Subjective: Patient had some nausea today but denied any emesis. She was able to tolerate her diet. Denies any fevers, chest, chest pain with rest but, vomiting, diarrhea, abdominal pain, dysuria, hematuria.  Objective: Vitals:   12/29/16 2100 12/30/16 0011 12/30/16 0500 12/30/16 0535  BP: (!) 189/76 (!) 160/61  (!) 161/67  Pulse: 71 74  69  Resp: 20   20  Temp: 98.4 F (36.9 C) 98.6 F (37 C)  97.6 F (36.4 C)  TempSrc: Oral Oral  Oral  SpO2: 97%   98%  Weight:   94.3 kg (207 lb 14.4 oz)   Height:        Intake/Output Summary (Last 24 hours) at 12/30/16 1656 Last data filed at 12/30/16 0569  Gross per 24 hour  Intake                0 ml  Output  2200 ml  Net            -2200 ml   Weight change: -0.771 kg (-1 lb 11.2 oz) Exam:   General:  Pt is alert, follows commands appropriately, not in acute distress  HEENT: No icterus, No thrush, No neck mass, [place]/AT  Cardiovascular: RRR, S1/S2, no rubs, no gallops  Respiratory: Fine bibasilar crackles. No wheezing. Good air  movement.  Abdomen: Soft/+BS, non tender, non distended, no guarding  Extremities: trace LE edema, No lymphangitis, No petechiae, No rashes, no synovitis   Data Reviewed: I have personally reviewed following labs and imaging studies Basic Metabolic Panel:  Recent Labs Lab 12/26/16 0754 12/27/16 0626 12/28/16 0604 12/29/16 0606 12/30/16 0618  NA 144 141 140 140 139  K 3.4* 3.5 3.7 4.0 3.7  CL 108 103 101 99* 97*  CO2 24 27 29 29 28   GLUCOSE 118* 97 95 84 108*  BUN 62* 64* 67* 70* 69*  CREATININE 5.65* 5.42* 5.34* 5.15* 5.07*  CALCIUM 8.3* 7.9* 8.1* 8.3* 8.4*  PHOS 5.8* 4.9* 5.1* 5.8* 4.4   Liver Function Tests:  Recent Labs Lab 12/26/16 0754 12/27/16 0626 12/28/16 0604 12/29/16 0606 12/30/16 0618  ALBUMIN 2.8* 2.6* 2.6* 2.6* 2.9*   No results for input(s): LIPASE, AMYLASE in the last 168 hours.  Recent Labs Lab 12/24/16 1353 12/26/16 1850  AMMONIA 20 19   Coagulation Profile: No results for input(s): INR, PROTIME in the last 168 hours. CBC:  Recent Labs Lab 12/27/16 0626  WBC 10.3  HGB 8.5*  HCT 25.8*  MCV 87.2  PLT 212   Cardiac Enzymes:  Recent Labs Lab 12/24/16 1353  CKTOTAL 35*   BNP: Invalid input(s): POCBNP CBG:  Recent Labs Lab 12/27/16 0044  GLUCAP 101*   HbA1C: No results for input(s): HGBA1C in the last 72 hours. Urine analysis:    Component Value Date/Time   COLORURINE COLORLESS (A) 12/26/2016 1838   APPEARANCEUR CLEAR 12/26/2016 1838   LABSPEC 1.006 12/26/2016 1838   PHURINE 5.0 12/26/2016 1838   GLUCOSEU NEGATIVE 12/26/2016 1838   HGBUR SMALL (A) 12/26/2016 1838   BILIRUBINUR NEGATIVE 12/26/2016 1838   KETONESUR NEGATIVE 12/26/2016 1838   PROTEINUR 30 (A) 12/26/2016 1838   UROBILINOGEN 0.2 08/25/2012 1408   NITRITE NEGATIVE 12/26/2016 1838   LEUKOCYTESUR TRACE (A) 12/26/2016 1838   Sepsis Labs: @LABRCNTIP (procalcitonin:4,lacticidven:4) )No results found for this or any previous visit (from the past 240  hour(s)).   Scheduled Meds: . ALPRAZolam  0.25 mg Oral BID  . amLODipine  10 mg Oral Daily  . atenolol  50 mg Oral Daily  . furosemide  40 mg Intravenous BID  . gabapentin  100 mg Oral BID  . heparin  5,000 Units Subcutaneous Q8H  . vitamin B-12  500 mcg Oral Daily   Continuous Infusions: . 0.45 % NaCl with KCl 20 mEq / L 125 mL/hr at 12/30/16 1500    Procedures/Studies: Dg Chest 1 View  Result Date: 12/26/2016 CLINICAL DATA:  Acute onset of shortness of breath. Acute renal insufficiency. Initial encounter. EXAM: CHEST 1 VIEW COMPARISON:  CTA of the chest performed 11/18/2016, and chest radiograph performed 08/25/2012 FINDINGS: The lungs are well-aerated. Mild vascular congestion is noted. Patchy bilateral airspace opacities, left greater than right, may reflect pneumonia. The appearance is less typical for asymmetric interstitial edema. Small bilateral pleural effusions are suggested. No pneumothorax is seen. A left PICC is noted ending about the proximal SVC. The cardiomediastinal silhouette is mildly enlarged. No acute  osseous abnormalities are seen. IMPRESSION: Mild vascular congestion and mild cardiomegaly. Patchy bilateral airspace opacities, left greater than right, may reflect pneumonia. The appearance is less typical for asymmetric interstitial edema. Small bilateral pleural effusions suggested. Electronically Signed   By: Garald Balding M.D.   On: 12/26/2016 19:40   Ct Head Wo Contrast  Result Date: 12/26/2016 CLINICAL DATA:  Confusion.  Altered mental status. EXAM: CT HEAD WITHOUT CONTRAST TECHNIQUE: Contiguous axial images were obtained from the base of the skull through the vertex without intravenous contrast. COMPARISON:  None. FINDINGS: Brain: No evidence of acute infarction, hemorrhage, hydrocephalus, extra-axial collection or mass lesion/mass effect. There is white matter hypoattenuation, which is most evident in the posterior white matter of the parietal and occipital lobes.  This is fairly symmetric. The ventricles and sulci are mildly enlarged consistent with mild diffuse atrophy. Vascular: No hyperdense vessel or unexpected calcification. Skull: Normal. Negative for fracture or focal lesion. Sinuses/Orbits: Globes and orbits are unremarkable. Visualized sinuses and mastoid air cells are clear. Other: None. IMPRESSION: 1. There is white matter hypoattenuation that is prominent posteriorly. Although this is nonspecific, consider PRES. Followup brain MRI would be helpful to further characterize this appearance. 2. There is no evidence of a mass cysts, recent infarction or of intracranial hemorrhage. Electronically Signed   By: Lajean Manes M.D.   On: 12/26/2016 20:39   US Renal  Result Date: 12/15/2016 CLINICAL DATA:  Renal failure EXAM: RENAL / URINARY TRACT ULTRASOUND COMPLETE COMPARISON:  None. FINDINGS: Right Kidney: Length: 11.2 cm. 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 No hydronephrosis. Left Kidney: Length: 11.6 cm.  No mass or hydronephrosis. Bladder: Underdistended. 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 No hydronephrosis. Electronically Signed   By: Julian Hy M.D.   On: 12/15/2016 14:10   Dg Chest Port 1 View  Result Date: 12/30/2016 CLINICAL DATA:  PICC line placement. EXAM: PORTABLE CHEST 1 VIEW COMPARISON:  12/26/2016 and prior exams FINDINGS: Cardiomegaly and pulmonary vascular congestion again noted. Left basilar atelectasis/ scarring again noted. A left PICC line has been slightly retracted with tip now overlying the medial left subclavian vein. There is no evidence of pneumothorax. IMPRESSION: Left PICC line slightly retracted now with tip overlying the medial left subclavian vein. Slightly decreased pulmonary vascular congestion and improved bibasilar aeration. Electronically Signed   By: Margarette Canada M.D.   On: 12/30/2016 16:25    Paiton Boultinghouse, DO  [facility] Pager [phone]  If 7PM-7AM, please contact  night-coverage [email or web address] Password [email or web address] 12/30/2016, 4:56 PM   LOS: 15 days

## 2016-12-30 NOTE — Progress Notes (Signed)
Pts PICC line not receiving any blood return. PICC line looks like it has came out some. Will inform day nurse to let MD know in case xray for placement is needed.

## 2016-12-31 ENCOUNTER — Encounter: Payer: Self-pay | Admitting: Internal Medicine

## 2016-12-31 DIAGNOSIS — G9340 Encephalopathy, unspecified: Secondary | ICD-10-CM

## 2016-12-31 DIAGNOSIS — I10 Essential (primary) hypertension: Secondary | ICD-10-CM

## 2016-12-31 DIAGNOSIS — N179 Acute kidney failure, unspecified: Secondary | ICD-10-CM

## 2016-12-31 DIAGNOSIS — T8459XS Infection and inflammatory reaction due to other internal joint prosthesis, sequela: Secondary | ICD-10-CM

## 2016-12-31 DIAGNOSIS — Z96659 Presence of unspecified artificial knee joint: Secondary | ICD-10-CM

## 2016-12-31 DIAGNOSIS — D649 Anemia, unspecified: Secondary | ICD-10-CM

## 2016-12-31 LAB — RENAL FUNCTION PANEL
ALBUMIN: 2.8 g/dL — AB (ref 3.5–5.0)
ANION GAP: 9 (ref 5–15)
BUN: 67 mg/dL — AB (ref 6–20)
CALCIUM: 8.3 mg/dL — AB (ref 8.9–10.3)
CO2: 30 mmol/L (ref 22–32)
CREATININE: 5.03 mg/dL — AB (ref 0.44–1.00)
Chloride: 98 mmol/L — ABNORMAL LOW (ref 101–111)
GFR calc Af Amer: 9 mL/min — ABNORMAL LOW (ref >60)
GFR calc non Af Amer: 8 mL/min — ABNORMAL LOW (ref >60)
GLUCOSE: 92 mg/dL (ref 65–99)
PHOSPHORUS: 4.2 mg/dL (ref 2.5–4.6)
Potassium: 4 mmol/L (ref 3.5–5.1)
SODIUM: 137 mmol/L (ref 135–145)

## 2016-12-31 LAB — CBC
HCT: 31.5 % — ABNORMAL LOW (ref 36.0–46.0)
HEMOGLOBIN: 10.1 g/dL — AB (ref 12.0–15.0)
MCH: 28.3 pg (ref 26.0–34.0)
MCHC: 32.1 g/dL (ref 30.0–36.0)
MCV: 88.2 fL (ref 78.0–100.0)
PLATELETS: 243 10*3/uL (ref 150–400)
RBC: 3.57 MIL/uL — ABNORMAL LOW (ref 3.87–5.11)
RDW: 16.4 % — AB (ref 11.5–15.5)
WBC: 8 10*3/uL (ref 4.0–10.5)

## 2016-12-31 NOTE — Progress Notes (Signed)
Subjective: Interval History:  Patient feels much better. She had episode of nausea but no vomiting. Her appetite is getting better Objective: Vital signs in last 24 hours: Temp:  [97.8 F (36.6 C)-98.4 F (36.9 C)] 97.8 F (36.6 C) (06/27 0500) Pulse Rate:  [68-76] 68 (06/27 0500) Resp:  [18-20] 20 (06/27 0500) BP: (138-150)/(61-68) 138/61 (06/27 0500) SpO2:  [92 %-95 %] 95 % (06/27 0500) Weight:  [93.5 kg (206 lb 3.2 oz)] 93.5 kg (206 lb 3.2 oz) (06/27 0500) Weight change: -0.771 kg (-1 lb 11.2 oz)  Intake/Output from previous day: 06/26 0701 - 06/27 0700 In: 6271.3 [P.O.:240; I.V.:6031.3] Out: 1550 [Urine:1550] Intake/Output this shift: No intake/output data recorded.  Generally:  Chest: Decreased breath sound otherwise seems to be clear Heart exam revealed regular rate and rhythm no murmur Extremities: She has trace  edema bilaterally  Lab Results:  Recent Labs  12/31/16 0548  WBC 8.0  HGB 10.1*  HCT 31.5*  PLT 243   BMET:   Recent Labs  12/30/16 0618 12/31/16 0550  NA 139 137  K 3.7 4.0  CL 97* 98*  CO2 28 30  GLUCOSE 108* 92  BUN 69* 67*  CREATININE 5.07* 5.03*  CALCIUM 8.4* 8.3*   No results for input(s): PTH in the last 72 hours. Iron Studies:  No results for input(s): IRON, TIBC, TRANSFERRIN, FERRITIN in the last 72 hours.  Studies/Results: Dg Chest Port 1 View  Result Date: 12/30/2016 CLINICAL DATA:  PICC line placement. EXAM: PORTABLE CHEST 1 VIEW COMPARISON:  12/26/2016 and prior exams FINDINGS: Cardiomegaly and pulmonary vascular congestion again noted. Left basilar atelectasis/ scarring again noted. A left PICC line has been slightly retracted with tip now overlying the medial left subclavian vein. There is no evidence of pneumothorax. IMPRESSION: Left PICC line slightly retracted now with tip overlying the medial left subclavian vein. Slightly decreased pulmonary vascular congestion and improved bibasilar aeration. Electronically Signed   By:  Margarette Canada M.D.   On: 12/30/2016 16:25    I have reviewed the patient's current medications.  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 She had about 1500 mL of urine output and remains on Lasix. Her renal function progressively improving.  Presently 5.03. She does not have uremic sing and symptoms Problem #2 anemia: Her hemoglobin is low but stable. Possibly secondary to chronic disease. Problem #3 hypertension: Her blood pressure is reasonably controlled Problem #4 hypokalemia: Her potassium is normal. She is on potassium supplement. Problem #5 history of infected prosthesis: Patient off antibiotics. She is afebrile and her white blood cell count is normal. Problem #6 bone and mineral  disorder: Her calcium and phosphorous is arrange this time. Problem #7 metabolic acidosis has corrected. Problem #8 history of confusion and agitation. Patient has returned to her baseline. Plan: 1] will D/C folly catheter. If patient continued with out requiring reinsertion of her folly catheter possibly can go back to rehab center 2] we will check her renal panel in the morning. If discharged check renal panel every other day for about week and I will see patient as out patient in about 1 week period   .      LOS: 16 days   Desire Fulp S 12/31/2016,7:54 AM

## 2016-12-31 NOTE — Progress Notes (Signed)
PROGRESS NOTE  Laura Chambers AQT:[id number] DOB: 1941/01/06 DOA: 12/15/2016 PCP: Lelon Perla, MD Brief History:  76 year old woman who presented from the [facility] on 12/15/16 for further evaluation of worsening acute kidney injury. Her history is significant for hypertension, hyperlipidemia, renal angiomyolipoma, and a left total knee arthroplasty in 2001. The patient had been suffering with worsening left knee pain for at least 6 months. Dr. Jean Rosenthal performed outpatient arthrocentesis on 11/10/2016. The cultures were negative, but cell count showed 28,750 WBC. Subsequent, the patient was admitted to the hospital from 11/11/2016 through 11/19/2016. During that hospitalization, the patient had excision of her left total knee arthroplasty with placement of antibiotic spacer on 11/14/2016. She was initially treated with vancomycin and Zosyn, but this was switched to cefepime on 11/17/2016 secondary to increasing serum creatinine. Intraoperative cultures were negative for growth. The patient was discharged to a skilled nursing facility to finish 6 weeks of antibiotics with the anticipated stop date on 12/26/2016. Routine blood work obtained on 12/15/2016 showed that the patient's serum creatinine had increased to 4.76. As result, the patient was admitted to the hospital for further workup. Nephrology was consulted to assist with management. Postoperatively the patient did require placement of a Foley catheter secondary to urinary retention. The urinary catheter remained in place for 3 weeks and was removed one week prior to this admission. However, due to the patient's AKI, afoley catheter was placed during this [facility] course has been complicated by the patient's encephalopathy, but it appears to be resolving. As a result, Xanax and gabapentin were restarted. -Her creatinine peaked up to a high of 5.65, but now appears to be on the down slope -Her son longer  desires a renal biopsy for the patient. -Patient's blood pressure has progressively increased. BP meds have been added and titrated.   Assessment/Plan: Acute kidney injury. The patient's baseline creatinine was 0.8-1.1 during her hospitalization May 2018. It started trending up the first week in June 1.42>>>2.02>>>4.76>>>5.65 on admission. -Renal ultrasound ordered for evaluation and it was negative for hydronephrosis. -Nephrology consulted and the working diagnosis is interstitial nephritis from antibiotics versus ATN versus prerenal azotemia although interstitial nephritis was highly suspicious due to her 6 weeks of beta-lactam tx -Patient was started on IV fluids with subsequent IV Lasix added per nephrology -Urine eosinophils ordered but not collected -Her son had previously contemplated having the patient transferred to [facility] for renal biopsy, but he no longer desires this.Apparently, his mother voiced that she did not want a biopsy. - Although potential dialysis was a concern, Dr. Lowanda Foster does not believe she requires dialysis at this time.  Metabolic acidosis. The patient's CO2 was low on admission, from acute kidney injury. -Oral sodium bicarbonate was started. Her CO2 has normalized and bicarb d/ced on 6/23  Essential hypertension. -Patient is treated chronically with atenolol. -Due to increasing uncontrolled blood pressure, atenolol was increased from 25>50 mg daily. -Amlodipine increased to 10 mg on 6/24. -When necessary IV hydralazine was ordered on 6/22 for SBP of greater than 180.  Acute metabolic encephalopathy.  -patient became agitated and delirious necessitating the start of as needed Haldol several days ago. Ativan which was also given resulted in more confusion, therefore it was discontinued. -multifactorial due to prolonged half life of opioid due to AKI, ativan, with major driver likely due to her renal failure -Pt had been treated with Xanax at the SNF with good  response. She had also  been treated with gabapentin at the SNF, but it was not restarted on admission due to confusion -Her TSH was within normal limits. Her ammonia level was within normal limits. Her vitamin B12 was within normal limits, but on the low end of normal-not likely contributing to her encephalopathy-oral supplement started. -Following Dr. Maralyn Sago conversation with her son on 6/22, additional studies were ordered>>> repeat ammonia level 20; ABG completely normal; UA 6-30 WBC; CT head with white matter hypoattenuation posteriorly, but no mass-infarction-or hemorrhage. -Per her son, the patient had been on a course of Xanax which helped her "nerves". She had also been on gabapentin  -Scheduled small dosing of Xanax 0.25 mg twice a day and gabapentin at 100 mg twice a day (dose decreased due to AKI)started on 6/22. -mental status is improving with improving renal function  Abnormal head CT. -As above, head CT on 6/22 noted for white matter hypoattenuation posteriorly. -Due to the patient's resolving encephalopathy, will hold off on the brain MRI.  Fluid overload/pulmonary edema/Acute respiratory failure with hypoxia -Chest x-ray on 6/22 revealed vascular congestion and patchy bilateral airspace opacities left greater than right -Chest CT on 11/18/16 revealed scattered groundglass opacities bilaterally. -Patient does not appear to have pneumonia clinically -12/30/16 CXR--personally reviewed--slight improved vasc congestion -2-D echo revealed an EF of 60-65%, Doppler parameters consistent with high ventricular filling pressure, and PA pressure of 44 mmHg.  -etiology diastolic--dysfunction in the setting of worsening renal function leading to pulm edema -The patient remains on IV Lasix and IV fluids per nephrology. She does not appear to be in respiratory distress. -Her intake and output has been less accurate since Foley catheter has been removed.  Prosthetic joint  infection. -Patient completed antibiotic course on 12/25/16 with cefepime which was 42 days from the operative excision. -Follow-up with orthopedics  Normocytic anemia. -hemoglobin was 9.9 on admission with a baseline range of 10-11 -drop in Hgb multifactorial including worsening renal function and possibly hemodilution with IV fluids. Doubt acute blood loss other than frequent venipunctures. -Her anemia panel revealed a normal total iron, elevated TIBC, and elevated ferritin of 457. Vitamin B-12 was within normal limits but on the lower end of normal-supplement started. -Continue to monitor.  Retracted PICC line -cannot draw blood but infusing fine -6/26 CXR to verify placement-->slightly retracted -We'll remove prior to discharge     Disposition Plan:   SNF, likely in a.m. Family Communication:    no family present  Consultants:  nephrology  Code Status:  FULL   DVT Prophylaxis:  [place] Heparin    Procedures: As Listed in Progress Note Above  Antibiotics: None   Subjective: Denies any pain. She is awake and alert. Denies any shortness of breath.  Objective: Vitals:   12/30/16 2018 12/30/16 2100 12/31/16 0500 12/31/16 1400  BP:  (!) 150/68 138/61 (!) 160/64  Pulse:  76 68 66  Resp:  18 20 19   Temp:  98.4 F (36.9 C) 97.8 F (36.6 C) 97.8 F (36.6 C)  TempSrc:  Oral Oral Oral  SpO2: 92% 92% 95% 98%  Weight:   93.5 kg (206 lb 3.2 oz)   Height:        Intake/Output Summary (Last 24 hours) at 12/31/16 1725 Last data filed at 12/31/16 1400  Gross per 24 hour  Intake             8455 ml  Output             1751 ml  Net  6704 ml   Weight change: -0.771 kg (-1 lb 11.2 oz) Exam:  General exam: Alert, awake, oriented x 3 Respiratory system: Clear to auscultation. Respiratory effort normal. Cardiovascular system:RRR. No murmurs, rubs, gallops. Gastrointestinal system: Abdomen is nondistended, soft and nontender. No organomegaly or masses felt.  Normal bowel sounds heard. Central nervous system: Alert and oriented. No focal neurological deficits. Extremities: No C/C/E, +pedal pulses Skin: No rashes, lesions or ulcers  Psychiatry: Judgement and insight appear normal. Mood & affect appropriate.    Data Reviewed: I have personally reviewed following labs and imaging studies Basic Metabolic Panel:  Recent Labs Lab 12/27/16 0626 12/28/16 0604 12/29/16 0606 12/30/16 0618 12/31/16 0550  NA 141 140 140 139 137  K 3.5 3.7 4.0 3.7 4.0  CL 103 101 99* 97* 98*  CO2 27 29 29 28 30   GLUCOSE 97 95 84 108* 92  BUN 64* 67* 70* 69* 67*  CREATININE 5.42* 5.34* 5.15* 5.07* 5.03*  CALCIUM 7.9* 8.1* 8.3* 8.4* 8.3*  PHOS 4.9* 5.1* 5.8* 4.4 4.2   Liver Function Tests:  Recent Labs Lab 12/27/16 0626 12/28/16 0604 12/29/16 0606 12/30/16 0618 12/31/16 0550  ALBUMIN 2.6* 2.6* 2.6* 2.9* 2.8*   No results for input(s): LIPASE, AMYLASE in the last 168 hours.  Recent Labs Lab 12/26/16 1850  AMMONIA 19   Coagulation Profile: No results for input(s): INR, PROTIME in the last 168 hours. CBC:  Recent Labs Lab 12/27/16 0626 12/31/16 0548  WBC 10.3 8.0  HGB 8.5* 10.1*  HCT 25.8* 31.5*  MCV 87.2 88.2  PLT 212 243   Cardiac Enzymes: No results for input(s): CKTOTAL, CKMB, CKMBINDEX, TROPONINI in the last 168 hours. BNP: Invalid input(s): POCBNP CBG:  Recent Labs Lab 12/27/16 0044  GLUCAP 101*   HbA1C: No results for input(s): HGBA1C in the last 72 hours. Urine analysis:    Component Value Date/Time   COLORURINE COLORLESS (A) 12/26/2016 1838   APPEARANCEUR CLEAR 12/26/2016 1838   LABSPEC 1.006 12/26/2016 1838   PHURINE 5.0 12/26/2016 1838   GLUCOSEU NEGATIVE 12/26/2016 1838   HGBUR SMALL (A) 12/26/2016 1838   BILIRUBINUR NEGATIVE 12/26/2016 1838   KETONESUR NEGATIVE 12/26/2016 1838   PROTEINUR 30 (A) 12/26/2016 1838   UROBILINOGEN 0.2 08/25/2012 1408   NITRITE NEGATIVE 12/26/2016 1838   LEUKOCYTESUR TRACE (A)  12/26/2016 1838   Sepsis Labs: @LABRCNTIP (procalcitonin:4,lacticidven:4) )No results found for this or any previous visit (from the past 240 hour(s)).   Scheduled Meds: . ALPRAZolam  0.25 mg Oral BID  . amLODipine  10 mg Oral Daily  . atenolol  50 mg Oral Daily  . furosemide  40 mg Intravenous BID  . gabapentin  100 mg Oral BID  . heparin  5,000 Units Subcutaneous Q8H  . vitamin B-12  500 mcg Oral Daily   Continuous Infusions: . 0.45 % NaCl with KCl 20 mEq / L 125 mL/hr at 12/31/16 1501    Procedures/Studies: Dg Chest 1 View  Result Date: 12/26/2016 CLINICAL DATA:  Acute onset of shortness of breath. Acute renal insufficiency. Initial encounter. EXAM: CHEST 1 VIEW COMPARISON:  CTA of the chest performed 11/18/2016, and chest radiograph performed 08/25/2012 FINDINGS: The lungs are well-aerated. Mild vascular congestion is noted. Patchy bilateral airspace opacities, left greater than right, may reflect pneumonia. The appearance is less typical for asymmetric interstitial edema. Small bilateral pleural effusions are suggested. No pneumothorax is seen. A left PICC is noted ending about the proximal SVC. The cardiomediastinal silhouette is mildly enlarged. No acute osseous  abnormalities are seen. IMPRESSION: Mild vascular congestion and mild cardiomegaly. Patchy bilateral airspace opacities, left greater than right, may reflect pneumonia. The appearance is less typical for asymmetric interstitial edema. Small bilateral pleural effusions suggested. Electronically Signed   By: Garald Balding M.D.   On: 12/26/2016 19:40   Ct Head Wo Contrast  Result Date: 12/26/2016 CLINICAL DATA:  Confusion.  Altered mental status. EXAM: CT HEAD WITHOUT CONTRAST TECHNIQUE: Contiguous axial images were obtained from the base of the skull through the vertex without intravenous contrast. COMPARISON:  None. FINDINGS: Brain: No evidence of acute infarction, hemorrhage, hydrocephalus, extra-axial collection or mass  lesion/mass effect. There is white matter hypoattenuation, which is most evident in the posterior white matter of the parietal and occipital lobes. This is fairly symmetric. The ventricles and sulci are mildly enlarged consistent with mild diffuse atrophy. Vascular: No hyperdense vessel or unexpected calcification. Skull: Normal. Negative for fracture or focal lesion. Sinuses/Orbits: Globes and orbits are unremarkable. Visualized sinuses and mastoid air cells are clear. Other: None. IMPRESSION: 1. There is white matter hypoattenuation that is prominent posteriorly. Although this is nonspecific, consider PRES. Followup brain MRI would be helpful to further characterize this appearance. 2. There is no evidence of a mass cysts, recent infarction or of intracranial hemorrhage. Electronically Signed   By: Lajean Manes M.D.   On: 12/26/2016 20:39   US Renal  Result Date: 12/15/2016 CLINICAL DATA:  Renal failure EXAM: RENAL / URINARY TRACT ULTRASOUND COMPLETE COMPARISON:  None. FINDINGS: Right Kidney: Length: 11.2 cm. 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 No hydronephrosis. Left Kidney: Length: 11.6 cm.  No mass or hydronephrosis. Bladder: Underdistended. 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 No hydronephrosis. Electronically Signed   By: Julian Hy M.D.   On: 12/15/2016 14:10   Dg Chest Port 1 View  Result Date: 12/30/2016 CLINICAL DATA:  PICC line placement. EXAM: PORTABLE CHEST 1 VIEW COMPARISON:  12/26/2016 and prior exams FINDINGS: Cardiomegaly and pulmonary vascular congestion again noted. Left basilar atelectasis/ scarring again noted. A left PICC line has been slightly retracted with tip now overlying the medial left subclavian vein. There is no evidence of pneumothorax. IMPRESSION: Left PICC line slightly retracted now with tip overlying the medial left subclavian vein. Slightly decreased pulmonary vascular congestion and improved bibasilar aeration. Electronically Signed    By: Margarette Canada M.D.   On: 12/30/2016 16:25    Gunnard Dorrance, DO  [facility] Pager [phone]  If 7PM-7AM, please contact night-coverage [email or web address] Password [email or web address] 12/31/2016, 5:25 PM   LOS: 16 days

## 2016-12-31 NOTE — Plan of Care (Signed)
Problem: Nutrition: Goal: Adequate nutrition will be maintained Outcome: Progressing Pt has had a couple complaints of nausea with no vomiting. Given Phenergen but pt trying to get OOB to get a honey bun. Pt eating more than 50% of meals. Will continue to monitor pt.

## 2016-12-31 NOTE — Clinical Social Work Note (Signed)
LCSW updated insurance company of patient's status. Insurance company stated that patient had an authorization in place for a week that would be closed out.  Insurance company advised LCSW to call back when patient had a discharge date and they will restart authorization.     Shaundra Fullam, Clydene Pugh, LCSW

## 2016-12-31 NOTE — Progress Notes (Signed)
Physical Therapy Treatment Patient Details Name: CAMILIA CAYWOOD MRN: [id number] DOB: 1941-02-06 Today's Date: 12/31/2016    History of Present Illness Kelce Bouton is a 76yo white female who comes to [facility] from [facility] due to declining kidney function, admitted with AKI. Pt has been at [facility] on IV ABX since hardware removal from remote Left TKA and placement of ABX spacer. Has been receiving rehab at [facility] as well to address mobility deficits. She remains Lt TTWB, and revision surgery has not been scheduled at this point. Prior to sepsis in May, pt was a Corporate investment banker adult, still working, AMB withotu restriction or AD, living alone. 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     PT Comments    Pt progressing well this session, multi sets of transfers performed in a variety of settings, with improved strength since last session, but pt continues to give out after 3 reps. Pt abel to tolerate self propulsion in WC today up to 170ft. LE fatigue noted with bed level exercise. Pt in good spirits and making progress toward goals overall. Transfers still require min-maxA and pivoting remains very limited and unsafe without heavy assistance.    Follow Up Recommendations  SNF     Equipment Recommendations  None recommended by PT    Recommendations for Other Services OT consult     Precautions / Restrictions Precautions Precautions: Fall;Knee Required Braces or Orthoses: Knee Immobilizer - Left Knee Immobilizer - Left: On when out of bed or walking Restrictions LLE Weight Bearing: Touchdown weight bearing    Mobility  Bed Mobility Overal bed mobility: Modified Independent Bed Mobility: Supine to Sit     Supine to sit: Modified independent (Device/Increase time)        Transfers Overall transfer level: Needs assistance Equipment used: Rolling walker (2 wheeled);1 person hand held  assist Transfers: Sit to/from [facility] Sit to Stand: Mod assist;From elevated surface Stand pivot transfers: Max assist;From elevated surface       General transfer comment: Improved self efficiacy with corrections in foot/trunk position, but gives out after 3 reps each set.   Ambulation/Gait             General Gait Details: not ambulating at this time   Hotel manager mobility: Yes Wheelchair propulsion: Both upper extremities;Right lower extremity Wheelchair parts: Other (comment) (PT bringing IV pole ) Distance: 100' @0 .55m/s   Modified Rankin (Stroke Patients Only)       Balance           Standing balance support: Bilateral upper extremity supported;During functional activity Standing balance-Leahy Scale: Fair Standing balance comment: RLE gives out quickly, buckles                            Cognition Arousal/Alertness: Awake/alert Behavior During Therapy: WFL for tasks assessed/performed Overall Cognitive Status: Within Functional Limits for tasks assessed                                 General Comments: Pt A&OX4 this date. Pt was very pleasant and talkative with therapist throughout duration of session.      Exercises General Exercises - Lower Extremity Hip ABduction/ADduction: AROM;Right;10 reps;Supine Straight Leg Raises: AROM;Right;10 reps;Supine Other Exercises Other  Exercises: STS dependent transfers from elevated surface, LLE TTWB, ModA x3  Other Exercises: STS transfers c RW from elevated surface, LLE TTWB, MinA 2x3  Other Exercises: Stand pivot transfer EOB to/from WC, maxA: x2    General Comments        Pertinent Vitals/Pain Pain Assessment: No/denies pain    Home Living                      Prior Function            PT Goals (current goals can now be found in the care plan section) Acute Rehab PT Goals Patient  Stated Goal: Return to independnet transfers and WC self propulsion PT Goal Formulation: With patient Time For Goal Achievement: 01/07/17 Potential to Achieve Goals: Fair Progress towards PT goals: Progressing toward goals;Goals downgraded-see care plan    Frequency    Min 2X/week      PT Plan Current plan remains appropriate    Co-evaluation              AM-PAC PT "6 Clicks" Daily Activity  Outcome Measure  Difficulty turning over in bed (including adjusting bedclothes, sheets and blankets)?: A Little Difficulty moving from lying on back to sitting on the side of the bed? : A Little Difficulty sitting down on and standing up from a chair with arms (e.g., wheelchair, bedside commode, etc,.)?: Total Help needed moving to and from a bed to chair (including a wheelchair)?: Total Help needed walking in hospital room?: Total Help needed climbing 3-5 steps with a railing? : Total 6 Click Score: 10    End of Session Equipment Utilized During Treatment: Gait belt Activity Tolerance: Patient tolerated treatment well;No increased pain;Patient limited by fatigue Patient left: with family/visitor present;with call bell/phone within reach;in bed (EOB eating dinner ) Nurse Communication: Mobility status PT Visit Diagnosis: Difficulty in walking, not elsewhere classified (R26.2);Muscle weakness (generalized) (M62.81)     Time: 0160-1093 PT Time Calculation (min) (ACUTE ONLY): 45 min  Charges:  $Therapeutic Activity: 23-37 mins $Wheel Chair Management: 8-22 mins                    G Codes:      5:27 PM, Jan 22, 2017 [name], PT, DPT Physical Therapist - [place] [phone] (Office)    Buccola,Allan C 2017/01/22, 5:23 PM

## 2016-12-31 NOTE — Care Management Important Message (Signed)
Important Message  Patient Details  Name: Laura Chambers MRN: [id number] Date of Birth: 1940/09/25   Medicare Important Message Given:  Yes    Saba Gomm, Chauncey Reading, RN 12/31/2016, 3:44 PM

## 2017-01-01 ENCOUNTER — Ambulatory Visit (INDEPENDENT_AMBULATORY_CARE_PROVIDER_SITE_OTHER): Payer: PPO | Admitting: Physician Assistant

## 2017-01-01 ENCOUNTER — Inpatient Hospital Stay
Admission: RE | Admit: 2017-01-01 | Discharge: 2017-01-24 | Disposition: A | Discharge status: Home / Self Care | Payer: PPO | Source: Ambulatory Visit | Attending: Internal Medicine | Admitting: Internal Medicine

## 2017-01-01 DIAGNOSIS — T8459XS Infection and inflammatory reaction due to other internal joint prosthesis, sequela: Secondary | ICD-10-CM | POA: Diagnosis not present

## 2017-01-01 DIAGNOSIS — M542 Cervicalgia: Secondary | ICD-10-CM | POA: Diagnosis not present

## 2017-01-01 DIAGNOSIS — M6281 Muscle weakness (generalized): Secondary | ICD-10-CM | POA: Diagnosis not present

## 2017-01-01 DIAGNOSIS — D649 Anemia, unspecified: Secondary | ICD-10-CM | POA: Diagnosis not present

## 2017-01-01 DIAGNOSIS — F411 Generalized anxiety disorder: Secondary | ICD-10-CM | POA: Diagnosis not present

## 2017-01-01 DIAGNOSIS — G9340 Encephalopathy, unspecified: Secondary | ICD-10-CM | POA: Diagnosis not present

## 2017-01-01 DIAGNOSIS — Z4733 Aftercare following explantation of knee joint prosthesis: Secondary | ICD-10-CM | POA: Diagnosis not present

## 2017-01-01 DIAGNOSIS — I119 Hypertensive heart disease without heart failure: Secondary | ICD-10-CM | POA: Diagnosis not present

## 2017-01-01 DIAGNOSIS — Z96659 Presence of unspecified artificial knee joint: Secondary | ICD-10-CM | POA: Diagnosis not present

## 2017-01-01 DIAGNOSIS — N1330 Unspecified hydronephrosis: Secondary | ICD-10-CM | POA: Diagnosis not present

## 2017-01-01 DIAGNOSIS — N179 Acute kidney failure, unspecified: Secondary | ICD-10-CM | POA: Diagnosis not present

## 2017-01-01 DIAGNOSIS — R262 Difficulty in walking, not elsewhere classified: Secondary | ICD-10-CM | POA: Diagnosis not present

## 2017-01-01 DIAGNOSIS — D1771 Benign lipomatous neoplasm of kidney: Secondary | ICD-10-CM | POA: Diagnosis not present

## 2017-01-01 DIAGNOSIS — M1990 Unspecified osteoarthritis, unspecified site: Secondary | ICD-10-CM | POA: Diagnosis not present

## 2017-01-01 DIAGNOSIS — I10 Essential (primary) hypertension: Secondary | ICD-10-CM | POA: Diagnosis not present

## 2017-01-01 DIAGNOSIS — R279 Unspecified lack of coordination: Secondary | ICD-10-CM | POA: Diagnosis not present

## 2017-01-01 DIAGNOSIS — R293 Abnormal posture: Secondary | ICD-10-CM | POA: Diagnosis not present

## 2017-01-01 DIAGNOSIS — L03116 Cellulitis of left lower limb: Secondary | ICD-10-CM | POA: Diagnosis not present

## 2017-01-01 DIAGNOSIS — E785 Hyperlipidemia, unspecified: Secondary | ICD-10-CM | POA: Diagnosis not present

## 2017-01-01 DIAGNOSIS — R12 Heartburn: Secondary | ICD-10-CM | POA: Diagnosis not present

## 2017-01-01 DIAGNOSIS — M25562 Pain in left knee: Secondary | ICD-10-CM | POA: Diagnosis not present

## 2017-01-01 DIAGNOSIS — R739 Hyperglycemia, unspecified: Secondary | ICD-10-CM | POA: Diagnosis not present

## 2017-01-01 DIAGNOSIS — G500 Trigeminal neuralgia: Secondary | ICD-10-CM | POA: Diagnosis not present

## 2017-01-01 DIAGNOSIS — G9009 Other idiopathic peripheral autonomic neuropathy: Secondary | ICD-10-CM | POA: Diagnosis not present

## 2017-01-01 DIAGNOSIS — F458 Other somatoform disorders: Secondary | ICD-10-CM | POA: Diagnosis not present

## 2017-01-01 DIAGNOSIS — R110 Nausea: Secondary | ICD-10-CM | POA: Diagnosis not present

## 2017-01-01 LAB — RENAL FUNCTION PANEL
ALBUMIN: 3.3 g/dL — AB (ref 3.5–5.0)
ANION GAP: 12 (ref 5–15)
BUN: 67 mg/dL — ABNORMAL HIGH (ref 6–20)
CO2: 28 mmol/L (ref 22–32)
Calcium: 8.8 mg/dL — ABNORMAL LOW (ref 8.9–10.3)
Chloride: 99 mmol/L — ABNORMAL LOW (ref 101–111)
Creatinine, Ser: 5.05 mg/dL — ABNORMAL HIGH (ref 0.44–1.00)
GFR calc Af Amer: 9 mL/min — ABNORMAL LOW (ref >60)
GFR, EST NON AFRICAN AMERICAN: 8 mL/min — AB (ref >60)
Glucose, Bld: 107 mg/dL — ABNORMAL HIGH (ref 65–99)
PHOSPHORUS: 4.1 mg/dL (ref 2.5–4.6)
POTASSIUM: 4.2 mmol/L (ref 3.5–5.1)
Sodium: 139 mmol/L (ref 135–145)

## 2017-01-01 MED ORDER — ATENOLOL 50 MG PO TABS: 50.0000 mg | ORAL_TABLET | Freq: Every day | ORAL | Status: DC

## 2017-01-01 MED ORDER — GABAPENTIN 100 MG PO CAPS: 100.0000 mg | ORAL_CAPSULE | Freq: Two times a day (BID) | ORAL | Status: DC

## 2017-01-01 MED ORDER — AMLODIPINE BESYLATE 10 MG PO TABS: 10.0000 mg | ORAL_TABLET | Freq: Every day | ORAL | Status: DC

## 2017-01-01 MED ORDER — FUROSEMIDE 40 MG PO TABS: 40.0000 mg | ORAL_TABLET | Freq: Every day | ORAL | 11 refills | Status: DC | PRN

## 2017-01-01 NOTE — Care Management Note (Signed)
Case Management Note  Patient Details  Name: Laura Chambers MRN: [id number] Date of Birth: 02/06/1941  Expected Discharge Date:  01/01/17               Expected Discharge Plan:  [place]  In-House Referral:  Clinical Social Work  Discharge planning Services  CM Consult  Post Acute Care Choice:  NA Choice offered to:  NA  Status of Service:  Completed, signed off  If discussed at [employer] of Stay Meetings, dates discussed:  01/01/2017  Additional Comments: Pt returning to SNF today. CSW to make arrangements.   Sherald Barge, RN 01/01/2017, 12:30 PM

## 2017-01-01 NOTE — Clinical Social Work Note (Signed)
Auth number [id number], 7 days, [employer] will follow up with [facility] for continued stay details.      Anavey Coombes, Clydene Pugh, LCSW

## 2017-01-01 NOTE — Discharge Summary (Signed)
Physician Discharge Summary  Laura Chambers ZSM:[id number] DOB: 11/14/1940 DOA: 12/15/2016  PCP: Lelon Perla, MD  Admit date: 12/15/2016 Discharge date: 01/01/2017  Admitted From: SNF Disposition:  SNF  Recommendations for Outpatient Follow-up:  1. Follow up with PCP in 1-2 weeks 2. Patient will need repeat BMP every other day to follow renal function 3. Follow up with Dr. Lowanda Foster in 1 week 4. Follow up with Dr. Rush Farmer in first week of July as previously scheduled 5. xarelto discontinued due to renal dysfunction and no clear indication for anticoagulation   Discharge Condition: stable CODE STATUS: full code Diet recommendation: Heart Healthy   Brief/Interim Summary: 76 year old woman who presented from the [facility] on 12/15/16 for further evaluation of worsening acute kidney injury. Her history is significant for hypertension, hyperlipidemia, renal angiomyolipoma, and a left total knee arthroplasty in 2001. The patient had been suffering with worsening left knee pain for at least 6 months. Dr. Jean Rosenthal performed outpatient arthrocentesis on 11/10/2016. The cultures were negative, but cell count showed 28,750 WBC. Subsequent, the patient was admitted to the hospital from 11/11/2016 through 11/19/2016. During that hospitalization, the patient had excision of her left total knee arthroplasty with placement of antibiotic spacer on 11/14/2016. She was initially treated with vancomycin and Zosyn, but this was switched to cefepime on 11/17/2016 secondary to increasing serum creatinine. Intraoperative cultures were negative for growth. The patient was discharged to a skilled nursing facility to finish 6 weeks of antibiotics with the anticipated stop date on 12/26/2016. Routine blood work obtained on 12/15/2016 showed that the patient's serum creatinine had increased to 4.76. As result, the patient was admitted to the hospital for further workup. Nephrology was consulted to  assist with management.   Discharge Diagnoses:  Principal Problem:   AKI (acute kidney injury) ([place]) Active Problems:   Infected prosthetic knee joint, sequela   Essential hypertension   Normocytic anemia   Acute metabolic encephalopathy   Obesity, Class III, BMI 40-49.9 (morbid obesity) ([place])  Acute kidney injury. The patient's baseline creatinine was 0.8-1.1 during her hospitalization May 2018. It started trending up the first week in June 1.42>>>2.02>>>4.76>>>5.65on admission. -Renal ultrasound ordered for evaluation and it was negative for hydronephrosis. -Nephrology consulted and the working diagnosis is interstitial nephritis from antibiotics versus ATN versus prerenal azotemia although interstitial nephritis was highly suspicious due to her 6 weeks of beta-lactam tx -Patient was started on IV fluids with subsequent IV Lasix added per nephrology -Urine eosinophils ordered but not collected -Her son had previously contemplated having the patient transferred to [facility] for renal biopsy, but he no longer desires this.Apparently, his mother voiced that she did not want a biopsy. -With treatment, her overall renal function has stabilized. Her creatinine remains elevated, but has been stable for the last several days. Discussed with Dr. Lowanda Foster, and it was felt that patient was stable to discharge with close nephrology follow-up in one week. He also recommended that patient have repeat BMP every other day until follow-up. Results can be sent to Dr. Florentina Addison office -Lasix has been changed to when necessary, to be used when she develops lower extremity edema.  Metabolic acidosis. The patient's CO2 was low on admission, from acute kidney injury. -Treated with Oral sodium bicarbonate. Her CO2 has normalized and bicarb d/ced on 6/23  Essential hypertension. -Patient is treated chronically with atenolol. -Due to increasing uncontrolled blood pressure, atenolol was increased from 25>50 mg  daily. -Amlodipine increased to 10 mg on 6/24. -Blood pressure is currently  stable  Acute metabolic encephalopathy.  -patient became agitated and delirious necessitating the start of as needed Haldol several days ago. Ativan which was also given resulted in more confusion, therefore it was discontinued. -multifactorial due to prolonged half life of opioid due to AKI, ativan, with major driver likely due to her renal failure -Pthad been treated with Xanax at the SNF with good response. She had also been treated with gabapentin at the SNF, but it was not restarted on admission due to confusion -Her TSH was within normal limits. Her ammonia level was within normal limits. Her vitamin B12 was within normal limits, but on the low end of normal-not likely contributing to her encephalopathy-oral supplement started. -Following Dr. Maralyn Sago conversationwith her son on 6/22, additional studies were ordered>>>repeat ammonia level 20; ABG completely normal; UA 6-30 WBC; CT head with white matter hypoattenuation posteriorly, but no mass-infarction-or hemorrhage. -Per her son, the patient had been on a course of Xanax which helped her "nerves". She had also been on gabapentin  -Scheduled small dosing of Xanax 0.25 mg twice a day and gabapentin at 100 mg twice a day (dose decreased due to AKI)started on 6/22. -mental status is improving with improving renal function  Abnormal head CT. -As above, head CT on 6/22 noted for white matter hypoattenuation posteriorly. -Due to the patient's resolving encephalopathy, will hold off on the brain MRI.  Fluid overload/pulmonary edema/Acute respiratory failure with hypoxia -Chest x-ray on 6/22 revealed vascular congestion and patchy bilateral airspace opacities left greater than right -Chest CT on 11/18/16 revealed scattered groundglass opacities bilaterally. -Patient does not appear to have pneumonia clinically -12/30/16 CXR--personally reviewed--slight improved  vasc congestion -2-D echo revealed an EF of 60-65%, Doppler parameters consistent with high ventricular filling pressure, and PA pressure of 44 mmHg.  -etiology diastolic--dysfunction in the setting of worsening renal function leading to pulm edema -The patient was treated with IV Lasix and IV fluids per nephrology. She does not appear to be in respiratory distress at this time. -Her intake and output has been less accurate since Foley catheter has been removed.  Prosthetic joint infection. -Patient completed antibiotic course on 12/25/16 with cefepime which was 42days from the operative excision. -Follow-up with orthopedics in first week of generalized previously scheduled  Normocytic anemia. -hemoglobin was 9.9 on admission with a baseline range of 10-11 -drop in Hgb multifactorial includingworsening renal function and possibly hemodilution with IV fluids. Doubt acute blood loss other than frequent venipunctures. -Her anemia panel revealed a normal total iron, elevated TIBC, and elevated ferritin of 457. Vitamin B-12 was within normal limits but on the lower end of normal-supplement started. -Continue to monitor.  Discharge Instructions  Discharge Instructions    Diet - low sodium heart healthy    Complete by:  As directed    Increase activity slowly    Complete by:  As directed      Allergies as of 01/01/2017      Reactions   Epinephrine Other (See Comments)   Reaction:  Increased pts BP and caused shoulder to jerk    Levofloxacin Nausea And Vomiting   Lipitor [atorvastatin] Other (See Comments)   Reaction:  Joint pain and dizziness    Trovan [alatrofloxacin] Nausea And Vomiting   Pt states that this med caused pancreatitis.      Medication List    STOP taking these medications   ceFEPIme 1 g in dextrose 5 % 50 mL   rivaroxaban 10 MG Tabs tablet Commonly known as:  Alveda Reasons  TAKE these medications   amLODipine 10 MG tablet Commonly known as:  NORVASC Take 1  tablet (10 mg total) by mouth daily. Start taking on:  01/02/2017   atenolol 50 MG tablet Commonly known as:  TENORMIN Take 1 tablet (50 mg total) by mouth daily. Start taking on:  01/02/2017 What changed:  medication strength  See the new instructions.   CALCIUM 600+D 600-400 MG-UNIT tablet Generic drug:  Calcium Carbonate-Vitamin D Take 1 tablet by mouth every other day.   docusate sodium 100 MG capsule Commonly known as:  COLACE Take 1 capsule (100 mg total) by mouth 2 (two) times daily.   folic acid 703 MCG tablet Commonly known as:  FOLVITE Take 400 mcg by mouth daily.   furosemide 40 MG tablet Commonly known as:  LASIX Take 1 tablet (40 mg total) by mouth daily as needed for edema.   gabapentin 100 MG capsule Commonly known as:  NEURONTIN Take 1 capsule (100 mg total) by mouth 2 (two) times daily. What changed:  medication strength  how much to take  when to take this   HYDROcodone-acetaminophen 5-325 MG tablet Commonly known as:  NORCO/VICODIN Take 1 tablet by mouth every 6 (six) hours as needed for moderate pain. DO NOT EXCEED 3GM OF APAP IN 24 HOURS FROM ALL SOURCES   multivitamin with minerals Tabs tablet Take 1 tablet by mouth every other day.   promethazine 25 MG tablet Commonly known as:  PHENERGAN Take 25 mg by mouth every 8 (eight) hours as needed for nausea or vomiting.   rosuvastatin 10 MG tablet Commonly known as:  CRESTOR Take 1 tablet (10 mg total) by mouth daily.   tamsulosin 0.4 MG Caps capsule Commonly known as:  FLOMAX Take 0.4 mg by mouth every evening.       Contact information for follow-up providers    Fran Lowes, MD Follow up in 1 week(s).   Specialty:  Nephrology Why:  check renal paneel every other day and fax result to my office Contact information: [address] [address] [phone]            Contact information for after-discharge care    [facility] Follow up.   Specialty:  Skilled Nursing Facility Contact information: [address] [address] [phone]                 Allergies  Allergen Reactions  . Epinephrine Other (See Comments)    Reaction:  Increased pts BP and caused shoulder to jerk   . Levofloxacin Nausea And Vomiting  . Lipitor [Atorvastatin] Other (See Comments)    Reaction:  Joint pain and dizziness   . Trovan [Alatrofloxacin] Nausea And Vomiting    Pt states that this med caused pancreatitis.    Consultations:  nephrology   Procedures/Studies: Dg Chest 1 View  Result Date: 12/26/2016 CLINICAL DATA:  Acute onset of shortness of breath. Acute renal insufficiency. Initial encounter. EXAM: CHEST 1 VIEW COMPARISON:  CTA of the chest performed 11/18/2016, and chest radiograph performed 08/25/2012 FINDINGS: The lungs are well-aerated. Mild vascular congestion is noted. Patchy bilateral airspace opacities, left greater than right, may reflect pneumonia. The appearance is less typical for asymmetric interstitial edema. Small bilateral pleural effusions are suggested. No pneumothorax is seen. A left PICC is noted ending about the proximal SVC. The cardiomediastinal silhouette is mildly enlarged. No acute osseous abnormalities are seen. IMPRESSION: Mild vascular congestion and  mild cardiomegaly. Patchy bilateral airspace opacities, left greater than right, may reflect pneumonia. The appearance is less typical for asymmetric interstitial edema. Small bilateral pleural effusions suggested. Electronically Signed   By: Garald Balding M.D.   On: 12/26/2016 19:40   Ct Head Wo Contrast  Result Date: 12/26/2016 CLINICAL DATA:  Confusion.  Altered mental status. EXAM: CT HEAD WITHOUT CONTRAST TECHNIQUE: Contiguous axial images were obtained from the base of the skull through the vertex without intravenous contrast. COMPARISON:  None. FINDINGS: Brain: No evidence of acute infarction, hemorrhage,  hydrocephalus, extra-axial collection or mass lesion/mass effect. There is white matter hypoattenuation, which is most evident in the posterior white matter of the parietal and occipital lobes. This is fairly symmetric. The ventricles and sulci are mildly enlarged consistent with mild diffuse atrophy. Vascular: No hyperdense vessel or unexpected calcification. Skull: Normal. Negative for fracture or focal lesion. Sinuses/Orbits: Globes and orbits are unremarkable. Visualized sinuses and mastoid air cells are clear. Other: None. IMPRESSION: 1. There is white matter hypoattenuation that is prominent posteriorly. Although this is nonspecific, consider PRES. Followup brain MRI would be helpful to further characterize this appearance. 2. There is no evidence of a mass cysts, recent infarction or of intracranial hemorrhage. Electronically Signed   By: Lajean Manes M.D.   On: 12/26/2016 20:39   US Renal  Result Date: 12/15/2016 CLINICAL DATA:  Renal failure EXAM: RENAL / URINARY TRACT ULTRASOUND COMPLETE COMPARISON:  None. FINDINGS: Right Kidney: Length: 11.2 cm. 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 No hydronephrosis. Left Kidney: Length: 11.6 cm.  No mass or hydronephrosis. Bladder: Underdistended. 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 No hydronephrosis. Electronically Signed   By: Julian Hy M.D.   On: 12/15/2016 14:10   Dg Chest Port 1 View  Result Date: 12/30/2016 CLINICAL DATA:  PICC line placement. EXAM: PORTABLE CHEST 1 VIEW COMPARISON:  12/26/2016 and prior exams FINDINGS: Cardiomegaly and pulmonary vascular congestion again noted. Left basilar atelectasis/ scarring again noted. A left PICC line has been slightly retracted with tip now overlying the medial left subclavian vein. There is no evidence of pneumothorax. IMPRESSION: Left PICC line slightly retracted now with tip overlying the medial left subclavian vein. Slightly decreased pulmonary vascular congestion and  improved bibasilar aeration. Electronically Signed   By: Margarette Canada M.D.   On: 12/30/2016 16:25   Echo: - Left ventricle: The cavity size was normal. Wall thickness was   increased in a pattern of mild LVH. Systolic function was normal.   The estimated ejection fraction was in the range of 60% to 65%.   Wall motion was normal; there were no regional wall motion   abnormalities. The study is not technically sufficient to allow   evaluation of LV diastolic function. Doppler parameters are   consistent with high ventricular filling pressure. - Aortic valve: Transvalvular velocity was within the normal range.   There was no stenosis. There was no regurgitation. - Mitral valve: Mildly calcified annulus. Transvalvular velocity   was within the normal range. There was no evidence for stenosis.   There was trivial regurgitation. - Right ventricle: The cavity size was normal. Wall thickness was   normal. Systolic function was normal. - Tricuspid valve: There was trivial regurgitation. - Pulmonary arteries: Systolic pressure was mildly increased. PA   peak pressure: 44 mm Hg (S).   Subjective: No shortness of breath or chest pain. Overall feeling well.  Discharge Exam: Vitals:   12/31/16 1400 01/01/17 8115  BP: (!) 160/64 (!) 121/40  Pulse: 66 71  Resp: 19 19  Temp: 97.8 F (36.6 C) 98.5 F (36.9 C)   Vitals:   12/31/16 0500 12/31/16 1400 12/31/16 2013 01/01/17 0629  BP: 138/61 (!) 160/64  (!) 121/40  Pulse: 68 66  71  Resp: 20 19  19   Temp: 97.8 F (36.6 C) 97.8 F (36.6 C)  98.5 F (36.9 C)  TempSrc: Oral Oral  Oral  SpO2: 95% 98% 92% 97%  Weight: 93.5 kg (206 lb 3.2 oz)   91.6 kg (201 lb 15.1 oz)  Height:        General: Pt is alert, awake, not in acute distress Cardiovascular: RRR, S1/S2 +, no rubs, no gallops Respiratory: CTA bilaterally, no wheezing, no rhonchi Abdominal: Soft, NT, ND, bowel sounds + Extremities: no edema, no cyanosis    The results of  significant diagnostics from this hospitalization (including imaging, microbiology, ancillary and laboratory) are listed below for reference.     Microbiology: No results found for this or any previous visit (from the past 240 hour(s)).   Labs: BNP (last 3 results) No results for input(s): BNP in the last 8760 hours. Basic Metabolic Panel:  Recent Labs Lab 12/28/16 0604 12/29/16 0606 12/30/16 0618 12/31/16 0550 01/01/17 0601  NA 140 140 139 137 139  K 3.7 4.0 3.7 4.0 4.2  CL 101 99* 97* 98* 99*  CO2 29 29 28 30 28   GLUCOSE 95 84 108* 92 107*  BUN 67* 70* 69* 67* 67*  CREATININE 5.34* 5.15* 5.07* 5.03* 5.05*  CALCIUM 8.1* 8.3* 8.4* 8.3* 8.8*  PHOS 5.1* 5.8* 4.4 4.2 4.1   Liver Function Tests:  Recent Labs Lab 12/28/16 0604 12/29/16 0606 12/30/16 0618 12/31/16 0550 01/01/17 0601  ALBUMIN 2.6* 2.6* 2.9* 2.8* 3.3*   No results for input(s): LIPASE, AMYLASE in the last 168 hours.  Recent Labs Lab 12/26/16 1850  AMMONIA 19   CBC:  Recent Labs Lab 12/27/16 0626 12/31/16 0548  WBC 10.3 8.0  HGB 8.5* 10.1*  HCT 25.8* 31.5*  MCV 87.2 88.2  PLT 212 243   Cardiac Enzymes: No results for input(s): CKTOTAL, CKMB, CKMBINDEX, TROPONINI in the last 168 hours. BNP: Invalid input(s): POCBNP CBG:  Recent Labs Lab 12/27/16 0044  GLUCAP 101*   D-Dimer No results for input(s): DDIMER in the last 72 hours. Hgb A1c No results for input(s): HGBA1C in the last 72 hours. Lipid Profile No results for input(s): CHOL, HDL, LDLCALC, TRIG, CHOLHDL, LDLDIRECT in the last 72 hours. Thyroid function studies No results for input(s): TSH, T4TOTAL, T3FREE, THYROIDAB in the last 72 hours.  Invalid input(s): FREET3 Anemia work up No results for input(s): VITAMINB12, FOLATE, FERRITIN, TIBC, IRON, RETICCTPCT in the last 72 hours. Urinalysis    Component Value Date/Time   COLORURINE COLORLESS (A) 12/26/2016 1838   APPEARANCEUR CLEAR 12/26/2016 1838   LABSPEC 1.006 12/26/2016  1838   PHURINE 5.0 12/26/2016 1838   GLUCOSEU NEGATIVE 12/26/2016 1838   HGBUR SMALL (A) 12/26/2016 1838   BILIRUBINUR NEGATIVE 12/26/2016 1838   KETONESUR NEGATIVE 12/26/2016 1838   PROTEINUR 30 (A) 12/26/2016 1838   UROBILINOGEN 0.2 08/25/2012 1408   NITRITE NEGATIVE 12/26/2016 1838   LEUKOCYTESUR TRACE (A) 12/26/2016 1838   Sepsis Labs Invalid input(s): PROCALCITONIN,  WBC,  LACTICIDVEN Microbiology No results found for this or any previous visit (from the past 240 hour(s)).   Time coordinating discharge: Over 30 minutes  SIGNED:   Kathie Dike, MD  [facility]  01/01/2017, 12:24 PM Pager   If 7PM-7AM, please contact night-coverage [email or web address] Password [email or web address]

## 2017-01-01 NOTE — Clinical Social Work Note (Signed)
LCSW started authorization with [employer]. Representative stated that the authorization may take awhile because she has two in front of her.      Laura Chambers, Laura Pugh, LCSW

## 2017-01-01 NOTE — Progress Notes (Signed)
PT Cancellation Note  Patient Details Name: Laura Chambers MRN: [id number] DOB: Mar 26, 1941   Cancelled Treatment:    Reason Eval/Treat Not Completed: Patient declined, no reason specified.  Pt is leaving today (anticipated) and she declined treatment.   Ramond Dial 01/01/2017, 2:28 PM   2:28 PM, 01/01/17 Mee Hives, PT, MS Physical Therapist - [place] [phone] (Office)

## 2017-01-01 NOTE — Progress Notes (Signed)
This encounter was created in error - please disregard.

## 2017-01-01 NOTE — Care Management Important Message (Signed)
Important Message  Patient Details  Name: Laura Chambers MRN: [id number] Date of Birth: Feb 17, 1941   Medicare Important Message Given:  Yes    Sherald Barge, RN 01/01/2017, 12:30 PM

## 2017-01-01 NOTE — Progress Notes (Signed)
Subjective: Interval History: She offers no complaint and feeling great Objective: Vital signs in last 24 hours: Temp:  [97.8 F (36.6 C)-98.5 F (36.9 C)] 98.5 F (36.9 C) (06/28 0629) Pulse Rate:  [66-71] 71 (06/28 0629) Resp:  [19] 19 (06/28 0629) BP: (121-160)/(40-64) 121/40 (06/28 0629) SpO2:  [92 %-98 %] 97 % (06/28 0629) Weight:  [91.6 kg (201 lb 15.1 oz)] 91.6 kg (201 lb 15.1 oz) (06/28 0629) Weight change: -1.932 kg (-4 lb 4.1 oz)  Intake/Output from previous day: 06/27 0701 - 06/28 0700 In: 4173.8 [P.O.:1080; I.V.:3093.8] Out: 201 [Urine:200; Stool:1] Intake/Output this shift: No intake/output data recorded.  Generally:  Chest: Decreased breath sound otherwise seems to be clear Heart exam revealed regular rate and rhythm no murmur Extremities: She has trace  edema bilaterally  Lab Results:  Recent Labs  12/31/16 0548  WBC 8.0  HGB 10.1*  HCT 31.5*  PLT 243   BMET:   Recent Labs  12/31/16 0550 01/01/17 0601  NA 137 139  K 4.0 4.2  CL 98* 99*  CO2 30 28  GLUCOSE 92 107*  BUN 67* 67*  CREATININE 5.03* 5.05*  CALCIUM 8.3* 8.8*   No results for input(s): PTH in the last 72 hours. Iron Studies:  No results for input(s): IRON, TIBC, TRANSFERRIN, FERRITIN in the last 72 hours.  Studies/Results: Dg Chest Port 1 View  Result Date: 12/30/2016 CLINICAL DATA:  PICC line placement. EXAM: PORTABLE CHEST 1 VIEW COMPARISON:  12/26/2016 and prior exams FINDINGS: Cardiomegaly and pulmonary vascular congestion again noted. Left basilar atelectasis/ scarring again noted. A left PICC line has been slightly retracted with tip now overlying the medial left subclavian vein. There is no evidence of pneumothorax. IMPRESSION: Left PICC line slightly retracted now with tip overlying the medial left subclavian vein. Slightly decreased pulmonary vascular congestion and improved bibasilar aeration. Electronically Signed   By: Margarette Canada M.D.   On: 12/30/2016 16:25    I have  reviewed the patient's current medications.  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 Patient states that she is making lots of urine . Her renal function is stable and no sign of fluid over load Problem #2 anemia: Her hemoglobin is 10.1 and much better Problem #3 hypertension: Her blood pressure is well  controlled Problem #4 hypokalemia: Her potassium is normal. Possibly due to Lasix Problem #5 history of infected prosthesis: Patient off antibiotics. She is afebrile and her white blood cell count is normal. Problem #6 bone and mineral  disorder: Her calcium and phosphorous is arrange this time. Problem #7 metabolic acidosis has corrected. Problem #8 history of confusion and agitation. Patient has returned to her baseline. Plan: 1] will D/C IVF and Lasix 2] Lasix Po could be started if patient develops fluid over load but presently se does not require 3] we will check her renal panel in the morning. If discharged check renal panel every other day for about week and I will see patient as out patient in about 1 week period   .      LOS: 17 days   Tatem Fesler S 01/01/2017,7:44 AM

## 2017-01-01 NOTE — Clinical Social Work Note (Signed)
LCSW received and shared insurance authorization with Marianna Fuss at [facility]. LCSW notified that patient was discharging.   LCSW signing off.      Aizik Reh, Clydene Pugh, LCSW

## 2017-01-01 NOTE — Progress Notes (Signed)
Patient is to be transported back to [facility] in stable condition. Report called to [facility] and staff instructed not to remove dressing over PICC line removal for 24 hours. Patient escorted by staff via wheelchair.

## 2017-01-02 ENCOUNTER — Other Ambulatory Visit: Payer: Self-pay

## 2017-01-02 ENCOUNTER — Non-Acute Institutional Stay (SKILLED_NURSING_FACILITY): Payer: PPO | Admitting: Internal Medicine

## 2017-01-02 ENCOUNTER — Encounter (HOSPITAL_COMMUNITY)
Admission: RE | Admit: 2017-01-02 | Discharge: 2017-01-02 | Disposition: A | Discharge status: Home / Self Care | Payer: PPO | Source: Skilled Nursing Facility | Attending: *Deleted | Admitting: *Deleted

## 2017-01-02 ENCOUNTER — Encounter: Payer: Self-pay | Admitting: Internal Medicine

## 2017-01-02 DIAGNOSIS — D649 Anemia, unspecified: Secondary | ICD-10-CM | POA: Diagnosis not present

## 2017-01-02 DIAGNOSIS — I10 Essential (primary) hypertension: Secondary | ICD-10-CM | POA: Diagnosis not present

## 2017-01-02 DIAGNOSIS — Z96659 Presence of unspecified artificial knee joint: Secondary | ICD-10-CM

## 2017-01-02 DIAGNOSIS — N179 Acute kidney failure, unspecified: Secondary | ICD-10-CM | POA: Diagnosis not present

## 2017-01-02 DIAGNOSIS — T8459XS Infection and inflammatory reaction due to other internal joint prosthesis, sequela: Secondary | ICD-10-CM

## 2017-01-02 DIAGNOSIS — G9341 Metabolic encephalopathy: Secondary | ICD-10-CM

## 2017-01-02 LAB — BASIC METABOLIC PANEL
ANION GAP: 14 (ref 5–15)
BUN: 71 mg/dL — ABNORMAL HIGH (ref 6–20)
CHLORIDE: 99 mmol/L — AB (ref 101–111)
CO2: 25 mmol/L (ref 22–32)
Calcium: 9.2 mg/dL (ref 8.9–10.3)
Creatinine, Ser: 5.34 mg/dL — ABNORMAL HIGH (ref 0.44–1.00)
GFR calc non Af Amer: 7 mL/min — ABNORMAL LOW (ref >60)
GFR, EST AFRICAN AMERICAN: 8 mL/min — AB (ref >60)
Glucose, Bld: 120 mg/dL — ABNORMAL HIGH (ref 65–99)
POTASSIUM: 4.3 mmol/L (ref 3.5–5.1)
Sodium: 138 mmol/L (ref 135–145)

## 2017-01-02 MED ORDER — HYDROCODONE-ACETAMINOPHEN 5-325 MG PO TABS: 1.0000 | ORAL_TABLET | Freq: Four times a day (QID) | ORAL | 0 refills | Status: DC | PRN

## 2017-01-02 NOTE — Progress Notes (Signed)
Location:   [place] Room Number: 128/P Place of Service:  SNF [phone]) Provider:  Leana Gamer, MD  Patient Care Team: Lelon Perla, MD as PCP - General (Cardiology)  Extended Emergency Contact Information Primary Emergency Contact: Tish Frederickson, [place] [address] [name] of [place] Phone: [phone] Relation: Son Secondary Emergency Contact: Tollie Eth [place] of [place] Mobile Phone: [phone] Relation: Son  Code Status:  Full Code Goals of care: Advanced Directive information Advanced Directives 01/02/2017  Does Patient Have a Medical Advance Directive? Yes  Type of Advance Directive (No Data)  Does patient want to make changes to medical advance directive? No - Patient declined  Would patient like information on creating a medical advance directive? No - Patient declined  Pre-existing out of facility DNR order (yellow form or pink MOST form) -     Chief Complaint  Patient presents with  . Acute Visit  Status post hospitalization for acute renal failure  HPI:  Pt is a 76 y.o. female seen today for an acute visit for follow-up of hospitalization for acute renal failure. 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  She did have an outpatient arthrocentesis back in May 2018 culture negative but cell count showed elevated white count of 20,750.  She was admitted to hospital in mid May during the hospitalization she had excision of her total knee arthroplasty 8 placement of an antibiotic spacer.  She treated with vancomycin and Zosyn and switch to cefapime secondary to a rising creatinine.  She was discharged here to skilled nursing and finished 6 weeks of antibiotics.  However her team blood work done at the facility showed an  increasing creatinine rising eventually up to 4.76--and she was admitted to the  hospital for further workup. Initially creatinine rose up to the fives area 5.65.  Renal ultrasound was negative for any hydronephrosis.  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  This was discussed with nephrology and felt patient was stable for discharge with close nephrology follow-up.  Recommendation is to do a BMP every other day until follow-up results sent to Dr.Befekadu's office.  On admission to hospital for CO2 level was low thought secondary to acute kidney injury this was treated with oral sodium bicarbonate her CO2 normalized and bicarbonate was discontinued on June 23.  He also had increased blood pressure her atenolol increased up to 50 mg a day and Norvasc was increased to 10 mg and apparently this was effective   She did have acute confusion during her stay with agitation and delirium and was started on when necessary Haldol and Ativan also was given but this resulted greater confusion and was discontinued.  His thought the confusion was multifactorial because of opiate use complicated with acute kidney injury as well as Ativan.  She did have a CT of the head that showed white matter hypoattenuation posterior but no mass infarction or hemorrhage.  It was decided to hold off on  brain MRI because of patient's resolving encephalopathy.  She also in pulmonary edema fluid overload.  Chest x-ray initially showed vascular congestion as she bilateral airspace opacities.  Chest CT also showed scattered groundglass opacities  bilaterally  She was not thought to have pneumonia clinically.  Repeat chest x-ray showed some improvement of the vascular congestion-2-D echo showed normal ejection fraction of 6065% Doppler parameters showed high ventricular filling  Pressure--indicative of diastolic dysfunction.  She was treated again with IV Lasix and IV fluids per nephrology apparently this helped.  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  Her hemoglobin also dropped slightly in the hospital thought multifactorial including worsening anal function of possible hemodilution with IV fluids.  Iron studies anemia panel showed normal iron elevated TIBC and elevated ferritin.  At this point recommendation continue to monitor.  Currently see is sitting on a chair comfortably has no acute complaints vital signs have been stable             Past Medical History:  Diagnosis Date  . Anxiety   . Arthritis   . Complication of anesthesia 2012   "irritated trachea" with cough x 1 year from intubation  . Esophageal hernia   . Fibromyalgia   . HTN (hypertension)   . Hx of colonic polyps   . Hydronephrosis of right kidney   . Hypercholesterolemia   . 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 Obesity   . 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 Renal angiomyolipoma    Past Surgical History:  Procedure Laterality Date  . ABDOMINAL HYSTERECTOMY  1969  . BACK SURGERY  2012   rod in back-  lumbar disc removal with bone graft  . blephoroplasty     bilateral  . BREAST BIOPSY  1989  . CHOLECYSTECTOMY  2010  . EXCISIONAL TOTAL KNEE ARTHROPLASTY WITH ANTIBIOTIC SPACERS Left 11/14/2016   Procedure: EXCISIONAL LEFT TOTAL KNEE ARTHROPLASTY WITH  PLACEMENT OF ANTIBIOTIC SPACERS;  Surgeon: [name], MD;  Location: [facility];  Service: Orthopedics;  Laterality: Left;  . NISSEN  FUNDOPLICATION    . PARAESOPHAGEAL HERNIA REPAIR  1989  . TONSILLECTOMY  1946  . TOTAL HIP ARTHROPLASTY Right 08/31/2012   Procedure: TOTAL HIP ARTHROPLASTY ANTERIOR APPROACH;  Surgeon: Mauri Pole, MD;  Location: [facility];  Service: Orthopedics;  Laterality: Right;  . TOTAL KNEE ARTHROPLASTY  2001   left  . TOTAL KNEE ARTHROPLASTY  2010   right    Allergies  Allergen Reactions  . Epinephrine Other (See Comments)    Reaction:  Increased pts BP and caused shoulder to jerk   . Levofloxacin Nausea And Vomiting  . Lipitor [Atorvastatin] Other (See Comments)    Reaction:  Joint pain and dizziness   . Trovan [Alatrofloxacin] Nausea And Vomiting    Pt states that this med caused pancreatitis.    Outpatient Encounter Prescriptions as of 01/02/2017  Medication Sig  . amLODipine (NORVASC) 10 MG tablet Take 1 tablet (10 mg total) by mouth daily.  Marland Kitchen atenolol (TENORMIN) 50 MG tablet Take 1 tablet (50 mg total) by mouth daily.  . Calcium Carbonate-Vitamin D (CALCIUM 600+D) 600-400 MG-UNIT tablet Take 1 tablet by mouth every other day.  . docusate sodium (COLACE) 100 MG capsule Take 1 capsule (100 mg total) by mouth 2 (two) times daily.  . folic acid (FOLVITE) 758 MCG  tablet Take 400 mcg by mouth daily.  . furosemide (LASIX) 40 MG tablet Take 1 tablet (40 mg total) by mouth daily as needed for edema.  . gabapentin (NEURONTIN) 100 MG capsule Take 1 capsule (100 mg total) by mouth 2 (two) times daily.  Marland Kitchen HYDROcodone-acetaminophen (NORCO/VICODIN) 5-325 MG tablet Take 1 tablet by mouth every 6 (six) hours as needed for moderate pain. DO NOT EXCEED 3GM OF APAP IN 24 HOURS FROM ALL SOURCES  . Multiple Vitamin (MULTIVITAMIN WITH MINERALS) TABS tablet Take 1 tablet by mouth every other day.  . promethazine (PHENERGAN) 25 MG tablet Take 25 mg by mouth every 8 (eight) hours as needed for nausea or vomiting.   . rosuvastatin (CRESTOR) 10 MG tablet Take 1 tablet (10 mg total) by mouth daily.  . tamsulosin  (FLOMAX) 0.4 MG CAPS capsule Take 0.4 mg by mouth every evening.   No facility-administered encounter medications on file as of 01/02/2017.     Review of Systems In general no complaints of fever or chills.  Skin does not complain of any rashes or itching.  Head ears eyes nose mouth and throat does not complaining of any sore throat or visual changes.  Respiratory is not complaining of shortness breath or cough.  Cardiac does not complain of chest pain as fairly minimal lower extremity edema more so her legs does not really have pedal edema.  GI is not complaining of any nausea vomiting diarrhea constipation.  Or abdominal pain.  Musculoskeletal does have her left leg in an immobilizer at this point joint pain appears to be controlled on Norco.  Neurologic is not complaining of dizziness headache or numbness or syncope.  Psych did have significant agitation in the hospital this appears to be resolved does not complain of overt depression or anxiety appears to be back at her baseline  There is no immunization history on file for this patient. Pertinent  Health Maintenance Due  Topic Date Due  . DEXA SCAN  05/18/2005  . PNA vac Low Risk Adult (1 of 2 - PCV13) 06/06/2017 (Originally 05/18/2005)  . INFLUENZA VACCINE  02/04/2017  . COLONOSCOPY  01/26/2019   No flowsheet data found. Functional Status Survey:    Temperature is 97.8 pulse 80 respirations 18 blood pressure 109/60  Physical Exam In general this is a pleasant elderly female who looks somewhat younger than her stated age.  Her skin is warm and dry.  Eyes pupils appear reactive light sclera and conjunctiva are clear visual acuity appears grossly intact.  Oropharynx is clear mucous membranes moist.   Chest is clear to auscultation there is no labored breathing.   Heart is regular rate and rhythm without murmur gallop or rub she has some leg edema but not really any significant pedal edema.  Her abdomen  somewhat obese soft nontender with positive bowel sounds.  Musculoskeletal is able to move all extremities 4 limited left lower extremity since she has an immobilizer on is able to wiggle toes capillary refill is intact on the left.  Neurologic is grossly intact her speech is clear no lateralizing findings.  Psych she is alert oriented pleasant and appropriate.   Labs reviewed:  Recent Labs  12/30/16 0618 12/31/16 0550 01/01/17 0601 01/02/17 0900  NA 139 137 139 138  K 3.7 4.0 4.2 4.3  CL 97* 98* 99* 99*  CO2 28 30 28 25   GLUCOSE 108* 92 107* 120*  BUN 69* 67* 67* 71*  CREATININE 5.07* 5.03* 5.05* 5.34*  CALCIUM  8.4* 8.3* 8.8* 9.2  PHOS 4.4 4.2 4.1  --     Recent Labs  04/15/16 1145 12/16/16 0609  12/30/16 0618 12/31/16 0550 01/01/17 0601  AST 19 18  --   --   --   --   ALT 11 13*  --   --   --   --   ALKPHOS 82 86  --   --   --   --   BILITOT 1.4* 0.5  --   --   --   --   PROT 6.5 6.6  --   --   --   --   ALBUMIN 3.7 2.5*  < > 2.9* 2.8* 3.3*  < > = values in this interval not displayed.  Recent Labs  12/08/16 0723 12/09/16 0700 12/15/16 1350  12/21/16 0522 12/27/16 0626 12/31/16 0548  WBC 7.6 7.3 7.8  < > 8.4 10.3 8.0  NEUTROABS 4.2 3.9 4.6  --   --   --   --   HGB 11.2* 10.0* 9.9*  < > 8.6* 8.5* 10.1*  HCT 35.2* 30.8* 31.0*  < > 26.6* 25.8* 31.5*  MCV 89.8 89.3 88.6  < > 88.4 87.2 88.2  PLT 224 231 214  < > 191 212 243  < > = values in this interval not displayed. Lab Results  Component Value Date   TSH 2.329 12/24/2016   No results found for: HGBA1C Lab Results  Component Value Date   CHOL 136 04/15/2016   HDL 42 (L) 04/15/2016   LDLCALC 57 04/15/2016   TRIG 184 (H) 04/15/2016   CHOLHDL 3.2 04/15/2016    Significant Diagnostic Results in last 30 days:  Dg Chest 1 View  Result Date: 12/26/2016 CLINICAL DATA:  Acute onset of shortness of breath. Acute renal insufficiency. Initial encounter. EXAM: CHEST 1 VIEW COMPARISON:  CTA of the chest  performed 11/18/2016, and chest radiograph performed 08/25/2012 FINDINGS: The lungs are well-aerated. Mild vascular congestion is noted. Patchy bilateral airspace opacities, left greater than right, may reflect pneumonia. The appearance is less typical for asymmetric interstitial edema. Small bilateral pleural effusions are suggested. No pneumothorax is seen. A left PICC is noted ending about the proximal SVC. The cardiomediastinal silhouette is mildly enlarged. No acute osseous abnormalities are seen. IMPRESSION: Mild vascular congestion and mild cardiomegaly. Patchy bilateral airspace opacities, left greater than right, may reflect pneumonia. The appearance is less typical for asymmetric interstitial edema. Small bilateral pleural effusions suggested. Electronically Signed   By: Garald Balding M.D.   On: 12/26/2016 19:40   Ct Head Wo Contrast  Result Date: 12/26/2016 CLINICAL DATA:  Confusion.  Altered mental status. EXAM: CT HEAD WITHOUT CONTRAST TECHNIQUE: Contiguous axial images were obtained from the base of the skull through the vertex without intravenous contrast. COMPARISON:  None. FINDINGS: Brain: No evidence of acute infarction, hemorrhage, hydrocephalus, extra-axial collection or mass lesion/mass effect. There is white matter hypoattenuation, which is most evident in the posterior white matter of the parietal and occipital lobes. This is fairly symmetric. The ventricles and sulci are mildly enlarged consistent with mild diffuse atrophy. Vascular: No hyperdense vessel or unexpected calcification. Skull: Normal. Negative for fracture or focal lesion. Sinuses/Orbits: Globes and orbits are unremarkable. Visualized sinuses and mastoid air cells are clear. Other: None. IMPRESSION: 1. There is white matter hypoattenuation that is prominent posteriorly. Although this is nonspecific, consider PRES. Followup brain MRI would be helpful to further characterize this appearance. 2. There is no evidence of a mass  cysts, recent infarction or of intracranial hemorrhage. Electronically Signed   By: Lajean Manes M.D.   On: 12/26/2016 20:39   US Renal  Result Date: 12/15/2016 CLINICAL DATA:  Renal failure EXAM: RENAL / URINARY TRACT ULTRASOUND COMPLETE COMPARISON:  None. FINDINGS: Right Kidney: Length: 11.2 cm. 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 No hydronephrosis. Left Kidney: Length: 11.6 cm.  No mass or hydronephrosis. Bladder: Underdistended. 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 No hydronephrosis. Electronically Signed   By: Julian Hy M.D.   On: 12/15/2016 14:10   Dg Chest Port 1 View  Result Date: 12/30/2016 CLINICAL DATA:  PICC line placement. EXAM: PORTABLE CHEST 1 VIEW COMPARISON:  12/26/2016 and prior exams FINDINGS: Cardiomegaly and pulmonary vascular congestion again noted. Left basilar atelectasis/ scarring again noted. A left PICC line has been slightly retracted with tip now overlying the medial left subclavian vein. There is no evidence of pneumothorax. IMPRESSION: Left PICC line slightly retracted now with tip overlying the medial left subclavian vein. Slightly decreased pulmonary vascular congestion and improved bibasilar aeration. Electronically Signed   By: Margarette Canada M.D.   On: 12/30/2016 16:25    Assessment/Plan  (1 history of acute renal failure creatinine is still elevated above 5 but has stabilized she is thought to be doing better she will have close follow-up by nephrology labs today showed creatinine of 5.34 bun of 71 and CO2 level XXV-results will be faxed to nephrology for follow-up she does have nephrology followup arranged clinically she appears to be stable here certainly no evidence of encephalopathy at this point she appears to be back at baseline.--Currently has orders for a BMP every other day with nephrology notified of results  #2 hypertension her atenolol and Norvasc were decreased in the hospital this appears stable at present although  we have fairly minimal readings at this point will monitor.  #3 history of encephalopathy again appears to be improved be possibly medication related with use of Ativan and Haldol this point will monitor.  #4-history of left total knee arthroplasty with infection and placement of antibiotic spacer-she has completed her antibiotic she does have orthopedic follow-up scheduled  #5 history of pulmonary edema fluid overload again this was followed closely by nephrology with some doses of IV Lasix-clinically she appears to be stable in this regard chest x-ray did show improvement ankle did show diastolic dysfunction at this point will monitor clinically she does have a order for Lasix when necessary for increased edema will monitor weights.  -History of neuropathy she continues on twice a day this appears to be helping.  #7 history of hyperlipidemia she is on Crestor.  Number  8 pain she is as needed she is not really complaining of pain currently at this point will monitor  #9-history of anemia thought possibly multifactorial including worsening renal function as well as chemotherapy dilution with IV fluids-this appears to be stabilized at 10.1 which is relatively her baseline Will update this early next week.  #10 history of urinary retention she is on Flomax apparently she is voiding  well this will have to be watched  [id number]-of note greater than 40 minutes spent assessing patient-reviewing her chart-reviewing her labs-and coordinating formulating plan of care for numerous diagnoses-of note greater than 50% of time spent coordinating plan of care

## 2017-01-02 NOTE — Telephone Encounter (Signed)
RX faxed to [facility] @ [phone]. Phone number [phone]  

## 2017-01-04 ENCOUNTER — Encounter (HOSPITAL_COMMUNITY)
Admission: AD | Admit: 2017-01-04 | Discharge: 2017-01-04 | Disposition: A | Discharge status: Home / Self Care | Payer: PPO | Source: Skilled Nursing Facility | Attending: Internal Medicine | Admitting: Internal Medicine

## 2017-01-04 DIAGNOSIS — I119 Hypertensive heart disease without heart failure: Secondary | ICD-10-CM | POA: Diagnosis not present

## 2017-01-04 DIAGNOSIS — L03116 Cellulitis of left lower limb: Secondary | ICD-10-CM | POA: Diagnosis not present

## 2017-01-04 DIAGNOSIS — E785 Hyperlipidemia, unspecified: Secondary | ICD-10-CM | POA: Diagnosis not present

## 2017-01-04 DIAGNOSIS — R279 Unspecified lack of coordination: Secondary | ICD-10-CM | POA: Diagnosis not present

## 2017-01-04 DIAGNOSIS — M542 Cervicalgia: Secondary | ICD-10-CM | POA: Diagnosis not present

## 2017-01-04 DIAGNOSIS — M1990 Unspecified osteoarthritis, unspecified site: Secondary | ICD-10-CM | POA: Diagnosis not present

## 2017-01-04 DIAGNOSIS — G500 Trigeminal neuralgia: Secondary | ICD-10-CM | POA: Insufficient documentation

## 2017-01-04 DIAGNOSIS — T84093A Other mechanical complication of internal left knee prosthesis, initial encounter: Secondary | ICD-10-CM | POA: Diagnosis not present

## 2017-01-04 DIAGNOSIS — R739 Hyperglycemia, unspecified: Secondary | ICD-10-CM | POA: Diagnosis not present

## 2017-01-04 DIAGNOSIS — F411 Generalized anxiety disorder: Secondary | ICD-10-CM | POA: Insufficient documentation

## 2017-01-04 DIAGNOSIS — R339 Retention of urine, unspecified: Secondary | ICD-10-CM | POA: Diagnosis not present

## 2017-01-04 DIAGNOSIS — R293 Abnormal posture: Secondary | ICD-10-CM | POA: Diagnosis not present

## 2017-01-04 DIAGNOSIS — M25562 Pain in left knee: Secondary | ICD-10-CM | POA: Diagnosis not present

## 2017-01-04 DIAGNOSIS — M25462 Effusion, left knee: Secondary | ICD-10-CM | POA: Diagnosis not present

## 2017-01-04 DIAGNOSIS — G9009 Other idiopathic peripheral autonomic neuropathy: Secondary | ICD-10-CM | POA: Insufficient documentation

## 2017-01-04 DIAGNOSIS — M6281 Muscle weakness (generalized): Secondary | ICD-10-CM | POA: Diagnosis not present

## 2017-01-04 DIAGNOSIS — T8459XS Infection and inflammatory reaction due to other internal joint prosthesis, sequela: Secondary | ICD-10-CM | POA: Diagnosis not present

## 2017-01-04 DIAGNOSIS — D1771 Benign lipomatous neoplasm of kidney: Secondary | ICD-10-CM | POA: Diagnosis not present

## 2017-01-04 DIAGNOSIS — F458 Other somatoform disorders: Secondary | ICD-10-CM | POA: Insufficient documentation

## 2017-01-04 DIAGNOSIS — G9341 Metabolic encephalopathy: Secondary | ICD-10-CM | POA: Diagnosis not present

## 2017-01-04 DIAGNOSIS — L0390 Cellulitis, unspecified: Secondary | ICD-10-CM | POA: Diagnosis not present

## 2017-01-04 DIAGNOSIS — I10 Essential (primary) hypertension: Secondary | ICD-10-CM | POA: Diagnosis not present

## 2017-01-04 DIAGNOSIS — Z96659 Presence of unspecified artificial knee joint: Secondary | ICD-10-CM | POA: Diagnosis not present

## 2017-01-04 DIAGNOSIS — N179 Acute kidney failure, unspecified: Secondary | ICD-10-CM | POA: Diagnosis not present

## 2017-01-04 DIAGNOSIS — Z4733 Aftercare following explantation of knee joint prosthesis: Secondary | ICD-10-CM | POA: Insufficient documentation

## 2017-01-04 DIAGNOSIS — R262 Difficulty in walking, not elsewhere classified: Secondary | ICD-10-CM | POA: Diagnosis not present

## 2017-01-04 DIAGNOSIS — D649 Anemia, unspecified: Secondary | ICD-10-CM | POA: Diagnosis not present

## 2017-01-04 DIAGNOSIS — N1330 Unspecified hydronephrosis: Secondary | ICD-10-CM | POA: Diagnosis not present

## 2017-01-04 LAB — BASIC METABOLIC PANEL
ANION GAP: 13 (ref 5–15)
BUN: 78 mg/dL — ABNORMAL HIGH (ref 6–20)
CALCIUM: 9.3 mg/dL (ref 8.9–10.3)
CHLORIDE: 100 mmol/L — AB (ref 101–111)
CO2: 26 mmol/L (ref 22–32)
CREATININE: 5.82 mg/dL — AB (ref 0.44–1.00)
GFR calc non Af Amer: 6 mL/min — ABNORMAL LOW (ref >60)
GFR, EST AFRICAN AMERICAN: 7 mL/min — AB (ref >60)
Glucose, Bld: 117 mg/dL — ABNORMAL HIGH (ref 65–99)
Potassium: 4.4 mmol/L (ref 3.5–5.1)
SODIUM: 139 mmol/L (ref 135–145)

## 2017-01-05 ENCOUNTER — Encounter: Payer: Self-pay | Admitting: Internal Medicine

## 2017-01-05 ENCOUNTER — Non-Acute Institutional Stay (SKILLED_NURSING_FACILITY): Payer: PPO | Admitting: Internal Medicine

## 2017-01-05 DIAGNOSIS — D649 Anemia, unspecified: Secondary | ICD-10-CM

## 2017-01-05 DIAGNOSIS — G9341 Metabolic encephalopathy: Secondary | ICD-10-CM | POA: Diagnosis not present

## 2017-01-05 DIAGNOSIS — N179 Acute kidney failure, unspecified: Secondary | ICD-10-CM | POA: Diagnosis not present

## 2017-01-05 DIAGNOSIS — I10 Essential (primary) hypertension: Secondary | ICD-10-CM | POA: Diagnosis not present

## 2017-01-05 NOTE — Progress Notes (Signed)
Location:   [place] Room Number: 128/P Place of Service:  SNF [phone]) Provider:  Leana Gamer, MD  Patient Care Team: Lelon Perla, MD as PCP - General (Cardiology)  Extended Emergency Contact Information Primary Emergency Contact: Tish Frederickson, [place] [address] [name] of [place] Phone: [phone] Relation: Son Secondary Emergency Contact: Tollie Eth [place] of [place] Mobile Phone: [phone] Relation: Son  Code Status:  Full Code Goals of care: Advanced Directive information Advanced Directives 01/05/2017  Does Patient Have a Medical Advance Directive? Yes  Type of Advance Directive (No Data)  Does patient want to make changes to medical advance directive? No - Patient declined  Would patient like information on creating a medical advance directive? No - Patient declined  Pre-existing out of facility DNR order (yellow form or pink MOST form) -     Chief complaint-acute visit follow-up acute renal insufficiency  HPI:  Pt is a 76 y.o. female seen today for an acute visit for follow-up of renal insufficiency. 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  She was admitted here for rehabilitation and monitoring her antibiotics after excision of a right total knee arthroplasty with placement of an antibiotic spacer-  Routine blood work it facility showed a gradually increasing creatinine eventually up to-4.76 which was admitted to the hospital for further workup.  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  Since her discharge. Creatinine has been relatively stable to slowly rising -0-most recent creatinines have been- 5.03 went to 5.34 on mostrecent lab done on July 1 was 5.82  BUN was 78 which is shown a slow rises well.  Clinically she appears to be doing well vital signs are stable continues to be bright alert in good spirits there was significant confusion in the hospital which has resolved.          Past Medical History:  Diagnosis Date  . Anxiety   . Arthritis   . Complication of anesthesia 2012   "irritated trachea" with cough x 1 year from intubation  . Esophageal hernia   . Fibromyalgia   . HTN (hypertension)   . Hx of colonic polyps   . Hydronephrosis of right kidney   . Hypercholesterolemia   . Neuropathy due to medical condition ([place])    bilateral lower legs  . Obesity   . 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 Renal angiomyolipoma    Past Surgical History:  Procedure Laterality Date  . ABDOMINAL HYSTERECTOMY  1969  . BACK SURGERY  2012   rod in back-  lumbar disc removal with bone graft  . blephoroplasty     bilateral  . BREAST BIOPSY  1989  . CHOLECYSTECTOMY  2010  . EXCISIONAL TOTAL KNEE ARTHROPLASTY WITH ANTIBIOTIC SPACERS Left 11/14/2016   Procedure: EXCISIONAL  LEFT TOTAL KNEE ARTHROPLASTY WITH  PLACEMENT OF ANTIBIOTIC SPACERS;  Surgeon: [name], MD;  Location: [facility];  Service: Orthopedics;  Laterality: Left;  . NISSEN FUNDOPLICATION    . PARAESOPHAGEAL HERNIA REPAIR  1989  . TONSILLECTOMY  1946  . TOTAL HIP ARTHROPLASTY Right 08/31/2012   Procedure: TOTAL HIP ARTHROPLASTY ANTERIOR APPROACH;  Surgeon:  Mauri Pole, MD;  Location: [facility];  Service: Orthopedics;  Laterality: Right;  . TOTAL KNEE ARTHROPLASTY  2001   left  . TOTAL KNEE ARTHROPLASTY  2010   right    Allergies  Allergen Reactions  . Epinephrine Other (See Comments)    Reaction:  Increased pts BP and caused shoulder to jerk   . Levofloxacin Nausea And Vomiting  . Lipitor [Atorvastatin] Other (See Comments)    Reaction:  Joint pain and dizziness   . Trovan [Alatrofloxacin] Nausea And Vomiting    Pt states that this med caused pancreatitis.    Outpatient Encounter Prescriptions as of 01/05/2017  Medication Sig  . amLODipine (NORVASC) 10 MG tablet Take 1 tablet (10 mg total) by mouth daily.  Marland Kitchen atenolol (TENORMIN) 50 MG tablet Take 1 tablet (50 mg total) by mouth daily.  . Calcium Carbonate-Vitamin D (CALCIUM 600+D) 600-400 MG-UNIT tablet Take 1 tablet by mouth every other day.  . docusate sodium (COLACE) 100 MG capsule Take 1 capsule (100 mg total) by mouth 2 (two) times daily.  . folic acid (FOLVITE) 852 MCG tablet Take 400 mcg by mouth daily.  . furosemide (LASIX) 40 MG tablet Take 1 tablet (40 mg total) by mouth daily as needed for edema.  . gabapentin (NEURONTIN) 100 MG capsule Take 1 capsule (100 mg total) by mouth 2 (two) times daily.  Marland Kitchen HYDROcodone-acetaminophen (NORCO/VICODIN) 5-325 MG tablet Take 1 tablet by mouth every 6 (six) hours as needed for moderate pain. DO NOT EXCEED 3GM OF APAP IN 24 HOURS FROM ALL SOURCES  . Multiple Vitamin (MULTIVITAMIN WITH MINERALS) TABS tablet Take 1 tablet by mouth every other day.  . promethazine (PHENERGAN) 25 MG tablet Take 25 mg by mouth every 8 (eight) hours as needed for nausea or vomiting.   . rosuvastatin (CRESTOR) 10 MG tablet Take 1 tablet (10 mg total) by mouth daily.  . tamsulosin (FLOMAX) 0.4 MG CAPS capsule Take 0.4 mg by mouth every evening.   No facility-administered encounter medications on file as of 01/05/2017.     Review of Systems In general no complaints of  fever or chills.  Skin does not complain of any rashes or itching.  Head ears eyes nose mouth and throat does not complaining of any sore throat or visual changes.  Respiratory is not complaining of shortness breath or cough.  Cardiac does not complain of chest pain as fairly minimal lower extremity edema   GI is not complaining of any nausea vomiting diarrhea constipation or abdominal discomfort  Musculoskeletal does have her left leg in an immobilizer at this point joint pain appears to be controlled on Norco.  Neurologic is not complaining of dizziness headache or numbness or syncope.  Psych did have significant agitation in the hospital this appears to be resolved does not complain of overt depression or anxiety appears to be back at her baseline-cheerful good spirits   There is no immunization history on file for this patient. Pertinent  Health Maintenance Due  Topic Date Due  . DEXA SCAN  05/18/2005  . PNA vac Low Risk Adult (1 of 2 - PCV13) 06/06/2017 (  Originally 05/18/2005)  . INFLUENZA VACCINE  02/04/2017  . COLONOSCOPY  01/26/2019   No flowsheet data found. Functional Status Survey:    Vitals:   01/05/17 1352  BP: (!) 125/52  Pulse: 70  Resp: 20  Temp: 98.1 F (36.7 C)  TempSrc: Oral    Physical Exam In general this is a pleasant elderly female who looks somewhat younger than her stated age. Sitting comfortably in her wheelchair  Her skin is warm and dry.  Eyes pupils appear reactive light sclera and conjunctiva are clear visual acuity appears grossly intact.  Oropharynx is clear mucous membranes moist.   Chest is clear to auscultation there is no labored breathing.   Heart is regular rate and rhythm without murmur gallop or rub -heart sounds are somewhat distant -she has some leg edema but not really any significant pedal edema.  Her abdomen somewhat obese soft nontender with positive bowel sounds.  Musculoskeletal is able to move  all extremities 4 limited left lower extremity since she has an immobilizer on is able to wiggle toes capillary refill is intact on the left this is baseline with previous exam.  Neurologic is grossly intact her speech is clear no lateralizing findings.  Psych she is alert oriented pleasant and appropriate.  Labs reviewed:  Recent Labs  12/30/16 0618 12/31/16 0550 01/01/17 0601 01/02/17 0900 01/04/17 0430  NA 139 137 139 138 139  K 3.7 4.0 4.2 4.3 4.4  CL 97* 98* 99* 99* 100*  CO2 28 30 28 25 26   GLUCOSE 108* 92 107* 120* 117*  BUN 69* 67* 67* 71* 78*  CREATININE 5.07* 5.03* 5.05* 5.34* 5.82*  CALCIUM 8.4* 8.3* 8.8* 9.2 9.3  PHOS 4.4 4.2 4.1  --   --     Recent Labs  04/15/16 1145 12/16/16 0609  12/30/16 0618 12/31/16 0550 01/01/17 0601  AST 19 18  --   --   --   --   ALT 11 13*  --   --   --   --   ALKPHOS 82 86  --   --   --   --   BILITOT 1.4* 0.5  --   --   --   --   PROT 6.5 6.6  --   --   --   --   ALBUMIN 3.7 2.5*  < > 2.9* 2.8* 3.3*  < > = values in this interval not displayed.  Recent Labs  12/08/16 0723 12/09/16 0700 12/15/16 1350  12/21/16 0522 12/27/16 0626 12/31/16 0548  WBC 7.6 7.3 7.8  < > 8.4 10.3 8.0  NEUTROABS 4.2 3.9 4.6  --   --   --   --   HGB 11.2* 10.0* 9.9*  < > 8.6* 8.5* 10.1*  HCT 35.2* 30.8* 31.0*  < > 26.6* 25.8* 31.5*  MCV 89.8 89.3 88.6  < > 88.4 87.2 88.2  PLT 224 231 214  < > 191 212 243  < > = values in this interval not displayed. Lab Results  Component Value Date   TSH 2.329 12/24/2016   No results found for: HGBA1C Lab Results  Component Value Date   CHOL 136 04/15/2016   HDL 42 (L) 04/15/2016   LDLCALC 57 04/15/2016   TRIG 184 (H) 04/15/2016   CHOLHDL 3.2 04/15/2016    Significant Diagnostic Results in last 30 days:  Dg Chest 1 View  Result Date: 12/26/2016 CLINICAL DATA:  Acute onset of shortness of breath. Acute renal insufficiency. Initial  encounter. EXAM: CHEST 1 VIEW COMPARISON:  CTA of the chest  performed 11/18/2016, and chest radiograph performed 08/25/2012 FINDINGS: The lungs are well-aerated. Mild vascular congestion is noted. Patchy bilateral airspace opacities, left greater than right, may reflect pneumonia. The appearance is less typical for asymmetric interstitial edema. Small bilateral pleural effusions are suggested. No pneumothorax is seen. A left PICC is noted ending about the proximal SVC. The cardiomediastinal silhouette is mildly enlarged. No acute osseous abnormalities are seen. IMPRESSION: Mild vascular congestion and mild cardiomegaly. Patchy bilateral airspace opacities, left greater than right, may reflect pneumonia. The appearance is less typical for asymmetric interstitial edema. Small bilateral pleural effusions suggested. Electronically Signed   By: Garald Balding M.D.   On: 12/26/2016 19:40   Ct Head Wo Contrast  Result Date: 12/26/2016 CLINICAL DATA:  Confusion.  Altered mental status. EXAM: CT HEAD WITHOUT CONTRAST TECHNIQUE: Contiguous axial images were obtained from the base of the skull through the vertex without intravenous contrast. COMPARISON:  None. FINDINGS: Brain: No evidence of acute infarction, hemorrhage, hydrocephalus, extra-axial collection or mass lesion/mass effect. There is white matter hypoattenuation, which is most evident in the posterior white matter of the parietal and occipital lobes. This is fairly symmetric. The ventricles and sulci are mildly enlarged consistent with mild diffuse atrophy. Vascular: No hyperdense vessel or unexpected calcification. Skull: Normal. Negative for fracture or focal lesion. Sinuses/Orbits: Globes and orbits are unremarkable. Visualized sinuses and mastoid air cells are clear. Other: None. IMPRESSION: 1. There is white matter hypoattenuation that is prominent posteriorly. Although this is nonspecific, consider PRES. Followup brain MRI would be helpful to further characterize this appearance. 2. There is no evidence of a mass  cysts, recent infarction or of intracranial hemorrhage. Electronically Signed   By: Lajean Manes M.D.   On: 12/26/2016 20:39   US Renal  Result Date: 12/15/2016 CLINICAL DATA:  Renal failure EXAM: RENAL / URINARY TRACT ULTRASOUND COMPLETE COMPARISON:  None. FINDINGS: Right Kidney: Length: 11.2 cm. 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 No hydronephrosis. Left Kidney: Length: 11.6 cm.  No mass or hydronephrosis. Bladder: Underdistended. 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 No hydronephrosis. Electronically Signed   By: Julian Hy M.D.   On: 12/15/2016 14:10   Dg Chest Port 1 View  Result Date: 12/30/2016 CLINICAL DATA:  PICC line placement. EXAM: PORTABLE CHEST 1 VIEW COMPARISON:  12/26/2016 and prior exams FINDINGS: Cardiomegaly and pulmonary vascular congestion again noted. Left basilar atelectasis/ scarring again noted. A left PICC line has been slightly retracted with tip now overlying the medial left subclavian vein. There is no evidence of pneumothorax. IMPRESSION: Left PICC line slightly retracted now with tip overlying the medial left subclavian vein. Slightly decreased pulmonary vascular congestion and improved bibasilar aeration. Electronically Signed   By: Margarette Canada M.D.   On: 12/30/2016 16:25    Assessment/Plan   #1-history of acute renal failure-creatinine appears to be relatively stabilized in the 5 range-nephrology has been made aware and recommendation to continue monitoring with BMPs every other day.  Clinically she appears to be doing well no evidence of confusion-vital signs are stable appears to be back at her baseline which is encouraging  #2 anemia this has improved with a hemoglobin of 10.1 will monitor periodically--this was thought to have possible numerous etiologies including worsening renal function as well as dilution with IV fluids in the hospital  #3 hypertension this appears stable with blood pressure 125/52-her atenolol and  Norvasc  decrease  in the hospital-nonetheless her blood pressure appears to have tolerated this fairly well.--We'll continue to monitor.  Number f4-encephalopathy again this appears resolved thought possibly medication related in the hospital with use of Ativan and Haldol.  #5 history of left total knee arthroplasty with infection and placement of antibiotic spacer-she has completed her antibiotic-she has orthopedic follow-up scheduled  [phone]

## 2017-01-06 ENCOUNTER — Encounter (HOSPITAL_COMMUNITY)
Admission: RE | Admit: 2017-01-06 | Discharge: 2017-01-06 | Disposition: A | Discharge status: Home / Self Care | Payer: PPO | Source: Skilled Nursing Facility | Attending: *Deleted | Admitting: *Deleted

## 2017-01-06 LAB — BASIC METABOLIC PANEL
Anion gap: 12 (ref 5–15)
BUN: 79 mg/dL — AB (ref 6–20)
CO2: 24 mmol/L (ref 22–32)
Calcium: 8.7 mg/dL — ABNORMAL LOW (ref 8.9–10.3)
Chloride: 100 mmol/L — ABNORMAL LOW (ref 101–111)
Creatinine, Ser: 5.66 mg/dL — ABNORMAL HIGH (ref 0.44–1.00)
GFR calc Af Amer: 8 mL/min — ABNORMAL LOW (ref >60)
GFR, EST NON AFRICAN AMERICAN: 7 mL/min — AB (ref >60)
GLUCOSE: 96 mg/dL (ref 65–99)
POTASSIUM: 4.2 mmol/L (ref 3.5–5.1)
Sodium: 136 mmol/L (ref 135–145)

## 2017-01-06 LAB — CBC WITH DIFFERENTIAL/PLATELET
BASOS ABS: 0.1 10*3/uL (ref 0.0–0.1)
Basophils Relative: 1 %
EOS PCT: 5 %
Eosinophils Absolute: 0.4 10*3/uL (ref 0.0–0.7)
HEMATOCRIT: 28 % — AB (ref 36.0–46.0)
Hemoglobin: 9 g/dL — ABNORMAL LOW (ref 12.0–15.0)
LYMPHS PCT: 36 %
Lymphs Abs: 2.7 10*3/uL (ref 0.7–4.0)
MCH: 28.3 pg (ref 26.0–34.0)
MCHC: 32.1 g/dL (ref 30.0–36.0)
MCV: 88.1 fL (ref 78.0–100.0)
MONO ABS: 0.9 10*3/uL (ref 0.1–1.0)
MONOS PCT: 12 %
Neutro Abs: 3.4 10*3/uL (ref 1.7–7.7)
Neutrophils Relative %: 46 %
PLATELETS: 253 10*3/uL (ref 150–400)
RBC: 3.18 MIL/uL — ABNORMAL LOW (ref 3.87–5.11)
RDW: 16 % — AB (ref 11.5–15.5)
WBC: 7.5 10*3/uL (ref 4.0–10.5)

## 2017-01-08 ENCOUNTER — Encounter (HOSPITAL_COMMUNITY)
Admission: RE | Admit: 2017-01-08 | Discharge: 2017-01-08 | Disposition: A | Discharge status: Home / Self Care | Payer: PPO | Source: Skilled Nursing Facility | Attending: Internal Medicine | Admitting: Internal Medicine

## 2017-01-08 ENCOUNTER — Other Ambulatory Visit (INDEPENDENT_AMBULATORY_CARE_PROVIDER_SITE_OTHER): Payer: Self-pay

## 2017-01-08 ENCOUNTER — Ambulatory Visit (INDEPENDENT_AMBULATORY_CARE_PROVIDER_SITE_OTHER): Payer: PPO | Admitting: Physician Assistant

## 2017-01-08 DIAGNOSIS — M25562 Pain in left knee: Secondary | ICD-10-CM

## 2017-01-08 DIAGNOSIS — T8459XS Infection and inflammatory reaction due to other internal joint prosthesis, sequela: Secondary | ICD-10-CM

## 2017-01-08 DIAGNOSIS — Z96659 Presence of unspecified artificial knee joint: Secondary | ICD-10-CM

## 2017-01-08 LAB — BASIC METABOLIC PANEL
ANION GAP: 12 (ref 5–15)
BUN: 72 mg/dL — AB (ref 6–20)
CHLORIDE: 99 mmol/L — AB (ref 101–111)
CO2: 25 mmol/L (ref 22–32)
Calcium: 9.5 mg/dL (ref 8.9–10.3)
Creatinine, Ser: 5.26 mg/dL — ABNORMAL HIGH (ref 0.44–1.00)
GFR, EST AFRICAN AMERICAN: 8 mL/min — AB (ref >60)
GFR, EST NON AFRICAN AMERICAN: 7 mL/min — AB (ref >60)
Glucose, Bld: 103 mg/dL — ABNORMAL HIGH (ref 65–99)
POTASSIUM: 4.3 mmol/L (ref 3.5–5.1)
SODIUM: 136 mmol/L (ref 135–145)

## 2017-01-08 MED ORDER — NAPROXEN 500 MG PO TABS: 500.0000 mg | ORAL_TABLET | Freq: Two times a day (BID) | ORAL | 1 refills | Status: DC

## 2017-01-08 NOTE — Progress Notes (Signed)
Office Visit Note   Patient: Laura Chambers           Date of Birth: 1940-11-28           MRN: [id number] Visit Date: 01/08/2017              Requested by: Lelon Perla, MD [address] [place], [place] [address] PCP: Lelon Perla, MD   Assessment & Plan: Visit Diagnoses:  1. Acute pain of left knee   2. Swelling of left knee joint     Plan:  Left knee was prepped with Betadine and ethyl chloride she senescent skin and then 3 mL lidocaine was used to further anesthetize skin and aspiration was performed approximately 10 mL of bloody synovial fluid was obtained. This will be sent for culture sensitivity. We also drew a sedimentation rate and CRP and CBC on her today. I have her follow-up in 2 weeks to go over results of these labs. At time we'll discuss excision of the antibiotic spacer and revision left total knee timing.  Follow-Up Instructions: Return in about 6 weeks (around 02/19/2017).   Orders:  Orders Placed This Encounter  Procedures  . Body fluid culture  . CBC with Differential  . C-reactive protein  . Sed Rate (ESR)   Meds ordered this encounter  Medications  . naproxen (NAPROSYN) 500 MG tablet    Sig: Take 1 tablet (500 mg total) by mouth 2 (two) times daily with a meal.    Dispense:  60 tablet    Refill:  1      Procedures: No procedures performed   Clinical Data: No additional findings.   Subjective: Chief Complaint  Patient presents with  . Left Knee - Follow-up    HPI Mrs. Curley returns today 2 have labs drawn and aspirate drawn for excision of the left total knee and revision of left knee. Since that she was last seen here in the office she was hospitalized due to acute renal failure. She's taken off all antibiotics at this point in time. She is now 6 weeks status post excision of the primary left total knee arthroplasty with antibiotic spacer placement. She denies any fevers. She does state she states cold all times.  Overall doing well having very little if any pain. She is wearing a knee immobilizer. Left knee cultures had no growth.  Review of Systems Please see history of present illness  Objective: Vital Signs: There were no vitals taken for this visit.  Physical Exam  Constitutional: She is oriented to person, place, and time. She appears well-developed and well-nourished. No distress.  Pulmonary/Chest: Effort normal.  Neurological: She is alert and oriented to person, place, and time.  Skin: She is not diaphoretic.  Psychiatric: She has a normal mood and affect. Her behavior is normal.    Ortho Exam Left knee no significant effusion. Surgical incisions well-healed no signs of gross infection. Slight warmth of the left knee compared to the lower leg. No erythema. Specialty Comments:  No specialty comments available.  Imaging: No results found.   PMFS History: Patient Active Problem List   Diagnosis Date Noted  . Obesity, Class III, BMI 40-49.9 (morbid obesity) ([place]) 12/28/2016  . Acute metabolic encephalopathy 50/09/7046  . Normocytic anemia 12/16/2016  . AKI (acute kidney injury) ([place]) 12/15/2016  . Essential hypertension 12/08/2016  . 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 Urinary retention 12/02/2016  . Cellulitis of left lower leg 11/12/2016  .  Knee pain, left 11/11/2016  . Left leg swelling 11/10/2016  . Pain in left leg 11/10/2016  . Presence of left artificial knee joint 11/10/2016  . Trigeminal neuralgia 08/19/2015  . Musculoskeletal neck pain 08/19/2015  . Peripheral neuropathy 01/12/2014  . Hyperglycemia 06/16/2012  . 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 Dyslipidemia 06/16/2012  . Globus sensation 02/04/2012  . Nausea 02/04/2012  . Bowel habit changes 02/04/2012  . Pyrosis 02/04/2012   Past Medical History:  Diagnosis Date  . Anxiety   . Arthritis   . Complication of anesthesia 2012   "irritated trachea" with  cough x 1 year from intubation  . Esophageal hernia   . Fibromyalgia   . HTN (hypertension)   . Hx of colonic polyps   . Hydronephrosis of right kidney   . Hypercholesterolemia   . Neuropathy due to medical condition ([place])    bilateral lower legs  . Obesity   . 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 Renal angiomyolipoma     Family History  Problem Relation Age of Onset  . Diabetes Father   . Osteoarthritis Father   . Heart disease Father   . Stroke Father   . Osteoarthritis Mother   . Stroke Mother   . Pneumonia Mother   . Colon cancer Sister 31  . Colon polyps Unknown        neice/nephew  . Diabetes Sister   . Diabetes Brother     Past Surgical History:  Procedure Laterality Date  . ABDOMINAL HYSTERECTOMY  1969  . BACK SURGERY  2012   rod in back-  lumbar disc removal with bone graft  . blephoroplasty     bilateral  . BREAST BIOPSY  1989  . CHOLECYSTECTOMY  2010  . EXCISIONAL TOTAL KNEE ARTHROPLASTY WITH ANTIBIOTIC SPACERS Left 11/14/2016   Procedure: EXCISIONAL LEFT TOTAL KNEE ARTHROPLASTY WITH  PLACEMENT OF ANTIBIOTIC SPACERS;  Surgeon: [name], MD;  Location: [facility];  Service: Orthopedics;  Laterality: Left;  . NISSEN FUNDOPLICATION    . PARAESOPHAGEAL HERNIA REPAIR  1989  . TONSILLECTOMY  1946  . TOTAL HIP ARTHROPLASTY Right 08/31/2012   Procedure: TOTAL HIP ARTHROPLASTY ANTERIOR APPROACH;  Surgeon: Mauri Pole, MD;  Location: [facility];  Service: Orthopedics;  Laterality: Right;  . TOTAL KNEE ARTHROPLASTY  2001   left  . TOTAL KNEE ARTHROPLASTY  2010   right   Social History   Occupational History  .  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 Smoking status: Never Smoker  . Smokeless tobacco: Never Used  . Alcohol use No  . Drug use: No  . Sexual activity: No

## 2017-01-09 LAB — CBC WITH DIFFERENTIAL/PLATELET
BASOS ABS: 80 {cells}/uL (ref 0–200)
Basophils Relative: 1 %
EOS PCT: 4 %
Eosinophils Absolute: 320 cells/uL (ref 15–500)
HCT: 31 % — ABNORMAL LOW (ref 35.0–45.0)
Hemoglobin: 9.9 g/dL — ABNORMAL LOW (ref 11.7–15.5)
LYMPHS ABS: 2560 {cells}/uL (ref 850–3900)
LYMPHS PCT: 32 %
MCH: 28 pg (ref 27.0–33.0)
MCHC: 31.9 g/dL — AB (ref 32.0–36.0)
MCV: 87.8 fL (ref 80.0–100.0)
MONOS PCT: 13 %
MPV: 11.3 fL (ref 7.5–12.5)
Monocytes Absolute: 1040 cells/uL — ABNORMAL HIGH (ref 200–950)
NEUTROS PCT: 50 %
Neutro Abs: 4000 cells/uL (ref 1500–7800)
PLATELETS: 322 10*3/uL (ref 140–400)
RBC: 3.53 MIL/uL — AB (ref 3.80–5.10)
RDW: 15.7 % — AB (ref 11.0–15.0)
WBC: 8 10*3/uL (ref 3.8–10.8)

## 2017-01-09 LAB — C-REACTIVE PROTEIN: CRP: 5.7 mg/L (ref <8.0)

## 2017-01-09 LAB — SEDIMENTATION RATE: SED RATE: 53 mm/h — AB (ref 0–30)

## 2017-01-12 ENCOUNTER — Non-Acute Institutional Stay (SKILLED_NURSING_FACILITY): Payer: PPO | Admitting: Internal Medicine

## 2017-01-12 ENCOUNTER — Encounter: Payer: Self-pay | Admitting: Internal Medicine

## 2017-01-12 ENCOUNTER — Encounter (HOSPITAL_COMMUNITY)
Admission: RE | Admit: 2017-01-12 | Discharge: 2017-01-12 | Disposition: A | Discharge status: Home / Self Care | Payer: PPO | Source: Skilled Nursing Facility | Attending: Internal Medicine | Admitting: Internal Medicine

## 2017-01-12 DIAGNOSIS — D649 Anemia, unspecified: Secondary | ICD-10-CM | POA: Diagnosis not present

## 2017-01-12 DIAGNOSIS — T8459XS Infection and inflammatory reaction due to other internal joint prosthesis, sequela: Secondary | ICD-10-CM | POA: Diagnosis not present

## 2017-01-12 DIAGNOSIS — I10 Essential (primary) hypertension: Secondary | ICD-10-CM | POA: Diagnosis not present

## 2017-01-12 DIAGNOSIS — Z96659 Presence of unspecified artificial knee joint: Secondary | ICD-10-CM

## 2017-01-12 DIAGNOSIS — N179 Acute kidney failure, unspecified: Secondary | ICD-10-CM | POA: Diagnosis not present

## 2017-01-12 DIAGNOSIS — G9341 Metabolic encephalopathy: Secondary | ICD-10-CM

## 2017-01-12 LAB — FERRITIN: FERRITIN: 387 ng/mL — AB (ref 11–307)

## 2017-01-12 LAB — CBC
HCT: 28.1 % — ABNORMAL LOW (ref 36.0–46.0)
Hemoglobin: 9.1 g/dL — ABNORMAL LOW (ref 12.0–15.0)
MCH: 28.9 pg (ref 26.0–34.0)
MCHC: 32.4 g/dL (ref 30.0–36.0)
MCV: 89.2 fL (ref 78.0–100.0)
Platelets: 259 10*3/uL (ref 150–400)
RBC: 3.15 MIL/uL — ABNORMAL LOW (ref 3.87–5.11)
RDW: 15.5 % (ref 11.5–15.5)
WBC: 8.4 10*3/uL (ref 4.0–10.5)

## 2017-01-12 LAB — RENAL FUNCTION PANEL
Albumin: 3.3 g/dL — ABNORMAL LOW (ref 3.5–5.0)
Anion gap: 11 (ref 5–15)
BUN: 74 mg/dL — AB (ref 6–20)
CHLORIDE: 103 mmol/L (ref 101–111)
CO2: 26 mmol/L (ref 22–32)
Calcium: 9.3 mg/dL (ref 8.9–10.3)
Creatinine, Ser: 5.64 mg/dL — ABNORMAL HIGH (ref 0.44–1.00)
GFR, EST AFRICAN AMERICAN: 8 mL/min — AB (ref >60)
GFR, EST NON AFRICAN AMERICAN: 7 mL/min — AB (ref >60)
Glucose, Bld: 93 mg/dL (ref 65–99)
POTASSIUM: 4.2 mmol/L (ref 3.5–5.1)
Phosphorus: 6.5 mg/dL — ABNORMAL HIGH (ref 2.5–4.6)
Sodium: 140 mmol/L (ref 135–145)

## 2017-01-12 LAB — IRON AND TIBC
IRON: 73 ug/dL (ref 28–170)
SATURATION RATIOS: 26 % (ref 10.4–31.8)
TIBC: 279 ug/dL (ref 250–450)
UIBC: 206 ug/dL

## 2017-01-12 NOTE — Progress Notes (Signed)
Provider:  Veleta Miners Location:   [facility] Room Number: 128/P Place of Service:  SNF (31)  PCP: Lelon Perla, MD Patient Care Team: Lelon Perla, MD as PCP - General (Cardiology)  Extended Emergency Contact Information Primary Emergency Contact: Tish Frederickson, [place] [address] [name] of [place] Phone: [phone] Relation: Son Secondary Emergency Contact: Tollie Eth [place] of [place] Mobile Phone: [phone] Relation: Son  Code Status: Full Code Goals of Care: Advanced Directive information Advanced Directives 01/12/2017  Does Patient Have a Medical Advance Directive? Yes  Type of Advance Directive (No Data)  Does patient want to make changes to medical advance directive? No - Patient declined  Would patient like information on creating a medical advance directive? No - Patient declined  Pre-existing out of facility DNR order (yellow form or pink MOST form) -      Chief Complaint  Patient presents with  . Readmit To SNF    HPI: Patient is a 76 y.o. female seen today for Readmission to SNF for therapy after hospitalization for  Acute renal injury Patient with h/o Hypertension, hyperlipidemia and Left total Knee arthroplasty in 2001  She was initially admitted to [facility] for therapy after She underwent Excisional Left Total Knee Arthroplasty with Placement of Antibiotic Spacer on 11/11/16.for septic Joint. She was on IV Cefepime for 4 weeks till 06/20. Initially she had some problems with urinary retention which got resolved. But then her creat started to go up and she was send to the hospital. Her creat had peaked to 5.65 in the hospital. Her renal US was negative for any Hydronephrosis. 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 Patient did not get biopsy. Her overall renal function did stabilized.  She also had Acute Encephalopathy in the hospital. She had extensive  work up including CT scan of head. Her encephalopathy resolved itself. She also went into Pulmonary edema due to her renal function and is now on Lasix. Her Echo showed diastolic dysfunction. She was seen by Kurtis Bushman and her joint aspirated again. Waiting for cultures. Patient has been off Antibiotics since 12/25/16  Patient is doing well in facility. She is not having pain in her knee. She says she need something for her nerves and wanted to see if she can use xanax at night as needed. She is working with therapy and is able to now hop on her right leg with walker.  Past Medical History:  Diagnosis Date  . Anxiety   . Arthritis   . Complication of anesthesia 2012   "irritated trachea" with cough x 1 year from intubation  . Esophageal hernia   . Fibromyalgia   . HTN (hypertension)   . Hx of colonic polyps   . Hydronephrosis of right kidney   . Hypercholesterolemia   . 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 Obesity   . 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 Renal angiomyolipoma    Past Surgical History:  Procedure Laterality Date  . ABDOMINAL HYSTERECTOMY  1969  . BACK SURGERY  2012   rod in back-  lumbar disc removal with bone graft  . blephoroplasty     bilateral  . BREAST BIOPSY  1989  . CHOLECYSTECTOMY  2010  . EXCISIONAL TOTAL KNEE ARTHROPLASTY WITH ANTIBIOTIC SPACERS Left 11/14/2016   Procedure: EXCISIONAL LEFT TOTAL KNEE  ARTHROPLASTY WITH  PLACEMENT OF ANTIBIOTIC SPACERS;  Surgeon: [name], MD;  Location: [facility];  Service: Orthopedics;  Laterality: Left;  . NISSEN FUNDOPLICATION    . PARAESOPHAGEAL HERNIA REPAIR  1989  . TONSILLECTOMY  1946  . TOTAL HIP ARTHROPLASTY Right 08/31/2012   Procedure: TOTAL HIP ARTHROPLASTY ANTERIOR APPROACH;  Surgeon: Mauri Pole, MD;  Location: [facility];  Service: Orthopedics;  Laterality: Right;  . TOTAL KNEE ARTHROPLASTY  2001   left  . TOTAL KNEE ARTHROPLASTY   2010   right    reports that she has never smoked. She has never used smokeless tobacco. She reports that she does not drink alcohol or use drugs. Social History   Social History  . Marital status: Divorced    Spouse name: N/A  . Number of children: 3  . Years of education: College   Occupational History  .  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 Smoking status: Never Smoker  . Smokeless tobacco: Never Used  . Alcohol use No  . Drug use: No  . Sexual activity: No   Other Topics Concern  . Not on file   Social History Narrative   Patient lives at home alone.   Caffeine Use: 1 cup daily    Functional Status Survey:    Family History  Problem Relation Age of Onset  . Diabetes Father   . Osteoarthritis Father   . Heart disease Father   . Stroke Father   . Osteoarthritis Mother   . Stroke Mother   . Pneumonia Mother   . Colon cancer Sister 52  . Colon polyps Unknown        neice/nephew  . Diabetes Sister   . Diabetes Brother     Health Maintenance  Topic Date Due  . Samul Dada  05/[birth year]/1959  . DEXA SCAN  05/18/2005  . PNA vac Low Risk Adult (1 of 2 - PCV13) 06/06/2017 (Originally 05/18/2005)  . INFLUENZA VACCINE  02/04/2017  . COLONOSCOPY  01/26/2019    Allergies  Allergen Reactions  . Epinephrine Other (See Comments)    Reaction:  Increased pts BP and caused shoulder to jerk   . Levofloxacin Nausea And Vomiting  . Lipitor [Atorvastatin] Other (See Comments)    Reaction:  Joint pain and dizziness   . Trovan [Alatrofloxacin] Nausea And Vomiting    Pt states that this med caused pancreatitis.    Outpatient Encounter Prescriptions as of 01/12/2017  Medication Sig  . amLODipine (NORVASC) 10 MG tablet Take 1 tablet (10 mg total) by mouth daily.  Marland Kitchen atenolol (TENORMIN) 50 MG tablet Take 1 tablet (50 mg total) by mouth daily.  . Calcium Carbonate-Vitamin D (CALCIUM 600+D) 600-400 MG-UNIT tablet Take 1 tablet by mouth every other  day.  . docusate sodium (COLACE) 100 MG capsule Take 1 capsule (100 mg total) by mouth 2 (two) times daily.  . folic acid (FOLVITE) 270 MCG tablet Take 400 mcg by mouth daily.  . furosemide (LASIX) 40 MG tablet Take 1 tablet (40 mg total) by mouth daily as needed for edema.  . gabapentin (NEURONTIN) 100 MG capsule Take 1 capsule (100 mg total) by mouth 2 (two) times daily.  Marland Kitchen HYDROcodone-acetaminophen (NORCO/VICODIN) 5-325 MG tablet Take 1 tablet by mouth every 6 (six) hours as needed for moderate pain. DO NOT EXCEED 3GM OF APAP IN 24 HOURS FROM ALL SOURCES  . Menthol, Topical Analgesic, (BIOFREEZE) 4 % GEL Add Biofreeze  to BLE for pain management once a day  . Multiple Vitamin (MULTIVITAMIN WITH MINERALS) TABS tablet Take 1 tablet by mouth every other day.  . promethazine (PHENERGAN) 25 MG tablet Take 25 mg by mouth every 8 (eight) hours as needed for nausea or vomiting.   . rosuvastatin (CRESTOR) 10 MG tablet Take 1 tablet (10 mg total) by mouth daily.  . tamsulosin (FLOMAX) 0.4 MG CAPS capsule Take 0.4 mg by mouth every evening.   No facility-administered encounter medications on file as of 01/12/2017.      Review of Systems  Constitutional: Negative for activity change, appetite change, chills, diaphoresis, fatigue and fever.  HENT: Negative.   Eyes: Negative.   Respiratory: Negative.   Cardiovascular: Positive for leg swelling. Negative for chest pain and palpitations.  Gastrointestinal: Positive for nausea. Negative for abdominal distention, constipation, diarrhea and vomiting.  Genitourinary: Negative.   Musculoskeletal: Negative.   Skin: Negative.   Neurological: Negative.   Psychiatric/Behavioral: Positive for sleep disturbance. Negative for agitation, behavioral problems and confusion. The patient is nervous/anxious.     Vitals:   01/12/17 1727  BP: 134/60  Pulse: 70  Resp: 20  Temp: 98 F (36.7 C)   There is no height or weight on file to calculate BMI. Physical Exam    Constitutional: She is oriented to person, place, and time. She appears well-developed and well-nourished.  HENT:  Head: Normocephalic.  Mouth/Throat: Oropharynx is clear and moist.  Eyes: Pupils are equal, round, and reactive to light.  Neck: Neck supple.  Cardiovascular: Normal rate, regular rhythm and normal heart sounds.   No murmur heard. Pulmonary/Chest: Effort normal and breath sounds normal. No respiratory distress. She has no wheezes. She has no rales.  Abdominal: Soft. Bowel sounds are normal. She exhibits no distension. There is no tenderness. There is no rebound.  Musculoskeletal:  Moderate edema b/l  Neurological: She is alert and oriented to person, place, and time.  No focal deficits  Skin: Skin is warm and dry.  Psychiatric: Her speech is normal and behavior is normal. Thought content normal. Her mood appears anxious. Cognition and memory are normal.    Labs reviewed: Basic Metabolic Panel:  Recent Labs  12/31/16 0550 01/01/17 0601  01/06/17 0500 01/08/17 0945 01/12/17 0715  NA 137 139  < > 136 136 140  K 4.0 4.2  < > 4.2 4.3 4.2  CL 98* 99*  < > 100* 99* 103  CO2 30 28  < > 24 25 26   GLUCOSE 92 107*  < > 96 103* 93  BUN 67* 67*  < > 79* 72* 74*  CREATININE 5.03* 5.05*  < > 5.66* 5.26* 5.64*  CALCIUM 8.3* 8.8*  < > 8.7* 9.5 9.3  PHOS 4.2 4.1  --   --   --  6.5*  < > = values in this interval not displayed. Liver Function Tests:  Recent Labs  04/15/16 1145 12/16/16 0609  12/31/16 0550 01/01/17 0601 01/12/17 0715  AST 19 18  --   --   --   --   ALT 11 13*  --   --   --   --   ALKPHOS 82 86  --   --   --   --   BILITOT 1.4* 0.5  --   --   --   --   PROT 6.5 6.6  --   --   --   --   ALBUMIN 3.7 2.5*  < > 2.8* 3.3*  3.3*  < > = values in this interval not displayed. No results for input(s): LIPASE, AMYLASE in the last 8760 hours.  Recent Labs  12/24/16 1353 12/26/16 1850  AMMONIA 20 19   CBC:  Recent Labs  12/15/16 1350  01/06/17 0500  01/08/17 1701 01/12/17 0715  WBC 7.8  < > 7.5 8.0 8.4  NEUTROABS 4.6  --  3.4 4,000  --   HGB 9.9*  < > 9.0* 9.9* 9.1*  HCT 31.0*  < > 28.0* 31.0* 28.1*  MCV 88.6  < > 88.1 87.8 89.2  PLT 214  < > 253 322 259  < > = values in this interval not displayed. Cardiac Enzymes:  Recent Labs  12/24/16 1353  CKTOTAL 35*   BNP: Invalid input(s): POCBNP No results found for: HGBA1C Lab Results  Component Value Date   TSH 2.329 12/24/2016   Lab Results  Component Value Date   VITAMINB12 299 12/24/2016   No results found for: FOLATE Lab Results  Component Value Date   IRON 73 01/12/2017   TIBC 279 01/12/2017   FERRITIN 387 (H) 01/12/2017    Imaging and Procedures obtained prior to SNF admission: No results found.  Assessment/Plan AKI (acute kidney injury) Followed by Nephrology. Getting renal function checked everyother day as per renal Creat stable so far. She continues on Lasix.  Acute metabolic encephalopathy Mostly resolved.'Patient back to baseline in the facility  Essential hypertension BP controlled on Atenolol and amlodipine.   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 Had knee aspirated yesterday. Waiting for the results. If negative for infection plan is for redo of the knee. Continue to be non weight bearing on that leg.  Normocytic anemia HGB stable. Thought to be due to renal diseses.  Anxiety Will restart her on 0.25 mg of Xanax PRN at Night. H/o urinary retension Continue Flomax for now. Hyperlipidemia On Crestor.  Family/ staff Communication:   Labs/tests ordered: Total time spent in this patient care encounter was 45_ minutes; greater than 50% of the visit spent counseling patient and coordinating care for problems addressed at this encounter.

## 2017-01-13 LAB — BODY FLUID CULTURE
GRAM STAIN: NONE SEEN
Organism ID, Bacteria: NO GROWTH

## 2017-01-13 NOTE — Progress Notes (Signed)
This encounter was created in error - please disregard.

## 2017-01-16 ENCOUNTER — Encounter (HOSPITAL_COMMUNITY)
Admission: RE | Admit: 2017-01-16 | Discharge: 2017-01-16 | Disposition: A | Discharge status: Home / Self Care | Payer: PPO | Source: Skilled Nursing Facility | Attending: *Deleted | Admitting: *Deleted

## 2017-01-16 LAB — BASIC METABOLIC PANEL
Anion gap: 10 (ref 5–15)
BUN: 67 mg/dL — AB (ref 6–20)
CALCIUM: 9.5 mg/dL (ref 8.9–10.3)
CO2: 28 mmol/L (ref 22–32)
CREATININE: 5.04 mg/dL — AB (ref 0.44–1.00)
Chloride: 102 mmol/L (ref 101–111)
GFR calc Af Amer: 9 mL/min — ABNORMAL LOW (ref >60)
GFR, EST NON AFRICAN AMERICAN: 8 mL/min — AB (ref >60)
GLUCOSE: 93 mg/dL (ref 65–99)
Potassium: 4.4 mmol/L (ref 3.5–5.1)
Sodium: 140 mmol/L (ref 135–145)

## 2017-01-19 ENCOUNTER — Encounter (HOSPITAL_COMMUNITY)
Admission: RE | Admit: 2017-01-19 | Discharge: 2017-01-19 | Disposition: A | Discharge status: Home / Self Care | Payer: PPO | Source: Skilled Nursing Facility | Attending: *Deleted | Admitting: *Deleted

## 2017-01-19 LAB — BASIC METABOLIC PANEL
Anion gap: 9 (ref 5–15)
BUN: 69 mg/dL — ABNORMAL HIGH (ref 6–20)
CO2: 27 mmol/L (ref 22–32)
Calcium: 9 mg/dL (ref 8.9–10.3)
Chloride: 102 mmol/L (ref 101–111)
Creatinine, Ser: 4.83 mg/dL — ABNORMAL HIGH (ref 0.44–1.00)
GFR calc Af Amer: 9 mL/min — ABNORMAL LOW (ref >60)
GFR, EST NON AFRICAN AMERICAN: 8 mL/min — AB (ref >60)
Glucose, Bld: 94 mg/dL (ref 65–99)
POTASSIUM: 3.9 mmol/L (ref 3.5–5.1)
Sodium: 138 mmol/L (ref 135–145)

## 2017-01-21 ENCOUNTER — Encounter (HOSPITAL_COMMUNITY)
Admission: RE | Admit: 2017-01-21 | Discharge: 2017-01-21 | Disposition: A | Discharge status: Home / Self Care | Payer: PPO | Source: Skilled Nursing Facility | Attending: *Deleted | Admitting: *Deleted

## 2017-01-21 LAB — BASIC METABOLIC PANEL
ANION GAP: 11 (ref 5–15)
BUN: 63 mg/dL — ABNORMAL HIGH (ref 6–20)
CALCIUM: 9 mg/dL (ref 8.9–10.3)
CO2: 27 mmol/L (ref 22–32)
CREATININE: 4.56 mg/dL — AB (ref 0.44–1.00)
Chloride: 101 mmol/L (ref 101–111)
GFR, EST AFRICAN AMERICAN: 10 mL/min — AB (ref >60)
GFR, EST NON AFRICAN AMERICAN: 9 mL/min — AB (ref >60)
Glucose, Bld: 95 mg/dL (ref 65–99)
Potassium: 4 mmol/L (ref 3.5–5.1)
SODIUM: 139 mmol/L (ref 135–145)

## 2017-01-21 NOTE — Progress Notes (Signed)
Your patient 

## 2017-01-22 ENCOUNTER — Non-Acute Institutional Stay (SKILLED_NURSING_FACILITY): Payer: PPO | Admitting: Internal Medicine

## 2017-01-22 ENCOUNTER — Ambulatory Visit (INDEPENDENT_AMBULATORY_CARE_PROVIDER_SITE_OTHER): Payer: PPO | Admitting: Orthopaedic Surgery

## 2017-01-22 ENCOUNTER — Encounter: Payer: Self-pay | Admitting: Internal Medicine

## 2017-01-22 DIAGNOSIS — T8459XS Infection and inflammatory reaction due to other internal joint prosthesis, sequela: Secondary | ICD-10-CM

## 2017-01-22 DIAGNOSIS — G9341 Metabolic encephalopathy: Secondary | ICD-10-CM | POA: Diagnosis not present

## 2017-01-22 DIAGNOSIS — Z96659 Presence of unspecified artificial knee joint: Secondary | ICD-10-CM

## 2017-01-22 DIAGNOSIS — N179 Acute kidney failure, unspecified: Secondary | ICD-10-CM | POA: Diagnosis not present

## 2017-01-22 DIAGNOSIS — E785 Hyperlipidemia, unspecified: Secondary | ICD-10-CM | POA: Diagnosis not present

## 2017-01-22 DIAGNOSIS — R339 Retention of urine, unspecified: Secondary | ICD-10-CM | POA: Diagnosis not present

## 2017-01-22 NOTE — Progress Notes (Deleted)
Location:    [facility] Room Number: 128/P Place of Service:  SNF (31) Provider: Lorne Skeens  PCP: Lelon Perla, MD Patient Care Team: Lelon Perla, MD as PCP - General (Cardiology)  Extended Emergency Contact Information Primary Emergency Contact: Tish Frederickson, [place] [address] [name] of [place] Phone: [phone] Relation: Son Secondary Emergency Contact: Tollie Eth [place] of [place] Mobile Phone: [phone] Relation: Son  Code Status: Full Code Goals of care:  Advanced Directive information Advanced Directives 01/22/2017  Does Patient Have a Medical Advance Directive? Yes  Type of Advance Directive (No Data)  Does patient want to make changes to medical advance directive? No - Patient declined  Would patient like information on creating a medical advance directive? No - Patient declined  Pre-existing out of facility DNR order (yellow form or pink MOST form) -     Allergies  Allergen Reactions  . Epinephrine Other (See Comments)    Reaction:  Increased pts BP and caused shoulder to jerk   . Levofloxacin Nausea And Vomiting  . Lipitor [Atorvastatin] Other (See Comments)    Reaction:  Joint pain and dizziness   . Trovan [Alatrofloxacin] Nausea And Vomiting    Pt states that this med caused pancreatitis.    Chief Complaint  Patient presents with  . Discharge Note    HPI:  76 y.o. female      Past Medical History:  Diagnosis Date  . Anxiety   . Arthritis   . Complication of anesthesia 2012   "irritated trachea" with cough x 1 year from intubation  . Esophageal hernia   . Fibromyalgia   . HTN (hypertension)   . Hx of colonic polyps   . Hydronephrosis of right kidney   . Hypercholesterolemia   . 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 Obesity   . 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 Renal angiomyolipoma     Past Surgical  History:  Procedure Laterality Date  . ABDOMINAL HYSTERECTOMY  1969  . BACK SURGERY  2012   rod in back-  lumbar disc removal with bone graft  . blephoroplasty     bilateral  . BREAST BIOPSY  1989  . CHOLECYSTECTOMY  2010  . EXCISIONAL TOTAL KNEE ARTHROPLASTY WITH ANTIBIOTIC SPACERS Left 11/14/2016   Procedure: EXCISIONAL LEFT TOTAL KNEE ARTHROPLASTY WITH  PLACEMENT OF ANTIBIOTIC SPACERS;  Surgeon: [name], MD;  Location: [facility];  Service: Orthopedics;  Laterality: Left;  . NISSEN FUNDOPLICATION    . PARAESOPHAGEAL HERNIA REPAIR  1989  . TONSILLECTOMY  1946  . TOTAL HIP ARTHROPLASTY Right 08/31/2012   Procedure: TOTAL HIP ARTHROPLASTY ANTERIOR APPROACH;  Surgeon: Mauri Pole, MD;  Location: [facility];  Service: Orthopedics;  Laterality: Right;  . TOTAL KNEE ARTHROPLASTY  2001   left  . TOTAL KNEE ARTHROPLASTY  2010   right      reports that she has never smoked. She has never used smokeless tobacco. She reports that she does not drink alcohol or use drugs. Social History   Social History  . Marital status: Divorced    Spouse name: N/A  . Number of children: 3  . Years of education: College   Occupational History  .  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 Smoking status: Never Smoker  .  Smokeless tobacco: Never Used  . Alcohol use No  . Drug use: No  . Sexual activity: No   Other Topics Concern  . Not on file   Social History Narrative   Patient lives at home alone.   Caffeine Use: 1 cup daily   Functional Status Survey:    Allergies  Allergen Reactions  . Epinephrine Other (See Comments)    Reaction:  Increased pts BP and caused shoulder to jerk   . Levofloxacin Nausea And Vomiting  . Lipitor [Atorvastatin] Other (See Comments)    Reaction:  Joint pain and dizziness   . Trovan [Alatrofloxacin] Nausea And Vomiting    Pt states that this med caused pancreatitis.    Pertinent  Health Maintenance Due  Topic Date Due    . DEXA SCAN  05/18/2005  . PNA vac Low Risk Adult (1 of 2 - PCV13) 06/06/2017 (Originally 05/18/2005)  . INFLUENZA VACCINE  02/04/2017  . COLONOSCOPY  01/26/2019    Medications: Outpatient Encounter Prescriptions as of 01/22/2017  Medication Sig  . ALPRAZolam (XANAX) 0.25 MG tablet Take 0.25 mg by mouth at bedtime as needed for anxiety.  Marland Kitchen amLODipine (NORVASC) 10 MG tablet Take 1 tablet (10 mg total) by mouth daily.  Marland Kitchen atenolol (TENORMIN) 50 MG tablet Take 1 tablet (50 mg total) by mouth daily.  . Calcium Carbonate-Vitamin D (CALCIUM 600+D) 600-400 MG-UNIT tablet Take 1 tablet by mouth every other day.  . docusate sodium (COLACE) 100 MG capsule Take 1 capsule (100 mg total) by mouth 2 (two) times daily.  . folic acid (FOLVITE) 347 MCG tablet Take 400 mcg by mouth daily.  . furosemide (LASIX) 40 MG tablet Take 1 tablet (40 mg total) by mouth daily as needed for edema.  . gabapentin (NEURONTIN) 100 MG capsule Take 1 capsule (100 mg total) by mouth 2 (two) times daily.  Marland Kitchen HYDROcodone-acetaminophen (NORCO/VICODIN) 5-325 MG tablet Take 1 tablet by mouth every 6 (six) hours as needed for moderate pain. DO NOT EXCEED 3GM OF APAP IN 24 HOURS FROM ALL SOURCES  . Menthol, Topical Analgesic, (BIOFREEZE) 4 % GEL Add Biofreeze to BLE for pain management once a day  . Multiple Vitamin (MULTIVITAMIN WITH MINERALS) TABS tablet Take 1 tablet by mouth every other day.  . promethazine (PHENERGAN) 25 MG tablet Take 25 mg by mouth every 8 (eight) hours as needed for nausea or vomiting.   . rosuvastatin (CRESTOR) 10 MG tablet Take 1 tablet (10 mg total) by mouth daily.  . tamsulosin (FLOMAX) 0.4 MG CAPS capsule Take 0.4 mg by mouth every evening.   No facility-administered encounter medications on file as of 01/22/2017.      Review of Systems  There were no vitals filed for this visit. There is no height or weight on file to calculate BMI. Physical Exam  Labs reviewed: Basic Metabolic Panel:  Recent  Labs  12/31/16 0550 01/01/17 0601  01/12/17 0715 01/16/17 1045 01/19/17 0645 01/21/17 0730  NA 137 139  < > 140 140 138 139  K 4.0 4.2  < > 4.2 4.4 3.9 4.0  CL 98* 99*  < > 103 102 102 101  CO2 30 28  < > 26 28 27 27   GLUCOSE 92 107*  < > 93 93 94 95  BUN 67* 67*  < > 74* 67* 69* 63*  CREATININE 5.03* 5.05*  < > 5.64* 5.04* 4.83* 4.56*  CALCIUM 8.3* 8.8*  < > 9.3 9.5 9.0 9.0  PHOS 4.2 4.1  --  6.5*  --   --   --   < > = values in this interval not displayed. Liver Function Tests:  Recent Labs  04/15/16 1145 12/16/16 0609  12/31/16 0550 01/01/17 0601 01/12/17 0715  AST 19 18  --   --   --   --   ALT 11 13*  --   --   --   --   ALKPHOS 82 86  --   --   --   --   BILITOT 1.4* 0.5  --   --   --   --   PROT 6.5 6.6  --   --   --   --   ALBUMIN 3.7 2.5*  < > 2.8* 3.3* 3.3*  < > = values in this interval not displayed. No results for input(s): LIPASE, AMYLASE in the last 8760 hours.  Recent Labs  12/24/16 1353 12/26/16 1850  AMMONIA 20 19   CBC:  Recent Labs  12/15/16 1350  01/06/17 0500 01/08/17 1701 01/12/17 0715  WBC 7.8  < > 7.5 8.0 8.4  NEUTROABS 4.6  --  3.4 4,000  --   HGB 9.9*  < > 9.0* 9.9* 9.1*  HCT 31.0*  < > 28.0* 31.0* 28.1*  MCV 88.6  < > 88.1 87.8 89.2  PLT 214  < > 253 322 259  < > = values in this interval not displayed. Cardiac Enzymes:  Recent Labs  12/24/16 1353  CKTOTAL 35*   BNP: Invalid input(s): POCBNP CBG:  Recent Labs  12/17/16 1106 12/17/16 1641 12/27/16 0044  GLUCAP 103* 90 101*    Procedures and Imaging Studies During Stay: Dg Chest 1 View  Result Date: 12/26/2016 CLINICAL DATA:  Acute onset of shortness of breath. Acute renal insufficiency. Initial encounter. EXAM: CHEST 1 VIEW COMPARISON:  CTA of the chest performed 11/18/2016, and chest radiograph performed 08/25/2012 FINDINGS: The lungs are well-aerated. Mild vascular congestion is noted. Patchy bilateral airspace opacities, left greater than right, may reflect  pneumonia. The appearance is less typical for asymmetric interstitial edema. Small bilateral pleural effusions are suggested. No pneumothorax is seen. A left PICC is noted ending about the proximal SVC. The cardiomediastinal silhouette is mildly enlarged. No acute osseous abnormalities are seen. IMPRESSION: Mild vascular congestion and mild cardiomegaly. Patchy bilateral airspace opacities, left greater than right, may reflect pneumonia. The appearance is less typical for asymmetric interstitial edema. Small bilateral pleural effusions suggested. Electronically Signed   By: Garald Balding M.D.   On: 12/26/2016 19:40   Ct Head Wo Contrast  Result Date: 12/26/2016 CLINICAL DATA:  Confusion.  Altered mental status. EXAM: CT HEAD WITHOUT CONTRAST TECHNIQUE: Contiguous axial images were obtained from the base of the skull through the vertex without intravenous contrast. COMPARISON:  None. FINDINGS: Brain: No evidence of acute infarction, hemorrhage, hydrocephalus, extra-axial collection or mass lesion/mass effect. There is white matter hypoattenuation, which is most evident in the posterior white matter of the parietal and occipital lobes. This is fairly symmetric. The ventricles and sulci are mildly enlarged consistent with mild diffuse atrophy. Vascular: No hyperdense vessel or unexpected calcification. Skull: Normal. Negative for fracture or focal lesion. Sinuses/Orbits: Globes and orbits are unremarkable. Visualized sinuses and mastoid air cells are clear. Other: None. IMPRESSION: 1. There is white matter hypoattenuation that is prominent posteriorly. Although this is nonspecific, consider PRES. Followup brain MRI would be helpful to further characterize this appearance. 2. There is no evidence of a mass cysts, recent infarction or of  intracranial hemorrhage. Electronically Signed   By: Lajean Manes M.D.   On: 12/26/2016 20:39   Dg Chest Port 1 View  Result Date: 12/30/2016 CLINICAL DATA:  PICC line  placement. EXAM: PORTABLE CHEST 1 VIEW COMPARISON:  12/26/2016 and prior exams FINDINGS: Cardiomegaly and pulmonary vascular congestion again noted. Left basilar atelectasis/ scarring again noted. A left PICC line has been slightly retracted with tip now overlying the medial left subclavian vein. There is no evidence of pneumothorax. IMPRESSION: Left PICC line slightly retracted now with tip overlying the medial left subclavian vein. Slightly decreased pulmonary vascular congestion and improved bibasilar aeration. Electronically Signed   By: Margarette Canada M.D.   On: 12/30/2016 16:25    Assessment/Plan:     Future labs/tests needed:

## 2017-01-22 NOTE — Progress Notes (Signed)
Location:    [facility] Room Number: 128/P Place of Service:  SNF [phone]) Provider:  Scherrie November, [place]  Patient Care Team: Inda Coke, [place] as PCP - General (Physician Assistant)  Extended Emergency Contact Information Primary Emergency Contact: Tish Frederickson, [place] [address] [name] of [place] Phone: [phone] Mobile Phone: [phone] Relation: Son  Code Status:  Full Code Goals of care: Advanced Directive information Advanced Directives 01/22/2017  Does Patient Have a Medical Advance Directive? Yes  Type of Advance Directive (No Data)  Does patient want to make changes to medical advance directive? No - Patient declined  Would patient like information on creating a medical advance directive? No - Patient declined  Pre-existing out of facility DNR order (yellow form or pink MOST form) -     Chief complaint-discharge note  HPI:  Pt is a 76 y.o. female seen today for possible discharge later this week.  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  There is recommendation for revision of the arthroplasty at some point.  She actually saw her orthopedic surgeon today and at this point will delay the revision secondary to her renal function.  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  She did not get a biopsy apparently per her request.  Renal function did stabilize and has slowly improved she has been closely followed by nephrology here in [place] apparently this will be transferred to [facility] discharge.  She also had acute encephalopathy in  the hospital-workup did not show any neurologic changes per CT scan and encephalopathy eventually resolved.  Since her return from hospital she actually has been quite stable creatinine has gradually mildly improved it is 4.56 on most recent lab on the BUN of 63.  In regards to encephalopathy there's been no evidence of that here she is bright alert cheerful which is her baseline.  Her other medical issues continue to be stable she does have a history of anemia most recent hemoglobin was 9.1 is thought this is secondary to her renal issues.  She also has a history of urinary retention was to stabilize she is on Flomax.  She will be going home she will need PT and OT she does have strong family support as well she does have orthopedic follow-up-in will have nephrology follow-up in [facility]   Past Medical History:  Diagnosis Date  . Anxiety   . Arthritis   . Blood transfusion without reported diagnosis   . Complication of anesthesia 2012   "irritated trachea" with cough x 1 year from intubation  . Esophageal hernia   . Fibromyalgia   . HTN (hypertension)   . Hx of colonic polyps   . Hydronephrosis of right kidney   . Hypercholesterolemia   . 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 Obesity   . 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 Osteoporosis   . Renal angiomyolipoma    Past Surgical History:  Procedure Laterality Date  . ABDOMINAL HYSTERECTOMY  1969  . BACK SURGERY  2012   rod in back-  lumbar  disc removal with bone graft  . blephoroplasty     bilateral  . BREAST BIOPSY  1989  . CHOLECYSTECTOMY  2010  . EXCISIONAL TOTAL KNEE ARTHROPLASTY WITH ANTIBIOTIC SPACERS Left 11/14/2016   Procedure: EXCISIONAL LEFT TOTAL KNEE ARTHROPLASTY WITH  PLACEMENT OF ANTIBIOTIC SPACERS;  Surgeon: [name], MD;  Location: [facility];  Service: Orthopedics;  Laterality: Left;  . NISSEN FUNDOPLICATION    . PARAESOPHAGEAL HERNIA  REPAIR  1989  . TONSILLECTOMY  1946  . TOTAL HIP ARTHROPLASTY Right 08/31/2012   Procedure: TOTAL HIP ARTHROPLASTY ANTERIOR APPROACH;  Surgeon: Mauri Pole, MD;  Location: [facility];  Service: Orthopedics;  Laterality: Right;  . TOTAL KNEE ARTHROPLASTY  2001   left  . TOTAL KNEE ARTHROPLASTY  2010   right    Allergies  Allergen Reactions  . Ativan [Lorazepam] Other (See Comments)    Highly irritable  . Haldol [Haloperidol] Other (See Comments)    Vegetative State  . Epinephrine Other (See Comments)    Reaction:  Increased pts BP and caused shoulder to jerk   . Levofloxacin Nausea And Vomiting  . Lipitor [Atorvastatin] Other (See Comments)    Reaction:  Joint pain and dizziness   . Trovan [Alatrofloxacin] Nausea And Vomiting    Pt states that this med caused pancreatitis.    Outpatient Encounter Prescriptions as of 01/22/2017  Medication Sig  . ALPRAZolam (XANAX) 0.25 MG tablet Take 0.25 mg by mouth at bedtime as needed for anxiety.  Marland Kitchen amLODipine (NORVASC) 10 MG tablet Take 1 tablet (10 mg total) by mouth daily.  Marland Kitchen atenolol (TENORMIN) 50 MG tablet Take 1 tablet (50 mg total) by mouth daily.  . Calcium Carbonate-Vitamin D (CALCIUM 600+D) 600-400 MG-UNIT tablet Take 1 tablet by mouth every other day.  . docusate sodium (COLACE) 100 MG capsule Take 1 capsule (100 mg total) by mouth 2 (two) times daily. (Patient taking differently: Take 100 mg by mouth daily. )  . folic acid (FOLVITE) 220 MCG tablet Take 400 mcg by mouth daily.  . furosemide (LASIX) 40 MG tablet Take 1 tablet (40 mg total) by mouth daily as needed for edema.  . gabapentin (NEURONTIN) 100 MG capsule Take 1 capsule (100 mg total) by mouth 2 (two) times daily.  . Menthol, Topical Analgesic, (BIOFREEZE) 4 % GEL Add Biofreeze to BLE for pain management once a day  . Multiple Vitamin (MULTIVITAMIN WITH MINERALS) TABS tablet Take 1 tablet by mouth every other day.  . promethazine (PHENERGAN) 25 MG tablet Take 25 mg by mouth  every 8 (eight) hours as needed for nausea or vomiting.   . rosuvastatin (CRESTOR) 10 MG tablet Take 1 tablet (10 mg total) by mouth daily.  . tamsulosin (FLOMAX) 0.4 MG CAPS capsule Take 0.4 mg by mouth every evening.  . [DISCONTINUED] HYDROcodone-acetaminophen (NORCO/VICODIN) 5-325 MG tablet Take 1 tablet by mouth every 6 (six) hours as needed for moderate pain. DO NOT EXCEED 3GM OF APAP IN 24 HOURS FROM ALL SOURCES (Patient not taking: Reported on 01/27/2017)   No facility-administered encounter medications on file as of 01/22/2017.     Review of systems  In general does not complaining any fever or chills.  Skin does not complain of rashes or itching surgical site has well-healed scarring.  On the right knee.  It ears eyes nose mouth and throat does not complaining any visual changes or sore throat.  Respiratory denies shortness breath or cough.  Cardiac denies chest pain.  GI does not  complain of any abdominal discomfort nausea vomiting diarrhea or constipation.  GU does not complain currently of retention or dysuria.  Musculoskeletal does not complain actually of joint pain she does take hydrocodone for pain and apparently this is helping.  Neurologic is not complaining of dizziness headache or numbness.  Psych continues to be in good spirits does not complain of depression or anxiety.   Review of Systems   There is no immunization history on file for this patient. Pertinent  Health Maintenance Due  Topic Date Due  . DEXA SCAN  05/18/2005  . PNA vac Low Risk Adult (1 of 2 - PCV13) 06/06/2017 (Originally 05/18/2005)  . INFLUENZA VACCINE  02/04/2017  . COLONOSCOPY  01/26/2019   Fall Risk  01/27/2017  Falls in the past year? No   Functional Status Survey:  temperature is 98.9 pulse 69 respirations 19 blood pressure 140/70  Physical Exam      In general this is a pleasant elderly female who looks younger than her stated age. Sitting comfortably in her  wheelchair  Her skin is warm and dry. Surgical site right knee has well-healed scarring  Eyes pupils appear reactive light sclera and conjunctiva are clear visual acuity appears grossly intact.  Oropharynx is clear mucous membranes moist.   Chest is clear to auscultation there is no labored breathing.   Heart is regular rate and rhythm without murmur ur gallop or rub She appears to have ower extremity edema bilaterally iappears to be baseline  Her abdomen somewhat obese soft nontender with positive bowel sounds.  Musculoskeletal is able to move all extremities 4 limited left lower extremity since she has an immobilizer on is able to wiggle toes capillary refill is intact on the left this is baseline with previous exam.  Neurologic is grossly intact her speech is clear no lateralizing findings.  Psych she is alert oriented pleasant and appropriate. Labs reviewed:  Recent Labs  12/31/16 0550 01/01/17 0601  01/12/17 0715  01/19/17 0645 01/21/17 0730 01/23/17 0730  NA 137 139  < > 140  < > 138 139 137  K 4.0 4.2  < > 4.2  < > 3.9 4.0 3.6  CL 98* 99*  < > 103  < > 102 101 100*  CO2 30 28  < > 26  < > 27 27 26   GLUCOSE 92 107*  < > 93  < > 94 95 140*  BUN 67* 67*  < > 74*  < > 69* 63* 62*  CREATININE 5.03* 5.05*  < > 5.64*  < > 4.83* 4.56* 4.35*  CALCIUM 8.3* 8.8*  < > 9.3  < > 9.0 9.0 9.7  PHOS 4.2 4.1  --  6.5*  --   --   --   --   < > = values in this interval not displayed.  Recent Labs  04/15/16 1145 12/16/16 0609  12/31/16 0550 01/01/17 0601 01/12/17 0715  AST 19 18  --   --   --   --   ALT 11 13*  --   --   --   --   ALKPHOS 82 86  --   --   --   --   BILITOT 1.4* 0.5  --   --   --   --   PROT 6.5 6.6  --   --   --   --   ALBUMIN 3.7 2.5*  < > 2.8* 3.3* 3.3*  < > = values in this interval  not displayed.  Recent Labs  01/06/17 0500 01/08/17 1701 01/12/17 0715 01/23/17 0730  WBC 7.5 8.0 8.4 9.9  NEUTROABS 3.4 4,000  --  6.2  HGB 9.0* 9.9*  9.1* 9.3*  HCT 28.0* 31.0* 28.1* 28.4*  MCV 88.1 87.8 89.2 88.5  PLT 253 322 259 202   Lab Results  Component Value Date   TSH 2.329 12/24/2016   No results found for: HGBA1C Lab Results  Component Value Date   CHOL 136 04/15/2016   HDL 42 (L) 04/15/2016   LDLCALC 57 04/15/2016   TRIG 184 (H) 04/15/2016   CHOLHDL 3.2 04/15/2016    Significant Diagnostic Results in last 30 days:  Dg Chest Port 1 View  Result Date: 12/30/2016 CLINICAL DATA:  PICC line placement. EXAM: PORTABLE CHEST 1 VIEW COMPARISON:  12/26/2016 and prior exams FINDINGS: Cardiomegaly and pulmonary vascular congestion again noted. Left basilar atelectasis/ scarring again noted. A left PICC line has been slightly retracted with tip now overlying the medial left subclavian vein. There is no evidence of pneumothorax. IMPRESSION: Left PICC line slightly retracted now with tip overlying the medial left subclavian vein. Slightly decreased pulmonary vascular congestion and improved bibasilar aeration. Electronically Signed   By: Margarette Canada M.D.   On: 12/30/2016 16:25    Assessment/Plan   infected prosthetic knee joint-this has been followed by both lower as noted above she has had aspirations-and per or focal consult today Will delay at this point the revision pending improved renal function pain apparently is controlled with the [place] as well as Neurontin  #2-history of acute kidney injury-this is slowly improving she is followed by nephrology she will be followed by nephrology [place] at this point-creatinine most recently 4.56 BUN of 63-clinically she appears to be doing well.  #3 history of normocytic anemia this is thought to be due to her renal disease hemoglobin was 9.1 most recent lab this will be followed as well as an outpatient by primary care as well as nephrology.  #4-metabolic encephalopathy this has largely resolved with no recurrence question possibly caused by her renal issues and hospital.  #5-history  of urinary retention she is on Flomax apparently this has improved.  #6-history of anxiety she is on when necessary Xanax and apparently this has helped significantly.  #7 hypertension this appears to be fairly well controlled per chart review she is on atenolol as well as Norvasc--no defer follow-up to primary care provider  #8 history of hyperlipidemia she is on Crestor-LDL was 57 on lab done in October 2017 will warrant follow-up by primary care provider  Again upon discharge she will need follow-up by orthopedics as well as nephrology-primary care provider has been lined up as well-she will need a CBC and BMP we will order this for tomorrow-also will need continued therapy at home.  [id number] note greater than 30 minutes spent on this discharge summary-greater than 50% of time spent coordinating plan of care for numerous diagnoses.

## 2017-01-22 NOTE — Progress Notes (Signed)
The patient is now around 10 weeks status post excision arthroplasty of an infected left total knee replacement. She has anabolic spacer in place. At her last visit we aspirated the knee and found no infection. The white cell blood count was minimal. Her sedimentation rate and CRP of also trended down. At this point we are recommending a revision arthroplasty taking out the anabolic spacer and placing a revision total knee arthroplasty. She understands this fully but wants [place] to hold off on surgery given the fact that she's had significant renal insufficiency with a high creatinine. This is trending down and she like to have this trimmed down further before considering surgery.  On examination of her left knee incision is healed nicely. There is no significant redness at all. The swelling is minimal. She tolerates me putting her knee to some motion in spite of the anabolic spacer being in her knee.  At this point I will at least try a short hinged knee brace and let her place 25-50% weight on her left knee as comfort allows. We'll see her back in 4 weeks to see how she is doing overall and to see if we are getting closer to proceeding with surgery.

## 2017-01-23 ENCOUNTER — Encounter (HOSPITAL_COMMUNITY)
Admission: RE | Admit: 2017-01-23 | Discharge: 2017-01-23 | Disposition: A | Discharge status: Home / Self Care | Payer: PPO | Source: Skilled Nursing Facility | Attending: *Deleted | Admitting: *Deleted

## 2017-01-23 LAB — BASIC METABOLIC PANEL
Anion gap: 11 (ref 5–15)
BUN: 62 mg/dL — AB (ref 6–20)
CALCIUM: 9.7 mg/dL (ref 8.9–10.3)
CO2: 26 mmol/L (ref 22–32)
CREATININE: 4.35 mg/dL — AB (ref 0.44–1.00)
Chloride: 100 mmol/L — ABNORMAL LOW (ref 101–111)
GFR calc Af Amer: 10 mL/min — ABNORMAL LOW (ref >60)
GFR, EST NON AFRICAN AMERICAN: 9 mL/min — AB (ref >60)
GLUCOSE: 140 mg/dL — AB (ref 65–99)
Potassium: 3.6 mmol/L (ref 3.5–5.1)
SODIUM: 137 mmol/L (ref 135–145)

## 2017-01-23 LAB — CBC WITH DIFFERENTIAL/PLATELET
Basophils Absolute: 0.1 10*3/uL (ref 0.0–0.1)
Basophils Relative: 1 %
EOS ABS: 0.4 10*3/uL (ref 0.0–0.7)
EOS PCT: 4 %
HCT: 28.4 % — ABNORMAL LOW (ref 36.0–46.0)
Hemoglobin: 9.3 g/dL — ABNORMAL LOW (ref 12.0–15.0)
LYMPHS ABS: 2.5 10*3/uL (ref 0.7–4.0)
Lymphocytes Relative: 25 %
MCH: 29 pg (ref 26.0–34.0)
MCHC: 32.7 g/dL (ref 30.0–36.0)
MCV: 88.5 fL (ref 78.0–100.0)
MONO ABS: 0.7 10*3/uL (ref 0.1–1.0)
MONOS PCT: 7 %
Neutro Abs: 6.2 10*3/uL (ref 1.7–7.7)
Neutrophils Relative %: 63 %
PLATELETS: 202 10*3/uL (ref 150–400)
RBC: 3.21 MIL/uL — AB (ref 3.87–5.11)
RDW: 14.8 % (ref 11.5–15.5)
WBC: 9.9 10*3/uL (ref 4.0–10.5)

## 2017-01-26 ENCOUNTER — Ambulatory Visit: Payer: PPO | Admitting: Physician Assistant

## 2017-01-26 NOTE — Progress Notes (Deleted)
Laura Chambers is a 76 y.o. female here to [place] and follow up discharge from Nursing home.  I acted as as Education administrator for [employer], PA-C Anselmo Pickler, LPN  History of Present Illness:   No chief complaint on file.   Acute Concerns:   Chronic Issues:   Health Maintenance: Immunizations -- *** Colonoscopy -- *** Mammogram -- *** PAP -- *** Bone Density -- *** PSA -- *** Diet -- *** Caffeine intake -- *** Sleep habits -- *** Exercise -- *** Weight --    Mood -- ***  No flowsheet data found.  No flowsheet data found.   Other providers/specialists:   Past Medical History:  Diagnosis Date  . Anxiety   . Arthritis   . Complication of anesthesia 2012   "irritated trachea" with cough x 1 year from intubation  . Esophageal hernia   . Fibromyalgia   . HTN (hypertension)   . Hx of colonic polyps   . Hydronephrosis of right kidney   . Hypercholesterolemia   . 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 Obesity   . 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 Renal angiomyolipoma      Social History   Social History  . Marital status: Divorced    Spouse name: N/A  . Number of children: 3  . Years of education: College   Occupational History  .  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 Smoking status: Never Smoker  . Smokeless tobacco: Never Used  . Alcohol use No  . Drug use: No  . Sexual activity: No   Other Topics Concern  . Not on file   Social History Narrative   Patient lives at home alone.   Caffeine Use: 1 cup daily    Past Surgical History:  Procedure Laterality Date  . ABDOMINAL HYSTERECTOMY  1969  . BACK SURGERY  2012   rod in back-  lumbar disc removal with bone graft  . blephoroplasty     bilateral  . BREAST BIOPSY  1989  . CHOLECYSTECTOMY  2010  . EXCISIONAL TOTAL KNEE ARTHROPLASTY WITH ANTIBIOTIC SPACERS Left 11/14/2016   Procedure:  EXCISIONAL LEFT TOTAL KNEE ARTHROPLASTY WITH  PLACEMENT OF ANTIBIOTIC SPACERS;  Surgeon: [name], MD;  Location: [facility];  Service: Orthopedics;  Laterality: Left;  . NISSEN FUNDOPLICATION    . PARAESOPHAGEAL HERNIA REPAIR  1989  . TONSILLECTOMY  1946  . TOTAL HIP ARTHROPLASTY Right 08/31/2012   Procedure: TOTAL HIP ARTHROPLASTY ANTERIOR APPROACH;  Surgeon: Mauri Pole, MD;  Location: [facility];  Service: Orthopedics;  Laterality: Right;  . TOTAL KNEE ARTHROPLASTY  2001   left  . TOTAL KNEE ARTHROPLASTY  2010   right    Family History  Problem Relation Age of Onset  . Diabetes Father   . Osteoarthritis Father   . Heart disease Father   . Stroke Father   . Osteoarthritis Mother   . Stroke Mother   . Pneumonia Mother   . Colon cancer Sister 67  . Colon polyps Unknown        neice/nephew  . Diabetes Sister   . Diabetes Brother     Allergies  Allergen Reactions  . Epinephrine Other (See Comments)    Reaction:  Increased pts BP and caused shoulder to jerk   . Levofloxacin Nausea And Vomiting  . Lipitor [Atorvastatin] Other (See  Comments)    Reaction:  Joint pain and dizziness   . Trovan [Alatrofloxacin] Nausea And Vomiting    Pt states that this med caused pancreatitis.     Current Medications:   Current Outpatient Prescriptions:  .  HYDROcodone-acetaminophen (NORCO/VICODIN) 5-325 MG tablet, Take 1 tablet by mouth every 6 (six) hours as needed for moderate pain. DO NOT EXCEED 3GM OF APAP IN 24 HOURS FROM ALL SOURCES, Disp: 120 tablet, Rfl: 0   Review of Systems:   ROS  Vitals:   There were no vitals filed for this visit.   There is no height or weight on file to calculate BMI.  Physical Exam:   Physical Exam  Assessment and Plan:    There are no diagnoses linked to this encounter.  . Reviewed expectations re: course of current medical issues. . Discussed self-management of symptoms. . Outlined signs and symptoms indicating need for more acute  intervention. . Patient verbalized understanding and all questions were answered. . See orders for this visit as documented in the electronic medical record. . Patient received an After-Visit Summary.  CMA or LPN served as scribe during this visit. History, Physical, and Plan performed by medical provider. Documentation and orders reviewed and attested to.  Inda Coke, PA-C

## 2017-01-27 ENCOUNTER — Ambulatory Visit (INDEPENDENT_AMBULATORY_CARE_PROVIDER_SITE_OTHER): Payer: PPO | Admitting: Physician Assistant

## 2017-01-27 ENCOUNTER — Telehealth: Payer: Self-pay | Admitting: *Deleted

## 2017-01-27 ENCOUNTER — Encounter: Payer: Self-pay | Admitting: Physician Assistant

## 2017-01-27 VITALS — BP 140/70 | HR 69 | Temp 98.9°F | Ht 66.0 in | Wt 198.0 lb

## 2017-01-27 DIAGNOSIS — R109 Unspecified abdominal pain: Secondary | ICD-10-CM | POA: Diagnosis not present

## 2017-01-27 DIAGNOSIS — T8459XS Infection and inflammatory reaction due to other internal joint prosthesis, sequela: Secondary | ICD-10-CM

## 2017-01-27 DIAGNOSIS — N179 Acute kidney failure, unspecified: Secondary | ICD-10-CM

## 2017-01-27 DIAGNOSIS — Z96659 Presence of unspecified artificial knee joint: Secondary | ICD-10-CM

## 2017-01-27 MED ORDER — HYDROCODONE-ACETAMINOPHEN 5-325 MG PO TABS: 1.0000 | ORAL_TABLET | Freq: Four times a day (QID) | ORAL | 0 refills | Status: DC | PRN

## 2017-01-27 NOTE — Telephone Encounter (Signed)
Samantha check [employer] Controlled Substance Reporting System  Controlled substance: Hydrocodone 5/325 mg and Alprazolam 0.25 mg  Pharmacy: [place], [place] dispensed from, Pt now using [facility] in [place].  Status: Hydrocodone last filled 01/02/2017  # 60 tablets and Alprazolam last filled 01/12/2017 #14 tablets.  New Findings N/A   Additional Comments: Samantha aware.

## 2017-01-27 NOTE — Patient Instructions (Signed)
It was great to meet you.  We have put in an urgent referral for you to see the kidney doctor.  If unable to get an appointment soon, please return to see Dr. Juleen China next week to review kidney labs and recheck.

## 2017-01-27 NOTE — Progress Notes (Signed)
Laura Chambers is a 76 y.o. female here to Laura Chambers and discuss stomach issues.  I acted as a Education administrator for [employer], PA-C Anselmo Pickler, LPN  History of Present Illness:   Chief Complaint  Patient presents with  . Establish Care    [employer]  . stomach issues   Acute Concerns: AKI -- Laura Chambers is s/p outpatient arthrocentesis in May 2018, which required hospitalization and antibiotic spacer. She required vanc, zosyn and was later switched to cefepime 2/2 worsening creatinine. She was discharged to a SNF, however lab work at the SNF revealed worsening creatinine up to 5.65, so she was readmitted to the hospital. 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 Patient was discharged back to the SNF with close nephrology follow-up. Most recent labs show a GFR of 9, BUN of 62 and Cre of 4.35. She is here today for repeat BUN and Cre. She was told prior to discharge from SNF that she could have "weekly" BMP's to assess labs. Infected prosthetic knee -- last saw Dr. Ninfa Linden on 01/22/17 -- he is recommending a revision arthroplasty to take out the spacer and placing a revision total knee arthroplasty. She is waiting for her renal function to improve prior to surgery. She was put in a hinged knee brace. Stomach issues -- patient reports that she has had some intermittent stomach issues since being at the SNF. She takes colace prn for constipation. Appetite is normal, no blood in stool. She has taken gas-x with great relief. She has relief of her pain with BM's and passage of flatus.  Weight -- Weight: 198 lb (89.8 kg)   Depression screen [facility] 2/9 01/27/2017  Decreased Interest 0  Down, Depressed, Hopeless 0  PHQ - 2 Score 0     No flowsheet data found.   Other providers/specialists: Dr. Lowanda Foster -- nephrologist Dr. Ninfa Linden -- orthopedist Dr. Stanford Breed -- cardiologist Dr. Jaynee Eagles -- neurologist Dr. Olevia Perches -- GI   Past Medical History:  Diagnosis Date  . Anxiety   . Arthritis   . Blood transfusion without reported diagnosis   . Complication of anesthesia 2012   "irritated trachea" with cough x 1 year from intubation  . Esophageal hernia   . Fibromyalgia   . HTN (hypertension)   . Hx of colonic polyps   . Hydronephrosis of right kidney   . Hypercholesterolemia   . 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 Obesity   . 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 Osteoporosis   . Renal angiomyolipoma      Social History   Social History  . Marital status: Divorced    Spouse name: N/A  . Number of children: 3  . Years of education: College   Occupational History  .  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 Smoking status: Never Smoker  . Smokeless tobacco: Never Used  . Alcohol use No  . Drug use: No  . Sexual activity: No   Other Topics Concern  . Not on file   Social History Narrative   Patient lives at home alone.   Caffeine Use: 1 cup daily    Past Surgical History:  Procedure Laterality Date  . ABDOMINAL HYSTERECTOMY  1969  . BACK SURGERY  2012   rod in back-  lumbar disc removal with bone graft  . blephoroplasty     bilateral  . BREAST BIOPSY  1989  . CHOLECYSTECTOMY  2010  . EXCISIONAL TOTAL KNEE ARTHROPLASTY WITH ANTIBIOTIC SPACERS Left 11/14/2016   Procedure: EXCISIONAL LEFT TOTAL KNEE ARTHROPLASTY WITH  PLACEMENT OF ANTIBIOTIC SPACERS;  Surgeon: [name], MD;  Location: [facility];  Service: Orthopedics;  Laterality: Left;  . NISSEN FUNDOPLICATION    . PARAESOPHAGEAL HERNIA REPAIR  1989  . TONSILLECTOMY  1946  . TOTAL HIP ARTHROPLASTY Right 08/31/2012   Procedure: TOTAL HIP  ARTHROPLASTY ANTERIOR APPROACH;  Surgeon: Mauri Pole, MD;  Location: [facility];  Service: Orthopedics;  Laterality: Right;  . TOTAL KNEE ARTHROPLASTY  2001   left  . TOTAL KNEE ARTHROPLASTY  2010   right    Family History  Problem Relation Age of Onset  . Diabetes Father   . Osteoarthritis Father   . Heart disease Father   . Stroke Father   . Osteoarthritis Mother   . Stroke Mother   . Pneumonia Mother   . Colon cancer Sister 65  . Colon polyps Unknown        neice/nephew  . Diabetes Sister   . Diabetes Brother     Allergies  Allergen Reactions  . Ativan [Lorazepam] Other (See Comments)    Highly irritable  . Haldol [Haloperidol] Other (See Comments)    Vegetative State  . Epinephrine Other (See Comments)    Reaction:  Increased pts BP and caused shoulder to jerk   . Levofloxacin Nausea And Vomiting  . Lipitor [Atorvastatin] Other (See Comments)    Reaction:  Joint pain and dizziness   . Trovan [Alatrofloxacin] Nausea And Vomiting    Pt states that this med caused pancreatitis.     Current Medications:   Current Outpatient Prescriptions:  .  ALPRAZolam (XANAX) 0.25 MG tablet, Take 0.25 mg by mouth at bedtime as needed for anxiety., Disp: , Rfl:  .  amLODipine (NORVASC) 10 MG tablet, Take 1 tablet (10 mg total) by mouth daily., Disp: , Rfl:  .  atenolol (TENORMIN) 50 MG tablet, Take 1 tablet (50 mg total) by mouth daily., Disp: , Rfl:  .  Calcium Carbonate-Vitamin D (CALCIUM 600+D) 600-400 MG-UNIT tablet, Take 1 tablet by mouth every other day., Disp: , Rfl:  .  docusate sodium (COLACE) 100 MG capsule, Take 1 capsule (100 mg total) by mouth 2 (two) times daily. (Patient taking differently: Take 100 mg by mouth daily. ), Disp: 10 capsule, Rfl: 0 .  folic acid (FOLVITE) 426 MCG tablet, Take 400 mcg by mouth daily., Disp: , Rfl:  .  furosemide (LASIX) 40 MG tablet, Take 1 tablet (40 mg total) by mouth daily as needed for edema., Disp: 30 tablet, Rfl: 11 .  gabapentin  (NEURONTIN) 100 MG capsule, Take 1 capsule (100 mg total) by mouth 2 (two) times daily., Disp: , Rfl:  .  Multiple Vitamin (MULTIVITAMIN WITH MINERALS) TABS tablet, Take 1 tablet by mouth every other day., Disp: , Rfl:  .  promethazine (PHENERGAN) 25 MG tablet, Take 25 mg by mouth every 8 (eight) hours as needed for nausea or vomiting. , Disp: , Rfl: 0 .  rosuvastatin (CRESTOR) 10 MG tablet, Take 1 tablet (10 mg total) by mouth daily., Disp: 90 tablet, Rfl: 1 .  tamsulosin (FLOMAX) 0.4 MG CAPS capsule, Take  0.4 mg by mouth every evening., Disp: , Rfl:  .  HYDROcodone-acetaminophen (NORCO/VICODIN) 5-325 MG tablet, Take 1 tablet by mouth every 6 (six) hours as needed for moderate pain. DO NOT EXCEED 3GM OF APAP IN 24 HOURS FROM ALL SOURCES, Disp: 20 tablet, Rfl: 0 .  Menthol, Topical Analgesic, (BIOFREEZE) 4 % GEL, Add Biofreeze to BLE for pain management once a day, Disp: , Rfl:    Review of Systems:   Review of Systems  Constitutional: Negative for chills, fever, malaise/fatigue and weight loss.  HENT: Negative for hearing loss, sinus pain and sore throat.   Eyes: Negative for blurred vision.  Respiratory: Negative for cough and shortness of breath.   Cardiovascular: Negative for chest pain, palpitations and leg swelling.  Gastrointestinal: Positive for abdominal pain and nausea. Negative for constipation, diarrhea, heartburn and vomiting.  Genitourinary: Negative for dysuria, frequency and urgency.  Musculoskeletal: Negative for back pain, myalgias and neck pain.       Abdominal pain off and on.  Skin: Negative for itching and rash.  Neurological: Negative for dizziness, tingling, seizures, loss of consciousness and headaches.  Endo/Heme/Allergies: Negative for polydipsia.  Psychiatric/Behavioral: Negative for depression. The patient is not nervous/anxious.     Vitals:   Vitals:   01/27/17 1500  BP: 140/70  Pulse: 69  Temp: 98.9 F (37.2 C)  TempSrc: Oral  SpO2: 96%  Weight: 198  lb (89.8 kg)  Height: 5\' 6"  (1.676 m)     Body mass index is 31.96 kg/m.  Physical Exam:   Physical Exam  Constitutional: She appears well-developed. She is cooperative.  Non-toxic appearance. She does not have a sickly appearance. She does not appear ill. No distress.  Cardiovascular: Normal rate, regular rhythm, S1 normal, S2 normal, normal heart sounds and normal pulses.   No LE edema  Pulmonary/Chest: Effort normal and breath sounds normal.  Abdominal: Normal appearance and bowel sounds are normal. There is generalized tenderness. There is no rigidity, no rebound and no guarding.  Musculoskeletal:  L knee in hinged knee brace; patient sitting in wheelchair in exam room  Neurological: She is alert. GCS eye subscore is 4. GCS verbal subscore is 5. GCS motor subscore is 6.  Skin: Skin is warm, dry and intact.  Psychiatric: She has a normal mood and affect. Her speech is normal and behavior is normal.  Nursing note and vitals reviewed.    Assessment and Plan:    Hanni was seen today for establish care and stomach issues.  Diagnoses and all orders for this visit:  Acute kidney injury ([place]) Will repeat BMP as well as add magnesium and phosphorus with today's labs. She is wanting to transfer her care to [facility]. I am going to put in a referral for this. Reviewed case with Dr. Juleen China. Follow-up in 1 week for repeat labs. -     Basic metabolic panel -     Magnesium -     Phosphorus -     Ambulatory referral to Nephrology  Infected prosthetic knee joint, sequela Overall stable. Management per ortho. [place] Controlled Substance Database reviewed today regarding patient. Patient is compliant with [place] regarding pharmacy use and one-prescribing provider. It is appropriate to continue current medication regimen. I have given her 15 tablets of Norco as she reports that she is almost out. She completed a controlled substance contract today. Advised her to take lowest amount  possible, or to just take Tylenol instead if able. Also recommend that she avoids this  medication with xanax. Patient verbalized understanding.  Stomach discomfort Exam benign. Recommended staying on top of bowel movements and maintaining regularity, especially while immobile and taking pain medications. Drink plenty of water. Follow-up if symptoms worsen.  *Due to complexity of patient, will transfer care to Dr. Briscoe Deutscher*  Other orders -     HYDROcodone-acetaminophen (NORCO/VICODIN) 5-325 MG tablet; Take 1 tablet by mouth every 6 (six) hours as needed for moderate pain. DO NOT EXCEED 3GM OF APAP IN 24 HOURS FROM ALL SOURCES  . Reviewed expectations re: course of current medical issues. . Discussed self-management of symptoms. . Outlined signs and symptoms indicating need for more acute intervention. . Patient verbalized understanding and all questions were answered. . See orders for this visit as documented in the electronic medical record. . Patient received an After-Visit Summary.  CMA or LPN served as scribe during this visit. History, Physical, and Plan performed by medical provider. Documentation and orders reviewed and attested to.  Inda Coke, PA-C

## 2017-01-28 ENCOUNTER — Telehealth: Payer: Self-pay

## 2017-01-28 ENCOUNTER — Encounter: Payer: Self-pay | Admitting: Internal Medicine

## 2017-01-28 DIAGNOSIS — I129 Hypertensive chronic kidney disease with stage 1 through stage 4 chronic kidney disease, or unspecified chronic kidney disease: Secondary | ICD-10-CM | POA: Diagnosis not present

## 2017-01-28 DIAGNOSIS — N189 Chronic kidney disease, unspecified: Secondary | ICD-10-CM | POA: Diagnosis not present

## 2017-01-28 DIAGNOSIS — G629 Polyneuropathy, unspecified: Secondary | ICD-10-CM | POA: Diagnosis not present

## 2017-01-28 DIAGNOSIS — M797 Fibromyalgia: Secondary | ICD-10-CM | POA: Diagnosis not present

## 2017-01-28 DIAGNOSIS — E669 Obesity, unspecified: Secondary | ICD-10-CM | POA: Diagnosis not present

## 2017-01-28 DIAGNOSIS — D649 Anemia, unspecified: Secondary | ICD-10-CM | POA: Diagnosis not present

## 2017-01-28 DIAGNOSIS — T8454XD Infection and inflammatory reaction due to internal left knee prosthesis, subsequent encounter: Secondary | ICD-10-CM | POA: Diagnosis not present

## 2017-01-28 DIAGNOSIS — F419 Anxiety disorder, unspecified: Secondary | ICD-10-CM | POA: Diagnosis not present

## 2017-01-28 DIAGNOSIS — M1991 Primary osteoarthritis, unspecified site: Secondary | ICD-10-CM | POA: Diagnosis not present

## 2017-01-28 DIAGNOSIS — E7800 Pure hypercholesterolemia, unspecified: Secondary | ICD-10-CM | POA: Diagnosis not present

## 2017-01-28 LAB — BASIC METABOLIC PANEL
BUN: 44 mg/dL — AB (ref 6–23)
CALCIUM: 9.4 mg/dL (ref 8.4–10.5)
CO2: 27 mEq/L (ref 19–32)
Chloride: 103 mEq/L (ref 96–112)
Creatinine, Ser: 3.7 mg/dL — ABNORMAL HIGH (ref 0.40–1.20)
GFR: 12.64 mL/min — AB (ref >60.00)
Glucose, Bld: 99 mg/dL (ref 70–99)
POTASSIUM: 4 meq/L (ref 3.5–5.1)
SODIUM: 139 meq/L (ref 135–145)

## 2017-01-28 LAB — PHOSPHORUS: Phosphorus: 4 mg/dL (ref 2.3–4.6)

## 2017-01-28 LAB — MAGNESIUM: MAGNESIUM: 2.1 mg/dL (ref 1.5–2.5)

## 2017-01-28 NOTE — Telephone Encounter (Signed)
Please see message. °

## 2017-01-28 NOTE — Telephone Encounter (Signed)
Noted.  Inda Coke PA-C 01/28/17

## 2017-01-28 NOTE — Telephone Encounter (Signed)
Laura Chambers lab called with critical result on pt. GFR of 12.63.

## 2017-02-02 ENCOUNTER — Telehealth: Payer: Self-pay | Admitting: Physician Assistant

## 2017-02-02 ENCOUNTER — Telehealth: Payer: Self-pay

## 2017-02-02 NOTE — Telephone Encounter (Signed)
Pt coming for "weekly" labs 02/06/17. Please place future orders. Thank you.

## 2017-02-02 NOTE — Telephone Encounter (Signed)
ROI Faxed to Dr Manuela Schwartz [phone] mc

## 2017-02-03 ENCOUNTER — Other Ambulatory Visit: Payer: Self-pay | Admitting: Physician Assistant

## 2017-02-03 DIAGNOSIS — N179 Acute kidney failure, unspecified: Secondary | ICD-10-CM

## 2017-02-03 NOTE — Telephone Encounter (Signed)
Aldona Bar is taking care of this.

## 2017-02-06 ENCOUNTER — Other Ambulatory Visit (INDEPENDENT_AMBULATORY_CARE_PROVIDER_SITE_OTHER): Payer: PPO

## 2017-02-06 ENCOUNTER — Other Ambulatory Visit: Payer: PPO

## 2017-02-06 DIAGNOSIS — N179 Acute kidney failure, unspecified: Secondary | ICD-10-CM

## 2017-02-06 NOTE — Addendum Note (Signed)
Addended by: Frutoso Chase A on: 02/06/2017 03:20 PM   Modules accepted: Orders

## 2017-02-07 LAB — BASIC METABOLIC PANEL
BUN: 35 mg/dL — ABNORMAL HIGH (ref 7–25)
CO2: 24 mmol/L (ref 20–31)
Calcium: 9.5 mg/dL (ref 8.6–10.4)
Chloride: 102 mmol/L (ref 98–110)
Creat: 3.07 mg/dL — ABNORMAL HIGH (ref 0.60–0.93)
Glucose, Bld: 100 mg/dL — ABNORMAL HIGH (ref 65–99)
Potassium: 4.4 mmol/L (ref 3.5–5.3)
SODIUM: 139 mmol/L (ref 135–146)

## 2017-02-10 ENCOUNTER — Telehealth: Payer: Self-pay | Admitting: Cardiology

## 2017-02-10 ENCOUNTER — Telehealth: Payer: Self-pay | Admitting: Physician Assistant

## 2017-02-10 ENCOUNTER — Other Ambulatory Visit: Payer: Self-pay | Admitting: Cardiology

## 2017-02-10 MED ORDER — ATENOLOL 50 MG PO TABS: 50.0000 mg | ORAL_TABLET | Freq: Every day | ORAL | 0 refills | Status: DC

## 2017-02-10 NOTE — Telephone Encounter (Signed)
Patient directly notified. Informed her that she needs to follow up with Dr Stanford Breed, she stated she has had knee surgery and has been in a wheel chair. I let her know that I would have Dr Jacalyn Lefevre scheduler contact her.

## 2017-02-10 NOTE — Telephone Encounter (Signed)
Patient called in reference to lab results. Please call patient and advise. OK to leave message.  °

## 2017-02-10 NOTE — Telephone Encounter (Signed)
Spoke to pt, told her Kidney labs continue to improve per [facility] and to follow up with Dr. Juleen China this week as scheduled. Pt verbalized understanding and wanted to know what creatinine was? Told her 3.07. Pt verbalized understanding.

## 2017-02-10 NOTE — Telephone Encounter (Signed)
New message     *STAT* If patient is at the pharmacy, call can be transferred to refill team.   1. Which medications need to be refilled? (please list name of each medication and dose if known) atenolol (TENORMIN) 50 MG tablet  2. Which pharmacy/location (including street and city if local pharmacy) [facility] [place]   3. Do they need a 30 day or 90 day supply? 90 days supply

## 2017-02-11 ENCOUNTER — Telehealth: Payer: Self-pay | Admitting: Cardiology

## 2017-02-11 NOTE — Telephone Encounter (Signed)
New Message     [facility] [address] [facility] [place] [facility] not [facility] the prescription and she is completely out of medication for  atenolol (TENORMIN) 50 MG tablet Take 1 tablet (50 mg total) by mouth daily.   Could you check with the pharmacy, please.

## 2017-02-12 ENCOUNTER — Telehealth: Payer: Self-pay | Admitting: Cardiology

## 2017-02-12 ENCOUNTER — Telehealth: Payer: Self-pay | Admitting: Physician Assistant

## 2017-02-12 NOTE — Telephone Encounter (Signed)
Rec'd from Dr. Manuela Schwartz forwarded 2 pages to [facility]

## 2017-02-12 NOTE — Telephone Encounter (Signed)
Spoke to pharmacy. They did receive the Rx but said it is not time to refill it yet, she had an Rx filled in July.   Spoke to pt. She stated her son had picked up some prescriptions from the pharmacy and he put them in her drawer at the nursing home. She did not know one of them was atenolol, stated she does not need another refill at this time.

## 2017-02-13 ENCOUNTER — Ambulatory Visit (INDEPENDENT_AMBULATORY_CARE_PROVIDER_SITE_OTHER): Payer: PPO | Admitting: Family Medicine

## 2017-02-13 ENCOUNTER — Encounter: Payer: Self-pay | Admitting: Family Medicine

## 2017-02-13 VITALS — BP 126/76 | HR 93 | Temp 98.5°F

## 2017-02-13 DIAGNOSIS — N179 Acute kidney failure, unspecified: Secondary | ICD-10-CM

## 2017-02-13 DIAGNOSIS — M25471 Effusion, right ankle: Secondary | ICD-10-CM

## 2017-02-13 DIAGNOSIS — M25472 Effusion, left ankle: Secondary | ICD-10-CM | POA: Diagnosis not present

## 2017-02-13 DIAGNOSIS — I10 Essential (primary) hypertension: Secondary | ICD-10-CM | POA: Diagnosis not present

## 2017-02-13 DIAGNOSIS — Z96659 Presence of unspecified artificial knee joint: Secondary | ICD-10-CM

## 2017-02-13 DIAGNOSIS — T8459XS Infection and inflammatory reaction due to other internal joint prosthesis, sequela: Secondary | ICD-10-CM | POA: Diagnosis not present

## 2017-02-13 DIAGNOSIS — R7989 Other specified abnormal findings of blood chemistry: Secondary | ICD-10-CM | POA: Diagnosis not present

## 2017-02-13 NOTE — Progress Notes (Signed)
Laura Chambers is a 76 y.o. female is here for follow up.  History of Present Illness:   Shaune Pascal CMA acting as scribe for Dr. Juleen China.  HPI: Patient comes in today to establish with Dr. Juleen China. She is having some pain in the left leg at night. She has hydrocodone at home to take but has not taken it since she fears that it will hurt her renal function.   Left Knee:  Hx of infected total knee replacement, s/p excision arthroplasty with antibiotic spacer but in place. Plan is for revision arthroplasty when creatinine is improved.   AKI: Interstitial nephritis from antibiotics versus ATN versus prerenal azotemia. No biopsy as patient refused. Followed by Nephrology. Weekly BMP here now.  LE Edema: Moderate. Takes Lasix prn. Patient states that she is trying to use sparingly so that she does not hurt her renal function. Usually needs once weekly, but used twice last week.   Lab Results  Component Value Date   CREATININE 3.28 (H) 02/13/2017   CREATININE 3.07 (H) 02/06/2017   CREATININE 3.70 (H) 01/27/2017   Health Maintenance Due  Topic Date Due  . TETANUS/TDAP  05/19/1960  . DEXA SCAN  05/18/2005  . INFLUENZA VACCINE  02/04/2017   Depression screen PHQ 2/9 01/27/2017  Decreased Interest 0  Down, Depressed, Hopeless 0  PHQ - 2 Score 0   PMHx, SurgHx, SocialHx, FamHx, Medications, and Allergies were reviewed in the Visit Navigator and updated as appropriate.   Patient Active Problem List   Diagnosis Date Noted  . Obesity, Class III, BMI 40-49.9 (morbid obesity) ([place]) 12/28/2016  . Normocytic anemia 12/16/2016  . AKI (acute kidney injury) ([place]) 12/15/2016  . Essential hypertension 12/08/2016  . 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 Knee pain, left 11/11/2016  . Pain in left leg 11/10/2016  . Musculoskeletal neck pain 08/19/2015  . Peripheral neuropathy 01/12/2014  . Hyperglycemia 06/16/2012  . Benign hypertensive heart disease without heart failure  06/16/2012  . Dyslipidemia 06/16/2012  . Globus sensation 02/04/2012  . Nausea 02/04/2012  . Bowel habit changes 02/04/2012   Social History  Substance Use Topics  . Smoking status: Never Smoker  . Smokeless tobacco: Never Used  . Alcohol use No   Current Medications and Allergies:   .  ALPRAZolam (XANAX) 0.25 MG tablet, Take 0.25 mg by mouth at bedtime as needed for anxiety., Disp: , Rfl:  .  amLODipine (NORVASC) 10 MG tablet, Take 1 tablet (10 mg total) by mouth daily., Disp: , Rfl:  .  atenolol (TENORMIN) 50 MG tablet, Take 1 tablet (50 mg total) by mouth daily., Disp: 90 tablet, Rfl: 0 .  Calcium Carbonate-Vitamin D (CALCIUM 600+D) 600-400 MG-UNIT tablet, Take 1 tablet by mouth every other day., Disp: , Rfl:  .  docusate sodium (COLACE) 100 MG capsule, Take 1 capsule (100 mg total) by mouth 2 (two) times daily. (Patient taking differently: Take 100 mg by mouth daily. ), Disp: 10 capsule, Rfl: 0 .  folic acid (FOLVITE) 308 MCG tablet, Take 400 mcg by mouth daily., Disp: , Rfl:  .  furosemide (LASIX) 40 MG tablet, Take 1 tablet (40 mg total) by mouth daily as needed for edema., Disp: 30 tablet, Rfl: 11 .  gabapentin (NEURONTIN) 100 MG capsule, Take 1 capsule (100 mg total) by mouth 2 (two) times daily., Disp: , Rfl:  .  Menthol, Topical Analgesic, (BIOFREEZE) 4 % GEL, Add Biofreeze to BLE for pain management once a day, Disp: ,  Rfl:  .  Multiple Vitamin (MULTIVITAMIN WITH MINERALS) TABS tablet, Take 1 tablet by mouth every other day., Disp: , Rfl:  .  promethazine (PHENERGAN) 25 MG tablet, Take 25 mg by mouth every 8 (eight) hours as needed for nausea or vomiting. , Disp: , Rfl: 0 .  rosuvastatin (CRESTOR) 10 MG tablet, Take 1 tablet (10 mg total) by mouth daily., Disp: 90 tablet, Rfl: 1 .  tamsulosin (FLOMAX) 0.4 MG CAPS capsule, Take 0.4 mg by mouth every evening., Disp: , Rfl:  .  HYDROcodone-acetaminophen (NORCO/VICODIN) 5-325 MG tablet, Take 1 tablet by mouth every 6 (six) hours as  needed for moderate pain. DO NOT EXCEED 3GM OF APAP IN 24 HOURS FROM ALL SOURCES (Patient not taking: Reported on 02/13/2017), Disp: 20 tablet, Rfl: 0  Allergies  Allergen Reactions  . Ativan [Lorazepam] Other (See Comments)    Highly irritable  . Haldol [Haloperidol] Other (See Comments)    Vegetative State  . Epinephrine Other (See Comments)    Reaction:  Increased pts BP and caused shoulder to jerk   . Levofloxacin Nausea And Vomiting  . Lipitor [Atorvastatin] Other (See Comments)    Reaction:  Joint pain and dizziness   . Trovan [Alatrofloxacin] Nausea And Vomiting    Pt states that this med caused pancreatitis.   Review of Systems   Pertinent items are noted in the HPI. Otherwise, ROS is negative.  Vitals:   Vitals:   02/13/17 1455  BP: 126/76  Pulse: 93  Temp: 98.5 F (36.9 C)  TempSrc: Oral  SpO2: 94%     There is no height or weight on file to calculate BMI.  Physical Exam:   Physical Exam  Constitutional: She is oriented to person, place, and time. She appears well-developed and well-nourished. No distress.  HENT:  Head: Normocephalic and atraumatic.  Eyes: Pupils are equal, round, and reactive to light. Conjunctivae and EOM are normal.  Neck: Normal range of motion. Neck supple.  Cardiovascular: Normal rate, regular rhythm, normal heart sounds and intact distal pulses.   1+ edema bilateral LE.  Pulmonary/Chest: Effort normal.  Abdominal: Soft. Bowel sounds are normal.  Musculoskeletal: She exhibits no edema.  Hinged left knee brace. Sitting in wheelchair.  Neurological: She is alert and oriented to person, place, and time.  Skin: Skin is warm.  Psychiatric: She has a normal mood and affect. Her behavior is normal.  Nursing note and vitals reviewed.   Results for orders placed or performed in visit on 02/13/17  Comprehensive metabolic panel  Result Value Ref Range   Sodium 140 135 - 146 mmol/L   Potassium 4.8 3.5 - 5.3 mmol/L   Chloride 103 98 - 110  mmol/L   CO2 23 20 - 32 mmol/L   Glucose, Bld 103 (H) 65 - 99 mg/dL   BUN 39 (H) 7 - 25 mg/dL   Creat 3.28 (H) 0.60 - 0.93 mg/dL   Total Bilirubin 0.5 0.2 - 1.2 mg/dL   Alkaline Phosphatase 110 33 - 130 U/L   AST 21 10 - 35 U/L   ALT 16 6 - 29 U/L   Total Protein 7.6 6.1 - 8.1 g/dL   Albumin 4.0 3.6 - 5.1 g/dL   Calcium 9.9 8.6 - 10.4 mg/dL   Assessment and Plan:   Laura Chambers was seen today for follow-up.  Diagnoses and all orders for this visit:  AKI (acute kidney injury) ([place]) Comments: Stable but no improvement. Will inform patient and Nephrology. Note: add phos to  next lab draw. Will continue to avoid nephrotoxic meds.  Orders: -     Comprehensive metabolic panel  Obesity, Class III, BMI 40-49.9 (morbid obesity) ([facility]) Comments: Patient monitoring diet intake while she is wheelchair-bound.   Infected prosthetic knee joint, sequela Comments: Vitals stable. No new symptoms.   Essential hypertension Comments: Stable. Continue current treatment.   Ankle edema, bilateral Comments: Reviewed elevation of legs and compression hose.    . Reviewed expectations re: course of current medical issues. . Discussed self-management of symptoms. . Outlined signs and symptoms indicating need for more acute intervention. . Patient verbalized understanding and all questions were answered. Marland Kitchen Health Maintenance issues including appropriate healthy diet, exercise, and smoking avoidance were discussed with patient. . See orders for this visit as documented in the electronic medical record. . Patient received an After Visit Summary.  CMA served as Education administrator during this visit. History, Physical, and Plan performed by medical provider. The above documentation has been reviewed and is accurate and complete. Briscoe Deutscher, D.O.  Briscoe Deutscher, DO [place], [place] 02/15/2017  Future Appointments Date Time Provider [place]  02/20/2017 3:00 PM [facility] LAB [facility] None    02/26/2017 3:00 PM Pete Pelt, PA-C [facility] None

## 2017-02-14 LAB — COMPREHENSIVE METABOLIC PANEL WITH GFR
ALT: 16 U/L (ref 6–29)
AST: 21 U/L (ref 10–35)
Albumin: 4 g/dL (ref 3.6–5.1)
Alkaline Phosphatase: 110 U/L (ref 33–130)
BUN: 39 mg/dL — ABNORMAL HIGH (ref 7–25)
CO2: 23 mmol/L (ref 20–32)
Calcium: 9.9 mg/dL (ref 8.6–10.4)
Chloride: 103 mmol/L (ref 98–110)
Creat: 3.28 mg/dL — ABNORMAL HIGH (ref 0.60–0.93)
Glucose, Bld: 103 mg/dL — ABNORMAL HIGH (ref 65–99)
Potassium: 4.8 mmol/L (ref 3.5–5.3)
Sodium: 140 mmol/L (ref 135–146)
Total Bilirubin: 0.5 mg/dL (ref 0.2–1.2)
Total Protein: 7.6 g/dL (ref 6.1–8.1)

## 2017-02-15 ENCOUNTER — Telehealth: Payer: Self-pay | Admitting: Family Medicine

## 2017-02-15 NOTE — Telephone Encounter (Signed)
Please send lab results for last month to Dr. Lowanda Foster: [phone].

## 2017-02-16 NOTE — Telephone Encounter (Signed)
Labs faxed

## 2017-02-17 NOTE — Telephone Encounter (Signed)
Pt will scheduled AWV when she comes in for blood work on 02/27/17

## 2017-02-20 ENCOUNTER — Other Ambulatory Visit: Payer: Self-pay

## 2017-02-20 ENCOUNTER — Other Ambulatory Visit (INDEPENDENT_AMBULATORY_CARE_PROVIDER_SITE_OTHER): Payer: PPO

## 2017-02-20 DIAGNOSIS — N179 Acute kidney failure, unspecified: Secondary | ICD-10-CM

## 2017-02-21 LAB — BASIC METABOLIC PANEL
BUN: 37 mg/dL — ABNORMAL HIGH (ref 7–25)
CO2: 23 mmol/L (ref 20–32)
Calcium: 9.5 mg/dL (ref 8.6–10.4)
Chloride: 103 mmol/L (ref 98–110)
Creat: 2.9 mg/dL — ABNORMAL HIGH (ref 0.60–0.93)
Glucose, Bld: 134 mg/dL — ABNORMAL HIGH (ref 65–99)
Potassium: 4.8 mmol/L (ref 3.5–5.3)
Sodium: 139 mmol/L (ref 135–146)

## 2017-02-21 LAB — PHOSPHORUS: Phosphorus: 4.7 mg/dL — ABNORMAL HIGH (ref 2.1–4.3)

## 2017-02-23 ENCOUNTER — Other Ambulatory Visit: Payer: Self-pay

## 2017-02-23 ENCOUNTER — Telehealth: Payer: Self-pay | Admitting: Family Medicine

## 2017-02-23 DIAGNOSIS — N179 Acute kidney failure, unspecified: Secondary | ICD-10-CM

## 2017-02-23 MED ORDER — AMLODIPINE BESYLATE 10 MG PO TABS: 10.0000 mg | ORAL_TABLET | Freq: Every day | ORAL | 2 refills | Status: DC

## 2017-02-23 NOTE — Telephone Encounter (Signed)
Okay refill. 

## 2017-02-23 NOTE — Telephone Encounter (Signed)
Patient calling to get lab results. Asking for return call when available.

## 2017-02-23 NOTE — Telephone Encounter (Signed)
MEDICATION:   amLODipine (NORVASC) 10 MG tablet     PHARMACY:  [id number] [place] [place] [address] [phone] (Phone) [phone] (Fax)     IS THIS A 90 DAY SUPPLY : yes  IS PATIENT OUT OF MEDICTAION: yes  IF NOT; HOW MUCH IS LEFT: n/a  LAST APPOINTMENT DATE:02/13/17  NEXT APPOINTMENT DATE:n/a  OTHER COMMENTS:    **Let patient know to contact pharmacy at the end of the day to make sure medication is ready. **  ** Please notify patient to allow 48-72 hours to process**  **Encourage patient to contact the pharmacy for refills or they can request refills through [facility]**

## 2017-02-23 NOTE — Telephone Encounter (Signed)
Please advise on refill.

## 2017-02-23 NOTE — Telephone Encounter (Signed)
Patient notified of results.  States she has an appointment with her orthopedist on Thursday this weeks.  She thinks they might draw labs then.  If they do, she will ask them to draw her labs then.  Orders have been placed.  If they do not draw them Thursday, she will come here and have them drawn.

## 2017-02-23 NOTE — Telephone Encounter (Signed)
RX sent to pharmacy  

## 2017-02-24 ENCOUNTER — Other Ambulatory Visit: Payer: Self-pay

## 2017-02-24 MED ORDER — TAMSULOSIN HCL 0.4 MG PO CAPS
0.4000 mg | ORAL_CAPSULE | Freq: Every evening | ORAL | 0 refills | Status: DC
Start: 2017-02-24 — End: 2017-05-18

## 2017-02-24 MED ORDER — GABAPENTIN 100 MG PO CAPS: 100.0000 mg | ORAL_CAPSULE | Freq: Two times a day (BID) | ORAL | 0 refills | Status: DC

## 2017-02-24 MED ORDER — FUROSEMIDE 40 MG PO TABS: 40.0000 mg | ORAL_TABLET | Freq: Every day | ORAL | 0 refills | Status: DC | PRN

## 2017-02-24 NOTE — Telephone Encounter (Signed)
MEDICATION:  tamsulosin (FLOMAX) 0.4 MG CAPS capsule  furosemide (LASIX) 40 MG tablet  gabapentin (NEURONTIN) 100 MG capsule   PHARMACY:  [id number] [place], [place] [address] (Phone) [phone] (Fax)   IS THIS A 90 DAY SUPPLY : yes for each  IS PATIENT OUT OF MEDICTAION: yes  IF NOT; HOW MUCH IS LEFT: n/a  LAST APPOINTMENT DATE:02/13/17  NEXT APPOINTMENT DATE:n/a  OTHER COMMENTS: Patient was organizing her pill box this morning and realized she forgot to add them in the request yesterday   **Let patient know to contact pharmacy at the end of the day to make sure medication is ready. **  ** Please notify patient to allow 48-72 hours to process**  **Encourage patient to contact the pharmacy for refills or they can request refills through [facility]**

## 2017-02-24 NOTE — Telephone Encounter (Signed)
Refills sent to patient's pharmacy.

## 2017-02-26 ENCOUNTER — Ambulatory Visit (INDEPENDENT_AMBULATORY_CARE_PROVIDER_SITE_OTHER): Payer: PPO | Admitting: Physician Assistant

## 2017-02-26 ENCOUNTER — Other Ambulatory Visit (INDEPENDENT_AMBULATORY_CARE_PROVIDER_SITE_OTHER): Payer: Self-pay

## 2017-02-26 ENCOUNTER — Encounter (INDEPENDENT_AMBULATORY_CARE_PROVIDER_SITE_OTHER): Payer: Self-pay | Admitting: Physician Assistant

## 2017-02-26 DIAGNOSIS — Z96659 Presence of unspecified artificial knee joint: Secondary | ICD-10-CM | POA: Diagnosis not present

## 2017-02-26 DIAGNOSIS — N183 Chronic kidney disease, stage 3 unspecified: Secondary | ICD-10-CM

## 2017-02-26 DIAGNOSIS — D649 Anemia, unspecified: Secondary | ICD-10-CM

## 2017-02-26 DIAGNOSIS — T8459XS Infection and inflammatory reaction due to other internal joint prosthesis, sequela: Secondary | ICD-10-CM

## 2017-02-26 LAB — CBC
HCT: 29.8 % — ABNORMAL LOW (ref 35.0–45.0)
Hemoglobin: 9.6 g/dL — ABNORMAL LOW (ref 11.7–15.5)
MCH: 29 pg (ref 27.0–33.0)
MCHC: 32.2 g/dL (ref 32.0–36.0)
MCV: 90 fL (ref 80.0–100.0)
MPV: 11.1 fL (ref 7.5–12.5)
PLATELETS: 253 10*3/uL (ref 140–400)
RBC: 3.31 MIL/uL — AB (ref 3.80–5.10)
RDW: 15.4 % — ABNORMAL HIGH (ref 11.0–15.0)
WBC: 10.3 10*3/uL (ref 3.8–10.8)

## 2017-02-26 NOTE — Progress Notes (Signed)
Office Visit Note   Patient: Laura Chambers           Date of Birth: 22-Sep-1940           MRN: [id number] Visit Date: 02/26/2017              Requested by: Lelon Perla, [address], [place] [address] PCP: Briscoe Deutscher, DO   Assessment & Plan: Visit Diagnoses:  1. Infected prosthetic knee joint, sequela     Plan: Labs were drawn today CBC and BMET. See if her renal function continues to improve and also to check her anemia. She'll discuss with her primary care physician or anemia which after going over the records that it seems as if it's been ongoing for some time. She'll speak to her primary care physician about the need for possible workup of this. Dr. Ninfa Linden I had a long discussion with her and the fact that it would like her to be medically optimized from the standpoint of her kidneys and anemia prior to excision of the antibiotic spacer and revision of left total knee. She is in agreement with this. She is tolerating the antibiotic spacer well. See her back in a month to check the status of her acute kidney injury which is followed by nephrology. We'll proceed with the knee revision when they feel that she is medically optimized.  Follow-Up Instructions: Return in about 4 weeks (around 03/26/2017).   Orders:  Orders Placed This Encounter  Procedures  . CBC  . Basic Metabolic Panel (BMET)   No orders of the defined types were placed in this encounter.     Procedures: No procedures performed   Clinical Data: No additional findings.   Subjective: Chief Complaint  Patient presents with  . Left Knee - Pain, Follow-up    HPI Laura Chambers is status post excision of left total knee with placement of antibiotic spacer 11/14/2016. Stone due to infected left total knee. At this point in time we are waiting for her renal function to improve prior to performing a revision arthroplasty of the knee. She overall is doing well and guards to the  knee having no significant pain she does have some pain with prolonged standing. She is unable to perform all the activities she would like. But she is able to bear weight on the knee and ambulate some.  Review of Systems No fevers chills shortness breath chest pain.  Objective: Vital Signs: There were no vitals taken for this visit.  Physical Exam  Constitutional: She is oriented to person, place, and time. She appears well-developed and well-nourished. No distress.  Pulmonary/Chest: Effort normal.  Neurological: She is alert and oriented to person, place, and time.  Skin: She is not diaphoretic.  Psychiatric: She has a normal mood and affect. Her behavior is normal.    Ortho Exam Left knee she has full extension flexion to around 90. No abnormal warmth erythema. Surgical incisions well-healed. No instability with valgus varus stressing. Specialty Comments:  No specialty comments available.  Imaging: No results found.   PMFS History: Patient Active Problem List   Diagnosis Date Noted  . Obesity, Class III, BMI 40-49.9 (morbid obesity) ([place]) 12/28/2016  . Normocytic anemia 12/16/2016  . AKI (acute kidney injury) ([place]) 12/15/2016  . Essential hypertension 12/08/2016  . 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 Knee pain, left 11/11/2016  . Pain in left leg 11/10/2016  . Musculoskeletal neck pain 08/19/2015  . Peripheral  neuropathy 01/12/2014  . Hyperglycemia 06/16/2012  . 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 Dyslipidemia 06/16/2012  . Globus sensation 02/04/2012  . Nausea 02/04/2012  . Bowel habit changes 02/04/2012   Past Medical History:  Diagnosis Date  . Anxiety   . Arthritis   . Blood transfusion without reported diagnosis   . Complication of anesthesia 2012   "irritated trachea" with cough x 1 year from intubation  . Esophageal hernia   . Fibromyalgia   . HTN (hypertension)   . Hx of colonic polyps   .  Hydronephrosis of right kidney   . Hypercholesterolemia   . Neuropathy due to medical condition ([place])    bilateral lower legs  . Obesity   . 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 Osteoporosis   . Renal angiomyolipoma     Family History  Problem Relation Age of Onset  . Diabetes Father   . Osteoarthritis Father   . Heart disease Father   . Stroke Father   . Osteoarthritis Mother   . Stroke Mother   . Pneumonia Mother   . Colon cancer Sister 24  . Colon polyps Unknown        neice/nephew  . Diabetes Sister   . Diabetes Brother     Past Surgical History:  Procedure Laterality Date  . ABDOMINAL HYSTERECTOMY  1969  . BACK SURGERY  2012   rod in back-  lumbar disc removal with bone graft  . blephoroplasty     bilateral  . BREAST BIOPSY  1989  . CHOLECYSTECTOMY  2010  . EXCISIONAL TOTAL KNEE ARTHROPLASTY WITH ANTIBIOTIC SPACERS Left 11/14/2016   Procedure: EXCISIONAL LEFT TOTAL KNEE ARTHROPLASTY WITH  PLACEMENT OF ANTIBIOTIC SPACERS;  Surgeon: [name], MD;  Location: [facility];  Service: Orthopedics;  Laterality: Left;  . NISSEN FUNDOPLICATION    . PARAESOPHAGEAL HERNIA REPAIR  1989  . TONSILLECTOMY  1946  . TOTAL HIP ARTHROPLASTY Right 08/31/2012   Procedure: TOTAL HIP ARTHROPLASTY ANTERIOR APPROACH;  Surgeon: Mauri Pole, MD;  Location: [facility];  Service: Orthopedics;  Laterality: Right;  . TOTAL KNEE ARTHROPLASTY  2001   left  . TOTAL KNEE ARTHROPLASTY  2010   right   Social History   Occupational History  .  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 Smoking status: Never Smoker  . Smokeless tobacco: Never Used  . Alcohol use No  . Drug use: No  . Sexual activity: No

## 2017-02-27 LAB — BASIC METABOLIC PANEL
BUN: 44 mg/dL — AB (ref 7–25)
CO2: 21 mmol/L (ref 20–32)
CREATININE: 2.66 mg/dL — AB (ref 0.60–0.93)
Calcium: 10.1 mg/dL (ref 8.6–10.4)
Chloride: 105 mmol/L (ref 98–110)
Glucose, Bld: 88 mg/dL (ref 65–99)
Potassium: 4.6 mmol/L (ref 3.5–5.3)
SODIUM: 142 mmol/L (ref 135–146)

## 2017-03-02 ENCOUNTER — Telehealth: Payer: Self-pay | Admitting: Family Medicine

## 2017-03-02 DIAGNOSIS — N179 Acute kidney failure, unspecified: Secondary | ICD-10-CM

## 2017-03-02 NOTE — Telephone Encounter (Signed)
Would like a call back RE lab results.  Would like additional labs.  Ty,  -LL

## 2017-03-03 ENCOUNTER — Telehealth (INDEPENDENT_AMBULATORY_CARE_PROVIDER_SITE_OTHER): Payer: Self-pay | Admitting: Orthopaedic Surgery

## 2017-03-03 NOTE — Telephone Encounter (Signed)
Pt would like lab results and wants to pick a copy up. Please give pt a call.

## 2017-03-03 NOTE — Telephone Encounter (Signed)
Are you wanting to order these labs? Patient has an appointment on Friday.

## 2017-03-03 NOTE — Telephone Encounter (Signed)
Patient was instructed to call Erskine Emery, PA-C since labs on 08/23 were ordered by him. Patient also states that she is coming to our office on Friday at 3 for CBC, BUN and creatinine and that Dr. Juleen China is aware. I do not see orders in chart.

## 2017-03-03 NOTE — Telephone Encounter (Signed)
Yes, if not ordered by [facility] this week.

## 2017-03-03 NOTE — Telephone Encounter (Signed)
Will place order on Friday morning.

## 2017-03-03 NOTE — Telephone Encounter (Signed)
Please advise 

## 2017-03-04 NOTE — Telephone Encounter (Signed)
I have placed the orders for these labs.

## 2017-03-04 NOTE — Telephone Encounter (Signed)
Patient aware of her lab results per Artis Delay

## 2017-03-04 NOTE — Telephone Encounter (Signed)
Laura Chambers we have spoken about her lab results please call her with the. There is any further questions please let me know

## 2017-03-06 ENCOUNTER — Other Ambulatory Visit (INDEPENDENT_AMBULATORY_CARE_PROVIDER_SITE_OTHER): Payer: PPO

## 2017-03-06 DIAGNOSIS — N179 Acute kidney failure, unspecified: Secondary | ICD-10-CM | POA: Diagnosis not present

## 2017-03-06 LAB — CBC WITH DIFFERENTIAL/PLATELET
Basophils Absolute: 120 cells/uL (ref 0–200)
Basophils Relative: 1 %
Eosinophils Absolute: 720 cells/uL — ABNORMAL HIGH (ref 15–500)
Eosinophils Relative: 6 %
HCT: 29.8 % — ABNORMAL LOW (ref 35.0–45.0)
Hemoglobin: 9.4 g/dL — ABNORMAL LOW (ref 11.7–15.5)
Lymphocytes Relative: 20 %
Lymphs Abs: 2400 cells/uL (ref 850–3900)
MCH: 28.6 pg (ref 27.0–33.0)
MCHC: 31.5 g/dL — ABNORMAL LOW (ref 32.0–36.0)
MCV: 90.6 fL (ref 80.0–100.0)
MPV: 11.3 fL (ref 7.5–12.5)
Monocytes Absolute: 840 cells/uL (ref 200–950)
Monocytes Relative: 7 %
Neutro Abs: 7920 cells/uL — ABNORMAL HIGH (ref 1500–7800)
Neutrophils Relative %: 66 %
Platelets: 232 10*3/uL (ref 140–400)
RBC: 3.29 MIL/uL — ABNORMAL LOW (ref 3.80–5.10)
RDW: 15 % (ref 11.0–15.0)
WBC: 12 10*3/uL — ABNORMAL HIGH (ref 3.8–10.8)

## 2017-03-06 NOTE — Addendum Note (Signed)
Addended by: Kayren Eaves T on: 03/06/2017 02:55 PM   Modules accepted: Orders

## 2017-03-06 NOTE — Addendum Note (Signed)
Addended by: Kayren Eaves T on: 03/06/2017 02:54 PM   Modules accepted: Orders

## 2017-03-06 NOTE — Addendum Note (Signed)
Addended by: Frutoso Chase A on: 03/06/2017 09:37 AM   Modules accepted: Orders

## 2017-03-07 LAB — BASIC METABOLIC PANEL
BUN: 43 mg/dL — ABNORMAL HIGH (ref 7–25)
CO2: 22 mmol/L (ref 20–32)
Calcium: 9.4 mg/dL (ref 8.6–10.4)
Chloride: 105 mmol/L (ref 98–110)
Creat: 2.78 mg/dL — ABNORMAL HIGH (ref 0.60–0.93)
Glucose, Bld: 106 mg/dL — ABNORMAL HIGH (ref 65–99)
Potassium: 4.6 mmol/L (ref 3.5–5.3)
Sodium: 140 mmol/L (ref 135–146)

## 2017-03-07 LAB — PHOSPHORUS: Phosphorus: 3.5 mg/dL (ref 2.1–4.3)

## 2017-03-10 ENCOUNTER — Telehealth: Payer: Self-pay | Admitting: Family Medicine

## 2017-03-10 ENCOUNTER — Telehealth: Payer: Self-pay

## 2017-03-10 DIAGNOSIS — Z96659 Presence of unspecified artificial knee joint: Principal | ICD-10-CM

## 2017-03-10 DIAGNOSIS — N189 Chronic kidney disease, unspecified: Secondary | ICD-10-CM

## 2017-03-10 DIAGNOSIS — T8459XS Infection and inflammatory reaction due to other internal joint prosthesis, sequela: Secondary | ICD-10-CM

## 2017-03-10 NOTE — Telephone Encounter (Signed)
Patient requesting a call to go over lab results if they are available. Call patient once Dr. Juleen China has reviewed.

## 2017-03-10 NOTE — Telephone Encounter (Signed)
Results are not back from labs on 8/23 or 8/31

## 2017-03-10 NOTE — Telephone Encounter (Signed)
Please confirm which labs need to be ordered

## 2017-03-10 NOTE — Telephone Encounter (Signed)
Pt coming for labs 03/13/17. Please place future orders. Thank you.

## 2017-03-11 NOTE — Telephone Encounter (Signed)
Patient called in reference to lab results. Informed patient of note below. Patient stated she has another doctor's appointment this afternoon and would like the results prior to her appointment if possible. Please call patient and advise. OK to leave message.

## 2017-03-11 NOTE — Telephone Encounter (Signed)
Noted. I have placed standing orders.

## 2017-03-11 NOTE — Telephone Encounter (Signed)
CBC, BMP, phos. Okay to make standing weekly order x 4 weeks. Dx CKD, infected knee.

## 2017-03-13 ENCOUNTER — Telehealth: Payer: Self-pay | Admitting: *Deleted

## 2017-03-13 ENCOUNTER — Ambulatory Visit (INDEPENDENT_AMBULATORY_CARE_PROVIDER_SITE_OTHER): Payer: PPO

## 2017-03-13 ENCOUNTER — Other Ambulatory Visit (INDEPENDENT_AMBULATORY_CARE_PROVIDER_SITE_OTHER): Payer: PPO

## 2017-03-13 DIAGNOSIS — N189 Chronic kidney disease, unspecified: Secondary | ICD-10-CM

## 2017-03-13 DIAGNOSIS — Z23 Encounter for immunization: Secondary | ICD-10-CM

## 2017-03-13 DIAGNOSIS — T8459XS Infection and inflammatory reaction due to other internal joint prosthesis, sequela: Secondary | ICD-10-CM | POA: Diagnosis not present

## 2017-03-13 DIAGNOSIS — Z96659 Presence of unspecified artificial knee joint: Secondary | ICD-10-CM | POA: Diagnosis not present

## 2017-03-13 NOTE — Telephone Encounter (Signed)
Dr. Juleen China, please see message regarding labs and advise if I need to tell pt any additional information regarding labs.

## 2017-03-13 NOTE — Telephone Encounter (Signed)
Pt calling for lab results done on 8/31 Creat, Bun and Hemoglobin.Told pt Creat was 2.78, Bun 43 and Hgb was 9.4. Pt verbalized understanding.

## 2017-03-14 LAB — CBC WITH DIFFERENTIAL/PLATELET
Basophils Absolute: 67 cells/uL (ref 0–200)
Basophils Relative: 0.7 %
Eosinophils Absolute: 608 cells/uL — ABNORMAL HIGH (ref 15–500)
Eosinophils Relative: 6.4 %
HCT: 29.6 % — ABNORMAL LOW (ref 35.0–45.0)
Hemoglobin: 9.6 g/dL — ABNORMAL LOW (ref 11.7–15.5)
Lymphs Abs: 2147 cells/uL (ref 850–3900)
MCH: 29.1 pg (ref 27.0–33.0)
MCHC: 32.4 g/dL (ref 32.0–36.0)
MCV: 89.7 fL (ref 80.0–100.0)
MPV: 11.9 fL (ref 7.5–12.5)
Monocytes Relative: 6.7 %
Neutro Abs: 6042 cells/uL (ref 1500–7800)
Neutrophils Relative %: 63.6 %
Platelets: 248 10*3/uL (ref 140–400)
RBC: 3.3 10*6/uL — ABNORMAL LOW (ref 3.80–5.10)
RDW: 13.6 % (ref 11.0–15.0)
Total Lymphocyte: 22.6 %
WBC mixed population: 637 cells/uL (ref 200–950)
WBC: 9.5 10*3/uL (ref 3.8–10.8)

## 2017-03-14 LAB — BASIC METABOLIC PANEL WITH GFR
BUN/Creatinine Ratio: 16 (calc) (ref 6–22)
BUN: 40 mg/dL — ABNORMAL HIGH (ref 7–25)
CO2: 27 mmol/L (ref 20–32)
Calcium: 9.7 mg/dL (ref 8.6–10.4)
Chloride: 106 mmol/L (ref 98–110)
Creat: 2.54 mg/dL — ABNORMAL HIGH (ref 0.60–0.93)
GFR, Est African American: 21 mL/min/{1.73_m2} — ABNORMAL LOW (ref >=60)
GFR, Est Non African American: 18 mL/min/{1.73_m2} — ABNORMAL LOW (ref >=60)
Glucose, Bld: 121 mg/dL — ABNORMAL HIGH (ref 65–99)
Potassium: 4.8 mmol/L (ref 3.5–5.3)
Sodium: 140 mmol/L (ref 135–146)

## 2017-03-14 LAB — PHOSPHORUS: Phosphorus: 4.8 mg/dL — ABNORMAL HIGH (ref 2.1–4.3)

## 2017-03-14 NOTE — Telephone Encounter (Signed)
See lab result note. Thanks. EW

## 2017-03-20 ENCOUNTER — Other Ambulatory Visit: Payer: Self-pay

## 2017-03-20 ENCOUNTER — Telehealth: Payer: Self-pay | Admitting: Family Medicine

## 2017-03-20 ENCOUNTER — Other Ambulatory Visit: Payer: PPO

## 2017-03-20 MED ORDER — GABAPENTIN 100 MG PO CAPS: 100.0000 mg | ORAL_CAPSULE | Freq: Two times a day (BID) | ORAL | 0 refills | Status: DC

## 2017-03-20 NOTE — Telephone Encounter (Signed)
Refill escribed to patient's pharmacy.

## 2017-03-20 NOTE — Telephone Encounter (Signed)
[facility] calling on pt behalf for a new script for gabapentin.  Please advise,  Ty,  -LL

## 2017-03-26 ENCOUNTER — Other Ambulatory Visit: Payer: Self-pay

## 2017-03-26 ENCOUNTER — Telehealth: Payer: Self-pay | Admitting: Family Medicine

## 2017-03-26 DIAGNOSIS — L0390 Cellulitis, unspecified: Secondary | ICD-10-CM | POA: Diagnosis not present

## 2017-03-26 DIAGNOSIS — T84093A Other mechanical complication of internal left knee prosthesis, initial encounter: Secondary | ICD-10-CM | POA: Diagnosis not present

## 2017-03-26 DIAGNOSIS — M25562 Pain in left knee: Secondary | ICD-10-CM | POA: Diagnosis not present

## 2017-03-26 MED ORDER — GABAPENTIN 100 MG PO CAPS: 100.0000 mg | ORAL_CAPSULE | Freq: Two times a day (BID) | ORAL | 0 refills | Status: DC

## 2017-03-26 NOTE — Telephone Encounter (Signed)
MEDICATION:  gabapentin (NEURONTIN) 100 MG capsule [[id number]]   PHARMACY:   [id number] [place], [place] - [place] [phone] (Phone) [phone] (Fax)    IS THIS A 90 DAY SUPPLY :  Yes   IS PATIENT OUT OF MEDICATION:  No  IF NOT; HOW MUCH IS LEFT:  Just tonight  LAST APPOINTMENT DATE: @9 /14/2018  NEXT APPOINTMENT DATE:@9 /21/2018  OTHER COMMENTS:  Pharmacy states they need new prescription, not a refill request.    **Let patient know to contact pharmacy at the end of the day to make sure medication is ready. **  ** Please notify patient to allow 48-72 hours to process**  **Encourage patient to contact the pharmacy for refills or they can request refills through [facility]**

## 2017-03-26 NOTE — Telephone Encounter (Signed)
This has been sent to patient's pharmacy.  Patient encouraged to contact her pharmacy.  We have sent this prescription twice.

## 2017-03-27 ENCOUNTER — Other Ambulatory Visit (INDEPENDENT_AMBULATORY_CARE_PROVIDER_SITE_OTHER): Payer: PPO

## 2017-03-27 DIAGNOSIS — T8459XS Infection and inflammatory reaction due to other internal joint prosthesis, sequela: Secondary | ICD-10-CM

## 2017-03-27 DIAGNOSIS — N189 Chronic kidney disease, unspecified: Secondary | ICD-10-CM | POA: Diagnosis not present

## 2017-03-27 DIAGNOSIS — Z96659 Presence of unspecified artificial knee joint: Secondary | ICD-10-CM

## 2017-03-27 LAB — BASIC METABOLIC PANEL
BUN: 37 mg/dL — ABNORMAL HIGH (ref 6–23)
CO2: 27 mEq/L (ref 19–32)
Calcium: 9.8 mg/dL (ref 8.4–10.5)
Chloride: 104 mEq/L (ref 96–112)
Creatinine, Ser: 2.29 mg/dL — ABNORMAL HIGH (ref 0.40–1.20)
GFR: 21.97 mL/min — ABNORMAL LOW (ref >60.00)
Glucose, Bld: 97 mg/dL (ref 70–99)
Potassium: 3.8 mEq/L (ref 3.5–5.1)
Sodium: 139 mEq/L (ref 135–145)

## 2017-03-27 LAB — CBC WITH DIFFERENTIAL/PLATELET
Basophils Absolute: 0.1 10*3/uL (ref 0.0–0.1)
Basophils Relative: 1 % (ref 0.0–3.0)
Eosinophils Absolute: 0.6 10*3/uL (ref 0.0–0.7)
Eosinophils Relative: 5.6 % — ABNORMAL HIGH (ref 0.0–5.0)
HCT: 32 % — ABNORMAL LOW (ref 36.0–46.0)
Hemoglobin: 10.4 g/dL — ABNORMAL LOW (ref 12.0–15.0)
Lymphocytes Relative: 21.7 % (ref 12.0–46.0)
Lymphs Abs: 2.3 10*3/uL (ref 0.7–4.0)
MCHC: 32.5 g/dL (ref 30.0–36.0)
MCV: 89.7 fl (ref 78.0–100.0)
Monocytes Absolute: 0.9 10*3/uL (ref 0.1–1.0)
Monocytes Relative: 8.7 % (ref 3.0–12.0)
Neutro Abs: 6.8 10*3/uL (ref 1.4–7.7)
Neutrophils Relative %: 63 % (ref 43.0–77.0)
Platelets: 279 10*3/uL (ref 150.0–400.0)
RBC: 3.56 Mil/uL — ABNORMAL LOW (ref 3.87–5.11)
RDW: 14.1 % (ref 11.5–15.5)
WBC: 10.8 10*3/uL — ABNORMAL HIGH (ref 4.0–10.5)

## 2017-03-27 LAB — PHOSPHORUS: Phosphorus: 3.9 mg/dL (ref 2.3–4.6)

## 2017-04-01 NOTE — Progress Notes (Signed)
Pre visit review using our clinic review tool, if applicable. No additional management support is needed unless otherwise documented below in the visit note. 

## 2017-04-01 NOTE — Progress Notes (Signed)
Subjective:   Laura Chambers is a 76 y.o. female who presents for an Initial Medicare Annual Wellness Visit.  Review of Systems    No ROS.  Medicare Wellness Visit. Additional risk factors are reflected in the social history.  Cardiac Risk Factors include: advanced age (>43men, >34 women);hypertension     Objective:    Today's Vitals   04/03/17 1402  BP: 118/68  Pulse: 64  Resp: 16  SpO2: 96%  Weight: 198 lb (89.8 kg)  Height: 5\' 6"  (1.676 m)   Body mass index is 31.96 kg/m.   Current Medications (verified) Outpatient Encounter Prescriptions as of 04/03/2017  Medication Sig  . ALPRAZolam (XANAX) 0.25 MG tablet Take 0.25 mg by mouth at bedtime as needed for anxiety.  Marland Kitchen amLODipine (NORVASC) 10 MG tablet Take 1 tablet (10 mg total) by mouth daily.  Marland Kitchen atenolol (TENORMIN) 50 MG tablet Take 1 tablet (50 mg total) by mouth daily.  . Calcium Carbonate-Vitamin D (CALCIUM 600+D) 600-400 MG-UNIT tablet Take 1 tablet by mouth every other day.  . docusate sodium (COLACE) 100 MG capsule Take 1 capsule (100 mg total) by mouth 2 (two) times daily.  . folic acid (FOLVITE) 259 MCG tablet Take 400 mcg by mouth daily.  . furosemide (LASIX) 40 MG tablet Take 1 tablet (40 mg total) by mouth daily as needed for edema.  . gabapentin (NEURONTIN) 100 MG capsule Take 1 capsule (100 mg total) by mouth 2 (two) times daily.  Marland Kitchen HYDROcodone-acetaminophen (NORCO/VICODIN) 5-325 MG tablet Take 1 tablet by mouth every 6 (six) hours as needed for moderate pain. DO NOT EXCEED 3GM OF APAP IN 24 HOURS FROM ALL SOURCES  . Menthol, Topical Analgesic, (BIOFREEZE) 4 % GEL Add Biofreeze to BLE for pain management once a day  . Multiple Vitamin (MULTIVITAMIN WITH MINERALS) TABS tablet Take 1 tablet by mouth every other day.  . promethazine (PHENERGAN) 25 MG tablet Take 25 mg by mouth every 8 (eight) hours as needed for nausea or vomiting.   . rosuvastatin (CRESTOR) 10 MG tablet Take 1 tablet (10 mg total) by mouth  daily.  . tamsulosin (FLOMAX) 0.4 MG CAPS capsule Take 1 capsule (0.4 mg total) by mouth every evening.   No facility-administered encounter medications on file as of 04/03/2017.     Allergies (verified) Ativan [lorazepam]; Haldol [haloperidol]; Epinephrine; Levofloxacin; Lipitor [atorvastatin]; and Trovan [alatrofloxacin]   History: Past Medical History:  Diagnosis Date  . Anxiety   . Arthritis   . Blood transfusion without reported diagnosis   . Complication of anesthesia 2012   "irritated trachea" with cough x 1 year from intubation  . Esophageal hernia   . Fibromyalgia   . HTN (hypertension)   . Hx of colonic polyps   . Hydronephrosis of right kidney   . Hypercholesterolemia   . Neuropathy due to medical condition ([place])    bilateral lower legs  . Obesity   . 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 Osteoporosis   . Renal angiomyolipoma    Past Surgical History:  Procedure Laterality Date  . ABDOMINAL HYSTERECTOMY  1969  . BACK SURGERY  2012   rod in back-  lumbar disc removal with bone graft  . blephoroplasty     bilateral  . BREAST BIOPSY  1989  . CHOLECYSTECTOMY  2010  . EXCISIONAL TOTAL KNEE ARTHROPLASTY WITH ANTIBIOTIC SPACERS Left 11/14/2016   Procedure: EXCISIONAL LEFT TOTAL KNEE ARTHROPLASTY WITH  PLACEMENT OF ANTIBIOTIC SPACERS;  Surgeon: [name], MD;  Location: [facility];  Service: Orthopedics;  Laterality: Left;  . NISSEN FUNDOPLICATION    . PARAESOPHAGEAL HERNIA REPAIR  1989  . TONSILLECTOMY  1946  . TOTAL HIP ARTHROPLASTY Right 08/31/2012   Procedure: TOTAL HIP ARTHROPLASTY ANTERIOR APPROACH;  Surgeon: Mauri Pole, MD;  Location[facility];  Service: Orthopedics;  Laterality: Right;  . TOTAL KNEE ARTHROPLASTY  2001   left  . TOTAL KNEE ARTHROPLASTY  2010   right   Family History  Problem Relation Age of Onset  . Diabetes Father   . Osteoarthritis Father   . Heart disease Father   . Stroke Father   . Osteoarthritis  Mother   . Stroke Mother   . Pneumonia Mother   . Colon cancer Sister 39  . Colon polyps Unknown        neice/nephew  . Diabetes Sister   . Diabetes Brother    Social History   Occupational History  .  [place]    Dr. Minna Merritts   Social History Main Topics  . Smoking status: Never Smoker  . Smokeless tobacco: Never Used  . Alcohol use No  . Drug use: No  . Sexual activity: No    Tobacco Counseling Counseling given: Not Answered   Activities of Daily Living In your present state of health, do you have any difficulty performing the following activities: 04/03/2017 12/15/2016  Hearing? N N  Vision? N N  Difficulty concentrating or making decisions? N N  Walking or climbing stairs? Y Y  Comment Recent knee surgery, wheelchair/walker bound -  Dressing or bathing? N N  Doing errands, shopping? Y Y  Comment cannot drive -  Conservation officer, nature and eating ? N -  Using the Toilet? N -  In the past six months, have you accidently leaked urine? Y -  Do you have problems with loss of bowel control? N -  Managing your Medications? N -  Managing your Finances? N -  Housekeeping or managing your Housekeeping? N -  Some recent data might be hidden    Immunizations and Health Maintenance Immunization History  Administered Date(s) Administered  . Influenza,inj,Quad PF,6+ Mos 03/13/2017   There are no preventive care reminders to display for this patient.  Patient Care Team: Briscoe Deutscher, DO as PCP - General (Family Medicine) Paralee Cancel, MD as Consulting Physician (Orthopedic Surgery)  Indicate any recent Medical Services you may have received from other than [facility] providers in the past year (date may be approximate).     Assessment:   This is a routine wellness examination for [place]. Physical assessment deferred to PCP.  Hearing/Vision screen Hearing Screening Comments: Able to hear conversational tones w/o difficulty. No issues reported.   Vision Screening  Comments: Wears reading glasses. Pt is looking for a new ophthalmologist.   Dietary issues and exercise activities discussed: Current Exercise Habits: Home exercise routine, Type of exercise: stretching, Time (Minutes): 30, Frequency (Times/Week): 5, Weekly Exercise (Minutes/Week): 150, Intensity: Mild, Exercise limited by: orthopedic condition(s)  Goals    None     Depression Screen PHQ 2/9 Scores 04/03/2017 01/27/2017  PHQ - 2 Score 0 0    Fall Risk Fall Risk  04/03/2017 01/27/2017  Falls in the past year? Yes No    Cognitive Function: Ad8 score reviewed for issues:  Issues making decisions: no  Less interest in hobbies / activities:no  Repeats questions, stories (family complaining):no  Trouble using ordinary gadgets (microwave, computer, phone):no  Forgets the month or year: no Mismanaging finances: no  Remembering appts:no  Daily problems with thinking and/or memory:no Ad8 score is=0        Screening Tests Health Maintenance  Topic Date Due  . PNA vac Low Risk Adult (1 of 2 - PCV13) 06/06/2017 (Originally 05/18/2005)  . DEXA SCAN  04/03/2018 (Originally 05/18/2005)  . TETANUS/TDAP  04/03/2018 (Originally 05/19/1960)  . COLONOSCOPY  01/26/2019  . INFLUENZA VACCINE  Completed      Plan:   Follow up with PCP as directed.  Bring a copy of your living will and/or healthcare power of attorney to your next office visit.  I have personally reviewed and noted the following in the patient's chart:   . Medical and social history . Use of alcohol, tobacco or illicit drugs  . Current medications and supplements . Functional ability and status . Nutritional status . Physical activity . Advanced directives . List of other physicians . Vitals . Screenings to include cognitive, depression, and falls . Referrals and appointments  In addition, I have reviewed and discussed with patient certain preventive protocols, quality metrics, and best practice  recommendations. A written personalized care plan for preventive services as well as general preventive health recommendations were provided to patient.     Ree Edman, RN   04/03/2017

## 2017-04-01 NOTE — Progress Notes (Signed)
PCP notes:   Health maintenance: Tdap: Pt states she is up to date, pt will get the date from previous employer. DEXA: Pt declines this for now.  Abnormal screenings: None.   Patient concerns: right hip pain and limitations. Pt encouraged to schedule appt with PCP. States she will schedule it once she gets her sons schedule.  Nurse concerns: Pt states she normally takes 300 mg Gabapentin. Spoke with Roselyn Reef CMA, no recent RX for this. Pt stated she did not need a refill yet. Will follow up soon.   Next PCP appt: Pt will schedule acute for right hip pain and CPE.

## 2017-04-02 ENCOUNTER — Ambulatory Visit (INDEPENDENT_AMBULATORY_CARE_PROVIDER_SITE_OTHER): Payer: PPO | Admitting: Physician Assistant

## 2017-04-03 ENCOUNTER — Encounter: Payer: Self-pay | Admitting: *Deleted

## 2017-04-03 ENCOUNTER — Other Ambulatory Visit (INDEPENDENT_AMBULATORY_CARE_PROVIDER_SITE_OTHER): Payer: PPO

## 2017-04-03 ENCOUNTER — Ambulatory Visit (INDEPENDENT_AMBULATORY_CARE_PROVIDER_SITE_OTHER): Payer: PPO | Admitting: *Deleted

## 2017-04-03 VITALS — BP 118/68 | HR 64 | Resp 16 | Ht 66.0 in | Wt 198.0 lb

## 2017-04-03 DIAGNOSIS — N189 Chronic kidney disease, unspecified: Secondary | ICD-10-CM

## 2017-04-03 DIAGNOSIS — T8459XS Infection and inflammatory reaction due to other internal joint prosthesis, sequela: Secondary | ICD-10-CM

## 2017-04-03 DIAGNOSIS — Z96659 Presence of unspecified artificial knee joint: Secondary | ICD-10-CM

## 2017-04-03 DIAGNOSIS — Z0000 Encounter for general adult medical examination without abnormal findings: Secondary | ICD-10-CM | POA: Diagnosis not present

## 2017-04-03 LAB — BASIC METABOLIC PANEL
BUN: 36 mg/dL — ABNORMAL HIGH (ref 6–23)
CO2: 28 mEq/L (ref 19–32)
Calcium: 9.7 mg/dL (ref 8.4–10.5)
Chloride: 101 mEq/L (ref 96–112)
Creatinine, Ser: 2.42 mg/dL — ABNORMAL HIGH (ref 0.40–1.20)
GFR: 20.61 mL/min — ABNORMAL LOW (ref >60.00)
Glucose, Bld: 101 mg/dL — ABNORMAL HIGH (ref 70–99)
Potassium: 3.8 mEq/L (ref 3.5–5.1)
Sodium: 138 mEq/L (ref 135–145)

## 2017-04-03 LAB — CBC WITH DIFFERENTIAL/PLATELET
Basophils Absolute: 0.1 10*3/uL (ref 0.0–0.1)
Basophils Relative: 1 % (ref 0.0–3.0)
Eosinophils Absolute: 0.4 10*3/uL (ref 0.0–0.7)
Eosinophils Relative: 3.7 % (ref 0.0–5.0)
HCT: 32.9 % — ABNORMAL LOW (ref 36.0–46.0)
Hemoglobin: 10.7 g/dL — ABNORMAL LOW (ref 12.0–15.0)
Lymphocytes Relative: 18.3 % (ref 12.0–46.0)
Lymphs Abs: 2 10*3/uL (ref 0.7–4.0)
MCHC: 32.6 g/dL (ref 30.0–36.0)
MCV: 90.1 fl (ref 78.0–100.0)
Monocytes Absolute: 0.9 10*3/uL (ref 0.1–1.0)
Monocytes Relative: 8.5 % (ref 3.0–12.0)
Neutro Abs: 7.3 10*3/uL (ref 1.4–7.7)
Neutrophils Relative %: 68.5 % (ref 43.0–77.0)
Platelets: 238 10*3/uL (ref 150.0–400.0)
RBC: 3.65 Mil/uL — ABNORMAL LOW (ref 3.87–5.11)
RDW: 14.3 % (ref 11.5–15.5)
WBC: 10.7 10*3/uL — ABNORMAL HIGH (ref 4.0–10.5)

## 2017-04-03 LAB — PHOSPHORUS: Phosphorus: 3.7 mg/dL (ref 2.3–4.6)

## 2017-04-03 NOTE — Progress Notes (Signed)
I have personally reviewed the Medicare Annual Wellness questionnaire and have noted 1. The patient's medical and social history 2. Their use of alcohol, tobacco or illicit drugs 3. Their current medications and supplements 4. The patient's functional ability including ADL's, fall risks, home safety risks and hearing or visual impairment. 5. Diet and physical activities 6. Evidence for depression or mood disorders 7. Reviewed Updated provider list, see scanned forms and [facility] Snapshot.   The patients weight, height, BMI and visual acuity have been recorded in the chart I have made referrals, counseling and provided education to the patient based review of the above and I have provided the pt with a written personalized care plan for preventive services.  I have provided the patient with a copy of your personalized plan for preventive services. Instructed to take the time to review along with their updated medication list.   Meredeth Furber, D.O. Family Medicine [facility]  

## 2017-04-03 NOTE — Patient Instructions (Addendum)
Laura Chambers ,  Please schedule your physical exam with Dr Juleen China while fasting for a lipid panel.   Thank you for taking time to come for your Medicare Wellness Visit. I appreciate your ongoing commitment to your health goals. Please review the following plan we discussed and let me know if I can assist you in the future.   This is a list of the screening recommended for you and due dates:  Health Maintenance  Topic Date Due  . Pneumonia vaccines (1 of 2 - PCV13) 06/06/2017*  . DEXA scan (bone density measurement)  04/03/2018*  . Tetanus Vaccine  04/03/2018*  . Colon Cancer Screening  01/26/2019  . Flu Shot  Completed  *Topic was postponed. The date shown is not the original due date.   Preventive Care for Adults  A healthy lifestyle and preventive care can promote health and wellness. Preventive health guidelines for adults include the following key practices.  . A routine yearly physical is a good way to check with your health care provider about your health and preventive screening. It is a chance to share any concerns and updates on your health and to receive a thorough exam.  . Visit your dentist for a routine exam and preventive care every 6 months. Brush your teeth twice a day and floss once a day. Good oral hygiene prevents tooth decay and gum disease.  . The frequency of eye exams is based on your age, health, family medical history, use  of contact lenses, and other factors. Follow your health care provider's ecommendations for frequency of eye exams.  . Eat a healthy diet. Foods like vegetables, fruits, whole grains, low-fat dairy products, and lean protein foods contain the nutrients you need without too many calories. Decrease your intake of foods high in solid fats, added sugars, and salt. Eat the right amount of calories for you. Get information about a proper diet from your health care provider, if necessary.  . Regular physical exercise is one of the most important  things you can do for your health. Most adults should get at least 150 minutes of moderate-intensity exercise (any activity that increases your heart rate and causes you to sweat) each week. In addition, most adults need muscle-strengthening exercises on 2 or more days a week.  Silver Sneakers may be a benefit available to you. To determine eligibility, you may visit the website: www.silversneakers.com or contact program at 812 298 8229 Mon-Fri between 8AM-8PM.   . Maintain a healthy weight. The body mass index (BMI) is a screening tool to identify possible weight problems. It provides an estimate of body fat based on height and weight. Your health care provider can find your BMI and can help you achieve or maintain a healthy weight.   For adults 20 years and older: ? A BMI below 18.5 is considered underweight. ? A BMI of 18.5 to 24.9 is normal. ? A BMI of 25 to 29.9 is considered overweight. ? A BMI of 30 and above is considered obese.   . Maintain normal blood lipids and cholesterol levels by exercising and minimizing your intake of saturated fat. Eat a balanced diet with plenty of fruit and vegetables. Blood tests for lipids and cholesterol should begin at age 99 and be repeated every 5 years. If your lipid or cholesterol levels are high, you are over 50, or you are at high risk for heart disease, you may need your cholesterol levels checked more frequently. Ongoing high lipid and cholesterol levels should be  treated with medicines if diet and exercise are not working.  . If you smoke, find out from your health care provider how to quit. If you do not use tobacco, please do not start.  . If you choose to drink alcohol, please do not consume more than 2 drinks per day. One drink is considered to be 12 ounces (355 mL) of beer, 5 ounces (148 mL) of wine, or 1.5 ounces (44 mL) of liquor.  . If you are 26-70 years old, ask your health care provider if you should take aspirin to prevent  strokes.  . Use sunscreen. Apply sunscreen liberally and repeatedly throughout the day. You should seek shade when your shadow is shorter than you. Protect yourself by wearing long sleeves, pants, a wide-brimmed hat, and sunglasses year round, whenever you are outdoors.  . Once a month, do a whole body skin exam, using a mirror to look at the skin on your back. Tell your health care provider of new moles, moles that have irregular borders, moles that are larger than a pencil eraser, or moles that have changed in shape or color.

## 2017-04-07 ENCOUNTER — Telehealth: Payer: Self-pay | Admitting: Family Medicine

## 2017-04-07 NOTE — Telephone Encounter (Signed)
Patient calling to get results from last week with her labs. I advised the patient that the provider has not reviewed just yet however, I will let the clinical staff know to call once the results are available. Okay to leave a detailed message. Patient also goes to see her urologist on Thursday so she would like to have the results before this Thursday. Call to advise.

## 2017-04-08 NOTE — Telephone Encounter (Signed)
Please advise 

## 2017-04-08 NOTE — Telephone Encounter (Signed)
Labs stable. Okay to give results.

## 2017-04-08 NOTE — Telephone Encounter (Signed)
Patient notified of results.

## 2017-04-10 ENCOUNTER — Other Ambulatory Visit: Payer: PPO

## 2017-04-17 ENCOUNTER — Other Ambulatory Visit (INDEPENDENT_AMBULATORY_CARE_PROVIDER_SITE_OTHER): Payer: PPO

## 2017-04-17 ENCOUNTER — Encounter: Payer: Self-pay | Admitting: Physician Assistant

## 2017-04-17 ENCOUNTER — Ambulatory Visit (INDEPENDENT_AMBULATORY_CARE_PROVIDER_SITE_OTHER): Payer: PPO | Admitting: Physician Assistant

## 2017-04-17 ENCOUNTER — Telehealth: Payer: Self-pay | Admitting: Family Medicine

## 2017-04-17 VITALS — BP 138/80 | HR 69 | Temp 98.3°F

## 2017-04-17 DIAGNOSIS — T8459XS Infection and inflammatory reaction due to other internal joint prosthesis, sequela: Secondary | ICD-10-CM | POA: Diagnosis not present

## 2017-04-17 DIAGNOSIS — I872 Venous insufficiency (chronic) (peripheral): Secondary | ICD-10-CM | POA: Diagnosis not present

## 2017-04-17 DIAGNOSIS — Z96659 Presence of unspecified artificial knee joint: Secondary | ICD-10-CM | POA: Diagnosis not present

## 2017-04-17 DIAGNOSIS — N189 Chronic kidney disease, unspecified: Secondary | ICD-10-CM | POA: Diagnosis not present

## 2017-04-17 LAB — CBC WITH DIFFERENTIAL/PLATELET
Basophils Absolute: 0.1 10*3/uL (ref 0.0–0.1)
Basophils Relative: 0.9 % (ref 0.0–3.0)
Eosinophils Absolute: 0.4 10*3/uL (ref 0.0–0.7)
Eosinophils Relative: 3.5 % (ref 0.0–5.0)
HCT: 35.2 % — ABNORMAL LOW (ref 36.0–46.0)
Hemoglobin: 11.1 g/dL — ABNORMAL LOW (ref 12.0–15.0)
Lymphocytes Relative: 18.1 % (ref 12.0–46.0)
Lymphs Abs: 2.2 10*3/uL (ref 0.7–4.0)
MCHC: 31.6 g/dL (ref 30.0–36.0)
MCV: 89.6 fl (ref 78.0–100.0)
Monocytes Absolute: 0.9 10*3/uL (ref 0.1–1.0)
Monocytes Relative: 7.3 % (ref 3.0–12.0)
Neutro Abs: 8.6 10*3/uL — ABNORMAL HIGH (ref 1.4–7.7)
Neutrophils Relative %: 70.2 % (ref 43.0–77.0)
Platelets: 306 10*3/uL (ref 150.0–400.0)
RBC: 3.93 Mil/uL (ref 3.87–5.11)
RDW: 13.6 % (ref 11.5–15.5)
WBC: 12.3 10*3/uL — ABNORMAL HIGH (ref 4.0–10.5)

## 2017-04-17 LAB — BASIC METABOLIC PANEL
BUN: 34 mg/dL — ABNORMAL HIGH (ref 6–23)
CO2: 28 mEq/L (ref 19–32)
Calcium: 9.4 mg/dL (ref 8.4–10.5)
Chloride: 101 mEq/L (ref 96–112)
Creatinine, Ser: 2.13 mg/dL — ABNORMAL HIGH (ref 0.40–1.20)
GFR: 23.88 mL/min — ABNORMAL LOW (ref >60.00)
Glucose, Bld: 131 mg/dL — ABNORMAL HIGH (ref 70–99)
Potassium: 4.3 mEq/L (ref 3.5–5.1)
Sodium: 140 mEq/L (ref 135–145)

## 2017-04-17 LAB — PHOSPHORUS: Phosphorus: 5 mg/dL — ABNORMAL HIGH (ref 2.3–4.6)

## 2017-04-17 MED ORDER — HYDROCODONE-ACETAMINOPHEN 5-325 MG PO TABS: 1.0000 | ORAL_TABLET | Freq: Four times a day (QID) | ORAL | 0 refills | Status: DC | PRN

## 2017-04-17 MED ORDER — MUPIROCIN 2 % EX OINT: TOPICAL_OINTMENT | CUTANEOUS | 0 refills | Status: DC

## 2017-04-17 MED ORDER — DOXYCYCLINE HYCLATE 100 MG PO TABS: 100.0000 mg | ORAL_TABLET | Freq: Two times a day (BID) | ORAL | 0 refills | Status: DC

## 2017-04-17 NOTE — Telephone Encounter (Signed)
Patient came and picked up rx.

## 2017-04-17 NOTE — Patient Instructions (Signed)
It was great to see you!  Please elevate feet above heart as much as you are able to.  Stop antibiotic cream from home and listerine :)  Start prescription cream 1-2 times daily  After a few days, begin using compression hose again  Start 20 mg lasix (1/2 tablet) every other day for 1 week to help with swelling.  Reasons to start the oral antibiotic: fever, chills, worsening redness, discharge from wounds that look like pus or smell bad   Follow these instructions at home: Wound care  Follow instructions from your health care provider about: ? How to take care of your wound. ? When and how you should change your bandage (dressing). ? When you should remove your dressing. If your dressing is dry and sticks to your leg when you try to remove it, moisten or wet the dressing with saline solution or water so that the dressing can be removed without harming your skin or wound tissue.  Check your wound every day for signs of infection. Have a caregiver do this for you if you are not able to do it yourself. Check for: ? More redness, swelling, or pain. ? More fluid or blood. ? Pus, warmth, or a bad smell. Medicines  Take over-the-counter and prescription medicines only as told by your health care provider.  If you were prescribed an antibiotic medicine, take it or apply it as told by your health care provider. Do not stop taking or using the antibiotic even if your condition improves. Activity  Do not stand or sit in one position for a long period of time. Rest with your legs raised during the day. If possible, keep your legs above your heart for 30 minutes, 3-4 times a day, or as told by your health care provider.  Do not sit with your legs crossed.  Walk often to increase the blood flow in your legs.Ask your health care provider what level of activity is safe for you.  If you are taking a long ride in a car or plane, take a break to walk around at least once every two hours, or as  often as your health care provider recommends. Ask your health care provider if you should take aspirin before long trips. General instructions   Wear elastic stockings, compression stockings, or support hose as told by your health care provider. This is very important.  Raise the foot of your bed as told by your health care provider.  Do not smoke.  Keep all follow-up visits as told by your health care provider. This is important. Contact a health care provider if:  You have a fever.  Your ulcer is getting larger or is not healing.  Your pain gets worse.  You have more redness or swelling around your ulcer.  You have more fluid, blood, or pus coming from your ulcer after it has been cleaned by you or your health care provider.  You have warmth or a bad smell coming from your ulcer.

## 2017-04-17 NOTE — Progress Notes (Signed)
Laura Chambers is a 76 y.o. female here to check lower legs.  I acted as a Education administrator for [employer], PA-C Anselmo Pickler, LPN  History of Present Illness:   Chief Complaint  Patient presents with  . Edema    Bilateral lower legs and feet    Subjective: Bilateral lower extremity edema   Laura Chambers is a 76 y.o. female who presents for evaluation of edema in both lower legs and feet. Bilateral lower legs are swollen, red and have some small fluid filled blisters that open at times.The edema has been moderate. Onset of symptoms was 04/10/2017 and patient reports symptoms have gradually worsened since in the past week.  The edema is present since surgery in May. The swelling has been aggravated by dependency of involved area. The swelling has been relieved some by diuretics, elevation of involved area. Associated factors include: Hypertension, Low Kidney function. Cardiac risk factors: dyslipidemia.  Patient has been using triple antibiotic ointment and listerine to clean wounds. She does note that after using these products, the area has worsened.   She denies: fevers, chills, pus, worsening redness, hx of blood clots, unusual numbness/tingling of extremities.    Past Medical History:  Diagnosis Date  . Anxiety   . Arthritis   . Blood transfusion without reported diagnosis   . Complication of anesthesia 2012   "irritated trachea" with cough x 1 year from intubation  . Esophageal hernia   . Fibromyalgia   . HTN (hypertension)   . Hx of colonic polyps   . Hydronephrosis of right kidney   . Hypercholesterolemia   . 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 Obesity   . 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 Osteoporosis   . Renal angiomyolipoma      Social History   Social History  . Marital status: Divorced    Spouse name: N/A  . Number of children: 3  . Years of education: College   Occupational History  .   [employer]    [employer]. Minna Merritts   Social History Main Topics  . Smoking status: Never Smoker  . Smokeless tobacco: Never Used  . Alcohol use No  . Drug use: No  . Sexual activity: No   Other Topics Concern  . Not on file   Social History Narrative   Patient lives at home alone.   Caffeine Use: 1 cup daily    Past Surgical History:  Procedure Laterality Date  . ABDOMINAL HYSTERECTOMY  1969  . BACK SURGERY  2012   rod in back-  lumbar disc removal with bone graft  . blephoroplasty     bilateral  . BREAST BIOPSY  1989  . CHOLECYSTECTOMY  2010  . EXCISIONAL TOTAL KNEE ARTHROPLASTY WITH ANTIBIOTIC SPACERS Left 11/14/2016   Procedure: EXCISIONAL LEFT TOTAL KNEE ARTHROPLASTY WITH  PLACEMENT OF ANTIBIOTIC SPACERS;  Surgeon: [name], MD;  Location: [facility];  Service: Orthopedics;  Laterality: Left;  . NISSEN FUNDOPLICATION    . PARAESOPHAGEAL HERNIA REPAIR  1989  . TONSILLECTOMY  1946  . TOTAL HIP ARTHROPLASTY Right 08/31/2012   Procedure: TOTAL HIP ARTHROPLASTY ANTERIOR APPROACH;  Surgeon: Mauri Pole, MD;  Location: [facility];  Service: Orthopedics;  Laterality: Right;  . TOTAL KNEE ARTHROPLASTY  2001   left  . TOTAL KNEE ARTHROPLASTY  2010   right    Family History  Problem Relation Age of Onset  .  Diabetes Father   . Osteoarthritis Father   . Heart disease Father   . Stroke Father   . Osteoarthritis Mother   . Stroke Mother   . Pneumonia Mother   . Colon cancer Sister 55  . Colon polyps Unknown        neice/nephew  . Diabetes Sister   . Diabetes Brother     Allergies  Allergen Reactions  . Ativan [Lorazepam] Other (See Comments)    Highly irritable  . Haldol [Haloperidol] Other (See Comments)    Vegetative State  . Epinephrine Other (See Comments)    Reaction:  Increased pts BP and caused shoulder to jerk   . Levofloxacin Nausea And Vomiting  . Lipitor [Atorvastatin] Other (See Comments)    Reaction:  Joint pain and dizziness   . Trovan  [Alatrofloxacin] Nausea And Vomiting    Pt states that this med caused pancreatitis.    Current Medications:   Current Outpatient Prescriptions:  .  ALPRAZolam (XANAX) 0.25 MG tablet, Take 0.25 mg by mouth at bedtime as needed for anxiety., Disp: , Rfl:  .  amLODipine (NORVASC) 10 MG tablet, Take 1 tablet (10 mg total) by mouth daily., Disp: 90 tablet, Rfl: 2 .  atenolol (TENORMIN) 50 MG tablet, Take 1 tablet (50 mg total) by mouth daily., Disp: 90 tablet, Rfl: 0 .  Calcium Carbonate-Vitamin D (CALCIUM 600+D) 600-400 MG-UNIT tablet, Take 1 tablet by mouth every other day., Disp: , Rfl:  .  docusate sodium (COLACE) 100 MG capsule, Take 1 capsule (100 mg total) by mouth 2 (two) times daily., Disp: 10 capsule, Rfl: 0 .  folic acid (FOLVITE) 956 MCG tablet, Take 400 mcg by mouth daily., Disp: , Rfl:  .  furosemide (LASIX) 40 MG tablet, Take 1 tablet (40 mg total) by mouth daily as needed for edema., Disp: 90 tablet, Rfl: 0 .  gabapentin (NEURONTIN) 100 MG capsule, Take 1 capsule (100 mg total) by mouth 2 (two) times daily., Disp: 180 capsule, Rfl: 0 .  HYDROcodone-acetaminophen (NORCO/VICODIN) 5-325 MG tablet, Take 1 tablet by mouth every 6 (six) hours as needed for moderate pain. DO NOT EXCEED 3GM OF APAP IN 24 HOURS FROM ALL SOURCES, Disp: 20 tablet, Rfl: 0 .  Menthol, Topical Analgesic, (BIOFREEZE) 4 % GEL, Add Biofreeze to BLE for pain management once a day, Disp: , Rfl:  .  Multiple Vitamin (MULTIVITAMIN WITH MINERALS) TABS tablet, Take 1 tablet by mouth every other day., Disp: , Rfl:  .  promethazine (PHENERGAN) 25 MG tablet, Take 25 mg by mouth every 8 (eight) hours as needed for nausea or vomiting. , Disp: , Rfl: 0 .  rosuvastatin (CRESTOR) 10 MG tablet, Take 1 tablet (10 mg total) by mouth daily., Disp: 90 tablet, Rfl: 1 .  tamsulosin (FLOMAX) 0.4 MG CAPS capsule, Take 1 capsule (0.4 mg total) by mouth every evening., Disp: 90 capsule, Rfl: 0 .  doxycycline (VIBRA-TABS) 100 MG tablet, Take  1 tablet (100 mg total) by mouth 2 (two) times daily., Disp: 20 tablet, Rfl: 0 .  mupirocin ointment (BACTROBAN) 2 %, Apply to affected area 1-2 times daily as needed., Disp: 22 g, Rfl: 0   Review of Systems:   ROS Negative unless otherwise specified in HPI.   Vitals:   Vitals:   04/17/17 1449  BP: 138/80  Pulse: 69  Temp: 98.3 F (36.8 C)  TempSrc: Oral  SpO2: 97%     There is no height or weight on file to calculate BMI.  Physical Exam:   Physical Exam  Constitutional: She appears well-developed. She is cooperative.  Non-toxic appearance. She does not have a sickly appearance. She does not appear ill. No distress.  Cardiovascular: Normal rate, regular rhythm, S1 normal, S2 normal, normal heart sounds and normal pulses.   No LE edema  Pulmonary/Chest: Effort normal and breath sounds normal.  Musculoskeletal:  Negative Homan's sign bilaterally.  Neurological: She is alert. GCS eye subscore is 4. GCS verbal subscore is 5. GCS motor subscore is 6.  Pulses present 2+ in LE bilaterally.  Skin: Skin is warm, dry and intact.  Bilateral lower legs with symmetrical edema. Erythema and superficial blistering present on bilateral shins. No discharge present, or odors/streaking.   No calf tenderness or erythema present.  Psychiatric: She has a normal mood and affect. Her speech is normal and behavior is normal.  Nursing note and vitals reviewed.   Assessment and Plan:    [place] was seen today for edema.  Diagnoses and all orders for this visit:  Venous stasis dermatitis of both lower extremities Discussed plan of care with Dr. Briscoe Deutscher, who was also in to see patient. Advised patient to stop use of triple antibiotic cream and listerine. Start elevating feet. Bactroban cream prescription provided today. Doxycycline safety net prescription and guidelines on when to start were provided. Advised patient to call office or seek medical care if symptoms worsen. Begin 20 mg lasix  (1/2 dose) q other day x 1 week. Follow-up as needed.  Other orders -     mupirocin ointment (BACTROBAN) 2 %; Apply to affected area 1-2 times daily as needed. -     doxycycline (VIBRA-TABS) 100 MG tablet; Take 1 tablet (100 mg total) by mouth 2 (two) times daily.    . Reviewed expectations re: course of current medical issues. . Discussed self-management of symptoms. . Outlined signs and symptoms indicating need for more acute intervention. . Patient verbalized understanding and all questions were answered. . See orders for this visit as documented in the electronic medical record. . Patient received an After-Visit Summary.  CMA or LPN served as scribe during this visit. History, Physical, and Plan performed by medical provider. Documentation and orders reviewed and attested to.  Inda Coke, PA-C

## 2017-04-17 NOTE — Telephone Encounter (Signed)
Okay refill. 

## 2017-04-17 NOTE — Telephone Encounter (Signed)
Please advise on refill.

## 2017-04-17 NOTE — Telephone Encounter (Signed)
MEDICATION: HYDROcodone-acetaminophen (NORCO/VICODIN) 5-325 MG tablet  PHARMACY:  [facility]  IS THIS A 90 DAY SUPPLY : yes  IS PATIENT OUT OF MEDICATION: no  IF NOT; HOW MUCH IS LEFT: 3 more left  LAST APPOINTMENT DATE: @8 /2/18  NEXT APPOINTMENT DATE:@10 /05/2018  OTHER COMMENTS: Patient is coming today for labs and wants to pick it up at her arrival.   **Let patient know to contact pharmacy at the end of the day to make sure medication is ready. **  ** Please notify patient to allow 48-72 hours to process**  **Encourage patient to contact the pharmacy for refills or they can request refills through [facility]**

## 2017-04-20 ENCOUNTER — Telehealth: Payer: Self-pay | Admitting: *Deleted

## 2017-04-20 NOTE — Telephone Encounter (Signed)
Noted.  Will fax tomorrow, 04/21/2017.

## 2017-04-20 NOTE — Telephone Encounter (Signed)
Patient called and states she needs proof of her flu shot emailed or fax to her employer at [employer]. Patient has trouble with transportation unable to pick up. The e-mail address is [email or web address].. The fax number is [phone]  If you have any questions you can call the patient. Her contact number is [phone]. Please advise. Thank you . please send tomorrow due to power outage at that location

## 2017-04-21 NOTE — Telephone Encounter (Signed)
Faxed to [phone].

## 2017-04-21 NOTE — Progress Notes (Signed)
To

## 2017-04-23 ENCOUNTER — Ambulatory Visit (INDEPENDENT_AMBULATORY_CARE_PROVIDER_SITE_OTHER): Payer: PPO | Admitting: Orthopaedic Surgery

## 2017-04-23 ENCOUNTER — Ambulatory Visit (INDEPENDENT_AMBULATORY_CARE_PROVIDER_SITE_OTHER): Payer: PPO

## 2017-04-23 DIAGNOSIS — M25562 Pain in left knee: Secondary | ICD-10-CM

## 2017-04-23 DIAGNOSIS — G8929 Other chronic pain: Secondary | ICD-10-CM

## 2017-04-23 DIAGNOSIS — Z96659 Presence of unspecified artificial knee joint: Secondary | ICD-10-CM | POA: Diagnosis not present

## 2017-04-23 DIAGNOSIS — T8459XS Infection and inflammatory reaction due to other internal joint prosthesis, sequela: Secondary | ICD-10-CM

## 2017-04-23 NOTE — Progress Notes (Signed)
The patient is well-known to me. 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 This had to be removed and a temporary antibiotic cement spacer placed. This was 5 months ago. We have avoided going back to the OR until her serum creatinine improved. She's been on IV antibiotics and now is just on oral antibodies. She is ready for her knee revision of this point.  On examination there is no evidence of residual infection of her left knee. Incisions well-healed. There is no redness. Her pain is minimal. X-rays show an intact knee with an antibiotic cement spacer in place. She does have some dermatitis of her bilateral lower extremities of the ankles that this is not worrisome for my standpoint.  We talked about revision surgery and a long thorough discussion of the risk and benefits of the surgery. She is someone that I would be aggressive with in terms of a transfusion if needed to help with her creatinine and limiting exposure to the antibiotics they can cause issues with creatinine clearance. We will work on getting the surgery set up in the near future. All questions and concerns were answered and addressed.

## 2017-04-25 DIAGNOSIS — T84093A Other mechanical complication of internal left knee prosthesis, initial encounter: Secondary | ICD-10-CM | POA: Diagnosis not present

## 2017-04-25 DIAGNOSIS — M25562 Pain in left knee: Secondary | ICD-10-CM | POA: Diagnosis not present

## 2017-04-25 DIAGNOSIS — L0390 Cellulitis, unspecified: Secondary | ICD-10-CM | POA: Diagnosis not present

## 2017-04-30 ENCOUNTER — Telehealth: Payer: Self-pay | Admitting: Family Medicine

## 2017-04-30 NOTE — Telephone Encounter (Signed)
Patient wants labs done, however, orders are from nursing home?  Please call back to assist her.   Ty,  -LL

## 2017-04-30 NOTE — Telephone Encounter (Signed)
Do you want to continue to get labs? If so what labs?

## 2017-05-01 ENCOUNTER — Other Ambulatory Visit: Payer: Self-pay | Admitting: Radiology

## 2017-05-01 ENCOUNTER — Telehealth: Payer: Self-pay | Admitting: Radiology

## 2017-05-01 DIAGNOSIS — T8459XS Infection and inflammatory reaction due to other internal joint prosthesis, sequela: Secondary | ICD-10-CM

## 2017-05-01 DIAGNOSIS — I120 Hypertensive chronic kidney disease with stage 5 chronic kidney disease or end stage renal disease: Secondary | ICD-10-CM

## 2017-05-01 DIAGNOSIS — N185 Chronic kidney disease, stage 5: Secondary | ICD-10-CM

## 2017-05-01 DIAGNOSIS — Z96659 Presence of unspecified artificial knee joint: Principal | ICD-10-CM

## 2017-05-01 NOTE — Telephone Encounter (Signed)
Spoke with Dr Juleen China and confirmed orders. Placed orders for Dr Juleen China to sign. Called Pt back and made appt on lab schedule for 05/08/2017 @ 2:30.

## 2017-05-01 NOTE — Telephone Encounter (Signed)
Pt requesting CBC,BMP, & phosphorus labwork. Pt states Dr Juleen China has been ordering these labs as a courtesy for urology. Pt wants to be added to lab schedule  05/08/2017. No labs placed at this time. Please call Pt to confirm if labs will be able to be done.

## 2017-05-01 NOTE — Telephone Encounter (Signed)
Do you want to order labs

## 2017-05-05 ENCOUNTER — Encounter (HOSPITAL_COMMUNITY): Payer: Self-pay | Admitting: *Deleted

## 2017-05-05 NOTE — Progress Notes (Signed)
Please place orders in EPIC as patient is being scheduled for a pre-op appointment! Thank you! 

## 2017-05-07 ENCOUNTER — Other Ambulatory Visit (INDEPENDENT_AMBULATORY_CARE_PROVIDER_SITE_OTHER): Payer: Self-pay | Admitting: Physician Assistant

## 2017-05-08 ENCOUNTER — Other Ambulatory Visit (INDEPENDENT_AMBULATORY_CARE_PROVIDER_SITE_OTHER): Payer: PPO

## 2017-05-08 DIAGNOSIS — N185 Chronic kidney disease, stage 5: Secondary | ICD-10-CM

## 2017-05-08 DIAGNOSIS — T8459XS Infection and inflammatory reaction due to other internal joint prosthesis, sequela: Secondary | ICD-10-CM | POA: Diagnosis not present

## 2017-05-08 DIAGNOSIS — I120 Hypertensive chronic kidney disease with stage 5 chronic kidney disease or end stage renal disease: Secondary | ICD-10-CM | POA: Diagnosis not present

## 2017-05-08 DIAGNOSIS — Z96659 Presence of unspecified artificial knee joint: Secondary | ICD-10-CM

## 2017-05-08 LAB — CBC WITH DIFFERENTIAL/PLATELET
Basophils Absolute: 0.1 10*3/uL (ref 0.0–0.1)
Basophils Relative: 0.8 % (ref 0.0–3.0)
Eosinophils Absolute: 0.4 10*3/uL (ref 0.0–0.7)
Eosinophils Relative: 3.4 % (ref 0.0–5.0)
HCT: 32.9 % — ABNORMAL LOW (ref 36.0–46.0)
Hemoglobin: 10.6 g/dL — ABNORMAL LOW (ref 12.0–15.0)
Lymphocytes Relative: 20.7 % (ref 12.0–46.0)
Lymphs Abs: 2.3 10*3/uL (ref 0.7–4.0)
MCHC: 32.2 g/dL (ref 30.0–36.0)
MCV: 89.4 fl (ref 78.0–100.0)
Monocytes Absolute: 0.9 10*3/uL (ref 0.1–1.0)
Monocytes Relative: 8 % (ref 3.0–12.0)
Neutro Abs: 7.5 10*3/uL (ref 1.4–7.7)
Neutrophils Relative %: 67.1 % (ref 43.0–77.0)
Platelets: 228 10*3/uL (ref 150.0–400.0)
RBC: 3.68 Mil/uL — ABNORMAL LOW (ref 3.87–5.11)
RDW: 14.2 % (ref 11.5–15.5)
WBC: 11.2 10*3/uL — ABNORMAL HIGH (ref 4.0–10.5)

## 2017-05-08 LAB — BASIC METABOLIC PANEL
BUN: 41 mg/dL — ABNORMAL HIGH (ref 6–23)
CO2: 31 mEq/L (ref 19–32)
Calcium: 9.8 mg/dL (ref 8.4–10.5)
Chloride: 105 mEq/L (ref 96–112)
Creatinine, Ser: 1.82 mg/dL — ABNORMAL HIGH (ref 0.40–1.20)
GFR: 28.63 mL/min — ABNORMAL LOW (ref >60.00)
Glucose, Bld: 91 mg/dL (ref 70–99)
Potassium: 4.4 mEq/L (ref 3.5–5.1)
Sodium: 140 mEq/L (ref 135–145)

## 2017-05-08 LAB — PHOSPHORUS: Phosphorus: 3.7 mg/dL (ref 2.3–4.6)

## 2017-05-14 ENCOUNTER — Other Ambulatory Visit: Payer: Self-pay | Admitting: Family Medicine

## 2017-05-15 ENCOUNTER — Inpatient Hospital Stay (HOSPITAL_COMMUNITY): Admission: RE | Admit: 2017-05-15 | Payer: PPO | Source: Ambulatory Visit

## 2017-05-15 NOTE — Progress Notes (Signed)
12-27-16 (EPIC) ECHO

## 2017-05-15 NOTE — Patient Instructions (Signed)
Laura Chambers  05/15/2017   Your procedure is scheduled on: 05-22-17   Report to [facility] Main  Entrance Report to Admitting at 8:30 AM   Call this number if you have problems the morning of surgery [phone]   Remember: ONLY 1 PERSON MAY GO WITH YOU TO SHORT STAY TO GET  READY MORNING OF YOUR SURGERY.  Do not eat food or drink liquids :After Midnight.     Take these medicines the morning of surgery with A SIP OF WATER: Amlodipine (Norvasc), Atenolol (Tenormin), Gabapentin (Neurontin), and Xanax as needed.                                You may not have any metal on your body including hair pins and              piercings  Do not wear jewelry, make-up, lotions, powders or perfumes, deodorant             Do not wear nail polish.  Do not shave  48 hours prior to surgery.                Do not bring valuables to the hospital. [place].  Contacts, dentures or bridgework may not be worn into surgery.  Leave suitcase in the car. After surgery it may be brought to your room.                Please read over the following fact sheets you were given: _____________________________________________________________________             [facility] - Preparing for Surgery Before surgery, you can play an important role.  Because skin is not sterile, your skin needs to be as free of germs as possible.  You can reduce the number of germs on your skin by washing with CHG (chlorahexidine gluconate) soap before surgery.  CHG is an antiseptic cleaner which kills germs and bonds with the skin to continue killing germs even after washing. Please DO NOT use if you have an allergy to CHG or antibacterial soaps.  If your skin becomes reddened/irritated stop using the CHG and inform your nurse when you arrive at Short Stay. Do not shave (including legs and underarms) for at least 48 hours prior to the first CHG shower.  You  may shave your face/neck. Please follow these instructions carefully:  1.  Shower with CHG Soap the night before surgery and the  morning of Surgery.  2.  If you choose to wash your hair, wash your hair first as usual with your  normal  shampoo.  3.  After you shampoo, rinse your hair and body thoroughly to remove the  shampoo.                           4.  Use CHG as you would any other liquid soap.  You can apply chg directly  to the skin and wash                       Gently with a scrungie or clean washcloth.  5.  Apply the CHG Soap to your body ONLY FROM THE  NECK DOWN.   Do not use on face/ open                           Wound or open sores. Avoid contact with eyes, ears mouth and genitals (private parts).                       Wash face,  Genitals (private parts) with your normal soap.             6.  Wash thoroughly, paying special attention to the area where your surgery  will be performed.  7.  Thoroughly rinse your body with warm water from the neck down.  8.  DO NOT shower/wash with your normal soap after using and rinsing off  the CHG Soap.                9.  Pat yourself dry with a clean towel.            10.  Wear clean pajamas.            11.  Place clean sheets on your bed the night of your first shower and do not  sleep with pets. Day of Surgery : Do not apply any lotions/deodorants the morning of surgery.  Please wear clean clothes to the hospital/surgery center.  FAILURE TO FOLLOW THESE INSTRUCTIONS MAY RESULT IN THE CANCELLATION OF YOUR SURGERY PATIENT SIGNATURE_________________________________  NURSE SIGNATURE__________________________________  ________________________________________________________________________   Adam Phenix  An incentive spirometer is a tool that can help keep your lungs clear and active. This tool measures how well you are filling your lungs with each breath. Taking long deep breaths may help reverse or decrease the chance of  developing breathing (pulmonary) problems (especially infection) following:  A long period of time when you are unable to move or be active. BEFORE THE PROCEDURE   If the spirometer includes an indicator to show your best effort, your nurse or respiratory therapist will set it to a desired goal.  If possible, sit up straight or lean slightly forward. Try not to slouch.  Hold the incentive spirometer in an upright position. INSTRUCTIONS FOR USE  1. Sit on the edge of your bed if possible, or sit up as far as you can in bed or on a chair. 2. Hold the incentive spirometer in an upright position. 3. Breathe out normally. 4. Place the mouthpiece in your mouth and seal your lips tightly around it. 5. Breathe in slowly and as deeply as possible, raising the piston or the ball toward the top of the column. 6. Hold your breath for 3-5 seconds or for as long as possible. Allow the piston or ball to fall to the bottom of the column. 7. Remove the mouthpiece from your mouth and breathe out normally. 8. Rest for a few seconds and repeat Steps 1 through 7 at least 10 times every 1-2 hours when you are awake. Take your time and take a few normal breaths between deep breaths. 9. The spirometer may include an indicator to show your best effort. Use the indicator as a goal to work toward during each repetition. 10. After each set of 10 deep breaths, practice coughing to be sure your lungs are clear. If you have an incision (the cut made at the time of surgery), support your incision when coughing by placing a pillow or rolled up towels firmly against it. Once you are able  to get out of bed, walk around indoors and cough well. You may stop using the incentive spirometer when instructed by your caregiver.  RISKS AND COMPLICATIONS  Take your time so you do not get dizzy or light-headed.  If you are in pain, you may need to take or ask for pain medication before doing incentive spirometry. It is harder to take a  deep breath if you are having pain. AFTER USE  Rest and breathe slowly and easily.  It can be helpful to keep track of a log of your progress. Your caregiver can provide you with a simple table to help with this. If you are using the spirometer at home, follow these instructions: [facility] IF:   You are having difficultly using the spirometer.  You have trouble using the spirometer as often as instructed.  Your pain medication is not giving enough relief while using the spirometer.  You develop fever of 100.5 F (38.1 C) or higher. SEEK IMMEDIATE MEDICAL CARE IF:   You cough up bloody sputum that had not been present before.  You develop fever of 102 F (38.9 C) or greater.  You develop worsening pain at or near the incision site. MAKE SURE YOU:   Understand these instructions.  Will watch your condition.  Will get help right away if you are not doing well or get worse. Document Released: 11/03/2006 Document Revised: 09/15/2011 Document Reviewed: 01/04/2007 [facility] Patient Information 2014 [place], [place].   ________________________________________________________________________

## 2017-05-18 ENCOUNTER — Telehealth: Payer: Self-pay | Admitting: Family Medicine

## 2017-05-18 ENCOUNTER — Other Ambulatory Visit: Payer: Self-pay

## 2017-05-18 ENCOUNTER — Other Ambulatory Visit: Payer: Self-pay | Admitting: Family Medicine

## 2017-05-18 MED ORDER — TAMSULOSIN HCL 0.4 MG PO CAPS: 0.4000 mg | ORAL_CAPSULE | Freq: Every evening | ORAL | 0 refills | Status: DC

## 2017-05-18 MED ORDER — AMLODIPINE BESYLATE 10 MG PO TABS: 10.0000 mg | ORAL_TABLET | Freq: Every day | ORAL | 2 refills | Status: DC

## 2017-05-18 MED ORDER — GABAPENTIN 300 MG PO CAPS: 300.0000 mg | ORAL_CAPSULE | Freq: Two times a day (BID) | ORAL | 1 refills | Status: DC

## 2017-05-18 MED ORDER — ROSUVASTATIN CALCIUM 10 MG PO TABS: 10.0000 mg | ORAL_TABLET | Freq: Every day | ORAL | 0 refills | Status: DC

## 2017-05-18 MED ORDER — FUROSEMIDE 40 MG PO TABS: 40.0000 mg | ORAL_TABLET | Freq: Every day | ORAL | 0 refills | Status: DC | PRN

## 2017-05-18 NOTE — Telephone Encounter (Signed)
Rx faxed for gabapentin 300 mg BID.  LM for patient to return call regarding Norco.

## 2017-05-18 NOTE — Telephone Encounter (Signed)
MEDICATION: Gabapentin 300mg   PHARMACY:  [facility] [id number] [place] A 90 DAY SUPPLY : no  IS PATIENT OUT OF MEDICATION: yes  IF NOT; HOW MUCH IS LEFT:   LAST APPOINTMENT DATE: @11 /06/2017  NEXT APPOINTMENT DATE:@10 /05/2018  OTHER COMMENTS: Has to be written or faxed script. She prefers a 90 day supply. Says the script has been in incorrect from some time now. Please call to discuss.   MEDICATION: rosuvastatin (CRESTOR) 10 MG tablet   PH[facility]ACY:  [id number] [place] A 90 DAY SUPPLY : no  IS PATIENT OUT OF MEDICATION: no  IF NOT; HOW MUCH IS LEFT: 6 pills  LAST APPOINTMENT DATE: @11 /06/2017  NEXT APPOINTMENT DATE:@10 /05/2018  OTHER COMMENTS: Patient would like a 90 day supply.   MEDICATION: Amlodipine  PHARMACY:  [id number] [place] A 90 DAY SUPPLY :   IS PATIENT OUT OF MEDICATION: no  IF NOT; HOW MUCH IS LEFT: 7 pills  LAST APPOINTMENT DATE: @11 /06/2017  NEXT APPOINTMENT DATE:@10 /05/2018  OTHER COMMENTS: Is having surgery this Friday 05/23/2017 so she would like these called in as soon as possible.   MEDICATION: HYDROcodone-acetaminophen (NORCO/VICODIN) 5-325 MG tablet  [id number] [place]  IS THIS A 90 DAY SUPPLY : no  IS PATIENT OUT OF MEDICATION: yes  IF NOT; HOW MUCH IS LEFT: 0  LAST APPOINTMENT DATE: @11 /06/2017  NEXT APPOINTMENT DATE:@10 /05/2018  OTHER COMMENTS: Son "Brad" can pick up for her this afternoon.   **Let patient know to contact pharmacy at the end of the day to make sure medication is ready. **  ** Please notify patient to allow 48-72 hours to process**  **Encourage patient to contact the pharmacy for refills or they can request refills through [facility]**

## 2017-05-18 NOTE — Telephone Encounter (Signed)
MEDICATION:  gabapentin (NEURONTIN) 300 MG capsule amLODipine (NORVASC) 10 MG tablet  PHARMACY:   [id number] [place] [place] (Phone) [phone] (Fax)    IS THIS A 90 DAY SUPPLY : Y (if possible)  IS PATIENT OUT OF MEDICATION: Y for gabapentin   IF NOT; HOW MUCH IS LEFT: 7 left of amlodipine   LAST APPOINTMENT DATE: @11 /06/2017  NEXT APPOINTMENT DATE:@Visit  date not found  OTHER COMMENTS: Patient has been out of gabapentin since last week    **Let patient know to contact pharmacy at the end of the day to make sure medication is ready. **  ** Please notify patient to allow 48-72 hours to process**  **Encourage patient to contact the pharmacy for refills or they can request refills through [facility]**

## 2017-05-18 NOTE — Telephone Encounter (Signed)
The first three requests are fine.  The Norco - please get information re: frequency of use and if chronic. If I refill, will need to have pain contract. Upcoming knee surgery on the books. Am I the one prescribing this?

## 2017-05-18 NOTE — Telephone Encounter (Signed)
Spoke with patient.  States she was taking gabapentin 100 mg BID in the nursing home, but it was increased to 300 mg BID by Dr. Jaynee Eagles for trigeminal and occipital neuralgia.  She would like her prescription corrected to 300 mg BID.  She is also requesting a refill on her hydrocodone.  Please advise on both.

## 2017-05-18 NOTE — Telephone Encounter (Signed)
These have already been filled.

## 2017-05-19 ENCOUNTER — Encounter (HOSPITAL_COMMUNITY)
Admission: RE | Admit: 2017-05-19 | Discharge: 2017-05-19 | Disposition: A | Discharge status: Home / Self Care | Payer: PPO | Source: Ambulatory Visit | Attending: Orthopaedic Surgery | Admitting: Orthopaedic Surgery

## 2017-05-19 ENCOUNTER — Other Ambulatory Visit: Payer: Self-pay

## 2017-05-19 MED ORDER — HYDROCODONE-ACETAMINOPHEN 5-325 MG PO TABS: 1.0000 | ORAL_TABLET | Freq: Four times a day (QID) | ORAL | 0 refills | Status: DC | PRN

## 2017-05-19 NOTE — Telephone Encounter (Signed)
Forwarding for documentation.  °

## 2017-05-19 NOTE — Telephone Encounter (Signed)
Okay to refill as discussed. 30 day Rx.

## 2017-05-19 NOTE — Telephone Encounter (Signed)
Per patient, she has cancelled her surgery because she has a sinus infection.  Also states her son passed away last week.  She takes the Norco at night to help her sleep.  Has difficulty sleeping due to knee pain.  She says she hopes her surgery will be rescheduled for 2-3 weeks from now.    There is a signed pain contract in the patient's chart from when patient saw Inda Coke, [place].  Norco was previously filled by Dr. Juleen China, and before that, Inda Coke, [place].

## 2017-05-19 NOTE — Telephone Encounter (Signed)
Refill printed and placed up front for pick up.  Patient notified.

## 2017-05-26 DIAGNOSIS — M25562 Pain in left knee: Secondary | ICD-10-CM | POA: Diagnosis not present

## 2017-05-26 DIAGNOSIS — T84093A Other mechanical complication of internal left knee prosthesis, initial encounter: Secondary | ICD-10-CM | POA: Diagnosis not present

## 2017-05-26 DIAGNOSIS — L0390 Cellulitis, unspecified: Secondary | ICD-10-CM | POA: Diagnosis not present

## 2017-06-03 NOTE — Pre-Procedure Instructions (Signed)
Merari Pion Sudano  06/03/2017      [id number] [place] [place] [facility] [address] [place] [address] Phone: [phone] Fax: [phone]  [place], [place] - [address] [place] [address] Phone: [phone] Fax: [phone]    Your procedure is scheduled on Tuesday, June 09, 2017  Report to [facility] Admitting Entrance "A" at 5:30AM  Call this number if you have problems the morning of surgery:  [phone]   Remember:  Do not eat food or drink liquids after midnight.  Take these medicines the morning of surgery with A SIP OF WATER: AmLODipine (NORVASC), Atenolol (TENORMIN), and Gabapentin (NEURONTIN). If needed ALPRAZolam [name] for anxiety, HYDROcodone-acetaminophen (NORCO/VICODIN) for pain, and Promethazine (PHENERGAN) for nausea.  Follow your doctor's instruction regarding Aspirin.  As of today, stop taking all Aspirins, Vitamins, Fish oils, and Herbal medications. Also stop all NSAIDS i.e. Advil, Ibuprofen, Motrin, Aleve, Anaprox, Naproxen, BC and Goody Powders.   Do not wear jewelry, make-up or nail polish.  Do not wear lotions, powders, perfumes, or deodorant.  Do not shave 48 hours prior to surgery.  Do not bring valuables to the hospital.  [facility] is not responsible for any belongings or valuables.  Contacts, dentures or bridgework may not be worn into surgery.  Leave your suitcase in the car.  After surgery it may be brought to your room.  For patients admitted to the hospital, discharge time will be determined by your treatment team.  Patients discharged the day of surgery will not be allowed to drive home.   Special instructions:   - Preparing For Surgery  Before surgery, you can play an important role. Because skin is not sterile, your skin needs to be as free of germs as possible. You can reduce the number of germs  on your skin by washing with CHG (chlorahexidine gluconate) Soap before surgery.  CHG is an antiseptic cleaner which kills germs and bonds with the skin to continue killing germs even after washing.  Please do not use if you have an allergy to CHG or antibacterial soaps. If your skin becomes reddened/irritated stop using the CHG.  Do not shave (including legs and underarms) for at least 48 hours prior to first CHG shower. It is OK to shave your face.  Please follow these instructions carefully.   1. Shower the NIGHT BEFORE SURGERY and the MORNING OF SURGERY with CHG.   2. If you chose to wash your hair, wash your hair first as usual with your normal shampoo.  3. After you shampoo, rinse your hair and body thoroughly to remove the shampoo.  4. Use CHG as you would any other liquid soap. You can apply CHG directly to the skin and wash gently with a scrungie or a clean washcloth.   5. Apply the CHG Soap to your body ONLY FROM THE NECK DOWN.  Do not use on open wounds or open sores. Avoid contact with your eyes, ears, mouth and genitals (private parts). Wash Face and genitals (private parts)  with your normal soap.  6. Wash thoroughly, paying special attention to the area where your surgery will be performed.  7. Thoroughly rinse your body with warm water from the neck down.  8. DO NOT shower/wash with your normal soap after using and rinsing off the CHG Soap.  9. Pat yourself dry with a CLEAN TOWEL.  10. Wear  CLEAN PAJAMAS to bed the night before surgery, wear comfortable clothes the morning of surgery  11. Place CLEAN SHEETS on your bed the night of your first shower and DO NOT SLEEP WITH PETS.  Day of Surgery: Do not apply any deodorants/lotions. Please wear clean clothes to the hospital/surgery center.    Please read over the following fact sheets that you were given. Pain Booklet, Coughing and Deep Breathing, MRSA Information and Surgical Site Infection Prevention

## 2017-06-04 ENCOUNTER — Other Ambulatory Visit: Payer: Self-pay

## 2017-06-04 ENCOUNTER — Ambulatory Visit (INDEPENDENT_AMBULATORY_CARE_PROVIDER_SITE_OTHER): Payer: PPO | Admitting: Orthopaedic Surgery

## 2017-06-04 ENCOUNTER — Encounter (HOSPITAL_COMMUNITY)
Admission: RE | Admit: 2017-06-04 | Discharge: 2017-06-04 | Disposition: A | Discharge status: Home / Self Care | Payer: PPO | Source: Ambulatory Visit | Attending: Orthopaedic Surgery | Admitting: Orthopaedic Surgery

## 2017-06-04 ENCOUNTER — Other Ambulatory Visit: Payer: Self-pay | Admitting: Family Medicine

## 2017-06-04 ENCOUNTER — Encounter (HOSPITAL_COMMUNITY): Payer: Self-pay

## 2017-06-04 DIAGNOSIS — D649 Anemia, unspecified: Secondary | ICD-10-CM | POA: Diagnosis not present

## 2017-06-04 DIAGNOSIS — Z79899 Other long term (current) drug therapy: Secondary | ICD-10-CM | POA: Diagnosis not present

## 2017-06-04 DIAGNOSIS — Z01810 Encounter for preprocedural cardiovascular examination: Secondary | ICD-10-CM | POA: Insufficient documentation

## 2017-06-04 DIAGNOSIS — I10 Essential (primary) hypertension: Secondary | ICD-10-CM | POA: Insufficient documentation

## 2017-06-04 DIAGNOSIS — Z01812 Encounter for preprocedural laboratory examination: Secondary | ICD-10-CM | POA: Insufficient documentation

## 2017-06-04 DIAGNOSIS — Z9889 Other specified postprocedural states: Secondary | ICD-10-CM | POA: Diagnosis not present

## 2017-06-04 DIAGNOSIS — Z7982 Long term (current) use of aspirin: Secondary | ICD-10-CM | POA: Diagnosis not present

## 2017-06-04 DIAGNOSIS — E785 Hyperlipidemia, unspecified: Secondary | ICD-10-CM | POA: Insufficient documentation

## 2017-06-04 HISTORY — DX: Depression, unspecified: F32.A

## 2017-06-04 HISTORY — DX: Atelectasis: J98.11

## 2017-06-04 HISTORY — DX: Major depressive disorder, single episode, unspecified: F32.9

## 2017-06-04 HISTORY — DX: Headache: R51

## 2017-06-04 HISTORY — DX: Headache, unspecified: R51.9

## 2017-06-04 HISTORY — DX: Anemia, unspecified: D64.9

## 2017-06-04 LAB — BASIC METABOLIC PANEL
ANION GAP: 8 (ref 5–15)
BUN: 35 mg/dL — ABNORMAL HIGH (ref 6–20)
CALCIUM: 9.9 mg/dL (ref 8.9–10.3)
CO2: 25 mmol/L (ref 22–32)
CREATININE: 2.11 mg/dL — AB (ref 0.44–1.00)
Chloride: 105 mmol/L (ref 101–111)
GFR, EST AFRICAN AMERICAN: 25 mL/min — AB (ref >60)
GFR, EST NON AFRICAN AMERICAN: 21 mL/min — AB (ref >60)
Glucose, Bld: 102 mg/dL — ABNORMAL HIGH (ref 65–99)
Potassium: 4.5 mmol/L (ref 3.5–5.1)
SODIUM: 138 mmol/L (ref 135–145)

## 2017-06-04 LAB — CBC
HCT: 34.8 % — ABNORMAL LOW (ref 36.0–46.0)
HEMOGLOBIN: 11.1 g/dL — AB (ref 12.0–15.0)
MCH: 28.4 pg (ref 26.0–34.0)
MCHC: 31.9 g/dL (ref 30.0–36.0)
MCV: 89 fL (ref 78.0–100.0)
PLATELETS: 313 10*3/uL (ref 150–400)
RBC: 3.91 MIL/uL (ref 3.87–5.11)
RDW: 13.9 % (ref 11.5–15.5)
WBC: 11.5 10*3/uL — ABNORMAL HIGH (ref 4.0–10.5)

## 2017-06-04 LAB — SURGICAL PCR SCREEN
MRSA, PCR: NEGATIVE
STAPHYLOCOCCUS AUREUS: NEGATIVE

## 2017-06-04 NOTE — Pre-Procedure Instructions (Signed)
Laura Chambers  06/04/2017      [id number] [place] [place] [address] [place] [address] Phone: [phone] Fax: [phone]  [place], [place] - [address] [facility]. [address] [place] [address] Phone: [phone] Fax: [phone]    Your procedure is scheduled on Tuesday, June 09, 2017   Report to [facility] Admitting Entrance "A" at 5:30AM             (posted surgery time 7:30a - 9:45a)   Call this number if you have problems the morning of surgery: [phone]   Remember:   Do not eat food or drink liquids after midnight.   Take these medicines the morning of surgery with A SIP OF WATER: AmLODipine (NORVASC), Atenolol (TENORMIN), and Gabapentin (NEURONTIN). If needed ALPRAZolam [name] for anxiety, HYDROcodone-acetaminophen (NORCO/VICODIN) for pain, and Promethazine (PHENERGAN) for nausea.  Follow your doctor's instruction regarding Aspirin.  As of today, stop taking all Aspirins, Vitamins, Fish oils, and Herbal medications. Also stop all NSAIDS i.e. Advil, Ibuprofen, Motrin, Aleve, Anaprox, Naproxen, BC and Goody Powders.   Do not wear jewelry, make-up or nail polish.  Do not wear lotions, powders, perfumes, or deodorant.  Do not shave 48 hours prior to surgery.  Do not bring valuables to the hospital.  [facility] is not responsible for any belongings or valuables.  Contacts, dentures or bridgework may not be worn into surgery.  Leave your suitcase in the car.  After surgery it may be brought to your room.  For patients admitted to the hospital, discharge time will be determined by your treatment team.  Patients discharged the day of surgery will not be allowed to drive home.   Special instructions:   [place]- Preparing For Surgery  Before surgery, you can play an important role. Because skin is not sterile, your skin needs to be as free  of germs as possible. You can reduce the number of germs on your skin by washing with CHG (chlorahexidine gluconate) Soap before surgery.  CHG is an antiseptic cleaner which kills germs and bonds with the skin to continue killing germs even after washing.  Please do not use if you have an allergy to CHG or antibacterial soaps. If your skin becomes reddened/irritated stop using the CHG.  Do not shave (including legs and underarms) for at least 48 hours prior to first CHG shower. It is OK to shave your face.  Please follow these instructions carefully.   1. Shower the NIGHT BEFORE SURGERY and the MORNING OF SURGERY with CHG.   2. If you chose to wash your hair, wash your hair first as usual with your normal shampoo.  3. After you shampoo, rinse your hair and body thoroughly to remove the shampoo.  4. Use CHG as you would any other liquid soap. You can apply CHG directly to the skin and wash gently with a scrungie or a clean washcloth.   5. Apply the CHG Soap to your body ONLY FROM THE NECK DOWN.  Do not use on open wounds or open sores. Avoid contact with your eyes, ears, mouth and genitals (private parts). Wash Face and genitals (private parts)  with your normal soap.  6. Wash thoroughly, paying special attention to the area where your surgery will be performed.  7. Thoroughly rinse your body with warm water from the neck down.  8. DO NOT shower/wash with your normal  soap after using and rinsing off the CHG Soap.  9. Pat yourself dry with a CLEAN TOWEL.  10. Wear CLEAN PAJAMAS to bed the night before surgery, wear comfortable clothes the morning of surgery  11. Place CLEAN SHEETS on your bed the night of your first shower and DO NOT SLEEP WITH PETS.  Day of Surgery: Do not apply any deodorants/lotions. Please wear clean clothes to the hospital/surgery center.    Please read over the following fact sheets that you were given. Pain Booklet, Coughing and Deep Breathing, MRSA Information  and Surgical Site Infection Prevention

## 2017-06-04 NOTE — Progress Notes (Signed)
PCP is Dr. Briscoe Deutscher  LOV 03/2017                                                                         Originally had knee surgery back in 2001 by Dr. Gladstone Lighter.  Approximately in April - May, had to go see Dr. Ninfa Linden (had seen PA in office), knee was drained, she got worse, and was sent to hospital with sepsis of knee. She has been in nursing home x 3 mths.   Placed on Vanco, Rocephin  & Zosyn - - which led to AKI  Has been seeing Dr. Elvera Lennox for kidneys. Cardio was Dr. Mare Ferrari, now currently will be Dr. 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

## 2017-06-04 NOTE — Telephone Encounter (Signed)
Copied from [place]. Topic: Quick Communication - See Telephone Encounter >> Jun 04, 2017  4:42 PM Percell Belt A wrote: CRM for notification. See Telephone encounter for: pt need refill on bp meds and needs it asap.  Pharmacy faxed it in today.  She is having surgery Monday and needs it over the weekend.  Attached rx  06/04/17.

## 2017-06-05 NOTE — Progress Notes (Addendum)
Anesthesia Chart Review:  Pt is a 76 year old female scheduled for L revision knee arthroplasty on 06/09/2017 with Jean Rosenthal, MD  - PCP is Briscoe Deutscher, DO - Nephrologist is Fran Lowes, MD. Last office visit 01/13/17 - Cardiologist is Kirk Ruths, MD who follows pt for HTN, hyperlipidemia. Last office visit 04/01/16; 1 year f/u recommended.   PMH includes:  HTN, hyperlipidemia, aenmia. Never smoker. BMI 32.5. S/p excisional L TKA, placement of antibiotic spacers 11/14/16. 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   - Hospitalized 6/11-28/18   for acute kidney injury (Cr went from 0.97 to 5.65). 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 smaller tube used with other surgeries  Medications include: Amlodipine, ASA 81 mg, atenolol, Lasix, rosuvastatin  BP (!) 139/52   Pulse 66   Temp 36.4 C   Resp 20   Ht 5\' 6"  (1.676 m)   Wt 202 lb (91.6 kg)   SpO2 100%   BMI 32.60 kg/m   Preoperative labs reviewed.   - Cr 2.11, BUN 35. This is consistent with recent prior results.   1 view CXR 12/30/16:  - Left PICC line slightly retracted now with tip overlying the medial left subclavian vein. - Slightly decreased pulmonary vascular congestion and improved bibasilar aeration.  EKG 06/04/17: NSR  Echo 12/27/16:  - Left ventricle: The cavity size was normal. Wall thickness was increased in a pattern of mild LVH. Systolic function was normal. The estimated ejection fraction was in the range of 60% to 65%. Wall motion was normal; there were no regional wall motion abnormalities. The study is not technically sufficient to allow evaluation of LV diastolic function. Doppler parameters are consistent with high ventricular filling pressure. - Aortic valve: Transvalvular velocity was  within the normal range. There was no stenosis. There was no regurgitation. - Mitral valve: Mildly calcified annulus. Transvalvular velocity was within the normal range. There was no evidence for stenosis. There was trivial regurgitation. - Right ventricle: The cavity size was normal. Wall thickness was normal. Systolic function was normal. - Tricuspid valve: There was trivial regurgitation. - Pulmonary arteries: Systolic pressure was mildly increased. PA peak pressure: 44 mm Hg (S).  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 - No hydronephrosis.  CT angio chest 11/18/16:  1. No pulmonary embolism identified. 2. Mild cardiomegaly. Scattered ground-glass opacities bilaterally, most likely mild edema related to mild CHF/volume overload. 3. Small bilateral pleural effusions with adjacent atelectasis. 4. Aortic atherosclerosis.  If no changes, I anticipate pt can proceed with surgery as scheduled.   Willeen Cass, FNP-BC [facility] Short Stay Surgical Center/Anesthesiology Phone: [phone] 06/08/2017 3:53 PM

## 2017-06-08 ENCOUNTER — Encounter (HOSPITAL_COMMUNITY): Payer: Self-pay

## 2017-06-08 MED ORDER — TRANEXAMIC ACID 1000 MG/10ML IV SOLN
1000.0000 mg | INTRAVENOUS | Status: DC
  Filled 2017-06-08: qty 10

## 2017-06-09 ENCOUNTER — Inpatient Hospital Stay (HOSPITAL_COMMUNITY): Payer: PPO | Admitting: Certified Registered Nurse Anesthetist

## 2017-06-09 ENCOUNTER — Encounter (HOSPITAL_COMMUNITY)
Admission: RE | Disposition: A | Discharge status: Home Health | Payer: Self-pay | Source: Ambulatory Visit | Attending: Orthopaedic Surgery

## 2017-06-09 ENCOUNTER — Inpatient Hospital Stay (HOSPITAL_COMMUNITY)
Admission: RE | Admit: 2017-06-09 | Discharge: 2017-06-12 | DRG: 467 | Disposition: A | Discharge status: Home Health | Payer: PPO | Source: Ambulatory Visit | Attending: Orthopaedic Surgery | Admitting: Orthopaedic Surgery

## 2017-06-09 ENCOUNTER — Inpatient Hospital Stay (HOSPITAL_COMMUNITY): Payer: PPO

## 2017-06-09 ENCOUNTER — Inpatient Hospital Stay (HOSPITAL_COMMUNITY): Payer: PPO | Admitting: Vascular Surgery

## 2017-06-09 ENCOUNTER — Encounter (HOSPITAL_COMMUNITY): Payer: Self-pay

## 2017-06-09 ENCOUNTER — Other Ambulatory Visit (INDEPENDENT_AMBULATORY_CARE_PROVIDER_SITE_OTHER): Payer: Self-pay

## 2017-06-09 DIAGNOSIS — Z800 Family history of malignant neoplasm of digestive organs: Secondary | ICD-10-CM

## 2017-06-09 DIAGNOSIS — E785 Hyperlipidemia, unspecified: Secondary | ICD-10-CM | POA: Diagnosis present

## 2017-06-09 DIAGNOSIS — R739 Hyperglycemia, unspecified: Secondary | ICD-10-CM | POA: Diagnosis not present

## 2017-06-09 DIAGNOSIS — M810 Age-related osteoporosis without current pathological fracture: Secondary | ICD-10-CM | POA: Diagnosis present

## 2017-06-09 DIAGNOSIS — M797 Fibromyalgia: Secondary | ICD-10-CM | POA: Diagnosis not present

## 2017-06-09 DIAGNOSIS — Z452 Encounter for adjustment and management of vascular access device: Secondary | ICD-10-CM

## 2017-06-09 DIAGNOSIS — Z9049 Acquired absence of other specified parts of digestive tract: Secondary | ICD-10-CM

## 2017-06-09 DIAGNOSIS — R194 Change in bowel habit: Secondary | ICD-10-CM | POA: Diagnosis not present

## 2017-06-09 DIAGNOSIS — E7800 Pure hypercholesterolemia, unspecified: Secondary | ICD-10-CM | POA: Diagnosis not present

## 2017-06-09 DIAGNOSIS — Y831 Surgical operation with implant of artificial internal device as the cause of abnormal reaction of the patient, or of later complication, without mention of misadventure at the time of the procedure: Secondary | ICD-10-CM | POA: Diagnosis present

## 2017-06-09 DIAGNOSIS — Z823 Family history of stroke: Secondary | ICD-10-CM | POA: Diagnosis not present

## 2017-06-09 DIAGNOSIS — F329 Major depressive disorder, single episode, unspecified: Secondary | ICD-10-CM | POA: Diagnosis not present

## 2017-06-09 DIAGNOSIS — Z96652 Presence of left artificial knee joint: Secondary | ICD-10-CM | POA: Diagnosis not present

## 2017-06-09 DIAGNOSIS — I1310 Hypertensive heart and chronic kidney disease without heart failure, with stage 1 through stage 4 chronic kidney disease, or unspecified chronic kidney disease: Secondary | ICD-10-CM | POA: Diagnosis present

## 2017-06-09 DIAGNOSIS — D62 Acute posthemorrhagic anemia: Secondary | ICD-10-CM | POA: Diagnosis not present

## 2017-06-09 DIAGNOSIS — T8454XA Infection and inflammatory reaction due to internal left knee prosthesis, initial encounter: Principal | ICD-10-CM | POA: Diagnosis present

## 2017-06-09 DIAGNOSIS — M25462 Effusion, left knee: Secondary | ICD-10-CM | POA: Diagnosis present

## 2017-06-09 DIAGNOSIS — N189 Chronic kidney disease, unspecified: Secondary | ICD-10-CM | POA: Diagnosis present

## 2017-06-09 DIAGNOSIS — T8453XA Infection and inflammatory reaction due to internal right knee prosthesis, initial encounter: Secondary | ICD-10-CM | POA: Diagnosis not present

## 2017-06-09 DIAGNOSIS — Z6832 Body mass index (BMI) 32.0-32.9, adult: Secondary | ICD-10-CM | POA: Diagnosis not present

## 2017-06-09 DIAGNOSIS — Z833 Family history of diabetes mellitus: Secondary | ICD-10-CM | POA: Diagnosis not present

## 2017-06-09 DIAGNOSIS — F419 Anxiety disorder, unspecified: Secondary | ICD-10-CM | POA: Diagnosis not present

## 2017-06-09 DIAGNOSIS — Z86010 Personal history of colonic polyps: Secondary | ICD-10-CM

## 2017-06-09 DIAGNOSIS — Z471 Aftercare following joint replacement surgery: Secondary | ICD-10-CM | POA: Diagnosis not present

## 2017-06-09 DIAGNOSIS — E669 Obesity, unspecified: Secondary | ICD-10-CM | POA: Diagnosis not present

## 2017-06-09 DIAGNOSIS — Z8249 Family history of ischemic heart disease and other diseases of the circulatory system: Secondary | ICD-10-CM

## 2017-06-09 DIAGNOSIS — Z90710 Acquired absence of both cervix and uterus: Secondary | ICD-10-CM

## 2017-06-09 HISTORY — PX: REVISION TOTAL KNEE ARTHROPLASTY: SUR1280

## 2017-06-09 HISTORY — PX: TOTAL KNEE REVISION: SHX996

## 2017-06-09 LAB — PROTIME-INR
INR: 1.03
Prothrombin Time: 13.4 seconds (ref 11.4–15.2)

## 2017-06-09 SURGERY — TOTAL KNEE REVISION
Anesthesia: General | Site: Knee | Laterality: Left

## 2017-06-09 MED ORDER — DEXAMETHASONE SODIUM PHOSPHATE 10 MG/ML IJ SOLN
INTRAMUSCULAR | Status: DC | PRN
  Administered 2017-06-09: 10 mg via INTRAVENOUS

## 2017-06-09 MED ORDER — METHOCARBAMOL 1000 MG/10ML IJ SOLN
500.0000 mg | Freq: Four times a day (QID) | INTRAVENOUS | Status: DC | PRN
  Filled 2017-06-09: qty 5

## 2017-06-09 MED ORDER — CEFAZOLIN SODIUM-DEXTROSE 2-4 GM/100ML-% IV SOLN
INTRAVENOUS | Status: AC
  Administered 2017-06-09: 2000 mg
  Filled 2017-06-09: qty 100

## 2017-06-09 MED ORDER — METOCLOPRAMIDE HCL 5 MG/ML IJ SOLN: 5.0000 mg | Freq: Three times a day (TID) | INTRAMUSCULAR | Status: DC | PRN

## 2017-06-09 MED ORDER — HYDROMORPHONE HCL 1 MG/ML IJ SOLN
INTRAMUSCULAR | Status: AC
  Administered 2017-06-09: 0.5 mg via INTRAVENOUS
  Filled 2017-06-09: qty 1

## 2017-06-09 MED ORDER — SODIUM CHLORIDE 0.9 % IR SOLN
Status: DC | PRN
  Administered 2017-06-09: 3000 mL

## 2017-06-09 MED ORDER — ROSUVASTATIN CALCIUM 10 MG PO TABS
10.0000 mg | ORAL_TABLET | Freq: Every day | ORAL | Status: DC
  Administered 2017-06-10 – 2017-06-12 (×3): 10 mg via ORAL
  Filled 2017-06-09 (×3): qty 1

## 2017-06-09 MED ORDER — OXYCODONE HCL 5 MG PO TABS
ORAL_TABLET | ORAL | Status: AC
  Administered 2017-06-09: 10 mg via ORAL
  Filled 2017-06-09: qty 2

## 2017-06-09 MED ORDER — ONDANSETRON HCL 4 MG PO TABS
4.0000 mg | ORAL_TABLET | Freq: Four times a day (QID) | ORAL | Status: DC | PRN
  Administered 2017-06-09: 4 mg via ORAL
  Filled 2017-06-09: qty 1

## 2017-06-09 MED ORDER — TRANEXAMIC ACID 1000 MG/10ML IV SOLN
INTRAVENOUS | Status: DC | PRN
  Administered 2017-06-09: 1000 mg via INTRAVENOUS

## 2017-06-09 MED ORDER — WARFARIN SODIUM 5 MG PO TABS
5.0000 mg | ORAL_TABLET | Freq: Once | ORAL | Status: AC
  Administered 2017-06-09: 5 mg via ORAL
  Filled 2017-06-09: qty 1

## 2017-06-09 MED ORDER — GABAPENTIN 300 MG PO CAPS
300.0000 mg | ORAL_CAPSULE | Freq: Two times a day (BID) | ORAL | Status: DC
  Administered 2017-06-09 – 2017-06-12 (×6): 300 mg via ORAL
  Filled 2017-06-09 (×6): qty 1

## 2017-06-09 MED ORDER — PROMETHAZINE HCL 25 MG PO TABS: 25.0000 mg | ORAL_TABLET | Freq: Three times a day (TID) | ORAL | Status: DC | PRN

## 2017-06-09 MED ORDER — FOLIC ACID 1 MG PO TABS
1.0000 mg | ORAL_TABLET | Freq: Every day | ORAL | Status: DC
  Administered 2017-06-10 – 2017-06-12 (×3): 1 mg via ORAL
  Filled 2017-06-09 (×3): qty 1

## 2017-06-09 MED ORDER — HYDROMORPHONE HCL 1 MG/ML IJ SOLN
0.2500 mg | INTRAMUSCULAR | Status: DC | PRN
  Administered 2017-06-09 (×4): 0.5 mg via INTRAVENOUS

## 2017-06-09 MED ORDER — CEFAZOLIN SODIUM-DEXTROSE 2-4 GM/100ML-% IV SOLN
INTRAVENOUS | Status: AC
  Filled 2017-06-09: qty 100

## 2017-06-09 MED ORDER — HYDROCODONE-ACETAMINOPHEN 5-325 MG PO TABS
1.0000 | ORAL_TABLET | ORAL | Status: DC | PRN
  Administered 2017-06-09 – 2017-06-12 (×9): 2 via ORAL
  Filled 2017-06-09 (×10): qty 2

## 2017-06-09 MED ORDER — EPHEDRINE SULFATE-NACL 50-0.9 MG/10ML-% IV SOSY
PREFILLED_SYRINGE | INTRAVENOUS | Status: DC | PRN
  Administered 2017-06-09 (×4): 10 mg via INTRAVENOUS

## 2017-06-09 MED ORDER — ONDANSETRON HCL 4 MG/2ML IJ SOLN
INTRAMUSCULAR | Status: DC | PRN
  Administered 2017-06-09: 4 mg via INTRAVENOUS

## 2017-06-09 MED ORDER — PHENOL 1.4 % MT LIQD: 1.0000 | OROMUCOSAL | Status: DC | PRN

## 2017-06-09 MED ORDER — ROCURONIUM BROMIDE 10 MG/ML (PF) SYRINGE
PREFILLED_SYRINGE | INTRAVENOUS | Status: AC
  Filled 2017-06-09: qty 5

## 2017-06-09 MED ORDER — SODIUM CHLORIDE 0.9% FLUSH
10.0000 mL | INTRAVENOUS | Status: DC | PRN
  Administered 2017-06-11: 20 mL
  Filled 2017-06-09: qty 40

## 2017-06-09 MED ORDER — TAMSULOSIN HCL 0.4 MG PO CAPS
0.4000 mg | ORAL_CAPSULE | Freq: Every evening | ORAL | Status: DC
  Administered 2017-06-09 – 2017-06-11 (×3): 0.4 mg via ORAL
  Filled 2017-06-09 (×3): qty 1

## 2017-06-09 MED ORDER — MENTHOL 3 MG MT LOZG: 1.0000 | LOZENGE | OROMUCOSAL | Status: DC | PRN

## 2017-06-09 MED ORDER — SUGAMMADEX SODIUM 200 MG/2ML IV SOLN
INTRAVENOUS | Status: DC | PRN
  Administered 2017-06-09: 200 mg via INTRAVENOUS

## 2017-06-09 MED ORDER — LIDOCAINE 2% (20 MG/ML) 5 ML SYRINGE
INTRAMUSCULAR | Status: AC
  Filled 2017-06-09: qty 5

## 2017-06-09 MED ORDER — LIDOCAINE 2% (20 MG/ML) 5 ML SYRINGE
INTRAMUSCULAR | Status: DC | PRN
  Administered 2017-06-09: 80 mg via INTRAVENOUS

## 2017-06-09 MED ORDER — CHLORHEXIDINE GLUCONATE 4 % EX LIQD: 60.0000 mL | Freq: Once | CUTANEOUS | Status: DC

## 2017-06-09 MED ORDER — FUROSEMIDE 40 MG PO TABS: 40.0000 mg | ORAL_TABLET | Freq: Every day | ORAL | Status: DC | PRN

## 2017-06-09 MED ORDER — ROCURONIUM BROMIDE 50 MG/5ML IV SOSY
PREFILLED_SYRINGE | INTRAVENOUS | Status: DC | PRN
  Administered 2017-06-09: 50 mg via INTRAVENOUS

## 2017-06-09 MED ORDER — AMLODIPINE BESYLATE 10 MG PO TABS
10.0000 mg | ORAL_TABLET | Freq: Every day | ORAL | Status: DC
  Administered 2017-06-11 – 2017-06-12 (×2): 10 mg via ORAL
  Filled 2017-06-09 (×3): qty 1

## 2017-06-09 MED ORDER — FENTANYL CITRATE (PF) 100 MCG/2ML IJ SOLN
INTRAMUSCULAR | Status: DC | PRN
  Administered 2017-06-09: 25 ug via INTRAVENOUS
  Administered 2017-06-09 (×6): 50 ug via INTRAVENOUS

## 2017-06-09 MED ORDER — CEFAZOLIN SODIUM-DEXTROSE 2-4 GM/100ML-% IV SOLN
2.0000 g | INTRAVENOUS | Status: AC
  Administered 2017-06-09: 2 g via INTRAVENOUS

## 2017-06-09 MED ORDER — ACETAMINOPHEN 325 MG PO TABS: 650.0000 mg | ORAL_TABLET | ORAL | Status: DC | PRN

## 2017-06-09 MED ORDER — ATENOLOL 50 MG PO TABS
50.0000 mg | ORAL_TABLET | Freq: Every day | ORAL | Status: DC
  Administered 2017-06-10 – 2017-06-12 (×3): 50 mg via ORAL
  Filled 2017-06-09 (×3): qty 1

## 2017-06-09 MED ORDER — HYDROMORPHONE HCL 1 MG/ML IJ SOLN
1.0000 mg | INTRAMUSCULAR | Status: DC | PRN
  Administered 2017-06-12: 1 mg via INTRAVENOUS
  Filled 2017-06-09: qty 1

## 2017-06-09 MED ORDER — WARFARIN - PHARMACIST DOSING INPATIENT: Freq: Every day | Status: DC

## 2017-06-09 MED ORDER — ALPRAZOLAM 0.25 MG PO TABS: 0.2500 mg | ORAL_TABLET | Freq: Every day | ORAL | Status: DC | PRN

## 2017-06-09 MED ORDER — PROPOFOL 10 MG/ML IV BOLUS
INTRAVENOUS | Status: DC | PRN
  Administered 2017-06-09: 150 mg via INTRAVENOUS

## 2017-06-09 MED ORDER — MIDAZOLAM HCL 2 MG/2ML IJ SOLN
INTRAMUSCULAR | Status: DC | PRN
  Administered 2017-06-09: 1 mg via INTRAVENOUS

## 2017-06-09 MED ORDER — DIPHENHYDRAMINE HCL 12.5 MG/5ML PO ELIX: 12.5000 mg | ORAL_SOLUTION | ORAL | Status: DC | PRN

## 2017-06-09 MED ORDER — FENTANYL CITRATE (PF) 250 MCG/5ML IJ SOLN
INTRAMUSCULAR | Status: AC
  Filled 2017-06-09: qty 5

## 2017-06-09 MED ORDER — METHOCARBAMOL 500 MG PO TABS
ORAL_TABLET | ORAL | Status: AC
  Administered 2017-06-09: 500 mg via ORAL
  Filled 2017-06-09: qty 1

## 2017-06-09 MED ORDER — PROPOFOL 10 MG/ML IV BOLUS
INTRAVENOUS | Status: AC
  Filled 2017-06-09: qty 20

## 2017-06-09 MED ORDER — METOCLOPRAMIDE HCL 5 MG PO TABS: 5.0000 mg | ORAL_TABLET | Freq: Three times a day (TID) | ORAL | Status: DC | PRN

## 2017-06-09 MED ORDER — 0.9 % SODIUM CHLORIDE (POUR BTL) OPTIME
TOPICAL | Status: DC | PRN
  Administered 2017-06-09: 1000 mL

## 2017-06-09 MED ORDER — ONDANSETRON HCL 4 MG/2ML IJ SOLN: 4.0000 mg | Freq: Four times a day (QID) | INTRAMUSCULAR | Status: DC | PRN

## 2017-06-09 MED ORDER — OXYCODONE HCL 5 MG/5ML PO SOLN: 5.0000 mg | Freq: Once | ORAL | Status: DC | PRN

## 2017-06-09 MED ORDER — LACTATED RINGERS IV SOLN
INTRAVENOUS | Status: DC | PRN
  Administered 2017-06-09 (×2): via INTRAVENOUS

## 2017-06-09 MED ORDER — SODIUM CHLORIDE 0.9 % IV SOLN
INTRAVENOUS | Status: DC | PRN
  Administered 2017-06-09: 09:00:00 via INTRAVENOUS

## 2017-06-09 MED ORDER — MIDAZOLAM HCL 2 MG/2ML IJ SOLN
INTRAMUSCULAR | Status: AC
  Filled 2017-06-09: qty 2

## 2017-06-09 MED ORDER — ALUM & MAG HYDROXIDE-SIMETH 200-200-20 MG/5ML PO SUSP: 30.0000 mL | ORAL | Status: DC | PRN

## 2017-06-09 MED ORDER — CEFAZOLIN SODIUM-DEXTROSE 1-4 GM/50ML-% IV SOLN
1.0000 g | Freq: Four times a day (QID) | INTRAVENOUS | Status: AC
  Administered 2017-06-09 – 2017-06-10 (×2): 1 g via INTRAVENOUS
  Filled 2017-06-09 (×2): qty 50

## 2017-06-09 MED ORDER — ONDANSETRON HCL 4 MG/2ML IJ SOLN
INTRAMUSCULAR | Status: AC
  Filled 2017-06-09: qty 2

## 2017-06-09 MED ORDER — DOCUSATE SODIUM 100 MG PO CAPS
100.0000 mg | ORAL_CAPSULE | Freq: Two times a day (BID) | ORAL | Status: DC
  Administered 2017-06-09 – 2017-06-12 (×6): 100 mg via ORAL
  Filled 2017-06-09 (×6): qty 1

## 2017-06-09 MED ORDER — SODIUM CHLORIDE 0.9 % IR SOLN
Status: DC | PRN
  Administered 2017-06-09: 1000 mL

## 2017-06-09 MED ORDER — ACETAMINOPHEN 650 MG RE SUPP: 650.0000 mg | RECTAL | Status: DC | PRN

## 2017-06-09 MED ORDER — OXYCODONE HCL 5 MG PO TABS
10.0000 mg | ORAL_TABLET | ORAL | Status: DC | PRN
  Administered 2017-06-09 (×2): 10 mg via ORAL

## 2017-06-09 MED ORDER — EPHEDRINE 5 MG/ML INJ
INTRAVENOUS | Status: AC
  Filled 2017-06-09: qty 10

## 2017-06-09 MED ORDER — OXYCODONE HCL 5 MG PO TABS: 5.0000 mg | ORAL_TABLET | Freq: Once | ORAL | Status: DC | PRN

## 2017-06-09 MED ORDER — METHOCARBAMOL 500 MG PO TABS
500.0000 mg | ORAL_TABLET | Freq: Four times a day (QID) | ORAL | Status: DC | PRN
  Administered 2017-06-09 – 2017-06-12 (×4): 500 mg via ORAL
  Filled 2017-06-09 (×3): qty 1

## 2017-06-09 MED ORDER — DEXAMETHASONE SODIUM PHOSPHATE 10 MG/ML IJ SOLN
INTRAMUSCULAR | Status: AC
  Filled 2017-06-09: qty 1

## 2017-06-09 MED ORDER — ADULT MULTIVITAMIN W/MINERALS CH
1.0000 | ORAL_TABLET | ORAL | Status: DC
  Administered 2017-06-10 – 2017-06-12 (×2): 1 via ORAL
  Filled 2017-06-09 (×3): qty 1

## 2017-06-09 MED ORDER — CALCIUM CARBONATE-VITAMIN D 500-200 MG-UNIT PO TABS
1.0000 | ORAL_TABLET | ORAL | Status: DC
  Administered 2017-06-10 – 2017-06-12 (×2): 1 via ORAL
  Filled 2017-06-09 (×3): qty 1

## 2017-06-09 MED ORDER — SUGAMMADEX SODIUM 200 MG/2ML IV SOLN
INTRAVENOUS | Status: AC
  Filled 2017-06-09: qty 2

## 2017-06-09 SURGICAL SUPPLY — 91 items
ADAPTER BOLT FEMORAL +2/-2 (Knees) ×2 IMPLANT
ADPR FEM +2/-2 OFST BOLT (Knees) ×1 IMPLANT
ADPR FEM 5D STRL KN PFC SGM (Orthopedic Implant) ×1 IMPLANT
AUG FEM SZ3 4 CMB POST STRL LF (Knees) ×2 IMPLANT
AUG FEM SZ3 4 STRL LF KN LT TI (Knees) ×2 IMPLANT
AUG TIB SZ3 10 REV STP WDG (Knees) ×2 IMPLANT
BANDAGE ACE 6X5 VEL STRL LF (GAUZE/BANDAGES/DRESSINGS) ×3 IMPLANT
BANDAGE ELASTIC 6 VELCRO ST LF (GAUZE/BANDAGES/DRESSINGS) ×2 IMPLANT
BANDAGE ESMARK 6X9 LF (GAUZE/BANDAGES/DRESSINGS) ×1 IMPLANT
BLADE CLIPPER SURG (BLADE) IMPLANT
BLADE SAG 18X100X1.27 (BLADE) ×3 IMPLANT
BLADE SAGITTAL 25.0X1.27X90 (BLADE) ×2 IMPLANT
BLADE SAGITTAL 25.0X1.27X90MM (BLADE) ×1
BLADE SAW SGTL 13.0X1.19X90.0M (BLADE) ×2 IMPLANT
BLADE SURG 10 STRL SS (BLADE) ×6 IMPLANT
BNDG CMPR 9X6 STRL LF SNTH (GAUZE/BANDAGES/DRESSINGS) ×1
BNDG COHESIVE 6X5 TAN STRL LF (GAUZE/BANDAGES/DRESSINGS) ×6 IMPLANT
BNDG ESMARK 6X9 LF (GAUZE/BANDAGES/DRESSINGS) ×3
BONE CEMENT PALACOSE (Cement) ×9 IMPLANT
BOWL SMART MIX CTS (DISPOSABLE) IMPLANT
CEMENT BONE PALACOSE (Cement) IMPLANT
COVER SURGICAL LIGHT HANDLE (MISCELLANEOUS) ×3 IMPLANT
CUFF TOURNIQUET SINGLE 34IN LL (TOURNIQUET CUFF) ×2 IMPLANT
CUFF TOURNIQUET SINGLE 44IN (TOURNIQUET CUFF) IMPLANT
DISTAL WEDGE PFC 4MM LEFT (Knees) ×6 IMPLANT
DRAPE IMP U-DRAPE 54X76 (DRAPES) ×5 IMPLANT
DRAPE ORTHO SPLIT 77X108 STRL (DRAPES) ×6
DRAPE SURG ORHT 6 SPLT 77X108 (DRAPES) ×2 IMPLANT
DRAPE U-SHAPE 47X51 STRL (DRAPES) ×3 IMPLANT
DRSG PAD ABDOMINAL 8X10 ST (GAUZE/BANDAGES/DRESSINGS) ×6 IMPLANT
DURAPREP 26ML APPLICATOR (WOUND CARE) ×3 IMPLANT
ELECT CAUTERY BLADE 6.4 (BLADE) ×2 IMPLANT
ELECT REM PT RETURN 9FT ADLT (ELECTROSURGICAL) ×3
ELECTRODE REM PT RTRN 9FT ADLT (ELECTROSURGICAL) ×1 IMPLANT
EVACUATOR 1/8 PVC DRAIN (DRAIN) IMPLANT
FACESHIELD STD STERILE (MASK) ×8 IMPLANT
FEM TC3 PFC SZ3 LEFT (Orthopedic Implant) ×3 IMPLANT
FEMORAL ADAPTER (Orthopedic Implant) ×2 IMPLANT
FEMORAL TC3 PFC SZ3 LEFT (Orthopedic Implant) IMPLANT
GAUZE SPONGE 4X4 12PLY STRL (GAUZE/BANDAGES/DRESSINGS) ×3 IMPLANT
GAUZE XEROFORM 1X8 LF (GAUZE/BANDAGES/DRESSINGS) ×3 IMPLANT
GLOVE BIO SURGEON STRL SZ8 (GLOVE) ×3 IMPLANT
GLOVE BIOGEL PI IND STRL 8 (GLOVE) ×2 IMPLANT
GLOVE BIOGEL PI INDICATOR 8 (GLOVE) ×4
GLOVE ORTHO TXT STRL SZ7.5 (GLOVE) ×3 IMPLANT
GOWN STRL REUS W/ TWL LRG LVL3 (GOWN DISPOSABLE) ×3 IMPLANT
GOWN STRL REUS W/ TWL XL LVL3 (GOWN DISPOSABLE) ×2 IMPLANT
GOWN STRL REUS W/TWL LRG LVL3 (GOWN DISPOSABLE) ×9
GOWN STRL REUS W/TWL XL LVL3 (GOWN DISPOSABLE) ×6
HANDPIECE INTERPULSE COAX TIP (DISPOSABLE) ×3
IMMOBILIZER KNEE 22 (SOFTGOODS) ×2 IMPLANT
IMMOBILIZER KNEE 22 UNIV (SOFTGOODS) ×3 IMPLANT
INSERT TC3 RP TIBIAL SZ 3.0 (Knees) ×2 IMPLANT
KIT BASIN OR (CUSTOM PROCEDURE TRAY) ×3 IMPLANT
KIT ROOM TURNOVER OR (KITS) ×3 IMPLANT
MANIFOLD NEPTUNE II (INSTRUMENTS) ×3 IMPLANT
NS IRRIG 1000ML POUR BTL (IV SOLUTION) ×3 IMPLANT
PACK TOTAL JOINT (CUSTOM PROCEDURE TRAY) ×3 IMPLANT
PACK UNIVERSAL I (CUSTOM PROCEDURE TRAY) ×3 IMPLANT
PAD ABD 8X10 STRL (GAUZE/BANDAGES/DRESSINGS) ×4 IMPLANT
PAD ARMBOARD 7.5X6 YLW CONV (MISCELLANEOUS) ×6 IMPLANT
PADDING CAST COTTON 6X4 STRL (CAST SUPPLIES) ×3 IMPLANT
PATELLA DOME PFC 32MM (Knees) ×2 IMPLANT
POST AVE PFC 4MM (Knees) ×4 IMPLANT
RASP HELIOCORDIAL MED (MISCELLANEOUS) IMPLANT
SET HNDPC FAN SPRY TIP SCT (DISPOSABLE) IMPLANT
SET PAD KNEE POSITIONER (MISCELLANEOUS) ×3 IMPLANT
SPONGE LAP 18X18 X RAY DECT (DISPOSABLE) ×2 IMPLANT
STAPLER VISISTAT 35W (STAPLE) ×3 IMPLANT
STEM TAPER CEM SIGMA (Stem) ×2 IMPLANT
STEM TIBIA PFC 13X60MM (Stem) ×2 IMPLANT
SUCTION FRAZIER HANDLE 10FR (MISCELLANEOUS) ×2
SUCTION TUBE FRAZIER 10FR DISP (MISCELLANEOUS) ×1 IMPLANT
SUT VIC AB 0 CT1 27 (SUTURE) ×6
SUT VIC AB 0 CT1 27XBRD ANBCTR (SUTURE) ×2 IMPLANT
SUT VIC AB 1 CT1 27 (SUTURE) ×6
SUT VIC AB 1 CT1 27XBRD ANBCTR (SUTURE) ×2 IMPLANT
SUT VIC AB 2-0 CT1 27 (SUTURE) ×6
SUT VIC AB 2-0 CT1 TAPERPNT 27 (SUTURE) ×2 IMPLANT
SWAB COLLECTION DEVICE MRSA (MISCELLANEOUS) ×2 IMPLANT
SWAB CULTURE ESWAB REG 1ML (MISCELLANEOUS) ×3 IMPLANT
TOWEL OR 17X24 6PK STRL BLUE (TOWEL DISPOSABLE) ×3 IMPLANT
TOWEL OR 17X26 10 PK STRL BLUE (TOWEL DISPOSABLE) ×3 IMPLANT
TRAY FOLEY W/METER SILVER 16FR (SET/KITS/TRAYS/PACK) ×2 IMPLANT
TRAY REVISION SZ 3 (Knees) ×2 IMPLANT
TRAY SLEEVE CEM ML (Knees) ×2 IMPLANT
WATER STERILE IRR 1000ML POUR (IV SOLUTION) ×3 IMPLANT
WEDGE DISTAL PFC LEFT 4MM (Knees) IMPLANT
WEDGE SZ 3 10MM (Knees) ×4 IMPLANT
WRAP KNEE MAXI GEL POST OP (GAUZE/BANDAGES/DRESSINGS) ×3 IMPLANT
YANKAUER SUCT BULB TIP NO VENT (SUCTIONS) ×2 IMPLANT

## 2017-06-09 NOTE — Anesthesia Preprocedure Evaluation (Addendum)
Anesthesia Evaluation  Patient identified by MRN, date of birth, ID band Patient awake    History of Anesthesia Complications (+) history of anesthetic complications  Airway Mallampati: I  TM Distance: >3 FB Neck ROM: Full    Dental  (+) Teeth Intact   Pulmonary neg pulmonary ROS,    breath sounds clear to auscultation       Cardiovascular hypertension,  Rhythm:Regular Rate:Normal     Neuro/Psych  Neuromuscular disease    GI/Hepatic hiatal hernia,   Endo/Other    Renal/GU Renal disease     Musculoskeletal  (+) Arthritis , Fibromyalgia -  Abdominal (+) + obese,   Peds  Hematology  (+) anemia ,   Anesthesia Other Findings   Reproductive/Obstetrics                            Anesthesia Physical Anesthesia Plan  ASA: III  Anesthesia Plan: General   Post-op Pain Management:  Regional for Post-op pain   Induction: Intravenous  PONV Risk Score and Plan: 4 or greater and Dexamethasone, Ondansetron, Treatment may vary due to age or medical condition and Midazolam  Airway Management Planned: Oral ETT  Additional Equipment:   Intra-op Plan:   Post-operative Plan: Extubation in OR  Informed Consent: I have reviewed the patients History and Physical, chart, labs and discussed the procedure including the risks, benefits and alternatives for the proposed anesthesia with the patient or authorized representative who has indicated his/her understanding and acceptance.   Dental advisory given  Plan Discussed with: CRNA  Anesthesia Plan Comments:        Anesthesia Quick Evaluation

## 2017-06-09 NOTE — Brief Op Note (Signed)
06/09/2017  10:46 AM  PATIENT:  Laura Chambers  76 y.o. female  PRE-OPERATIVE DIAGNOSIS:  previously infected left total knee post placement of antibiotic spacer  POST-OPERATIVE DIAGNOSIS:  previously infected left total knee post placement of antibiotic   PROCEDURE:  Procedure(s): LEFT REVISION KNEE ARTHROPLASTY (Left)  SURGEON:  Surgeon(s) and Role:    [name], MD - Primary  PHYSICIAN ASSISTANT: Benita Stabile, PA-C  ANESTHESIA:   general  EBL:  250 mL   COUNTS:  YES  TOURNIQUET:   Total Tourniquet Time Documented: Thigh (Left) - 120 minutes Total: Thigh (Left) - 120 minutes   DICTATION: .Other Dictation: Dictation Number [id number]  PLAN OF CARE: Admit to inpatient   PATIENT DISPOSITION:  PACU - hemodynamically stable.   Delay start of Pharmacological VTE agent (>24hrs) due to surgical blood loss or risk of bleeding: no

## 2017-06-09 NOTE — Progress Notes (Signed)
ANTICOAGULATION CONSULT NOTE - Initial Consult  Pharmacy Consult for coumadin Indication: VTE prophylaxis  Allergies  Allergen Reactions  . Ativan [Lorazepam] Other (See Comments)    Highly irritable  . Haldol [Haloperidol] Other (See Comments)    Vegetative State  . Epinephrine Other (See Comments)    Reaction:  Increased pts BP and caused shoulder to jerk   . Levofloxacin Nausea And Vomiting  . Lipitor [Atorvastatin] Other (See Comments)    Reaction:  Joint pain and dizziness   . Trovan [Alatrofloxacin] Nausea And Vomiting    Pt states that this med caused pancreatitis.    Patient Measurements: Height: 5\' 6"  (167.6 cm) Weight: 202 lb (91.6 kg) IBW/kg (Calculated) : 59.3  Vital Signs: Temp: 97.3 F (36.3 C) (12/04 1645) Temp Source: Axillary (12/04 1645) BP: 115/57 (12/04 1645) Pulse Rate: 79 (12/04 1645)  Labs: Recent Labs    06/09/17 1753  LABPROT 13.4  INR 1.03    Estimated Creatinine Clearance: 25.4 mL/min (A) (by C-G formula based on SCr of 2.11 mg/dL (H)).   Medical History: Past Medical History:  Diagnosis Date  . Anemia   . Anxiety   . Arthritis   . Atelectasis, left    left lower lung  . Blood transfusion without reported diagnosis   . Complication of anesthesia 2012   "irritated trachea" with cough x 1 year from intubation 2012.  smaller tube used with other surgeries  . Depression    just lost her son a month ago  . Esophageal hernia   . Fibromyalgia   . Headache    occasionally  . HTN (hypertension)   . Hx of colonic polyps   . Hydronephrosis of right kidney   . Hypercholesterolemia   . 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 Obesity   . 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 Osteoporosis   . Renal angiomyolipoma     Medications:  Medications Prior to Admission  Medication Sig Dispense Refill Last Dose  . amLODipine (NORVASC) 10 MG tablet Take 1 tablet (10 mg total) daily by  mouth. 90 tablet 2 06/09/2017 at 0440  . aspirin EC 81 MG tablet Take 81 mg daily by mouth.   06/08/2017 at Unknown time  . atenolol (TENORMIN) 50 MG tablet TAKE 1 TABLET BY MOUTH EVERY DAY 90 tablet 0 06/09/2017 at 0440  . Calcium Carbonate-Vitamin D (CALCIUM 600+D) 600-400 MG-UNIT tablet Take 1 tablet by mouth every other day.   Past Week at Unknown time  . folic acid (FOLVITE) 371 MCG tablet Take 800 mcg daily by mouth.    Past Week at Unknown time  . furosemide (LASIX) 40 MG tablet Take 1 tablet (40 mg total) daily as needed by mouth for edema. 90 tablet 0 06/08/2017 at Unknown time  . gabapentin (NEURONTIN) 300 MG capsule Take 1 capsule (300 mg total) 2 (two) times daily by mouth. 180 capsule 1 06/09/2017 at 0440  . Multiple Vitamin (MULTIVITAMIN WITH MINERALS) TABS tablet Take 1 tablet by mouth every other day.   Past Week at Unknown time  . mupirocin ointment (BACTROBAN) 2 % Apply to affected area 1-2 times daily as needed. (Patient taking differently: Apply 1 application topically daily as needed (skin infection on /right knee/leg). ) 22 g 0 Past Week at Unknown time  . rosuvastatin (CRESTOR) 10 MG tablet Take 1 tablet (10 mg total) daily by mouth. 90 tablet 0 06/08/2017 at Unknown time  .  tamsulosin (FLOMAX) 0.4 MG CAPS capsule Take 1 capsule (0.4 mg total) every evening by mouth. 90 capsule 0 06/08/2017 at Unknown time  . ALPRAZolam (XANAX) 0.25 MG tablet Take 0.25 mg daily as needed by mouth for anxiety.    Taking  . docusate sodium (COLACE) 100 MG capsule Take 1 capsule (100 mg total) by mouth 2 (two) times daily. (Patient taking differently: Take 100 mg by mouth daily. ) 10 capsule 0 Not Taking at Unknown time  . HYDROcodone-acetaminophen (NORCO/VICODIN) 5-325 MG tablet Take 1 tablet every 6 (six) hours as needed by mouth for moderate pain. DO NOT EXCEED 3GM OF APAP IN 24 HOURS FROM ALL SOURCES (Patient taking differently: Take 0.5 tablets by mouth at bedtime. DO NOT EXCEED 3GM OF APAP IN 24 HOURS  FROM ALL SOURCES) 30 tablet 0   . promethazine (PHENERGAN) 25 MG tablet Take 25 mg by mouth every 8 (eight) hours as needed for nausea or vomiting.   0 More than a month at Unknown time   Scheduled:  . [START ON 06/10/2017] amLODipine  10 mg Oral Daily  . [START ON 06/10/2017] atenolol  50 mg Oral Daily  . [START ON 06/10/2017] calcium-vitamin D  1 tablet Oral QODAY  . docusate sodium  100 mg Oral BID  . [START ON 30/07/6008] folic acid  1 mg Oral Daily  . gabapentin  300 mg Oral BID  . [START ON 06/10/2017] multivitamin with minerals  1 tablet Oral QODAY  . [START ON 06/10/2017] rosuvastatin  10 mg Oral Daily  . tamsulosin  0.4 mg Oral QPM    Assessment: 76 yo female s/p L TKA revision. Pharmacy consulted to dose coumadin Hg/hct= 11.1/34.8 on 11/29 INR= 1.03 on 12/4   Goal of Therapy:  INR 2-3 Monitor platelets by anticoagulation protocol: Yes   Plan:  -Coumadin 5mg  po today -Daily PT/INR  Hildred Laser, Pharm D 06/09/2017 6:45 PM

## 2017-06-09 NOTE — H&P (Signed)
TOTAL KNEE REVISION ADMISSION H&P  Patient is being admitted for left revision total knee arthroplasty.  Subjective:  Chief Complaint:left knee pain.  HPI: Laura Chambers, 76 y.o. female, has a history of pain and functional disability in the left knee(s) due to failed previous arthroplasty and with infection.  The indications for the revision of the total knee arthroplasty are history of total knee infection. Onset of symptoms was gradual starting 1 years ago with rapidlly worsening course since that time.  Prior procedures on the left knee(s) include arthroplasty and exicison of previous components and placement of an antibiotic spacer.  Patient currently rates pain in the left knee(s) at 5 out of 10 with activity. There is worsening of pain with activity and weight bearing, pain that interferes with activities of daily living and pain with passive range of motion.  Patient has evidence of temporary antibiotic spacer by imaging studies. This condition presents safety issues increasing the risk of falls. There is no current active infection hopefully.  Patient Active Problem List   Diagnosis Date Noted  . Status post revision of total knee, left 06/09/2017  . Obesity, Class III, BMI 40-49.9 (morbid obesity) ([place]) 12/28/2016  . Normocytic anemia 12/16/2016  . AKI (acute kidney injury) ([place]) 12/15/2016  . Essential hypertension 12/08/2016  . 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 Knee pain, left 11/11/2016  . Pain in left leg 11/10/2016  . Musculoskeletal neck pain 08/19/2015  . Peripheral neuropathy 01/12/2014  . Hyperglycemia 06/16/2012  . 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 Dyslipidemia 06/16/2012  . Globus sensation 02/04/2012  . Nausea 02/04/2012  . Bowel habit changes 02/04/2012   Past Medical History:  Diagnosis Date  . Anemia   . Anxiety   . Arthritis   . Atelectasis, left    left lower lung  . Blood transfusion without  reported diagnosis   . Complication of anesthesia 2012   "irritated trachea" with cough x 1 year from intubation 2012.  smaller tube used with other surgeries  . Depression    just lost her son a month ago  . Esophageal hernia   . Fibromyalgia   . Headache    occasionally  . HTN (hypertension)   . Hx of colonic polyps   . Hydronephrosis of right kidney   . Hypercholesterolemia   . Neuropathy due to medical condition ([place])    bilateral lower legs  . Obesity   . 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 Osteoporosis   . Renal angiomyolipoma     Past Surgical History:  Procedure Laterality Date  . ABDOMINAL HYSTERECTOMY  1969  . BACK SURGERY  2012   rod in back-  lumbar disc removal with bone graft  . blephoroplasty     bilateral  . BREAST BIOPSY  1989  . CHOLECYSTECTOMY  2010  . EXCISIONAL TOTAL KNEE ARTHROPLASTY WITH ANTIBIOTIC SPACERS Left 11/14/2016   Procedure: EXCISIONAL LEFT TOTAL KNEE ARTHROPLASTY WITH  PLACEMENT OF ANTIBIOTIC SPACERS;  Surgeon: [name], MD;  Location: [facility];  Service: Orthopedics;  Laterality: Left;  . NISSEN FUNDOPLICATION    . PARAESOPHAGEAL HERNIA REPAIR  1989  . TONSILLECTOMY  1946  . TOTAL HIP ARTHROPLASTY Right 08/31/2012   Procedure: TOTAL HIP ARTHROPLASTY ANTERIOR APPROACH;  Surgeon: Mauri Pole, MD;  Location: [facility];  Service: Orthopedics;  Laterality: Right;  . TOTAL KNEE ARTHROPLASTY  2001   left  . TOTAL KNEE ARTHROPLASTY  2010   right    Current Facility-Administered Medications  Medication Dose Route Frequency Provider Last Rate Last Dose  . ceFAZolin (ANCEF) 2-4 GM/100ML-% IVPB           . ceFAZolin (ANCEF) IVPB 2g/100 mL premix  2 g Intravenous On Call to OR Pete Pelt, PA-C      . chlorhexidine (HIBICLENS) 4 % liquid 4 application  60 mL Topical Once Erskine Emery W, PA-C      . tranexamic acid (CYKLOKAPRON) 1,000 mg in sodium chloride 0.9 % 100 mL IVPB  1,000 mg Intravenous To OR  [name], MD       Allergies  Allergen Reactions  . Ativan [Lorazepam] Other (See Comments)    Highly irritable  . Haldol [Haloperidol] Other (See Comments)    Vegetative State  . Epinephrine Other (See Comments)    Reaction:  Increased pts BP and caused shoulder to jerk   . Levofloxacin Nausea And Vomiting  . Lipitor [Atorvastatin] Other (See Comments)    Reaction:  Joint pain and dizziness   . Trovan [Alatrofloxacin] Nausea And Vomiting    Pt states that this med caused pancreatitis.    Social History   Tobacco Use  . Smoking status: Never Smoker  . Smokeless tobacco: Never Used  Substance Use Topics  . Alcohol use: No    Family History  Problem Relation Age of Onset  . Diabetes Father   . Osteoarthritis Father   . Heart disease Father   . Stroke Father   . Osteoarthritis Mother   . Stroke Mother   . Pneumonia Mother   . Colon cancer Sister 57  . Colon polyps Unknown        neice/nephew  . Diabetes Sister   . Diabetes Brother       Review of Systems  Musculoskeletal: Positive for joint pain.  All other systems reviewed and are negative.    Objective:  Physical Exam  Constitutional: She is oriented to person, place, and time. She appears well-developed and well-nourished.  HENT:  Head: Normocephalic and atraumatic.  Eyes: EOM are normal. Pupils are equal, round, and reactive to light.  Neck: Normal range of motion. Neck supple.  Cardiovascular: Normal rate and regular rhythm.  Respiratory: Effort normal and breath sounds normal.  GI: Soft. Bowel sounds are normal.  Musculoskeletal:       Left knee: She exhibits decreased range of motion. Tenderness found. Medial joint line and lateral joint line tenderness noted.  Neurological: She is alert and oriented to person, place, and time.  Skin: Skin is warm and dry.  Psychiatric: She has a normal mood and affect.    Vital signs in last 24 hours: Temp:  [97.9 F (36.6 C)] 97.9 F (36.6 C)  (12/04 0557) Pulse Rate:  [67] 67 (12/04 0557) Resp:  [20] 20 (12/04 0557) BP: (129)/(63) 129/63 (12/04 0557) SpO2:  [100 %] 100 % (12/04 0557) Weight:  [202 lb (91.6 kg)] 202 lb (91.6 kg) (12/04 0557)  Labs:  Estimated body mass index is 32.6 kg/m as calculated from the following:   Height as of this encounter: 5\' 6"  (1.676 m).   Weight as of this encounter: 202 lb (91.6 kg).  Imaging Review Plain radiographs demonstrate an intact antibiotic spacer in the left knee Assessment/Plan: Previous left total knee infection status-post excision of all components and placement of a temporary antibiotic spacer.  Now presents for a two-stage revision  She understands fully the recommendation for  surgery to now remove her antibiotic spacer and place a new knee revision arthroplasty.  A discussion of the risks and benefits of surgery has been had in detail and informed consent is obtained.

## 2017-06-09 NOTE — Plan of Care (Signed)
  Coping: Level of anxiety will decrease 06/09/2017 1748 - Progressing by Williams Che, RN   Pain Managment: General experience of comfort will improve 06/09/2017 1748 - Progressing by Williams Che, RN   Safety: Ability to remain free from injury will improve 06/09/2017 1748 - Progressing by Williams Che, RN   Skin Integrity: Risk for impaired skin integrity will decrease 06/09/2017 1748 - Progressing by Williams Che, RN

## 2017-06-09 NOTE — Transfer of Care (Signed)
Immediate Anesthesia Transfer of Care Note  Patient: Laura Chambers  Procedure(s) Performed: LEFT REVISION KNEE ARTHROPLASTY (Left Knee)  Patient Location: PACU  Anesthesia Type:General  Level of Consciousness: awake, alert  and oriented  Airway & Oxygen Therapy: Patient Spontanous Breathing and Patient connected to face mask oxygen  Post-op Assessment: Report given to RN and Post -op Vital signs reviewed and stable  Post vital signs: Reviewed and stable  Last Vitals:  Vitals:   06/09/17 0557  BP: 129/63  Pulse: 67  Resp: 20  Temp: 36.6 C  SpO2: 100%    Last Pain:  Vitals:   06/09/17 0557  TempSrc: Oral  PainSc: 3       Patients Stated Pain Goal: 7 (72/90/21 1155)  Complications: No apparent anesthesia complications

## 2017-06-09 NOTE — Anesthesia Postprocedure Evaluation (Signed)
Anesthesia Post Note  Patient: Laura Chambers  Procedure(s) Performed: LEFT REVISION KNEE ARTHROPLASTY (Left Knee)     Patient location during evaluation: PACU Anesthesia Type: General Level of consciousness: awake and sedated Pain management: pain level controlled Vital Signs Assessment: post-procedure vital signs reviewed and stable Respiratory status: spontaneous breathing, nonlabored ventilation, respiratory function stable and patient connected to nasal cannula oxygen Cardiovascular status: blood pressure returned to baseline and stable Postop Assessment: no apparent nausea or vomiting Anesthetic complications: no    Last Vitals:  Vitals:   06/09/17 1227 06/09/17 1242  BP: (!) 106/49 100/65  Pulse: 76 76  Resp: 13 18  Temp:    SpO2: 100% 100%    Last Pain:  Vitals:   06/09/17 1310  TempSrc:   PainSc: Asleep                 Grayer Sproles,JAMES TERRILL

## 2017-06-09 NOTE — Progress Notes (Signed)
ANTICOAGULATION CONSULT NOTE - Initial Consult  Pharmacy Consult for coumadin Indication: VTE prophylaxis  Allergies  Allergen Reactions  . Ativan [Lorazepam] Other (See Comments)    Highly irritable  . Haldol [Haloperidol] Other (See Comments)    Vegetative State  . Epinephrine Other (See Comments)    Reaction:  Increased pts BP and caused shoulder to jerk   . Levofloxacin Nausea And Vomiting  . Lipitor [Atorvastatin] Other (See Comments)    Reaction:  Joint pain and dizziness   . Trovan [Alatrofloxacin] Nausea And Vomiting    Pt states that this med caused pancreatitis.    Patient Measurements: Height: 5\' 6"  (167.6 cm) Weight: 202 lb (91.6 kg) IBW/kg (Calculated) : 59.3  Vital Signs: Temp: 97.7 F (36.5 C) (12/04 1100) Temp Source: Oral (12/04 0557) BP: 112/78 (12/04 1627) Pulse Rate: 78 (12/04 1627)  Labs: No results for input(s): HGB, HCT, PLT, APTT, LABPROT, INR, HEPARINUNFRC, HEPRLOWMOCWT, CREATININE, CKTOTAL, CKMB, TROPONINI in the last 72 hours.  Estimated Creatinine Clearance: 25.4 mL/min (A) (by C-G formula based on SCr of 2.11 mg/dL (H)).   Medical History: Past Medical History:  Diagnosis Date  . Anemia   . Anxiety   . Arthritis   . Atelectasis, left    left lower lung  . Blood transfusion without reported diagnosis   . Complication of anesthesia 2012   "irritated trachea" with cough x 1 year from intubation 2012.  smaller tube used with other surgeries  . Depression    just lost her son a month ago  . Esophageal hernia   . Fibromyalgia   . Headache    occasionally  . HTN (hypertension)   . Hx of colonic polyps   . Hydronephrosis of right kidney   . Hypercholesterolemia   . Neuropathy due to medical condition ([place])    bilateral lower legs  . Obesity   . 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 Osteoporosis   . Renal angiomyolipoma     Medications:  Medications Prior to Admission  Medication Sig Dispense Refill Last  Dose  . amLODipine (NORVASC) 10 MG tablet Take 1 tablet (10 mg total) daily by mouth. 90 tablet 2 06/09/2017 at 0440  . aspirin EC 81 MG tablet Take 81 mg daily by mouth.   06/08/2017 at Unknown time  . atenolol (TENORMIN) 50 MG tablet TAKE 1 TABLET BY MOUTH EVERY DAY 90 tablet 0 06/09/2017 at 0440  . Calcium Carbonate-Vitamin D (CALCIUM 600+D) 600-400 MG-UNIT tablet Take 1 tablet by mouth every other day.   Past Week at Unknown time  . folic acid (FOLVITE) 025 MCG tablet Take 800 mcg daily by mouth.    Past Week at Unknown time  . furosemide (LASIX) 40 MG tablet Take 1 tablet (40 mg total) daily as needed by mouth for edema. 90 tablet 0 06/08/2017 at Unknown time  . gabapentin (NEURONTIN) 300 MG capsule Take 1 capsule (300 mg total) 2 (two) times daily by mouth. 180 capsule 1 06/09/2017 at 0440  . Multiple Vitamin (MULTIVITAMIN WITH MINERALS) TABS tablet Take 1 tablet by mouth every other day.   Past Week at Unknown time  . mupirocin ointment (BACTROBAN) 2 % Apply to affected area 1-2 times daily as needed. (Patient taking differently: Apply 1 application topically daily as needed (skin infection on /right knee/leg). ) 22 g 0 Past Week at Unknown time  . rosuvastatin (CRESTOR) 10 MG tablet Take 1 tablet (10 mg total) daily by mouth. [place]  tablet 0 06/08/2017 at Unknown time  . tamsulosin (FLOMAX) 0.4 MG CAPS capsule Take 1 capsule (0.4 mg total) every evening by mouth. 90 capsule 0 06/08/2017 at Unknown time  . ALPRAZolam (XANAX) 0.25 MG tablet Take 0.25 mg daily as needed by mouth for anxiety.    Taking  . docusate sodium (COLACE) 100 MG capsule Take 1 capsule (100 mg total) by mouth 2 (two) times daily. (Patient taking differently: Take 100 mg by mouth daily. ) 10 capsule 0 Not Taking at Unknown time  . HYDROcodone-acetaminophen (NORCO/VICODIN) 5-325 MG tablet Take 1 tablet every 6 (six) hours as needed by mouth for moderate pain. DO NOT EXCEED 3GM OF APAP IN 24 HOURS FROM ALL SOURCES (Patient taking  differently: Take 0.5 tablets by mouth at bedtime. DO NOT EXCEED 3GM OF APAP IN 24 HOURS FROM ALL SOURCES) 30 tablet 0   . promethazine (PHENERGAN) 25 MG tablet Take 25 mg by mouth every 8 (eight) hours as needed for nausea or vomiting.   0 More than a month at Unknown time   Scheduled:  . amLODipine  10 mg Oral Daily  . atenolol  50 mg Oral Daily  . Calcium Carbonate-Vitamin D  1 tablet Oral QODAY  . docusate sodium  100 mg Oral BID  . folic acid  546 mcg Oral Daily  . gabapentin  300 mg Oral BID  . multivitamin with minerals  1 tablet Oral QODAY  . rosuvastatin  10 mg Oral Daily  . tamsulosin  0.4 mg Oral QPM    Assessment: 76 yo female s/p L TKA revision. Pharmacy consulted to dose coumadin Hg/hct= 11.1/34.8 on 11/29   Goal of Therapy:  INR 2-3 Monitor platelets by anticoagulation protocol: Yes   Plan:  -Coumadin 5mg  po today -Daily PT/INR  Hildred Laser, Pharm D 06/09/2017 4:48 PM

## 2017-06-09 NOTE — Anesthesia Procedure Notes (Addendum)
Central Venous Catheter Insertion Performed by: Rica Koyanagi, MD, anesthesiologist Start/End12/10/2016 7:15 AM, 06/09/2017 7:32 AM Preanesthetic checklist: patient identified, IV checked, site marked, risks and benefits discussed, surgical consent, monitors and equipment checked, pre-op evaluation and timeout performed Position: Trendelenburg Lidocaine 1% used for infiltration and patient sedated Hand hygiene performed , maximum sterile barriers used  and Seldinger technique used Catheter size: 7.5 Fr Central line was placed.Double lumen Procedure performed using ultrasound guided technique. Ultrasound Notes:anatomy identified, needle tip was noted to be adjacent to the nerve/plexus identified, no ultrasound evidence of intravascular and/or intraneural injection and image(s) printed for medical record Attempts: 1 Following insertion, line sutured. Post procedure assessment: blood return through all ports  Patient tolerated the procedure well with no immediate complications.

## 2017-06-09 NOTE — Anesthesia Procedure Notes (Signed)
Procedure Name: Intubation Date/Time: 06/09/2017 7:45 AM Performed by: Genelle Bal, CRNA Pre-anesthesia Checklist: Patient identified, Emergency Drugs available, Suction available and Patient being monitored Patient Re-evaluated:Patient Re-evaluated prior to induction Oxygen Delivery Method: Circle system utilized Preoxygenation: Pre-oxygenation with 100% oxygen Induction Type: IV induction Ventilation: Mask ventilation without difficulty Laryngoscope Size: Miller and 2 Grade View: Grade I Tube type: Oral Tube size: 7.0 mm Number of attempts: 1 Airway Equipment and Method: Stylet and Oral airway Placement Confirmation: ETT inserted through vocal cords under direct vision,  positive ETCO2 and breath sounds checked- equal and bilateral Secured at: 21 cm Tube secured with: Tape Dental Injury: Teeth and Oropharynx as per pre-operative assessment

## 2017-06-09 NOTE — OR Nursing (Signed)
Melanie from Microbiology called results of stat gram stain at 0903.  Rare WBC's  , no organisms seen. Dr. Ninfa Linden notified.

## 2017-06-10 ENCOUNTER — Other Ambulatory Visit: Payer: Self-pay

## 2017-06-10 ENCOUNTER — Encounter (HOSPITAL_COMMUNITY): Payer: Self-pay | Admitting: General Practice

## 2017-06-10 LAB — BASIC METABOLIC PANEL
ANION GAP: 10 (ref 5–15)
BUN: 32 mg/dL — AB (ref 6–20)
CALCIUM: 8.5 mg/dL — AB (ref 8.9–10.3)
CO2: 24 mmol/L (ref 22–32)
Chloride: 102 mmol/L (ref 101–111)
Creatinine, Ser: 1.75 mg/dL — ABNORMAL HIGH (ref 0.44–1.00)
GFR calc Af Amer: 31 mL/min — ABNORMAL LOW (ref >60)
GFR, EST NON AFRICAN AMERICAN: 27 mL/min — AB (ref >60)
GLUCOSE: 141 mg/dL — AB (ref 65–99)
Potassium: 4.4 mmol/L (ref 3.5–5.1)
SODIUM: 136 mmol/L (ref 135–145)

## 2017-06-10 LAB — CBC
HCT: 24.6 % — ABNORMAL LOW (ref 36.0–46.0)
Hemoglobin: 7.9 g/dL — ABNORMAL LOW (ref 12.0–15.0)
MCH: 28.6 pg (ref 26.0–34.0)
MCHC: 32.1 g/dL (ref 30.0–36.0)
MCV: 89.1 fL (ref 78.0–100.0)
PLATELETS: 199 10*3/uL (ref 150–400)
RBC: 2.76 MIL/uL — ABNORMAL LOW (ref 3.87–5.11)
RDW: 13.9 % (ref 11.5–15.5)
WBC: 11.8 10*3/uL — AB (ref 4.0–10.5)

## 2017-06-10 LAB — PROTIME-INR
INR: 1.05
PROTHROMBIN TIME: 13.6 s (ref 11.4–15.2)

## 2017-06-10 LAB — PREPARE RBC (CROSSMATCH)

## 2017-06-10 MED ORDER — SODIUM CHLORIDE 0.9 % IV SOLN
Freq: Once | INTRAVENOUS | Status: AC
  Administered 2017-06-10: 13:00:00 via INTRAVENOUS

## 2017-06-10 MED ORDER — WARFARIN SODIUM 7.5 MG PO TABS
7.5000 mg | ORAL_TABLET | Freq: Once | ORAL | Status: AC
  Administered 2017-06-10: 7.5 mg via ORAL
  Filled 2017-06-10: qty 1

## 2017-06-10 NOTE — Op Note (Signed)
NAMEKITTY, CADAVID NO.:  [id number]  MEDICAL RECORD NO.:  [id number]  LOCATION:                                 FACILITY:  PHYSICIAN:  Lind Guest. Ninfa Linden, M.D.DATE OF BIRTH:  Mar 22, 1941  DATE OF PROCEDURE:  06/09/2017 DATE OF DISCHARGE:                              OPERATIVE REPORT   PREOPERATIVE DIAGNOSIS:  Status post two-stage revision of left failed/infected total knee arthroplasty, status post excision arthroplasty with placement of antibiotic spacer, status post a long course of IV and then oral antibiotics.  POSTOPERATIVE DIAGNOSIS:  Status post two-stage revision of left failed/infected total knee arthroplasty, status post excision arthroplasty with placement of antibiotic spacer, status post a long course of IV and then oral antibiotics.  PROCEDURE: 1. Removal of temporary antibiotic spacer, left knee. 2. Left knee revision arthroplasty.  IMPLANTS:  DePuy PFC TC3 femoral component size 3 with 4 mm distal and posterior augments medially and laterally with a 12 x 120 stem, size 3 tibial tray with a 13 x 60 stem, size 17.5 mobile bearing polyethylene insert, size 32 patellar button.  SURGEON:  Lind Guest. Ninfa Linden, M.D.  ASSISTANT:  Erskine Emery, PA-C.  ANESTHESIA:  General.  BLOOD LOSS:  Less than 200 mL.  TOURNIQUET TIME:  2 hours.  ANTIBIOTICS:  IV Ancef 2 g.  Intraoperative stat Gram stain negative.  COMPLICATIONS:  None.  INDICATIONS:  Ms. Lyles is a 76 year old female, who had a history of a left total knee arthroplasty done by someone else in town, that was years ago and she continued to have persistent pain in that knee and sought a second opinion from me.  I felt that the knee looked good, but she, given her weight, may need a thicker polyethylene insert.  Before we could do surgery though, she presented to our office and had obvious chronic infection going on.  She wanted me to then treat this and I took her to  the operating room in May of this year and found a deep infection, removed all components at that time and placed a temporary antibiotic spacer where there was a cement spacer.  She then was seen by the Infectious Disease service and on 6 weeks IV antibiotics, followed by oral antibiotics.  She has since cleared the infection and is presenting for revision arthroplasty.  She understands fully the risks and benefits of surgery.  She does have chronic renal insufficiency with a high creatinine, and we are going to watch this closely during this hospitalization.  The risks and benefits of surgery were explained to her in detail and she did wish to proceed.  PROCEDURE DESCRIPTION:  After informed consent was obtained, appropriate left knee was marked.  Anesthesia had placed a central line in the holding room due to the lack of IV access.  She was then brought to the operating room, placed supine on the operating table.  General anesthesia was then obtained.  A nonsterile tourniquet was placed around her upper left thigh.  Foley catheter was also placed as well.  Her left thigh, knee, foot and ankle, and leg were prepped and draped with DuraPrep and sterile drapes.  Time-out was called and she  was identified as correct patient and correct left knee.  We then used an Esmarch to wrap out the leg and tourniquet was inflated to 300 mmHg.  We then made a midline incision over previous incision and carried this proximally and distally.  We dissected down the knee joint and carried out a medial parapatellar arthrotomy and found a joint effusion.  We did send off stat Gram stain and cultures, which came back with rare white blood cells and no organisms.  We were able to remove then the temporary antibiotic spacer.  We started with the tibia first after we cleared synovium from the knee and irrigated the knee with 3 L of normal saline solution using pulsatile lavage.  We did a tibial freshening cut  setting the rotation off the tibial tubercle.  We chose a size 3 tibial tray, and once we made our tibial cut, we were able to then hand ream up to a size 15 reamer for placing a 16 x 60 tibial stem.  Once we were pleased with the tibial aspect of things, we went to the femur and used the intramedullary guide to the femur, hand reaming there for a long stem going up to 15 as well with that.  After that, we then made our distal femoral refreshening cut and then chose a size 3 femur based off this. We set our rotation using our 4-in-1 cutting block, guide, and made our anterior and posterior cuts, followed by our freshening chamfer cuts. We then made our box cut.  We then took a size 3 TC3 femoral trial component with a 13 x 120 stem and placed that down, followed by our size 3 tibia with a 13 x 60 stem.  We then put 10 mm wedges on the tibia because we had to go up to a 17.5 trial insert, which just gave her stability with varus and valgus stressing as well as had her a straight knee with stability with varus and valgus as well as anterior and posterior.  We felt good about this components then and removed all the trial components.  We then put together our real DePuy TC3 femur with posterior and distal femoral 4 mm augments medially and laterally as well as our 13 x 120 stem.  We then put together our tibia component, which was a rotating platform tibial tray size 3 with a 13 x 60 stem with 10 mm offsets medial and laterally.  We then mixed our cement and cemented real tibia component, followed by the real femoral component. We cleaned cement, debris from her knee and then placed our rotating platform polyethylene insert 17.5.  We had also made a refreshing patellar cut and we drilled 3 holes for size 32 patellar button and cemented our patellar button as well.  Once the cement had hardened, we let the tourniquet down and hemostasis was obtained with electrocautery. We then irrigated the  knee with an additional 1 L normal saline solution.  We were then able to close the deep tissue with the arthrotomy with #1 Vicryl suture, followed by 0 Vicryl in the deep tissue, 2-0 Vicryl in subcutaneous tissue, interrupted staples on the skin.  Xeroform and well-padded sterile dressings were applied.  She was awakened, extubated, and taken to the recovery room in stable condition.  All final counts were correct. There were no complications noted.     Lind Guest. Ninfa Linden, M.D.   ______________________________ Lind Guest. Ninfa Linden, M.D.    CYB/MEDQ  D:  06/09/2017  T:  06/09/2017  Job:  [id number]

## 2017-06-10 NOTE — Evaluation (Signed)
Physical Therapy Evaluation Patient Details Name: Laura Chambers MRN: [id number] DOB: [birth year] Today's Date: 06/10/2017   History of Present Illness  Pt is a 76 y/o female who presents s/p L total knee revision on 06/09/17.  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 Pt currently with functional limitations due to the deficits listed below (see PT Problem List). At the time of PT eval pt was able to perform transfers and ambulation with gross min guard assist for balance support and safety with the RW. Pt with notable drop foot on L, and feel referral for AFO would be beneficial at d/c. For now, will try to get her set up with some sort of toe lifter until she can get a proper orthotic. Pt's plan is to return home at d/c - although she lives alone she will have a friend there 24 hours initially. Pt will benefit from skilled PT to increase their independence and safety with mobility to allow discharge to the venue listed below.       Follow Up Recommendations DC plan and follow up therapy as arranged by surgeon;Home health PT;Supervision/Assistance - 24 hour    Equipment Recommendations  None recommended by PT    Recommendations for Other Services       Precautions / Restrictions Precautions Precautions: Fall;Knee Precaution Comments: Pt was educated on positioning - NO pillow/roll/ice pack under knee; towel roll under ankle only.  Required Braces or Orthoses: Knee Immobilizer - Left Restrictions Weight Bearing Restrictions: Yes LLE Weight Bearing: Weight bearing as tolerated      Mobility  Bed Mobility Overal bed mobility: Needs Assistance Bed Mobility: Supine to Sit     Supine to sit: Supervision     General bed mobility comments: HOB elevated. Pt was able to transition to EOB without assistance. Supervision for  safety and heavy use of rails required.   Transfers Overall transfer level: Needs assistance Equipment used: Rolling walker (2 wheeled) Transfers: Sit to/from Stand Sit to Stand: Min guard         General transfer comment: Hands-on guarding to power-up to full standing position. Pt was cued for proper hand placement on seated surface for safety.   Ambulation/Gait Ambulation/Gait assistance: Min guard Ambulation Distance (Feet): 80 Feet Assistive device: Rolling walker (2 wheeled) Gait Pattern/deviations: Step-to pattern;Decreased stride length;Decreased weight shift to left;Trunk flexed;Decreased dorsiflexion - right;Decreased dorsiflexion - left Gait velocity: Decreased Gait velocity interpretation: Below normal speed for age/gender General Gait Details: VC's for sequencing and general safety witht he RW. Pt with increased difficulty demonstrating DF on the L side. States the L foot is numb and wanting to look down at her feet for positioning. Fatigued quickly and took several short standing rest breaks throughout gait training.   Stairs            Wheelchair Mobility    Modified Rankin (Stroke Patients Only)       Balance Overall balance assessment: Needs assistance Sitting-balance support: Feet supported;No upper extremity supported Sitting balance-Leahy Scale: Fair     Standing balance support: Bilateral upper extremity supported;During functional activity Standing balance-Leahy Scale: Poor                               Pertinent Vitals/Pain Pain Assessment: 0-10 Pain Score: 6  Pain Location:  L knee Pain Descriptors / Indicators: Operative site guarding;Sharp Pain Intervention(s): Limited activity within patient's tolerance;Monitored during session;Repositioned    Home Living Family/patient expects to be discharged to:: Private residence Living Arrangements: Alone Available Help at Discharge: Family;Available 24 hours/day Type of Home:  House Home Access: Stairs to enter Entrance Stairs-Rails: None Entrance Stairs-Number of Steps: 2 Home Layout: One level Home Equipment: Walker - 2 wheels;Cane - single point;Shower seat;Wheelchair - manual      Prior Function Level of Independence: Independent with assistive device(s)         Comments: Short distances with the RW and w/c all other times     Hand Dominance   Dominant Hand: Right    Extremity/Trunk Assessment   Upper Extremity Assessment Upper Extremity Assessment: Defer to OT evaluation    Lower Extremity Assessment Lower Extremity Assessment: LLE deficits/detail LLE Deficits / Details: Decreased strength and AROM consistent with above mentioned procedure. Bilateral foot drop noted - L worse than R LLE: Unable to fully assess due to pain LLE Sensation: history of peripheral neuropathy LLE Coordination: decreased gross motor;decreased fine motor    Cervical / Trunk Assessment Cervical / Trunk Assessment: Normal;Other exceptions Cervical / Trunk Exceptions: Noted forward head/rounded shoulders posture  Communication   Communication: No difficulties  Cognition Arousal/Alertness: Awake/alert Behavior During Therapy: WFL for tasks assessed/performed Overall Cognitive Status: Within Functional Limits for tasks assessed                                        General Comments      Exercises Total Joint Exercises Ankle Circles/Pumps: 15 reps Quad Sets: 15 reps Heel Slides: 15 reps Straight Leg Raises: 5 reps(spead out throughout session) Goniometric ROM: 66 AROM seated in L knee   Assessment/Plan    PT Assessment Patient needs continued PT services  PT Problem List Decreased strength;Decreased range of motion;Decreased activity tolerance;Decreased balance;Decreased mobility;Decreased knowledge of use of DME;Decreased safety awareness;Decreased knowledge of precautions;Pain       PT Treatment Interventions DME instruction;Gait  training;Stair training;Functional mobility training;Therapeutic activities;Therapeutic exercise;Neuromuscular re-education;Patient/family education    PT Goals (Current goals can be found in the Care Plan section)  Acute Rehab PT Goals Patient Stated Goal: Home at d/c PT Goal Formulation: With patient Time For Goal Achievement: 06/24/17 Potential to Achieve Goals: Good    Frequency 7X/week   Barriers to discharge        Co-evaluation               AM-PAC PT "6 Clicks" Daily Activity  Outcome Measure Difficulty turning over in bed (including adjusting bedclothes, sheets and blankets)?: Unable Difficulty moving from lying on back to sitting on the side of the bed? : Unable Difficulty sitting down on and standing up from a chair with arms (e.g., wheelchair, bedside commode, etc,.)?: Unable Help needed moving to and from a bed to chair (including a wheelchair)?: A Little Help needed walking in hospital room?: A Little Help needed climbing 3-5 steps with a railing? : Total 6 Click Score: 10    End of Session Equipment Utilized During Treatment: Gait belt;Left knee immobilizer Activity Tolerance: Patient limited by pain;Patient limited by fatigue Patient left: in chair;with call bell/phone within reach;with chair alarm set Nurse Communication: Mobility status PT Visit Diagnosis: Unsteadiness on feet (R26.81);Difficulty in walking, not elsewhere classified (R26.2);Pain;Other symptoms and signs involving the nervous system (R29.898) Pain - Right/Left:  Left Pain - part of body: Knee    Time: 3875-6433 PT Time Calculation (min) (ACUTE ONLY): 47 min   Charges:   PT Evaluation $PT Eval Moderate Complexity: 1 Mod PT Treatments $Gait Training: 8-22 mins $Therapeutic Exercise: 8-22 mins   PT G Codes:        Rolinda Roan, PT, DPT Acute Rehabilitation Services Pager: [phone]   Thelma Comp 06/10/2017, 10:09 AM

## 2017-06-10 NOTE — Evaluation (Signed)
Occupational Therapy Evaluation Patient Details Name: Laura Chambers MRN: [id number] DOB: 12-31-1940 Today's Date: 06/10/2017    History of Present Illness Pt is a 76 y/o female who presents s/p L total knee revision on 06/09/17.  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 Pt will benefit from continued acute OT to address the below listed deficits and maximize independence with basic ADLs prior to d/c to next venue. PTA pt was mod I with ADLs, w/c or rw at baseline and modified techniques for bathing/dressing. Pt is currently mod A with LB ADLs and min guard to min A with functional transfers. Breathing techniques incorporated into session as pt with occasional drop in O2 to upper 80s on RA.      Follow Up Recommendations  DC plan and follow up therapy as arranged by surgeon;Home health OT;Supervision/Assistance - 24 hour    Equipment Recommendations  None recommended by OT    Recommendations for Other Services       Precautions / Restrictions Precautions Precautions: Fall;Knee Precaution Comments: Pt was educated on positioning - NO pillow/roll/ice pack under knee; towel roll under ankle only.  Required Braces or Orthoses: Knee Immobilizer - Left Restrictions Weight Bearing Restrictions: Yes LLE Weight Bearing: Weight bearing as tolerated      Mobility Bed Mobility Overal bed mobility: Needs Assistance Bed Mobility: Supine to Sit     Supine to sit: Supervision     General bed mobility comments: up in chair  Transfers Overall transfer level: Needs assistance Equipment used: Rolling walker (2 wheeled) Transfers: Sit to/from [facility] Sit to Stand: Min guard Stand pivot transfers: Min guard       General transfer comment: from recliner and 3n1.  Cues for technique with rw. min guard for safety and light steady during powerup.     Balance Overall balance assessment: Needs assistance Sitting-balance support: Feet supported;No upper extremity supported Sitting balance-Leahy Scale: Fair     Standing balance support: Bilateral upper extremity supported;During functional activity Standing balance-Leahy Scale: Poor                             ADL either performed or assessed with clinical judgement   ADL Overall ADL's : Needs assistance/impaired Eating/Feeding: Set up;Sitting   Grooming: Set up;Sitting   Upper Body Bathing: Set up;Sitting   Lower Body Bathing: Moderate assistance;Sit to/from stand   Upper Body Dressing : Set up;Sitting   Lower Body Dressing: Moderate assistance;Sit to/from stand   Toilet Transfer: Minimal assistance;Stand-pivot;BSC;RW   Toileting- Clothing Manipulation and Hygiene: Minimal assistance;Sit to/from stand   Tub/ Shower Transfer: Tub transfer;Minimal assistance;Stand-pivot;Tub bench;Rolling walker     General ADL Comments: Pt completed SPT recliner<>BSC. Reviewed ADL strategies.      Vision         Perception     Praxis      Pertinent Vitals/Pain Pain Assessment: Faces Pain Score: 6  Faces Pain Scale: Hurts little more Pain Location: L knee Pain Descriptors / Indicators: Operative site guarding;Sharp Pain Intervention(s): Limited activity within patient's tolerance;Monitored during session;Repositioned     Hand Dominance Right   Extremity/Trunk Assessment Upper Extremity Assessment Upper Extremity Assessment: Generalized weakness;Overall [facility] for tasks assessed   Lower Extremity Assessment Lower Extremity Assessment: Defer to  PT evaluation LLE Deficits / Details: Decreased strength and AROM consistent with above mentioned procedure. Bilateral foot drop noted - L worse than R LLE: Unable to fully assess due to pain LLE Sensation: history of peripheral  neuropathy LLE Coordination: decreased gross motor;decreased fine motor   Cervical / Trunk Assessment Cervical / Trunk Assessment: Normal;Other exceptions Cervical / Trunk Exceptions: Noted forward head/rounded shoulders posture   Communication Communication Communication: No difficulties   Cognition Arousal/Alertness: Awake/alert Behavior During Therapy: WFL for tasks assessed/performed Overall Cognitive Status: Within Functional Limits for tasks assessed                                     General Comments       Exercises Exercises: Total Joint Total Joint Exercises Ankle Circles/Pumps: 15 reps Quad Sets: 15 reps Heel Slides: 15 reps Straight Leg Raises: 5 reps(spead out throughout session) Goniometric ROM: 66 AROM seated in L knee   Shoulder Instructions      Home Living Family/patient expects to be discharged to:: Private residence Living Arrangements: Alone Available Help at Discharge: Family;Available 24 hours/day Type of Home: House Home Access: Stairs to enter [employer] of Steps: 2 Entrance Stairs-Rails: None Home Layout: One level     Bathroom Shower/Tub: Teacher, early years/pre: Handicapped height     Home Equipment: Environmental consultant - 2 wheels;Cane - single point;Shower seat;Wheelchair - manual   Additional Comments: has access to deceased mother's house with hardwood floors (her carpet is plush).  2 STE. Pt reports shower seat enables her to sit and then bring legs across tub wall.      Prior Functioning/Environment Level of Independence: Independent with assistive device(s)        Comments: Short distances with the RW and w/c all other times        OT Problem List: Impaired balance (sitting and/or standing);Decreased knowledge of use of DME or AE;Decreased knowledge of precautions;Pain      OT Treatment/Interventions: Self-care/ADL training;DME and/or AE instruction;Therapeutic activities;Balance  training;Patient/family education    OT Goals(Current goals can be found in the care plan section) Acute Rehab OT Goals Patient Stated Goal: Home at d/c OT Goal Formulation: With patient Time For Goal Achievement: 06/24/17 Potential to Achieve Goals: Good ADL Goals Pt Will Perform Lower Body Bathing: with modified independence;sit to/from stand Pt Will Perform Lower Body Dressing: with modified independence;sit to/from stand Pt Will Transfer to Toilet: with modified independence;ambulating Pt Will Perform Toileting - Clothing Manipulation and hygiene: with modified independence;sit to/from stand Pt Will Perform Tub/Shower Transfer: with modified independence;ambulating;tub bench;rolling walker Additional ADL Goal #1: Pt will complete bed mobility at mod I level to prepare for OOB ADLs.  OT Frequency: Min 2X/week   Barriers to D/C:            Co-evaluation              AM-PAC PT "6 Clicks" Daily Activity     Outcome Measure Help from another person eating meals?: None Help from another person taking care of personal grooming?: A Little Help from another person toileting, which includes using toliet, bedpan, or urinal?: A Little Help from another person bathing (including washing, rinsing, drying)?: A Lot Help from another person to put on and taking off regular upper body clothing?: A Little Help from another person to put on and taking off regular lower body clothing?: A Lot 6 Click Score:  17   End of Session Equipment Utilized During Treatment: Rolling walker  Activity Tolerance: Patient tolerated treatment well;Other (comment)(O2 on RA dropping occasionally to mid-upper 80s. ) Patient left: in chair  OT Visit Diagnosis: Unsteadiness on feet (R26.81);Pain Pain - Right/Left: Left Pain - part of body: Knee                Time: 5945-8592 OT Time Calculation (min): 20 min Charges:  OT General Charges $OT Visit: 1 Visit OT Evaluation $OT Eval Low Complexity: 1  Low G-Codes:       Hortencia Pilar 06/10/2017, 11:53 AM

## 2017-06-10 NOTE — Progress Notes (Signed)
Physical Therapy Treatment Patient Details Name: Laura Chambers MRN: [id number] DOB: July 03, 1941 Today's Date: 06/10/2017    History of Present Illness Pt is a 76 y/o female who presents s/p L total knee revision on 06/09/17.  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 Due to blood transfusion, session focused on therapeutic exercise and pre-gait activity. Was able to perform transfers with gross min guard assist for general safety. Pt was able to tolerate pre-gait activity without knee immobilizer donned and noted no buckling. Feel she is ready to attempt ambulation without KI next session. Will continue to follow.   Follow Up Recommendations  DC plan and follow up therapy as arranged by surgeon;Home health PT;Supervision/Assistance - 24 hour     Equipment Recommendations  None recommended by PT    Recommendations for Other Services       Precautions / Restrictions Precautions Precautions: Fall;Knee Precaution Comments: Pt was educated on positioning - NO pillow/roll/ice pack under knee; towel roll under ankle only.  Restrictions Weight Bearing Restrictions: Yes LLE Weight Bearing: Weight bearing as tolerated    Mobility  Bed Mobility               General bed mobility comments: up in chair upon PT arrival  Transfers Overall transfer level: Needs assistance Equipment used: Rolling walker (2 wheeled) Transfers: Sit to/from [facility] Sit to Stand: Min guard         General transfer comment: Pt demonstrated proper hand placement on seated surface for safety with transition to stand, but required cues when returning to sit.   Ambulation/Gait             General Gait Details: Deferred gait training as pt was receiving blood transfusion during session. Pt did participate in  pre-gait activity without KI donned at edge of chair. She was able to tolerate weight shifting, standing terminal knee extension, and light balance activity.    Stairs            Wheelchair Mobility    Modified Rankin (Stroke Patients Only)       Balance Overall balance assessment: Needs assistance Sitting-balance support: Feet supported;No upper extremity supported Sitting balance-Leahy Scale: Fair     Standing balance support: Bilateral upper extremity supported;During functional activity Standing balance-Leahy Scale: Poor                              Cognition Arousal/Alertness: Awake/alert Behavior During Therapy: WFL for tasks assessed/performed Overall Cognitive Status: Within Functional Limits for tasks assessed                                        Exercises Total Joint Exercises Quad Sets: 10 reps Heel Slides: 10 reps Hip ABduction/ADduction: 10 reps Straight Leg Raises: 10 reps Long Arc Quad: 10 reps Goniometric ROM: 75 AROM in sitting - L knee    General Comments        Pertinent Vitals/Pain Pain Assessment: 0-10 Pain Score: 5  Pain Location: L knee Pain Descriptors / Indicators: Operative site guarding;Sharp Pain Intervention(s): Limited activity within patient's tolerance;Monitored during session;Repositioned    Home Living Family/patient expects to be discharged to:: Private residence  Living Arrangements: Alone                  Prior Function            PT Goals (current goals can now be found in the care plan section) Acute Rehab PT Goals Patient Stated Goal: Home at d/c PT Goal Formulation: With patient Time For Goal Achievement: 06/24/17 Potential to Achieve Goals: Good Progress towards PT goals: Progressing toward goals    Frequency    7X/week      PT Plan Current plan remains appropriate    Co-evaluation              AM-PAC PT "6 Clicks" Daily Activity  Outcome Measure   Difficulty turning over in bed (including adjusting bedclothes, sheets and blankets)?: Unable Difficulty moving from lying on back to sitting on the side of the bed? : Unable Difficulty sitting down on and standing up from a chair with arms (e.g., wheelchair, bedside commode, etc,.)?: Unable Help needed moving to and from a bed to chair (including a wheelchair)?: A Little Help needed walking in hospital room?: A Little Help needed climbing 3-5 steps with a railing? : Total 6 Click Score: 10    End of Session Equipment Utilized During Treatment: Gait belt Activity Tolerance: Patient limited by pain;Patient limited by fatigue Patient left: in chair;with call bell/phone within reach;with chair alarm set Nurse Communication: Mobility status PT Visit Diagnosis: Unsteadiness on feet (R26.81);Difficulty in walking, not elsewhere classified (R26.2);Pain;Other symptoms and signs involving the nervous system (R29.898) Pain - Right/Left: Left Pain - part of body: Knee     Time: 1339-1401 PT Time Calculation (min) (ACUTE ONLY): 22 min  Charges:  $Therapeutic Activity: 8-22 mins                    G Codes:       Rolinda Roan, PT, DPT Acute Rehabilitation Services Pager: [phone]    Thelma Comp 06/10/2017, 3:25 PM

## 2017-06-10 NOTE — Progress Notes (Signed)
Subjective: 1 Day Post-Op Procedure(s) (LRB): LEFT REVISION KNEE ARTHROPLASTY (Left) Patient reports pain as moderate.  Acute blood loss anemia from her surgery, but vitals stable and asymptomatic.  Creatinine actually improved.  Objective: Vital signs in last 24 hours: Temp:  [97.3 F (36.3 C)-98.8 F (37.1 C)] 98.3 F (36.8 C) (12/05 0806) Pulse Rate:  [70-82] 80 (12/05 0806) Resp:  [4-23] 16 (12/05 0414) BP: (95-139)/(49-89) 107/51 (12/05 0806) SpO2:  [93 %-100 %] 96 % (12/05 0806)  Intake/Output from previous day: 12/04 0701 - 12/05 0700 In: [place] [P.O.:240; I.V.:1500; IV Piggyback:100] Out: 1550 [Urine:1300; Blood:250] Intake/Output this shift: No intake/output data recorded.  Recent Labs    06/10/17 0353  HGB 7.9*   Recent Labs    06/10/17 0353  WBC 11.8*  RBC 2.76*  HCT 24.6*  PLT 199   Recent Labs    06/10/17 0353  NA 136  K 4.4  CL 102  CO2 24  BUN 32*  CREATININE 1.75*  GLUCOSE 141*  CALCIUM 8.5*   Recent Labs    06/09/17 1753 06/10/17 0353  INR 1.03 1.05    Sensation intact distally Intact pulses distally Incision: dressing C/D/I Compartment soft  Assessment/Plan: 1 Day Post-Op Procedure(s) (LRB): LEFT REVISION KNEE ARTHROPLASTY (Left) Up with therapy - WBAT left leg. Will transfuse one unit of blood today due to low hgb that will probably slowly lower more over the next day, which could adversely effect her kidneys. Still get up with therapy today.  [name] 06/10/2017, 8:26 AM

## 2017-06-10 NOTE — Progress Notes (Signed)
ANTICOAGULATION CONSULT NOTE - Initial Consult  Pharmacy Consult for coumadin Indication: VTE prophylaxis  Allergies  Allergen Reactions  . Ativan [Lorazepam] Other (See Comments)    Highly irritable  . Haldol [Haloperidol] Other (See Comments)    Vegetative State  . Epinephrine Other (See Comments)    Reaction:  Increased pts BP and caused shoulder to jerk   . Levofloxacin Nausea And Vomiting  . Lipitor [Atorvastatin] Other (See Comments)    Reaction:  Joint pain and dizziness   . Trovan [Alatrofloxacin] Nausea And Vomiting    Pt states that this med caused pancreatitis.   Patient Measurements: Height: 5\' 6"  (167.6 cm) Weight: 202 lb (91.6 kg) IBW/kg (Calculated) : 59.3  [name] INR 1.05 today. Hgb 7.9, plts wnl.  Goal of Therapy:  INR 2-3 Monitor platelets by anticoagulation protocol: Yes   Plan:  Give Coumadin 7.5mg  PO x 1 Monitor daily INR, CBC, s/s of bleed  Laura Chambers, PharmD, [facility] Clinical Pharmacist Pager [phone] 06/10/2017 8:33 AM

## 2017-06-10 NOTE — Discharge Instructions (Addendum)
Information on my medicine - Coumadin   (Warfarin)  Why was Coumadin prescribed for you? Coumadin was prescribed for you because you have a blood clot or a medical condition that can cause an increased risk of forming blood clots. Blood clots can cause serious health problems by blocking the flow of blood to the heart, lung, or brain. Coumadin can prevent harmful blood clots from forming. As a reminder your indication for Coumadin is:   Blood Clot Prevention After Orthopedic Surgery  What test will check on my response to Coumadin? While on Coumadin (warfarin) you will need to have an INR test regularly to ensure that your dose is keeping you in the desired range. The INR (international normalized ratio) number is calculated from the result of the laboratory test called prothrombin time (PT).  If an INR APPOINTMENT HAS NOT ALREADY BEEN MADE FOR YOU please schedule an appointment to have this lab work done by your health care provider within 7 days. Your INR goal is usually a number between:  2 to 3 or your provider may give you a more narrow range like 2-2.5.  Ask your health care provider during an office visit what your goal INR is.  What  do you need to  know  About  COUMADIN? Take Coumadin (warfarin) exactly as prescribed by your healthcare provider about the same time each day.  DO NOT stop taking without talking to the doctor who prescribed the medication.  Stopping without other blood clot prevention medication to take the place of Coumadin may increase your risk of developing a new clot or stroke.  Get refills before you run out.  What do you do if you miss a dose? If you miss a dose, take it as soon as you remember on the same day then continue your regularly scheduled regimen the next day.  Do not take two doses of Coumadin at the same time.  Important Safety Information A possible side effect of Coumadin (Warfarin) is an increased risk of bleeding. You should call your healthcare  provider right away if you experience any of the following: ? Bleeding from an injury or your nose that does not stop. ? Unusual colored urine (red or dark brown) or unusual colored stools (red or black). ? Unusual bruising for unknown reasons. ? A serious fall or if you hit your head (even if there is no bleeding).  Some foods or medicines interact with Coumadin (warfarin) and might alter your response to warfarin. To help avoid this: ? Eat a balanced diet, maintaining a consistent amount of Vitamin K. ? Notify your provider about major diet changes you plan to make. ? Avoid alcohol or limit your intake to 1 drink for women and 2 drinks for men per day. (1 drink is 5 oz. wine, 12 oz. beer, or 1.5 oz. liquor.)  Make sure that ANY health care provider who prescribes medication for you knows that you are taking Coumadin (warfarin).  Also make sure the healthcare provider who is monitoring your Coumadin knows when you have started a new medication including herbals and non-prescription products.  Coumadin (Warfarin)  Major Drug Interactions  Increased Warfarin Effect Decreased Warfarin Effect  Alcohol (large quantities) Antibiotics (esp. Septra/Bactrim, Flagyl, Cipro) Amiodarone (Cordarone) Aspirin (ASA) Cimetidine (Tagamet) Megestrol (Megace) NSAIDs (ibuprofen, naproxen, etc.) Piroxicam (Feldene) Propafenone (Rythmol SR) Propranolol (Inderal) Isoniazid (INH) Posaconazole (Noxafil) Barbiturates (Phenobarbital) Carbamazepine (Tegretol) Chlordiazepoxide (Librium) Cholestyramine (Questran) Griseofulvin Oral Contraceptives Rifampin Sucralfate (Carafate) Vitamin K   Coumadin (Warfarin) Major Herbal  Interactions  Increased Warfarin Effect Decreased Warfarin Effect  Garlic Ginseng Ginkgo biloba Coenzyme Q10 Green tea St. Johns wort    Coumadin (Warfarin) FOOD Interactions  Eat a consistent number of servings per week of foods HIGH in Vitamin K (1 serving =  cup)  Collards  (cooked, or boiled & drained) Kale (cooked, or boiled & drained) Mustard greens (cooked, or boiled & drained) Parsley *serving size only =  cup Spinach (cooked, or boiled & drained) Swiss chard (cooked, or boiled & drained) Turnip greens (cooked, or boiled & drained)  Eat a consistent number of servings per week of foods MEDIUM-HIGH in Vitamin K (1 serving = 1 cup)  Asparagus (cooked, or boiled & drained) Broccoli (cooked, boiled & drained, or raw & chopped) Brussel sprouts (cooked, or boiled & drained) *serving size only =  cup Lettuce, raw (green leaf, endive, romaine) Spinach, raw Turnip greens, raw & chopped   These websites have more information on Coumadin (warfarin):  FailFactory.se; VeganReport.com.au;   INSTRUCTIONS AFTER JOINT REPLACEMENT   o Remove items at home which could result in a fall. This includes throw rugs or furniture in walking pathways o ICE to the affected joint every three hours while awake for 30 minutes at a time, for at least the first 3-5 days, and then as needed for pain and swelling.  Continue to use ice for pain and swelling. You may notice swelling that will progress down to the foot and ankle.  This is normal after surgery.  Elevate your leg when you are not up walking on it.   o Continue to use the breathing machine you got in the hospital (incentive spirometer) which will help keep your temperature down.  It is common for your temperature to cycle up and down following surgery, especially at night when you are not up moving around and exerting yourself.  The breathing machine keeps your lungs expanded and your temperature down.   DIET:  As you were doing prior to hospitalization, we recommend a well-balanced diet.  DRESSING / WOUND CARE / SHOWERING  Keep the surgical dressing until follow up.  The dressing is water proof, so you can shower without any extra covering.  IF THE DRESSING FALLS OFF or the wound gets wet inside, change  the dressing with sterile gauze.  Please use good hand washing techniques before changing the dressing.  Do not use any lotions or creams on the incision until instructed by your surgeon.    ACTIVITY  o Increase activity slowly as tolerated, but follow the weight bearing instructions below.   o No driving for 6 weeks or until further direction given by your physician.  You cannot drive while taking narcotics.  o No lifting or carrying greater than 10 lbs. until further directed by your surgeon. o Avoid periods of inactivity such as sitting longer than an hour when not asleep. This helps prevent blood clots.  o You may return to work once you are authorized by your doctor.     WEIGHT BEARING   Weight bearing as tolerated with assist device (walker, cane, etc) as directed, use it as long as suggested by your surgeon or therapist, typically at least 4-6 weeks.   EXERCISES  Results after joint replacement surgery are often greatly improved when you follow the exercise, range of motion and muscle strengthening exercises prescribed by your doctor. Safety measures are also important to protect the joint from further injury. Any time any of these exercises cause you to  have increased pain or swelling, decrease what you are doing until you are comfortable again and then slowly increase them. If you have problems or questions, call your caregiver or physical therapist for advice.   Rehabilitation is important following a joint replacement. After just a few days of immobilization, the muscles of the leg can become weakened and shrink (atrophy).  These exercises are designed to build up the tone and strength of the thigh and leg muscles and to improve motion. Often times heat used for twenty to thirty minutes before working out will loosen up your tissues and help with improving the range of motion but do not use heat for the first two weeks following surgery (sometimes heat can increase post-operative  swelling).   These exercises can be done on a training (exercise) mat, on the floor, on a table or on a bed. Use whatever works the best and is most comfortable for you.    Use music or television while you are exercising so that the exercises are a pleasant break in your day. This will make your life better with the exercises acting as a break in your routine that you can look forward to.   Perform all exercises about fifteen times, three times per day or as directed.  You should exercise both the operative leg and the other leg as well.  Exercises include:    Quad Sets - Tighten up the muscle on the front of the thigh (Quad) and hold for 5-10 seconds.    Straight Leg Raises - With your knee straight (if you were given a brace, keep it on), lift the leg to 60 degrees, hold for 3 seconds, and slowly lower the leg.  Perform this exercise against resistance later as your leg gets stronger.   Leg Slides: Lying on your back, slowly slide your foot toward your buttocks, bending your knee up off the floor (only go as far as is comfortable). Then slowly slide your foot back down until your leg is flat on the floor again.   Angel Wings: Lying on your back spread your legs to the side as far apart as you can without causing discomfort.   Hamstring Strength:  Lying on your back, push your heel against the floor with your leg straight by tightening up the muscles of your buttocks.  Repeat, but this time bend your knee to a comfortable angle, and push your heel against the floor.  You may put a pillow under the heel to make it more comfortable if necessary.   A rehabilitation program following joint replacement surgery can speed recovery and prevent re-injury in the future due to weakened muscles. Contact your doctor or a physical therapist for more information on knee rehabilitation.    CONSTIPATION  Constipation is defined medically as fewer than three stools per week and severe constipation as less than  one stool per week.  Even if you have a regular bowel pattern at home, your normal regimen is likely to be disrupted due to multiple reasons following surgery.  Combination of anesthesia, postoperative narcotics, change in appetite and fluid intake all can affect your bowels.   YOU MUST use at least one of the following options; they are listed in order of increasing strength to get the job done.  They are all available over the counter, and you may need to use some, POSSIBLY even all of these options:    Drink plenty of fluids (prune juice may be helpful) and high fiber  foods Colace 100 mg by mouth twice a day  Senokot for constipation as directed and as needed Dulcolax (bisacodyl), take with full glass of water  Miralax (polyethylene glycol) once or twice a day as needed.  If you have tried all these things and are unable to have a bowel movement in the first 3-4 days after surgery call either your surgeon or your primary doctor.    If you experience loose stools or diarrhea, hold the medications until you stool forms back up.  If your symptoms do not get better within 1 week or if they get worse, check with your doctor.  If you experience "the worst abdominal pain ever" or develop nausea or vomiting, please contact the office immediately for further recommendations for treatment.   ITCHING:  If you experience itching with your medications, try taking only a single pain pill, or even half a pain pill at a time.  You can also use Benadryl over the counter for itching or also to help with sleep.   TED HOSE STOCKINGS:  Use stockings on both legs until for at least 2 weeks or as directed by physician office. They may be removed at night for sleeping.  MEDICATIONS:  See your medication summary on the After Visit Summary that nursing will review with you.  You may have some home medications which will be placed on hold until you complete the course of blood thinner medication.  It is important for  you to complete the blood thinner medication as prescribed.  PRECAUTIONS:  If you experience chest pain or shortness of breath - call 911 immediately for transfer to the hospital emergency department.   If you develop a fever greater that 101 F, purulent drainage from wound, increased redness or drainage from wound, foul odor from the wound/dressing, or calf pain - CONTACT YOUR SURGEON.                                                   FOLLOW-UP APPOINTMENTS:  If you do not already have a post-op appointment, please call the office for an appointment to be seen by your surgeon.  Guidelines for how soon to be seen are listed in your After Visit Summary, but are typically between 1-4 weeks after surgery.  OTHER INSTRUCTIONS:   Knee Replacement:  Do not place pillow under knee, focus on keeping the knee straight while resting. CPM instructions: 0-90 degrees, 2 hours in the morning, 2 hours in the afternoon, and 2 hours in the evening. Place foam block, curve side up under heel at all times except when in CPM or when walking.  DO NOT modify, tear, cut, or change the foam block in any way.  MAKE SURE YOU:   Understand these instructions.   Get help right away if you are not doing well or get worse.    Thank you for letting us be a part of your medical care team.  It is a privilege we respect greatly.  We hope these instructions will help you stay on track for a fast and full recovery!

## 2017-06-11 LAB — CBC
HEMATOCRIT: 27.3 % — AB (ref 36.0–46.0)
HEMOGLOBIN: 8.6 g/dL — AB (ref 12.0–15.0)
MCH: 28.8 pg (ref 26.0–34.0)
MCHC: 31.5 g/dL (ref 30.0–36.0)
MCV: 91.3 fL (ref 78.0–100.0)
Platelets: 174 10*3/uL (ref 150–400)
RBC: 2.99 MIL/uL — ABNORMAL LOW (ref 3.87–5.11)
RDW: 14.6 % (ref 11.5–15.5)
WBC: 11.8 10*3/uL — AB (ref 4.0–10.5)

## 2017-06-11 LAB — BPAM RBC
BLOOD PRODUCT EXPIRATION DATE: 201812242359
ISSUE DATE / TIME: 201812051158
UNIT TYPE AND RH: 5100

## 2017-06-11 LAB — TYPE AND SCREEN
ABO/RH(D): O POS
ANTIBODY SCREEN: NEGATIVE
Unit division: 0

## 2017-06-11 LAB — PROTIME-INR
INR: 1.73
Prothrombin Time: 20.1 seconds — ABNORMAL HIGH (ref 11.4–15.2)

## 2017-06-11 MED ORDER — COUMADIN BOOK
Freq: Once | Status: AC
  Administered 2017-06-12: 06:00:00
  Filled 2017-06-11 (×2): qty 1

## 2017-06-11 NOTE — Progress Notes (Signed)
ANTICOAGULATION CONSULT NOTE - Initial Consult  Pharmacy Consult for coumadin Indication: VTE prophylaxis  Allergies  Allergen Reactions  . Ativan [Lorazepam] Other (See Comments)    Highly irritable  . Haldol [Haloperidol] Other (See Comments)    Vegetative State  . Epinephrine Other (See Comments)    Reaction:  Increased pts BP and caused shoulder to jerk   . Levofloxacin Nausea And Vomiting  . Lipitor [Atorvastatin] Other (See Comments)    Reaction:  Joint pain and dizziness   . Trovan [Alatrofloxacin] Nausea And Vomiting    Pt states that this med caused pancreatitis.   Patient Measurements: Height: 5\' 6"  (167.6 cm) Weight: 202 lb (91.6 kg) IBW/kg (Calculated) : 59.3  [name] INR jumped to 1.73 today. Hgb 8.6, plts wnl.  Goal of Therapy:  INR 2-3 Monitor platelets by anticoagulation protocol: Yes   Plan:  Hold Coumadin tonight Monitor daily INR, CBC, s/s of bleed  Would recommend discharging on 5mg  daily with close follow up  Elenor Quinones, PharmD, BCPS Clinical Pharmacist Pager [phone] 06/11/2017 8:32 AM

## 2017-06-11 NOTE — Progress Notes (Signed)
Physical Therapy Treatment Patient Details Name: Laura Chambers MRN: [id number] DOB: [birth year] Today's Date: 06/11/2017    History of Present Illness Pt is a 76 y/o female who presents s/p L total knee revision on 06/09/17.  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 Was able to perform transfers and ambulation with gross min guard assist for balance support and safety. Reinforced education regarding positioning of the knee and encouraged the pt to rest with the knee extended and with frequent short bouts of ice pack use. When I returned this afternoon, pt still had ice pack donned from this morning (5 hours later) and was very warm, and was sitting up with knees flexed. Will continue to follow and progress as able per POC.   Follow Up Recommendations  DC plan and follow up therapy as arranged by surgeon;Home health PT;Supervision/Assistance - 24 hour     Equipment Recommendations  None recommended by PT    Recommendations for Other Services       Precautions / Restrictions Precautions Precautions: Fall;Knee Precaution Comments: Pt was educated on positioning - NO pillow/roll/ice pack under knee; towel roll under ankle only.  Required Braces or Orthoses: Knee Immobilizer - Left Restrictions Weight Bearing Restrictions: Yes LLE Weight Bearing: Weight bearing as tolerated    Mobility  Bed Mobility               General bed mobility comments: Pt sitting up on EOB upon PT arrival.   Transfers Overall transfer level: Needs assistance Equipment used: Rolling walker (2 wheeled) Transfers: Sit to/from Stand Sit to Stand: Min guard Stand pivot transfers: Min guard       General transfer comment: Pt demonstrated proper hand placement on seated surface for safety with transition to stand, but required  cues for positioning hips square in front of chair and reaching back prior to sitting.   Ambulation/Gait Ambulation/Gait assistance: Min guard Ambulation Distance (Feet): 75 Feet Assistive device: Rolling walker (2 wheeled) Gait Pattern/deviations: Step-to pattern;Decreased stride length;Decreased weight shift to left;Trunk flexed;Decreased dorsiflexion - right;Decreased dorsiflexion - left Gait velocity: Decreased Gait velocity interpretation: Below normal speed for age/gender General Gait Details: VC's for sequencing and technique. Pt without KI this session and noted no knee buckle. Pt reports increased fatigue and stiffness - asks to turn around prior to our target distance.    Stairs            Wheelchair Mobility    Modified Rankin (Stroke Patients Only)       Balance Overall balance assessment: Needs assistance Sitting-balance support: Feet supported;No upper extremity supported Sitting balance-Leahy Scale: Fair     Standing balance support: Bilateral upper extremity supported;During functional activity Standing balance-Leahy Scale: Poor                              Cognition Arousal/Alertness: Awake/alert Behavior During Therapy: WFL for tasks assessed/performed Overall Cognitive Status: Within Functional Limits for tasks assessed                                        Exercises Total Joint Exercises Ankle Circles/Pumps: 15 reps Quad Sets: 10 reps Heel Slides: 10 reps Hip ABduction/ADduction:  10 reps Long Arc Quad: 10 reps Goniometric ROM: 64 AROM in sitting - L knee    General Comments        Pertinent Vitals/Pain Pain Assessment: Faces Faces Pain Scale: Hurts even more Pain Location: L knee Pain Descriptors / Indicators: Operative site guarding;Sharp Pain Intervention(s): Limited activity within patient's tolerance;Monitored during session;Repositioned    Home Living                      Prior Function             PT Goals (current goals can now be found in the care plan section) Acute Rehab PT Goals Patient Stated Goal: Home at d/c PT Goal Formulation: With patient Time For Goal Achievement: 06/24/17 Potential to Achieve Goals: Good Progress towards PT goals: Progressing toward goals    Frequency    7X/week      PT Plan Current plan remains appropriate    Co-evaluation              AM-PAC PT "6 Clicks" Daily Activity  Outcome Measure  Difficulty turning over in bed (including adjusting bedclothes, sheets and blankets)?: Unable Difficulty moving from lying on back to sitting on the side of the bed? : Unable Difficulty sitting down on and standing up from a chair with arms (e.g., wheelchair, bedside commode, etc,.)?: Unable Help needed moving to and from a bed to chair (including a wheelchair)?: A Little Help needed walking in hospital room?: A Little Help needed climbing 3-5 steps with a railing? : Total 6 Click Score: 10    End of Session Equipment Utilized During Treatment: Gait belt Activity Tolerance: Patient limited by pain;Patient limited by fatigue Patient left: in chair;with call bell/phone within reach;with chair alarm set Nurse Communication: Mobility status PT Visit Diagnosis: Unsteadiness on feet (R26.81);Difficulty in walking, not elsewhere classified (R26.2);Pain;Other symptoms and signs involving the nervous system (R29.898) Pain - Right/Left: Left Pain - part of body: Knee     Time: 3875-6433 PT Time Calculation (min) (ACUTE ONLY): 21 min  Charges:  $Gait Training: 8-22 mins $Therapeutic Exercise: 8-22 mins                    G Codes:       Rolinda Roan, PT, DPT Acute Rehabilitation Services Pager: [place] 06/11/2017, 3:21 PM

## 2017-06-11 NOTE — Progress Notes (Signed)
Physical Therapy Treatment Patient Details Name: Laura Chambers MRN: [id number] DOB: 09/05/1940 Today's Date: 06/11/2017    History of Present Illness Pt is a 76 y/o female who presents s/p L total knee revision on 06/09/17.  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 Reports increased pain this session however overall mobilizing with improved gait pattern and quad activation. Will continue to follow and progress as able per POC.    Follow Up Recommendations  DC plan and follow up therapy as arranged by surgeon;Home health PT;Supervision/Assistance - 24 hour     Equipment Recommendations  None recommended by PT    Recommendations for Other Services       Precautions / Restrictions Precautions Precautions: Fall;Knee Precaution Comments: Pt was educated on positioning - NO pillow/roll/ice pack under knee; towel roll under ankle only.  Required Braces or Orthoses: Knee Immobilizer - Left Restrictions Weight Bearing Restrictions: Yes LLE Weight Bearing: Weight bearing as tolerated    Mobility  Bed Mobility               General bed mobility comments: Pt sitting up in chair upon PT arrival.   Transfers Overall transfer level: Needs assistance Equipment used: Rolling walker (2 wheeled) Transfers: Sit to/from Stand Sit to Stand: Min guard         General transfer comment: Pt demonstrated proper hand placement on seated surface for safety with transition to stand, but required cues when returning to sit.   Ambulation/Gait Ambulation/Gait assistance: Min guard Ambulation Distance (Feet): 100 Feet Assistive device: Rolling walker (2 wheeled) Gait Pattern/deviations: Step-to pattern;Decreased stride length;Decreased weight shift to left;Trunk flexed;Decreased dorsiflexion - right;Decreased  dorsiflexion - left Gait velocity: Decreased Gait velocity interpretation: Below normal speed for age/gender General Gait Details: VC's for sequencing and technique. Pt without KI this session and noted no knee buckle.   Stairs            Wheelchair Mobility    Modified Rankin (Stroke Patients Only)       Balance Overall balance assessment: Needs assistance Sitting-balance support: Feet supported;No upper extremity supported Sitting balance-Leahy Scale: Fair     Standing balance support: Bilateral upper extremity supported;During functional activity Standing balance-Leahy Scale: Poor                              Cognition Arousal/Alertness: Awake/alert Behavior During Therapy: WFL for tasks assessed/performed Overall Cognitive Status: Within Functional Limits for tasks assessed                                        Exercises Total Joint Exercises Ankle Circles/Pumps: 15 reps Quad Sets: 10 reps Heel Slides: 10 reps Long Arc Quad: 10 reps Goniometric ROM: 64 AROM in sitting - L knee    General Comments        Pertinent Vitals/Pain Pain Assessment: Faces Faces Pain Scale: Hurts even more Pain Location: L knee Pain Descriptors / Indicators: Operative site guarding;Sharp Pain Intervention(s): Limited activity within patient's tolerance;Monitored during session;Repositioned    Home Living                      Prior Function  PT Goals (current goals can now be found in the care plan section) Acute Rehab PT Goals Patient Stated Goal: Home at d/c PT Goal Formulation: With patient Time For Goal Achievement: 06/24/17 Potential to Achieve Goals: Good Progress towards PT goals: Progressing toward goals    Frequency    7X/week      PT Plan Current plan remains appropriate    Co-evaluation              AM-PAC PT "6 Clicks" Daily Activity  Outcome Measure  Difficulty turning over in bed  (including adjusting bedclothes, sheets and blankets)?: Unable Difficulty moving from lying on back to sitting on the side of the bed? : Unable Difficulty sitting down on and standing up from a chair with arms (e.g., wheelchair, bedside commode, etc,.)?: Unable Help needed moving to and from a bed to chair (including a wheelchair)?: A Little Help needed walking in hospital room?: A Little Help needed climbing 3-5 steps with a railing? : Total 6 Click Score: 10    End of Session Equipment Utilized During Treatment: Gait belt Activity Tolerance: Patient limited by pain;Patient limited by fatigue Patient left: in chair;with call bell/phone within reach;with chair alarm set Nurse Communication: Mobility status PT Visit Diagnosis: Unsteadiness on feet (R26.81);Difficulty in walking, not elsewhere classified (R26.2);Pain;Other symptoms and signs involving the nervous system (R29.898) Pain - Right/Left: Left Pain - part of body: Knee     Time: 5883-2549 PT Time Calculation (min) (ACUTE ONLY): 27 min  Charges:  $Gait Training: 8-22 mins $Therapeutic Exercise: 8-22 mins                    G Codes:       Rolinda Roan, PT, DPT Acute Rehabilitation Services Pager: [place] 06/11/2017, 2:27 PM

## 2017-06-11 NOTE — Progress Notes (Signed)
Subjective: 2 Days Post-Op Procedure(s) (LRB): LEFT REVISION KNEE ARTHROPLASTY (Left) Patient reports pain as moderate.    Objective: Vital signs in last 24 hours: Temp:  [97.9 F (36.6 C)-98.7 F (37.1 C)] 98.7 F (37.1 C) (12/06 0440) Pulse Rate:  [67-88] 88 (12/06 0440) Resp:  [16-18] 18 (12/06 0440) BP: (107-131)/(33-58) 131/58 (12/06 0440) SpO2:  [93 %-100 %] 95 % (12/06 0440)  Intake/Output from previous day: 12/05 0701 - 12/06 0700 In: 315 [Blood:315] Out: -  Intake/Output this shift: No intake/output data recorded.  Recent Labs    06/10/17 0353 06/11/17 0336  HGB 7.9* 8.6*   Recent Labs    06/10/17 0353 06/11/17 0336  WBC 11.8* 11.8*  RBC 2.76* 2.99*  HCT 24.6* 27.3*  PLT 199 174   Recent Labs    06/10/17 0353  NA 136  K 4.4  CL 102  CO2 24  BUN 32*  CREATININE 1.75*  GLUCOSE 141*  CALCIUM 8.5*   Recent Labs    06/10/17 0353 06/11/17 0336  INR 1.05 1.73    Sensation intact distally Intact pulses distally Incision: dressing C/D/I No cellulitis present Compartment soft  Assessment/Plan: 2 Days Post-Op Procedure(s) (LRB): LEFT REVISION KNEE ARTHROPLASTY (Left) Up with therapy Plan for discharge tomorrow Discharge home with home health  [name] 06/11/2017, 7:11 AM

## 2017-06-12 LAB — CBC
HCT: 28.1 % — ABNORMAL LOW (ref 36.0–46.0)
Hemoglobin: 8.8 g/dL — ABNORMAL LOW (ref 12.0–15.0)
MCH: 28.9 pg (ref 26.0–34.0)
MCHC: 31.3 g/dL (ref 30.0–36.0)
MCV: 92.1 fL (ref 78.0–100.0)
PLATELETS: 176 10*3/uL (ref 150–400)
RBC: 3.05 MIL/uL — AB (ref 3.87–5.11)
RDW: 14.7 % (ref 11.5–15.5)
WBC: 11.5 10*3/uL — ABNORMAL HIGH (ref 4.0–10.5)

## 2017-06-12 LAB — PROTIME-INR
INR: 3.3
PROTHROMBIN TIME: 33.3 s — AB (ref 11.4–15.2)

## 2017-06-12 MED ORDER — WARFARIN SODIUM 1 MG PO TABS: 1.0000 mg | ORAL_TABLET | Freq: Every day | ORAL | 0 refills | Status: DC

## 2017-06-12 MED ORDER — OXYCODONE-ACETAMINOPHEN 5-325 MG PO TABS: 1.0000 | ORAL_TABLET | ORAL | 0 refills | Status: DC | PRN

## 2017-06-12 MED ORDER — METHOCARBAMOL 500 MG PO TABS: 500.0000 mg | ORAL_TABLET | Freq: Four times a day (QID) | ORAL | 0 refills | Status: DC | PRN

## 2017-06-12 NOTE — Progress Notes (Signed)
Patient ID: Laura Chambers, female   DOB: [birth year], 76 y.o.   MRN: [id number] I will discharge her on just 1 mg of Coumadin daily.

## 2017-06-12 NOTE — Progress Notes (Signed)
Physical Therapy Treatment Patient Details Name: Laura Chambers MRN: [id number] DOB: August 04, 1940 Today's Date: 06/12/2017    History of Present Illness Pt is a 76 y/o female who presents s/p L total knee revision on 06/09/17.  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 Patient unable to perform stairs with RW backwards due to self limiting fear avoidant behavior due to previous falls, however niece and patient revealed they have a wheelchair ramp installed at the home, something they had not divulged to therapy until just now. Given this, patient safe to return home today with care of her niece. No further questions or concerns at this time, and will do well with HHPT.     Follow Up Recommendations  DC plan and follow up therapy as arranged by surgeon;Home health PT;Supervision/Assistance - 24 hour     Equipment Recommendations  None recommended by PT    Recommendations for Other Services       Precautions / Restrictions Precautions Precautions: Fall;Knee Restrictions Weight Bearing Restrictions: Yes LLE Weight Bearing: Weight bearing as tolerated    Mobility  Bed Mobility Overal bed mobility: Needs Assistance Bed Mobility: Supine to Sit;Sit to Supine     Supine to sit: Supervision        Transfers Overall transfer level: Needs assistance Equipment used: Rolling walker (2 wheeled) Transfers: Sit to/from Stand Sit to Stand: Min guard Stand pivot transfers: Min guard       General transfer comment: Pt demonstrated proper hand placement on seated surface for safety with transition to stand, but required cues for positioning hips square in front of chair and reaching back prior to sitting.   Ambulation/Gait Ambulation/Gait assistance: Min  guard Ambulation Distance (Feet): 40 Feet Assistive device: Rolling walker (2 wheeled) Gait Pattern/deviations: Step-to pattern;Decreased stride length;Decreased weight shift to left;Trunk flexed;Decreased dorsiflexion - right;Decreased dorsiflexion - left Gait velocity: decreased       Stairs            Wheelchair Mobility    Modified Rankin (Stroke Patients Only)       Balance Overall balance assessment: Needs assistance Sitting-balance support: Feet supported;No upper extremity supported Sitting balance-Leahy Scale: Fair     Standing balance support: Bilateral upper extremity supported;During functional activity Standing balance-Leahy Scale: Poor                              Cognition Arousal/Alertness: Awake/alert Behavior During Therapy: WFL for tasks assessed/performed Overall Cognitive Status: Within Functional Limits for tasks assessed                                        Exercises      General Comments        Pertinent Vitals/Pain Pain Assessment: Faces Faces Pain Scale: Hurts even more Pain Location: L knee Pain Descriptors / Indicators: Operative site guarding;Sharp Pain Intervention(s): Limited activity within patient's tolerance;Monitored during session;Premedicated before session    Home Living                      Prior Function  PT Goals (current goals can now be found in the care plan section) Acute Rehab PT Goals Patient Stated Goal: Home at d/c PT Goal Formulation: With patient Time For Goal Achievement: 06/24/17 Potential to Achieve Goals: Good Progress towards PT goals: Progressing toward goals    Frequency    7X/week      PT Plan Current plan remains appropriate    Co-evaluation              AM-PAC PT "6 Clicks" Daily Activity  Outcome Measure                   End of Session Equipment Utilized During Treatment: Gait belt Activity Tolerance: Patient  limited by pain;Patient limited by fatigue Patient left: with call bell/phone within reach;in bed Nurse Communication: Mobility status PT Visit Diagnosis: Unsteadiness on feet (R26.81);Difficulty in walking, not elsewhere classified (R26.2);Pain;Other symptoms and signs involving the nervous system (R29.898) Pain - Right/Left: Left Pain - part of body: Knee     Time: 9892-1194 PT Time Calculation (min) (ACUTE ONLY): 24 min  Charges:  $Gait Training: 8-22 mins                    G Codes:       Reinaldo Berber, PT, DPT Acute Rehab Services Pager: [phone]     Reinaldo Berber 06/12/2017, 1:20 PM

## 2017-06-12 NOTE — Plan of Care (Signed)
  Activity: Risk for activity intolerance will decrease 06/12/2017 1044 - Progressing by Williams Che, RN   Nutrition: Adequate nutrition will be maintained 06/12/2017 1044 - Progressing by Williams Che, RN   Coping: Level of anxiety will decrease 06/12/2017 1044 - Progressing by Williams Che, RN   Pain Managment: General experience of comfort will improve 06/12/2017 1044 - Progressing by Williams Che, RN

## 2017-06-12 NOTE — Care Management Note (Signed)
Case Management Note  Patient Details  Name: Laura Chambers MRN: [id number] Date of Birth: [birth year]  Subjective/Objective:   76 yr old female s/p revision of left knee arthroplasty.                  Action/Plan:  Patient was preoperatively setup with [facility], no changes. Will have support at discharge.     Expected Discharge Date:  06/12/17               Expected Discharge Plan:  [place]  In-House Referral:     Discharge planning Services  CM Consult  Post Acute Care Choice:  Home Health Choice offered to:  Patient  DME Arranged:  N/A(Has RW and 3in1) DME Agency:  NA  HH Arranged:  PT [place] Agency:  [facility] (formerly [employer])  Status of Service:  Completed, signed off  If discussed at [employer], dates discussed:    Additional Comments:  Ninfa Meeker, RN 06/12/2017, 1:11 PM

## 2017-06-12 NOTE — Care Management Important Message (Signed)
Important Message  Patient Details  Name: Laura Chambers MRN: [id number] Date of Birth: [birth year]   Medicare Important Message Given:  Yes    Orbie Pyo 06/12/2017, 1:26 PM

## 2017-06-12 NOTE — Progress Notes (Signed)
OT Cancellation Note  Patient Details Name: Laura Chambers MRN: [id number] DOB: [birth year]   Cancelled Treatment:    Reason Eval/Treat Not Completed: Patient declined, no reason specified. Pt politely declined as she is preparing to d/c today. Pt's daughter present and no questions or concerns about ADLs or ADL mobility. Pt has all necessary assist, DME and A/E at home  Britt Bottom 06/12/2017, 1:41 PM

## 2017-06-12 NOTE — Progress Notes (Signed)
Physical Therapy Treatment Patient Details Name: Laura Chambers MRN: [id number] DOB: [birth year] Today's Date: 06/12/2017    History of Present Illness Pt is a 76 y/o female who presents s/p L total knee revision on 06/09/17.  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 Patient limited this session by dizziness with OOB mobility, BP=100/48, SpO2=94 HR=84. RN administered pain and BP meds during session on empty stomach. Discussed with patient safety considerations for likely d/c home today per MD. Unable to progress to stair training this session for safe entry at home,  but plan to revisit with patient later this morning when she is feeling better and make that our focus.     Follow Up Recommendations  DC plan and follow up therapy as arranged by surgeon;Home health PT;Supervision/Assistance - 24 hour     Equipment Recommendations  None recommended by PT    Recommendations for Other Services       Precautions / Restrictions Precautions Precautions: Fall;Knee Restrictions Weight Bearing Restrictions: Yes LLE Weight Bearing: Weight bearing as tolerated    Mobility  Bed Mobility Overal bed mobility: Needs Assistance Bed Mobility: Supine to Sit;Sit to Supine       Sit to supine: Min assist   General bed mobility comments: Pt sitting up on EOB upon PT arrival. Min A for sit to supine for LLE assist  Transfers Overall transfer level: Needs assistance Equipment used: Rolling walker (2 wheeled) Transfers: Sit to/from Stand Sit to Stand: Min guard Stand pivot transfers: Min guard       General transfer comment: Pt demonstrated proper hand placement on seated surface for safety with transition to stand, but required cues for positioning hips square in front of chair  and reaching back prior to sitting.   Ambulation/Gait Ambulation/Gait assistance: Min guard Ambulation Distance (Feet): 20 Feet Assistive device: Rolling walker (2 wheeled) Gait Pattern/deviations: Step-to pattern;Decreased stride length;Decreased weight shift to left;Trunk flexed;Decreased dorsiflexion - right;Decreased dorsiflexion - left Gait velocity: decreased   General Gait Details: Pt reporting diziness and requesting return to room.    Stairs            Wheelchair Mobility    Modified Rankin (Stroke Patients Only)       Balance Overall balance assessment: Needs assistance Sitting-balance support: Feet supported;No upper extremity supported Sitting balance-Leahy Scale: Fair     Standing balance support: Bilateral upper extremity supported;During functional activity Standing balance-Leahy Scale: Poor                              Cognition Arousal/Alertness: Awake/alert Behavior During Therapy: WFL for tasks assessed/performed Overall Cognitive Status: Within Functional Limits for tasks assessed                                        Exercises Total Joint Exercises Ankle Circles/Pumps: 15 reps;AROM;Both Quad Sets: AROM;Both;10 reps Heel Slides: AROM;Both;10 reps Hip ABduction/ADduction: AROM;Both;10 reps Long Arc Quad: AROM;Both;10 reps    General Comments General comments (skin integrity, edema, etc.): Patient limited by diziness this session. BP=100/48, HR:85, SpO2=93% on RA      Pertinent Vitals/Pain Pain Assessment: Faces Faces Pain Scale:  Hurts even more Pain Location: L knee Pain Descriptors / Indicators: Operative site guarding;Sharp Pain Intervention(s): Limited activity within patient's tolerance;Monitored during session;Premedicated before session    Home Living                      Prior Function            PT Goals (current goals can now be found in the care plan section) Acute Rehab PT  Goals Patient Stated Goal: Home at d/c PT Goal Formulation: With patient Time For Goal Achievement: 06/24/17 Potential to Achieve Goals: Good Progress towards PT goals: Progressing toward goals    Frequency    7X/week      PT Plan Current plan remains appropriate    Co-evaluation              AM-PAC PT "6 Clicks" Daily Activity  Outcome Measure  Difficulty turning over in bed (including adjusting bedclothes, sheets and blankets)?: Unable Difficulty moving from lying on back to sitting on the side of the bed? : Unable Difficulty sitting down on and standing up from a chair with arms (e.g., wheelchair, bedside commode, etc,.)?: Unable Help needed moving to and from a bed to chair (including a wheelchair)?: A Little Help needed walking in hospital room?: A Little Help needed climbing 3-5 steps with a railing? : Total 6 Click Score: 10    End of Session Equipment Utilized During Treatment: Gait belt Activity Tolerance: Patient limited by pain;Patient limited by fatigue Patient left: with call bell/phone within reach;in bed Nurse Communication: Mobility status PT Visit Diagnosis: Unsteadiness on feet (R26.81);Difficulty in walking, not elsewhere classified (R26.2);Pain;Other symptoms and signs involving the nervous system (R29.898) Pain - Right/Left: Left Pain - part of body: Knee     Time: 0830-0910(extensive time RN admin medications during session) PT Time Calculation (min) (ACUTE ONLY): 40 min  Charges:  $Gait Training: 8-22 mins $Therapeutic Exercise: 8-22 mins                    G Codes:      Reinaldo Berber, PT, DPT Acute Rehab Services Pager: [phone]     Reinaldo Berber 06/12/2017, 9:24 AM

## 2017-06-12 NOTE — Discharge Summary (Signed)
Patient ID: Laura Chambers MRN: [id number] DOB/AGE: [birth year] 77 y.o.  Admit date: 06/09/2017 Discharge date: 06/12/2017  Admission Diagnoses:  Principal Problem:   Status post revision of total knee, left   Discharge Diagnoses:  Same  Past Medical History:  Diagnosis Date  . Anemia   . Anxiety   . Arthritis   . Atelectasis, left    left lower lung  . Blood transfusion without reported diagnosis   . Complication of anesthesia 2012   "irritated trachea" with cough x 1 year from intubation 2012.  smaller tube used with other surgeries  . Depression    just lost her son a month ago  . Esophageal hernia   . Fibromyalgia   . Headache    occasionally  . HTN (hypertension)   . Hx of colonic polyps   . Hydronephrosis of right kidney   . Hypercholesterolemia   . Neuropathy due to medical condition ([place])    bilateral lower legs  . Obesity   . 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 Osteoporosis   . Renal angiomyolipoma     Surgeries: Procedure(s): LEFT REVISION KNEE ARTHROPLASTY on 06/09/2017   Consultants:   Discharged Condition: Improved  Hospital Course: RAMINA HULET is an 76 y.o. female who was admitted 06/09/2017 for operative treatment ofStatus post revision of total knee, left. Patient has severe unremitting pain that affects sleep, daily activities, and work/hobbies. After pre-op clearance the patient was taken to the operating room on 06/09/2017 and underwent  Procedure(s): LEFT REVISION KNEE ARTHROPLASTY.    Patient was given perioperative antibiotics:  Anti-infectives (From admission, onward)   Start     Dose/Rate Route Frequency Ordered Stop   06/09/17 2200  ceFAZolin (ANCEF) IVPB 1 g/50 mL premix     1 g 100 mL/hr over 30 Minutes Intravenous Every 6 hours 06/09/17 1527 06/10/17 0436   06/09/17 1530  ceFAZolin (ANCEF) 2-4 GM/100ML-% IVPB    Comments:  Bryson Ha   : cabinet override      06/09/17 1530 06/09/17 1532   06/09/17 0547  ceFAZolin (ANCEF) 2-4 GM/100ML-% IVPB    Comments:  Ernesta Amble   : cabinet override      06/09/17 0547 06/09/17 0755   06/09/17 0541  ceFAZolin (ANCEF) IVPB 2g/100 mL premix     2 g 200 mL/hr over 30 Minutes Intravenous On call to O.R. 06/09/17 0541 06/09/17 0825       Patient was given sequential compression devices, early ambulation, and chemoprophylaxis to prevent DVT.  Patient benefited maximally from hospital stay and there were no complications.    Recent vital signs:  Patient Vitals for the past 24 hrs:  BP Temp Temp src Pulse Resp SpO2  06/12/17 0433 (!) 135/50 98.1 F (36.7 C) Oral 90 16 97 %  06/11/17 2010 (!) 112/50 99.4 F (37.4 C) Oral 85 17 90 %  06/11/17 1500 109/73 98.3 F (36.8 C) Oral 79 18 100 %     Recent laboratory studies:  Recent Labs    06/10/17 0353 06/11/17 0336 06/12/17 0401  WBC 11.8* 11.8* 11.5*  HGB 7.9* 8.6* 8.8*  HCT 24.6* 27.3* 28.1*  PLT 199 174 176  NA 136  --   --   K 4.4  --   --   CL 102  --   --   CO2 24  --   --   BUN 32*  --   --   CREATININE 1.75*  --   --  GLUCOSE 141*  --   --   INR 1.05 1.73 3.30  CALCIUM 8.5*  --   --      Discharge Medications:   Allergies as of 06/12/2017      Reactions   Ativan [lorazepam] Other (See Comments)   Highly irritable   Haldol [haloperidol] Other (See Comments)   Vegetative State   Epinephrine Other (See Comments)   Reaction:  Increased pts BP and caused shoulder to jerk    Levofloxacin Nausea And Vomiting   Lipitor [atorvastatin] Other (See Comments)   Reaction:  Joint pain and dizziness    Trovan [alatrofloxacin] Nausea And Vomiting   Pt states that this med caused pancreatitis.      Medication List    STOP taking these medications   aspirin EC 81 MG tablet   HYDROcodone-acetaminophen 5-325 MG tablet Commonly known as:  NORCO/VICODIN     TAKE these medications   ALPRAZolam 0.25 MG tablet Commonly known as:  XANAX Take 0.25 mg daily as needed by  mouth for anxiety.   amLODipine 10 MG tablet Commonly known as:  NORVASC Take 1 tablet (10 mg total) daily by mouth.   atenolol 50 MG tablet Commonly known as:  TENORMIN TAKE 1 TABLET BY MOUTH EVERY DAY   CALCIUM 600+D 600-400 MG-UNIT tablet Generic drug:  Calcium Carbonate-Vitamin D Take 1 tablet by mouth every other day.   docusate sodium 100 MG capsule Commonly known as:  COLACE Take 1 capsule (100 mg total) by mouth 2 (two) times daily. What changed:  when to take this   folic acid 761 MCG tablet Commonly known as:  FOLVITE Take 800 mcg daily by mouth.   furosemide 40 MG tablet Commonly known as:  LASIX Take 1 tablet (40 mg total) daily as needed by mouth for edema.   gabapentin 300 MG capsule Commonly known as:  NEURONTIN Take 1 capsule (300 mg total) 2 (two) times daily by mouth.   methocarbamol 500 MG tablet Commonly known as:  ROBAXIN Take 1 tablet (500 mg total) by mouth every 6 (six) hours as needed for muscle spasms.   multivitamin with minerals Tabs tablet Take 1 tablet by mouth every other day.   mupirocin ointment 2 % Commonly known as:  BACTROBAN Apply to affected area 1-2 times daily as needed. What changed:    how much to take  how to take this  when to take this  reasons to take this  additional instructions   oxyCODONE-acetaminophen 5-325 MG tablet Commonly known as:  ROXICET Take 1-2 tablets by mouth every 4 (four) hours as needed.   promethazine 25 MG tablet Commonly known as:  PHENERGAN Take 25 mg by mouth every 8 (eight) hours as needed for nausea or vomiting.   rosuvastatin 10 MG tablet Commonly known as:  CRESTOR Take 1 tablet (10 mg total) daily by mouth.   tamsulosin 0.4 MG Caps capsule Commonly known as:  FLOMAX Take 1 capsule (0.4 mg total) every evening by mouth.   warfarin 1 MG tablet Commonly known as:  COUMADIN Take 1 tablet (1 mg total) by mouth daily.            Durable Medical Equipment  (From  admission, onward)        Start     Ordered   06/09/17 1643  DME 3 n 1  Once     06/09/17 1642   06/09/17 1643  DME Walker rolling  Once    Question:  Patient  needs a walker to treat with the following condition  Answer:  Status post revision of total knee, left   06/09/17 1642      Diagnostic Studies: Dg Chest Port 1 View  Result Date: 06/09/2017 CLINICAL DATA:  As post central line placement. EXAM: PORTABLE CHEST 1 VIEW COMPARISON:  Portable chest x-ray of December 30, 2016 FINDINGS: The lungs are adequately inflated and clear. There is no pneumothorax. The heart is mildly enlarged but stable. The pulmonary vascularity is normal. There is calcification in the wall of the aortic arch. The mediastinum is normal in width. The right internal jugular venous catheter tip projects over the midportion of the SVC. IMPRESSION: No postprocedure complication following right internal jugular venous catheter placement. Stable mild cardiac enlargement without pulmonary vascular congestion. Thoracic aortic atherosclerosis. Electronically Signed   By: David  Martinique M.D.   On: 06/09/2017 11:38   Dg Knee Left Port  Result Date: 06/09/2017 CLINICAL DATA:  Status post left knee revision EXAM: PORTABLE LEFT KNEE - 1-2 VIEW COMPARISON:  04/23/2017 FINDINGS: Knee replacement is now seen on the left. There is some suggestion of fragmentation in the proximal tibia laterally which was not well visualized on the prior exam. Clinical correlation is recommended. Air is noted in surgical bed. No other focal abnormality is seen. IMPRESSION: Irregularity in the lateral aspect of the proximal left tibia. This may represent some bony fragmentation. This was not visualized on the prior exam. Clinical correlation is recommended. These results will be called to the ordering clinician or representative by the Radiologist Assistant, and communication documented in the PACS or zVision Dashboard. Electronically Signed   By: Inez Catalina  M.D.   On: 06/09/2017 12:43    Disposition: 01-Home or Self Care    Follow-up Information    [name], MD Follow up in 2 week(s).   Specialty:  Orthopedic Surgery Contact information: [place] [address] [phone]            Signed: [name] 06/12/2017, 6:58 AM

## 2017-06-12 NOTE — Progress Notes (Signed)
Provided discharge education/instructions, all questions and concerns addressed, Pt not in distress, discharged home with belongings accompanied by daughter.

## 2017-06-12 NOTE — Progress Notes (Signed)
Patient ID: Laura Chambers, female   DOB: 10/22/1940, 76 y.o.   MRN: [id number] Doing well overall.  Vitals stable.  Left knee stable.  Can be discharged to home today.

## 2017-06-13 DIAGNOSIS — T8454XD Infection and inflammatory reaction due to internal left knee prosthesis, subsequent encounter: Secondary | ICD-10-CM | POA: Diagnosis not present

## 2017-06-13 DIAGNOSIS — T84093D Other mechanical complication of internal left knee prosthesis, subsequent encounter: Secondary | ICD-10-CM | POA: Diagnosis not present

## 2017-06-13 DIAGNOSIS — F329 Major depressive disorder, single episode, unspecified: Secondary | ICD-10-CM | POA: Diagnosis not present

## 2017-06-13 DIAGNOSIS — Z6832 Body mass index (BMI) 32.0-32.9, adult: Secondary | ICD-10-CM | POA: Diagnosis not present

## 2017-06-13 DIAGNOSIS — M21371 Foot drop, right foot: Secondary | ICD-10-CM | POA: Diagnosis not present

## 2017-06-13 DIAGNOSIS — E669 Obesity, unspecified: Secondary | ICD-10-CM | POA: Diagnosis not present

## 2017-06-13 DIAGNOSIS — E785 Hyperlipidemia, unspecified: Secondary | ICD-10-CM | POA: Diagnosis not present

## 2017-06-13 DIAGNOSIS — I119 Hypertensive heart disease without heart failure: Secondary | ICD-10-CM | POA: Diagnosis not present

## 2017-06-13 DIAGNOSIS — M21372 Foot drop, left foot: Secondary | ICD-10-CM | POA: Diagnosis not present

## 2017-06-13 DIAGNOSIS — F419 Anxiety disorder, unspecified: Secondary | ICD-10-CM | POA: Diagnosis not present

## 2017-06-13 DIAGNOSIS — M810 Age-related osteoporosis without current pathological fracture: Secondary | ICD-10-CM | POA: Diagnosis not present

## 2017-06-13 DIAGNOSIS — G629 Polyneuropathy, unspecified: Secondary | ICD-10-CM | POA: Diagnosis not present

## 2017-06-13 DIAGNOSIS — M797 Fibromyalgia: Secondary | ICD-10-CM | POA: Diagnosis not present

## 2017-06-14 LAB — AEROBIC/ANAEROBIC CULTURE (SURGICAL/DEEP WOUND): CULTURE: NO GROWTH

## 2017-06-14 LAB — AEROBIC/ANAEROBIC CULTURE W GRAM STAIN (SURGICAL/DEEP WOUND)

## 2017-06-23 DIAGNOSIS — G629 Polyneuropathy, unspecified: Secondary | ICD-10-CM | POA: Diagnosis not present

## 2017-06-23 DIAGNOSIS — M797 Fibromyalgia: Secondary | ICD-10-CM | POA: Diagnosis not present

## 2017-06-23 DIAGNOSIS — F419 Anxiety disorder, unspecified: Secondary | ICD-10-CM | POA: Diagnosis not present

## 2017-06-23 DIAGNOSIS — Z6832 Body mass index (BMI) 32.0-32.9, adult: Secondary | ICD-10-CM | POA: Diagnosis not present

## 2017-06-23 DIAGNOSIS — M21372 Foot drop, left foot: Secondary | ICD-10-CM | POA: Diagnosis not present

## 2017-06-23 DIAGNOSIS — M810 Age-related osteoporosis without current pathological fracture: Secondary | ICD-10-CM | POA: Diagnosis not present

## 2017-06-23 DIAGNOSIS — T84093D Other mechanical complication of internal left knee prosthesis, subsequent encounter: Secondary | ICD-10-CM | POA: Diagnosis not present

## 2017-06-23 DIAGNOSIS — E785 Hyperlipidemia, unspecified: Secondary | ICD-10-CM | POA: Diagnosis not present

## 2017-06-23 DIAGNOSIS — E669 Obesity, unspecified: Secondary | ICD-10-CM | POA: Diagnosis not present

## 2017-06-23 DIAGNOSIS — M21371 Foot drop, right foot: Secondary | ICD-10-CM | POA: Diagnosis not present

## 2017-06-23 DIAGNOSIS — T8454XD Infection and inflammatory reaction due to internal left knee prosthesis, subsequent encounter: Secondary | ICD-10-CM | POA: Diagnosis not present

## 2017-06-23 DIAGNOSIS — I119 Hypertensive heart disease without heart failure: Secondary | ICD-10-CM | POA: Diagnosis not present

## 2017-06-23 DIAGNOSIS — F329 Major depressive disorder, single episode, unspecified: Secondary | ICD-10-CM | POA: Diagnosis not present

## 2017-06-24 ENCOUNTER — Ambulatory Visit (INDEPENDENT_AMBULATORY_CARE_PROVIDER_SITE_OTHER): Payer: PPO | Admitting: Orthopaedic Surgery

## 2017-06-24 ENCOUNTER — Encounter (INDEPENDENT_AMBULATORY_CARE_PROVIDER_SITE_OTHER): Payer: Self-pay | Admitting: Orthopaedic Surgery

## 2017-06-24 DIAGNOSIS — Z96652 Presence of left artificial knee joint: Secondary | ICD-10-CM

## 2017-06-24 NOTE — Progress Notes (Signed)
The patient is now 2 weeks status post a revision arthroplasty of the left knee.  She is doing well overall.  She is ready for outpatient physical therapy.  On examination of her knee there is some swelling to be expected but no redness.  I removed the staples apply Steri-Strips.  Her calf is soft.  She has been on Coumadin which she can stop now.  She does have dermatitis on both her shins.  She does have foot drop on her left side which is preoperative.  She will likely I would like an AP and lateral of her right need an ASO at some point.  We will give her a prescription for outpatient physical therapy.  All questions and concerns were answered and addressed.  We will see her back in a month see how she is doing overall.  I would like an AP and lateral of her left knee at that visit.

## 2017-06-25 ENCOUNTER — Telehealth (INDEPENDENT_AMBULATORY_CARE_PROVIDER_SITE_OTHER): Payer: Self-pay | Admitting: Orthopaedic Surgery

## 2017-06-25 DIAGNOSIS — M25562 Pain in left knee: Secondary | ICD-10-CM | POA: Diagnosis not present

## 2017-06-25 DIAGNOSIS — L0390 Cellulitis, unspecified: Secondary | ICD-10-CM | POA: Diagnosis not present

## 2017-06-25 DIAGNOSIS — T84093A Other mechanical complication of internal left knee prosthesis, initial encounter: Secondary | ICD-10-CM | POA: Diagnosis not present

## 2017-06-25 NOTE — Telephone Encounter (Signed)
Patient called needing to know when can she take a shower? The number to contact patient  Is [phone]

## 2017-06-25 NOTE — Telephone Encounter (Signed)
LMOM for patient that she can take a shower she just can't submerge her leg under water

## 2017-06-29 DIAGNOSIS — M1712 Unilateral primary osteoarthritis, left knee: Secondary | ICD-10-CM | POA: Diagnosis not present

## 2017-07-06 ENCOUNTER — Other Ambulatory Visit (INDEPENDENT_AMBULATORY_CARE_PROVIDER_SITE_OTHER): Payer: Self-pay

## 2017-07-06 DIAGNOSIS — M1712 Unilateral primary osteoarthritis, left knee: Secondary | ICD-10-CM | POA: Diagnosis not present

## 2017-07-09 DIAGNOSIS — M1712 Unilateral primary osteoarthritis, left knee: Secondary | ICD-10-CM | POA: Diagnosis not present

## 2017-07-13 DIAGNOSIS — M1712 Unilateral primary osteoarthritis, left knee: Secondary | ICD-10-CM | POA: Diagnosis not present

## 2017-07-16 DIAGNOSIS — M1712 Unilateral primary osteoarthritis, left knee: Secondary | ICD-10-CM | POA: Diagnosis not present

## 2017-07-22 DIAGNOSIS — M1712 Unilateral primary osteoarthritis, left knee: Secondary | ICD-10-CM | POA: Diagnosis not present

## 2017-07-26 DIAGNOSIS — M25562 Pain in left knee: Secondary | ICD-10-CM | POA: Diagnosis not present

## 2017-07-26 DIAGNOSIS — L0390 Cellulitis, unspecified: Secondary | ICD-10-CM | POA: Diagnosis not present

## 2017-07-26 DIAGNOSIS — T84093A Other mechanical complication of internal left knee prosthesis, initial encounter: Secondary | ICD-10-CM | POA: Diagnosis not present

## 2017-07-27 ENCOUNTER — Ambulatory Visit (INDEPENDENT_AMBULATORY_CARE_PROVIDER_SITE_OTHER): Payer: PPO | Admitting: Orthopaedic Surgery

## 2017-07-28 DIAGNOSIS — M1712 Unilateral primary osteoarthritis, left knee: Secondary | ICD-10-CM | POA: Diagnosis not present

## 2017-07-29 ENCOUNTER — Ambulatory Visit (INDEPENDENT_AMBULATORY_CARE_PROVIDER_SITE_OTHER): Payer: Self-pay

## 2017-07-29 ENCOUNTER — Ambulatory Visit (INDEPENDENT_AMBULATORY_CARE_PROVIDER_SITE_OTHER): Payer: PPO

## 2017-07-29 ENCOUNTER — Ambulatory Visit (INDEPENDENT_AMBULATORY_CARE_PROVIDER_SITE_OTHER): Payer: PPO | Admitting: Orthopaedic Surgery

## 2017-07-29 ENCOUNTER — Encounter (INDEPENDENT_AMBULATORY_CARE_PROVIDER_SITE_OTHER): Payer: Self-pay | Admitting: Orthopaedic Surgery

## 2017-07-29 DIAGNOSIS — M25561 Pain in right knee: Secondary | ICD-10-CM | POA: Diagnosis not present

## 2017-07-29 DIAGNOSIS — Z96652 Presence of left artificial knee joint: Secondary | ICD-10-CM

## 2017-07-29 DIAGNOSIS — M25551 Pain in right hip: Secondary | ICD-10-CM

## 2017-07-29 NOTE — Progress Notes (Signed)
The patient is now 50 days status post a revision arthroplasty of an infected left total knee.  She had antibiotic spacer for many months.  She has acute kidney disease as well that is since resolved.  She is ambulate with a cane and doing great overall.  On examination of that left knee she has full motion of that knee and it feels stable.  Her incision looks great and there is no evidence of redness.  There is no calf pain.  Her right total knee that was replaced years ago by another Psychologist, sport and exercise in town has excellent range of motion.  It does hurt for her but it does feel ligamentously stable.  She had a right total hip done elsewhere years ago and although she has groin pain the exam is normal.  X-rays of all 3 components in terms of her right hip, her right knee, her left knee showed no complicating features.  This point she will continue to increase her activities.  We do not need to see her back for 6 months unless she is having any issues.  I would like an AP and lateral of her left revision knee at that visit.

## 2017-08-07 DIAGNOSIS — M1712 Unilateral primary osteoarthritis, left knee: Secondary | ICD-10-CM | POA: Diagnosis not present

## 2017-08-13 ENCOUNTER — Ambulatory Visit (INDEPENDENT_AMBULATORY_CARE_PROVIDER_SITE_OTHER): Payer: PPO | Admitting: Physician Assistant

## 2017-08-13 ENCOUNTER — Ambulatory Visit (INDEPENDENT_AMBULATORY_CARE_PROVIDER_SITE_OTHER): Payer: PPO

## 2017-08-13 ENCOUNTER — Encounter (INDEPENDENT_AMBULATORY_CARE_PROVIDER_SITE_OTHER): Payer: Self-pay | Admitting: Physician Assistant

## 2017-08-13 DIAGNOSIS — M79641 Pain in right hand: Secondary | ICD-10-CM

## 2017-08-13 DIAGNOSIS — S66911A Strain of unspecified muscle, fascia and tendon at wrist and hand level, right hand, initial encounter: Secondary | ICD-10-CM | POA: Diagnosis not present

## 2017-08-13 MED ORDER — METHYLPREDNISOLONE 4 MG PO TABS: ORAL_TABLET | ORAL | 0 refills | Status: DC

## 2017-08-13 MED ORDER — HYDROCODONE-ACETAMINOPHEN 5-325 MG PO TABS: 1.0000 | ORAL_TABLET | Freq: Four times a day (QID) | ORAL | 0 refills | Status: DC | PRN

## 2017-08-13 NOTE — Progress Notes (Signed)
Office Visit Note   Patient: Laura Chambers           Date of Birth: [birth year]           MRN: [id number] Visit Date: 08/13/2017              Requested by: Briscoe Deutscher, [address], [place] [address] PCP: Briscoe Deutscher, DO   Assessment & Plan: Visit Diagnoses:  1. Pain in right hand   2. Hand strain, right, initial encounter     Plan: She is placed in a removable Velcro wrist splint.  She will wear the splint during activities.  Place her on a 6-day tapering Medrol Dosepak.  She will follow-up with [place] in 1 week to check her progress.  In regards to the nonhealing sore over the dorsal aspect distal advised her to have a dermatologist look it is.  She also will follow with her primary care physician about her elevated BUN and creatinine levels that these can be checked again.  She has been advised to come off of all NSAIDs while on the Medrol Dosepak and to use these sparingly due to the fact that she has had an elevated BUN and creatinine levels.  Follow-Up Instructions: Return in about 1 week (around 08/20/2017).   Orders:  Orders Placed This Encounter  Procedures  . XR Hand Complete Right   Meds ordered this encounter  Medications  . methylPREDNISolone (MEDROL) 4 MG tablet    Sig: Take as directed    Dispense:  21 tablet    Refill:  0  . HYDROcodone-acetaminophen (NORCO/VICODIN) 5-325 MG tablet    Sig: Take 1 tablet by mouth every 6 (six) hours as needed for moderate pain.    Dispense:  20 tablet    Refill:  0      Procedures: No procedures performed   Clinical Data: No additional findings.   Subjective: Chief Complaint  Patient presents with  . Right Hand - Pain    HPI Is currently is well-known noncompliance surgeries comes in today with right hand pain that started this past Saturday.  She had no known injury.  She is on 1 February she had been doing a lot of pulling up the arm going in a mobile home.  But had no actual injury to the  arm.  On Saturday she noted some swelling started in the right fifth finger now her whole hand and wrist has become swollen.  She states initially it was significantly swollen possibly some redness.  She had no fevers chills shortness of breath.  She did have some numbness in her fingertips but this is resolved as her swellings subsided.  She has tried ice, heat ibuprofen and oxycodone.  She continues to have pain in the wrist and hand.  She denies any history of gout.  She notes she had a small area where she had an abrasion over a year ago that has never completely healed.  She has had no drainage from this area.  She states this area became more pronounced as the swelling started in her hand and arm.   She states overall that her left knee is doing well 65 days status post revision Review of Systems No fevers chills shortness of breath chest pain.  Objective: Vital Signs: There were no vitals taken for this visit.  Physical Exam  Constitutional: She is oriented to person, place, and time. She appears well-developed and well-nourished. No distress.  Pulmonary/Chest: Effort normal.  Neurological: She is alert and oriented to person, place, and time.  Skin: She is not diaphoretic.  Psychiatric: She has a normal mood and affect. Her behavior is normal.    Ortho Exam There is no erythema about the right hand or wrist.  She has maximal tenderness over the lateral aspect of the wrist.  She has some tenderness over the medial aspect of the wrist.  Also has some tenderness over the lateral epicondyle of the right elbow.  There is no signs of gross infection.  She does have a small lentigo with a slight area of erythema . No drainage or gross sign of infection.  She has full sensation and full motor of the right hand.  Radial pulse intact. Specialty Comments:  No specialty comments available.  Imaging: Xr Hand Complete Right  Result Date: 08/13/2017 Right hand 3 views: [facility] joint arthritic changes  noted.  No other bony abnormalities or acute fractures.  Distal wrist no acute fractures noted.  Carpal bones without any signs of malalignment or significant arthritic changes.    PMFS History: Patient Active Problem List   Diagnosis Date Noted  . Status post revision of total knee, left 06/09/2017  . Obesity, Class III, BMI 40-49.9 (morbid obesity) ([place]) 12/28/2016  . Normocytic anemia 12/16/2016  . AKI (acute kidney injury) ([place]) 12/15/2016  . Essential hypertension 12/08/2016  . 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 Knee pain, left 11/11/2016  . Pain in left leg 11/10/2016  . Musculoskeletal neck pain 08/19/2015  . Peripheral neuropathy 01/12/2014  . Hyperglycemia 06/16/2012  . 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 Dyslipidemia 06/16/2012  . Globus sensation 02/04/2012  . Nausea 02/04/2012  . Bowel habit changes 02/04/2012   Past Medical History:  Diagnosis Date  . Anemia   . Anxiety   . Arthritis   . Atelectasis, left    left lower lung  . Blood transfusion without reported diagnosis   . Complication of anesthesia 2012   "irritated trachea" with cough x 1 year from intubation 2012.  smaller tube used with other surgeries  . Depression    just lost her son a month ago  . Esophageal hernia   . Fibromyalgia   . Headache    occasionally  . HTN (hypertension)   . Hx of colonic polyps   . Hydronephrosis of right kidney   . Hypercholesterolemia   . Neuropathy due to medical condition ([place])    bilateral lower legs  . Obesity   . 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 Osteoporosis   . Renal angiomyolipoma     Family History  Problem Relation Age of Onset  . Diabetes Father   . Osteoarthritis Father   . Heart disease Father   . Stroke Father   . Osteoarthritis Mother   . Stroke Mother   . Pneumonia Mother   . Colon cancer Sister 67  . Colon polyps Unknown        neice/nephew  . Diabetes  Sister   . Diabetes Brother     Past Surgical History:  Procedure Laterality Date  . ABDOMINAL HYSTERECTOMY  1969  . BACK SURGERY  2012   rod in back-  lumbar disc removal with bone graft  . blephoroplasty     bilateral  . BREAST BIOPSY  1989  . CHOLECYSTECTOMY  2010  . EXCISIONAL TOTAL KNEE ARTHROPLASTY WITH ANTIBIOTIC SPACERS Left 11/14/2016   Procedure: EXCISIONAL LEFT TOTAL  KNEE ARTHROPLASTY WITH  PLACEMENT OF ANTIBIOTIC SPACERS;  Surgeon: [name], MD;  Location: [facility];  Service: Orthopedics;  Laterality: Left;  . NISSEN FUNDOPLICATION    . PARAESOPHAGEAL HERNIA REPAIR  1989  . REVISION TOTAL KNEE ARTHROPLASTY Left 06/09/2017  . TONSILLECTOMY  1946  . TOTAL HIP ARTHROPLASTY Right 08/31/2012   Procedure: TOTAL HIP ARTHROPLASTY ANTERIOR APPROACH;  Surgeon: Mauri Pole, MD;  Location: [facility];  Service: Orthopedics;  Laterality: Right;  . TOTAL KNEE ARTHROPLASTY  2001   left  . TOTAL KNEE ARTHROPLASTY  2010   right  . 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 Smoking status: Never Smoker  . Smokeless tobacco: Never Used  Substance and Sexual Activity  . Alcohol use: No  . Drug use: No  . Sexual activity: No

## 2017-08-17 ENCOUNTER — Encounter: Payer: Self-pay | Admitting: Family Medicine

## 2017-08-17 ENCOUNTER — Ambulatory Visit (INDEPENDENT_AMBULATORY_CARE_PROVIDER_SITE_OTHER): Payer: PPO | Admitting: Family Medicine

## 2017-08-17 VITALS — BP 140/88 | HR 65 | Temp 98.6°F | Wt 197.4 lb

## 2017-08-17 DIAGNOSIS — E785 Hyperlipidemia, unspecified: Secondary | ICD-10-CM | POA: Diagnosis not present

## 2017-08-17 DIAGNOSIS — D649 Anemia, unspecified: Secondary | ICD-10-CM | POA: Diagnosis not present

## 2017-08-17 DIAGNOSIS — N179 Acute kidney failure, unspecified: Secondary | ICD-10-CM

## 2017-08-17 DIAGNOSIS — F329 Major depressive disorder, single episode, unspecified: Secondary | ICD-10-CM | POA: Insufficient documentation

## 2017-08-17 LAB — COMPREHENSIVE METABOLIC PANEL
ALT: 20 U/L (ref 0–35)
AST: 21 U/L (ref 0–37)
Albumin: 3.8 g/dL (ref 3.5–5.2)
Alkaline Phosphatase: 86 U/L (ref 39–117)
BUN: 49 mg/dL — ABNORMAL HIGH (ref 6–23)
CO2: 25 mEq/L (ref 19–32)
Calcium: 9.8 mg/dL (ref 8.4–10.5)
Chloride: 106 mEq/L (ref 96–112)
Creatinine, Ser: 1.6 mg/dL — ABNORMAL HIGH (ref 0.40–1.20)
GFR: 33.2 mL/min — ABNORMAL LOW (ref >60.00)
Glucose, Bld: 90 mg/dL (ref 70–99)
Potassium: 4.8 mEq/L (ref 3.5–5.1)
Sodium: 141 mEq/L (ref 135–145)
Total Bilirubin: 0.6 mg/dL (ref 0.2–1.2)
Total Protein: 7.5 g/dL (ref 6.0–8.3)

## 2017-08-17 LAB — LIPID PANEL
Cholesterol: 140 mg/dL (ref 0–200)
HDL: 39.1 mg/dL (ref >39.00)
NonHDL: 100.82
Total CHOL/HDL Ratio: 4
Triglycerides: 212 mg/dL — ABNORMAL HIGH (ref 0.0–149.0)
VLDL: 42.4 mg/dL — ABNORMAL HIGH (ref 0.0–40.0)

## 2017-08-17 LAB — LDL CHOLESTEROL, DIRECT: Direct LDL: 58 mg/dL

## 2017-08-17 MED ORDER — FLUOXETINE HCL 20 MG PO CAPS: 20.0000 mg | ORAL_CAPSULE | Freq: Every morning | ORAL | 3 refills | Status: DC

## 2017-08-17 NOTE — Progress Notes (Signed)
Laura Chambers is a 77 y.o. female here for an acute visit.  History of Present Illness:   Lonell Grandchild, CMA acting as scribe for Dr. Briscoe Deutscher.   HPI: Patient in to have follow lab work from surgery. She also like to talk about getting something for nerves. She has been having a lot of issues in personal life.   PMHx, SurgHx, SocialHx, Medications, and Allergies were reviewed in the Visit Navigator and updated as appropriate.  Current Medications:   .  amLODipine (NORVASC) 10 MG tablet, Take 1 tablet (10 mg total) daily by mouth., Disp: 90 tablet, Rfl: 2 .  atenolol (TENORMIN) 50 MG tablet, TAKE 1 TABLET BY MOUTH EVERY DAY, Disp: 90 tablet, Rfl: 0 .  Calcium Carbonate-Vitamin D (CALCIUM 600+D) 600-400 MG-UNIT tablet, Take 1 tablet by mouth every other day., Disp: , Rfl:  .  docusate sodium (COLACE) 100 MG capsule, Take 1 capsule (100 mg total) by mouth 2 (two) times daily. (Patient taking differently: Take 100 mg by mouth daily. ), Disp: 10 capsule, Rfl: 0 .  folic acid (FOLVITE) 941 MCG tablet, Take 800 mcg daily by mouth. , Disp: , Rfl:  .  furosemide (LASIX) 40 MG tablet, Take 1 tablet (40 mg total) daily as needed by mouth for edema., Disp: 90 tablet, Rfl: 0 .  gabapentin (NEURONTIN) 300 MG capsule, Take 1 capsule (300 mg total) 2 (two) times daily by mouth., Disp: 180 capsule, Rfl: 1 .  HYDROcodone-acetaminophen (NORCO/VICODIN) 5-325 MG tablet, Take 1 tablet by mouth every 6 (six) hours as needed for moderate pain., Disp: 20 tablet, Rfl: 0 .  Multiple Vitamin (MULTIVITAMIN WITH MINERALS) TABS tablet, Take 1 tablet by mouth every other day., Disp: , Rfl:  .  promethazine (PHENERGAN) 25 MG tablet, Take 25 mg by mouth every 8 (eight) hours as needed for nausea or vomiting. , Disp: , Rfl: 0 .  rosuvastatin (CRESTOR) 10 MG tablet, Take 1 tablet (10 mg total) daily by mouth., Disp: 90 tablet, Rfl: 0 .  tamsulosin (FLOMAX) 0.4 MG CAPS capsule, Take 1 capsule (0.4 mg total) every  evening by mouth., Disp: 90 capsule, Rfl: 0   Allergies  Allergen Reactions  . Ativan [Lorazepam] Other (See Comments)    Highly irritable  . Haldol [Haloperidol] Other (See Comments)    Vegetative State  . Epinephrine Other (See Comments)    Reaction:  Increased pts BP and caused shoulder to jerk   . Levofloxacin Nausea And Vomiting  . Lipitor [Atorvastatin] Other (See Comments)    Reaction:  Joint pain and dizziness   . Trovan [Alatrofloxacin] Nausea And Vomiting    Pt states that this med caused pancreatitis.   Review of Systems:   Pertinent items are noted in the HPI. Otherwise, ROS is negative.  Vitals:   Vitals:   08/17/17 1038  BP: 140/88  Pulse: 65  Temp: 98.6 F (37 C)  TempSrc: Oral  SpO2: 96%  Weight: 197 lb 6.4 oz (89.5 kg)     Body mass index is 31.86 kg/m.  Physical Exam:   Physical Exam  Constitutional: She appears well-developed and well-nourished. No distress.  HENT:  Head: Normocephalic and atraumatic.  Eyes: EOM are normal. Pupils are equal, round, and reactive to light.  Neck: Normal range of motion. Neck supple.  Cardiovascular: Normal rate, regular rhythm, normal heart sounds and intact distal pulses.  Pulmonary/Chest: Effort normal.  Abdominal: Soft.  Musculoskeletal: She exhibits edema.  Skin: Skin is warm.  Psychiatric:  She has a normal mood and affect. Her behavior is normal.  Nursing note and vitals reviewed.  Assessment and Plan:   1. AKI (acute kidney injury) ([place])  Lab Results  Component Value Date   CREATININE 1.75 (H) 06/10/2017   CREATININE 2.11 (H) 06/04/2017   CREATININE 1.82 (H) 05/08/2017   - Comprehensive metabolic panel  2. Obesity, Class III, BMI 40-49.9 (morbid obesity) ([place]) The patient is asked to make an attempt to improve diet and exercise patterns to aid in medical management of this problem.   3. Dyslipidemia Diet Compliance: noncompliant some of the time. Cardiovascular ROS: no chest pain or dyspnea on  exertion.   Lipids:    Component Value Date/Time   CHOL 136 04/15/2016 1145   TRIG 184 (H) 04/15/2016 1145   HDL 42 (L) 04/15/2016 1145   VLDL 37 (H) 04/15/2016 1145   CHOLHDL 3.2 04/15/2016 1145   - Comprehensive metabolic panel - Lipid panel  4. Reactive depression (situational) Depression symptoms: depressed mood, loss of interests/pleasure, change in sleep. Current psychosocial stressors include: health issues, recent loss of son.   Treatment to date has included None.  Patient denies current suicidal and homicidal ideation.  Patient was given information on SSRIs and possible side effects were reviewed. She was asked to contact us with any worsening in symptoms or suicidal thoughts and we discussed that it would take 2-4 weeks to begin to see improvement in her symptoms.   - FLUoxetine (PROZAC) 20 MG capsule; Take 1 capsule (20 mg total) by mouth every morning.  Dispense: 90 capsule; Refill: 3  5. Normocytic anemia  Lab Results  Component Value Date   WBC 11.5 (H) 06/12/2017   HGB 8.8 (L) 06/12/2017   HCT 28.1 (L) 06/12/2017   MCV 92.1 06/12/2017   PLT 176 06/12/2017   - CBC with Differential/Platelet; Future  . Reviewed expectations re: course of current medical issues. . Discussed self-management of symptoms. . Outlined signs and symptoms indicating need for more acute intervention. . Patient verbalized understanding and all questions were answered. Marland Kitchen Health Maintenance issues including appropriate healthy diet, exercise, and smoking avoidance were discussed with patient. . See orders for this visit as documented in the electronic medical record. . Patient received an After Visit Summary.  CMA served as Education administrator during this visit. History, Physical, and Plan performed by medical provider. The above documentation has been reviewed and is accurate and complete. Briscoe Deutscher, D.O.   Briscoe Deutscher, DO [place], [facility] 08/17/2017

## 2017-08-18 LAB — CBC WITH DIFFERENTIAL/PLATELET
Basophils Absolute: 98 cells/uL (ref 0–200)
Basophils Relative: 0.8 %
Eosinophils Absolute: 134 cells/uL (ref 15–500)
Eosinophils Relative: 1.1 %
HCT: 30.2 % — ABNORMAL LOW (ref 35.0–45.0)
Hemoglobin: 9.8 g/dL — ABNORMAL LOW (ref 11.7–15.5)
Lymphs Abs: 3721 cells/uL (ref 850–3900)
MCH: 28.1 pg (ref 27.0–33.0)
MCHC: 32.5 g/dL (ref 32.0–36.0)
MCV: 86.5 fL (ref 80.0–100.0)
MPV: 12 fL (ref 7.5–12.5)
Monocytes Relative: 8.1 %
Neutro Abs: 7259 cells/uL (ref 1500–7800)
Neutrophils Relative %: 59.5 %
Platelets: 384 10*3/uL (ref 140–400)
RBC: 3.49 10*6/uL — ABNORMAL LOW (ref 3.80–5.10)
RDW: 13.1 % (ref 11.0–15.0)
Total Lymphocyte: 30.5 %
WBC mixed population: 988 cells/uL — ABNORMAL HIGH (ref 200–950)
WBC: 12.2 10*3/uL — ABNORMAL HIGH (ref 3.8–10.8)

## 2017-08-20 ENCOUNTER — Ambulatory Visit (INDEPENDENT_AMBULATORY_CARE_PROVIDER_SITE_OTHER): Payer: PPO | Admitting: Physician Assistant

## 2017-08-24 DIAGNOSIS — L570 Actinic keratosis: Secondary | ICD-10-CM | POA: Diagnosis not present

## 2017-08-24 DIAGNOSIS — X32XXXA Exposure to sunlight, initial encounter: Secondary | ICD-10-CM | POA: Diagnosis not present

## 2017-08-24 DIAGNOSIS — L821 Other seborrheic keratosis: Secondary | ICD-10-CM | POA: Diagnosis not present

## 2017-08-26 ENCOUNTER — Ambulatory Visit (INDEPENDENT_AMBULATORY_CARE_PROVIDER_SITE_OTHER): Payer: PPO | Admitting: Physician Assistant

## 2017-08-26 ENCOUNTER — Ambulatory Visit: Payer: Self-pay

## 2017-08-26 ENCOUNTER — Encounter (INDEPENDENT_AMBULATORY_CARE_PROVIDER_SITE_OTHER): Payer: Self-pay | Admitting: Physician Assistant

## 2017-08-26 DIAGNOSIS — M79641 Pain in right hand: Secondary | ICD-10-CM | POA: Diagnosis not present

## 2017-08-26 DIAGNOSIS — T84093A Other mechanical complication of internal left knee prosthesis, initial encounter: Secondary | ICD-10-CM | POA: Diagnosis not present

## 2017-08-26 DIAGNOSIS — M25562 Pain in left knee: Secondary | ICD-10-CM | POA: Diagnosis not present

## 2017-08-26 DIAGNOSIS — L0390 Cellulitis, unspecified: Secondary | ICD-10-CM | POA: Diagnosis not present

## 2017-08-26 MED ORDER — COLCHICINE 0.6 MG PO TABS: 0.3000 mg | ORAL_TABLET | Freq: Every day | ORAL | 0 refills | Status: DC

## 2017-08-26 NOTE — Progress Notes (Signed)
Office Visit Note   Patient: Laura Chambers           Date of Birth: [birth year]           MRN: [id number] Visit Date: 08/26/2017              Requested by: Briscoe Deutscher, [address] [place], [place] [address] PCP: Briscoe Deutscher, DO   Assessment & Plan: Visit Diagnoses:  1. Pain in right hand     Plan: Explained and is currently due to the fact that she does have pain in her right wrist and hand that responded well to the Medrol Dosepak and now has a left index pinky finger is swollen and painful and slightly warm there is a high suspicion for gout.  Like for her to try a low dose of colchicine 0.3 mg once daily and see if this helps with her symptoms.  However due to her recent acute kidney injury she will talk to her primary care physician for going on this low-dose colchicine.  She has a follow-up appointment for her knee otherwise we will see her back on a as needed basis.  Follow-Up Instructions: No Follow-up on file.   Orders:  No orders of the defined types were placed in this encounter.  Meds ordered this encounter  Medications  . colchicine 0.6 MG tablet    Sig: Take 0.5 tablets (0.3 mg total) by mouth daily.    Dispense:  30 tablet    Refill:  0      Procedures: No procedures performed   Clinical Data: No additional findings.   Subjective: Chief Complaint  Patient presents with  . Right Hand - Follow-up    HPI Laura Chambers returns today for follow-up of her right hand.  She states that the Benadryl Dosepak really helped with her hand.  She is asking for another Medrol Dosepak.  The pain came back soon after she was finished with the Dosepak.  She is continues to wear the Velcro wrist splint on the right hand and feels this helps some.  She is now having left index finger pain and decreased motion.  She had no injury to the finger.  She notes she is having significant amount of swelling in the finger.  She denies any other joints that are  bothering her at this time. Review of Systems See HPI otherwise negative  Objective: Vital Signs: There were no vitals taken for this visit.  Physical Exam  Constitutional: She appears well-developed and well-nourished.  Cardiovascular: Intact distal pulses.  Skin: She is not diaphoretic.  Psychiatric: She has a normal mood and affect.    Ortho Exam Right hand tenderness at the [facility] joint.  Continued tenderness over the lateral aspect of the right wrist.  Left index finger swelling slight warmth no triggering of the finger.  Tenderness maximal at the PIP joint.  No rashes skin lesions ulcerations of the left hand. Bilateral feet she has no tenderness at the first MP joints bilaterally.  No signs of erythema or edema. Specialty Comments:  No specialty comments available.  Imaging: No results found.   PMFS History: Patient Active Problem List   Diagnosis Date Noted  . 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 Status post revision of total knee, left 06/09/2017  . Obesity, Class III, BMI 40-49.9 (morbid obesity) ([place]) 12/28/2016  . Anemia 12/16/2016  . AKI (acute kidney injury) ([place]) 12/15/2016  . Essential hypertension 12/08/2016  . Infected prosthetic knee joint,  sequela 12/03/2016  . Knee pain, left 11/11/2016  . Pain in left leg 11/10/2016  . Musculoskeletal neck pain 08/19/2015  . Peripheral neuropathy 01/12/2014  . Hyperglycemia 06/16/2012  . 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 Dyslipidemia 06/16/2012  . Globus sensation 02/04/2012  . Nausea 02/04/2012  . Bowel habit changes 02/04/2012   Past Medical History:  Diagnosis Date  . Anemia   . Anxiety   . Arthritis   . Atelectasis, left    left lower lung  . Blood transfusion without reported diagnosis   . Complication of anesthesia 2012   "irritated trachea" with cough x 1 year from intubation 2012.  smaller tube used with other surgeries  . Depression    just lost her son  a month ago  . Esophageal hernia   . Fibromyalgia   . Headache    occasionally  . HTN (hypertension)   . Hx of colonic polyps   . Hydronephrosis of right kidney   . Hypercholesterolemia   . Neuropathy due to medical condition ([place])    bilateral lower legs  . Obesity   . 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 Osteoporosis   . Renal angiomyolipoma     Family History  Problem Relation Age of Onset  . Diabetes Father   . Osteoarthritis Father   . Heart disease Father   . Stroke Father   . Osteoarthritis Mother   . Stroke Mother   . Pneumonia Mother   . Colon cancer Sister 6  . Colon polyps Unknown        neice/nephew  . Diabetes Sister   . Diabetes Brother     Past Surgical History:  Procedure Laterality Date  . ABDOMINAL HYSTERECTOMY  1969  . BACK SURGERY  2012   rod in back-  lumbar disc removal with bone graft  . blephoroplasty     bilateral  . BREAST BIOPSY  1989  . CHOLECYSTECTOMY  2010  . EXCISIONAL TOTAL KNEE ARTHROPLASTY WITH ANTIBIOTIC SPACERS Left 11/14/2016   Procedure: EXCISIONAL LEFT TOTAL KNEE ARTHROPLASTY WITH  PLACEMENT OF ANTIBIOTIC SPACERS;  Surgeon: [name], MD;  Location: [facility];  Service: Orthopedics;  Laterality: Left;  . NISSEN FUNDOPLICATION    . PARAESOPHAGEAL HERNIA REPAIR  1989  . REVISION TOTAL KNEE ARTHROPLASTY Left 06/09/2017  . TONSILLECTOMY  1946  . TOTAL HIP ARTHROPLASTY Right 08/31/2012   Procedure: TOTAL HIP ARTHROPLASTY ANTERIOR APPROACH;  Surgeon: Mauri Pole, MD;  Location: [facility];  Service: Orthopedics;  Laterality: Right;  . TOTAL KNEE ARTHROPLASTY  2001   left  . TOTAL KNEE ARTHROPLASTY  2010   right  . 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 Smoking status: Never Smoker  . Smokeless tobacco: Never Used  Substance and Sexual Activity  . Alcohol use: No  . Drug use: No  . Sexual activity: No

## 2017-08-26 NOTE — Telephone Encounter (Signed)
See note

## 2017-08-26 NOTE — Telephone Encounter (Signed)
Pt called regarding RX given to her by Erskine Emery PA. for gout - colchicine 0.6 (told to take 0.3 daily) - she states Erskine Emery PA wanted pt to check with Dr. Juleen China to see if it is ok with her for pt to take med for 1 week to determine if it is gout in her hands. It metabolizes in the liver and wants to make sure it's ok due to pts kidney issues. Please advise.  Reason for Disposition . [1] Caller requesting NON-URGENT health information AND [2] PCP's office is the best resource  Answer Assessment - Initial Assessment Questions 1. REASON FOR CALL or QUESTION: "What is your reason for calling today?" or "How can I best help you?" or "What question do you have that I can help answer?"       Pt called regarding RX given to her by Erskine Emery PA. for gout - colchicine 0.6 (told to take 0.3 daily) - she states Erskine Emery PA wanted pt to check with Dr. Juleen China to see if it is ok with her for pt to take med for 1 week to determine if it is gout in her hands. It metabolizes in the liver and wants to make sure it's ok due to pts kidney issues. Please advise.  Protocols used: INFORMATION ONLY CALL-A-AH

## 2017-08-27 ENCOUNTER — Telehealth: Payer: Self-pay | Admitting: Family Medicine

## 2017-08-27 ENCOUNTER — Telehealth (INDEPENDENT_AMBULATORY_CARE_PROVIDER_SITE_OTHER): Payer: Self-pay | Admitting: Physician Assistant

## 2017-08-27 NOTE — Telephone Encounter (Signed)
Lab Results  Component Value Date   CREATININE 1.60 (H) 08/17/2017   CREATININE 1.75 (H) 06/10/2017   CREATININE 2.11 (H) 06/04/2017   GFR 33.2 on 08/17/17. Renal dosing under 30.  Okay treatment to be used for shortest possible time period.

## 2017-08-27 NOTE — Telephone Encounter (Signed)
Verified Rx with pharmacy

## 2017-08-27 NOTE — Telephone Encounter (Signed)
Left detailed message on voicemail per notes of Dr. Juleen China. See TE on 2/21.

## 2017-08-27 NOTE — Telephone Encounter (Signed)
Please call pharmacy to verify medication for patient called in yesterday. [phone]

## 2017-08-27 NOTE — Telephone Encounter (Signed)
Please advise 

## 2017-08-27 NOTE — Telephone Encounter (Signed)
Left detailed message on voicemail. Per notes of Dr. Juleen China on TE from 2/20 it is ok for the pt to take rx for the shortest possible time period. Advised pt to call office back with additional questions.

## 2017-08-27 NOTE — Telephone Encounter (Signed)
Copied from [place]. Topic: Inquiry >> Aug 27, 2017 10:04 AM Laura Chambers wrote: Pt called back expressing she is in severe pain with her hands.  Asking if the Rx can be filled today.  Please call pt back to let her know if this can be filled.  Previous message: Pt called regarding RX given to her by Dr. Ninfa Linden for gout - colchicine 0.6 (told to take 0.3 daily) - she states Dr. Ninfa Linden wanted pt to check with Dr. Juleen China to see if it is ok with her for pt to take med for 1 week to determine if it is gout in her hands. It metabolizes in the liver and wants to make sure it's ok due to pts kidney issues. Please advise.

## 2017-08-31 ENCOUNTER — Other Ambulatory Visit: Payer: Self-pay

## 2017-08-31 MED ORDER — ATENOLOL 50 MG PO TABS: 50.0000 mg | ORAL_TABLET | Freq: Every day | ORAL | 3 refills | Status: DC

## 2017-09-22 ENCOUNTER — Encounter: Payer: Self-pay | Admitting: Family Medicine

## 2017-09-22 ENCOUNTER — Ambulatory Visit (INDEPENDENT_AMBULATORY_CARE_PROVIDER_SITE_OTHER): Payer: PPO | Admitting: Family Medicine

## 2017-09-22 VITALS — BP 138/76 | HR 63 | Temp 97.7°F | Ht 66.0 in | Wt 191.4 lb

## 2017-09-22 DIAGNOSIS — M79641 Pain in right hand: Secondary | ICD-10-CM

## 2017-09-22 DIAGNOSIS — D649 Anemia, unspecified: Secondary | ICD-10-CM

## 2017-09-22 DIAGNOSIS — I10 Essential (primary) hypertension: Secondary | ICD-10-CM | POA: Diagnosis not present

## 2017-09-22 DIAGNOSIS — M79642 Pain in left hand: Secondary | ICD-10-CM | POA: Diagnosis not present

## 2017-09-22 DIAGNOSIS — N179 Acute kidney failure, unspecified: Secondary | ICD-10-CM

## 2017-09-22 LAB — CBC WITH DIFFERENTIAL/PLATELET
Basophils Absolute: 0.1 10*3/uL (ref 0.0–0.1)
Basophils Relative: 0.7 % (ref 0.0–3.0)
Eosinophils Absolute: 0.3 10*3/uL (ref 0.0–0.7)
Eosinophils Relative: 2.8 % (ref 0.0–5.0)
HCT: 33.8 % — ABNORMAL LOW (ref 36.0–46.0)
Hemoglobin: 11.2 g/dL — ABNORMAL LOW (ref 12.0–15.0)
Lymphocytes Relative: 21.1 % (ref 12.0–46.0)
Lymphs Abs: 2.2 10*3/uL (ref 0.7–4.0)
MCHC: 33.1 g/dL (ref 30.0–36.0)
MCV: 85.7 fl (ref 78.0–100.0)
Monocytes Absolute: 0.8 10*3/uL (ref 0.1–1.0)
Monocytes Relative: 7.5 % (ref 3.0–12.0)
Neutro Abs: 7 10*3/uL (ref 1.4–7.7)
Neutrophils Relative %: 67.9 % (ref 43.0–77.0)
Platelets: 344 10*3/uL (ref 150.0–400.0)
RBC: 3.94 Mil/uL (ref 3.87–5.11)
RDW: 15.1 % (ref 11.5–15.5)
WBC: 10.3 10*3/uL (ref 4.0–10.5)

## 2017-09-22 LAB — COMPREHENSIVE METABOLIC PANEL
ALT: 10 U/L (ref 0–35)
AST: 14 U/L (ref 0–37)
Albumin: 4 g/dL (ref 3.5–5.2)
Alkaline Phosphatase: 98 U/L (ref 39–117)
BUN: 25 mg/dL — ABNORMAL HIGH (ref 6–23)
CO2: 28 mEq/L (ref 19–32)
Calcium: 9.8 mg/dL (ref 8.4–10.5)
Chloride: 103 mEq/L (ref 96–112)
Creatinine, Ser: 1.57 mg/dL — ABNORMAL HIGH (ref 0.40–1.20)
GFR: 33.92 mL/min — ABNORMAL LOW (ref >60.00)
Glucose, Bld: 106 mg/dL — ABNORMAL HIGH (ref 70–99)
Potassium: 4.2 mEq/L (ref 3.5–5.1)
Sodium: 139 mEq/L (ref 135–145)
Total Bilirubin: 0.7 mg/dL (ref 0.2–1.2)
Total Protein: 7.7 g/dL (ref 6.0–8.3)

## 2017-09-22 LAB — VITAMIN B12: Vitamin B-12: 270 pg/mL (ref 211–911)

## 2017-09-22 LAB — C-REACTIVE PROTEIN: CRP: 1.6 mg/dL (ref 0.5–20.0)

## 2017-09-22 LAB — URIC ACID: Uric Acid, Serum: 5.7 mg/dL (ref 2.4–7.0)

## 2017-09-22 LAB — SEDIMENTATION RATE: Sed Rate: 58 mm/hr — ABNORMAL HIGH (ref 0–30)

## 2017-09-22 MED ORDER — PREDNISONE 5 MG PO TABS: ORAL_TABLET | ORAL | 0 refills | Status: DC

## 2017-09-22 NOTE — Progress Notes (Signed)
Laura Chambers is a 77 y.o. female is here for follow up.  History of Present Illness:   Shaune Pascal CMA acting as scribe for Dr. Juleen China.  HPI: Patient come in today for gout in both hands. She has been seeing orthopedics for this. Patient stated that she has been on one round of prednisone and taken colchicine. She noticed a big difference with taking the prednisone. When she went off symptoms came back.   Review of Systems  Constitutional: Negative for chills and fever.  HENT: Negative for congestion and ear pain.   Eyes: Negative for blurred vision and double vision.  Respiratory: Negative for cough and shortness of breath.   Cardiovascular: Negative for chest pain and palpitations.  Gastrointestinal: Negative for abdominal pain and vomiting.  Genitourinary: Negative for dysuria and frequency.  Musculoskeletal: Positive for joint pain. Negative for back pain and neck pain.  Neurological: Negative for dizziness and headaches.  Psychiatric/Behavioral: Negative for depression and suicidal ideas.   Health Maintenance Due  Topic Date Due  . PNA vac Low Risk Adult (1 of 2 - PCV13) 05/18/2005   Depression screen [facility] 2/9 08/17/2017 04/03/2017 01/27/2017  Decreased Interest 2 0 0  Down, Depressed, Hopeless 3 0 0  PHQ - 2 Score 5 0 0  Altered sleeping 3 - -  Tired, decreased energy 3 - -  Change in appetite 2 - -  Feeling bad or failure about yourself  3 - -  Trouble concentrating 0 - -  Moving slowly or fidgety/restless 0 - -  Suicidal thoughts 0 - -  PHQ-9 Score 16 - -   PMHx, SurgHx, SocialHx, FamHx, Medications, and Allergies were reviewed in the Visit Navigator and updated as appropriate.   Patient Active Problem List   Diagnosis Date Noted  . 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 Status post revision of total knee, left 06/09/2017  . Obesity, Class III, BMI 40-49.9 (morbid obesity) ([place]) 12/28/2016  . Anemia 12/16/2016  . AKI (acute kidney injury) ([place])  12/15/2016  . Essential hypertension 12/08/2016  . 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 Knee pain, left 11/11/2016  . Pain in left leg 11/10/2016  . Musculoskeletal neck pain 08/19/2015  . Peripheral neuropathy 01/12/2014  . Hyperglycemia 06/16/2012  . 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 Dyslipidemia 06/16/2012  . Globus sensation 02/04/2012  . Nausea 02/04/2012  . Bowel habit changes 02/04/2012   Social History   Tobacco Use  . Smoking status: Never Smoker  . Smokeless tobacco: Never Used  Substance Use Topics  . Alcohol use: No  . Drug use: No   Current Medications and Allergies:   Current Outpatient Medications:  .  amLODipine (NORVASC) 10 MG tablet, Take 1 tablet (10 mg total) daily by mouth., Disp: 90 tablet, Rfl: 2 .  atenolol (TENORMIN) 50 MG tablet, Take 1 tablet (50 mg total) by mouth daily., Disp: 90 tablet, Rfl: 3 .  Calcium Carbonate-Vitamin D (CALCIUM 600+D) 600-400 MG-UNIT tablet, Take 1 tablet by mouth every other day., Disp: , Rfl:  .  docusate sodium (COLACE) 100 MG capsule, Take 1 capsule (100 mg total) by mouth 2 (two) times daily. (Patient taking differently: Take 100 mg by mouth daily. ), Disp: 10 capsule, Rfl: 0 .  FLUoxetine (PROZAC) 20 MG capsule, Take 1 capsule (20 mg total) by mouth every morning., Disp: 90 capsule, Rfl: 3 .  folic acid (FOLVITE) 716 MCG tablet, Take 800 mcg daily by mouth. ,  Disp: , Rfl:  .  furosemide (LASIX) 40 MG tablet, Take 1 tablet (40 mg total) daily as needed by mouth for edema., Disp: 90 tablet, Rfl: 0 .  gabapentin (NEURONTIN) 300 MG capsule, Take 1 capsule (300 mg total) 2 (two) times daily by mouth., Disp: 180 capsule, Rfl: 1 .  Multiple Vitamin (MULTIVITAMIN WITH MINERALS) TABS tablet, Take 1 tablet by mouth every other day., Disp: , Rfl:  .  promethazine (PHENERGAN) 25 MG tablet, Take 25 mg by mouth every 8 (eight) hours as needed for nausea or vomiting. , Disp: , Rfl: 0 .   rosuvastatin (CRESTOR) 10 MG tablet, Take 1 tablet (10 mg total) daily by mouth., Disp: 90 tablet, Rfl: 0 .  colchicine 0.6 MG tablet, Take 0.5 tablets (0.3 mg total) by mouth daily. (Patient not taking: Reported on 09/22/2017), Disp: 30 tablet, Rfl: 0   Allergies  Allergen Reactions  . Ativan [Lorazepam] Other (See Comments)    Highly irritable  . Haldol [Haloperidol] Other (See Comments)    Vegetative State  . Epinephrine Other (See Comments)    Reaction:  Increased pts BP and caused shoulder to jerk   . Levofloxacin Nausea And Vomiting  . Lipitor [Atorvastatin] Other (See Comments)    Reaction:  Joint pain and dizziness   . Trovan [Alatrofloxacin] Nausea And Vomiting    Pt states that this med caused pancreatitis.   Review of Systems   Pertinent items are noted in the HPI. Otherwise, ROS is negative.  Vitals:   Vitals:   09/22/17 1410  BP: 138/76  Pulse: 63  Temp: 97.7 F (36.5 C)  TempSrc: Oral  SpO2: 98%  Weight: 191 lb 6.4 oz (86.8 kg)  Height: 5\' 6"  (1.676 m)     Body mass index is 30.89 kg/m.  Physical Exam:   Physical Exam  Constitutional: She appears well-developed and well-nourished. No distress.  HENT:  Head: Normocephalic and atraumatic.  Eyes: Pupils are equal, round, and reactive to light. EOM are normal.  Neck: Normal range of motion. Neck supple.  Cardiovascular: Normal rate, regular rhythm, normal heart sounds and intact distal pulses.  Pulmonary/Chest: Effort normal.  Abdominal: Soft.  Musculoskeletal: She exhibits edema.  MCP inflammation. Ulnar deviation. Left 4PIP erythema and edema.  Skin: Skin is warm.  Psychiatric: She has a normal mood and affect. Her behavior is normal.  Nursing note and vitals reviewed.   Results for orders placed or performed in visit on 09/22/17  Comprehensive metabolic panel  Result Value Ref Range   Sodium 139 135 - 145 mEq/L   Potassium 4.2 3.5 - 5.1 mEq/L   Chloride 103 96 - 112 mEq/L   CO2 28 19 - 32 mEq/L    Glucose, Bld 106 (H) 70 - 99 mg/dL   BUN 25 (H) 6 - 23 mg/dL   Creatinine, Ser 1.57 (H) 0.40 - 1.20 mg/dL   Total Bilirubin 0.7 0.2 - 1.2 mg/dL   Alkaline Phosphatase 98 39 - 117 U/L   AST 14 0 - 37 U/L   ALT 10 0 - 35 U/L   Total Protein 7.7 6.0 - 8.3 g/dL   Albumin 4.0 3.5 - 5.2 g/dL   Calcium 9.8 8.4 - 10.5 mg/dL   GFR 33.92 (L) >60.00 mL/min  CBC with Differential/Platelet  Result Value Ref Range   WBC 10.3 4.0 - 10.5 K/uL   RBC 3.94 3.87 - 5.11 Mil/uL   Hemoglobin 11.2 (L) 12.0 - 15.0 g/dL   HCT 33.8 (L) 36.0 -  46.0 %   MCV 85.7 78.0 - 100.0 fl   MCHC 33.1 30.0 - 36.0 g/dL   RDW 15.1 11.5 - 15.5 %   Platelets 344.0 150.0 - 400.0 K/uL   Neutrophils Relative % 67.9 43.0 - 77.0 %   Lymphocytes Relative 21.1 12.0 - 46.0 %   Monocytes Relative 7.5 3.0 - 12.0 %   Eosinophils Relative 2.8 0.0 - 5.0 %   Basophils Relative 0.7 0.0 - 3.0 %   Neutro Abs 7.0 1.4 - 7.7 K/uL   Lymphs Abs 2.2 0.7 - 4.0 K/uL   Monocytes Absolute 0.8 0.1 - 1.0 K/uL   Eosinophils Absolute 0.3 0.0 - 0.7 K/uL   Basophils Absolute 0.1 0.0 - 0.1 K/uL  Uric acid  Result Value Ref Range   Uric Acid, Serum 5.7 2.4 - 7.0 mg/dL  Iron, TIBC and Ferritin Panel  Result Value Ref Range   Iron 31 (L) 45 - 160 mcg/dL   TIBC 270 250 - 450 mcg/dL (calc)   %SAT 11 11 - 50 % (calc)   Ferritin 338 (H) 20 - 288 ng/mL  Vitamin B12  Result Value Ref Range   Vitamin B-12 270 211 - 911 pg/mL  Sedimentation rate  Result Value Ref Range   Sed Rate 58 (H) 0 - 30 mm/hr  C-reactive protein  Result Value Ref Range   CRP 1.6 0.5 - 20.0 mg/dL     Assessment and Plan:   1. Essential hypertension Avoiding excessive salt intake? [x]   YES  []   NO Trying to exercise on a regular basis? []   YES  [x]   NO Review: taking medications as instructed, no medication side effects noted, no TIAs, no chest pain on exertion, no dyspnea on exertion, no swelling of ankles.  Smoker: No.  Wt Readings from Last 3 Encounters:  09/22/17  191 lb 6.4 oz (86.8 kg)  08/17/17 197 lb 6.4 oz (89.5 kg)  06/09/17 202 lb (91.6 kg)   BP Readings from Last 3 Encounters:  09/22/17 138/76  08/17/17 140/88  06/12/17 (!) 110/56   Lab Results  Component Value Date   CREATININE 1.57 (H) 09/22/2017   - Comprehensive metabolic panel  2. AKI (acute kidney injury) ([place])  Lab Results  Component Value Date   CREATININE 1.57 (H) 09/22/2017   CREATININE 1.60 (H) 08/17/2017   CREATININE 1.75 (H) 06/10/2017   - Comprehensive metabolic panel  3. Pain in both hands I suspect that this is inflammatory arthritis. Slow prednisone taper while labs pending. Consider to [place].  - CBC with Differential/Platelet - Uric acid - Sedimentation rate - C-reactive protein - predniSONE (DELTASONE) 5 MG tablet; 6,6,5,5,4,4,3,3,2,2,1,1  Dispense: 42 tablet; Refill: 0  4. Anemia, unspecified type  CBC Latest Ref Rng & Units 09/22/2017 08/17/2017 06/12/2017  WBC 4.0 - 10.5 K/uL 10.3 12.2(H) 11.5(H)  Hemoglobin 12.0 - 15.0 g/dL 11.2(L) 9.8(L) 8.8(L)  Hematocrit 36.0 - 46.0 % 33.8(L) 30.2(L) 28.1(L)  Platelets 150.0 - 400.0 K/uL 344.0 384 176   - CBC with Differential/Platelet - Iron, TIBC and Ferritin Panel - Vitamin B12   . Reviewed expectations re: course of current medical issues. . Discussed self-management of symptoms. . Outlined signs and symptoms indicating need for more acute intervention. . Patient verbalized understanding and all questions were answered. Marland Kitchen Health Maintenance issues including appropriate healthy diet, exercise, and smoking avoidance were discussed with patient. . See orders for this visit as documented in the electronic medical record. . Patient received an After Visit Summary.  CMA served as Education administrator during this visit. History, Physical, and Plan performed by medical provider. The above documentation has been reviewed and is accurate and complete. Briscoe Deutscher, D.O.  Briscoe Deutscher, DO [place] [place]  [facility] 09/25/2017

## 2017-09-23 ENCOUNTER — Other Ambulatory Visit (INDEPENDENT_AMBULATORY_CARE_PROVIDER_SITE_OTHER): Payer: Self-pay

## 2017-09-23 ENCOUNTER — Telehealth: Payer: Self-pay | Admitting: Family Medicine

## 2017-09-23 DIAGNOSIS — L0390 Cellulitis, unspecified: Secondary | ICD-10-CM | POA: Diagnosis not present

## 2017-09-23 DIAGNOSIS — M25562 Pain in left knee: Secondary | ICD-10-CM | POA: Diagnosis not present

## 2017-09-23 DIAGNOSIS — T84093A Other mechanical complication of internal left knee prosthesis, initial encounter: Secondary | ICD-10-CM | POA: Diagnosis not present

## 2017-09-23 LAB — IRON,TIBC AND FERRITIN PANEL
%SAT: 11 % (calc) (ref 11–50)
Ferritin: 338 ng/mL — ABNORMAL HIGH (ref 20–288)
Iron: 31 ug/dL — ABNORMAL LOW (ref 45–160)
TIBC: 270 mcg/dL (calc) (ref 250–450)

## 2017-09-23 MED ORDER — COLCHICINE 0.6 MG PO TABS: 0.3000 mg | ORAL_TABLET | Freq: Every day | ORAL | 1 refills | Status: DC

## 2017-09-23 NOTE — Telephone Encounter (Signed)
Copied from [place] [id number]. Topic: Quick Communication - Lab Results >> Sep 23, 2017  9:16 AM Durwin Glaze, CMA wrote: Called patient to inform them of 08/25/17 lab results. When patient returns call, triage nurse may disclose results. Pt has called back 2 times already.

## 2017-09-23 NOTE — Telephone Encounter (Signed)
Pt was in the shower and missed call. NT not available for 5 min. Pt requesting call back   Copied from [place] [id number]. Topic: Quick Communication - Lab Results >> Sep 23, 2017  9:16 AM Durwin Glaze, CMA wrote: Called patient to inform them of 08/25/17 lab results. When patient returns call, triage nurse may disclose results.

## 2017-09-23 NOTE — Telephone Encounter (Signed)
Pt given lab results and documented in result note.  

## 2017-09-24 NOTE — Telephone Encounter (Signed)
Nurse at [facility] gave lab results.

## 2017-09-25 ENCOUNTER — Telehealth: Payer: Self-pay | Admitting: Family Medicine

## 2017-09-25 ENCOUNTER — Encounter: Payer: Self-pay | Admitting: Family Medicine

## 2017-09-25 DIAGNOSIS — M79642 Pain in left hand: Secondary | ICD-10-CM

## 2017-09-25 DIAGNOSIS — M79641 Pain in right hand: Secondary | ICD-10-CM | POA: Insufficient documentation

## 2017-09-25 NOTE — Telephone Encounter (Signed)
See note.   Copied from [place] [phone]. Topic: General - Call Back - No Documentation >> Sep 25, 2017  3:20 PM Ether Griffins B wrote: Reason for CRM: pt states she had a missed call from the office and was left a message to return the call. I asked her if it could be about lab results but she stated she had already received those.

## 2017-09-28 NOTE — Telephone Encounter (Signed)
See lab note need more information. Will close this CRM so that we can document right

## 2017-10-16 ENCOUNTER — Telehealth: Payer: Self-pay | Admitting: Family Medicine

## 2017-10-16 NOTE — Telephone Encounter (Signed)
See note   Copied from [place] [id number]. Topic: General - Other >> Oct 16, 2017 11:22 AM Neva Seat wrote: Pt has returned missed call and letter received by mail.  Pt has called 3 times to reach [employer]  and hasn't be able to speak to her. She wanted to let Joellen know she received lab results.  To call her back if she is still needed.

## 2017-10-16 NOTE — Telephone Encounter (Signed)
Called patient back hands still doing little better she will call when she gets back from the beach next week to make app for follow up.

## 2017-10-17 IMAGING — DX DG CHEST 1V
1 series · 1 of 1 positions shown · non-contrast
Comparison: CTA of the chest performed 11/18/2016, and chest
radiograph performed 08/25/2012

CLINICAL DATA: Acute onset of shortness of breath. Acute renal
insufficiency. Initial encounter.

EXAM:
CHEST 1 VIEW

[chest ap]
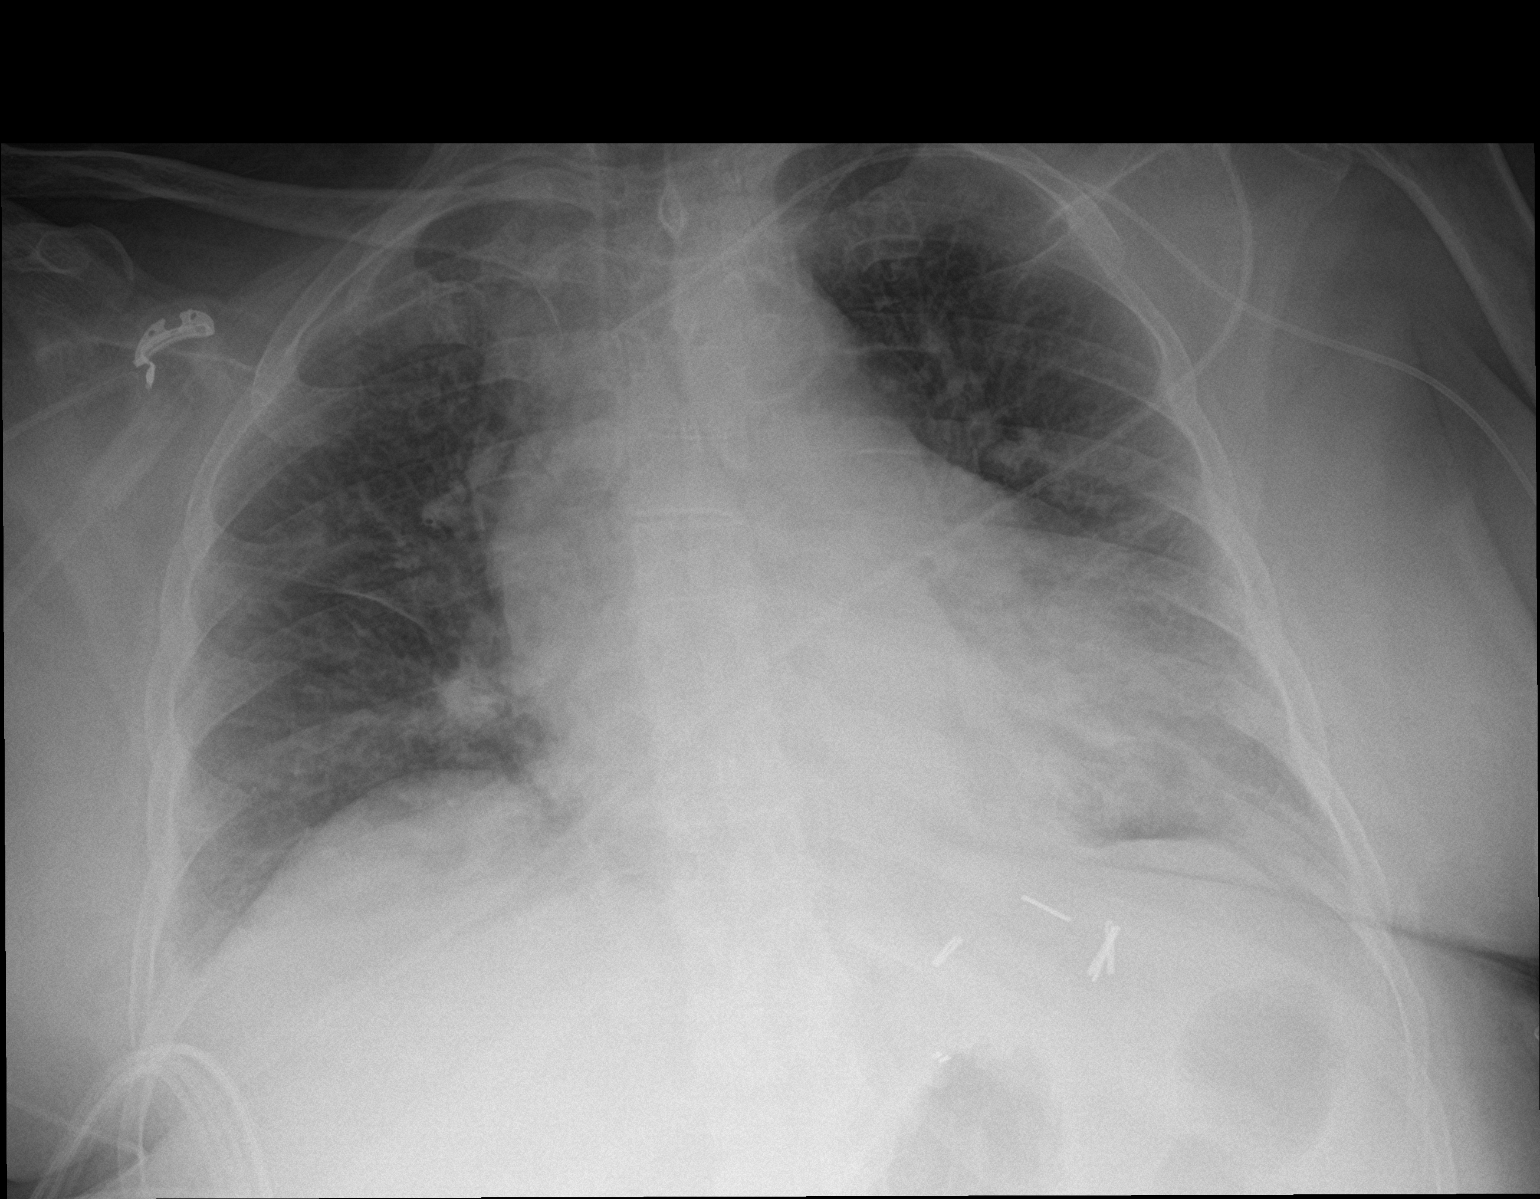

[1 of 1 positions shown; findings below may reference images not displayed]

FINDINGS: The lungs are well-aerated. Mild vascular congestion is noted.
Patchy bilateral airspace opacities, left greater than right, may
reflect pneumonia. The appearance is less typical for asymmetric
interstitial edema. Small bilateral pleural effusions are suggested.
No pneumothorax is seen.

A left PICC is noted ending about the proximal SVC.

The cardiomediastinal silhouette is mildly enlarged. No acute
osseous abnormalities are seen.
IMPRESSION: Mild vascular congestion and mild cardiomegaly. Patchy bilateral
airspace opacities, left greater than right, may reflect pneumonia.
The appearance is less typical for asymmetric interstitial edema.
Small bilateral pleural effusions suggested.

## 2017-10-21 IMAGING — CR DG CHEST 1V PORT
1 series · 1 of 1 positions shown · non-contrast
Comparison: 12/26/2016 and prior exams

CLINICAL DATA: PICC line placement.

EXAM:
PORTABLE CHEST 1 VIEW

[portable]
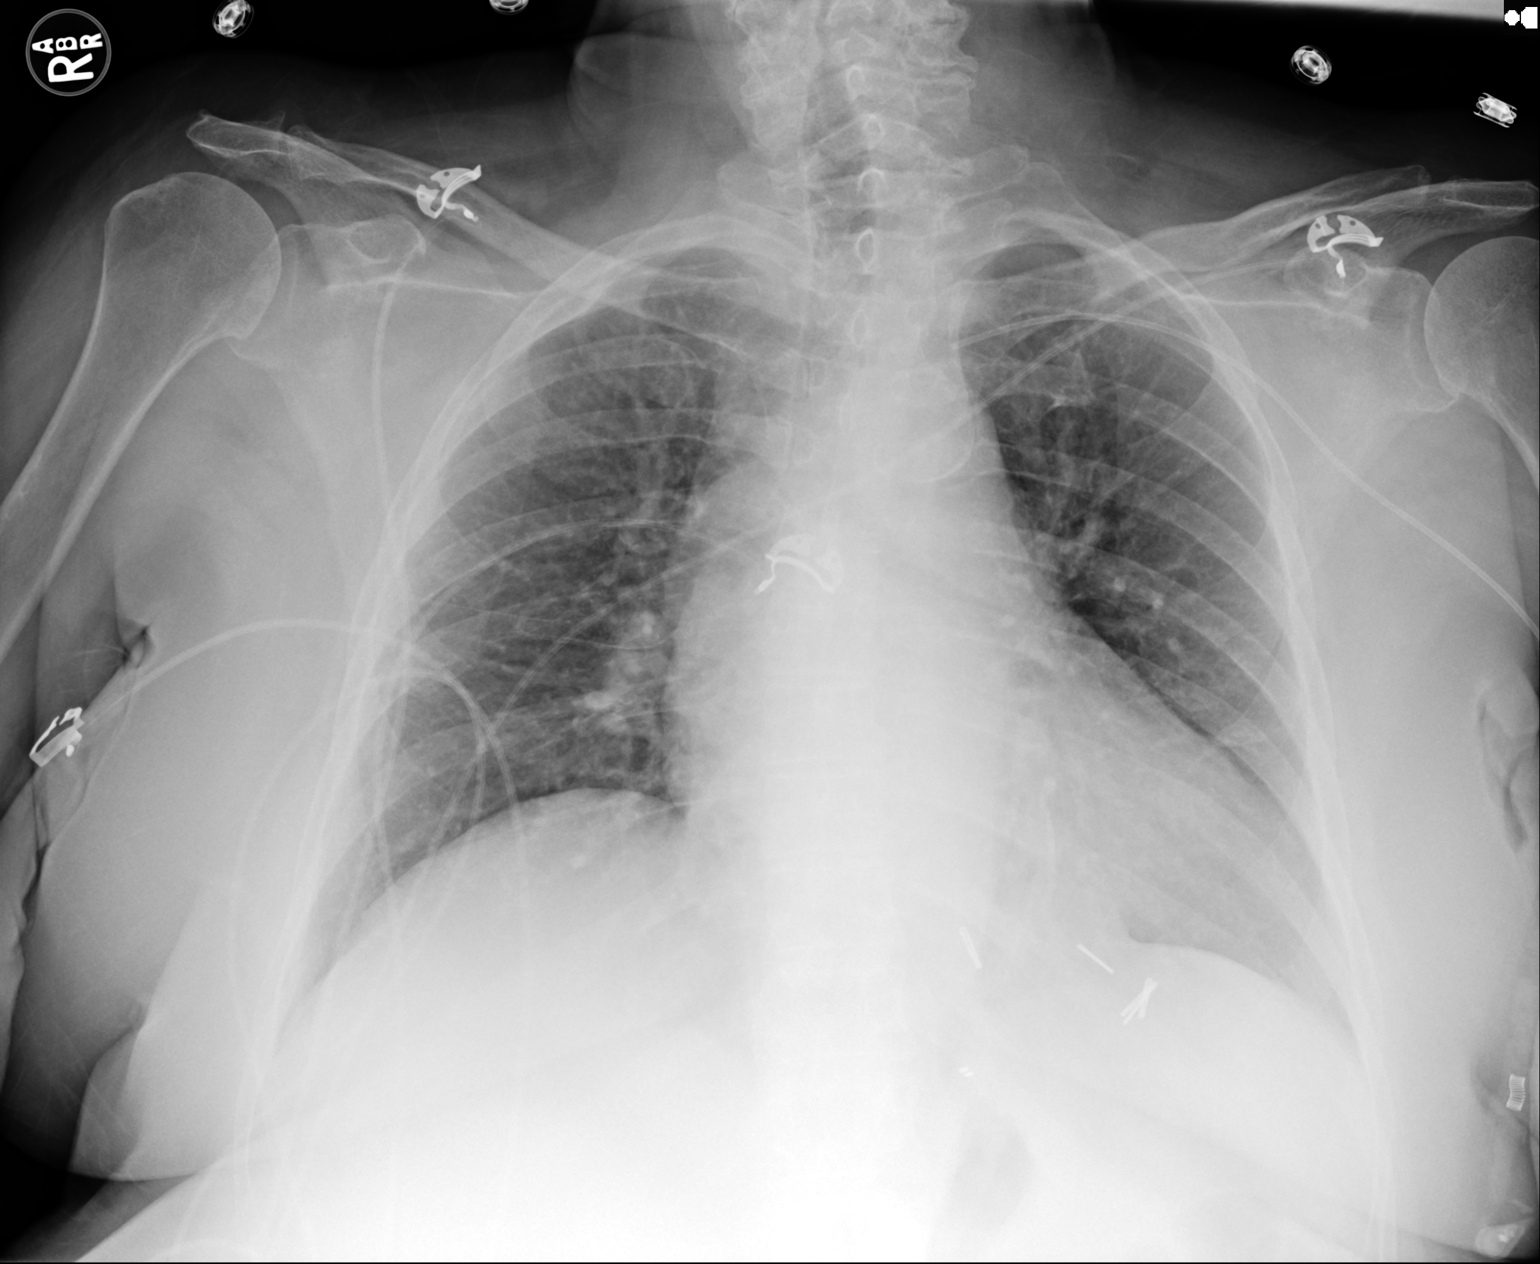

[1 of 1 positions shown; findings below may reference images not displayed]

FINDINGS: Cardiomegaly and pulmonary vascular congestion again noted.

Left basilar atelectasis/ scarring again noted.

A left PICC line has been slightly retracted with tip now overlying
the medial left subclavian vein.

There is no evidence of pneumothorax.
IMPRESSION: Left PICC line slightly retracted now with tip overlying the medial
left subclavian vein.

Slightly decreased pulmonary vascular congestion and improved
bibasilar aeration.

## 2017-10-24 DIAGNOSIS — L0390 Cellulitis, unspecified: Secondary | ICD-10-CM | POA: Diagnosis not present

## 2017-10-24 DIAGNOSIS — T84093A Other mechanical complication of internal left knee prosthesis, initial encounter: Secondary | ICD-10-CM | POA: Diagnosis not present

## 2017-10-24 DIAGNOSIS — M25562 Pain in left knee: Secondary | ICD-10-CM | POA: Diagnosis not present

## 2017-10-30 ENCOUNTER — Other Ambulatory Visit: Payer: Self-pay | Admitting: Family Medicine

## 2017-10-30 ENCOUNTER — Telehealth: Payer: Self-pay | Admitting: Family Medicine

## 2017-10-30 NOTE — Telephone Encounter (Signed)
Is it ok to refill? 

## 2017-10-30 NOTE — Telephone Encounter (Signed)
Copied from [place] [phone]. Topic: Quick Communication - Rx Refill/Question >> Oct 30, 2017 12:45 PM Yvette Rack wrote: Medication: gabapentin (NEURONTIN) 300 MG capsule Has the patient contacted their pharmacy? Yes.  Pharmacy sent over a request today pt will be out of medicine Sunday (Agent: If no, request that the patient contact the pharmacy for the refill.) Preferred Pharmacy (with phone number or street name): [id number] - [place], [place] [phone] (Phone) [phone] (Fax)  Agent: Please be advised that RX refills may take up to 3 business days. We ask that you follow-up with your pharmacy.

## 2017-10-31 NOTE — Telephone Encounter (Signed)
Medication filled on 4/26

## 2017-11-09 ENCOUNTER — Other Ambulatory Visit: Payer: Self-pay | Admitting: Family Medicine

## 2017-11-09 NOTE — Telephone Encounter (Signed)
Please advise on refill.

## 2017-11-20 ENCOUNTER — Ambulatory Visit: Payer: PPO | Admitting: Family Medicine

## 2017-11-23 DIAGNOSIS — L0390 Cellulitis, unspecified: Secondary | ICD-10-CM | POA: Diagnosis not present

## 2017-11-23 DIAGNOSIS — M25562 Pain in left knee: Secondary | ICD-10-CM | POA: Diagnosis not present

## 2017-11-23 DIAGNOSIS — T84093A Other mechanical complication of internal left knee prosthesis, initial encounter: Secondary | ICD-10-CM | POA: Diagnosis not present

## 2017-11-24 ENCOUNTER — Encounter: Payer: Self-pay | Admitting: Family Medicine

## 2017-11-24 ENCOUNTER — Ambulatory Visit (INDEPENDENT_AMBULATORY_CARE_PROVIDER_SITE_OTHER): Payer: PPO | Admitting: Family Medicine

## 2017-11-24 VITALS — BP 132/68 | HR 66 | Temp 98.1°F | Ht 66.0 in | Wt 190.6 lb

## 2017-11-24 DIAGNOSIS — N189 Chronic kidney disease, unspecified: Secondary | ICD-10-CM

## 2017-11-24 DIAGNOSIS — M79641 Pain in right hand: Secondary | ICD-10-CM | POA: Diagnosis not present

## 2017-11-24 DIAGNOSIS — M79642 Pain in left hand: Secondary | ICD-10-CM | POA: Diagnosis not present

## 2017-11-24 DIAGNOSIS — I10 Essential (primary) hypertension: Secondary | ICD-10-CM | POA: Diagnosis not present

## 2017-11-24 DIAGNOSIS — D649 Anemia, unspecified: Secondary | ICD-10-CM | POA: Diagnosis not present

## 2017-11-24 NOTE — Progress Notes (Signed)
Laura Chambers is a 77 y.o. female is here for follow up.  History of Present Illness:   Shaune Pascal CMA acting as scribe for Dr. Juleen China.  HPI: Patient comes in today for her follow up.   Arthrits: Patient stated that she has been having a lot of pain in bilateral hands. She is also having the pain in her shoulders, back and knees. Will place a referral to Rheumatology.   Knee surgery: Patient has had knee surgery 06/2017. She is walking on her own. She is supposed to be using a cane. She did have a fall recently. She laid on the ground for 45 minutes until her son found her. She is still having some pain in the toe and foot but it is improving.  Health Maintenance Due  Topic Date Due  . PNA vac Low Risk Adult (1 of 2 - PCV13) 05/18/2005   Depression screen [facility] 2/9 08/17/2017 04/03/2017 01/27/2017  Decreased Interest 2 0 0  Down, Depressed, Hopeless 3 0 0  PHQ - 2 Score 5 0 0  Altered sleeping 3 - -  Tired, decreased energy 3 - -  Change in appetite 2 - -  Feeling bad or failure about yourself  3 - -  Trouble concentrating 0 - -  Moving slowly or fidgety/restless 0 - -  Suicidal thoughts 0 - -  PHQ-9 Score 16 - -   PMHx, SurgHx, SocialHx, FamHx, Medications, and Allergies were reviewed in the Visit Navigator and updated as appropriate.   Patient Active Problem List   Diagnosis Date Noted  . Pain in both hands 09/25/2017  . 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 Status post revision of total knee, left 06/09/2017  . Obesity, Class III, BMI 40-49.9 (morbid obesity) ([place]) 12/28/2016  . Anemia 12/16/2016  . AKI (acute kidney injury) ([place]) 12/15/2016  . Essential hypertension 12/08/2016  . 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 Knee pain, left 11/11/2016  . Pain in left leg 11/10/2016  . Musculoskeletal neck pain 08/19/2015  . Peripheral neuropathy 01/12/2014  . Hyperglycemia 06/16/2012  . Benign hypertensive heart disease without heart failure  06/16/2012  . Dyslipidemia 06/16/2012  . Globus sensation 02/04/2012  . Nausea 02/04/2012  . Bowel habit changes 02/04/2012   Social History   Tobacco Use  . Smoking status: Never Smoker  . Smokeless tobacco: Never Used  Substance Use Topics  . Alcohol use: No  . Drug use: No   Current Medications and Allergies:   Current Outpatient Medications:  .  amLODipine (NORVASC) 10 MG tablet, Take 1 tablet (10 mg total) daily by mouth., Disp: 90 tablet, Rfl: 2 .  atenolol (TENORMIN) 50 MG tablet, Take 1 tablet (50 mg total) by mouth daily., Disp: 90 tablet, Rfl: 3 .  Calcium Carbonate-Vitamin D (CALCIUM 600+D) 600-400 MG-UNIT tablet, Take 1 tablet by mouth every other day., Disp: , Rfl:  .  colchicine 0.6 MG tablet, Take 0.5 tablets (0.3 mg total) by mouth daily., Disp: 90 tablet, Rfl: 1 .  docusate sodium (COLACE) 100 MG capsule, Take 1 capsule (100 mg total) by mouth 2 (two) times daily. (Patient taking differently: Take 100 mg by mouth daily. ), Disp: 10 capsule, Rfl: 0 .  FLUoxetine (PROZAC) 20 MG capsule, Take 1 capsule (20 mg total) by mouth every morning., Disp: 90 capsule, Rfl: 3 .  folic acid (FOLVITE) 035 MCG tablet, Take 800 mcg daily by mouth. , Disp: , Rfl:  .  furosemide (LASIX) 40 MG  tablet, Take 1 tablet (40 mg total) daily as needed by mouth for edema., Disp: 90 tablet, Rfl: 0 .  gabapentin (NEURONTIN) 300 MG capsule, TAKE 1 CAPSULE BY MOUTH 2 TIMES DAILY, Disp: 180 capsule, Rfl: 1 .  Multiple Vitamin (MULTIVITAMIN WITH MINERALS) TABS tablet, Take 1 tablet by mouth every other day., Disp: , Rfl:  .  predniSONE (DELTASONE) 5 MG tablet, 6,6,5,5,4,4,3,3,2,2,1,1, Disp: 42 tablet, Rfl: 0 .  promethazine (PHENERGAN) 25 MG tablet, Take 25 mg by mouth every 8 (eight) hours as needed for nausea or vomiting. , Disp: , Rfl: 0 .  rosuvastatin (CRESTOR) 10 MG tablet, TAKE 1 TABLET BY MOUTH DAILY IN EVENING, Disp: 90 tablet, Rfl: 0 .  tamsulosin (FLOMAX) 0.4 MG CAPS capsule, Take 0.4 mg by  mouth as needed., Disp: , Rfl:    Allergies  Allergen Reactions  . Ativan [Lorazepam] Other (See Comments)    Highly irritable  . Haldol [Haloperidol] Other (See Comments)    Vegetative State  . Epinephrine Other (See Comments)    Reaction:  Increased pts BP and caused shoulder to jerk   . Levofloxacin Nausea And Vomiting  . Lipitor [Atorvastatin] Other (See Comments)    Reaction:  Joint pain and dizziness   . Trovan [Alatrofloxacin] Nausea And Vomiting    Pt states that this med caused pancreatitis.   Review of Systems   Pertinent items are noted in the HPI. Otherwise, ROS is negative.  Vitals:   Vitals:   11/24/17 1506  BP: 132/68  Pulse: 66  Temp: 98.1 F (36.7 C)  TempSrc: Oral  SpO2: 97%  Weight: 190 lb 9.6 oz (86.5 kg)  Height: 5\' 6"  (1.676 m)     Body mass index is 30.76 kg/m.  Physical Exam:   Physical Exam  Constitutional: She appears well-nourished.  HENT:  Head: Normocephalic and atraumatic.  Eyes: Pupils are equal, round, and reactive to light. EOM are normal.  Neck: Normal range of motion. Neck supple.  Cardiovascular: Normal rate, regular rhythm, normal heart sounds and intact distal pulses.  Pulmonary/Chest: Effort normal.  Abdominal: Soft.  Skin: Skin is warm.  Psychiatric: She has a normal mood and affect. Her behavior is normal.  Nursing note and vitals reviewed.   Assessment and Plan:   Diagnoses and all orders for this visit:  Essential hypertension -     CBC with Differential/Platelet -     Comprehensive metabolic panel  Chronic kidney disease, unspecified CKD stage -     CBC with Differential/Platelet -     Comprehensive metabolic panel  Anemia, unspecified type -     Iron, TIBC and Ferritin Panel  Pain in both hands -     Ambulatory referral to Rheumatology   . Reviewed expectations re: course of current medical issues. . Discussed self-management of symptoms. . Outlined signs and symptoms indicating need for more acute  intervention. . Patient verbalized understanding and all questions were answered. Marland Kitchen Health Maintenance issues including appropriate healthy diet, exercise, and smoking avoidance were discussed with patient. . See orders for this visit as documented in the electronic medical record. . Patient received an After Visit Summary.  Briscoe Deutscher, DO [place] [place] 11/29/2017  Future Appointments  Date Time Provider [place]  02/26/2018  2:00 PM Briscoe Deutscher, DO [facility]  04/08/2018  2:00 PM Williemae Area, RN [facility]  04/16/2018  3:20 PM Briscoe Deutscher, DO [facility]   CMA served as scribe during this visit. History, Physical,  and Plan performed by medical provider. The above documentation has been reviewed and is accurate and complete. Briscoe Deutscher, D.O.

## 2017-11-25 LAB — CBC WITH DIFFERENTIAL/PLATELET
Basophils Absolute: 0 10*3/uL (ref 0.0–0.1)
Basophils Relative: 0.2 % (ref 0.0–3.0)
Eosinophils Absolute: 0.4 10*3/uL (ref 0.0–0.7)
Eosinophils Relative: 4.5 % (ref 0.0–5.0)
HCT: 34.2 % — ABNORMAL LOW (ref 36.0–46.0)
Hemoglobin: 11 g/dL — ABNORMAL LOW (ref 12.0–15.0)
Lymphocytes Relative: 27 % (ref 12.0–46.0)
Lymphs Abs: 2.5 10*3/uL (ref 0.7–4.0)
MCHC: 32.3 g/dL (ref 30.0–36.0)
MCV: 85.3 fl (ref 78.0–100.0)
Monocytes Absolute: 0.9 10*3/uL (ref 0.1–1.0)
Monocytes Relative: 9.6 % (ref 3.0–12.0)
Neutro Abs: 5.4 10*3/uL (ref 1.4–7.7)
Neutrophils Relative %: 58.7 % (ref 43.0–77.0)
Platelets: 313 10*3/uL (ref 150.0–400.0)
RBC: 4 Mil/uL (ref 3.87–5.11)
RDW: 15.8 % — ABNORMAL HIGH (ref 11.5–15.5)
WBC: 9.2 10*3/uL (ref 4.0–10.5)

## 2017-11-25 LAB — COMPREHENSIVE METABOLIC PANEL
ALT: 9 U/L (ref 0–35)
AST: 14 U/L (ref 0–37)
Albumin: 3.7 g/dL (ref 3.5–5.2)
Alkaline Phosphatase: 103 U/L (ref 39–117)
BUN: 21 mg/dL (ref 6–23)
CO2: 30 mEq/L (ref 19–32)
Calcium: 9.9 mg/dL (ref 8.4–10.5)
Chloride: 101 mEq/L (ref 96–112)
Creatinine, Ser: 1.51 mg/dL — ABNORMAL HIGH (ref 0.40–1.20)
GFR: 35.47 mL/min — ABNORMAL LOW (ref >60.00)
Glucose, Bld: 93 mg/dL (ref 70–99)
Potassium: 5.3 mEq/L — ABNORMAL HIGH (ref 3.5–5.1)
Sodium: 139 mEq/L (ref 135–145)
Total Bilirubin: 0.7 mg/dL (ref 0.2–1.2)
Total Protein: 7.4 g/dL (ref 6.0–8.3)

## 2017-11-25 LAB — IRON,TIBC AND FERRITIN PANEL
%SAT: 14 % (calc) (ref 11–50)
Ferritin: 364 ng/mL — ABNORMAL HIGH (ref 20–288)
Iron: 37 ug/dL — ABNORMAL LOW (ref 45–160)
TIBC: 259 mcg/dL (calc) (ref 250–450)

## 2017-11-29 ENCOUNTER — Encounter: Payer: Self-pay | Admitting: Family Medicine

## 2017-12-07 ENCOUNTER — Telehealth: Payer: Self-pay | Admitting: Family Medicine

## 2017-12-07 NOTE — Telephone Encounter (Signed)
See note.   Copied from [place] [id number]. Topic: Referral - Request >> Dec 07, 2017  6:41 AM Lennox Solders wrote: Reason for CRM: pt has seen dr Justine Null rheumatologist in past. Dr Justine Null is deceased. Pt would like to proceed with dr deveshwar for hand pain. Pt would like appt asap.

## 2017-12-08 ENCOUNTER — Other Ambulatory Visit: Payer: Self-pay

## 2017-12-08 DIAGNOSIS — M79641 Pain in right hand: Secondary | ICD-10-CM

## 2017-12-08 DIAGNOSIS — M79642 Pain in left hand: Principal | ICD-10-CM

## 2017-12-08 NOTE — Telephone Encounter (Signed)
Old referral canceled and new one sent

## 2017-12-08 NOTE — Telephone Encounter (Signed)
Called patient gave information also reviewed last lab notes reviewed as well.

## 2017-12-14 NOTE — Progress Notes (Signed)
Office Visit Note  Patient: Laura Chambers             Date of Birth: Jul 19, 1940           MRN: [id number]             PCP: Briscoe Deutscher, DO Referring: Briscoe Deutscher, DO Visit Date: 12/15/2017 Occupation: Retired Glass blower/designer    Subjective:  Pain and swelling in bilateral hands.   History of Present Illness: Laura Chambers is a 77 y.o. female consultation per request of her PCP.  According to patient in February 2019 she woke up one day with right hand swelling and warmth.  She states the swelling was to the point she was having difficulty making a fist and getting dressed.  She was evaluated by Dr. Trevor Mace PA and was given a  prednisone taper which helped.  When she had recurrence of swelling she was given another prednisone taper by her PCP.  She states she responded to both prednisone tapers.  She also had a uric acid test which was within normal limits.  She was given colchicine for 1 week which did not help.  She states her hands swell off and on.  She is currently having swelling in her right hand.  Her right hand is worse than the left hand.  She denies any history of psoriasis or family history of psoriasis.  There is no family history of rheumatoid arthritis or gout.  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  She had left total knee replacement, right total knee replacement, right total hip replacement, and discectomy for lumbar spine disc disease.  Activities of Daily Living:  Patient reports morning stiffness for 30 minutes.   Patient Reports nocturnal pain.  Difficulty dressing/grooming: Reports Difficulty climbing stairs: Reports Difficulty getting out of chair: Reports Difficulty using hands for taps, buttons, cutlery, and/or writing: Reports   Review of Systems  Constitutional: Positive for fever. Negative for fatigue, night sweats, weight gain and weight loss.  HENT: Negative for ear pain, mouth sores, trouble swallowing,  trouble swallowing, mouth dryness and nose dryness.   Eyes: Negative for pain, redness, visual disturbance and dryness.  Respiratory: Negative for cough, shortness of breath and difficulty breathing.   Cardiovascular: Positive for swelling in legs/feet. Negative for chest pain, palpitations, hypertension and irregular heartbeat.  Gastrointestinal: Negative for blood in stool, constipation and diarrhea.  Endocrine: Negative for increased urination.  Genitourinary: Negative for difficulty urinating and vaginal dryness.  Musculoskeletal: Positive for arthralgias, joint pain, joint swelling, myalgias, morning stiffness and myalgias. Negative for muscle weakness and muscle tenderness.  Skin: Positive for rash. Negative for color change, hair loss, skin tightness, ulcers and sensitivity to sunlight.       Stasis dermatitis  Allergic/Immunologic: Negative for susceptible to infections.  Neurological: Positive for numbness and weakness. Negative for dizziness, memory loss and night sweats.       Bilateral feet due to peripheral neuropathy.  Hematological: Negative for bruising/bleeding tendency and swollen glands.  Psychiatric/Behavioral: Positive for depressed mood and sleep disturbance. The patient is not nervous/anxious.     PMFS History:  Patient Active Problem List   Diagnosis Date Noted  . Hx of peripheral neuropathy 12/15/2017  . Hx of colonic polyps 12/15/2017  . History of depression 12/15/2017  . History of anxiety 12/15/2017  . 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 Chronic kidney disease (CKD), active medical management without dialysis, stage 3 (moderate) ([place]) 12/15/2017  . Fibromyalgia  12/15/2017  . History of total right hip replacement 12/15/2017  . DDD (degenerative disc disease), lumbar 12/15/2017  . 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 Left foot drop 12/15/2017  . Pain in both hands 09/25/2017  . 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 Status post revision  of total knee, left 06/09/2017  . Obesity, Class III, BMI 40-49.9 (morbid obesity) ([place]) 12/28/2016  . Anemia 12/16/2016  . AKI (acute kidney injury) ([place]) 12/15/2016  . Essential hypertension 12/08/2016  . 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 Knee pain, left 11/11/2016  . Pain in left leg 11/10/2016  . Musculoskeletal neck pain 08/19/2015  . Peripheral neuropathy 01/12/2014  . Hyperglycemia 06/16/2012  . 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 Dyslipidemia 06/16/2012  . Globus sensation 02/04/2012  . Nausea 02/04/2012  . Bowel habit changes 02/04/2012    Past Medical History:  Diagnosis Date  . Anemia   . Anxiety   . Arthritis   . Atelectasis, left    left lower lung  . Blood transfusion without reported diagnosis   . Complication of anesthesia 2012   "irritated trachea" with cough x 1 year from intubation 2012.  smaller tube used with other surgeries  . Depression    just lost her son a month ago  . Esophageal hernia   . Fibromyalgia   . Headache    occasionally  . HTN (hypertension)   . Hx of colonic polyps   . Hydronephrosis of right kidney   . Hypercholesterolemia   . Neuropathy due to medical condition ([place])    bilateral lower legs  . Obesity   . 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 Osteoporosis   . Renal angiomyolipoma     Family History  Problem Relation Age of Onset  . Diabetes Father   . Osteoarthritis Father   . Heart disease Father   . Stroke Father   . Osteoarthritis Mother   . Stroke Mother   . Pneumonia Mother   . Colon cancer Sister 76  . Colon polyps Unknown        neice/nephew  . Diabetes Sister   . Diabetes Brother    Past Surgical History:  Procedure Laterality Date  . ABDOMINAL HYSTERECTOMY  1969  . BACK SURGERY  2012   rod in back-  lumbar disc removal with bone graft  . blephoroplasty     bilateral  . BREAST BIOPSY  1989  . CHOLECYSTECTOMY  2010  .  EXCISIONAL TOTAL KNEE ARTHROPLASTY WITH ANTIBIOTIC SPACERS Left 11/14/2016   Procedure: EXCISIONAL LEFT TOTAL KNEE ARTHROPLASTY WITH  PLACEMENT OF ANTIBIOTIC SPACERS;  Surgeon: [name], MD;  Location: [facility];  Service: Orthopedics;  Laterality: Left;  . NISSEN FUNDOPLICATION    . PARAESOPHAGEAL HERNIA REPAIR  1989  . REVISION TOTAL KNEE ARTHROPLASTY Left 06/09/2017  . TONSILLECTOMY  1946  . TOTAL HIP ARTHROPLASTY Right 08/31/2012   Procedure: TOTAL HIP ARTHROPLASTY ANTERIOR APPROACH;  Surgeon: Mauri Pole, MD;  Location: [facility];  Service: Orthopedics;  Laterality: Right;  . TOTAL KNEE ARTHROPLASTY  2001   left  . TOTAL KNEE ARTHROPLASTY  2010   right  . TOTAL KNEE REVISION Left 06/09/2017   Procedure: LEFT REVISION KNEE ARTHROPLASTY;  Surgeon: [name], MD;  Location: [facility];  Service: Orthopedics;  Laterality: Left;   Social History   Social History Narrative   Patient lives at home alone.   Caffeine  Use: 1 cup daily     Objective: Vital Signs: BP 114/69 (BP Location: Left Arm, Patient Position: Sitting, Cuff Size: Normal)   Pulse 71   Ht '5\' 6"'  (1.676 m)   Wt 184 lb (83.5 kg)   BMI 29.70 kg/m    Physical Exam  Constitutional: She is oriented to person, place, and time. She appears well-developed and well-nourished.  HENT:  Head: Normocephalic and atraumatic.  Eyes: Conjunctivae and EOM are normal.  Neck: Normal range of motion.  Cardiovascular: Normal rate, regular rhythm, normal heart sounds and intact distal pulses.  Pulmonary/Chest: Effort normal and breath sounds normal.  Abdominal: Soft. Bowel sounds are normal.  Musculoskeletal: She exhibits edema.  Pedal edema  Lymphadenopathy:    She has no cervical adenopathy.  Neurological: She is alert and oriented to person, place, and time.  Left foot drop  Skin: Skin is warm and dry. Capillary refill takes less than 2 seconds.  Psychiatric: She has a normal mood and affect. Her behavior is  normal.  Nursing note and vitals reviewed.    Musculoskeletal Exam: C-spine thoracic lumbar spine good range of motion.  Shoulder joints elbow joints were in good range of motion.  She has limited range of motion of her right wrist joint with some swelling.  Swelling was noted over some of the PIPs as described below.  She has incomplete fist formation bilaterally.  Hip joints have limited range of motion.  She has right total hip replacement.  She has bilateral total knee replacements.  She has discomfort range of motion of lumbar spine.  CDAI Exam: CDAI Homunculus Exam:   Tenderness:  RUE: wrist Right hand: 5th PIP Left hand: 2nd PIP and 4th PIP  Swelling:  RUE: wrist Right hand: 5th PIP Left hand: 2nd PIP and 4th PIP  Joint Counts:  CDAI Tender Joint count: 4 CDAI Swollen Joint count: 4  Global Assessments:  Patient Global Assessment: 5 Provider Global Assessment: 5  CDAI Calculated Score: 18    Investigation: Findings:  09/22/17: uric acid 5.7, Sed rate 58, CRP 1.6   Component     Latest Ref Rng & Units 09/22/2017  Sed Rate     0 - 30 mm/hr 58 (H)  CRP     0.5 - 20.0 mg/dL 1.6   CBC Latest Ref Rng & Units 11/24/2017 09/22/2017 08/17/2017  WBC 4.0 - 10.5 K/uL 9.2 10.3 12.2(H)  Hemoglobin 12.0 - 15.0 g/dL 11.0(L) 11.2(L) 9.8(L)  Hematocrit 36.0 - 46.0 % 34.2(L) 33.8(L) 30.2(L)  Platelets 150.0 - 400.0 K/uL 313.0 344.0 384   CMP Latest Ref Rng & Units 11/24/2017 09/22/2017 08/17/2017  Glucose 70 - 99 mg/dL 93 106(H) 90  BUN 6 - 23 mg/dL 21 25(H) 49(H)  Creatinine 0.40 - 1.20 mg/dL 1.51(H) 1.57(H) 1.60(H)  Sodium 135 - 145 mEq/L 139 139 141  Potassium 3.5 - 5.1 mEq/L 5.3(H) 4.2 4.8  Chloride 96 - 112 mEq/L 101 103 106  CO2 19 - 32 mEq/L '30 28 25  ' Calcium 8.4 - 10.5 mg/dL 9.9 9.8 9.8  Total Protein 6.0 - 8.3 g/dL 7.4 7.7 7.5  Total Bilirubin 0.2 - 1.2 mg/dL 0.7 0.7 0.6  Alkaline Phos 39 - 117 U/L 103 98 86  AST 0 - 37 U/L '14 14 21  ' ALT 0 - 35 U/L '9 10 20      ' Imaging: Xr Hand 2 View Left  Result Date: 12/15/2017 [facility] PIP and DIP narrowing was noted.  Soft tissue swelling was noted  in the second and fourth PIP joint.  No erosive changes were noted.  No intercarpal, radiocarpal or MCP joint narrowing was noted.    Speciality Comments: No specialty comments available.    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  She has had good response to 2 tapers of prednisone in the past.  I will obtain following labs today.  Plan: XR Hand 2 View Left, Uric acid, HLA-B27 antigen, Rheumatoid factor, Cyclic citrul peptide antibody, IgG, 14-3-3 eta Protein, Sedimentation rate, ANA, HLA-B27 antigen, Glucose 6 phosphate dehydrogenase.  At this point I would not initiate any therapy.  Have given her a list of natural anti-inflammatories which she can try.  History of total right hip replacement - 2014 Dr. [name].  Doing well.  Status post revision of total knee, left - December 2018 by Dr. Ninfa Linden.  According to patient the left knee had to be revised due to sepsis.  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 Maureen Ralphs.  Doing well  DDD (degenerative disc disease), lumbar - Status post discectomy by Dr. Trenton Gammon 2012  Left foot drop-states she developed left foot drop after the knee surgery.  Hx of peripheral neuropathy  Chronic kidney disease (CKD), active medical management without dialysis, stage 3 (moderate) ([facility]) - GFR 35.  She recalls having renal insufficiency after the sepsis.  Other medical problems are listed as follows:  Essential  hypertension  History of anxiety  History of depression  Hx of colonic polyps  Renal angiomyolipoma  Fibromyalgia  Dyslipidemia    Orders: Orders Placed This Encounter  Procedures  . XR Hand 2 View Left  . Uric acid  . HLA-B27 antigen  . Rheumatoid factor  . Cyclic citrul peptide antibody, IgG  . 14-3-3 eta Protein  . Sedimentation rate  . ANA  . HLA-B27 antigen  . Glucose 6 phosphate dehydrogenase   No orders of the defined types were placed in this encounter.   Face-to-face time spent with patient was 50 minutes. >50% of time was spent in counseling and coordination of care.  Follow-Up Instructions: Return for Osteoarthritis, inflammatory arthritis.   Bo Merino, MD  Note - This record has been created using Editor, commissioning.  Chart creation errors have been sought, but may not always  have been located. Such creation errors do not reflect on  the standard of medical care.

## 2017-12-15 ENCOUNTER — Encounter: Payer: Self-pay | Admitting: Rheumatology

## 2017-12-15 ENCOUNTER — Ambulatory Visit (INDEPENDENT_AMBULATORY_CARE_PROVIDER_SITE_OTHER): Payer: PPO

## 2017-12-15 ENCOUNTER — Ambulatory Visit: Payer: PPO | Admitting: Rheumatology

## 2017-12-15 VITALS — BP 114/69 | HR 71 | Ht 66.0 in | Wt 184.0 lb

## 2017-12-15 DIAGNOSIS — Z8669 Personal history of other diseases of the nervous system and sense organs: Secondary | ICD-10-CM

## 2017-12-15 DIAGNOSIS — E785 Hyperlipidemia, unspecified: Secondary | ICD-10-CM

## 2017-12-15 DIAGNOSIS — M21372 Foot drop, left foot: Secondary | ICD-10-CM | POA: Diagnosis not present

## 2017-12-15 DIAGNOSIS — I10 Essential (primary) hypertension: Secondary | ICD-10-CM | POA: Diagnosis not present

## 2017-12-15 DIAGNOSIS — Z96651 Presence of right artificial knee joint: Secondary | ICD-10-CM

## 2017-12-15 DIAGNOSIS — M5136 Other intervertebral disc degeneration, lumbar region: Secondary | ICD-10-CM

## 2017-12-15 DIAGNOSIS — M797 Fibromyalgia: Secondary | ICD-10-CM

## 2017-12-15 DIAGNOSIS — N183 Chronic kidney disease, stage 3 unspecified: Secondary | ICD-10-CM | POA: Insufficient documentation

## 2017-12-15 DIAGNOSIS — T8459XS Infection and inflammatory reaction due to other internal joint prosthesis, sequela: Secondary | ICD-10-CM

## 2017-12-15 DIAGNOSIS — M51369 Other intervertebral disc degeneration, lumbar region without mention of lumbar back pain or lower extremity pain: Secondary | ICD-10-CM | POA: Insufficient documentation

## 2017-12-15 DIAGNOSIS — D1771 Benign lipomatous neoplasm of kidney: Secondary | ICD-10-CM

## 2017-12-15 DIAGNOSIS — Z96641 Presence of right artificial hip joint: Secondary | ICD-10-CM | POA: Diagnosis not present

## 2017-12-15 DIAGNOSIS — Z96659 Presence of unspecified artificial knee joint: Secondary | ICD-10-CM

## 2017-12-15 DIAGNOSIS — Z860100 Personal history of colon polyps, unspecified: Secondary | ICD-10-CM | POA: Insufficient documentation

## 2017-12-15 DIAGNOSIS — M79641 Pain in right hand: Secondary | ICD-10-CM

## 2017-12-15 DIAGNOSIS — M79642 Pain in left hand: Secondary | ICD-10-CM

## 2017-12-15 DIAGNOSIS — Z8659 Personal history of other mental and behavioral disorders: Secondary | ICD-10-CM | POA: Diagnosis not present

## 2017-12-15 DIAGNOSIS — Z96652 Presence of left artificial knee joint: Secondary | ICD-10-CM | POA: Diagnosis not present

## 2017-12-15 NOTE — Patient Instructions (Signed)
Natural anti-inflammatories  You can purchase these at [employer], [employer] or online.  . Turmeric (capsules)  . Ginger (ginger root or capsules)  . Omega 3 (Fish, flax seeds, chia seeds, walnuts, almonds)  . Tart cherry (dried or extract)   Patient should be under the care of a physician while taking these supplements. This may not be reproduced without the permission of Dr. Ariellah Faust.  

## 2017-12-16 DIAGNOSIS — M79641 Pain in right hand: Secondary | ICD-10-CM | POA: Diagnosis not present

## 2017-12-16 DIAGNOSIS — M79642 Pain in left hand: Secondary | ICD-10-CM | POA: Diagnosis not present

## 2017-12-17 NOTE — Progress Notes (Signed)
Labs c/w Ra. May sch earlier appt.

## 2017-12-19 LAB — SEDIMENTATION RATE: SED RATE: 75 mm/h — AB (ref 0–30)

## 2017-12-19 LAB — ANA: Anti Nuclear Antibody(ANA): NEGATIVE

## 2017-12-19 LAB — CYCLIC CITRUL PEPTIDE ANTIBODY, IGG

## 2017-12-19 LAB — GLUCOSE 6 PHOSPHATE DEHYDROGENASE: G-6PDH: 16.4 U/g Hgb (ref 7.0–20.5)

## 2017-12-19 LAB — RHEUMATOID FACTOR: Rhuematoid fact SerPl-aCnc: 93 IU/mL — ABNORMAL HIGH (ref <14)

## 2017-12-19 LAB — HLA-B27 ANTIGEN: HLA-B27 ANTIGEN: NEGATIVE

## 2017-12-19 LAB — 14-3-3 ETA PROTEIN: 14-3-3 eta Protein: <0.2 ng/mL (ref <0.2)

## 2017-12-19 LAB — URIC ACID: URIC ACID, SERUM: 5.9 mg/dL (ref 2.5–7.0)

## 2017-12-21 ENCOUNTER — Telehealth: Payer: Self-pay | Admitting: Rheumatology

## 2017-12-21 NOTE — Telephone Encounter (Signed)
Patient called stating she was returning your call regarding her labwork.   °

## 2017-12-21 NOTE — Progress Notes (Signed)
Office Visit Note  Patient: Laura Chambers             Date of Birth: 1941/03/08           MRN: [id number]             PCP: Briscoe Deutscher, DO Referring: Briscoe Deutscher, DO Visit Date: 12/23/2017 Occupation: '@GUAROCC'$ @    Subjective:  Pain and swelling in hands.   History of Present Illness: Laura Chambers is a 77 y.o. female with seropositive rheumatoid arthritis and osteoarthritis.  She states she has been taking some natural anti-inflammatories which are helpful.  She still continues to have some pain and swelling in her hands and wrist joints.  She also has discomfort in her knee joints from prior knee replacements.  Activities of Daily Living:  Patient reports morning stiffness for 10 minutes.   Patient Denies nocturnal pain.  Difficulty dressing/grooming: Denies Difficulty climbing stairs: Reports Difficulty getting out of chair: Reports Difficulty using hands for taps, buttons, cutlery, and/or writing: Reports   Review of Systems  Constitutional: Positive for fatigue. Negative for night sweats, weight gain and weight loss.  HENT: Negative for mouth sores, trouble swallowing, trouble swallowing, mouth dryness and nose dryness.   Eyes: Negative for pain, redness, visual disturbance and dryness.  Respiratory: Negative for cough, shortness of breath and difficulty breathing.   Cardiovascular: Negative for chest pain, palpitations, hypertension, irregular heartbeat and swelling in legs/feet.  Gastrointestinal: Negative for blood in stool, constipation and diarrhea.  Endocrine: Negative for increased urination.  Genitourinary: Negative for vaginal dryness.  Musculoskeletal: Positive for arthralgias, joint pain, joint swelling and morning stiffness. Negative for myalgias, muscle weakness, muscle tenderness and myalgias.  Skin: Negative for color change, rash, hair loss, skin tightness, ulcers and sensitivity to sunlight.  Allergic/Immunologic: Negative for susceptible to  infections.  Neurological: Negative for dizziness, memory loss, night sweats and weakness.  Hematological: Negative for swollen glands.  Psychiatric/Behavioral: Positive for depressed mood. Negative for sleep disturbance. The patient is not nervous/anxious.     PMFS History:  Patient Active Problem List   Diagnosis Date Noted  . Hx of peripheral neuropathy 12/15/2017  . Hx of colonic polyps 12/15/2017  . History of depression 12/15/2017  . History of anxiety 12/15/2017  . 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 Chronic kidney disease (CKD), active medical management without dialysis, stage 3 (moderate) ([place]) 12/15/2017  . Fibromyalgia 12/15/2017  . History of total right hip replacement 12/15/2017  . DDD (degenerative disc disease), lumbar 12/15/2017  . 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 Left foot drop 12/15/2017  . Pain in both hands 09/25/2017  . 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 Status post revision of total knee, left 06/09/2017  . Obesity, Class III, BMI 40-49.9 (morbid obesity) ([place]) 12/28/2016  . Anemia 12/16/2016  . AKI (acute kidney injury) ([facility]) 12/15/2016  . Essential hypertension 12/08/2016  . 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 Knee pain, left 11/11/2016  . Pain in left leg 11/10/2016  . Musculoskeletal neck pain 08/19/2015  . Peripheral neuropathy 01/12/2014  . Hyperglycemia 06/16/2012  . 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 Dyslipidemia 06/16/2012  . Globus sensation 02/04/2012  . Nausea 02/04/2012  . Bowel habit changes 02/04/2012    Past Medical History:  Diagnosis Date  . Anemia   . Anxiety   . Arthritis   . Atelectasis, left    left lower lung  . Blood transfusion without reported diagnosis   .  Complication of anesthesia 2012   "irritated trachea" with cough x 1 year from intubation 2012.  smaller tube used with other surgeries  . Depression    just lost her son a month ago    . Esophageal hernia   . Fibromyalgia   . Headache    occasionally  . HTN (hypertension)   . Hx of colonic polyps   . Hydronephrosis of right kidney   . Hypercholesterolemia   . Neuropathy due to medical condition ([place])    bilateral lower legs  . Obesity   . 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 Osteoporosis   . Renal angiomyolipoma     Family History  Problem Relation Age of Onset  . Diabetes Father   . Osteoarthritis Father   . Heart disease Father   . Stroke Father   . Osteoarthritis Mother   . Stroke Mother   . Pneumonia Mother   . Colon cancer Sister 63  . Colon polyps Unknown        neice/nephew  . Diabetes Sister   . Diabetes Brother    Past Surgical History:  Procedure Laterality Date  . ABDOMINAL HYSTERECTOMY  1969  . BACK SURGERY  2012   rod in back-  lumbar disc removal with bone graft  . blephoroplasty     bilateral  . BREAST BIOPSY  1989  . CHOLECYSTECTOMY  2010  . EXCISIONAL TOTAL KNEE ARTHROPLASTY WITH ANTIBIOTIC SPACERS Left 11/14/2016   Procedure: EXCISIONAL LEFT TOTAL KNEE ARTHROPLASTY WITH  PLACEMENT OF ANTIBIOTIC SPACERS;  Surgeon: [name], MD;  Location: [facility];  Service: Orthopedics;  Laterality: Left;  . NISSEN FUNDOPLICATION    . PARAESOPHAGEAL HERNIA REPAIR  1989  . REVISION TOTAL KNEE ARTHROPLASTY Left 06/09/2017  . TONSILLECTOMY  1946  . TOTAL HIP ARTHROPLASTY Right 08/31/2012   Procedure: TOTAL HIP ARTHROPLASTY ANTERIOR APPROACH;  Surgeon: Mauri Pole, MD;  Location: [facility];  Service: Orthopedics;  Laterality: Right;  . TOTAL KNEE ARTHROPLASTY  2001   left  . TOTAL KNEE ARTHROPLASTY  2010   right  . TOTAL KNEE REVISION Left 06/09/2017   Procedure: LEFT REVISION KNEE ARTHROPLASTY;  Surgeon: [name], MD;  Location: [facility];  Service: Orthopedics;  Laterality: Left;   Social History   Social History Narrative   Patient lives at home alone.   Caffeine Use: 1 cup daily      Objective: Vital Signs: BP 124/71 (BP Location: Left Arm, Patient Position: Sitting, Cuff Size: Small)   Pulse 71   Resp 14   Ht '5\' 6"'$  (1.676 m)   Wt 194 lb (88 kg)   BMI 31.31 kg/m    Physical Exam  Constitutional: She is oriented to person, place, and time. She appears well-developed and well-nourished.  HENT:  Head: Normocephalic and atraumatic.  Eyes: Conjunctivae and EOM are normal.  Neck: Normal range of motion.  Cardiovascular: Normal rate, regular rhythm, normal heart sounds and intact distal pulses.  Pulmonary/Chest: Effort normal and breath sounds normal.  Abdominal: Soft. Bowel sounds are normal.  Lymphadenopathy:    She has no cervical adenopathy.  Neurological: She is alert and oriented to person, place, and time.  Skin: Skin is warm and dry. Capillary refill takes less than 2 seconds.  Psychiatric: She has a normal mood and affect. Her behavior is normal.  Nursing note and vitals reviewed.    Musculoskeletal Exam: C-spine thoracic lumbar spine good range of motion.  Shoulder  joints elbow joints were in good range of motion.  She has synovitis of her wrist joint ,MCP joints and PIP joints as described below.  She had discomfort with range of motion of her hip joints and knee joints which have been replaced.  No synovitis was noted over her ankles or MTPs.  CDAI Exam: CDAI Homunculus Exam:   Tenderness:  RUE: wrist Right hand: 1st MCP Left hand: 2nd PIP and 4th PIP  Swelling:  RUE: wrist Right hand: 1st MCP Left hand: 2nd PIP and 4th PIP  Joint Counts:  CDAI Tender Joint count: 4 CDAI Swollen Joint count: 4  Global Assessments:  Patient Global Assessment: 5 Provider Global Assessment: 5  CDAI Calculated Score: 18   Investigation: No additional findings. December 16, 2017 uric acid 5.9, HLA-B27 negative, ANA negative, ESR 75, RF 93, anti-CCP greater than 250, '14 3 3 '$ at a negative, G6PD normal  Imaging: Xr Hand 2 View Left  Result Date:  12/15/2017 CMC PIP and DIP narrowing was noted.  Soft tissue swelling was noted in the second and fourth PIP joint.  No erosive changes were noted.  No intercarpal, radiocarpal or MCP joint narrowing was noted.    Speciality Comments: No specialty comments available.    Procedures:  No procedures performed Allergies: Ativan [lorazepam]; Haldol [haloperidol]; Epinephrine; Levofloxacin; Lipitor [atorvastatin]; and Trovan [alatrofloxacin]   Assessment / Plan:     Visit Diagnoses: Rheumatoid arthritis involving multiple sites with positive rheumatoid factor (HCC) - Positive RF, positive anti-CCP, elevated ESR, positive synovitis on examination.  Patient has ongoing synovitis in her bilateral hands.  We had detailed discussion regarding rheumatoid arthritis and the treatment options.  Indications side effects contraindications were discussed at length.  Patient is concerned about immunosuppression because of previous history of sepsis and infection.  After reviewing indications side effects of Plaquenil she was willing to proceed with that.  Handout was given and consent was taken.  The plan is to start her on Plaquenil 200 mg p.o. daily.  I will use a lower dose because of decreased GFR.  High risk medication use - G6PD normal.  We will check labs in 1 month and then every 3 months to monitor for drug toxicity.  She will also need baseline eye exam in that eye exam every year.  Patient states that she is up-to-date on her immunization which include pneumococcal vaccine and Shingrix vaccine.  History of total right hip replacement-chronic discomfort  S/P total knee replacement, right-chronic discomfort  Status post revision of total knee, left  Infected prosthetic knee joint, sequela  DDD (degenerative disc disease), lumbar-she continues to have some lower back pain.  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  Fibromyalgia-she does have some generalized pain from fibromyalgia.  Chronic  kidney disease (CKD), active medical management without dialysis, stage 3 (moderate) (HCC) - GFR 35  Dyslipidemia  Essential hypertension  History of anxiety  History of depression  Hx of peripheral neuropathy    Orders: No orders of the defined types were placed in this encounter.  Meds ordered this encounter  Medications  . hydroxychloroquine (PLAQUENIL) 200 MG tablet    Sig: Take 1 tablet (200 mg total) by mouth daily.    Dispense:  30 tablet    Refill:  1    Face-to-face time spent with patient was 35 minutes. >50% of time was spent in counseling and coordination of care.  Follow-Up Instructions: Return in about 3 months (around 03/25/2018) for Rheumatoid arthritis.   Abel Presto  Estanislado Pandy, MD  Note - This record has been created using Editor, commissioning.  Chart creation errors have been sought, but may not always  have been located. Such creation errors do not reflect on  the standard of medical care.

## 2017-12-21 NOTE — Telephone Encounter (Signed)
Patient has been schedule a new patient follow up for 12/23/17 at 1:15 pm.

## 2017-12-22 NOTE — Telephone Encounter (Signed)
error 

## 2017-12-23 ENCOUNTER — Ambulatory Visit: Payer: PPO | Admitting: Rheumatology

## 2017-12-23 ENCOUNTER — Encounter: Payer: Self-pay | Admitting: Rheumatology

## 2017-12-23 VITALS — BP 124/71 | HR 71 | Resp 14 | Ht 66.0 in | Wt 194.0 lb

## 2017-12-23 DIAGNOSIS — E785 Hyperlipidemia, unspecified: Secondary | ICD-10-CM

## 2017-12-23 DIAGNOSIS — M0579 Rheumatoid arthritis with rheumatoid factor of multiple sites without organ or systems involvement: Secondary | ICD-10-CM

## 2017-12-23 DIAGNOSIS — T8459XS Infection and inflammatory reaction due to other internal joint prosthesis, sequela: Secondary | ICD-10-CM | POA: Diagnosis not present

## 2017-12-23 DIAGNOSIS — Z8659 Personal history of other mental and behavioral disorders: Secondary | ICD-10-CM

## 2017-12-23 DIAGNOSIS — N183 Chronic kidney disease, stage 3 unspecified: Secondary | ICD-10-CM

## 2017-12-23 DIAGNOSIS — Z96641 Presence of right artificial hip joint: Secondary | ICD-10-CM

## 2017-12-23 DIAGNOSIS — Z79899 Other long term (current) drug therapy: Secondary | ICD-10-CM

## 2017-12-23 DIAGNOSIS — I10 Essential (primary) hypertension: Secondary | ICD-10-CM

## 2017-12-23 DIAGNOSIS — M797 Fibromyalgia: Secondary | ICD-10-CM

## 2017-12-23 DIAGNOSIS — M5136 Other intervertebral disc degeneration, lumbar region: Secondary | ICD-10-CM | POA: Diagnosis not present

## 2017-12-23 DIAGNOSIS — Z96652 Presence of left artificial knee joint: Secondary | ICD-10-CM

## 2017-12-23 DIAGNOSIS — Z8669 Personal history of other diseases of the nervous system and sense organs: Secondary | ICD-10-CM

## 2017-12-23 DIAGNOSIS — Z96651 Presence of right artificial knee joint: Secondary | ICD-10-CM

## 2017-12-23 DIAGNOSIS — M21372 Foot drop, left foot: Secondary | ICD-10-CM | POA: Diagnosis not present

## 2017-12-23 DIAGNOSIS — Z96659 Presence of unspecified artificial knee joint: Secondary | ICD-10-CM

## 2017-12-23 MED ORDER — HYDROXYCHLOROQUINE SULFATE 200 MG PO TABS: 200.0000 mg | ORAL_TABLET | Freq: Every day | ORAL | 1 refills | Status: DC

## 2017-12-23 NOTE — Patient Instructions (Signed)
Standing Labs We placed an order today for your standing lab work.    Please come back and get your standing labs in 1 month and every 3 months  We have open lab Monday through Friday from 8:30-11:30 AM and 1:30-4:00 PM  at the office of Dr. Bo Merino.   You may experience shorter wait times on Monday and Friday afternoons. The office is located at [address] [place], [place], [place] [address] No appointment is necessary.   Labs are drawn by [facility].  You may receive a bill from [facility] for your lab work. If you have any questions regarding directions or hours of operation,  please call [phone].      Hydroxychloroquine tablets What is this medicine? HYDROXYCHLOROQUINE (hye drox ee KLOR oh kwin) is used to treat rheumatoid arthritis and systemic lupus erythematosus. It is also used to treat malaria. This medicine may be used for other purposes; ask your health care provider or pharmacist if you have questions. COMMON BRAND NAME(S): Plaquenil, Quineprox What should I tell my health care provider before I take this medicine? They need to know if you have any of these conditions: -diabetes -eye disease, vision problems -G6PD deficiency -history of blood diseases -history of irregular heartbeat -if you often drink alcohol -kidney disease -liver disease -porphyria -psoriasis -seizures -an unusual or allergic reaction to chloroquine, hydroxychloroquine, other medicines, foods, dyes, or preservatives -pregnant or trying to get pregnant -breast-feeding How should I use this medicine? Take this medicine by mouth with a glass of water. Follow the directions on the prescription label. Avoid taking antacids within 4 hours of taking this medicine. It is best to separate these medicines by at least 4 hours. Do not cut, crush or chew this medicine. You can take it with or without food. If it upsets your stomach, take it with food. Take your medicine at regular intervals. Do  not take your medicine more often than directed. Take all of your medicine as directed even if you think you are better. Do not skip doses or stop your medicine early. Talk to your pediatrician regarding the use of this medicine in children. While this drug may be prescribed for selected conditions, precautions do apply. Overdosage: If you think you have taken too much of this medicine contact a poison control center or emergency room at once. NOTE: This medicine is only for you. Do not share this medicine with others. What if I miss a dose? If you miss a dose, take it as soon as you can. If it is almost time for your next dose, take only that dose. Do not take double or extra doses. What may interact with this medicine? Do not take this medicine with any of the following medications: -cisapride -dofetilide -dronedarone -live virus vaccines -penicillamine -pimozide -thioridazine -ziprasidone This medicine may also interact with the following medications: -ampicillin -antacids -cimetidine -cyclosporine -digoxin -medicines for diabetes, like insulin, glipizide, glyburide -medicines for seizures like carbamazepine, phenobarbital, phenytoin -mefloquine -methotrexate -other medicines that prolong the QT interval (cause an abnormal heart rhythm) -praziquantel This list may not describe all possible interactions. Give your health care provider a list of all the medicines, herbs, non-prescription drugs, or dietary supplements you use. Also tell them if you smoke, drink alcohol, or use illegal drugs. Some items may interact with your medicine. What should I watch for while using this medicine? Tell your doctor or healthcare professional if your symptoms do not start to get better or if they get worse. Avoid  taking antacids within 4 hours of taking this medicine. It is best to separate these medicines by at least 4 hours. Tell your doctor or health care professional right away if you have any  change in your eyesight. Your vision and blood may be tested before and during use of this medicine. This medicine can make you more sensitive to the sun. Keep out of the sun. If you cannot avoid being in the sun, wear protective clothing and use sunscreen. Do not use sun lamps or tanning beds/booths. What side effects may I notice from receiving this medicine? Side effects that you should report to your doctor or health care professional as soon as possible: -allergic reactions like skin rash, itching or hives, swelling of the face, lips, or tongue -changes in vision -decreased hearing or ringing of the ears -redness, blistering, peeling or loosening of the skin, including inside the mouth -seizures -sensitivity to light -signs and symptoms of a dangerous change in heartbeat or heart rhythm like chest pain; dizziness; fast or irregular heartbeat; palpitations; feeling faint or lightheaded, falls; breathing problems -signs and symptoms of liver injury like dark yellow or brown urine; general ill feeling or flu-like symptoms; light-colored stools; loss of appetite; nausea; right upper belly pain; unusually weak or tired; yellowing of the eyes or skin -signs and symptoms of low blood sugar such as feeling anxious; confusion; dizziness; increased hunger; unusually weak or tired; sweating; shakiness; cold; irritable; headache; blurred vision; fast heartbeat; loss of consciousness -uncontrollable head, mouth, neck, arm, or leg movements Side effects that usually do not require medical attention (report to your doctor or health care professional if they continue or are bothersome): -anxious -diarrhea -dizziness -hair loss -headache -irritable -loss of appetite -nausea, vomiting -stomach pain This list may not describe all possible side effects. Call your doctor for medical advice about side effects. You may report side effects to FDA at 1-800-FDA-1088. Where should I keep my medicine? Keep out  of the reach of children. In children, this medicine can cause overdose with small doses. Store at room temperature between 15 and 30 degrees C (59 and 86 degrees F). Protect from moisture and light. Throw away any unused medicine after the expiration date. NOTE: This sheet is a summary. It may not cover all possible information. If you have questions about this medicine, talk to your doctor, pharmacist, or health care provider.  2018 Elsevier/Gold Standard (2016-02-06 14:16:15)

## 2017-12-24 DIAGNOSIS — M25562 Pain in left knee: Secondary | ICD-10-CM | POA: Diagnosis not present

## 2017-12-24 DIAGNOSIS — T84093A Other mechanical complication of internal left knee prosthesis, initial encounter: Secondary | ICD-10-CM | POA: Diagnosis not present

## 2017-12-24 DIAGNOSIS — L0390 Cellulitis, unspecified: Secondary | ICD-10-CM | POA: Diagnosis not present

## 2017-12-25 ENCOUNTER — Telehealth: Payer: Self-pay | Admitting: Rheumatology

## 2017-12-25 NOTE — Telephone Encounter (Signed)
Patient states she took her PLQ for the first time yesterday afternoon at about 2 pm. Patient states about 45 minutes after she took the medicine she starting having diarrhea and having stomach cramps. Patient states she ate 30 minutes after taking the medcaition. Patient states she was also having stomach cramping and diarrhea about 3 more times. Patient states she was unable to go to sleep until 1 am this more. She does not feel she will be able to take the medication. Please.

## 2017-12-25 NOTE — Telephone Encounter (Signed)
Patient calling to let you know she took first Plaquenil yesterday. Patient experienced extreme nausea, stomach cramps, diarrhea, and a headache. Patient does not want to take next dose without she talks to you. Please call to advise.

## 2017-12-27 NOTE — Telephone Encounter (Signed)
Please, notify patient to discontinue PLQ. She is concerned about strong immunosuppressive agent due to h/o sepsis. Please, sch an appointment to discuss SSZ. Patient may read about Ss, it will be next best choice for her.

## 2017-12-28 NOTE — Telephone Encounter (Signed)
Patient states her sister has been sick with a stomach virus with the same symptoms. patient thinks she may have had the same virus. Patient states she is going to retry the PLQ. And if she has the same reaction then she will call the office for an appointment.

## 2017-12-30 ENCOUNTER — Ambulatory Visit: Payer: PPO | Admitting: Rheumatology

## 2018-01-04 ENCOUNTER — Telehealth: Payer: Self-pay | Admitting: Family Medicine

## 2018-01-04 NOTE — Telephone Encounter (Signed)
See note

## 2018-01-04 NOTE — Telephone Encounter (Signed)
Patient not due for refill yet.

## 2018-01-04 NOTE — Telephone Encounter (Unsigned)
Copied from [place] [id number]. Topic: Quick Communication - Rx Refill/Question >> Jan 04, 2018 11:08 AM Judyann Munson wrote: Medication: gabapentin (NEURONTIN) 300 MG   Has the patient contacted their pharmacy? No    Preferred Pharmacy (with phone number or street name): [id number] - [place], [place] [address] [place] [address] Phone: [phone] Fax: [phone]    Agent: Please be advised that RX refills may take up to 3 business days. We ask that you follow-up with your pharmacy.

## 2018-01-05 ENCOUNTER — Telehealth: Payer: Self-pay

## 2018-01-05 ENCOUNTER — Other Ambulatory Visit: Payer: Self-pay

## 2018-01-05 MED ORDER — GABAPENTIN 300 MG PO CAPS: 300.0000 mg | ORAL_CAPSULE | Freq: Three times a day (TID) | ORAL | 1 refills | Status: DC

## 2018-01-05 NOTE — Telephone Encounter (Signed)
Okay to send 

## 2018-01-05 NOTE — Telephone Encounter (Signed)
Received fax from pharmacy needs updated script on gabapentin 300mg  1 tid ok to send.

## 2018-01-05 NOTE — Telephone Encounter (Signed)
Sent script in

## 2018-01-12 ENCOUNTER — Telehealth: Payer: Self-pay | Admitting: Rheumatology

## 2018-01-12 NOTE — Telephone Encounter (Signed)
Patient states she has her grandson for the month and she is going to hold off on trying the PLQ again. Patient wanted to make [place] aware. Patient is using CBD oil capsules and states she is feeling better and able to open bottle and jars.

## 2018-01-12 NOTE — Telephone Encounter (Signed)
Patient left a message wanting you to call her.  CB#[phone].  Thank you.

## 2018-01-14 ENCOUNTER — Other Ambulatory Visit: Payer: Self-pay | Admitting: Rheumatology

## 2018-01-23 DIAGNOSIS — L0390 Cellulitis, unspecified: Secondary | ICD-10-CM | POA: Diagnosis not present

## 2018-01-23 DIAGNOSIS — M25562 Pain in left knee: Secondary | ICD-10-CM | POA: Diagnosis not present

## 2018-01-23 DIAGNOSIS — T84093A Other mechanical complication of internal left knee prosthesis, initial encounter: Secondary | ICD-10-CM | POA: Diagnosis not present

## 2018-01-25 ENCOUNTER — Ambulatory Visit: Payer: PPO | Admitting: Rheumatology

## 2018-02-10 ENCOUNTER — Ambulatory Visit: Payer: PPO | Admitting: Rheumatology

## 2018-02-15 ENCOUNTER — Other Ambulatory Visit: Payer: Self-pay | Admitting: Family Medicine

## 2018-02-26 ENCOUNTER — Ambulatory Visit (INDEPENDENT_AMBULATORY_CARE_PROVIDER_SITE_OTHER): Payer: PPO | Admitting: Family Medicine

## 2018-02-26 ENCOUNTER — Encounter: Payer: Self-pay | Admitting: Family Medicine

## 2018-02-26 VITALS — BP 130/72 | HR 68 | Temp 98.3°F | Ht 66.0 in | Wt 191.8 lb

## 2018-02-26 DIAGNOSIS — I10 Essential (primary) hypertension: Secondary | ICD-10-CM | POA: Diagnosis not present

## 2018-02-26 DIAGNOSIS — F329 Major depressive disorder, single episode, unspecified: Secondary | ICD-10-CM

## 2018-02-26 DIAGNOSIS — E785 Hyperlipidemia, unspecified: Secondary | ICD-10-CM

## 2018-02-26 DIAGNOSIS — G6289 Other specified polyneuropathies: Secondary | ICD-10-CM

## 2018-02-26 DIAGNOSIS — Z23 Encounter for immunization: Secondary | ICD-10-CM | POA: Diagnosis not present

## 2018-02-26 DIAGNOSIS — Z1211 Encounter for screening for malignant neoplasm of colon: Secondary | ICD-10-CM

## 2018-02-26 LAB — CBC WITH DIFFERENTIAL/PLATELET
Basophils Absolute: 0.1 10*3/uL (ref 0.0–0.1)
Basophils Relative: 0.6 % (ref 0.0–3.0)
Eosinophils Absolute: 0.3 10*3/uL (ref 0.0–0.7)
Eosinophils Relative: 3.2 % (ref 0.0–5.0)
HCT: 34.8 % — ABNORMAL LOW (ref 36.0–46.0)
Hemoglobin: 11.4 g/dL — ABNORMAL LOW (ref 12.0–15.0)
Lymphocytes Relative: 19.3 % (ref 12.0–46.0)
Lymphs Abs: 2 10*3/uL (ref 0.7–4.0)
MCHC: 32.7 g/dL (ref 30.0–36.0)
MCV: 84.7 fl (ref 78.0–100.0)
Monocytes Absolute: 0.8 10*3/uL (ref 0.1–1.0)
Monocytes Relative: 8.2 % (ref 3.0–12.0)
Neutro Abs: 6.9 10*3/uL (ref 1.4–7.7)
Neutrophils Relative %: 68.7 % (ref 43.0–77.0)
Platelets: 308 10*3/uL (ref 150.0–400.0)
RBC: 4.11 Mil/uL (ref 3.87–5.11)
RDW: 15.6 % — ABNORMAL HIGH (ref 11.5–15.5)
WBC: 10.1 10*3/uL (ref 4.0–10.5)

## 2018-02-26 LAB — COMPREHENSIVE METABOLIC PANEL
ALT: 10 U/L (ref 0–35)
AST: 17 U/L (ref 0–37)
Albumin: 4 g/dL (ref 3.5–5.2)
Alkaline Phosphatase: 93 U/L (ref 39–117)
BUN: 27 mg/dL — ABNORMAL HIGH (ref 6–23)
CO2: 29 mEq/L (ref 19–32)
Calcium: 10 mg/dL (ref 8.4–10.5)
Chloride: 101 mEq/L (ref 96–112)
Creatinine, Ser: 1.51 mg/dL — ABNORMAL HIGH (ref 0.40–1.20)
GFR: 35.44 mL/min — ABNORMAL LOW (ref >60.00)
Glucose, Bld: 105 mg/dL — ABNORMAL HIGH (ref 70–99)
Potassium: 4.4 mEq/L (ref 3.5–5.1)
Sodium: 136 mEq/L (ref 135–145)
Total Bilirubin: 0.8 mg/dL (ref 0.2–1.2)
Total Protein: 8.3 g/dL (ref 6.0–8.3)

## 2018-02-26 MED ORDER — ZOSTER VAC RECOMB ADJUVANTED 50 MCG/0.5ML IM SUSR: 0.5000 mL | Freq: Once | INTRAMUSCULAR | 1 refills | Status: AC

## 2018-02-26 MED ORDER — GABAPENTIN 300 MG PO CAPS: 300.0000 mg | ORAL_CAPSULE | Freq: Three times a day (TID) | ORAL | 2 refills | Status: DC

## 2018-02-26 MED ORDER — ATENOLOL 50 MG PO TABS: 50.0000 mg | ORAL_TABLET | Freq: Every day | ORAL | 3 refills | Status: DC

## 2018-02-26 MED ORDER — ROSUVASTATIN CALCIUM 10 MG PO TABS
ORAL_TABLET | ORAL | 0 refills | Status: DC
Start: 2018-02-26 — End: 2018-05-24

## 2018-02-26 MED ORDER — FLUOXETINE HCL 20 MG PO CAPS: 20.0000 mg | ORAL_CAPSULE | Freq: Every morning | ORAL | 3 refills | Status: DC

## 2018-02-26 MED ORDER — PROMETHAZINE HCL 25 MG PO TABS: 25.0000 mg | ORAL_TABLET | Freq: Three times a day (TID) | ORAL | 0 refills | Status: DC | PRN

## 2018-02-26 MED ORDER — AMLODIPINE BESYLATE 10 MG PO TABS: 10.0000 mg | ORAL_TABLET | Freq: Every day | ORAL | 2 refills | Status: DC

## 2018-02-26 NOTE — Progress Notes (Signed)
Laura Chambers is a 77 y.o. female is here for follow up.  History of Present Illness:   HPI: Doing well. Recent confirmation of RA but did not tolerate Plaquenil. She has been using CBD capsules and feels that it has been helpful. Energy is improving. She admits to not hydrating well.   Review of Systems  Constitutional: Negative for chills, fever and malaise/fatigue.  Respiratory: Negative for cough.   Cardiovascular: Negative for chest pain.  Gastrointestinal: Negative for constipation, diarrhea, nausea and vomiting.  Genitourinary: Negative for dysuria.  Musculoskeletal: Positive for joint pain. Negative for falls.  Skin: Negative for rash.  Neurological: Negative for dizziness.   Health Maintenance Due  Topic Date Due  . INFLUENZA VACCINE  02/04/2018   Depression screen [facility] 2/9 08/17/2017 04/03/2017 01/27/2017  Decreased Interest 2 0 0  Down, Depressed, Hopeless 3 0 0  PHQ - 2 Score 5 0 0  Altered sleeping 3 - -  Tired, decreased energy 3 - -  Change in appetite 2 - -  Feeling bad or failure about yourself  3 - -  Trouble concentrating 0 - -  Moving slowly or fidgety/restless 0 - -  Suicidal thoughts 0 - -  PHQ-9 Score 16 - -   PMHx, SurgHx, SocialHx, FamHx, Medications, and Allergies were reviewed in the Visit Navigator and updated as appropriate.   Patient Active Problem List   Diagnosis Date Noted  . Hx of peripheral neuropathy 12/15/2017  . Hx of colonic polyps 12/15/2017  . History of depression 12/15/2017  . History of anxiety 12/15/2017  . 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 Chronic kidney disease (CKD), active medical management without dialysis, stage 3 (moderate) ([place]) 12/15/2017  . Fibromyalgia 12/15/2017  . History of total right hip replacement 12/15/2017  . DDD (degenerative disc disease), lumbar 12/15/2017  . 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 Left foot drop 12/15/2017  . Pain in both hands 09/25/2017  . Reactive depression  (situational) 08/17/2017  . Status post revision of total knee, left 06/09/2017  . Obesity, Class III, BMI 40-49.9 (morbid obesity) ([place]) 12/28/2016  . Anemia 12/16/2016  . AKI (acute kidney injury) ([place]) 12/15/2016  . Essential hypertension 12/08/2016  . 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 Knee pain, left 11/11/2016  . Pain in left leg 11/10/2016  . Musculoskeletal neck pain 08/19/2015  . Peripheral neuropathy 01/12/2014  . Hyperglycemia 06/16/2012  . 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 Dyslipidemia 06/16/2012  . Globus sensation 02/04/2012  . Nausea 02/04/2012  . Bowel habit changes 02/04/2012   Social History   Tobacco Use  . Smoking status: Never Smoker  . Smokeless tobacco: Never Used  Substance Use Topics  . Alcohol use: No  . Drug use: No   Current Medications and Allergies:   .  amLODipine (NORVASC) 10 MG tablet, Take 1 tablet (10 mg total) daily by mouth., Disp: 90 tablet, Rfl: 2 .  atenolol (TENORMIN) 50 MG tablet, Take 1 tablet (50 mg total) by mouth daily., Disp: 90 tablet, Rfl: 3 .  Calcium Carbonate-Vitamin D (CALCIUM 600+D) 600-400 MG-UNIT tablet, Take 1 tablet by mouth every other day., Disp: , Rfl:  .  colchicine 0.6 MG tablet, Take 0.5 tablets (0.3 mg total) by mouth daily. (Patient not taking: Reported on 12/23/2017), Disp: 90 tablet, Rfl: 1 .  docusate sodium (COLACE) 100 MG capsule, Take 1 capsule (100 mg total) by mouth 2 (two) times daily. (Patient not taking: Reported  on 12/23/2017), Disp: 10 capsule, Rfl: 0 .  ferrous sulfate 325 (65 FE) MG EC tablet, Take 325 mg by mouth 3 (three) times daily with meals., Disp: , Rfl:  .  FLUoxetine (PROZAC) 20 MG capsule, Take 1 capsule (20 mg total) by mouth every morning., Disp: 90 capsule, Rfl: 3 .  folic acid (FOLVITE) 397 MCG tablet, Take 800 mcg daily by mouth. , Disp: , Rfl:  .  furosemide (LASIX) 40 MG tablet, Take 1 tablet (40 mg total) daily as needed by mouth  for edema. (Patient not taking: Reported on 12/23/2017), Disp: 90 tablet, Rfl: 0 .  gabapentin (NEURONTIN) 300 MG capsule, Take 1 capsule (300 mg total) by mouth 3 (three) times daily., Disp: 270 capsule, Rfl: 1 .  hydroxychloroquine (PLAQUENIL) 200 MG tablet, Take 1 tablet (200 mg total) by mouth daily., Disp: 30 tablet, Rfl: 1 .  Misc Natural Products (TART CHERRY ADVANCED PO), Take by mouth., Disp: , Rfl:  .  Multiple Vitamin (MULTIVITAMIN WITH MINERALS) TABS tablet, Take 1 tablet by mouth every other day., Disp: , Rfl:  .  Omega-3 1000 MG CAPS, Take by mouth., Disp: , Rfl:  .  promethazine (PHENERGAN) 25 MG tablet, Take 25 mg by mouth every 8 (eight) hours as needed for nausea or vomiting. , Disp: , Rfl: 0 .  rosuvastatin (CRESTOR) 10 MG tablet, TAKE 1 TABLET BY MOUTH DAILY IN EVENING, Disp: 90 tablet, Rfl: 0 .  tamsulosin (FLOMAX) 0.4 MG CAPS capsule, Take 0.4 mg by mouth as needed., Disp: , Rfl:  .  Turmeric 400 MG CAPS, Take by mouth., Disp: , Rfl:    Allergies  Allergen Reactions  . Ativan [Lorazepam] Other (See Comments)    Highly irritable  . Haldol [Haloperidol] Other (See Comments)    Vegetative State  . Epinephrine Other (See Comments)    Reaction:  Increased pts BP and caused shoulder to jerk   . Levofloxacin Nausea And Vomiting  . Lipitor [Atorvastatin] Other (See Comments)    Reaction:  Joint pain and dizziness   . Trovan [Alatrofloxacin] Nausea And Vomiting    Pt states that this med caused pancreatitis.   Review of Systems   Pertinent items are noted in the HPI. Otherwise, ROS is negative.  Vitals:   Vitals:   02/26/18 1401  BP: 130/72  Pulse: 68  Temp: 98.3 F (36.8 C)  TempSrc: Oral  SpO2: 95%  Weight: 191 lb 12.8 oz (87 kg)  Height: 5\' 6"  (1.676 m)     Body mass index is 30.96 kg/m.  Physical Exam:   Physical Exam  Constitutional: She appears well-nourished.  HENT:  Head: Normocephalic and atraumatic.  Eyes: Pupils are equal, round, and reactive  to light. EOM are normal.  Neck: Normal range of motion. Neck supple.  Cardiovascular: Normal rate, regular rhythm, normal heart sounds and intact distal pulses.  Pulmonary/Chest: Effort normal.  Abdominal: Soft.  Skin: Skin is warm.  Psychiatric: She has a normal mood and affect. Her behavior is normal.  Nursing note and vitals reviewed.  Assessment and Plan:   Galadriel was seen today for follow-up.  Diagnoses and all orders for this visit:  Reactive depression (situational) -     FLUoxetine (PROZAC) 20 MG capsule; Take 1 capsule (20 mg total) by mouth every morning.  Need for shingles vaccine -     Zoster Vaccine Adjuvanted [facility]) injection; Inject 0.5 mLs into the muscle once for 1 dose.  Essential hypertension -  amLODipine (NORVASC) 10 MG tablet; Take 1 tablet (10 mg total) by mouth daily. -     atenolol (TENORMIN) 50 MG tablet; Take 1 tablet (50 mg total) by mouth daily. -     CBC with Differential/Platelet -     Comprehensive metabolic panel  Need for vaccination against Streptococcus pneumoniae using pneumococcal conjugate vaccine 13 -     Pneumococcal conjugate vaccine 13-valent  Other polyneuropathy -     gabapentin (NEURONTIN) 300 MG capsule; Take 1 capsule (300 mg total) by mouth 3 (three) times daily.  Dyslipidemia -     rosuvastatin (CRESTOR) 10 MG tablet; TAKE 1 TABLET BY MOUTH DAILY IN EVENING  . Reviewed expectations re: course of current medical issues. . Discussed self-management of symptoms. . Outlined signs and symptoms indicating need for more acute intervention. . Patient verbalized understanding and all questions were answered. Marland Kitchen Health Maintenance issues including appropriate healthy diet, exercise, and smoking avoidance were discussed with patient. . See orders for this visit as documented in the electronic medical record. . Patient received an After Visit Summary.  Briscoe Deutscher, DO [place], [facility] 02/27/2018

## 2018-02-27 ENCOUNTER — Encounter: Payer: Self-pay | Admitting: Family Medicine

## 2018-03-11 NOTE — Progress Notes (Signed)
Office Visit Note  Patient: Laura Chambers             Date of Birth: 02-13-1941           MRN: [id number]             PCP: Briscoe Deutscher, DO Referring: Briscoe Deutscher, DO Visit Date: 03/25/2018 Occupation: '@GUAROCC' @  Subjective:  Pain in joints.   History of Present Illness: NANCYLEE GAINES is a 77 y.o. female history of seropositive rheumatoid arthritis and osteoarthritis.  She states she tried Plaquenil after the last visit.  She developed gastritis, abdominal discomfort and diarrhea.  She states her sister had GI symptoms at the same time.  She is uncertain if it was a side effect of Plaquenil although she had a stomach virus.  She stopped the Plaquenil and tried CBD oil for some time.  She states the CBD oil caused palpitations and she stopped the medication.  She continues to have some pain and swelling in her hands.  She also has discomfort in other joints.  Activities of Daily Living:  Patient reports morning stiffness for 15 minutes.   Patient Denies nocturnal pain.  Difficulty dressing/grooming: Denies Difficulty climbing stairs: Reports Difficulty getting out of chair: Reports Difficulty using hands for taps, buttons, cutlery, and/or writing: Reports  Review of Systems  Constitutional: Positive for fatigue. Negative for night sweats, weight gain and weight loss.  HENT: Negative for mouth sores, trouble swallowing, trouble swallowing, mouth dryness and nose dryness.   Eyes: Positive for dryness. Negative for pain, redness and visual disturbance.  Respiratory: Negative for cough, shortness of breath and difficulty breathing.   Cardiovascular: Positive for hypertension. Negative for chest pain, palpitations, irregular heartbeat and swelling in legs/feet.  Gastrointestinal: Negative for blood in stool, constipation and diarrhea.  Endocrine: Negative for increased urination.  Genitourinary: Negative for vaginal dryness.  Musculoskeletal: Positive for joint swelling and  morning stiffness. Negative for arthralgias, joint pain, myalgias, muscle weakness, muscle tenderness and myalgias.  Skin: Negative for color change, rash, hair loss, skin tightness, ulcers and sensitivity to sunlight.  Allergic/Immunologic: Negative for susceptible to infections.  Neurological: Negative for dizziness, memory loss, night sweats and weakness.  Hematological: Negative for swollen glands.  Psychiatric/Behavioral: Positive for sleep disturbance. Negative for depressed mood. The patient is nervous/anxious.     PMFS History:  Patient Active Problem List   Diagnosis Date Noted  . Hx of peripheral neuropathy 12/15/2017  . Hx of colonic polyps 12/15/2017  . History of depression 12/15/2017  . History of anxiety 12/15/2017  . 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 Chronic kidney disease (CKD), active medical management without dialysis, stage 3 (moderate) ([place]) 12/15/2017  . Fibromyalgia 12/15/2017  . History of total right hip replacement 12/15/2017  . DDD (degenerative disc disease), lumbar 12/15/2017  . 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 Left foot drop 12/15/2017  . Pain in both hands 09/25/2017  . 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 Status post revision of total knee, left 06/09/2017  . Obesity, Class III, BMI 40-49.9 (morbid obesity) ([place]) 12/28/2016  . Anemia 12/16/2016  . AKI (acute kidney injury) ([place]) 12/15/2016  . Essential hypertension 12/08/2016  . 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 Knee pain, left 11/11/2016  . Pain in left leg 11/10/2016  . Musculoskeletal neck pain 08/19/2015  . Peripheral neuropathy 01/12/2014  . Hyperglycemia 06/16/2012  . 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 Dyslipidemia 06/16/2012  . Globus sensation  02/04/2012  . Nausea 02/04/2012  . Bowel habit changes 02/04/2012    Past Medical History:  Diagnosis Date  . Anemia   . Anxiety   . Arthritis   . Atelectasis,  left    left lower lung  . Blood transfusion without reported diagnosis   . Complication of anesthesia 2012   "irritated trachea" with cough x 1 year from intubation 2012.  smaller tube used with other surgeries  . Depression    just lost her son a month ago  . Esophageal hernia   . Fibromyalgia   . Headache    occasionally  . HTN (hypertension)   . Hx of colonic polyps   . Hydronephrosis of right kidney   . Hypercholesterolemia   . Neuropathy due to medical condition ([place])    bilateral lower legs  . Obesity   . 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 Osteoporosis   . Renal angiomyolipoma     Family History  Problem Relation Age of Onset  . Diabetes Father   . Osteoarthritis Father   . Heart disease Father   . Stroke Father   . Osteoarthritis Mother   . Stroke Mother   . Pneumonia Mother   . Colon cancer Sister 22  . Colon polyps Unknown        neice/nephew  . Diabetes Sister   . Diabetes Brother    Past Surgical History:  Procedure Laterality Date  . ABDOMINAL HYSTERECTOMY  1969  . BACK SURGERY  2012   rod in back-  lumbar disc removal with bone graft  . blephoroplasty     bilateral  . BREAST BIOPSY  1989  . CHOLECYSTECTOMY  2010  . EXCISIONAL TOTAL KNEE ARTHROPLASTY WITH ANTIBIOTIC SPACERS Left 11/14/2016   Procedure: EXCISIONAL LEFT TOTAL KNEE ARTHROPLASTY WITH  PLACEMENT OF ANTIBIOTIC SPACERS;  Surgeon: [name], MD;  Location: [facility];  Service: Orthopedics;  Laterality: Left;  . NISSEN FUNDOPLICATION    . PARAESOPHAGEAL HERNIA REPAIR  1989  . REVISION TOTAL KNEE ARTHROPLASTY Left 06/09/2017  . TONSILLECTOMY  1946  . TOTAL HIP ARTHROPLASTY Right 08/31/2012   Procedure: TOTAL HIP ARTHROPLASTY ANTERIOR APPROACH;  Surgeon: Mauri Pole, MD;  Location: [facility];  Service: Orthopedics;  Laterality: Right;  . TOTAL KNEE ARTHROPLASTY  2001   left  . TOTAL KNEE ARTHROPLASTY  2010   right  . TOTAL KNEE REVISION Left 06/09/2017    Procedure: LEFT REVISION KNEE ARTHROPLASTY;  Surgeon: [name], MD;  Location: [facility];  Service: Orthopedics;  Laterality: Left;   Social History   Social History Narrative   Patient lives at home alone.   Caffeine Use: 1 cup daily    Objective: Vital Signs: BP 116/68 (BP Location: Left Arm, Patient Position: Sitting, Cuff Size: Normal)   Pulse 62   Resp 15   Ht '5\' 6"'  (1.676 m)   Wt 191 lb 9.6 oz (86.9 kg)   BMI 30.93 kg/m    Physical Exam  Constitutional: She is oriented to person, place, and time. She appears well-developed and well-nourished.  HENT:  Head: Normocephalic and atraumatic.  Eyes: Conjunctivae and EOM are normal.  Neck: Normal range of motion.  Cardiovascular: Normal rate, regular rhythm, normal heart sounds and intact distal pulses.  Pulmonary/Chest: Effort normal and breath sounds normal.  Abdominal: Soft. Bowel sounds are normal.  Lymphadenopathy:    She has no cervical adenopathy.  Neurological: She is alert and oriented to  person, place, and time.  Skin: Skin is warm and dry. Capillary refill takes less than 2 seconds.  Psychiatric: She has a normal mood and affect. Her behavior is normal.  Nursing note and vitals reviewed.    Musculoskeletal Exam: He has limited range of motion of her cervical spine.  Thoracic and lumbar spine good range of motion.  Shoulder joints elbow joints were in good range of motion.  She has synovitis in her wrist joints and MCP joints as described below.  She had right hip replacement and bilateral knee replacement which appears to be doing well.  CDAI Exam: CDAI Score: 15  Patient Global Assessment: 5 (mm); Provider Global Assessment: 5 (mm) Swollen: 7 ; Tender: 7  Joint Exam      Right  Left  Wrist  Swollen Tender     MCP 2     Swollen Tender  MCP 3     Swollen Tender  IP     Swollen Tender  PIP 2     Swollen Tender  PIP 4     Swollen Tender  PIP 5  Swollen Tender        Investigation: No additional  findings.  Imaging: No results found.  Recent Labs: Lab Results  Component Value Date   WBC 10.1 02/26/2018   HGB 11.4 (L) 02/26/2018   PLT 308.0 02/26/2018   NA 136 02/26/2018   K 4.4 02/26/2018   CL 101 02/26/2018   CO2 29 02/26/2018   GLUCOSE 105 (H) 02/26/2018   BUN 27 (H) 02/26/2018   CREATININE 1.51 (H) 02/26/2018   BILITOT 0.8 02/26/2018   ALKPHOS 93 02/26/2018   AST 17 02/26/2018   ALT 10 02/26/2018   PROT 8.3 02/26/2018   ALBUMIN 4.0 02/26/2018   CALCIUM 10.0 02/26/2018   GFRAA 31 (L) 06/10/2017    Speciality Comments: PLQ Eye Exam: 03/19/18 WNL @ [facility] Exam follow up in 6 months.   Procedures:  No procedures performed Allergies: Ativan [lorazepam]; Haldol [haloperidol]; Epinephrine; Levofloxacin; Lipitor [atorvastatin]; and Trovan [alatrofloxacin]   Assessment / Plan:     Visit Diagnoses: Rheumatoid arthritis involving multiple sites with positive rheumatoid factor (HCC) - Positive RF, positive anti-CCP, elevated ESR, positive synovitis on examination.  She has active synovitis on examination today.  She took only 1 dose of Plaquenil and stopped that she has some GI side effects.  We had detailed discussion regarding different treatment options.  She wants to give Plaquenil another try.  She had left knee joint prosthetic infection in the past 10 she does not want to have any strong immunosuppressive agents.  If she has recurrence of side effects from Plaquenil we may have to consider Arava or Imuran.  She has low GFR which makes it challenging also. High risk medication use - Plaquenil 200 mg p.o. daily.  lower dose because of decreased GFR  History of total right hip replacement-she has chronic pain.  S/P total knee replacement, right-chronic pain  Status post revision of total knee, left-chronic pain  Infected prosthetic knee joint, sequela  DDD (degenerative disc disease), lumbar-chronic discomfort  Left foot drop  Fibromyalgia-she continues to have  generalized pain discomfort and positive tender points.  Chronic kidney disease (CKD), active medical management without dialysis, stage 3 (moderate) (HCC)  Dyslipidemia  Essential hypertension  Hx of colonic polyps  Hx of peripheral neuropathy-followed up by neurology.  History of depression  History of anxiety  Renal angiomyolipoma   Orders: No orders of the defined  types were placed in this encounter.  No orders of the defined types were placed in this encounter.   Face-to-face time spent with patient was 30 minutes. Greater than 50% of time was spent in counseling and coordination of care.  Follow-Up Instructions: Return in about 6 weeks (around 05/06/2018).   Bo Merino, MD  Note - This record has been created using Editor, commissioning.  Chart creation errors have been sought, but may not always  have been located. Such creation errors do not reflect on  the standard of medical care.

## 2018-03-19 DIAGNOSIS — M069 Rheumatoid arthritis, unspecified: Secondary | ICD-10-CM | POA: Diagnosis not present

## 2018-03-19 DIAGNOSIS — H35033 Hypertensive retinopathy, bilateral: Secondary | ICD-10-CM

## 2018-03-19 DIAGNOSIS — H2513 Age-related nuclear cataract, bilateral: Secondary | ICD-10-CM | POA: Diagnosis not present

## 2018-03-19 DIAGNOSIS — Z79899 Other long term (current) drug therapy: Secondary | ICD-10-CM | POA: Diagnosis not present

## 2018-03-19 DIAGNOSIS — H04123 Dry eye syndrome of bilateral lacrimal glands: Secondary | ICD-10-CM | POA: Diagnosis not present

## 2018-03-19 DIAGNOSIS — H25013 Cortical age-related cataract, bilateral: Secondary | ICD-10-CM

## 2018-03-19 HISTORY — DX: Hypertensive retinopathy, bilateral: H35.033

## 2018-03-19 HISTORY — DX: Cortical age-related cataract, bilateral: H25.013

## 2018-03-25 ENCOUNTER — Ambulatory Visit: Payer: PPO | Admitting: Rheumatology

## 2018-03-25 ENCOUNTER — Encounter: Payer: Self-pay | Admitting: Rheumatology

## 2018-03-25 VITALS — BP 116/68 | HR 62 | Resp 15 | Ht 66.0 in | Wt 191.6 lb

## 2018-03-25 DIAGNOSIS — N183 Chronic kidney disease, stage 3 unspecified: Secondary | ICD-10-CM

## 2018-03-25 DIAGNOSIS — M5136 Other intervertebral disc degeneration, lumbar region: Secondary | ICD-10-CM | POA: Diagnosis not present

## 2018-03-25 DIAGNOSIS — I10 Essential (primary) hypertension: Secondary | ICD-10-CM

## 2018-03-25 DIAGNOSIS — Z96641 Presence of right artificial hip joint: Secondary | ICD-10-CM | POA: Diagnosis not present

## 2018-03-25 DIAGNOSIS — Z8659 Personal history of other mental and behavioral disorders: Secondary | ICD-10-CM

## 2018-03-25 DIAGNOSIS — M797 Fibromyalgia: Secondary | ICD-10-CM | POA: Diagnosis not present

## 2018-03-25 DIAGNOSIS — Z96651 Presence of right artificial knee joint: Secondary | ICD-10-CM

## 2018-03-25 DIAGNOSIS — Z79899 Other long term (current) drug therapy: Secondary | ICD-10-CM | POA: Diagnosis not present

## 2018-03-25 DIAGNOSIS — Z96652 Presence of left artificial knee joint: Secondary | ICD-10-CM | POA: Diagnosis not present

## 2018-03-25 DIAGNOSIS — T8459XS Infection and inflammatory reaction due to other internal joint prosthesis, sequela: Secondary | ICD-10-CM | POA: Diagnosis not present

## 2018-03-25 DIAGNOSIS — M51369 Other intervertebral disc degeneration, lumbar region without mention of lumbar back pain or lower extremity pain: Secondary | ICD-10-CM

## 2018-03-25 DIAGNOSIS — M0579 Rheumatoid arthritis with rheumatoid factor of multiple sites without organ or systems involvement: Secondary | ICD-10-CM | POA: Diagnosis not present

## 2018-03-25 DIAGNOSIS — E785 Hyperlipidemia, unspecified: Secondary | ICD-10-CM | POA: Diagnosis not present

## 2018-03-25 DIAGNOSIS — Z96659 Presence of unspecified artificial knee joint: Secondary | ICD-10-CM

## 2018-03-25 DIAGNOSIS — Z8669 Personal history of other diseases of the nervous system and sense organs: Secondary | ICD-10-CM

## 2018-03-25 DIAGNOSIS — Z860100 Personal history of colon polyps, unspecified: Secondary | ICD-10-CM

## 2018-03-25 DIAGNOSIS — M21372 Foot drop, left foot: Secondary | ICD-10-CM | POA: Diagnosis not present

## 2018-03-25 DIAGNOSIS — D1771 Benign lipomatous neoplasm of kidney: Secondary | ICD-10-CM

## 2018-03-25 NOTE — Patient Instructions (Signed)
Standing Labs We placed an order today for your standing lab work.    Please come back and get your standing labs in 65mth then every 3 months   We have open lab Monday through Friday from 8:30-11:30 AM and 1:30-4:00 PM  at the office of Dr. Bo Merino.   You may experience shorter wait times on Monday and Friday afternoons. The office is located at [address] [place], [place], [place] [address] No appointment is necessary.   Labs are drawn by [facility].  You may receive a bill from [facility] for your lab work. If you have any questions regarding directions or hours of operation,  please call [phone].

## 2018-03-31 IMAGING — DX DG KNEE 1-2V PORT*L*
2 series · 2 of 2 positions shown · non-contrast
Comparison: 04/23/2017

CLINICAL DATA: Status post left knee revision

EXAM:
PORTABLE LEFT KNEE - 1-2 VIEW

[knee lat]
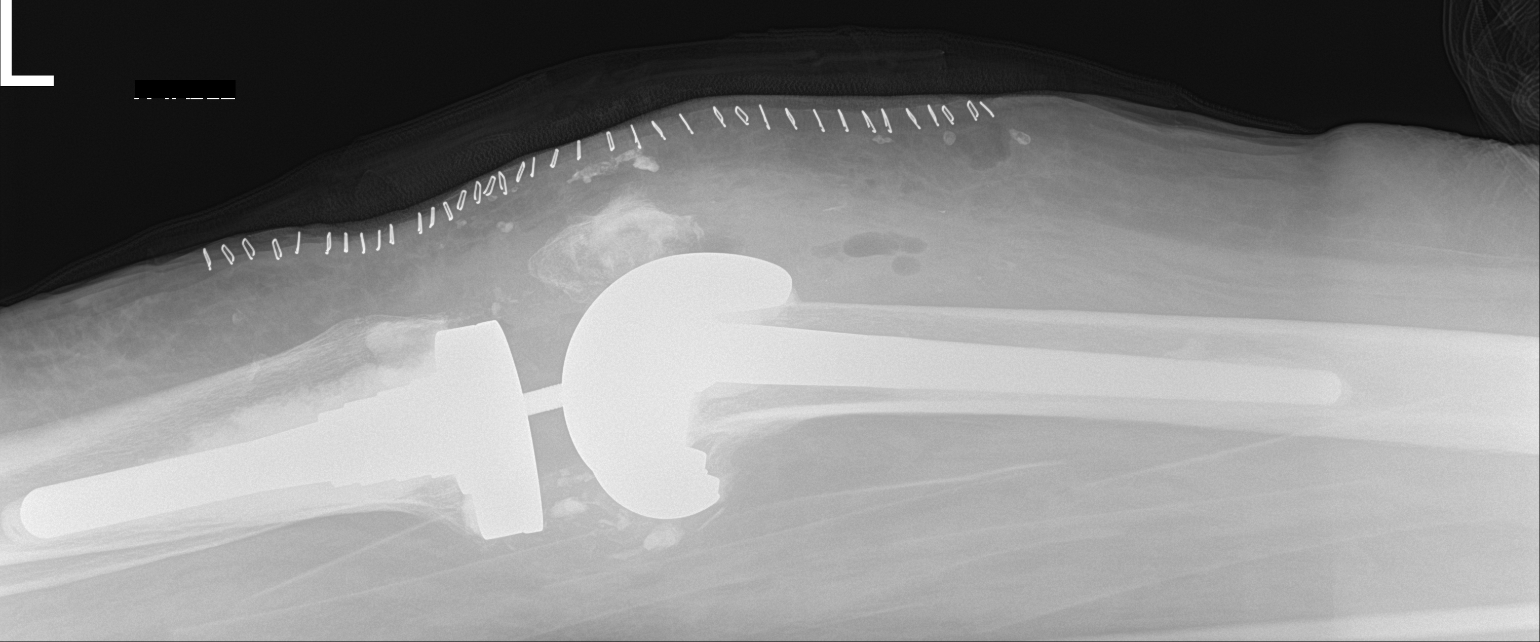

[knee obl]
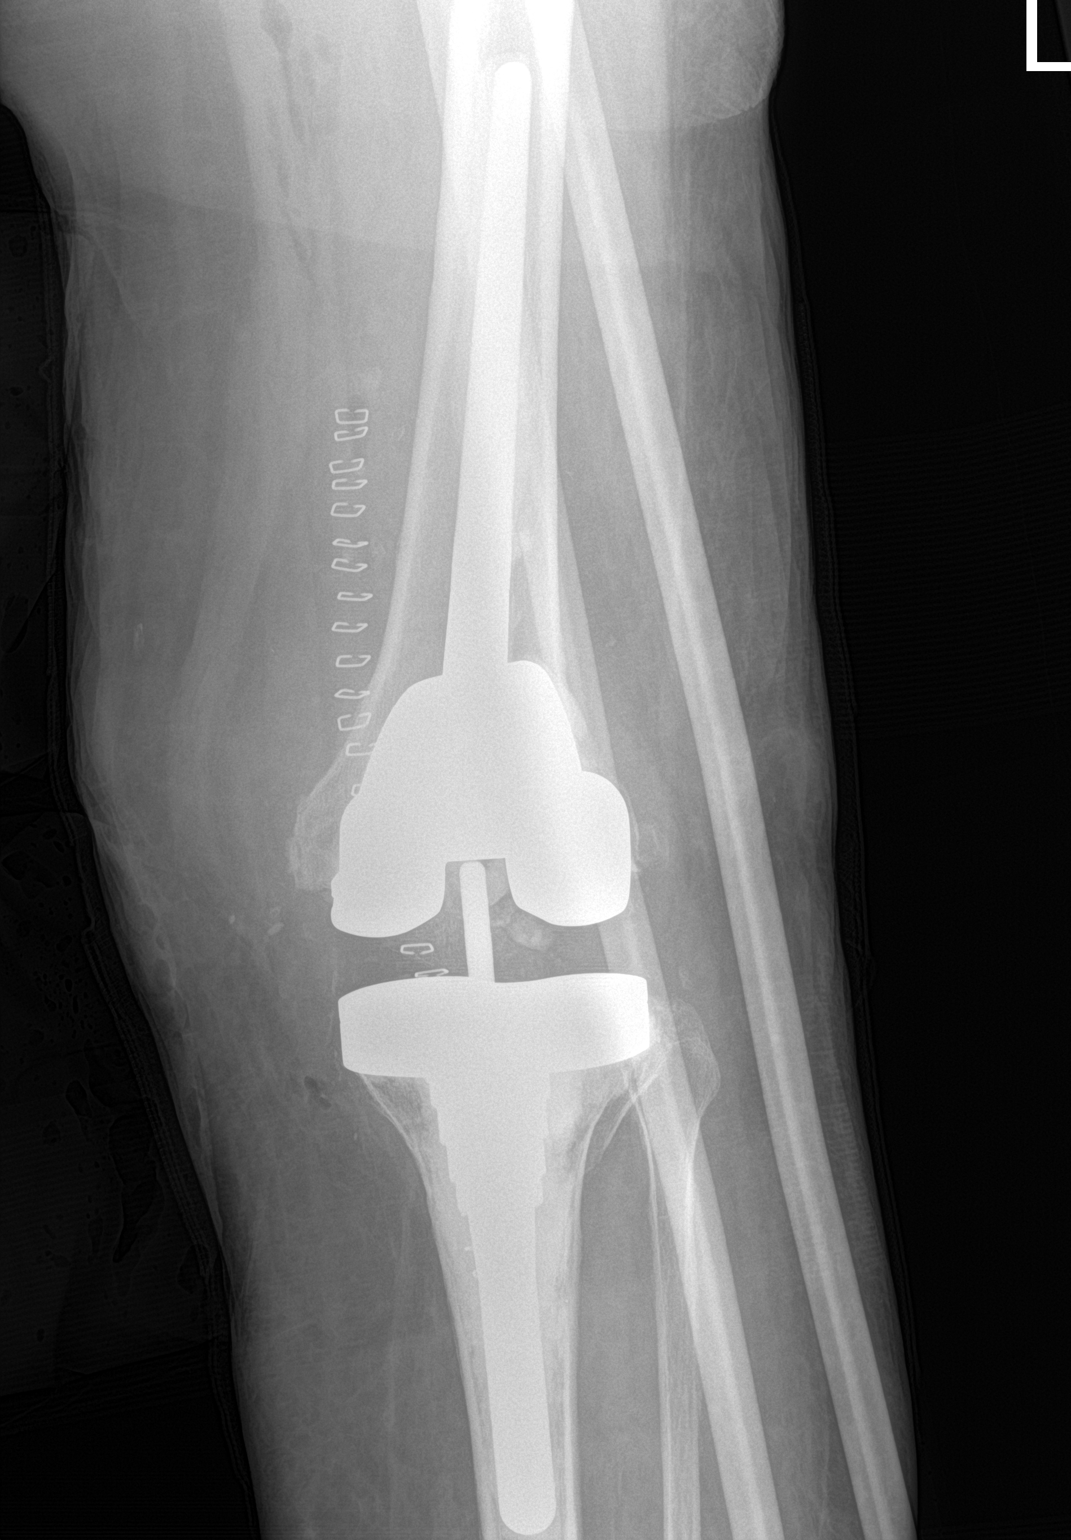

[2 of 2 positions shown; findings below may reference images not displayed]

FINDINGS: Knee replacement is now seen on the left. There is some suggestion
of fragmentation in the proximal tibia laterally which was not well
visualized on the prior exam. Clinical correlation is recommended.
Air is noted in surgical bed. No other focal abnormality is seen.
IMPRESSION: Irregularity in the lateral aspect of the proximal left tibia. This
may represent some bony fragmentation. This was not visualized on
the prior exam. Clinical correlation is recommended.

These results will be called to the ordering clinician or
representative by the Radiologist Assistant, and communication
documented in the PACS or zVision Dashboard.

## 2018-04-01 ENCOUNTER — Encounter: Payer: Self-pay | Admitting: Physical Therapy

## 2018-04-07 NOTE — Progress Notes (Signed)
Subjective:   Laura Chambers is a 77 y.o. female who presents for Medicare Annual (Subsequent) preventive examination.  Reports health as Seen 02/26/2018  TKR 2018  NEW med Dr. Estanislado Pandy  Plaquenil 200mg  po daily  Will fup Halloween day   Lost her oldest son in October of last year   Still having  low GFR  Creat 1.51  GFR 35.44; 02/2018   Diet Chol/hdl 4; trig 212  02/26/2018 105   Exercise Recovering from knee surgery   Educate regarding shingrix   Health Maintenance Due  Topic Date Due  . TETANUS/TDAP  05/19/1959  . DEXA SCAN  05/18/2005  . INFLUENZA VACCINE  02/04/2018    Cardiac Risk Factors include: advanced age (>31men, >59 women);dyslipidemia;family history of premature cardiovascular disease;hypertension   Colonoscopy 01/2014 and will repeat 2020  Mammogram 06/2016 (missed last year due to being in the nursing home Will schedule one this year  States her mother told her she did not have chicken pox   Will hold dexa until better;  Continue on calcium and vit D and keep walking Walks her acre walk     Objective:     Vitals: BP 110/60   Pulse 76   Ht 5\' 7"  (1.702 m)   Wt 190 lb (86.2 kg)   SpO2 95%   BMI 29.76 kg/m   Body mass index is 29.76 kg/m.  Advanced Directives 04/08/2018 06/10/2017 06/04/2017 04/03/2017 01/22/2017 01/12/2017 01/05/2017  Does Patient Have a Medical Advance Directive? No No No No Yes Yes Yes  Type of Advance Directive - - - - (No Data) (No Data) (No Data)  Does patient want to make changes to medical advance directive? - - - - No - Patient declined No - Patient declined No - Patient declined  Would patient like information on creating a medical advance directive? - No - Patient declined No - Patient declined Yes (MAU/Ambulatory/Procedural Areas - Information given) No - Patient declined No - Patient declined No - Patient declined  Pre-existing out of facility DNR order (yellow form or pink MOST form) - - - - - - -   Has son who  makes decisions for her 2 sons, one is taking care of grandmother 66 Other son Laura Chambers) [phone]    Tobacco Social History   Tobacco Use  Smoking Status Never Smoker  Smokeless Tobacco Never Used     Counseling given: Yes   Clinical Intake:     Past Medical History:  Diagnosis Date  . Anemia   . Anxiety   . Arthritis   . Atelectasis, left    left lower lung  . Blood transfusion without reported diagnosis   . Cataract cortical, senile, bilateral 03/19/2018  . Complication of anesthesia 2012   "irritated trachea" with cough x 1 year from intubation 2012.  smaller tube used with other surgeries  . Depression    just lost her son a month ago  . Esophageal hernia   . Fibromyalgia   . Headache    occasionally  . HTN (hypertension)   . Hx of colonic polyps   . Hydronephrosis of right kidney   . Hypercholesterolemia   . Hypertensive retinopathy of both eyes 03/19/2018  . 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 Obesity   . 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 Osteoporosis   . Renal angiomyolipoma    Past Surgical History:  Procedure Laterality Date  .  ABDOMINAL HYSTERECTOMY  1969  . BACK SURGERY  2012   rod in back-  lumbar disc removal with bone graft  . blephoroplasty     bilateral  . BREAST BIOPSY  1989  . CHOLECYSTECTOMY  2010  . EXCISIONAL TOTAL KNEE ARTHROPLASTY WITH ANTIBIOTIC SPACERS Left 11/14/2016   Procedure: EXCISIONAL LEFT TOTAL KNEE ARTHROPLASTY WITH  PLACEMENT OF ANTIBIOTIC SPACERS;  Surgeon: [name], MD;  Location: [facility];  Service: Orthopedics;  Laterality: Left;  . NISSEN FUNDOPLICATION    . PARAESOPHAGEAL HERNIA REPAIR  1989  . REVISION TOTAL KNEE ARTHROPLASTY Left 06/09/2017  . TONSILLECTOMY  1946  . TOTAL HIP ARTHROPLASTY Right 08/31/2012   Procedure: TOTAL HIP ARTHROPLASTY ANTERIOR APPROACH;  Surgeon: Mauri Pole, MD;  Location: [facility];  Service:  Orthopedics;  Laterality: Right;  . TOTAL KNEE ARTHROPLASTY  2001   left  . TOTAL KNEE ARTHROPLASTY  2010   right  . TOTAL KNEE REVISION Left 06/09/2017   Procedure: LEFT REVISION KNEE ARTHROPLASTY;  Surgeon: [name], MD;  Location: [place];  Service: Orthopedics;  Laterality: Left;   Family History  Problem Relation Age of Onset  . Diabetes Father   . Osteoarthritis Father   . Heart disease Father   . Stroke Father   . Osteoarthritis Mother   . Stroke Mother   . Pneumonia Mother   . Colon cancer Sister 16  . Colon polyps Unknown        neice/nephew  . Diabetes Sister   . Diabetes Brother    Social History   Socioeconomic History  . Marital status: Divorced    Spouse name: Not on file  . Number of children: 3  . Years of education: College  . 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 Financial resource strain: Not on file  . Food insecurity:    Worry: Not on file    Inability: Not on file  . Transportation needs:    Medical: Not on file    Non-medical: Not on file  Tobacco Use  . Smoking status: Never Smoker  . Smokeless tobacco: Never Used  Substance and Sexual Activity  . Alcohol use: No  . Drug use: No  . Sexual activity: Never  Lifestyle  . Physical activity:    Days per week: Not on file    Minutes per session: Not on file  . Stress: Not on file  Relationships  . Social connections:    Talks on phone: Not on file    Gets together: Not on file    Attends religious service: Not on file    Active member of club or organization: Not on file    Attends meetings of clubs or organizations: Not on file    Relationship status: Not on file  Other Topics Concern  . Not on file  Social History Narrative   Patient lives at home alone.   Caffeine Use: 1 cup daily    Outpatient Encounter Medications as of 04/08/2018  Medication Sig  . amLODipine (NORVASC)  10 MG tablet Take 1 tablet (10 mg total) by mouth daily.  Marland Kitchen atenolol (TENORMIN) 50 MG tablet Take 1 tablet (50 mg total) by mouth daily.  . Calcium Carbonate-Vitamin D (CALCIUM 600+D) 600-400 MG-UNIT tablet Take 1 tablet by mouth every other day.  . ferrous sulfate 325 (65 FE) MG EC tablet Take 325  mg by mouth 3 (three) times daily with meals.  Marland Kitchen FLUoxetine (PROZAC) 20 MG capsule Take 1 capsule (20 mg total) by mouth every morning.  . folic acid (FOLVITE) 128 MCG tablet Take 800 mcg daily by mouth.   . furosemide (LASIX) 40 MG tablet Take 1 tablet (40 mg total) daily as needed by mouth for edema.  . gabapentin (NEURONTIN) 300 MG capsule Take 1 capsule (300 mg total) by mouth 3 (three) times daily.  . Ginger, Zingiber officinalis, (GINGER ROOT PO) Take by mouth daily.  . hydroxychloroquine (PLAQUENIL) 200 MG tablet Take by mouth daily.  . Misc Natural Products (TART CHERRY ADVANCED PO) Take by mouth.  . Multiple Vitamin (MULTIVITAMIN WITH MINERALS) TABS tablet Take 1 tablet by mouth every other day.  . Omega-3 1000 MG CAPS Take by mouth.  . promethazine (PHENERGAN) 25 MG tablet Take 1 tablet (25 mg total) by mouth every 8 (eight) hours as needed for nausea or vomiting.  . rosuvastatin (CRESTOR) 10 MG tablet TAKE 1 TABLET BY MOUTH DAILY IN EVENING  . tamsulosin (FLOMAX) 0.4 MG CAPS capsule Take 0.4 mg by mouth as needed.  . Turmeric 400 MG CAPS Take by mouth.   No facility-administered encounter medications on file as of 04/08/2018.     Activities of Daily Living In your present state of health, do you have any difficulty performing the following activities: 04/08/2018 06/10/2017  Hearing? N -  [place]? N -  Difficulty concentrating or making decisions? N -  Walking or climbing stairs? Y -  Dressing or bathing? N -  Doing errands, shopping? N Y  Conservation officer, nature and eating ? N -  Using the Toilet? N -  Managing your Medications? N -  Managing your Finances? N -  Housekeeping or  managing your Housekeeping? N -  Some recent data might be hidden    Patient Care Team: Briscoe Deutscher, DO as PCP - General (Family Medicine) Paralee Cancel, MD as Consulting Physician (Orthopedic Surgery) Hortencia Pilar, MD as Consulting Physician (Ophthalmology)    Assessment:   This is a routine wellness examination for [place].  Exercise Activities and Dietary recommendations Current Exercise Habits: Home exercise routine, Type of exercise: walking  Goals    . Patient Stated     Want to get better and feel better        Fall Risk Fall Risk  04/08/2018 04/03/2017 01/27/2017  Falls in the past year? Yes Yes No  Number falls in past yr: 2 or more - -  Injury with Fall? No - -  Comment got up to walk around the bed; fell backwards on bench at the foot of the bed. 2nd fall last month, BR at hs fell from bathroom, felt one of the legs gave away, balance  - -  Follow up Education provided - -   Fell x 2; thinks knee gave away; not sure what happened  Offered PT but states she is exercising at home  Has foot drop due to post knee surgery Gilford Rile hurts hands and feels it may put her more at risk  Discussed helpline; Plan will take her phone with her  Feels like she is ok for now and declines   Sons are active in her life and has great friends     Depression Screen PHQ 2/9 Scores 04/08/2018 08/17/2017 04/03/2017 01/27/2017  PHQ - 2 Score 1 5 0 0  PHQ- 9 Score - 16 - -  Prozac has helped States she is understanding she can't fix others and is concentrating on getting herself well   Cognitive Function MMSE - Mini Mental State Exam 04/08/2018  Not completed: (No Data)   Ad8 score reviewed for issues:  Issues making decisions:  Less interest in hobbies / activities:  Repeats questions, stories (family complaining):  Trouble using ordinary gadgets (microwave, computer, phone):  Forgets the month or year:   Mismanaging finances:   Remembering appts:  Daily  problems with thinking and/or memory: Ad8 score is=0          Immunization History  Administered Date(s) Administered  . Influenza,inj,Quad PF,6+ Mos 03/13/2017  . Influenza-Unspecified 03/26/2018  . Pneumococcal Conjugate-13 02/26/2018      Screening Tests Health Maintenance  Topic Date Due  . TETANUS/TDAP  05/19/1959  . DEXA SCAN  05/18/2005  . INFLUENZA VACCINE  02/04/2018  . COLONOSCOPY  01/26/2019  . PNA vac Low Risk Adult (2 of 2 - PPSV23) 02/27/2019        Plan:      PCP Notes   Health Maintenance Colonoscopy 01/2014 and will repeat 2020  Mammogram 06/2016 (missed last year due to being in the nursing home Will schedule one this year  States her mother told her she did not have chicken pox  Dr. Estanislado Pandy is recommending   Will hold dexa until better- perhaps next year  Continue on calcium and vit D and keep walking Walks around her yard; one acre   Will check to see if she has had tetanus   Abnormal Screens  Fell x 2; thinks knee gave away; not sure what happened  Offered PT but states she is exercising at home  Has foot drop due to post knee surgery Walker hurts hands and feels it may put her more at risk  Discussed helpline; Plan will take her phone with her  Feels like she is ok for now and declines   Referrals  Declines PT   Patient concerns; Continuing to improve  exacerbation of RH And monitoring of her kidneys  NEW med Dr. Estanislado Pandy  Plaquenil 200mg  po daily  Will fup Halloween day    Nurse Concerns; As noted   Next PCP apt 08/30/18     I have personally reviewed and noted the following in the patient's chart:   . Medical and social history . Use of alcohol, tobacco or illicit drugs  . Current medications and supplements . Functional ability and status . Nutritional status . Physical activity . Advanced directives . List of other physicians . Hospitalizations, surgeries, and ER visits in previous 12  months . Vitals . Screenings to include cognitive, depression, and falls . Referrals and appointments  In addition, I have reviewed and discussed with patient certain preventive protocols, quality metrics, and best practice recommendations. A written personalized care plan for preventive services as well as general preventive health recommendations were provided to patient.     Wynetta Fines, RN  04/08/2018

## 2018-04-08 ENCOUNTER — Ambulatory Visit (INDEPENDENT_AMBULATORY_CARE_PROVIDER_SITE_OTHER): Payer: PPO

## 2018-04-08 VITALS — BP 110/60 | HR 76 | Ht 67.0 in | Wt 190.0 lb

## 2018-04-08 DIAGNOSIS — Z0000 Encounter for general adult medical examination without abnormal findings: Secondary | ICD-10-CM

## 2018-04-08 NOTE — Patient Instructions (Addendum)
Laura Chambers , Thank you for taking time to come for your Medicare Wellness Visit. I appreciate your ongoing commitment to your health goals. Please review the following plan we discussed and let me know if I can assist you in the future.   Will Schedule mammgoram this year   Will hold dexa for now ( bone density )   Will review for tetanus at the drug store A Tetanus is recommended every 10 years. Medicare covers a tetanus if you have a cut or wound; otherwise, there may be a charge. If you had not had a tetanus with pertusses, known as the Tdap, you can take this anytime.   (mother feels she never had chicken pox)  Shingrix is a vaccine for the prevention of Shingles in Adults 65 and older.  If you are on Medicare, the shingrix is covered under your Part D plan, so you will take both of the vaccines in the series at your pharmacy. Please check with your benefits regarding applicable copays or out of pocket expenses.  The Shingrix is given in 2 vaccines approx 8 weeks apart. You must receive the 2nd dose prior to 6 months from receipt of the first. Please have the pharmacist print out you Immunization  dates for our office records    These are the goals we discussed: Goals    . Patient Stated     Want to get better and feel better        This is a list of the screening recommended for you and due dates:  Health Maintenance  Topic Date Due  . Tetanus Vaccine  05/19/1960  . DEXA scan (bone density measurement)  05/18/2005  . Flu Shot  02/04/2018  . Colon Cancer Screening  01/26/2019  . Pneumonia vaccines (2 of 2 - PPSV23) 02/27/2019    '  Fall Prevention in the Home Falls can cause injuries. They can happen to people of all ages. There are many things you can do to make your home safe and to help prevent falls. What can I do on the outside of my home?  Regularly fix the edges of walkways and driveways and fix any cracks.  Remove anything that might make you trip as you  walk through a door, such as a raised step or threshold.  Trim any bushes or trees on the path to your home.  Use bright outdoor lighting.  Clear any walking paths of anything that might make someone trip, such as rocks or tools.  Regularly check to see if handrails are loose or broken. Make sure that both sides of any steps have handrails.  Any raised decks and porches should have guardrails on the edges.  Have any leaves, snow, or ice cleared regularly.  Use sand or salt on walking paths during winter.  Clean up any spills in your garage right away. This includes oil or grease spills. What can I do in the bathroom?  Use night lights.  Install grab bars by the toilet and in the tub and shower. Do not use towel bars as grab bars.  Use non-skid mats or decals in the tub or shower.  If you need to sit down in the shower, use a plastic, non-slip stool.  Keep the floor dry. Clean up any water that spills on the floor as soon as it happens.  Remove soap buildup in the tub or shower regularly.  Attach bath mats securely with double-sided non-slip rug tape.  Do not have throw  rugs and other things on the floor that can make you trip. What can I do in the bedroom?  Use night lights.  Make sure that you have a light by your bed that is easy to reach.  Do not use any sheets or blankets that are too big for your bed. They should not hang down onto the floor.  Have a firm chair that has side arms. You can use this for support while you get dressed.  Do not have throw rugs and other things on the floor that can make you trip. What can I do in the kitchen?  Clean up any spills right away.  Avoid walking on wet floors.  Keep items that you use a lot in easy-to-reach places.  If you need to reach something above you, use a strong step stool that has a grab bar.  Keep electrical cords out of the way.  Do not use floor polish or wax that makes floors slippery. If you must use  wax, use non-skid floor wax.  Do not have throw rugs and other things on the floor that can make you trip. What can I do with my stairs?  Do not leave any items on the stairs.  Make sure that there are handrails on both sides of the stairs and use them. Fix handrails that are broken or loose. Make sure that handrails are as long as the stairways.  Check any carpeting to make sure that it is firmly attached to the stairs. Fix any carpet that is loose or worn.  Avoid having throw rugs at the top or bottom of the stairs. If you do have throw rugs, attach them to the floor with carpet tape.  Make sure that you have a light switch at the top of the stairs and the bottom of the stairs. If you do not have them, ask someone to add them for you. What else can I do to help prevent falls?  Wear shoes that: ? Do not have high heels. ? Have rubber bottoms. ? Are comfortable and fit you well. ? Are closed at the toe. Do not wear sandals.  If you use a stepladder: ? Make sure that it is fully opened. Do not climb a closed stepladder. ? Make sure that both sides of the stepladder are locked into place. ? Ask someone to hold it for you, if possible.  Clearly mark and make sure that you can see: ? Any grab bars or handrails. ? First and last steps. ? Where the edge of each step is.  Use tools that help you move around (mobility aids) if they are needed. These include: ? Canes. ? Walkers. ? Scooters. ? Crutches.  Turn on the lights when you go into a dark area. Replace any light bulbs as soon as they burn out.  Set up your furniture so you have a clear path. Avoid moving your furniture around.  If any of your floors are uneven, fix them.  If there are any pets around you, be aware of where they are.  Review your medicines with your doctor. Some medicines can make you feel dizzy. This can increase your chance of falling. Ask your doctor what other things that you can do to help prevent  falls. This information is not intended to replace advice given to you by your health care provider. Make sure you discuss any questions you have with your health care provider. Document Released: 04/19/2009 Document Revised: 11/29/2015 Document Reviewed: 07/28/2014  Chartered certified accountant Patient Education  2018 [place] Maintenance, Female Adopting a healthy lifestyle and getting preventive care can go a long way to promote health and wellness. Talk with your health care provider about what schedule of regular examinations is right for you. This is a good chance for you to check in with your provider about disease prevention and staying healthy. In between checkups, there are plenty of things you can do on your own. Experts have done a lot of research about which lifestyle changes and preventive measures are most likely to keep you healthy. Ask your health care provider for more information. Weight and diet Eat a healthy diet  Be sure to include plenty of vegetables, fruits, low-fat dairy products, and lean protein.  Do not eat a lot of foods high in solid fats, added sugars, or salt.  Get regular exercise. This is one of the most important things you can do for your health. ? Most adults should exercise for at least 150 minutes each week. The exercise should increase your heart rate and make you sweat (moderate-intensity exercise). ? Most adults should also do strengthening exercises at least twice a week. This is in addition to the moderate-intensity exercise.  Maintain a healthy weight  Body mass index (BMI) is a measurement that can be used to identify possible weight problems. It estimates body fat based on height and weight. Your health care provider can help determine your BMI and help you achieve or maintain a healthy weight.  For females 13 years of age and older: ? A BMI below 18.5 is considered underweight. ? A BMI of 18.5 to 24.9 is normal. ? A BMI of 25 to 29.9 is  considered overweight. ? A BMI of 30 and above is considered obese.  Watch levels of cholesterol and blood lipids  You should start having your blood tested for lipids and cholesterol at 78 years of age, then have this test every 5 years.  You may need to have your cholesterol levels checked more often if: ? Your lipid or cholesterol levels are high. ? You are older than 78 years of age. ? You are at high risk for heart disease.  Cancer screening Lung Cancer  Lung cancer screening is recommended for adults 95-8 years old who are at high risk for lung cancer because of a history of smoking.  A yearly low-dose CT scan of the lungs is recommended for people who: ? Currently smoke. ? Have quit within the past 15 years. ? Have at least a 30-pack-year history of smoking. A pack year is smoking an average of one pack of cigarettes a day for 1 year.  Yearly screening should continue until it has been 15 years since you quit.  Yearly screening should stop if you develop a health problem that would prevent you from having lung cancer treatment.  Breast Cancer  Practice breast self-awareness. This means understanding how your breasts normally appear and feel.  It also means doing regular breast self-exams. Let your health care provider know about any changes, no matter how small.  If you are in your 20s or 30s, you should have a clinical breast exam (CBE) by a health care provider every 1-3 years as part of a regular health exam.  If you are 72 or older, have a CBE every year. Also consider having a breast X-ray (mammogram) every year.  If you have a family history of breast cancer, talk to your health care provider about  genetic screening.  If you are at high risk for breast cancer, talk to your health care provider about having an MRI and a mammogram every year.  Breast cancer gene (BRCA) assessment is recommended for women who have family members with BRCA-related cancers.  BRCA-related cancers include: ? Breast. ? Ovarian. ? Tubal. ? Peritoneal cancers.  Results of the assessment will determine the need for genetic counseling and BRCA1 and BRCA2 testing.  Cervical Cancer Your health care provider may recommend that you be screened regularly for cancer of the pelvic organs (ovaries, uterus, and vagina). This screening involves a pelvic examination, including checking for microscopic changes to the surface of your cervix (Pap test). You may be encouraged to have this screening done every 3 years, beginning at age 28.  For women ages 52-65, health care providers may recommend pelvic exams and Pap testing every 3 years, or they may recommend the Pap and pelvic exam, combined with testing for human papilloma virus (HPV), every 5 years. Some types of HPV increase your risk of cervical cancer. Testing for HPV may also be done on women of any age with unclear Pap test results.  Other health care providers may not recommend any screening for nonpregnant women who are considered low risk for pelvic cancer and who do not have symptoms. Ask your health care provider if a screening pelvic exam is right for you.  If you have had past treatment for cervical cancer or a condition that could lead to cancer, you need Pap tests and screening for cancer for at least 20 years after your treatment. If Pap tests have been discontinued, your risk factors (such as having a new sexual partner) need to be reassessed to determine if screening should resume. Some women have medical problems that increase the chance of getting cervical cancer. In these cases, your health care provider may recommend more frequent screening and Pap tests.  Colorectal Cancer  This type of cancer can be detected and often prevented.  Routine colorectal cancer screening usually begins at 78 years of age and continues through 78 years of age.  Your health care provider may recommend screening at an earlier age if  you have risk factors for colon cancer.  Your health care provider may also recommend using home test kits to check for hidden blood in the stool.  A small camera at the end of a tube can be used to examine your colon directly (sigmoidoscopy or colonoscopy). This is done to check for the earliest forms of colorectal cancer.  Routine screening usually begins at age 80.  Direct examination of the colon should be repeated every 5-10 years through 78 years of age. However, you may need to be screened more often if early forms of precancerous polyps or small growths are found.  Skin Cancer  Check your skin from head to toe regularly.  Tell your health care provider about any new moles or changes in moles, especially if there is a change in a mole's shape or color.  Also tell your health care provider if you have a mole that is larger than the size of a pencil eraser.  Always use sunscreen. Apply sunscreen liberally and repeatedly throughout the day.  Protect yourself by wearing long sleeves, pants, a wide-brimmed hat, and sunglasses whenever you are outside.  Heart disease, diabetes, and high blood pressure  High blood pressure causes heart disease and increases the risk of stroke. High blood pressure is more likely to develop in: ? People  who have blood pressure in the high end of the normal range (130-139/85-89 mm Hg). ? People who are overweight or obese. ? People who are African American.  If you are 63-70 years of age, have your blood pressure checked every 3-5 years. If you are 55 years of age or older, have your blood pressure checked every year. You should have your blood pressure measured twice-once when you are at a hospital or clinic, and once when you are not at a hospital or clinic. Record the average of the two measurements. To check your blood pressure when you are not at a hospital or clinic, you can use: ? An automated blood pressure machine at a pharmacy. ? A home blood  pressure monitor.  If you are between 54 years and 64 years old, ask your health care provider if you should take aspirin to prevent strokes.  Have regular diabetes screenings. This involves taking a blood sample to check your fasting blood sugar level. ? If you are at a normal weight and have a low risk for diabetes, have this test once every three years after 78 years of age. ? If you are overweight and have a high risk for diabetes, consider being tested at a younger age or more often. Preventing infection Hepatitis B  If you have a higher risk for hepatitis B, you should be screened for this virus. You are considered at high risk for hepatitis B if: ? You were born in a country where hepatitis B is common. Ask your health care provider which countries are considered high risk. ? Your parents were born in a high-risk country, and you have not been immunized against hepatitis B (hepatitis B vaccine). ? You have HIV or AIDS. ? You use needles to inject street drugs. ? You live with someone who has hepatitis B. ? You have had sex with someone who has hepatitis B. ? You get hemodialysis treatment. ? You take certain medicines for conditions, including cancer, organ transplantation, and autoimmune conditions.  Hepatitis C  Blood testing is recommended for: ? Everyone born from [birth year] through 1966. ? Anyone with known risk factors for hepatitis C.  Sexually transmitted infections (STIs)  You should be screened for sexually transmitted infections (STIs) including gonorrhea and chlamydia if: ? You are sexually active and are younger than 78 years of age. ? You are older than 78 years of age and your health care provider tells you that you are at risk for this type of infection. ? Your sexual activity has changed since you were last screened and you are at an increased risk for chlamydia or gonorrhea. Ask your health care provider if you are at risk.  If you do not have HIV, but are at risk,  it may be recommended that you take a prescription medicine daily to prevent HIV infection. This is called pre-exposure prophylaxis (PrEP). You are considered at risk if: ? You are sexually active and do not regularly use condoms or know the HIV status of your partner(s). ? You take drugs by injection. ? You are sexually active with a partner who has HIV.  Talk with your health care provider about whether you are at high risk of being infected with HIV. If you choose to begin PrEP, you should first be tested for HIV. You should then be tested every 3 months for as long as you are taking PrEP. Pregnancy  If you are premenopausal and you may become pregnant, ask your health care  provider about preconception counseling.  If you may become pregnant, take 400 to 800 micrograms (mcg) of folic acid every day.  If you want to prevent pregnancy, talk to your health care provider about birth control (contraception). Osteoporosis and menopause  Osteoporosis is a disease in which the bones lose minerals and strength with aging. This can result in serious bone fractures. Your risk for osteoporosis can be identified using a bone density scan.  If you are 21 years of age or older, or if you are at risk for osteoporosis and fractures, ask your health care provider if you should be screened.  Ask your health care provider whether you should take a calcium or vitamin D supplement to lower your risk for osteoporosis.  Menopause may have certain physical symptoms and risks.  Hormone replacement therapy may reduce some of these symptoms and risks. Talk to your health care provider about whether hormone replacement therapy is right for you. Follow these instructions at home:  Schedule regular health, dental, and eye exams.  Stay current with your immunizations.  Do not use any tobacco products including cigarettes, chewing tobacco, or electronic cigarettes.  If you are pregnant, do not drink  alcohol.  If you are breastfeeding, limit how much and how often you drink alcohol.  Limit alcohol intake to no more than 1 drink per day for nonpregnant women. One drink equals 12 ounces of beer, 5 ounces of wine, or 1 ounces of hard liquor.  Do not use street drugs.  Do not share needles.  Ask your health care provider for help if you need support or information about quitting drugs.  Tell your health care provider if you often feel depressed.  Tell your health care provider if you have ever been abused or do not feel safe at home. This information is not intended to replace advice given to you by your health care provider. Make sure you discuss any questions you have with your health care provider. Document Released: 01/06/2011 Document Revised: 11/29/2015 Document Reviewed: 03/27/2015 [employer] Interactive Patient Education  [employer].

## 2018-04-09 ENCOUNTER — Ambulatory Visit: Payer: PPO | Admitting: *Deleted

## 2018-04-09 NOTE — Progress Notes (Signed)
I have personally reviewed the Medicare Annual Wellness Visit and agree with the assessment and plan.  Laura Chambers. [name], MD 04/09/2018 10:31 AM

## 2018-04-16 ENCOUNTER — Encounter: Payer: PPO | Admitting: Family Medicine

## 2018-04-22 NOTE — Progress Notes (Deleted)
Office Visit Note  Patient: Laura Chambers             Date of Birth: September 08, 1940           MRN: [id number]             PCP: Briscoe Deutscher, DO Referring: Briscoe Deutscher, DO Visit Date: 05/06/2018 Occupation: @GUAROCC @  Subjective:  No chief complaint on file.   History of Present Illness: Laura Chambers is a 77 y.o. female ***   Activities of Daily Living:  Patient reports morning stiffness for *** {minute/hour:19697}.   Patient {ACTIONS;DENIES/REPORTS:21021675::"Denies"} nocturnal pain.  Difficulty dressing/grooming: {ACTIONS;DENIES/REPORTS:21021675::"Denies"} Difficulty climbing stairs: {ACTIONS;DENIES/REPORTS:21021675::"Denies"} Difficulty getting out of chair: {ACTIONS;DENIES/REPORTS:21021675::"Denies"} Difficulty using hands for taps, buttons, cutlery, and/or writing: {ACTIONS;DENIES/REPORTS:21021675::"Denies"}  No Rheumatology ROS completed.   PMFS History:  Patient Active Problem List   Diagnosis Date Noted  . Hx of peripheral neuropathy 12/15/2017  . Hx of colonic polyps 12/15/2017  . History of depression 12/15/2017  . History of anxiety 12/15/2017  . 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 Chronic kidney disease (CKD), active medical management without dialysis, stage 3 (moderate) ([place]) 12/15/2017  . Fibromyalgia 12/15/2017  . History of total right hip replacement 12/15/2017  . DDD (degenerative disc disease), lumbar 12/15/2017  . 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 Left foot drop 12/15/2017  . Pain in both hands 09/25/2017  . 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 Status post revision of total knee, left 06/09/2017  . Obesity, Class III, BMI 40-49.9 (morbid obesity) ([place]) 12/28/2016  . Anemia 12/16/2016  . AKI (acute kidney injury) ([place]) 12/15/2016  . Essential hypertension 12/08/2016  . 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 Knee pain, left 11/11/2016  . Pain in left leg 11/10/2016  . Musculoskeletal neck pain  08/19/2015  . Peripheral neuropathy 01/12/2014  . Hyperglycemia 06/16/2012  . 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 Dyslipidemia 06/16/2012  . Globus sensation 02/04/2012  . Nausea 02/04/2012  . Bowel habit changes 02/04/2012    Past Medical History:  Diagnosis Date  . Anemia   . Anxiety   . Arthritis   . Atelectasis, left    left lower lung  . Blood transfusion without reported diagnosis   . Cataract cortical, senile, bilateral 03/19/2018  . Complication of anesthesia 2012   "irritated trachea" with cough x 1 year from intubation 2012.  smaller tube used with other surgeries  . Depression    just lost her son a month ago  . Esophageal hernia   . Fibromyalgia   . Headache    occasionally  . HTN (hypertension)   . Hx of colonic polyps   . Hydronephrosis of right kidney   . Hypercholesterolemia   . Hypertensive retinopathy of both eyes 03/19/2018  . Neuropathy due to medical condition ([place])    bilateral lower legs  . Obesity   . 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 Osteoporosis   . Renal angiomyolipoma     Family History  Problem Relation Age of Onset  . Diabetes Father   . Osteoarthritis Father   . Heart disease Father   . Stroke Father   . Osteoarthritis Mother   . Stroke Mother   . Pneumonia Mother   . Colon cancer Sister 2  . Colon polyps Unknown        neice/nephew  . Diabetes Sister   . Diabetes Brother    Past Surgical History:  Procedure  Laterality Date  . ABDOMINAL HYSTERECTOMY  1969  . BACK SURGERY  2012   rod in back-  lumbar disc removal with bone graft  . blephoroplasty     bilateral  . BREAST BIOPSY  1989  . CHOLECYSTECTOMY  2010  . EXCISIONAL TOTAL KNEE ARTHROPLASTY WITH ANTIBIOTIC SPACERS Left 11/14/2016   Procedure: EXCISIONAL LEFT TOTAL KNEE ARTHROPLASTY WITH  PLACEMENT OF ANTIBIOTIC SPACERS;  Surgeon: [name], MD;  Location: [facility];  Service: Orthopedics;   Laterality: Left;  . NISSEN FUNDOPLICATION    . PARAESOPHAGEAL HERNIA REPAIR  1989  . REVISION TOTAL KNEE ARTHROPLASTY Left 06/09/2017  . TONSILLECTOMY  1946  . TOTAL HIP ARTHROPLASTY Right 08/31/2012   Procedure: TOTAL HIP ARTHROPLASTY ANTERIOR APPROACH;  Surgeon: Mauri Pole, MD;  Location: [facility];  Service: Orthopedics;  Laterality: Right;  . TOTAL KNEE ARTHROPLASTY  2001   left  . TOTAL KNEE ARTHROPLASTY  2010   right  . TOTAL KNEE REVISION Left 06/09/2017   Procedure: LEFT REVISION KNEE ARTHROPLASTY;  Surgeon: [name], MD;  Location: [facility];  Service: Orthopedics;  Laterality: Left;   Social History   Social History Narrative   Patient lives at home alone.   Caffeine Use: 1 cup daily    Objective: Vital Signs: There were no vitals taken for this visit.   Physical Exam   Musculoskeletal Exam: ***  CDAI Exam: CDAI Score: Not documented Patient Global Assessment: Not documented; Provider Global Assessment: Not documented Swollen: Not documented; Tender: Not documented Joint Exam   Not documented   There is currently no information documented on the homunculus. Go to the Rheumatology activity and complete the homunculus joint exam.  Investigation: No additional findings.  Imaging: No results found.  Recent Labs: Lab Results  Component Value Date   WBC 10.1 02/26/2018   HGB 11.4 (L) 02/26/2018   PLT 308.0 02/26/2018   NA 136 02/26/2018   K 4.4 02/26/2018   CL 101 02/26/2018   CO2 29 02/26/2018   GLUCOSE 105 (H) 02/26/2018   BUN 27 (H) 02/26/2018   CREATININE 1.51 (H) 02/26/2018   BILITOT 0.8 02/26/2018   ALKPHOS 93 02/26/2018   AST 17 02/26/2018   ALT 10 02/26/2018   PROT 8.3 02/26/2018   ALBUMIN 4.0 02/26/2018   CALCIUM 10.0 02/26/2018   GFRAA 31 (L) 06/10/2017    Speciality Comments: PLQ Eye Exam: 03/19/18 WNL @ [facility] [place] Exam follow up in 6 months.   Procedures:  No procedures performed Allergies: Ativan [lorazepam]; Haldol  [haloperidol]; Epinephrine; Levofloxacin; Lipitor [atorvastatin]; and Trovan [alatrofloxacin]   Assessment / Plan:     Visit Diagnoses: No diagnosis found.   Orders: No orders of the defined types were placed in this encounter.  No orders of the defined types were placed in this encounter.   Face-to-face time spent with patient was *** minutes. Greater than 50% of time was spent in counseling and coordination of care.  Follow-Up Instructions: No follow-ups on file.   Earnestine Mealing, CMA  Note - This record has been created using Editor, commissioning.  Chart creation errors have been sought, but may not always  have been located. Such creation errors do not reflect on  the standard of medical care.

## 2018-04-23 ENCOUNTER — Other Ambulatory Visit: Payer: Self-pay | Admitting: *Deleted

## 2018-04-23 DIAGNOSIS — Z79899 Other long term (current) drug therapy: Secondary | ICD-10-CM

## 2018-04-23 LAB — CBC WITH DIFFERENTIAL/PLATELET
BASOS ABS: 67 {cells}/uL (ref 0–200)
Basophils Relative: 0.7 %
EOS ABS: 380 {cells}/uL (ref 15–500)
Eosinophils Relative: 4 %
HCT: 34.9 % — ABNORMAL LOW (ref 35.0–45.0)
Hemoglobin: 11.2 g/dL — ABNORMAL LOW (ref 11.7–15.5)
Lymphs Abs: 2071 cells/uL (ref 850–3900)
MCH: 27 pg (ref 27.0–33.0)
MCHC: 32.1 g/dL (ref 32.0–36.0)
MCV: 84.1 fL (ref 80.0–100.0)
MONOS PCT: 8.5 %
MPV: 12 fL (ref 7.5–12.5)
Neutro Abs: 6175 cells/uL (ref 1500–7800)
Neutrophils Relative %: 65 %
PLATELETS: 291 10*3/uL (ref 140–400)
RBC: 4.15 10*6/uL (ref 3.80–5.10)
RDW: 13.2 % (ref 11.0–15.0)
TOTAL LYMPHOCYTE: 21.8 %
WBC mixed population: 808 cells/uL (ref 200–950)
WBC: 9.5 10*3/uL (ref 3.8–10.8)

## 2018-04-23 LAB — COMPLETE METABOLIC PANEL WITH GFR
AG RATIO: 1.1 (calc) (ref 1.0–2.5)
ALBUMIN MSPROF: 3.7 g/dL (ref 3.6–5.1)
ALT: 10 U/L (ref 6–29)
AST: 18 U/L (ref 10–35)
Alkaline phosphatase (APISO): 87 U/L (ref 33–130)
BUN / CREAT RATIO: 18 (calc) (ref 6–22)
BUN: 26 mg/dL — ABNORMAL HIGH (ref 7–25)
CALCIUM: 9.2 mg/dL (ref 8.6–10.4)
CO2: 27 mmol/L (ref 20–32)
Chloride: 105 mmol/L (ref 98–110)
Creat: 1.48 mg/dL — ABNORMAL HIGH (ref 0.60–0.93)
GFR, EST AFRICAN AMERICAN: 39 mL/min/{1.73_m2} — AB (ref >=60)
GFR, EST NON AFRICAN AMERICAN: 34 mL/min/{1.73_m2} — AB (ref >=60)
GLOBULIN: 3.5 g/dL (ref 1.9–3.7)
Glucose, Bld: 106 mg/dL — ABNORMAL HIGH (ref 65–99)
Potassium: 4.4 mmol/L (ref 3.5–5.3)
SODIUM: 141 mmol/L (ref 135–146)
TOTAL PROTEIN: 7.2 g/dL (ref 6.1–8.1)
Total Bilirubin: 0.6 mg/dL (ref 0.2–1.2)

## 2018-04-26 NOTE — Progress Notes (Signed)
Labs are stable.

## 2018-05-06 ENCOUNTER — Ambulatory Visit: Payer: PPO | Admitting: Rheumatology

## 2018-05-10 NOTE — Progress Notes (Signed)
Office Visit Note  Patient: Laura Chambers             Date of Birth: 20-Oct-1940           MRN: [id number]             PCP: Briscoe Deutscher, DO Referring: Briscoe Deutscher, DO Visit Date: 05/13/2018 Occupation: @GUAROCC @  Subjective:  Right shoulder pain   History of Present Illness: LANORA REVERON is a 77 y.o. female history of seropositive rheumatoid arthritis and osteoarthritis.  She is taking PLQ 200 mg 1 tablet by mouth daily, and she is tolerating it well.  She states the GI side effects have resolved.  She restarted on 03/25/18.  She continues to have pain and swelling in both hands. She feels like PLQ has started to be effective.  She is able to make a complete fist. She is having right shoulder pain and neck pain.  She has limited ROM of the right shoulder joint.    Activities of Daily Living:  Patient reports morning stiffness all day.   Patient Denies nocturnal pain.  Difficulty dressing/grooming: Reports Difficulty climbing stairs: Reports Difficulty getting out of chair: Denies Difficulty using hands for taps, buttons, cutlery, and/or writing: Reports  Review of Systems  Constitutional: Positive for fatigue.  HENT: Positive for mouth dryness. Negative for mouth sores and nose dryness.   Eyes: Positive for dryness. Negative for pain and visual disturbance.  Respiratory: Negative for cough, hemoptysis, shortness of breath and difficulty breathing.   Cardiovascular: Positive for swelling in legs/feet. Negative for chest pain, palpitations and hypertension.  Gastrointestinal: Positive for diarrhea. Negative for blood in stool and constipation.  Endocrine: Negative for increased urination.  Genitourinary: Negative for difficulty urinating and painful urination.  Musculoskeletal: Positive for arthralgias, joint pain, joint swelling, morning stiffness and muscle tenderness. Negative for myalgias, muscle weakness and myalgias.  Skin: Negative for color change, pallor,  rash, hair loss, nodules/bumps, skin tightness, ulcers and sensitivity to sunlight.  Allergic/Immunologic: Negative for susceptible to infections.  Neurological: Negative for dizziness and headaches.  Hematological: Negative for bruising/bleeding tendency and swollen glands.  Psychiatric/Behavioral: Positive for sleep disturbance. Negative for depressed mood. The patient is not nervous/anxious.     PMFS History:  Patient Active Problem List   Diagnosis Date Noted  . Hx of peripheral neuropathy 12/15/2017  . Hx of colonic polyps 12/15/2017  . History of depression 12/15/2017  . History of anxiety 12/15/2017  . 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 Chronic kidney disease (CKD), active medical management without dialysis, stage 3 (moderate) ([place]) 12/15/2017  . Fibromyalgia 12/15/2017  . History of total right hip replacement 12/15/2017  . DDD (degenerative disc disease), lumbar 12/15/2017  . 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 Left foot drop 12/15/2017  . Pain in both hands 09/25/2017  . 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 Status post revision of total knee, left 06/09/2017  . Obesity, Class III, BMI 40-49.9 (morbid obesity) ([place]) 12/28/2016  . Anemia 12/16/2016  . AKI (acute kidney injury) ([place]) 12/15/2016  . Essential hypertension 12/08/2016  . 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 Knee pain, left 11/11/2016  . Pain in left leg 11/10/2016  . Musculoskeletal neck pain 08/19/2015  . Peripheral neuropathy 01/12/2014  . Hyperglycemia 06/16/2012  . 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 Dyslipidemia 06/16/2012  . Globus sensation 02/04/2012  . Nausea 02/04/2012  . Bowel habit changes 02/04/2012    Past Medical History:  Diagnosis Date  . Anemia   . Anxiety   . Arthritis   . Atelectasis, left    left lower lung  . Blood transfusion without reported diagnosis   . Cataract cortical, senile, bilateral 03/19/2018   . Complication of anesthesia 2012   "irritated trachea" with cough x 1 year from intubation 2012.  smaller tube used with other surgeries  . Depression    just lost her son a month ago  . Esophageal hernia   . Fibromyalgia   . Headache    occasionally  . HTN (hypertension)   . Hx of colonic polyps   . Hydronephrosis of right kidney   . Hypercholesterolemia   . Hypertensive retinopathy of both eyes 03/19/2018  . Neuropathy due to medical condition ([place])    bilateral lower legs  . Obesity   . 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 Osteoporosis   . Renal angiomyolipoma   . Rheumatoid arthritis ([place])     Family History  Problem Relation Age of Onset  . Diabetes Father   . Osteoarthritis Father   . Heart disease Father   . Stroke Father   . Osteoarthritis Mother   . Stroke Mother   . Pneumonia Mother   . Colon cancer Sister 74  . Colon polyps Unknown        neice/nephew  . Diabetes Sister   . Diabetes Brother    Past Surgical History:  Procedure Laterality Date  . ABDOMINAL HYSTERECTOMY  1969  . BACK SURGERY  2012   rod in back-  lumbar disc removal with bone graft  . blephoroplasty     bilateral  . BREAST BIOPSY  1989  . CHOLECYSTECTOMY  2010  . EXCISIONAL TOTAL KNEE ARTHROPLASTY WITH ANTIBIOTIC SPACERS Left 11/14/2016   Procedure: EXCISIONAL LEFT TOTAL KNEE ARTHROPLASTY WITH  PLACEMENT OF ANTIBIOTIC SPACERS;  Surgeon: [name], MD;  Location: [facility];  Service: Orthopedics;  Laterality: Left;  . JOINT REPLACEMENT    . NISSEN FUNDOPLICATION    . PARAESOPHAGEAL HERNIA REPAIR  1989  . REVISION TOTAL KNEE ARTHROPLASTY Left 06/09/2017  . TONSILLECTOMY  1946  . TOTAL HIP ARTHROPLASTY Right 08/31/2012   Procedure: TOTAL HIP ARTHROPLASTY ANTERIOR APPROACH;  Surgeon: Mauri Pole, MD;  Location[facility];  Service: Orthopedics;  Laterality: Right;  . TOTAL KNEE ARTHROPLASTY  2001   left  . TOTAL KNEE ARTHROPLASTY  2010   right  .  TOTAL KNEE REVISION Left 06/09/2017   Procedure: LEFT REVISION KNEE ARTHROPLASTY;  Surgeon: [name], MD;  Location: [facility];  Service: Orthopedics;  Laterality: Left;   Social History   Social History Narrative   Patient lives at home alone.   Caffeine Use: 1 cup daily   Immunization History  Administered Date(s) Administered  . Influenza, High Dose Seasonal PF 03/27/2018  . Influenza,inj,Quad PF,6+ Mos 03/13/2017  . Influenza-Unspecified 03/26/2018  . Pneumococcal Conjugate-13 02/26/2018   Objective: Vital Signs: BP 114/65 (BP Location: Left Arm, Patient Position: Sitting, Cuff Size: Normal)   Pulse 67   Resp 18   Ht 5\' 6"  (1.676 m)   Wt 193 lb (87.5 kg)   BMI 31.15 kg/m    Physical Exam  Constitutional: She is oriented to person, place, and time. She appears well-developed and well-nourished.  HENT:  Head: Normocephalic and atraumatic.  Eyes: Conjunctivae and EOM are normal.  Neck: Normal range of motion.  Cardiovascular: Normal rate, regular rhythm, normal heart sounds  and intact distal pulses.  Pulmonary/Chest: Effort normal and breath sounds normal.  Abdominal: Soft. Bowel sounds are normal.  Lymphadenopathy:    She has no cervical adenopathy.  Neurological: She is alert and oriented to person, place, and time.  Skin: Skin is warm and dry. Capillary refill takes less than 2 seconds.  Psychiatric: She has a normal mood and affect. Her behavior is normal.  Nursing note and vitals reviewed.    Musculoskeletal Exam: C-spine limited ROM with lateral rotation, worse when looking left. Right shoulder limited active ROM to 140 degrees abduction with discomfort.  Left shoulder full ROM with no discomfort.  Elbow joints good ROM with no tenderness or swelling. Right wrist synovitis.  Synovitis of right 2nd and 5th PIPs and left 1st, 2nd, and 4th PIP and left 3rd MCP. Hip joints, knee joints, ankle joints, MTPs, PIPs, and DIPs good ROM with no synovitis. Right knee  warmth.  Right ankle swelling and tenderness.   CDAI Exam: CDAI Score: 15  Patient Global Assessment: 5 (mm); Provider Global Assessment: 5 (mm) Swollen: 8 ; Tender: 8  Joint Exam      Right  Left  Wrist  Swollen Tender     MCP 3     Swollen Tender  IP     Swollen Tender  PIP 2  Swollen Tender  Swollen Tender  PIP 4     Swollen Tender  PIP 5  Swollen Tender     Ankle  Swollen Tender        Investigation: No additional findings.  Imaging: No results found.  Recent Labs: Lab Results  Component Value Date   WBC 9.5 04/23/2018   HGB 11.2 (L) 04/23/2018   PLT 291 04/23/2018   NA 141 04/23/2018   K 4.4 04/23/2018   CL 105 04/23/2018   CO2 27 04/23/2018   GLUCOSE 106 (H) 04/23/2018   BUN 26 (H) 04/23/2018   CREATININE 1.48 (H) 04/23/2018   BILITOT 0.6 04/23/2018   ALKPHOS 93 02/26/2018   AST 18 04/23/2018   ALT 10 04/23/2018   PROT 7.2 04/23/2018   ALBUMIN 4.0 02/26/2018   CALCIUM 9.2 04/23/2018   GFRAA 39 (L) 04/23/2018    Speciality Comments: PLQ Eye Exam: 03/19/18 WNL @ [facility] Exam follow up in 6 months.   Procedures:  No procedures performed Allergies: Ativan [lorazepam]; Haldol [haloperidol]; Epinephrine; Levofloxacin; Lipitor [atorvastatin]; and Trovan [alatrofloxacin]      Recommend Pneumovax 23 and Shingrix as indicated.  Assessment / Plan:     Visit Diagnoses: Rheumatoid arthritis involving multiple sites with positive rheumatoid factor ([place]): She continues to have active synovitis and joint tenderness as described above.  She is having right shoulder pain and limited ROM, but she declined a x-ray or cortisone injection today.  She was restarted on PLQ 200 mg 1 tablet by mouth daily on 03/25/18.  Initially, she was experiencing GI SE, but she is tolerating PLQ now. She has noticed continual benefit since restarting on PLQ.  We discussed adding on Arava or Enbrel due to still having active synovitis on exam, but she declined.  We reviewed information  about Enbrel today in the office.  She was given a handout with information about Enbrel to review. She will follow up in 2 months.  She was given a prednisone taper starting at 10 mg and tapering by 2.5 mg every week.  She was advised to notify us if she develops increased joint pain or worsening joint swelling.  We  reminded her to avoid NSAIDs due to low GFR.   High risk medication use - PLQ 200 mg 1 tablet by mouth daily (Low GFR)-initially experienced GI SE which have resolved.  She continues to have active synovitis but she does not want to add on another medication at this time. Labs were stable on 04/23/18. She will have lab work drawn at her next visit in 2 months.   Chronic right shoulder pain - She has been having increased right shoulder pain.  She has limited abduction to 140 degrees with discomfort.  She declined a shoulder x-ray or cortisone injection today.   History of total right hip replacement: Doing well.  She has good ROM with no discomfort.   S/P total knee replacement, right: Chronic pain. She occasionally walks with a cane. She has warmth of the right knee on exam.   Status post revision of total knee, left: She has no warmth or discomfort at this time.   Fibromyalgia: She continues to have generalized muscle aches and muscle tenderness due to fibromyalgia.   Infected prosthetic knee joint, sequela: Chronic pain. Right knee warmth on exam.   DDD (degenerative disc disease), lumbar: No midline spinal tenderness.  She has no discomfort at this time.   Other medical conditions are listed as follows:   Left foot drop  Chronic kidney disease (CKD), active medical management without dialysis, stage 3 (moderate) ([place])  Dyslipidemia  Essential hypertension  Hx of colonic polyps  Hx of peripheral neuropathy  History of depression  History of anxiety  Renal angiomyolipoma   Orders: No orders of the defined types were placed in this encounter.  Meds ordered this  encounter  Medications  . predniSONE (DELTASONE) 5 MG tablet    Sig: Take 2 tablets by mouth daily x1 week, 1.5 tablets by mouth x1wk, 1 tablet by mouth x1wk, 1/2 tablet by mouth x1 wk    Dispense:  35 tablet    Refill:  0    Face-to-face time spent with patient was 30 minutes. Greater than 50% of time was spent in counseling and coordination of care.  Follow-Up Instructions: Return in about 2 months (around 07/13/2018) for Rheumatoid arthritis, Fibromyalgia, DDD.   Hazel Sams PA-C  I examined and evaluated the patient with Hazel Sams PA.  Patient continues to have active synovitis in her joints on my exam.  Although she feels her symptoms are improving.  We had detailed discussion regarding different treatment options.  At this point she wants to continue on Plaquenil.  The plan of care was discussed as noted above.  Bo Merino, MD  Note - This record has been created using Editor, commissioning.  Chart creation errors have been sought, but may not always  have been located. Such creation errors do not reflect on  the standard of medical care.

## 2018-05-13 ENCOUNTER — Encounter: Payer: Self-pay | Admitting: Rheumatology

## 2018-05-13 ENCOUNTER — Ambulatory Visit: Payer: PPO | Admitting: Rheumatology

## 2018-05-13 ENCOUNTER — Ambulatory Visit (INDEPENDENT_AMBULATORY_CARE_PROVIDER_SITE_OTHER): Payer: Self-pay

## 2018-05-13 VITALS — BP 114/65 | HR 67 | Resp 18 | Ht 66.0 in | Wt 193.0 lb

## 2018-05-13 DIAGNOSIS — T8459XS Infection and inflammatory reaction due to other internal joint prosthesis, sequela: Secondary | ICD-10-CM | POA: Diagnosis not present

## 2018-05-13 DIAGNOSIS — Z8669 Personal history of other diseases of the nervous system and sense organs: Secondary | ICD-10-CM

## 2018-05-13 DIAGNOSIS — M0579 Rheumatoid arthritis with rheumatoid factor of multiple sites without organ or systems involvement: Secondary | ICD-10-CM | POA: Diagnosis not present

## 2018-05-13 DIAGNOSIS — M797 Fibromyalgia: Secondary | ICD-10-CM | POA: Diagnosis not present

## 2018-05-13 DIAGNOSIS — Z79899 Other long term (current) drug therapy: Secondary | ICD-10-CM | POA: Diagnosis not present

## 2018-05-13 DIAGNOSIS — N183 Chronic kidney disease, stage 3 unspecified: Secondary | ICD-10-CM

## 2018-05-13 DIAGNOSIS — Z96651 Presence of right artificial knee joint: Secondary | ICD-10-CM

## 2018-05-13 DIAGNOSIS — E785 Hyperlipidemia, unspecified: Secondary | ICD-10-CM | POA: Diagnosis not present

## 2018-05-13 DIAGNOSIS — Z96641 Presence of right artificial hip joint: Secondary | ICD-10-CM

## 2018-05-13 DIAGNOSIS — Z96652 Presence of left artificial knee joint: Secondary | ICD-10-CM

## 2018-05-13 DIAGNOSIS — M21372 Foot drop, left foot: Secondary | ICD-10-CM

## 2018-05-13 DIAGNOSIS — I10 Essential (primary) hypertension: Secondary | ICD-10-CM

## 2018-05-13 DIAGNOSIS — M5136 Other intervertebral disc degeneration, lumbar region: Secondary | ICD-10-CM

## 2018-05-13 DIAGNOSIS — D1771 Benign lipomatous neoplasm of kidney: Secondary | ICD-10-CM

## 2018-05-13 DIAGNOSIS — M25511 Pain in right shoulder: Secondary | ICD-10-CM | POA: Diagnosis not present

## 2018-05-13 DIAGNOSIS — G8929 Other chronic pain: Secondary | ICD-10-CM

## 2018-05-13 DIAGNOSIS — Z8659 Personal history of other mental and behavioral disorders: Secondary | ICD-10-CM

## 2018-05-13 DIAGNOSIS — Z96659 Presence of unspecified artificial knee joint: Secondary | ICD-10-CM

## 2018-05-13 DIAGNOSIS — Z86010 Personal history of colonic polyps: Secondary | ICD-10-CM

## 2018-05-13 MED ORDER — PREDNISONE 5 MG PO TABS: ORAL_TABLET | ORAL | 0 refills | Status: DC

## 2018-05-13 NOTE — Patient Instructions (Signed)
Etanercept injection What is this medicine? ETANERCEPT (et a [employer]) is used for the treatment of rheumatoid arthritis in adults and children. The medicine is also used to treat psoriatic arthritis, ankylosing spondylitis, and psoriasis. This medicine may be used for other purposes; ask your health care provider or pharmacist if you have questions. COMMON BRAND NAME(S): Enbrel What should I tell my health care provider before I take this medicine? They need to know if you have any of these conditions: -blood disorders -cancer -congestive heart failure -diabetes -exposure to chickenpox -immune system problems -infection -multiple sclerosis -seizure disorder -tuberculosis, a positive skin test for tuberculosis or have recently been in close contact with someone who has tuberculosis -Wegener's granulomatosis -an unusual or allergic reaction to etanercept, latex, other medicines, foods, dyes, or preservatives -pregnant or trying to get pregnant -breast-feeding How should I use this medicine? The medicine is given by injection under the skin. You will be taught how to prepare and give this medicine. Use exactly as directed. Take your medicine at regular intervals. Do not take your medicine more often than directed. It is important that you put your used needles and syringes in a special sharps container. Do not put them in a trash can. If you do not have a sharps container, call your pharmacist or healthcare provider to get one. A special MedGuide will be given to you by the pharmacist with each prescription and refill. Be sure to read this information carefully each time. Talk to your pediatrician regarding the use of this medicine in children. While this drug may be prescribed for children as young as 56 years of age for selected conditions, precautions do apply. Overdosage: If you think you have taken too much of this medicine contact a poison control center or emergency room at once. NOTE:  This medicine is only for you. Do not share this medicine with others. What if I miss a dose? If you miss a dose, contact your health care professional to find out when you should take your next dose. Do not take double or extra doses without advice. What may interact with this medicine? Do not take this medicine with any of the following medications: -anakinra This medicine may also interact with the following medications: -cyclophosphamide -sulfasalazine -vaccines This list may not describe all possible interactions. Give your health care provider a list of all the medicines, herbs, non-prescription drugs, or dietary supplements you use. Also tell them if you smoke, drink alcohol, or use illegal drugs. Some items may interact with your medicine. What should I watch for while using this medicine? Tell your doctor or healthcare professional if your symptoms do not start to get better or if they get worse. You will be tested for tuberculosis (TB) before you start this medicine. If your doctor prescribes any medicine for TB, you should start taking the TB medicine before starting this medicine. Make sure to finish the full course of TB medicine. Call your doctor or health care professional for advice if you get a fever, chills or sore throat, or other symptoms of a cold or flu. Do not treat yourself. This drug decreases your body's ability to fight infections. Try to avoid being around people who are sick. What side effects may I notice from receiving this medicine? Side effects that you should report to your doctor or health care professional as soon as possible: -allergic reactions like skin rash, itching or hives, swelling of the face, lips, or tongue -changes in vision -fever, chills  or any other sign of infection -numbness or tingling in legs or other parts of the body -red, scaly patches or raised bumps on the skin -shortness of breath or difficulty breathing -swollen lymph nodes in the  neck, underarm, or groin areas -unexplained weight loss -unusual bleeding or bruising -unusual swelling or fluid retention in the legs -unusually weak or tired Side effects that usually do not require medical attention (report to your doctor or health care professional if they continue or are bothersome): -dizziness -headache -nausea -redness, itching, or swelling at the injection site -vomiting This list may not describe all possible side effects. Call your doctor for medical advice about side effects. You may report side effects to FDA at 1-800-FDA-1088. Where should I keep my medicine? Keep out of the reach of children. Store between 2 and 8 degrees C (36 and 46 degrees F). Do not freeze or shake. Protect from light. Throw away any unused medicine after the expiration date. You will be instructed on how to store this medicine. NOTE: This sheet is a summary. It may not cover all possible information. If you have questions about this medicine, talk to your doctor, pharmacist, or health care provider.  2018 Elsevier/Gold Standard (2011-12-29 15:33:36)

## 2018-05-18 ENCOUNTER — Other Ambulatory Visit: Payer: Self-pay | Admitting: Rheumatology

## 2018-05-18 NOTE — Telephone Encounter (Signed)
Last Visit: 05/13/18 Next Visit: 07/22/18 Labs: 04/23/18 stable PLQ Eye Exam: 03/19/18 WNL   Okay to refill per Dr. Estanislado Pandy

## 2018-05-24 ENCOUNTER — Other Ambulatory Visit: Payer: Self-pay | Admitting: Family Medicine

## 2018-05-24 DIAGNOSIS — E785 Hyperlipidemia, unspecified: Secondary | ICD-10-CM

## 2018-06-15 ENCOUNTER — Other Ambulatory Visit: Payer: Self-pay | Admitting: Family Medicine

## 2018-07-08 NOTE — Progress Notes (Signed)
Office Visit Note  Patient: Laura Chambers             Date of Birth: [birth year]           MRN: [id number]             PCP: Briscoe Deutscher, DO Referring: Briscoe Deutscher, DO Visit Date: 07/22/2018 Occupation: @GUAROCC @  Subjective:  Bilateral hand stiffness    History of Present Illness: Laura Chambers is a 78 y.o. female with history of seropositive rheumatoid arthritis and fibromyalgia.  She is on PLQ 200 mg 1 tablet by mouth daily. She was on a prednisone taper starting at 10 mg and tapering by 2.5 mg every week in November 2019.  She has noticed significant improvement in joint pain and joint swelling.  She states she continues to have stiffness in both hands in the morning for 15 minutes.  She has some swelling and discomfort in both wrist joints.  She states she is finally able to wear her rings and has no difficulty with ADLs.  She states her fibromyalgia has been well controlled lately.    Activities of Daily Living:  Patient reports morning stiffness for 15 minutes.   Patient Denies nocturnal pain.  Difficulty dressing/grooming: Denies Difficulty climbing stairs: Reports Difficulty getting out of chair: Reports Difficulty using hands for taps, buttons, cutlery, and/or writing: Denies  Review of Systems  Constitutional: Positive for fatigue.  HENT: Negative for mouth sores, mouth dryness and nose dryness.   Eyes: Positive for dryness. Negative for pain and visual disturbance.  Respiratory: Negative for cough, hemoptysis, shortness of breath and difficulty breathing.   Cardiovascular: Negative for chest pain, palpitations, hypertension and swelling in legs/feet.  Gastrointestinal: Negative for blood in stool, constipation and diarrhea.  Endocrine: Negative for increased urination.  Genitourinary: Negative for painful urination.  Musculoskeletal: Positive for arthralgias, joint pain and joint swelling. Negative for myalgias, muscle weakness, morning stiffness, muscle  tenderness and myalgias.  Skin: Negative for color change, pallor, rash, hair loss, nodules/bumps, skin tightness, ulcers and sensitivity to sunlight.  Allergic/Immunologic: Negative for susceptible to infections.  Neurological: Negative for dizziness, numbness, headaches and weakness.  Hematological: Negative for swollen glands.  Psychiatric/Behavioral: Negative for depressed mood and sleep disturbance. The patient is not nervous/anxious.     PMFS History:  Patient Active Problem List   Diagnosis Date Noted  . Hx of peripheral neuropathy 12/15/2017  . Hx of colonic polyps 12/15/2017  . History of depression 12/15/2017  . History of anxiety 12/15/2017  . 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 Chronic kidney disease (CKD), active medical management without dialysis, stage 3 (moderate) ([place]) 12/15/2017  . Fibromyalgia 12/15/2017  . History of total right hip replacement 12/15/2017  . DDD (degenerative disc disease), lumbar 12/15/2017  . 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 Left foot drop 12/15/2017  . Pain in both hands 09/25/2017  . 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 Status post revision of total knee, left 06/09/2017  . Obesity, Class III, BMI 40-49.9 (morbid obesity) ([place]) 12/28/2016  . Anemia 12/16/2016  . AKI (acute kidney injury) ([place]) 12/15/2016  . Essential hypertension 12/08/2016  . 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 Knee pain, left 11/11/2016  . Pain in left leg 11/10/2016  . Musculoskeletal neck pain 08/19/2015  . Peripheral neuropathy 01/12/2014  . Hyperglycemia 06/16/2012  . 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 Dyslipidemia 06/16/2012  . Globus sensation 02/04/2012  . Nausea 02/04/2012  .  Bowel habit changes 02/04/2012    Past Medical History:  Diagnosis Date  . Anemia   . Anxiety   . Arthritis   . Atelectasis, left    left lower lung  . Blood transfusion without reported diagnosis   .  Cataract cortical, senile, bilateral 03/19/2018  . Complication of anesthesia 2012   "irritated trachea" with cough x 1 year from intubation 2012.  smaller tube used with other surgeries  . Depression    just lost her son a month ago  . Esophageal hernia   . Fibromyalgia   . Headache    occasionally  . HTN (hypertension)   . Hx of colonic polyps   . Hydronephrosis of right kidney   . Hypercholesterolemia   . Hypertensive retinopathy of both eyes 03/19/2018  . Neuropathy due to medical condition ([place])    bilateral lower legs  . Obesity   . 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 Osteoporosis   . Renal angiomyolipoma   . Rheumatoid arthritis ([place])     Family History  Problem Relation Age of Onset  . Diabetes Father   . Osteoarthritis Father   . Heart disease Father   . Stroke Father   . Osteoarthritis Mother   . Stroke Mother   . Pneumonia Mother   . Colon cancer Sister 70  . Colon polyps Other        neice/nephew  . Diabetes Sister   . Diabetes Brother    Past Surgical History:  Procedure Laterality Date  . ABDOMINAL HYSTERECTOMY  1969  . BACK SURGERY  2012   rod in back-  lumbar disc removal with bone graft  . blephoroplasty     bilateral  . BREAST BIOPSY  1989  . CHOLECYSTECTOMY  2010  . EXCISIONAL TOTAL KNEE ARTHROPLASTY WITH ANTIBIOTIC SPACERS Left 11/14/2016   Procedure: EXCISIONAL LEFT TOTAL KNEE ARTHROPLASTY WITH  PLACEMENT OF ANTIBIOTIC SPACERS;  Surgeon: [name], MD;  Location: [facility];  Service: Orthopedics;  Laterality: Left;  . JOINT REPLACEMENT    . NISSEN FUNDOPLICATION    . PARAESOPHAGEAL HERNIA REPAIR  1989  . REVISION TOTAL KNEE ARTHROPLASTY Left 06/09/2017  . TONSILLECTOMY  1946  . TOTAL HIP ARTHROPLASTY Right 08/31/2012   Procedure: TOTAL HIP ARTHROPLASTY ANTERIOR APPROACH;  Surgeon: Mauri Pole, MD;  Location: [facility];  Service: Orthopedics;  Laterality: Right;  . TOTAL KNEE ARTHROPLASTY  2001   left  .  TOTAL KNEE ARTHROPLASTY  2010   right  . TOTAL KNEE REVISION Left 06/09/2017   Procedure: LEFT REVISION KNEE ARTHROPLASTY;  Surgeon: [name], MD;  Location: [facility];  Service: Orthopedics;  Laterality: Left;   Social History   Social History Narrative   Patient lives at home alone.   Caffeine Use: 1 cup daily   Immunization History  Administered Date(s) Administered  . Influenza, High Dose Seasonal PF 03/27/2018  . Influenza,inj,Quad PF,6+ Mos 03/13/2017  . Influenza-Unspecified 03/26/2018  . Pneumococcal Conjugate-13 02/26/2018   Objective: Vital Signs: BP 119/72 (BP Location: Left Arm, Patient Position: Sitting, Cuff Size: Normal)   Pulse 71   Resp 14   Ht 5\' 6"  (1.676 m)   Wt 199 lb 6.4 oz (90.4 kg)   BMI 32.18 kg/m    Physical Exam Vitals signs and nursing note reviewed.  Constitutional:      Appearance: She is well-developed.  HENT:     Head: Normocephalic and atraumatic.  Eyes:  Conjunctiva/sclera: Conjunctivae normal.  Neck:     Musculoskeletal: Normal range of motion.  Cardiovascular:     Rate and Rhythm: Normal rate and regular rhythm.     Heart sounds: Normal heart sounds.  Pulmonary:     Effort: Pulmonary effort is normal.     Breath sounds: Normal breath sounds.  Abdominal:     General: Bowel sounds are normal.     Palpations: Abdomen is soft.  Lymphadenopathy:     Cervical: No cervical adenopathy.  Skin:    General: Skin is warm and dry.     Capillary Refill: Capillary refill takes less than 2 seconds.  Neurological:     Mental Status: She is alert and oriented to person, place, and time.  Psychiatric:        Behavior: Behavior normal.      Musculoskeletal Exam: C-spine limited ROM with lateral rotation.   Thoracic and lumbar spine limited ROM.  No midline spinal tenderness.  No SI joint tenderness.  Full ROM of both shoulder joints.  Elbow joints good range of motion with no discomfort.  No tenderness or synovitis of elbow  joints.  She has synovitis of bilateral wrist joints worse on the right.  MCPs, PIPs, DIPs good range of motion with no synovitis.  She has complete fist formation bilaterally.  Hip joints, knee joints, ankle joints, MTPs, PIPs, DIPs good range of motion.  She has warmth of bilateral knee replacements.  She has some tenderness of the right ankle joint on exam.  No tenderness of MTP joints.  No tenderness over trochanteric bursa bilaterally.  CDAI Exam: CDAI Score: 4.6  Patient Global Assessment: 3 (mm); Provider Global Assessment: 3 (mm) Swollen: 2 ; Tender: 3  Joint Exam      Right  Left  Wrist  Swollen Tender  Swollen Tender  Ankle   Tender        Investigation: No additional findings.  Imaging: No results found.  Recent Labs: Lab Results  Component Value Date   WBC 9.5 04/23/2018   HGB 11.2 (L) 04/23/2018   PLT 291 04/23/2018   NA 141 04/23/2018   K 4.4 04/23/2018   CL 105 04/23/2018   CO2 27 04/23/2018   GLUCOSE 106 (H) 04/23/2018   BUN 26 (H) 04/23/2018   CREATININE 1.48 (H) 04/23/2018   BILITOT 0.6 04/23/2018   ALKPHOS 93 02/26/2018   AST 18 04/23/2018   ALT 10 04/23/2018   PROT 7.2 04/23/2018   ALBUMIN 4.0 02/26/2018   CALCIUM 9.2 04/23/2018   GFRAA 39 (L) 04/23/2018    Speciality Comments: PLQ Eye Exam: 03/19/18 WNL @ [facility] Exam follow up in 6 months.   Procedures:  No procedures performed Allergies: Ativan [lorazepam]; Haldol [haloperidol]; Epinephrine; Levofloxacin; Lipitor [atorvastatin]; and Trovan [alatrofloxacin]    Assessment / Plan:     Visit Diagnoses: Rheumatoid arthritis involving multiple sites with positive rheumatoid factor ([place]): She has synovitis and tenderness of both wrist joints.  She continues to notice improvement while on Plaquenil.  She has been taking Plaquenil 200 mg 1 tablet by mouth daily since 03/15/2018.  She was started on a prednisone taper in November starting at 10 mg and tapering by 2.5 mg every week.  Her joint pain and  joint swelling resolved while on prednisone.  She has morning stiffness lasting about 15 minutes in both hands and wrists.  Her stiffness has improved over the past 2 months.  She is having some discomfort in bilateral wrist joints,  right shoulder, and right ankle joint.  She feels as though Plaquenil has been effective and does not want to add any other medications at this time.  Enbrel was reviewed with her at her last visit but she declined.  She was reminded to continue to avoid NSAIDs due to low GFR.  She was advised to notify us that shows increased joint pain or joint swelling.  She will follow-up in the office in 5 months.  High risk medication use - PLQ 200 mg 1 tablet by mouth daily (Low GFR)-initially experienced GI SE which have resolved.Eye Exam: 03/19/18.  Most recent CBC/CMP stable on 04/23/2018 and will monitor every 5 months.  Standing orders are in place.  Patient received annual flu vaccine in September and Prevnar 13 in August.   History of total right hip replacement: Doing well.  She has no discomfort at this time.   S/P total knee replacement, right: She has warmth on exam.  She has good ROM with no discomfort.  She walks with a cane.   Status post revision of total knee, left: She has warmth on exam. She has good ROM on exam.  She has not seen Dr. Ninfa Linden recently, but she is planning on scheduling a follow up visit.   Fibromyalgia: Her fibromyalgia has been well controlled recently.  She has very minimal muscle tenderness and muscle aches at this time.  Her fatigue level has been stable recently.  Infected prosthetic knee joint, sequela  DDD (degenerative disc disease), lumbar: She has no midline spinal tenderness.    Left foot drop: She walks with a cane.   Chronic kidney disease (CKD), active medical management without dialysis, stage 3 (moderate) ([place]): She was reminded to continue to avoid NSAIDs due to low GFR.  We will continue to monitor lab work closely.  We will  not increase her dose of Plaquenil at this time.  Other medical conditions are listed as follows:  Renal angiomyolipoma  History of depression  History of anxiety  Hx of peripheral neuropathy  Dyslipidemia  Essential hypertension  Hx of colonic polyps   Orders: No orders of the defined types were placed in this encounter.  Meds ordered this encounter  Medications  . hydroxychloroquine (PLAQUENIL) 200 MG tablet    Sig: Take 1 tablet (200 mg total) by mouth daily.    Dispense:  90 tablet    Refill:  0     Follow-Up Instructions: Return in about 5 months (around 12/21/2018) for Rheumatoid arthritis, Fibromyalgia.   Hazel Sams PA-C I examined and evaluated the patient with Hazel Sams PA.  Patient continues to have some synovial thickening and tenderness in her wrist joints.  She had no synovitis over MCP joints on my examination today.  She will continue on current regimen.  Due to multiple medical problems she does not want to be on more aggressive therapy.  The plan of care was discussed as noted above.  Bo Merino, MD  Note - This record has been created using Editor, commissioning.  Chart creation errors have been sought, but may not always  have been located. Such creation errors do not reflect on  the standard of medical care.

## 2018-07-16 ENCOUNTER — Other Ambulatory Visit: Payer: Self-pay | Admitting: Rheumatology

## 2018-07-22 ENCOUNTER — Ambulatory Visit: Payer: PPO | Admitting: Rheumatology

## 2018-07-22 ENCOUNTER — Encounter: Payer: Self-pay | Admitting: Rheumatology

## 2018-07-22 VITALS — BP 119/72 | HR 71 | Resp 14 | Ht 66.0 in | Wt 199.4 lb

## 2018-07-22 DIAGNOSIS — M21372 Foot drop, left foot: Secondary | ICD-10-CM | POA: Diagnosis not present

## 2018-07-22 DIAGNOSIS — Z96652 Presence of left artificial knee joint: Secondary | ICD-10-CM | POA: Diagnosis not present

## 2018-07-22 DIAGNOSIS — N183 Chronic kidney disease, stage 3 unspecified: Secondary | ICD-10-CM

## 2018-07-22 DIAGNOSIS — Z79899 Other long term (current) drug therapy: Secondary | ICD-10-CM

## 2018-07-22 DIAGNOSIS — Z8659 Personal history of other mental and behavioral disorders: Secondary | ICD-10-CM

## 2018-07-22 DIAGNOSIS — Z96651 Presence of right artificial knee joint: Secondary | ICD-10-CM | POA: Diagnosis not present

## 2018-07-22 DIAGNOSIS — M5136 Other intervertebral disc degeneration, lumbar region: Secondary | ICD-10-CM

## 2018-07-22 DIAGNOSIS — I10 Essential (primary) hypertension: Secondary | ICD-10-CM

## 2018-07-22 DIAGNOSIS — Z8669 Personal history of other diseases of the nervous system and sense organs: Secondary | ICD-10-CM

## 2018-07-22 DIAGNOSIS — T8459XS Infection and inflammatory reaction due to other internal joint prosthesis, sequela: Secondary | ICD-10-CM

## 2018-07-22 DIAGNOSIS — M0579 Rheumatoid arthritis with rheumatoid factor of multiple sites without organ or systems involvement: Secondary | ICD-10-CM | POA: Diagnosis not present

## 2018-07-22 DIAGNOSIS — M797 Fibromyalgia: Secondary | ICD-10-CM

## 2018-07-22 DIAGNOSIS — D1771 Benign lipomatous neoplasm of kidney: Secondary | ICD-10-CM | POA: Diagnosis not present

## 2018-07-22 DIAGNOSIS — M51369 Other intervertebral disc degeneration, lumbar region without mention of lumbar back pain or lower extremity pain: Secondary | ICD-10-CM

## 2018-07-22 DIAGNOSIS — Z860100 Personal history of colon polyps, unspecified: Secondary | ICD-10-CM

## 2018-07-22 DIAGNOSIS — Z96641 Presence of right artificial hip joint: Secondary | ICD-10-CM

## 2018-07-22 DIAGNOSIS — E785 Hyperlipidemia, unspecified: Secondary | ICD-10-CM

## 2018-07-22 DIAGNOSIS — Z96659 Presence of unspecified artificial knee joint: Secondary | ICD-10-CM

## 2018-07-22 MED ORDER — HYDROXYCHLOROQUINE SULFATE 200 MG PO TABS: 200.0000 mg | ORAL_TABLET | Freq: Every day | ORAL | 0 refills | Status: DC

## 2018-07-22 NOTE — Patient Instructions (Signed)
Standing Labs We placed an order today for your standing lab work.    Please come back and get your standing labs in March and every 5 months   We have open lab Monday through Friday from 8:30-11:30 AM and 1:30-4:00 PM  at the office of Dr. Shaili Deveshwar.   You may experience shorter wait times on Monday and Friday afternoons. The office is located at [address], [address], [place], [place] [address] No appointment is necessary.   Labs are drawn by [facility].  You may receive a bill from [facility] for your lab work.  If you wish to have your labs drawn at another location, please call the office 24 hours in advance to send orders.  If you have any questions regarding directions or hours of operation,  please call [phone].   Just as a reminder please drink plenty of water prior to coming for your lab work. Thanks!   

## 2018-08-15 ENCOUNTER — Other Ambulatory Visit: Payer: Self-pay | Admitting: Family Medicine

## 2018-08-15 DIAGNOSIS — I10 Essential (primary) hypertension: Secondary | ICD-10-CM

## 2018-08-30 ENCOUNTER — Ambulatory Visit: Payer: PPO | Admitting: Family Medicine

## 2018-08-31 ENCOUNTER — Ambulatory Visit: Payer: PPO | Admitting: Family Medicine

## 2018-09-15 ENCOUNTER — Ambulatory Visit: Payer: PPO | Admitting: Family Medicine

## 2018-09-24 ENCOUNTER — Telehealth: Payer: Self-pay | Admitting: Pharmacy Technician

## 2018-09-24 DIAGNOSIS — M0579 Rheumatoid arthritis with rheumatoid factor of multiple sites without organ or systems involvement: Secondary | ICD-10-CM

## 2018-09-24 MED ORDER — HYDROXYCHLOROQUINE SULFATE 200 MG PO TABS: 200.0000 mg | ORAL_TABLET | Freq: Every day | ORAL | 0 refills | Status: DC

## 2018-09-24 NOTE — Telephone Encounter (Signed)
Patient is on Plaquenil. Please send in prescription  Called patient and advised.

## 2018-10-06 ENCOUNTER — Telehealth: Payer: Self-pay | Admitting: Family Medicine

## 2018-10-06 NOTE — Telephone Encounter (Signed)
Called pt to see if she wanted to do a webex visit. Pt declined and stated she was doing fine and would call if she needed to be seen.

## 2018-10-11 ENCOUNTER — Telehealth: Payer: Self-pay | Admitting: Family Medicine

## 2018-10-11 NOTE — Telephone Encounter (Signed)
Virtual visit please. 

## 2018-10-11 NOTE — Telephone Encounter (Signed)
Last seen over 6 mo ago do you want to refill and push out f/u or have phone/office visit?

## 2018-10-11 NOTE — Telephone Encounter (Signed)
MEDICATION: NUPIROCIN 2% OINTMENT  PHARMACY:   [id number] - [place],  - [address] (Phone) [phone] (Fax)   IS THIS A 90 DAY SUPPLY : no  IS PATIENT OUT OF MEDICATION: yes  IF NOT; HOW MUCH IS LEFT:   LAST APPOINTMENT DATE: @4 /07/2018  NEXT APPOINTMENT DATE:@Visit  date not found  OTHER COMMENTS: Patient does not have access to a laptop or smartphone to do virtual visit for refill.     **Let patient know to contact pharmacy at the end of the day to make sure medication is ready. **  ** Please notify patient to allow 48-72 hours to process**  **Encourage patient to contact the pharmacy for refills or they can request refills through [facility]**

## 2018-10-12 NOTE — Telephone Encounter (Signed)
Scheduled pt tele visit.Laura Kitchenthe patient does not have smart phone or access to email

## 2018-10-13 ENCOUNTER — Ambulatory Visit (INDEPENDENT_AMBULATORY_CARE_PROVIDER_SITE_OTHER): Payer: PPO | Admitting: Family Medicine

## 2018-10-13 ENCOUNTER — Other Ambulatory Visit: Payer: Self-pay

## 2018-10-13 ENCOUNTER — Encounter: Payer: Self-pay | Admitting: Family Medicine

## 2018-10-13 VITALS — Ht 66.0 in | Wt 199.0 lb

## 2018-10-13 DIAGNOSIS — M0579 Rheumatoid arthritis with rheumatoid factor of multiple sites without organ or systems involvement: Secondary | ICD-10-CM

## 2018-10-13 DIAGNOSIS — I872 Venous insufficiency (chronic) (peripheral): Secondary | ICD-10-CM

## 2018-10-13 MED ORDER — MUPIROCIN 2 % EX OINT: 1.0000 "application " | TOPICAL_OINTMENT | Freq: Two times a day (BID) | CUTANEOUS | 0 refills | Status: DC

## 2018-10-13 MED ORDER — MUPIROCIN 2 % EX OINT: 1.0000 "application " | TOPICAL_OINTMENT | Freq: Two times a day (BID) | CUTANEOUS | 5 refills | Status: DC

## 2018-10-13 NOTE — Progress Notes (Signed)
Virtual Visit via Video   I connected with Laura Chambers on 10/13/18 at  1:40 PM EDT by a video enabled telemedicine application and verified that I am speaking with the correct person using two identifiers. Location patient: Home Location provider: [facility], Office Persons participating in the virtual visit: Mariea, Mcmartin, DO Lonell Grandchild, CMA acting as scribe for Dr. Briscoe Deutscher.    I discussed the limitations of evaluation and management by telemedicine and the availability of in person appointments. The patient expressed understanding and agreed to proceed.  Subjective:   HPI:  Foot pain patient has had increased leg pain. Has history of dermatitis. Right leg has gotten red. She has been using home remedy. Has improved some. Was given Mupirocin 2%. She would like refill. We will call in refill if no improvement she will call and we will see in office. She was instructed to look for any open areas, break in skin and other issues.    She has been doing some home exercises to keep up her strength.   Reviewed all precautions and expectations with prevention of Covid-19. ROS: See pertinent positives and negatives per HPI.  Patient Active Problem List   Diagnosis Date Noted  . Rheumatoid arthritis involving multiple sites with positive rheumatoid factor ([place]) 10/13/2018  . Hx of peripheral neuropathy 12/15/2017  . Hx of colonic polyps 12/15/2017  . History of depression 12/15/2017  . History of anxiety 12/15/2017  . 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 Chronic kidney disease (CKD), active medical management without dialysis, stage 3 (moderate) ([place]) 12/15/2017  . Fibromyalgia 12/15/2017  . History of total right hip replacement 12/15/2017  . DDD (degenerative disc disease), lumbar 12/15/2017  . 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 Left foot drop 12/15/2017  . Pain in both hands 09/25/2017  . Reactive depression (situational) 08/17/2017    . Status post revision of total knee, left 06/09/2017  . Obesity, Class III, BMI 40-49.9 (morbid obesity) ([place]) 12/28/2016  . Anemia 12/16/2016  . AKI (acute kidney injury) ([place]) 12/15/2016  . Essential hypertension 12/08/2016  . 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 Knee pain, left 11/11/2016  . Pain in left leg 11/10/2016  . Musculoskeletal neck pain 08/19/2015  . Peripheral neuropathy 01/12/2014  . Hyperglycemia 06/16/2012  . 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 Dyslipidemia 06/16/2012  . Globus sensation 02/04/2012  . Nausea 02/04/2012  . Bowel habit changes 02/04/2012    Social History   Tobacco Use  . Smoking status: Never Smoker  . Smokeless tobacco: Never Used  Substance Use Topics  . Alcohol use: No   Current Outpatient Medications:  .  amLODipine (NORVASC) 10 MG tablet, TAKE 1 TABLET (10 MG TOTAL) DAILY BY MOUTH., Disp: 90 tablet, Rfl: 1 .  atenolol (TENORMIN) 50 MG tablet, Take 1 tablet (50 mg total) by mouth daily., Disp: 90 tablet, Rfl: 3 .  Calcium Carbonate-Vitamin D (CALCIUM 600+D) 600-400 MG-UNIT tablet, Take 1 tablet by mouth every other day., Disp: , Rfl:  .  ferrous sulfate 325 (65 FE) MG EC tablet, Take 325 mg by mouth 3 (three) times daily with meals., Disp: , Rfl:  .  FLUoxetine (PROZAC) 20 MG capsule, Take 1 capsule (20 mg total) by mouth every morning., Disp: 90 capsule, Rfl: 3 .  folic acid (FOLVITE) 329 MCG tablet, Take 800 mcg daily by mouth. , Disp: , Rfl:  .  furosemide (LASIX) 40 MG tablet, Take  1 tablet (40 mg total) daily as needed by mouth for edema., Disp: 90 tablet, Rfl: 0 .  gabapentin (NEURONTIN) 300 MG capsule, TAKE 1 CAPSULE BY MOUTH 2 TIMES DAILY, Disp: 180 capsule, Rfl: 1 .  Ginger, Zingiber officinalis, (GINGER ROOT PO), Take by mouth daily., Disp: , Rfl:  .  hydroxychloroquine (PLAQUENIL) 200 MG tablet, Take 1 tablet (200 mg total) by mouth daily., Disp: 90 tablet, Rfl: 0 .  Misc Natural  Products (TART CHERRY ADVANCED PO), Take by mouth., Disp: , Rfl:  .  Multiple Vitamin (MULTIVITAMIN WITH MINERALS) TABS tablet, Take 1 tablet by mouth every other day., Disp: , Rfl:  .  Omega-3 1000 MG CAPS, Take by mouth., Disp: , Rfl:  .  promethazine (PHENERGAN) 25 MG tablet, Take 1 tablet (25 mg total) by mouth every 8 (eight) hours as needed for nausea or vomiting., Disp: 30 tablet, Rfl: 0 .  rosuvastatin (CRESTOR) 10 MG tablet, TAKE 1 TABLET BY MOUTH DAILY IN EVENING, Disp: 90 tablet, Rfl: 0 .  tamsulosin (FLOMAX) 0.4 MG CAPS capsule, Take 0.4 mg by mouth as needed., Disp: , Rfl:  .  Turmeric 400 MG CAPS, Take by mouth., Disp: , Rfl:  .  mupirocin ointment (BACTROBAN) 2 %, Place 1 application into the nose 2 (two) times daily., Disp: 60 g, Rfl: 0  Allergies  Allergen Reactions  . Ativan [Lorazepam] Other (See Comments)    Highly irritable  . Haldol [Haloperidol] Other (See Comments)    Vegetative State  . Epinephrine Other (See Comments)    Reaction:  Increased pts BP and caused shoulder to jerk   . Levofloxacin Nausea And Vomiting  . Lipitor [Atorvastatin] Other (See Comments)    Reaction:  Joint pain and dizziness   . Trovan [Alatrofloxacin] Nausea And Vomiting    Pt states that this med caused pancreatitis.    Objective:   VITALS: Per patient if applicable, see vitals. GENERAL: Alert, appears well and in no acute distress. HEENT: Atraumatic, conjunctiva clear, no obvious abnormalities on inspection of external nose and ears. NECK: Normal movements of the head and neck. CARDIOPULMONARY: No increased WOB. Speaking in clear sentences. I:E ratio WNL.  MS: Moves all visible extremities without noticeable abnormality. PSYCH: Pleasant and cooperative, well-groomed. Speech normal rate and rhythm. Affect is appropriate. Insight and judgement are appropriate. Attention is focused, linear, and appropriate.  NEURO: CN grossly intact. Oriented as arrived to appointment on time with no  prompting. Moves both UE equally.  SKIN: No obvious lesions, wounds, erythema, or cyanosis noted on face or hands.  Assessment and Plan:   Laura Chambers was seen today for follow-up.  Diagnoses and all orders for this visit:  Venous stasis dermatitis of both lower extremities -     Discontinue: mupirocin ointment (BACTROBAN) 2 %; Place 1 application into the nose 2 (two) times daily. -     mupirocin ointment (BACTROBAN) 2 %; Apply 1 application topically 2 (two) times daily.  Obesity, Class III, BMI 40-49.9 (morbid obesity) ([facility])  Rheumatoid arthritis involving multiple sites with positive rheumatoid factor ([place])    . Reviewed expectations re: course of current medical issues. . Discussed self-management of symptoms. . Outlined signs and symptoms indicating need for more acute intervention. . Patient verbalized understanding and all questions were answered. Marland Kitchen Health Maintenance issues including appropriate healthy diet, exercise, and smoking avoidance were discussed with patient. . See orders for this visit as documented in the electronic medical record.  Briscoe Deutscher, DO 10/13/2018

## 2018-10-29 IMAGING — US US RENAL
1 series · 14 of 25 positions shown · non-contrast
Comparison: None.

CLINICAL DATA: Renal failure

EXAM:
RENAL / URINARY TRACT ULTRASOUND COMPLETE

[Series 1: us renal · 0.23mm/px · 14 of 43 slices shown]
[im 1/43]
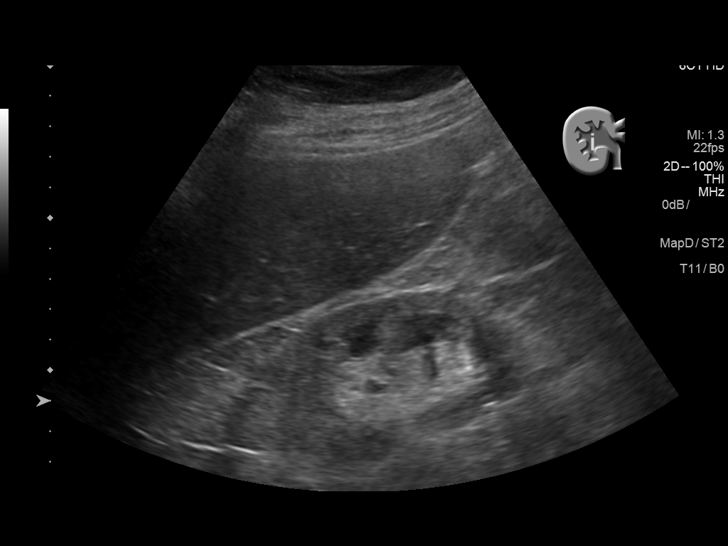
[im 4/43]
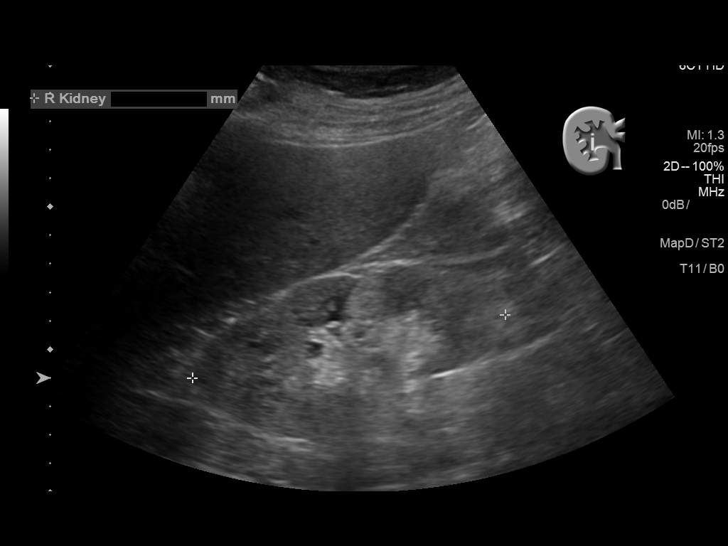
[im 8/43]
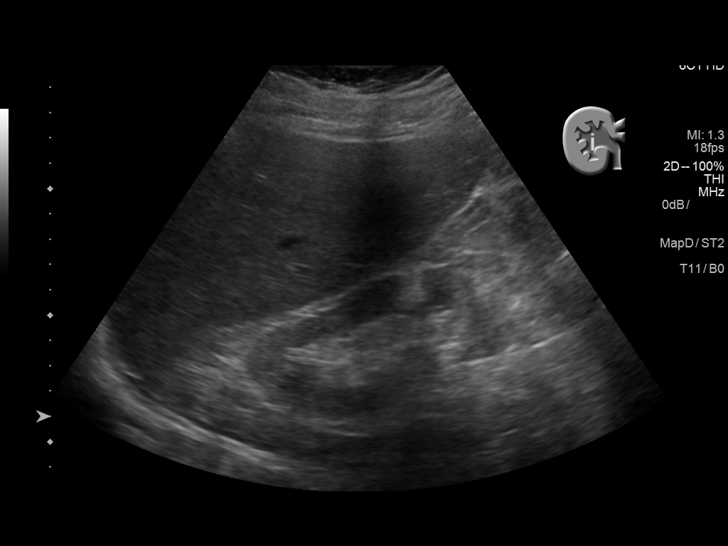
[im 11/43]
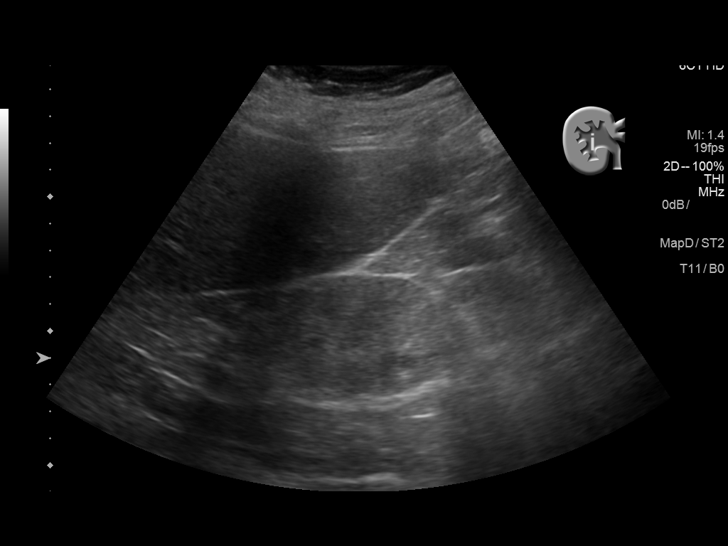
[im 15/43]
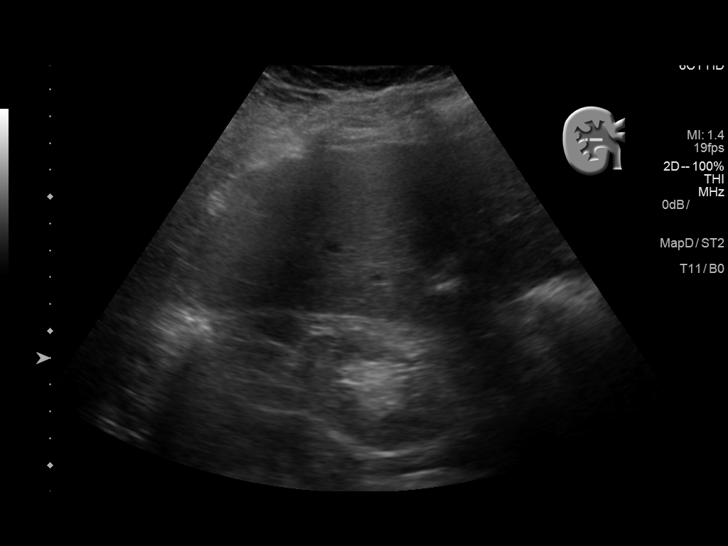
[im 16/43]
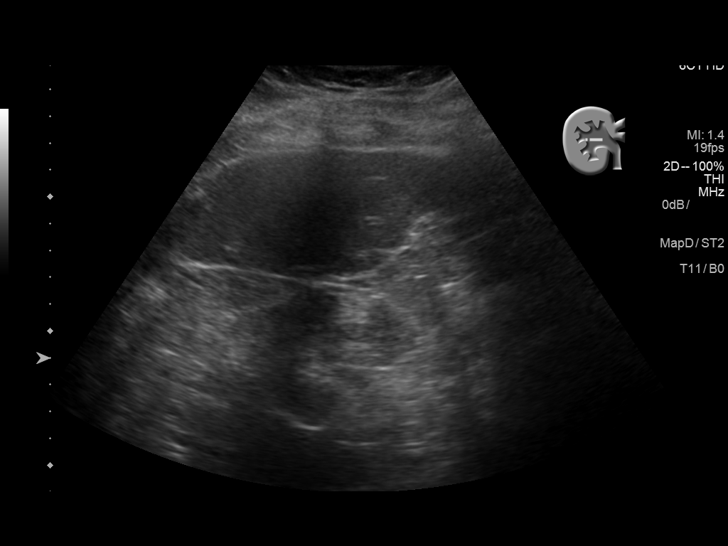
[im 20/43]
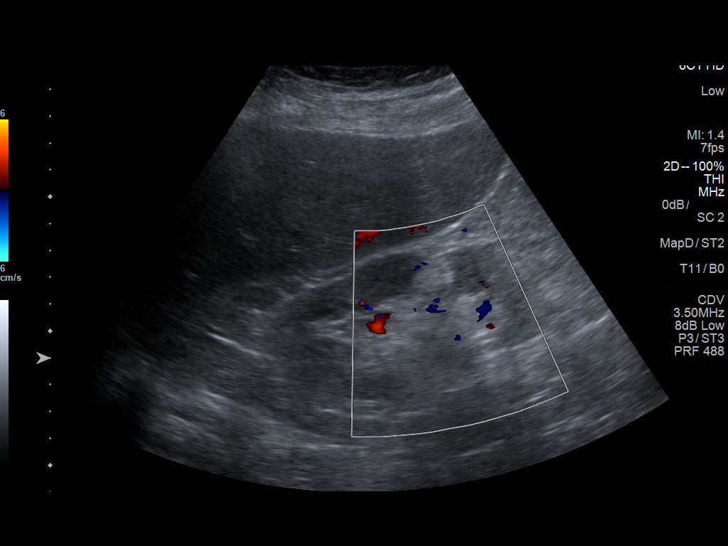
[im 23/43]
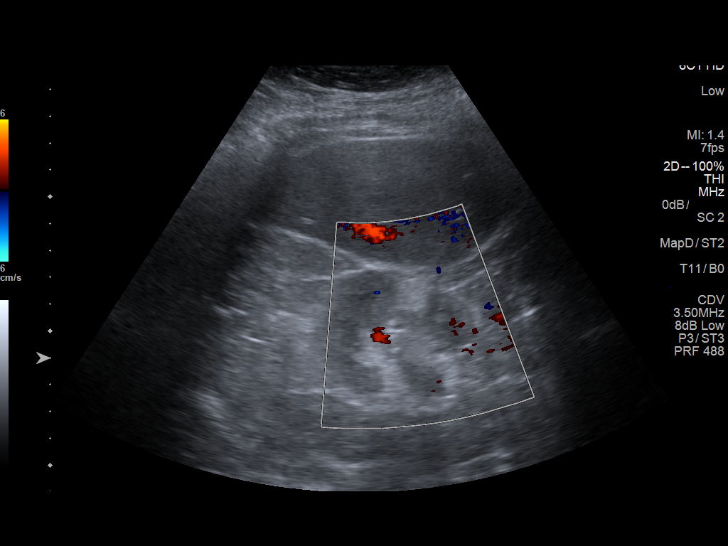
[im 27/43]
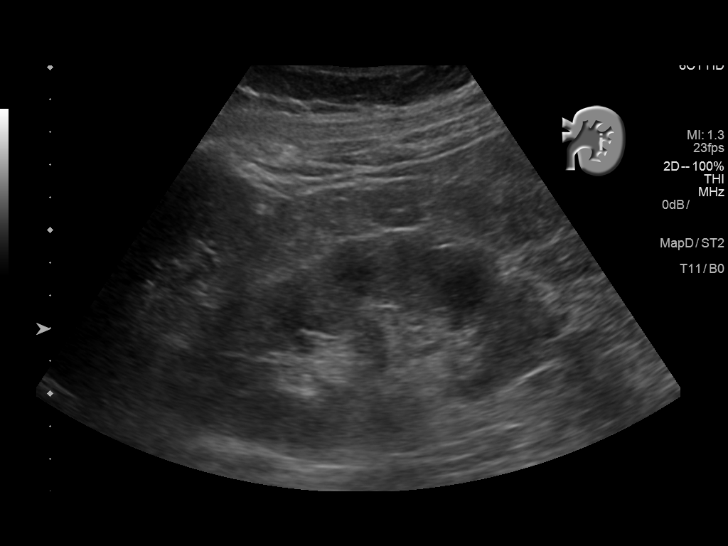
[im 29/43]
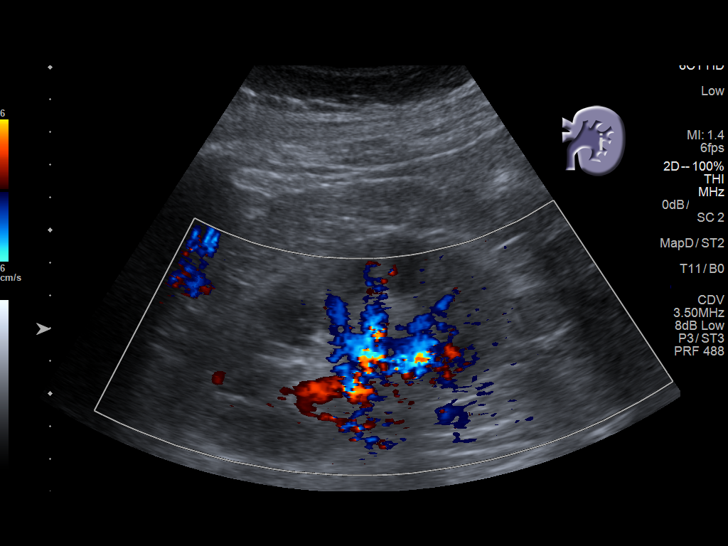
[im 32/43]
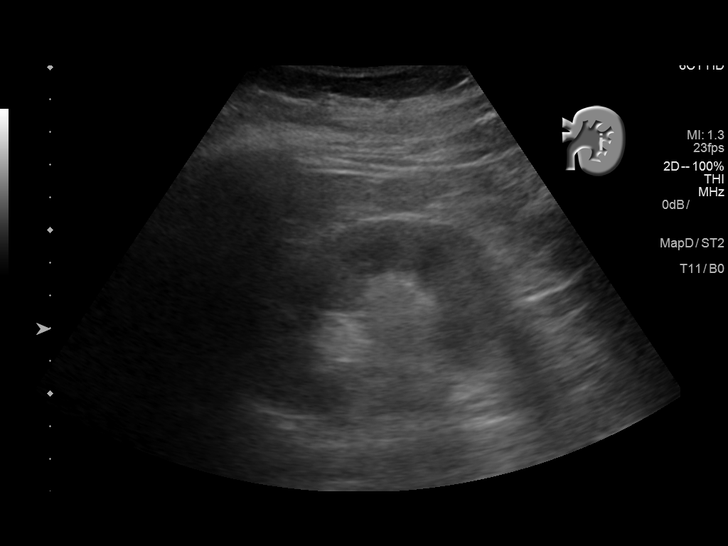
[im 36/43]
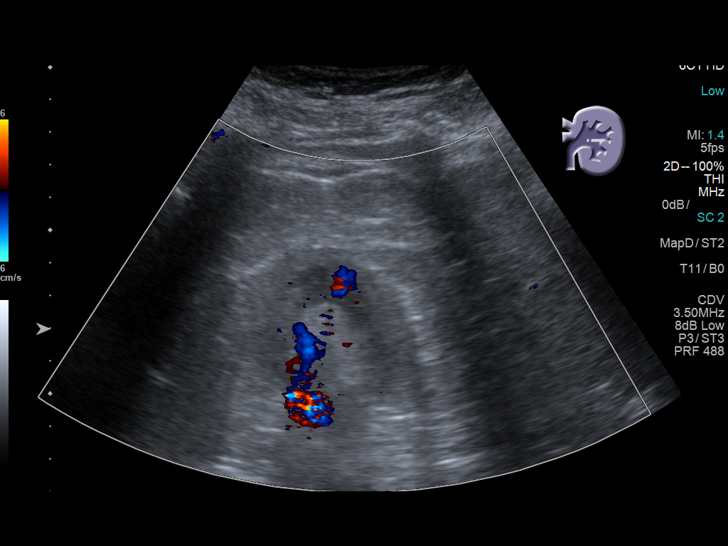
[im 39/43]
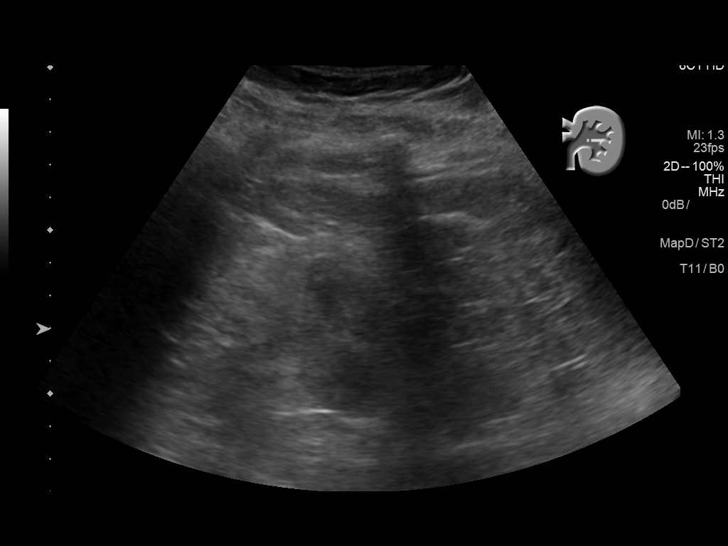
[im 43/43]
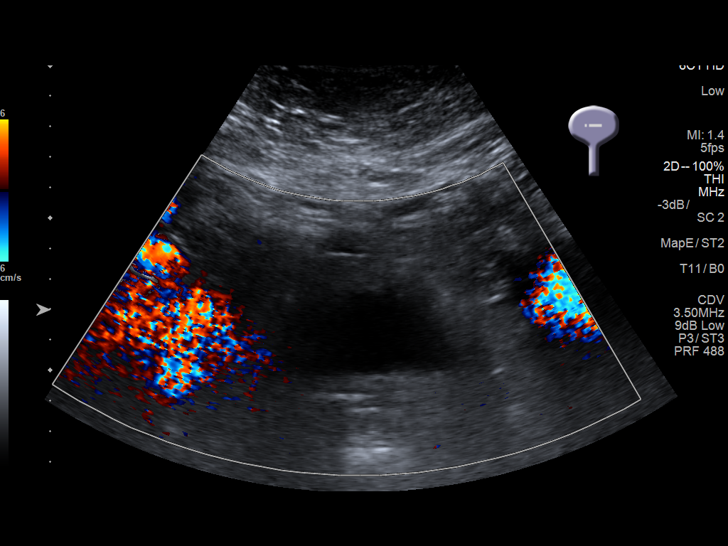

[14 of 25 positions shown; findings below may reference images not displayed]

FINDINGS: Right Kidney:

Length: 11.2 cm. 16 x 11 x 12 mm right lower pole renal
angiomyolipoma. No hydronephrosis.

Left Kidney:

Length: 11.6 cm.  No mass or hydronephrosis.

Bladder:

Underdistended.
IMPRESSION: 1.6 cm right renal angiomyolipoma.

No hydronephrosis.

## 2018-11-14 ENCOUNTER — Other Ambulatory Visit: Payer: Self-pay | Admitting: Family Medicine

## 2018-11-14 DIAGNOSIS — E785 Hyperlipidemia, unspecified: Secondary | ICD-10-CM

## 2018-11-15 NOTE — Telephone Encounter (Signed)
Last OV 10/13/2018 Last refill 05/24/2018 #90/0 Next OV not scheduled

## 2018-12-14 NOTE — Progress Notes (Deleted)
Office Visit Note  Patient: Laura Chambers             Date of Birth: 20-Jan-1941           MRN: [id number]             PCP: Briscoe Deutscher, DO Referring: Briscoe Deutscher, DO Visit Date: 12/28/2018 Occupation: @GUAROCC @  Subjective:  No chief complaint on file.  Plaquenil 200 mg 1 tablet daily.  Last Plaquenil eye exam normal on 03/20/2019.  Most recent CBC/CMP stable on 04/23/2018.  She is overdue for labs.  CBC/CMP ordered for today and will monitor every 5 months.  She received a high-dose flu vaccine in September and previously Prevnar 13.  Recommend Pneumovax 23 and Shingrix vaccine as indicated.   History of Present Illness: KRISTIANNA SAPERSTEIN is a 78 y.o. female ***   Activities of Daily Living:  Patient reports morning stiffness for *** {minute/hour:19697}.   Patient {ACTIONS;DENIES/REPORTS:21021675::"Denies"} nocturnal pain.  Difficulty dressing/grooming: {ACTIONS;DENIES/REPORTS:21021675::"Denies"} Difficulty climbing stairs: {ACTIONS;DENIES/REPORTS:21021675::"Denies"} Difficulty getting out of chair: {ACTIONS;DENIES/REPORTS:21021675::"Denies"} Difficulty using hands for taps, buttons, cutlery, and/or writing: {ACTIONS;DENIES/REPORTS:21021675::"Denies"}  No Rheumatology ROS completed.   PMFS History:  Patient Active Problem List   Diagnosis Date Noted  . Rheumatoid arthritis involving multiple sites with positive rheumatoid factor ([place]) 10/13/2018  . Hx of peripheral neuropathy 12/15/2017  . Hx of colonic polyps 12/15/2017  . History of depression 12/15/2017  . History of anxiety 12/15/2017  . 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 Chronic kidney disease (CKD), active medical management without dialysis, stage 3 (moderate) ([place]) 12/15/2017  . Fibromyalgia 12/15/2017  . History of total right hip replacement 12/15/2017  . DDD (degenerative disc disease), lumbar 12/15/2017  . 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 Left foot drop 12/15/2017  . Pain in both  hands 09/25/2017  . 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 Status post revision of total knee, left 06/09/2017  . Obesity, Class III, BMI 40-49.9 (morbid obesity) ([place]) 12/28/2016  . Anemia 12/16/2016  . AKI (acute kidney injury) ([place]) 12/15/2016  . Essential hypertension 12/08/2016  . 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 Knee pain, left 11/11/2016  . Pain in left leg 11/10/2016  . Musculoskeletal neck pain 08/19/2015  . Peripheral neuropathy 01/12/2014  . Hyperglycemia 06/16/2012  . 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 Dyslipidemia 06/16/2012  . Globus sensation 02/04/2012  . Nausea 02/04/2012  . Bowel habit changes 02/04/2012    Past Medical History:  Diagnosis Date  . Anemia   . Anxiety   . Arthritis   . Atelectasis, left    left lower lung  . Blood transfusion without reported diagnosis   . Cataract cortical, senile, bilateral 03/19/2018  . Complication of anesthesia 2012   "irritated trachea" with cough x 1 year from intubation 2012.  smaller tube used with other surgeries  . Depression    just lost her son a month ago  . Esophageal hernia   . Fibromyalgia   . Headache    occasionally  . HTN (hypertension)   . Hx of colonic polyps   . Hydronephrosis of right kidney   . Hypercholesterolemia   . Hypertensive retinopathy of both eyes 03/19/2018  . 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 Obesity   . 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 Osteoporosis   . Renal angiomyolipoma   . Rheumatoid arthritis ([place])  Family History  Problem Relation Age of Onset  . Diabetes Father   . Osteoarthritis Father   . Heart disease Father   . Stroke Father   . Osteoarthritis Mother   . Stroke Mother   . Pneumonia Mother   . Colon cancer Sister 40  . Colon polyps Other        neice/nephew  . Diabetes Sister   . Diabetes Brother    Past Surgical History:   Procedure Laterality Date  . ABDOMINAL HYSTERECTOMY  1969  . BACK SURGERY  2012   rod in back-  lumbar disc removal with bone graft  . blephoroplasty     bilateral  . BREAST BIOPSY  1989  . CHOLECYSTECTOMY  2010  . EXCISIONAL TOTAL KNEE ARTHROPLASTY WITH ANTIBIOTIC SPACERS Left 11/14/2016   Procedure: EXCISIONAL LEFT TOTAL KNEE ARTHROPLASTY WITH  PLACEMENT OF ANTIBIOTIC SPACERS;  Surgeon: [name], MD;  Location: [facility];  Service: Orthopedics;  Laterality: Left;  . JOINT REPLACEMENT    . NISSEN FUNDOPLICATION    . PARAESOPHAGEAL HERNIA REPAIR  1989  . REVISION TOTAL KNEE ARTHROPLASTY Left 06/09/2017  . TONSILLECTOMY  1946  . TOTAL HIP ARTHROPLASTY Right 08/31/2012   Procedure: TOTAL HIP ARTHROPLASTY ANTERIOR APPROACH;  Surgeon: Mauri Pole, MD;  Location: [facility];  Service: Orthopedics;  Laterality: Right;  . TOTAL KNEE ARTHROPLASTY  2001   left  . TOTAL KNEE ARTHROPLASTY  2010   right  . TOTAL KNEE REVISION Left 06/09/2017   Procedure: LEFT REVISION KNEE ARTHROPLASTY;  Surgeon: [name], MD;  Location: [facility];  Service: Orthopedics;  Laterality: Left;   Social History   Social History Narrative   Patient lives at home alone.   Caffeine Use: 1 cup daily   Immunization History  Administered Date(s) Administered  . Influenza, High Dose Seasonal PF 03/27/2018  . Influenza,inj,Quad PF,6+ Mos 03/13/2017  . Influenza-Unspecified 03/26/2018  . Pneumococcal Conjugate-13 02/26/2018     Objective: Vital Signs: There were no vitals taken for this visit.   Physical Exam   Musculoskeletal Exam: ***  CDAI Exam: CDAI Score: Not documented Patient Global Assessment: Not documented; Provider Global Assessment: Not documented Swollen: Not documented; Tender: Not documented Joint Exam   Not documented   There is currently no information documented on the homunculus. Go to the Rheumatology activity and complete the homunculus joint exam.  Investigation:  No additional findings.  Imaging: No results found.  Recent Labs: Lab Results  Component Value Date   WBC 9.5 04/23/2018   HGB 11.2 (L) 04/23/2018   PLT 291 04/23/2018   NA 141 04/23/2018   K 4.4 04/23/2018   CL 105 04/23/2018   CO2 27 04/23/2018   GLUCOSE 106 (H) 04/23/2018   BUN 26 (H) 04/23/2018   CREATININE 1.48 (H) 04/23/2018   BILITOT 0.6 04/23/2018   ALKPHOS 93 02/26/2018   AST 18 04/23/2018   ALT 10 04/23/2018   PROT 7.2 04/23/2018   ALBUMIN 4.0 02/26/2018   CALCIUM 9.2 04/23/2018   GFRAA 39 (L) 04/23/2018    Speciality Comments: PLQ Eye Exam: 03/19/18 WNL @ [place] Exam follow up in 6 months.   Procedures:  No procedures performed Allergies: Ativan [lorazepam]; Haldol [haloperidol]; Epinephrine; Levofloxacin; Lipitor [atorvastatin]; and Trovan [alatrofloxacin]   Assessment / Plan:     Visit Diagnoses: No diagnosis found.   Orders: No orders of the defined types were placed in this encounter.  No orders of the defined types were placed in  this encounter.   Face-to-face time spent with patient was *** minutes. Greater than 50% of time was spent in counseling and coordination of care.  Follow-Up Instructions: No follow-ups on file.   Earnestine Mealing, CMA  Note - This record has been created using Editor, commissioning.  Chart creation errors have been sought, but may not always  have been located. Such creation errors do not reflect on  the standard of medical care.

## 2018-12-24 ENCOUNTER — Encounter: Payer: Self-pay | Admitting: Gastroenterology

## 2018-12-28 ENCOUNTER — Ambulatory Visit: Payer: Self-pay | Admitting: Rheumatology

## 2019-02-02 ENCOUNTER — Other Ambulatory Visit: Payer: Self-pay | Admitting: Family Medicine

## 2019-02-02 DIAGNOSIS — I10 Essential (primary) hypertension: Secondary | ICD-10-CM

## 2019-02-08 ENCOUNTER — Other Ambulatory Visit: Payer: Self-pay | Admitting: Family Medicine

## 2019-02-08 ENCOUNTER — Other Ambulatory Visit: Payer: Self-pay | Admitting: Rheumatology

## 2019-02-08 DIAGNOSIS — E785 Hyperlipidemia, unspecified: Secondary | ICD-10-CM

## 2019-02-08 DIAGNOSIS — M0579 Rheumatoid arthritis with rheumatoid factor of multiple sites without organ or systems involvement: Secondary | ICD-10-CM

## 2019-02-08 NOTE — Telephone Encounter (Signed)
Last Visit: 07/22/18 Next Visit: 02/09/19 Labs: 04/23/18 stable PLQ Eye Exam: 03/19/18 WNL   Patient advised she is due for labs and a follow up visit. Patient has been scheduled for 02/09/19. Will refill prescription after appointment.

## 2019-02-08 NOTE — Progress Notes (Signed)
Office Visit Note  Patient: Laura Chambers             Date of Birth: 1941/04/12           MRN: [id number]             PCP: Briscoe Deutscher, DO Referring: Briscoe Deutscher, DO Visit Date: 02/09/2019 Occupation: @GUAROCC @  Subjective:  Medication monitoring.   History of Present Illness: Laura Chambers is a 78 y.o. female with history of seropositive rheumatoid arthritis.  She states she does not have any joint swelling.  She states she has intermittent joint discomfort.  She has been tolerating Plaquenil well.  She has minimal morning stiffness.  She states she does some stretches early in the morning.  Her replaced joints are doing well.  She is very much concerned during the pandemic and has not been out of the house much.  Activities of Daily Living:  Patient reports morning stiffness for 5 minutes.   Patient Denies nocturnal pain.  Difficulty dressing/grooming: Denies Difficulty climbing stairs: Reports Difficulty getting out of chair: Reports Difficulty using hands for taps, buttons, cutlery, and/or writing: Reports  Review of Systems  Constitutional: Negative for fatigue, night sweats, weight gain and weight loss.  HENT: Negative for mouth sores, nosebleeds, trouble swallowing, trouble swallowing, mouth dryness and nose dryness.   Eyes: Positive for dryness. Negative for pain, redness and visual disturbance.  Respiratory: Negative for cough, shortness of breath, wheezing and difficulty breathing.   Cardiovascular: Negative for chest pain, palpitations, hypertension, irregular heartbeat and swelling in legs/feet.  Gastrointestinal: Negative for abdominal pain, blood in stool, constipation and diarrhea.  Endocrine: Negative for increased urination.  Genitourinary: Negative for painful urination, pelvic pain and vaginal dryness.  Musculoskeletal: Positive for morning stiffness. Negative for arthralgias, joint pain, joint swelling, myalgias, muscle weakness, muscle tenderness  and myalgias.  Skin: Negative for color change, rash, hair loss, redness, skin tightness, ulcers and sensitivity to sunlight.  Allergic/Immunologic: Negative for susceptible to infections.  Neurological: Negative for dizziness, light-headedness, headaches, memory loss, night sweats and weakness.  Hematological: Negative for bruising/bleeding tendency and swollen glands.  Psychiatric/Behavioral: Negative for depressed mood, confusion and sleep disturbance. The patient is not nervous/anxious.     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   Chronic kidney disease (CKD), active medical management without dialysis, stage 3 (moderate) ([place]) 12/15/2017   Fibromyalgia 12/15/2017   History of total right hip replacement 12/15/2017   DDD (degenerative disc disease), lumbar 12/15/2017   S/P total knee replacement, right 12/15/2017   Left foot drop 12/15/2017   Pain in both hands 09/25/2017   Reactive depression (situational) 08/17/2017   Status post revision of total knee, left 06/09/2017   Obesity, Class III, BMI 40-49.9 (morbid obesity) ([place]) 12/28/2016   Anemia 12/16/2016   AKI (acute kidney injury) ([place]) 12/15/2016   Essential hypertension 12/08/2016   Infected prosthetic knee joint, sequela 12/03/2016   Knee pain, left 11/11/2016   Pain in left leg 11/10/2016   Musculoskeletal neck pain 08/19/2015   Peripheral neuropathy 01/12/2014   Hyperglycemia 06/16/2012   Benign hypertensive heart disease without heart failure 06/16/2012   Dyslipidemia 06/16/2012   Globus sensation 02/04/2012   Nausea  02/04/2012   Bowel habit changes 02/04/2012    Past Medical History:    Diagnosis Date   Anemia    Anxiety    Arthritis    Atelectasis, left    left lower lung   Blood transfusion without reported diagnosis    Cataract cortical, senile, bilateral 00/76/2263   Complication of anesthesia 2012   "irritated trachea" with cough x 1 year from intubation 2012.  smaller tube used with other surgeries   Depression    just lost her son a month ago   Esophageal hernia    Fibromyalgia    Headache    occasionally   HTN (hypertension)    Hx of colonic polyps    Hydronephrosis of right kidney    Hypercholesterolemia    Hypertensive retinopathy of both eyes 03/19/2018   Neuropathy due to medical condition ([place])    bilateral lower legs   Obesity    Osteoarthritis (arthritis due to wear and tear of joints)    Osteoporosis    Renal angiomyolipoma    Rheumatoid arthritis ([place])     Family History  Problem Relation Age of Onset   Diabetes Father    Osteoarthritis Father    Heart disease Father    Stroke Father    Osteoarthritis Mother    Stroke Mother    Pneumonia Mother    Colon cancer Sister 77   Colon polyps Other        neice/nephew   Diabetes Sister    Diabetes Brother    Past Surgical History:  Procedure Laterality Date   ABDOMINAL HYSTERECTOMY  1969   BACK SURGERY  2012   rod in back-  lumbar disc removal with bone graft   blephoroplasty     bilateral   BREAST BIOPSY  1989   CHOLECYSTECTOMY  2010   EXCISIONAL TOTAL KNEE ARTHROPLASTY WITH ANTIBIOTIC SPACERS Left 11/14/2016   Procedure: EXCISIONAL LEFT TOTAL KNEE ARTHROPLASTY WITH  PLACEMENT OF ANTIBIOTIC SPACERS;  Surgeon: [name], MD;  Location: [facility];  Service: Orthopedics;  Laterality: Left;   JOINT REPLACEMENT     NISSEN FUNDOPLICATION     PARAESOPHAGEAL HERNIA REPAIR  1989   REVISION TOTAL KNEE ARTHROPLASTY Left 06/09/2017   TONSILLECTOMY  1946   TOTAL HIP ARTHROPLASTY Right 08/31/2012   Procedure: TOTAL HIP ARTHROPLASTY  ANTERIOR APPROACH;  Surgeon: Mauri Pole, MD;  [facility];  Service: Orthopedics;  Laterality: Right;   TOTAL KNEE ARTHROPLASTY  2001   left   TOTAL KNEE ARTHROPLASTY  2010   right   TOTAL KNEE REVISION Left 06/09/2017   Procedure: LEFT REVISION KNEE ARTHROPLASTY;  Surgeon: [name], MD;  Location: [facility];  Service: Orthopedics;  Laterality: Left;   Social History   Social History Narrative   Patient lives at home alone.   Caffeine Use: 1 cup daily   Immunization History  Administered Date(s) Administered   Influenza, High Dose Seasonal PF 03/27/2018   Influenza,inj,Quad PF,6+ Mos 03/13/2017   Influenza-Unspecified 03/26/2018   Pneumococcal Conjugate-13 02/26/2018     Objective: Vital Signs: BP 119/70 (BP Location: Left Arm, Patient Position: Sitting, Cuff Size: Normal)    Pulse 64    Resp 15    Ht 5\' 7"  (1.702 m)    Wt 204 lb 3.2 oz (92.6 kg)    BMI 31.98 kg/m    Physical Exam Vitals signs and nursing note reviewed.  Constitutional:      Appearance: She is well-developed.  HENT:  Head: Normocephalic and atraumatic.  Eyes:     Conjunctiva/sclera: Conjunctivae normal.  Neck:     Musculoskeletal: Normal range of motion.  Cardiovascular:     Rate and Rhythm: Normal rate and regular rhythm.     Heart sounds: Normal heart sounds.  Pulmonary:     Effort: Pulmonary effort is normal.     Breath sounds: Normal breath sounds.  Abdominal:     General: Bowel sounds are normal.     Palpations: Abdomen is soft.  Musculoskeletal:     Right lower leg: Edema present.  Lymphadenopathy:     Cervical: No cervical adenopathy.  Skin:    General: Skin is warm and dry.     Capillary Refill: Capillary refill takes less than 2 seconds.  Neurological:     Mental Status: She is alert and oriented to person, place, and time.  Psychiatric:        Behavior: Behavior normal.      Musculoskeletal Exam: C-spine good range of motion.  She has limited range of  motion of her lumbar spine.  Shoulder joints, elbow joints, wrist joints with good range of motion.  She has DIP and PIP thickening but no synovitis of her MCPs was noted.  Hip joints were in good range of motion.  She has bilateral knee replacement with some warmth on palpation of her right knee joint.  She had right lower extremity significant edema.  CDAI Exam: CDAI Score: 0.6  Patient Global: 4 mm; Provider Global: 2 mm Swollen: 0 ; Tender: 0  Joint Exam   No joint exam has been documented for this visit   There is currently no information documented on the homunculus. Go to the Rheumatology activity and complete the homunculus joint exam.  Investigation: No additional findings.  Imaging: No results found.  Recent Labs: Lab Results  Component Value Date   WBC 9.5 04/23/2018   HGB 11.2 (L) 04/23/2018   PLT 291 04/23/2018   NA 141 04/23/2018   K 4.4 04/23/2018   CL 105 04/23/2018   CO2 27 04/23/2018   GLUCOSE 106 (H) 04/23/2018   BUN 26 (H) 04/23/2018   CREATININE 1.48 (H) 04/23/2018   BILITOT 0.6 04/23/2018   ALKPHOS 93 02/26/2018   AST 18 04/23/2018   ALT 10 04/23/2018   PROT 7.2 04/23/2018   ALBUMIN 4.0 02/26/2018   CALCIUM 9.2 04/23/2018   GFRAA 39 (L) 04/23/2018    Speciality Comments: PLQ Eye Exam: 03/19/18 WNL @ [facility] Exam follow up in 6 months.   Procedures:  No procedures performed Allergies: Ativan [lorazepam], Haldol [haloperidol], Epinephrine, Levofloxacin, Lipitor [atorvastatin], and Trovan [alatrofloxacin]   Assessment / Plan:     Visit Diagnoses: Rheumatoid arthritis involving multiple sites with positive rheumatoid factor ([place]) -patient has no synovitis on examination.  She states she has intermittent arthralgias.  Plan: hydroxychloroquine (PLAQUENIL) 200 MG tablet p.o. daily.  High risk medication use - PLQ 200 mg 1 tablet by mouth daily (Low GFR)-initially experienced GI SE which have resolved.Eye Exam: 03/19/18.  I have advised her to schedule  an eye examination.  She is hesitant because of the pandemic.- Plan: CBC, COMPLETE METABOLIC PANEL WITH GFR, today and then at the follow-up visit.  History of total right hip replacement -she has good range of motion.  S/P total knee replacement, right -she is some chronic discomfort.  Status post revision of total knee, left -chronic discomfort.  Infected prosthetic knee joint, sequela   DDD (degenerative disc disease), lumbar -  she is chronic pain.  Left foot drop -patient states that she has noticed some improvement.  Fibromyalgia -she continues to have some generalized pain and discomfort.  Need for regular exercise was emphasized.  Chronic kidney disease (CKD), active medical management without dialysis, stage 3 (moderate) ([facility])   Renal angiomyolipoma   History of depression   History of anxiety   Hx of peripheral neuropathy   Dyslipidemia -dietary modification was discussed.  Essential hypertension -blood pressure is under good control.  Hx of colonic polyps   Orders: Orders Placed This Encounter  Procedures   CBC   COMPLETE METABOLIC PANEL WITH GFR   Meds ordered this encounter  Medications   hydroxychloroquine (PLAQUENIL) 200 MG tablet    Sig: Take 1 tablet (200 mg total) by mouth daily.    Dispense:  90 tablet    Refill:  0    Rheumatoid Arthritis    Face-to-face time spent with patient was 25 minutes. Greater than 50% of time was spent in counseling and coordination of care.  Follow-Up Instructions: Return in about 5 months (around 07/12/2019) for Rheumatoid arthritis.   Bo Merino, MD  Note - This record has been created using Editor, commissioning.  Chart creation errors have been sought, but may not always  have been located. Such creation errors do not reflect on  the standard of medical care.

## 2019-02-09 ENCOUNTER — Ambulatory Visit: Payer: PPO | Admitting: Rheumatology

## 2019-02-09 ENCOUNTER — Encounter: Payer: Self-pay | Admitting: Rheumatology

## 2019-02-09 ENCOUNTER — Other Ambulatory Visit: Payer: Self-pay

## 2019-02-09 VITALS — BP 119/70 | HR 64 | Resp 15 | Ht 67.0 in | Wt 204.2 lb

## 2019-02-09 DIAGNOSIS — M0579 Rheumatoid arthritis with rheumatoid factor of multiple sites without organ or systems involvement: Secondary | ICD-10-CM | POA: Diagnosis not present

## 2019-02-09 DIAGNOSIS — Z96659 Presence of unspecified artificial knee joint: Secondary | ICD-10-CM

## 2019-02-09 DIAGNOSIS — M797 Fibromyalgia: Secondary | ICD-10-CM

## 2019-02-09 DIAGNOSIS — D1771 Benign lipomatous neoplasm of kidney: Secondary | ICD-10-CM

## 2019-02-09 DIAGNOSIS — Z96652 Presence of left artificial knee joint: Secondary | ICD-10-CM

## 2019-02-09 DIAGNOSIS — N183 Chronic kidney disease, stage 3 unspecified: Secondary | ICD-10-CM

## 2019-02-09 DIAGNOSIS — T8459XS Infection and inflammatory reaction due to other internal joint prosthesis, sequela: Secondary | ICD-10-CM | POA: Diagnosis not present

## 2019-02-09 DIAGNOSIS — Z79899 Other long term (current) drug therapy: Secondary | ICD-10-CM | POA: Diagnosis not present

## 2019-02-09 DIAGNOSIS — I10 Essential (primary) hypertension: Secondary | ICD-10-CM

## 2019-02-09 DIAGNOSIS — Z860100 Personal history of colon polyps, unspecified: Secondary | ICD-10-CM

## 2019-02-09 DIAGNOSIS — Z8669 Personal history of other diseases of the nervous system and sense organs: Secondary | ICD-10-CM

## 2019-02-09 DIAGNOSIS — Z96641 Presence of right artificial hip joint: Secondary | ICD-10-CM

## 2019-02-09 DIAGNOSIS — E785 Hyperlipidemia, unspecified: Secondary | ICD-10-CM

## 2019-02-09 DIAGNOSIS — M51369 Other intervertebral disc degeneration, lumbar region without mention of lumbar back pain or lower extremity pain: Secondary | ICD-10-CM

## 2019-02-09 DIAGNOSIS — Z8659 Personal history of other mental and behavioral disorders: Secondary | ICD-10-CM

## 2019-02-09 DIAGNOSIS — Z96651 Presence of right artificial knee joint: Secondary | ICD-10-CM

## 2019-02-09 DIAGNOSIS — M5136 Other intervertebral disc degeneration, lumbar region: Secondary | ICD-10-CM

## 2019-02-09 DIAGNOSIS — M21372 Foot drop, left foot: Secondary | ICD-10-CM | POA: Diagnosis not present

## 2019-02-09 MED ORDER — HYDROXYCHLOROQUINE SULFATE 200 MG PO TABS: 200.0000 mg | ORAL_TABLET | Freq: Every day | ORAL | 0 refills | Status: DC

## 2019-02-10 LAB — COMPLETE METABOLIC PANEL WITH GFR
AG Ratio: 1.4 (calc) (ref 1.0–2.5)
ALT: 11 U/L (ref 6–29)
AST: 20 U/L (ref 10–35)
Albumin: 4.1 g/dL (ref 3.6–5.1)
Alkaline phosphatase (APISO): 93 U/L (ref 37–153)
BUN/Creatinine Ratio: 17 (calc) (ref 6–22)
BUN: 26 mg/dL — ABNORMAL HIGH (ref 7–25)
CO2: 26 mmol/L (ref 20–32)
Calcium: 9.4 mg/dL (ref 8.6–10.4)
Chloride: 105 mmol/L (ref 98–110)
Creat: 1.51 mg/dL — ABNORMAL HIGH (ref 0.60–0.93)
GFR, Est African American: 38 mL/min/{1.73_m2} — ABNORMAL LOW (ref >=60)
GFR, Est Non African American: 33 mL/min/{1.73_m2} — ABNORMAL LOW (ref >=60)
Globulin: 2.9 g/dL (calc) (ref 1.9–3.7)
Glucose, Bld: 92 mg/dL (ref 65–99)
Potassium: 4.8 mmol/L (ref 3.5–5.3)
Sodium: 141 mmol/L (ref 135–146)
Total Bilirubin: 0.8 mg/dL (ref 0.2–1.2)
Total Protein: 7 g/dL (ref 6.1–8.1)

## 2019-02-10 NOTE — Progress Notes (Signed)
stable °

## 2019-02-11 ENCOUNTER — Other Ambulatory Visit: Payer: Self-pay | Admitting: Family Medicine

## 2019-02-14 ENCOUNTER — Telehealth: Payer: Self-pay

## 2019-02-14 ENCOUNTER — Other Ambulatory Visit: Payer: Self-pay

## 2019-02-14 DIAGNOSIS — N644 Mastodynia: Secondary | ICD-10-CM

## 2019-02-14 NOTE — Telephone Encounter (Signed)
Copied from [place] [id number]. Topic: General - Inquiry >> Feb 14, 2019 11:47 AM Virl Axe D wrote: Reason for CRM: Pt is scheduled for a mammogram at [facility] [address] on 02/17/19. Pt stated they will need an order for an [place] just incase they have to do a diagnostic mammogram done. Pt does not want to have to go back for a diagnostic if they say she needs it. Please advise.

## 2019-02-14 NOTE — Telephone Encounter (Signed)
Called patient she is having right breast tenderness and history of fibroid. I have sent order to [facility] for diagnostic mammogram with b/l u/s

## 2019-02-15 ENCOUNTER — Telehealth: Payer: Self-pay | Admitting: Cardiology

## 2019-02-15 NOTE — Telephone Encounter (Signed)
Recall -  lmtcb 02-15-19 °

## 2019-02-17 ENCOUNTER — Other Ambulatory Visit: Payer: Self-pay | Admitting: Family Medicine

## 2019-02-17 DIAGNOSIS — N644 Mastodynia: Secondary | ICD-10-CM

## 2019-02-18 ENCOUNTER — Telehealth: Payer: Self-pay | Admitting: Family Medicine

## 2019-02-18 NOTE — Telephone Encounter (Signed)
Called patient l/m to let her know that orders were faxed on 8/10

## 2019-02-18 NOTE — Telephone Encounter (Signed)
Patient is calling regarding a referral that she would like placed to [facility] instead of [facility]. Please advise [phone] She has an appt for Tuesday 02/22/2019.

## 2019-02-22 DIAGNOSIS — N6489 Other specified disorders of breast: Secondary | ICD-10-CM | POA: Diagnosis not present

## 2019-02-22 LAB — HM MAMMOGRAPHY

## 2019-02-25 ENCOUNTER — Other Ambulatory Visit: Payer: Self-pay | Admitting: Family Medicine

## 2019-02-25 DIAGNOSIS — I10 Essential (primary) hypertension: Secondary | ICD-10-CM

## 2019-04-06 ENCOUNTER — Telehealth: Payer: Self-pay | Admitting: Family Medicine

## 2019-04-06 NOTE — Telephone Encounter (Signed)
I left a message asking the patient to call and schedule Medicare AWV with Courtney ([facility]).  If patient calls back, please schedule Medicare Wellness Visit at next available opening.  VDM (Dee-Dee) °

## 2019-04-13 ENCOUNTER — Ambulatory Visit: Payer: PPO

## 2019-04-19 ENCOUNTER — Ambulatory Visit (INDEPENDENT_AMBULATORY_CARE_PROVIDER_SITE_OTHER): Payer: PPO

## 2019-04-19 ENCOUNTER — Other Ambulatory Visit: Payer: Self-pay

## 2019-04-19 DIAGNOSIS — Z0000 Encounter for general adult medical examination without abnormal findings: Secondary | ICD-10-CM | POA: Diagnosis not present

## 2019-04-19 NOTE — Progress Notes (Signed)
This visit is being conducted via phone call due to the COVID-19 pandemic. This patient has given me verbal consent via phone to conduct this visit, patient states they are participating from their home address. Some vital signs may be absent or patient reported.   Patient identification: identified by name, DOB, and current address.   Subjective:   Laura Chambers is a 78 y.o. female who presents for Medicare Annual (Subsequent) preventive examination.  Review of Systems:   Cardiac Risk Factors include: dyslipidemia;sedentary lifestyle     Objective:     Vitals: There were no vitals taken for this visit.  There is no height or weight on file to calculate BMI.  Advanced Directives 04/19/2019 04/08/2018 06/10/2017 06/04/2017 04/03/2017 01/22/2017 01/12/2017  Does Patient Have a Medical Advance Directive? No No No No No Yes Yes  Type of Advance Directive - - - - - (No Data) (No Data)  Does patient want to make changes to medical advance directive? - - - - - No - Patient declined No - Patient declined  Would patient like information on creating a medical advance directive? Yes (MAU/Ambulatory/Procedural Areas - Information given) - No - Patient declined No - Patient declined Yes (MAU/Ambulatory/Procedural Areas - Information given) No - Patient declined No - Patient declined  Pre-existing out of facility DNR order (yellow form or pink MOST form) - - - - - - -    Tobacco Social History   Tobacco Use  Smoking Status Never Smoker  Smokeless Tobacco Never Used     Counseling given: Not Answered   Clinical Intake:  Pre-visit preparation completed: Yes  Pain : No/denies pain  Diabetes: No  How often do you need to have someone help you when you read instructions, pamphlets, or other written materials from your doctor or pharmacy?: 1 - Never  Interpreter Needed?: No  Information entered by :: Denman George LPN  Past Medical History:  Diagnosis Date  . Anemia   . Anxiety    . Arthritis   . Atelectasis, left    left lower lung  . Blood transfusion without reported diagnosis   . Cataract cortical, senile, bilateral 03/19/2018  . Complication of anesthesia 2012   "irritated trachea" with cough x 1 year from intubation 2012.  smaller tube used with other surgeries  . Depression    just lost her son a month ago  . Esophageal hernia   . Fibromyalgia   . Headache    occasionally  . HTN (hypertension)   . Hx of colonic polyps   . Hydronephrosis of right kidney   . Hypercholesterolemia   . Hypertensive retinopathy of both eyes 03/19/2018  . Neuropathy due to medical condition ([place])    bilateral lower legs  . Obesity   . 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 Osteoporosis   . Renal angiomyolipoma   . Rheumatoid arthritis [facility])    Past Surgical History:  Procedure Laterality Date  . ABDOMINAL HYSTERECTOMY  1969  . BACK SURGERY  2012   rod in back-  lumbar disc removal with bone graft  . blephoroplasty     bilateral  . BREAST BIOPSY  1989  . CHOLECYSTECTOMY  2010  . EXCISIONAL TOTAL KNEE ARTHROPLASTY WITH ANTIBIOTIC SPACERS Left 11/14/2016   Procedure: EXCISIONAL LEFT TOTAL KNEE ARTHROPLASTY WITH  PLACEMENT OF ANTIBIOTIC SPACERS;  Surgeon: [name], MD;  [facility];  Service: Orthopedics;  Laterality: Left;  . JOINT REPLACEMENT    .  NISSEN FUNDOPLICATION    . PARAESOPHAGEAL HERNIA REPAIR  1989  . REVISION TOTAL KNEE ARTHROPLASTY Left 06/09/2017  . TONSILLECTOMY  1946  . TOTAL HIP ARTHROPLASTY Right 08/31/2012   Procedure: TOTAL HIP ARTHROPLASTY ANTERIOR APPROACH;  Surgeon: Mauri Pole, MD;  [facility];  Service: Orthopedics;  Laterality: Right;  . TOTAL KNEE ARTHROPLASTY  2001   left  . TOTAL KNEE ARTHROPLASTY  2010   right  . TOTAL KNEE REVISION Left 06/09/2017   Procedure: LEFT REVISION KNEE ARTHROPLASTY;  Surgeon: [name], MD;  Location: [facility];  Service: Orthopedics;  Laterality:  Left;   Family History  Problem Relation Age of Onset  . Diabetes Father   . Osteoarthritis Father   . Heart disease Father   . Stroke Father   . Osteoarthritis Mother   . Stroke Mother   . Pneumonia Mother   . Colon cancer Sister 21  . Colon polyps Other        neice/nephew  . Diabetes Sister   . Diabetes Brother    Social History   Socioeconomic History  . Marital status: Divorced    Spouse name: Not on file  . Number of children: 3  . Years of education: College  . 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 Financial resource strain: Not on file  . Food insecurity    Worry: Not on file    Inability: Not on file  . Transportation needs    Medical: Not on file    Non-medical: Not on file  Tobacco Use  . Smoking status: Never Smoker  . Smokeless tobacco: Never Used  Substance and Sexual Activity  . Alcohol use: No  . Drug use: No  . Sexual activity: Never  Lifestyle  . Physical activity    Days per week: Not on file    Minutes per session: Not on file  . Stress: Not on file  Relationships  . Social Herbalist on phone: Not on file    Gets together: Not on file    Attends religious service: Not on file    Active member of club or organization: Not on file    Attends meetings of clubs or organizations: Not on file    Relationship status: Not on file  Other Topics Concern  . Not on file  Social History Narrative   Patient lives at home alone.   Caffeine Use: 1 cup daily    Outpatient Encounter Medications as of 04/19/2019  Medication Sig  . amLODipine (NORVASC) 10 MG tablet TAKE 1 TABLET (10 MG TOTAL) DAILY BY MOUTH.  Marland Kitchen atenolol (TENORMIN) 50 MG tablet TAKE 1 TABLET BY MOUTH EVERY DAY  . Calcium Carbonate-Vitamin D (CALCIUM 600+D) 600-400 MG-UNIT tablet Take 1 tablet by mouth every other day.  . ferrous sulfate 325 (65 FE) MG EC tablet Take 325 mg by  mouth 3 (three) times daily with meals.  Marland Kitchen FLUoxetine (PROZAC) 20 MG capsule Take 1 capsule (20 mg total) by mouth every morning.  . folic acid (FOLVITE) Q000111Q MCG tablet Take 800 mcg daily by mouth.   . furosemide (LASIX) 40 MG tablet Take 1 tablet (40 mg total) daily as needed by mouth for edema.  . gabapentin (NEURONTIN) 300 MG capsule TAKE 1 CAPSULE BY MOUTH 2 TIMES DAILY  . Ginger, Zingiber officinalis, (GINGER ROOT PO) Take by  mouth daily.  . hydroxychloroquine (PLAQUENIL) 200 MG tablet Take 1 tablet (200 mg total) by mouth daily.  . Misc Natural Products (TART CHERRY ADVANCED PO) Take by mouth.  . Multiple Vitamin (MULTIVITAMIN WITH MINERALS) TABS tablet Take 1 tablet by mouth every other day.  . mupirocin ointment (BACTROBAN) 2 % Apply 1 application topically 2 (two) times daily. (Patient not taking: Reported on 02/09/2019)  . Omega-3 1000 MG CAPS Take by mouth.  . promethazine (PHENERGAN) 25 MG tablet Take 1 tablet (25 mg total) by mouth every 8 (eight) hours as needed for nausea or vomiting.  . rosuvastatin (CRESTOR) 10 MG tablet TAKE 1 TABLET BY MOUTH DAILY IN EVENING  . tamsulosin (FLOMAX) 0.4 MG CAPS capsule Take 0.4 mg by mouth as needed.  . Turmeric 400 MG CAPS Take by mouth.   No facility-administered encounter medications on file as of 04/19/2019.     Activities of Daily Living In your present state of health, do you have any difficulty performing the following activities: 04/19/2019  Hearing? N  Vision? N  Difficulty concentrating or making decisions? N  Walking or climbing stairs? N  Dressing or bathing? N  Doing errands, shopping? N  Preparing Food and eating ? N  Using the Toilet? N  In the past six months, have you accidently leaked urine? N  Do you have problems with loss of bowel control? N  Managing your Medications? N  Managing your Finances? N  Housekeeping or managing your Housekeeping? N  Some recent data might be hidden    Patient Care Team: Briscoe Deutscher, DO as PCP - General (Family Medicine) Paralee Cancel, MD as Consulting Physician (Orthopedic Surgery) Hortencia Pilar, MD as Consulting Physician (Ophthalmology) Bo Merino, MD as Consulting Physician (Rheumatology)    Assessment:   This is a routine wellness examination for [place].  Exercise Activities and Dietary recommendations Current Exercise Habits: The patient does not participate in regular exercise at present  Goals    . Patient Stated     Want to get better and feel better        Fall Risk Fall Risk  04/19/2019 10/13/2018 04/08/2018 04/03/2017 01/27/2017  Falls in the past year? 0 1 Yes Yes No  Number falls in past yr: - 1 2 or more - -  Injury with Fall? 0 0 No - -  Comment - - got up to walk around the bed; fell backwards on bench at the foot of the bed. 2nd fall last month, BR at hs fell from bathroom, felt one of the legs gave away, balance  - -  Risk for fall due to : - History of fall(s) - - -  Risk for fall due to: Comment - Patient has drop foot.  - - -  Follow up Falls evaluation completed;Education provided;Falls prevention discussed - Education provided - -   Is the patient's home free of loose throw rugs in walkways, pet beds, electrical cords, etc?   yes      Grab bars in the bathroom? yes      Handrails on the stairs?   yes      Adequate lighting?   yes  Depression Screen PHQ 2/9 Scores 04/19/2019 10/13/2018 04/08/2018 04/08/2018  PHQ - 2 Score 0 0 1 1  PHQ- 9 Score - 0 - -     Cognitive Function-no cognitive concerns at this time  MMSE - Mini Mental State Exam 04/08/2018  Not completed: (No Data)  6CIT Screen 04/19/2019  What Year? 0 points  What month? 0 points  What time? 0 points  Count back from 20 0 points  Months in reverse 0 points  Repeat phrase 0 points  Total Score 0    Immunization History  Administered Date(s) Administered  . Influenza, High Dose Seasonal PF 03/27/2018  . Influenza,inj,Quad PF,6+ Mos 03/13/2017   . Influenza-Unspecified 03/26/2018  . Pneumococcal Conjugate-13 02/26/2018    Qualifies for Shingles Vaccine? Discussed and patient will check with pharmacy for coverage.  Patient education handout provided   Screening Tests Health Maintenance  Topic Date Due  . TETANUS/TDAP  05/19/1959  . DEXA SCAN  05/18/2005  . COLONOSCOPY  01/26/2019  . INFLUENZA VACCINE  02/05/2019  . PNA vac Low Risk Adult (2 of 2 - PPSV23) 02/27/2019    Cancer Screenings: Lung: Low Dose CT Chest recommended if Age 32-80 years, 30 pack-year currently smoking OR have quit w/in 15years. Patient does not qualify. Breast:  Up to date on Mammogram? Yes   Up to date of Bone Density/Dexa? No; will discuss with provider need  Colorectal: colonoscopy 01/25/14    Plan:  I have personally reviewed and addressed the Medicare Annual Wellness questionnaire and have noted the following in the patient's chart:  A. Medical and social history B. Use of alcohol, tobacco or illicit drugs  C. Current medications and supplements D. Functional ability and status E.  Nutritional status F.  Physical activity G. Advance directives H. List of other physicians I.  Hospitalizations, surgeries, and ER visits in previous 12 months J.  [place] such as hearing and vision if needed, cognitive and depression L. Referrals, records requested, and appointments- none   In addition, I have reviewed and discussed with patient certain preventive protocols, quality metrics, and best practice recommendations. A written personalized care plan for preventive services as well as general preventive health recommendations were provided to patient.   Signed,  Denman George, LPN  Nurse Health Advisor   Nurse Notes: no additional

## 2019-04-19 NOTE — Patient Instructions (Signed)
Laura Chambers , Thank you for taking time to come for your Medicare Wellness Visit. I appreciate your ongoing commitment to your health goals. Please review the following plan we discussed and let me know if I can assist you in the future.   Screening recommendations/referrals: Colorectal Screening: completed 01/25/14 Mammogram: up to date; last 02/22/19 Bone Density: please discuss need with provider  Vision and Dental Exams: Recommended annual ophthalmology exams for early detection of glaucoma and other disorders of the eye Recommended annual dental exams for proper oral hygiene  Vaccinations: Influenza vaccine: at next visit or sooner if appointment is after December Pneumococcal vaccine: up to date; last 02/26/18 (Pneumovax needed at next visit)  Tdap vaccine: recommended- Please call your insurance company to determine your out of pocket expense. You may also receive this vaccine at your local pharmacy or Health Dept. Shingles vaccine: Please call your insurance company to determine your out of pocket expense for the Shingrix vaccine. You may receive this vaccine at your local pharmacy.  Advanced directives: Advance directives discussed with you today. I have provided a copy for you to complete at home and have notarized. Once this is complete please bring a copy in to our office so we can scan it into your chart.  Goals: Recommend to drink at least 6-8 8oz glasses of water per day.  Recommend to begin DASH diet as directed in attached documents   Next appointment: Please schedule your Annual Wellness Visit with your Nurse Health Advisor in one year.  Preventive Care 35 Years and Older, Female Preventive care refers to lifestyle choices and visits with your health care provider that can promote health and wellness. What does preventive care include?  A yearly physical exam. This is also called an annual well check.  Dental exams once or twice a year.  Routine eye exams. Ask your  health care provider how often you should have your eyes checked.  Personal lifestyle choices, including:  Daily care of your teeth and gums.  Regular physical activity.  Eating a healthy diet.  Avoiding tobacco and drug use.  Limiting alcohol use.  Practicing safe sex.  Taking low-dose aspirin every day if recommended by your health care provider.  Taking vitamin and mineral supplements as recommended by your health care provider. What happens during an annual well check? The services and screenings done by your health care provider during your annual well check will depend on your age, overall health, lifestyle risk factors, and family history of disease. Counseling  Your health care provider may ask you questions about your:  Alcohol use.  Tobacco use.  Drug use.  Emotional well-being.  Home and relationship well-being.  Sexual activity.  Eating habits.  History of falls.  Memory and ability to understand (cognition).  Work and work Statistician.  Reproductive health. Screening  You may have the following tests or measurements:  Height, weight, and BMI.  Blood pressure.  Lipid and cholesterol levels. These may be checked every 5 years, or more frequently if you are over 94 years old.  Skin check.  Lung cancer screening. You may have this screening every year starting at age 81 if you have a 30-pack-year history of smoking and currently smoke or have quit within the past 15 years.  Fecal occult blood test (FOBT) of the stool. You may have this test every year starting at age 34.  Flexible sigmoidoscopy or colonoscopy. You may have a sigmoidoscopy every 5 years or a colonoscopy every 10 years  starting at age 58.  Hepatitis C blood test.  Hepatitis B blood test.  Sexually transmitted disease (STD) testing.  Diabetes screening. This is done by checking your blood sugar (glucose) after you have not eaten for a while (fasting). You may have this done  every 1-3 years.  Bone density scan. This is done to screen for osteoporosis. You may have this done starting at age 34.  Mammogram. This may be done every 1-2 years. Talk to your health care provider about how often you should have regular mammograms. Talk with your health care provider about your test results, treatment options, and if necessary, the need for more tests. Vaccines  Your health care provider may recommend certain vaccines, such as:  Influenza vaccine. This is recommended every year.  Tetanus, diphtheria, and acellular pertussis (Tdap, Td) vaccine. You may need a Td booster every 10 years.  Zoster vaccine. You may need this after age 20.  Pneumococcal 13-valent conjugate (PCV13) vaccine. One dose is recommended after age 63.  Pneumococcal polysaccharide (PPSV23) vaccine. One dose is recommended after age 58. Talk to your health care provider about which screenings and vaccines you need and how often you need them. This information is not intended to replace advice given to you by your health care provider. Make sure you discuss any questions you have with your health care provider. Document Released: 07/20/2015 Document Revised: 03/12/2016 Document Reviewed: 04/24/2015 Elsevier Interactive Patient Education  2017 [place] Prevention in the Home Falls can cause injuries. They can happen to people of all ages. There are many things you can do to make your home safe and to help prevent falls. What can I do on the outside of my home?  Regularly fix the edges of walkways and driveways and fix any cracks.  Remove anything that might make you trip as you walk through a door, such as a raised step or threshold.  Trim any bushes or trees on the path to your home.  Use bright outdoor lighting.  Clear any walking paths of anything that might make someone trip, such as rocks or tools.  Regularly check to see if handrails are loose or broken. Make sure that both  sides of any steps have handrails.  Any raised decks and porches should have guardrails on the edges.  Have any leaves, snow, or ice cleared regularly.  Use sand or salt on walking paths during winter.  Clean up any spills in your garage right away. This includes oil or grease spills. What can I do in the bathroom?  Use night lights.  Install grab bars by the toilet and in the tub and shower. Do not use towel bars as grab bars.  Use non-skid mats or decals in the tub or shower.  If you need to sit down in the shower, use a plastic, non-slip stool.  Keep the floor dry. Clean up any water that spills on the floor as soon as it happens.  Remove soap buildup in the tub or shower regularly.  Attach bath mats securely with double-sided non-slip rug tape.  Do not have throw rugs and other things on the floor that can make you trip. What can I do in the bedroom?  Use night lights.  Make sure that you have a light by your bed that is easy to reach.  Do not use any sheets or blankets that are too big for your bed. They should not hang down onto the floor.  Have a  firm chair that has side arms. You can use this for support while you get dressed.  Do not have throw rugs and other things on the floor that can make you trip. What can I do in the kitchen?  Clean up any spills right away.  Avoid walking on wet floors.  Keep items that you use a lot in easy-to-reach places.  If you need to reach something above you, use a strong step stool that has a grab bar.  Keep electrical cords out of the way.  Do not use floor polish or wax that makes floors slippery. If you must use wax, use non-skid floor wax.  Do not have throw rugs and other things on the floor that can make you trip. What can I do with my stairs?  Do not leave any items on the stairs.  Make sure that there are handrails on both sides of the stairs and use them. Fix handrails that are broken or loose. Make sure that  handrails are as long as the stairways.  Check any carpeting to make sure that it is firmly attached to the stairs. Fix any carpet that is loose or worn.  Avoid having throw rugs at the top or bottom of the stairs. If you do have throw rugs, attach them to the floor with carpet tape.  Make sure that you have a light switch at the top of the stairs and the bottom of the stairs. If you do not have them, ask someone to add them for you. What else can I do to help prevent falls?  Wear shoes that:  Do not have high heels.  Have rubber bottoms.  Are comfortable and fit you well.  Are closed at the toe. Do not wear sandals.  If you use a stepladder:  Make sure that it is fully opened. Do not climb a closed stepladder.  Make sure that both sides of the stepladder are locked into place.  Ask someone to hold it for you, if possible.  Clearly mark and make sure that you can see:  Any grab bars or handrails.  First and last steps.  Where the edge of each step is.  Use tools that help you move around (mobility aids) if they are needed. These include:  Canes.  Walkers.  Scooters.  Crutches.  Turn on the lights when you go into a dark area. Replace any light bulbs as soon as they burn out.  Set up your furniture so you have a clear path. Avoid moving your furniture around.  If any of your floors are uneven, fix them.  If there are any pets around you, be aware of where they are.  Review your medicines with your doctor. Some medicines can make you feel dizzy. This can increase your chance of falling. Ask your doctor what other things that you can do to help prevent falls. This information is not intended to replace advice given to you by your health care provider. Make sure you discuss any questions you have with your health care provider. Document Released: 04/19/2009 Document Revised: 11/29/2015 Document Reviewed: 07/28/2014 Elsevier Interactive Patient Education  2017  [employer].

## 2019-04-20 NOTE — Progress Notes (Signed)
I have reviewed documentation for AWV and Advance Care planning provided by [facility], I agree with documentation, I was immediately available for any questions. Jhalil Silvera, DO   

## 2019-04-26 ENCOUNTER — Ambulatory Visit (INDEPENDENT_AMBULATORY_CARE_PROVIDER_SITE_OTHER): Payer: PPO | Admitting: *Deleted

## 2019-04-26 ENCOUNTER — Encounter: Payer: Self-pay | Admitting: Family Medicine

## 2019-04-26 ENCOUNTER — Other Ambulatory Visit: Payer: Self-pay

## 2019-04-26 DIAGNOSIS — Z23 Encounter for immunization: Secondary | ICD-10-CM | POA: Diagnosis not present

## 2019-05-04 ENCOUNTER — Other Ambulatory Visit: Payer: Self-pay | Admitting: Rheumatology

## 2019-05-04 ENCOUNTER — Other Ambulatory Visit: Payer: Self-pay | Admitting: Family Medicine

## 2019-05-04 DIAGNOSIS — E785 Hyperlipidemia, unspecified: Secondary | ICD-10-CM

## 2019-05-04 DIAGNOSIS — M0579 Rheumatoid arthritis with rheumatoid factor of multiple sites without organ or systems involvement: Secondary | ICD-10-CM

## 2019-05-04 NOTE — Telephone Encounter (Signed)
Last Visit: 02/09/19 Next Visit: 07/13/19 Labs: 02/09/19 cmp stable 04/23/18 cbc stable Eye Exam: 03/19/18  Left message for patient to call the office. Patient will need an updated CBC   Okay to refill 30 day supply PLQ?

## 2019-05-04 NOTE — Telephone Encounter (Signed)
ok 

## 2019-05-08 ENCOUNTER — Other Ambulatory Visit: Payer: Self-pay | Admitting: Family Medicine

## 2019-05-08 DIAGNOSIS — F329 Major depressive disorder, single episode, unspecified: Secondary | ICD-10-CM

## 2019-05-11 ENCOUNTER — Ambulatory Visit (INDEPENDENT_AMBULATORY_CARE_PROVIDER_SITE_OTHER): Payer: PPO | Admitting: Family Medicine

## 2019-05-11 ENCOUNTER — Encounter: Payer: Self-pay | Admitting: Family Medicine

## 2019-05-11 DIAGNOSIS — M0579 Rheumatoid arthritis with rheumatoid factor of multiple sites without organ or systems involvement: Secondary | ICD-10-CM | POA: Diagnosis not present

## 2019-05-11 DIAGNOSIS — F329 Major depressive disorder, single episode, unspecified: Secondary | ICD-10-CM

## 2019-05-11 DIAGNOSIS — M797 Fibromyalgia: Secondary | ICD-10-CM

## 2019-05-11 DIAGNOSIS — I10 Essential (primary) hypertension: Secondary | ICD-10-CM | POA: Diagnosis not present

## 2019-05-11 DIAGNOSIS — G6289 Other specified polyneuropathies: Secondary | ICD-10-CM

## 2019-05-11 DIAGNOSIS — M21372 Foot drop, left foot: Secondary | ICD-10-CM | POA: Diagnosis not present

## 2019-05-11 DIAGNOSIS — N183 Chronic kidney disease, stage 3 unspecified: Secondary | ICD-10-CM

## 2019-05-11 NOTE — Progress Notes (Signed)
TELEPHONE ENCOUNTER   Patient verbally agreed to telephone visit and is aware that copayment and coinsurance may apply. Patient was treated using telemedicine according to accepted telemedicine protocols.  Location of the patient: home Location of provider: [facility], [place] of all persons participating in the telemedicine service and role in the encounter: Leamon Arnt, MD Lilli Light, CMA   Subjective  CC:  Chief Complaint  Patient presents with  . Transitions Of Care  . Hypertension  . Chronic Kidney Disease    HPI: Laura Chambers is a 78 y.o. female who presents to the office today to address the problems listed above in the chief complaint.  Hypertension f/u: Control is good . Pt reports she is doing well. taking medications as instructed, no medication side effects noted, no TIAs, no chest pain on exertion, no dyspnea on exertion, no swelling of ankles. Took bp today and was normal. Has chronic kidney disease on lasix. Recent cmp shows normal potassium and stable creat at 1.5.  She denies adverse effects from his BP medications. Compliance with medication is good.   Reviewed PL in detail and updated. Reviewed recent rheum notes and chart. No new concerns. Feels well.   Had AWV last month; reviewed.  HM: due pneumovax, dexa, and colonoscopy but defers due to covid restrictions. Had recent normal mammogram.   Assessment  1. Essential hypertension   2. Chronic kidney disease (CKD), active medical management without dialysis, stage 3 (moderate)   3. Fibromyalgia   4. Rheumatoid arthritis involving multiple sites with positive rheumatoid factor ([place])   5. Reactive depression (situational)   6. Other polyneuropathy   7. Left foot drop      Plan   This telephone visit lasted 25 minutes discussing her care.    Hypertension f/u: BP control is well controlled. Continue meds  Hyperlipidemia f/u: on statin and due for lipid recheck. Defers  till next year. lfts are normal.   Rheum manages fibro and RA  CKD: stable. Will follow. Avoid nephrotoxins.   Depression: prozac and stable.   No changes today; but scheduled visit for pneumovax.  Will defer GI referral for colonoscopy and dexa until next year. Education regarding management of these chronic disease states was given. Management strategies discussed on successive visits include dietary and exercise recommendations, goals of achieving and maintaining IBW, and lifestyle modifications aiming for adequate sleep and minimizing stressors.   Follow up: as scheduled for cpe in 3-4 months   I reviewed the patients updated PMH, FH, and SocHx.    Patient Active Problem List   Diagnosis Date Noted  . Rheumatoid arthritis involving multiple sites with positive rheumatoid factor ([place]) 10/13/2018    Priority: High  . Chronic kidney disease (CKD), active medical management without dialysis, stage 3 (moderate) 12/15/2017    Priority: High  . Fibromyalgia 12/15/2017    Priority: High  . Reactive depression (situational) 08/17/2017    Priority: High  . Essential hypertension 12/08/2016    Priority: High  . Peripheral neuropathy 01/12/2014    Priority: High  . Benign hypertensive heart disease without heart failure 06/16/2012    Priority: High  . Dyslipidemia 06/16/2012    Priority: High  . Hx of colonic polyps 12/15/2017    Priority: Medium  . 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 DDD (degenerative disc disease), lumbar 12/15/2017    Priority: Medium  . Obesity, Class III, BMI 40-49.9 (morbid obesity) ([place]) 12/28/2016  Priority: Medium  . History of total right hip replacement 12/15/2017    Priority: Low  . S/P total knee replacement, right 12/15/2017    Priority: Low  . Left foot drop 12/15/2017    Priority: Low  . Status post revision of total knee, left 06/09/2017    Priority: Low   Current Meds  Medication Sig  . amLODipine (NORVASC) 10 MG  tablet TAKE 1 TABLET (10 MG TOTAL) DAILY BY MOUTH.  Marland Kitchen atenolol (TENORMIN) 50 MG tablet TAKE 1 TABLET BY MOUTH EVERY DAY  . Calcium Carbonate-Vitamin D (CALCIUM 600+D) 600-400 MG-UNIT tablet Take 1 tablet by mouth every other day.  . ferrous sulfate 325 (65 FE) MG EC tablet Take 325 mg by mouth 3 (three) times daily with meals.  Marland Kitchen FLUoxetine (PROZAC) 20 MG capsule TAKE 1 CAPSULE BY MOUTH EVERY DAY IN THE MORNING  . folic acid (FOLVITE) Q000111Q MCG tablet Take 800 mcg daily by mouth.   . gabapentin (NEURONTIN) 300 MG capsule TAKE 1 CAPSULE BY MOUTH 2 TIMES DAILY  . Ginger, Zingiber officinalis, (GINGER ROOT PO) Take by mouth daily.  . hydroxychloroquine (PLAQUENIL) 200 MG tablet TAKE 1 TABLET BY MOUTH EVERY DAY  . Misc Natural Products (TART CHERRY ADVANCED PO) Take by mouth.  . Multiple Vitamin (MULTIVITAMIN WITH MINERALS) TABS tablet Take 1 tablet by mouth every other day.  . Omega-3 1000 MG CAPS Take by mouth.  . promethazine (PHENERGAN) 25 MG tablet Take 1 tablet (25 mg total) by mouth every 8 (eight) hours as needed for nausea or vomiting.  . rosuvastatin (CRESTOR) 10 MG tablet TAKE 1 TABLET BY MOUTH DAILY IN EVENING  . tamsulosin (FLOMAX) 0.4 MG CAPS capsule Take 0.4 mg by mouth as needed.  . Turmeric 400 MG CAPS Take by mouth.    Allergies: Patient is allergic to ativan [lorazepam]; haldol [haloperidol]; epinephrine; levofloxacin; lipitor [atorvastatin]; and trovan [alatrofloxacin]. Family History: Patient family history includes Colon cancer (age of onset: 24) in her sister; Colon polyps in an other family member; Diabetes in her brother, father, and sister; Heart disease in her father; Osteoarthritis in her father and mother; Pneumonia in her mother; Stroke in her father and mother. Social History:  Patient  reports that she has never smoked. She has never used smokeless tobacco. She reports that she does not drink alcohol or use drugs.  Review of Systems: Constitutional: Negative for  fever malaise or anorexia Cardiovascular: negative for chest pain Respiratory: negative for SOB or persistent cough Gastrointestinal: negative for abdominal pain   No visits with results within 1 Day(s) from this visit.  Latest known visit with results is:  Abstract on 02/23/2019  Component Date Value Ref Range Status  . HM Mammogram 02/22/2019 0-4 Bi-Rad  0-4 Bi-Rad, Self Reported Normal Final   Lab Results  Component Value Date   CHOL 140 08/17/2017   HDL 39.10 08/17/2017   LDLCALC 57 04/15/2016   LDLDIRECT 58.0 08/17/2017   TRIG 212.0 (H) 08/17/2017   CHOLHDL 4 08/17/2017   BP Readings from Last 3 Encounters:  02/09/19 119/70  07/22/18 119/72  05/13/18 114/65     [id number] physician/qualify he will professional telephone evaluation for 21 to 30 minutes

## 2019-05-17 ENCOUNTER — Ambulatory Visit (INDEPENDENT_AMBULATORY_CARE_PROVIDER_SITE_OTHER): Payer: PPO

## 2019-05-17 ENCOUNTER — Other Ambulatory Visit: Payer: Self-pay

## 2019-05-17 DIAGNOSIS — Z23 Encounter for immunization: Secondary | ICD-10-CM | POA: Diagnosis not present

## 2019-05-27 ENCOUNTER — Other Ambulatory Visit: Payer: Self-pay

## 2019-05-27 ENCOUNTER — Telehealth: Payer: Self-pay | Admitting: Rheumatology

## 2019-05-27 DIAGNOSIS — Z79899 Other long term (current) drug therapy: Secondary | ICD-10-CM

## 2019-05-27 NOTE — Telephone Encounter (Signed)
Advised patient to get an eye appointment asap for PLQ eye exam. Patient states her eye doctor is closed according to the voicemail. Patient states if they are still closed next week, she will call Dr. Patrici Ranks office to schedule an appointment.   Patient will come in today for labs, patient will get CBC and CMP drawn today so labs are back on the same schedule.

## 2019-05-27 NOTE — Telephone Encounter (Signed)
Patient called stating she was scheduled for her Plaquenil eye exam today 11/20 at 2:20 pm but received a voicemail that her appointment was cancelled.  Patient states when she tried to return the call the message states the office is closed.  Patient is asking if Dr. Estanislado Pandy will be able to refill her prescription of Plaquenil until she is able to reschedule her eye exam.    Patient states she is also requesting a return call to let her know if she is due for labwork.

## 2019-05-28 LAB — CBC WITH DIFFERENTIAL/PLATELET
Absolute Monocytes: 774 cells/uL (ref 200–950)
Basophils Absolute: 61 cells/uL (ref 0–200)
Basophils Relative: 0.7 %
Eosinophils Absolute: 305 cells/uL (ref 15–500)
Eosinophils Relative: 3.5 %
HCT: 38 % (ref 35.0–45.0)
Hemoglobin: 11.9 g/dL (ref 11.7–15.5)
Lymphs Abs: 1784 cells/uL (ref 850–3900)
MCH: 26.6 pg — ABNORMAL LOW (ref 27.0–33.0)
MCHC: 31.3 g/dL — ABNORMAL LOW (ref 32.0–36.0)
MCV: 85 fL (ref 80.0–100.0)
MPV: 11.4 fL (ref 7.5–12.5)
Monocytes Relative: 8.9 %
Neutro Abs: 5777 cells/uL (ref 1500–7800)
Neutrophils Relative %: 66.4 %
Platelets: 288 10*3/uL (ref 140–400)
RBC: 4.47 10*6/uL (ref 3.80–5.10)
RDW: 13 % (ref 11.0–15.0)
Total Lymphocyte: 20.5 %
WBC: 8.7 10*3/uL (ref 3.8–10.8)

## 2019-05-28 LAB — COMPLETE METABOLIC PANEL WITH GFR
AG Ratio: 1.3 (calc) (ref 1.0–2.5)
ALT: 9 U/L (ref 6–29)
AST: 17 U/L (ref 10–35)
Albumin: 3.8 g/dL (ref 3.6–5.1)
Alkaline phosphatase (APISO): 101 U/L (ref 37–153)
BUN/Creatinine Ratio: 16 (calc) (ref 6–22)
BUN: 24 mg/dL (ref 7–25)
CO2: 25 mmol/L (ref 20–32)
Calcium: 9.3 mg/dL (ref 8.6–10.4)
Chloride: 105 mmol/L (ref 98–110)
Creat: 1.5 mg/dL — ABNORMAL HIGH (ref 0.60–0.93)
GFR, Est African American: 38 mL/min/{1.73_m2} — ABNORMAL LOW (ref >=60)
GFR, Est Non African American: 33 mL/min/{1.73_m2} — ABNORMAL LOW (ref >=60)
Globulin: 3 g/dL (calc) (ref 1.9–3.7)
Glucose, Bld: 86 mg/dL (ref 65–99)
Potassium: 4.5 mmol/L (ref 3.5–5.3)
Sodium: 142 mmol/L (ref 135–146)
Total Bilirubin: 0.6 mg/dL (ref 0.2–1.2)
Total Protein: 6.8 g/dL (ref 6.1–8.1)

## 2019-06-09 ENCOUNTER — Other Ambulatory Visit: Payer: Self-pay | Admitting: Rheumatology

## 2019-06-09 DIAGNOSIS — M0579 Rheumatoid arthritis with rheumatoid factor of multiple sites without organ or systems involvement: Secondary | ICD-10-CM

## 2019-06-09 NOTE — Telephone Encounter (Signed)
Last Visit: 02/09/2019 Next Visit: 07/13/2019 Labs: 05/27/2019 Creatinine is elevated and GFR is low but stable. Patient is taking the reduced dose of PLQ. Rest of CMP WNL. CBC stable. Eye exam: 03/19/2018   Advised patient on 05/27/2019 she is due to update eye exam asap. Patient is trying to get it scheduled.    Okay to refill 30 day supply, per Dr. Estanislado Pandy.

## 2019-06-13 DIAGNOSIS — H35033 Hypertensive retinopathy, bilateral: Secondary | ICD-10-CM | POA: Diagnosis not present

## 2019-06-13 DIAGNOSIS — H2513 Age-related nuclear cataract, bilateral: Secondary | ICD-10-CM | POA: Diagnosis not present

## 2019-06-13 DIAGNOSIS — M069 Rheumatoid arthritis, unspecified: Secondary | ICD-10-CM | POA: Diagnosis not present

## 2019-06-13 DIAGNOSIS — H04123 Dry eye syndrome of bilateral lacrimal glands: Secondary | ICD-10-CM | POA: Diagnosis not present

## 2019-06-22 ENCOUNTER — Other Ambulatory Visit: Payer: Self-pay | Admitting: Family Medicine

## 2019-06-22 DIAGNOSIS — E785 Hyperlipidemia, unspecified: Secondary | ICD-10-CM

## 2019-06-27 NOTE — Telephone Encounter (Signed)
Pt is out of medication. rx was sent to dr Juleen China

## 2019-06-29 NOTE — Telephone Encounter (Signed)
error 

## 2019-07-04 ENCOUNTER — Other Ambulatory Visit: Payer: Self-pay

## 2019-07-04 DIAGNOSIS — E785 Hyperlipidemia, unspecified: Secondary | ICD-10-CM

## 2019-07-04 MED ORDER — ROSUVASTATIN CALCIUM 10 MG PO TABS: 10.0000 mg | ORAL_TABLET | Freq: Every day | ORAL | 0 refills | Status: DC

## 2019-07-11 ENCOUNTER — Other Ambulatory Visit: Payer: Self-pay | Admitting: Rheumatology

## 2019-07-11 DIAGNOSIS — M0579 Rheumatoid arthritis with rheumatoid factor of multiple sites without organ or systems involvement: Secondary | ICD-10-CM

## 2019-07-11 NOTE — Telephone Encounter (Addendum)
Last Visit: 02/09/2019 Next Visit: 07/13/2019 Labs: 05/27/2019 Creatinine is elevated and GFR is low but stable. Patient is taking the reduced dose of PLQ. Rest of CMP WNL. CBC stable. Eye exam:06/13/19 WNL  Okay to refill per Dr. Estanislado Pandy

## 2019-07-11 NOTE — Progress Notes (Signed)
Virtual Visit via Telephone Note  I connected with Laura Chambers on 07/13/19 at  1:30 PM EST by telephone and verified that I am speaking with the correct person using two identifiers.  Location: Patient: [facility]me Provider: Clinic This service was conducted via virtual visit.  The patient was located at home. I was located in my office.  Consent was obtained prior to the virtual visit and is aware of possible charges through their insurance for this visit.  The patient is an established patient.  Dr. Estanislado Pandy, MD conducted the virtual visit and Hazel Sams, PA-C acted as scribe during the service.  Office staff helped with scheduling follow up visits after the service was conducted.   I discussed the limitations, risks, security and privacy concerns of performing an evaluation and management service by telephone and the availability of in person appointments. I also discussed with the patient that there may be a patient responsible charge related to this service. The patient expressed understanding and agreed to proceed.  CC: Medication monitoring  History of Present Illness: Laura Chambers is a 79 y.o. female with history of seropositive rheumatoid arthritis.  She is taking plaquenil 200 mg 1 tablet by mouth daily.   She denies any recent rheumatoid arthritis flares. She denies any joint pain or joint swelling currently.She states her right hip replacement and bilateral knee replacements are doing well overall.  She experiences morning stiffness in both knees for 15 minutes daily.   Review of Systems  Constitutional: Negative for fever and malaise/fatigue.  HENT: Negative for congestion.   Eyes: Negative for photophobia, pain, discharge and redness.  Respiratory: Negative for cough, shortness of breath and wheezing.   Cardiovascular: Negative for chest pain and palpitations.  Gastrointestinal: Negative for blood in stool, constipation and diarrhea.  Genitourinary: Negative for dysuria and  urgency.  Musculoskeletal: Negative for back pain, joint pain, myalgias and neck pain.  Skin: Negative for rash.  Neurological: Negative for dizziness and headaches.  Endo/Heme/Allergies: Does not bruise/bleed easily.  Psychiatric/Behavioral: Negative for depression and memory loss. The patient is not nervous/anxious and does not have insomnia.      Observations/Objective: Physical Exam  Constitutional: She is oriented to person, place, and time.  Neurological: She is alert and oriented to person, place, and time.  Psychiatric: Mood, memory, affect and judgment normal.   Patient reports morning stiffness for  15 minutes.   Patient denies nocturnal pain.  Difficulty dressing/grooming: Denies Difficulty climbing stairs: Reports Difficulty getting out of chair: Reports Difficulty using hands for taps, buttons, cutlery, and/or writing: Denies  She checked her BP this morning and it was 128/74.    Assessment and Plan: Visit Diagnoses: Rheumatoid arthritis involving multiple sites with positive rheumatoid factor ([place]) -She has not had any recent rheumatoid arthritis flares.  She has no joint pain or joint inflammation at this time. She has morning stiffness for about 15 minutes daily.  She is clinically doing well on Plaquenil 200 mg 1 tablet by mouth daily. She has not missed any doses recently.  She will continue on the current treatment regimen.  A refill of PLQ was sent to the pharmacy yesterday.  She was advised to notify us if she develops increased joint pain or joint swelling.  She will follow up in 4-5 months.   High risk medication use - PLQ 200 mg 1 tablet by mouth daily (Low GFR)-initially experienced GI SE which have resolved.  PLQ eye exam: 03/19/18.  She is due to update  lab work. CBC and CMP were drawn on 05/27/19.  She will be due to update lab work in April 2021.   History of total right hip replacement: Doing well. She has no discomfort at this time.   S/P total knee  replacement, right: Doing well.  She experiences stiffness for about 15 minutes in the morning.   Status post revision of total knee, left: Doing well.  She has morning stiffness for about 15 minutes.   Infected prosthetic knee joint, sequela   DDD (degenerative disc disease), lumbar: She is not having any lower back pain currently. She has no symptoms of radiculopathy.   Left foot drop   Fibromyalgia: Her fibromyalgia pain has been tolerable recently.  Other medical conditions are listed as follows:   Chronic kidney disease (CKD), active medical management without dialysis, stage 3 (moderate) ([place])   Renal angiomyolipoma   History of depression   History of anxiety   Hx of peripheral neuropathy   Dyslipidemia   Essential hypertension   Hx of colonic polyps  Follow Up Instructions: She will follow up in 4-5 months.    I discussed the assessment and treatment plan with the patient. The patient was provided an opportunity to ask questions and all were answered. The patient agreed with the plan and demonstrated an understanding of the instructions.   The patient was advised to call back or seek an in-person evaluation if the symptoms worsen or if the condition fails to improve as anticipated.  I provided 15 minutes of non-face-to-face time during this encounter.  Bo Merino, MD   Scribed by-  Hazel Sams, PA-C

## 2019-07-13 ENCOUNTER — Other Ambulatory Visit: Payer: Self-pay

## 2019-07-13 ENCOUNTER — Telehealth (INDEPENDENT_AMBULATORY_CARE_PROVIDER_SITE_OTHER): Payer: PPO | Admitting: Rheumatology

## 2019-07-13 ENCOUNTER — Encounter: Payer: Self-pay | Admitting: Rheumatology

## 2019-07-13 DIAGNOSIS — Z860100 Personal history of colon polyps, unspecified: Secondary | ICD-10-CM

## 2019-07-13 DIAGNOSIS — M21372 Foot drop, left foot: Secondary | ICD-10-CM

## 2019-07-13 DIAGNOSIS — Z96651 Presence of right artificial knee joint: Secondary | ICD-10-CM | POA: Diagnosis not present

## 2019-07-13 DIAGNOSIS — Z79899 Other long term (current) drug therapy: Secondary | ICD-10-CM | POA: Diagnosis not present

## 2019-07-13 DIAGNOSIS — Z96652 Presence of left artificial knee joint: Secondary | ICD-10-CM

## 2019-07-13 DIAGNOSIS — M797 Fibromyalgia: Secondary | ICD-10-CM | POA: Diagnosis not present

## 2019-07-13 DIAGNOSIS — D1771 Benign lipomatous neoplasm of kidney: Secondary | ICD-10-CM | POA: Diagnosis not present

## 2019-07-13 DIAGNOSIS — Z96641 Presence of right artificial hip joint: Secondary | ICD-10-CM

## 2019-07-13 DIAGNOSIS — M0579 Rheumatoid arthritis with rheumatoid factor of multiple sites without organ or systems involvement: Secondary | ICD-10-CM

## 2019-07-13 DIAGNOSIS — N183 Chronic kidney disease, stage 3 unspecified: Secondary | ICD-10-CM | POA: Diagnosis not present

## 2019-07-13 DIAGNOSIS — T8459XS Infection and inflammatory reaction due to other internal joint prosthesis, sequela: Secondary | ICD-10-CM | POA: Diagnosis not present

## 2019-07-13 DIAGNOSIS — M5136 Other intervertebral disc degeneration, lumbar region: Secondary | ICD-10-CM | POA: Diagnosis not present

## 2019-07-13 DIAGNOSIS — Z8659 Personal history of other mental and behavioral disorders: Secondary | ICD-10-CM

## 2019-07-13 DIAGNOSIS — I10 Essential (primary) hypertension: Secondary | ICD-10-CM

## 2019-07-13 DIAGNOSIS — Z96659 Presence of unspecified artificial knee joint: Secondary | ICD-10-CM

## 2019-07-13 DIAGNOSIS — E785 Hyperlipidemia, unspecified: Secondary | ICD-10-CM

## 2019-07-13 DIAGNOSIS — Z8669 Personal history of other diseases of the nervous system and sense organs: Secondary | ICD-10-CM

## 2019-07-13 DIAGNOSIS — M51369 Other intervertebral disc degeneration, lumbar region without mention of lumbar back pain or lower extremity pain: Secondary | ICD-10-CM

## 2019-07-27 ENCOUNTER — Telehealth: Payer: Self-pay | Admitting: Rheumatology

## 2019-07-27 NOTE — Telephone Encounter (Signed)
-----   Message from [place] sent at 07/13/2019  3:42 PM EST ----- Patient due for follow up in 4-5 months. Thanks!

## 2019-07-27 NOTE — Telephone Encounter (Signed)
I LMOM for patient to call, and schedule a follow up appointment for around 12/11/2019.

## 2019-08-04 ENCOUNTER — Telehealth: Payer: Self-pay | Admitting: Rheumatology

## 2019-08-04 ENCOUNTER — Other Ambulatory Visit: Payer: Self-pay | Admitting: Family Medicine

## 2019-08-04 ENCOUNTER — Other Ambulatory Visit: Payer: Self-pay

## 2019-08-04 DIAGNOSIS — I10 Essential (primary) hypertension: Secondary | ICD-10-CM

## 2019-08-04 DIAGNOSIS — F329 Major depressive disorder, single episode, unspecified: Secondary | ICD-10-CM

## 2019-08-04 MED ORDER — AMLODIPINE BESYLATE 10 MG PO TABS: ORAL_TABLET | ORAL | 1 refills | Status: DC

## 2019-08-04 NOTE — Telephone Encounter (Signed)
Left message to advise patient she is due for labs in April 2021.

## 2019-08-04 NOTE — Telephone Encounter (Signed)
Patient called requesting a return call to let her know when she is due for labwork.   

## 2019-08-22 ENCOUNTER — Other Ambulatory Visit: Payer: Self-pay

## 2019-08-22 MED ORDER — GABAPENTIN 300 MG PO CAPS: ORAL_CAPSULE | ORAL | 1 refills | Status: DC

## 2019-09-14 ENCOUNTER — Encounter: Payer: PPO | Admitting: Family Medicine

## 2019-09-18 ENCOUNTER — Other Ambulatory Visit: Payer: Self-pay | Admitting: Family Medicine

## 2019-09-18 DIAGNOSIS — E785 Hyperlipidemia, unspecified: Secondary | ICD-10-CM

## 2019-10-04 ENCOUNTER — Other Ambulatory Visit: Payer: Self-pay | Admitting: Rheumatology

## 2019-10-04 DIAGNOSIS — M0579 Rheumatoid arthritis with rheumatoid factor of multiple sites without organ or systems involvement: Secondary | ICD-10-CM

## 2019-10-04 NOTE — Telephone Encounter (Signed)
Last Visit:02/09/2019 Next Visit:07/13/2019 Labs:11/20/2020Creatinine is elevated and GFR is low but stable. Patient is taking the reduced dose of PLQ. Rest of CMP WNL. CBC stable. Eye exam:06/13/19 WNL  Current Dose per office note on 07/13/19: PLQ 200 mg 1 tablet by mouth daily   Okay to refill per Dr. Estanislado Pandy

## 2019-10-17 ENCOUNTER — Telehealth: Payer: Self-pay | Admitting: Family Medicine

## 2019-10-17 ENCOUNTER — Other Ambulatory Visit: Payer: Self-pay

## 2019-10-17 MED ORDER — PROMETHAZINE HCL 25 MG PO TABS: 25.0000 mg | ORAL_TABLET | Freq: Three times a day (TID) | ORAL | 0 refills | Status: DC | PRN

## 2019-10-17 NOTE — Telephone Encounter (Signed)
Pt is due for a cpe visit; she has not yet scheduled.  I only saw her virtually for a toc visit back in November.  Please ask her to schedule her cpe.  Please clarify what the phenergan is for: I see it on her chronic med list but am not aware of the diagnosis or use.  thanks

## 2019-10-17 NOTE — Telephone Encounter (Signed)
  LAST APPOINTMENT DATE: 09/18/2019   NEXT APPOINTMENT DATE:@Visit  date not found  MEDICATION: Phenergan   PHARMACY: [id number] - [place], [place] [address] Phone:  [phone]  Fax:  [phone]

## 2019-10-17 NOTE — Telephone Encounter (Signed)
Patient was scheduled to come in for CPE 10/19/19, but was exposed to [place] by her son. States she will call back to reschedule CPE. 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

## 2019-10-17 NOTE — Telephone Encounter (Signed)
02/26/18 #30, 0  Please advise if refill is appropriate

## 2019-10-17 NOTE — Telephone Encounter (Signed)
Thanks, Ok to refill # 30 no refills.

## 2019-10-17 NOTE — Telephone Encounter (Signed)
Script sent to pharmacy.

## 2019-10-19 ENCOUNTER — Encounter: Payer: PPO | Admitting: Family Medicine

## 2019-12-08 NOTE — Progress Notes (Signed)
Office Visit Note  Patient: Laura Chambers             Date of Birth: Oct 09, 1940           MRN: [id number]             PCP: Leamon Arnt, MD Referring: Leamon Arnt, MD Visit Date: 12/21/2019 Occupation: @GUAROCC @  Subjective:  Medication management.   History of Present Illness: Laura Chambers is a 79 y.o. female tree of rheumatoid arthritis and osteoarthritis.  She states she has difficulty climbing stairs due to knee joint issues.  She has intermittent swelling in her right ankle joint.  She still have significant pedal edema.  She has difficult wearing compression socks.  She has some some discomfort in her hands off and on but no swelling.  Activities of Daily Living:  Patient reports morning stiffness for  15 minutes.   Patient Denies nocturnal pain.  Difficulty dressing/grooming: Denies Difficulty climbing stairs: Reports Difficulty getting out of chair: Denies Difficulty using hands for taps, buttons, cutlery, and/or writing: Reports  Review of Systems  Constitutional: Positive for fatigue. Negative for night sweats, weight gain and weight loss.  HENT: Negative for mouth sores, trouble swallowing, trouble swallowing, mouth dryness and nose dryness.   Eyes: Positive for dryness. Negative for pain, redness, itching and visual disturbance.  Respiratory: Negative for cough, shortness of breath and difficulty breathing.   Cardiovascular: Positive for swelling in legs/feet. Negative for chest pain, palpitations, hypertension and irregular heartbeat.  Gastrointestinal: Positive for diarrhea. Negative for blood in stool and constipation.  Endocrine: Negative for increased urination.  Genitourinary: Negative for difficulty urinating, painful urination and vaginal dryness.  Musculoskeletal: Positive for arthralgias, joint pain and morning stiffness. Negative for joint swelling, myalgias, muscle weakness, muscle tenderness and myalgias.  Skin: Negative for color change,  rash, hair loss, redness, skin tightness, ulcers and sensitivity to sunlight.  Allergic/Immunologic: Negative for susceptible to infections.  Neurological: Negative for dizziness, numbness, headaches, memory loss, night sweats and weakness.  Hematological: Negative for bruising/bleeding tendency and swollen glands.  Psychiatric/Behavioral: Negative for depressed mood, confusion and sleep disturbance. The patient is not nervous/anxious.     PMFS History:  Patient Active Problem List   Diagnosis Date Noted  . Rheumatoid arthritis involving multiple sites with positive rheumatoid factor ([place]) 10/13/2018  . Hx of colonic polyps 12/15/2017  . 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 Fibromyalgia 12/15/2017  . History of total right hip replacement 12/15/2017  . DDD (degenerative disc disease), lumbar 12/15/2017  . 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 Left foot drop 12/15/2017  . 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 Status post revision of total knee, left 06/09/2017  . Obesity, Class III, BMI 40-49.9 (morbid obesity) ([place]) 12/28/2016  . Essential hypertension 12/08/2016  . Peripheral neuropathy 01/12/2014  . 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 Dyslipidemia 06/16/2012    Past Medical History:  Diagnosis Date  . Anemia   . Anxiety   . Arthritis   . Atelectasis, left    left lower lung  . Blood transfusion without reported diagnosis   . Cataract cortical, senile, bilateral 03/19/2018  . Complication of anesthesia 2012   "irritated trachea" with cough x 1 year from intubation 2012.  smaller tube used with other surgeries  . Depression    just lost her son a month ago  .  Esophageal hernia   . Fibromyalgia   . Headache    occasionally  . HTN (hypertension)   . Hx of colonic polyps   . Hydronephrosis of right kidney   .  Hypercholesterolemia   . Hypertensive retinopathy of both eyes 03/19/2018  . Neuropathy due to medical condition ([place])    bilateral lower legs  . Obesity   . 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 Osteoporosis   . Renal angiomyolipoma   . Rheumatoid arthritis ([place])     Family History  Problem Relation Age of Onset  . Diabetes Father   . Osteoarthritis Father   . Heart disease Father   . Stroke Father   . Osteoarthritis Mother   . Stroke Mother   . Pneumonia Mother   . Colon cancer Sister 39  . Colon polyps Other        neice/nephew  . Diabetes Sister   . Diabetes Brother    Past Surgical History:  Procedure Laterality Date  . ABDOMINAL HYSTERECTOMY  1969  . BACK SURGERY  2012   rod in back-  lumbar disc removal with bone graft  . blephoroplasty     bilateral  . BREAST BIOPSY  1989  . CHOLECYSTECTOMY  2010  . EXCISIONAL TOTAL KNEE ARTHROPLASTY WITH ANTIBIOTIC SPACERS Left 11/14/2016   Procedure: EXCISIONAL LEFT TOTAL KNEE ARTHROPLASTY WITH  PLACEMENT OF ANTIBIOTIC SPACERS;  Surgeon: [name], MD;  Location: [facility];  Service: Orthopedics;  Laterality: Left;  . JOINT REPLACEMENT    . NISSEN FUNDOPLICATION    . PARAESOPHAGEAL HERNIA REPAIR  1989  . REVISION TOTAL KNEE ARTHROPLASTY Left 06/09/2017  . TONSILLECTOMY  1946  . TOTAL HIP ARTHROPLASTY Right 08/31/2012   Procedure: TOTAL HIP ARTHROPLASTY ANTERIOR APPROACH;  Surgeon: Mauri Pole, MD;  Location: [facility];  Service: Orthopedics;  Laterality: Right;  . TOTAL KNEE ARTHROPLASTY  2001   left  . TOTAL KNEE ARTHROPLASTY  2010   right  . TOTAL KNEE REVISION Left 06/09/2017   Procedure: LEFT REVISION KNEE ARTHROPLASTY;  Surgeon: [name], MD;  Location: [facility];  Service: Orthopedics;  Laterality: Left;   Social History   Social History Narrative   Patient lives at home alone.   Caffeine Use: 1 cup daily   Immunization History  Administered Date(s) Administered  .  Fluad Quad(high Dose 65+) 04/26/2019  . Influenza, High Dose Seasonal PF 03/27/2018  . Influenza,inj,Quad PF,6+ Mos 03/13/2017  . Influenza-Unspecified 03/26/2018  . PFIZER SARS-COV-2 Vaccination 09/02/2019, 09/30/2019  . Pneumococcal Conjugate-13 02/26/2018  . Pneumococcal Polysaccharide-23 05/17/2019     Objective: Vital Signs: BP 130/68 (BP Location: Left Arm, Patient Position: Sitting, Cuff Size: Normal)   Pulse 68   Resp 16   Ht 5\' 7"  (1.702 m)   Wt 211 lb 12.8 oz (96.1 kg)   BMI 33.17 kg/m    Physical Exam Vitals and nursing note reviewed.  Constitutional:      Appearance: She is well-developed.  HENT:     Head: Normocephalic and atraumatic.  Eyes:     Conjunctiva/sclera: Conjunctivae normal.  Cardiovascular:     Rate and Rhythm: Normal rate and regular rhythm.     Heart sounds: Normal heart sounds.  Pulmonary:     Effort: Pulmonary effort is normal.     Breath sounds: Normal breath sounds.  Abdominal:     General: Bowel sounds are normal.     Palpations: Abdomen is soft.  Musculoskeletal:  Cervical back: Normal range of motion.     Right lower leg: Edema present.     Left lower leg: Edema present.  Lymphadenopathy:     Cervical: No cervical adenopathy.  Skin:    General: Skin is warm and dry.     Capillary Refill: Capillary refill takes less than 2 seconds.  Neurological:     Mental Status: She is alert and oriented to person, place, and time.  Psychiatric:        Behavior: Behavior normal.      Musculoskeletal Exam: She has good range of motion for cervical spine, shoulder joints, elbow joints.  She has limited range of motion of bilateral wrist joints.  She synovial thickening over MCP joints with no synovitis.  DIP and PIP thickening was noted with no synovitis.  Her bilateral knee joints are replaced and warm to touch.  She has bilateral pedal edema.  She has thickening of her bilateral ankle joints.  DIP and PIP prominence was noted.  CDAI  Exam: CDAI Score: 3  Patient Global: 5 mm; Provider Global: 5 mm Swollen: 1 ; Tender: 2  Joint Exam 12/21/2019      Right  Left  Knee   Tender   Tender  Ankle  Swollen         Investigation: No additional findings.  Imaging: XR Foot 2 Views Left  Result Date: 12/21/2019 Juxta-articular osteopenia was noted.  First MTP, PIP and DIP narrowing was noted.  Intertarsal and metatarsal tarsal joint space narrowing was noted.  No erosive changes were noted.  Dorsal spur was noted. 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  XR Foot 2 Views Right  Result Date: 12/21/2019 PIP and DIP narrowing was noted.  First MTP narrowing was noted.  No erosive changes were noted.  Juxta-articular osteopenia was noted.  Intertarsal joint space narrowing was noted.  Subtalar joint space narrowing was noted.  Inferior calcaneal spur was noted. 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  XR Hand 2 View Left  Result Date: 12/21/2019 Juxta-articular osteopenia was noted.  Narrowing of all MCP joints, PIP and DIP joints was noted.  Subluxation of fourth PIP joint was noted.  Severe intercarpal and radiocarpal joint space narrowing was noted.  No erosive changes were noted. 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  XR Hand 2 View Right  Result Date: 12/21/2019 Juxta-articular osteopenia was noted.  Narrowing of MCP joints, PIP and DIP joints was noted.  Severe metacarpocarpal intercarpal and radiocarpal joint space narrowing was noted.  Possible erosive change was noted in the carpal bones.  No comparison films were available today. 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   Recent Labs: Lab Results  Component Value Date   WBC 8.7 05/27/2019   HGB 11.9 05/27/2019   PLT 288 05/27/2019   NA 142  05/27/2019   K 4.5 05/27/2019   CL 105 05/27/2019   CO2 25 05/27/2019   GLUCOSE 86 05/27/2019   BUN 24 05/27/2019   CREATININE 1.50 (H) 05/27/2019   BILITOT 0.6 05/27/2019   ALKPHOS 93 02/26/2018   AST 17 05/27/2019   ALT 9 05/27/2019   PROT 6.8 05/27/2019   ALBUMIN 4.0 02/26/2018   CALCIUM 9.3 05/27/2019   GFRAA 38 (L) 05/27/2019    Speciality Comments: PLQ Eye Exam: 12/13/2019 WNL @ [facility]    Procedures:  No procedures performed Allergies:  Ativan [lorazepam], Haldol [haloperidol], Epinephrine, Levofloxacin, Lipitor [atorvastatin], and Trovan [alatrofloxacin]   Assessment / Plan:     Visit Diagnoses: Rheumatoid arthritis involving multiple sites with positive rheumatoid factor ([place]) -patient has severe rheumatoid arthritis.  We obtained x-rays today which showed rheumatoid arthritis and osteoarthritis overlap.  She has severe intercarpal radiocarpal joint space narrowing and subtalar joint narrowing.  I discussed the x-ray findings with her.  She is not interested in more aggressive therapy.  She states her symptoms are very manageable with the current treatment.  She denies any discomfort in her hands and her feet.  She states she has discomfort mostly in her right ankle and bilateral knee joints which are replaced.  She will continue Plaquenil at this point.  Plan: XR Hand 2 View Right, XR Hand 2 View Left, XR Foot 2 Views Right, XR Foot 2 Views Left  High risk medication use - PLQ 200 mg 1 tablet by mouth daily (Low GFR)-initially experienced GI SE which have resolved.  PLQ Eye Exam: 03/19/18  - Plan: CBC with Differential/Platelet, COMPLETE METABOLIC PANEL WITH GFR today and then every 5 months.  History of total right hip replacement-doing well.  S/P total knee replacement, right-chronic pain.  Status post revision of total knee, left-she has chronic pain.  Infected prosthetic knee joint, sequela  DDD (degenerative disc disease), lumbar-chronic discomfort.  Left foot  drop  Chronic kidney disease (CKD), active medical management without dialysis, stage 3 (moderate)  Renal angiomyolipoma  Essential hypertension  Dyslipidemia  Fibromyalgia  Hx of colonic polyps  Hx of peripheral neuropathy  History of anxiety  History of depression  Osteoporosis screening-patient states she had DEXA scan several years ago.  I offered to scheduling DEXA but she would like to wait at this time.  She states she will discuss with her PCP.  Orders: Orders Placed This Encounter  Procedures  . XR Hand 2 View Right  . XR Hand 2 View Left  . XR Foot 2 Views Right  . XR Foot 2 Views Left  . CBC with Differential/Platelet  . COMPLETE METABOLIC PANEL WITH GFR   No orders of the defined types were placed in this encounter.  .  Follow-Up Instructions: Return in about 5 months (around 05/22/2020) for Rheumatoid arthritis, Osteoarthritis.   Bo Merino, MD  Note - This record has been created using Editor, commissioning.  Chart creation errors have been sought, but may not always  have been located. Such creation errors do not reflect on  the standard of medical care.

## 2019-12-13 ENCOUNTER — Encounter: Payer: Self-pay | Admitting: Family Medicine

## 2019-12-13 DIAGNOSIS — Z79899 Other long term (current) drug therapy: Secondary | ICD-10-CM | POA: Diagnosis not present

## 2019-12-13 DIAGNOSIS — M069 Rheumatoid arthritis, unspecified: Secondary | ICD-10-CM | POA: Diagnosis not present

## 2019-12-13 DIAGNOSIS — H2513 Age-related nuclear cataract, bilateral: Secondary | ICD-10-CM | POA: Diagnosis not present

## 2019-12-13 DIAGNOSIS — H25013 Cortical age-related cataract, bilateral: Secondary | ICD-10-CM | POA: Diagnosis not present

## 2019-12-13 LAB — HM DIABETES EYE EXAM

## 2019-12-14 ENCOUNTER — Encounter: Payer: Self-pay | Admitting: Family Medicine

## 2019-12-21 ENCOUNTER — Ambulatory Visit: Payer: Self-pay

## 2019-12-21 ENCOUNTER — Ambulatory Visit: Payer: PPO | Admitting: Rheumatology

## 2019-12-21 ENCOUNTER — Other Ambulatory Visit: Payer: Self-pay

## 2019-12-21 ENCOUNTER — Encounter: Payer: Self-pay | Admitting: Rheumatology

## 2019-12-21 VITALS — BP 130/68 | HR 68 | Resp 16 | Ht 67.0 in | Wt 211.8 lb

## 2019-12-21 DIAGNOSIS — Z96651 Presence of right artificial knee joint: Secondary | ICD-10-CM | POA: Diagnosis not present

## 2019-12-21 DIAGNOSIS — T8459XS Infection and inflammatory reaction due to other internal joint prosthesis, sequela: Secondary | ICD-10-CM

## 2019-12-21 DIAGNOSIS — E785 Hyperlipidemia, unspecified: Secondary | ICD-10-CM | POA: Diagnosis not present

## 2019-12-21 DIAGNOSIS — M5136 Other intervertebral disc degeneration, lumbar region: Secondary | ICD-10-CM

## 2019-12-21 DIAGNOSIS — M0579 Rheumatoid arthritis with rheumatoid factor of multiple sites without organ or systems involvement: Secondary | ICD-10-CM | POA: Diagnosis not present

## 2019-12-21 DIAGNOSIS — Z96641 Presence of right artificial hip joint: Secondary | ICD-10-CM

## 2019-12-21 DIAGNOSIS — Z860100 Personal history of colon polyps, unspecified: Secondary | ICD-10-CM

## 2019-12-21 DIAGNOSIS — D1771 Benign lipomatous neoplasm of kidney: Secondary | ICD-10-CM | POA: Diagnosis not present

## 2019-12-21 DIAGNOSIS — I10 Essential (primary) hypertension: Secondary | ICD-10-CM | POA: Diagnosis not present

## 2019-12-21 DIAGNOSIS — Z79899 Other long term (current) drug therapy: Secondary | ICD-10-CM | POA: Diagnosis not present

## 2019-12-21 DIAGNOSIS — Z96652 Presence of left artificial knee joint: Secondary | ICD-10-CM

## 2019-12-21 DIAGNOSIS — M797 Fibromyalgia: Secondary | ICD-10-CM

## 2019-12-21 DIAGNOSIS — M21372 Foot drop, left foot: Secondary | ICD-10-CM

## 2019-12-21 DIAGNOSIS — Z96659 Presence of unspecified artificial knee joint: Secondary | ICD-10-CM

## 2019-12-21 DIAGNOSIS — M51369 Other intervertebral disc degeneration, lumbar region without mention of lumbar back pain or lower extremity pain: Secondary | ICD-10-CM

## 2019-12-21 DIAGNOSIS — N183 Chronic kidney disease, stage 3 unspecified: Secondary | ICD-10-CM | POA: Diagnosis not present

## 2019-12-21 DIAGNOSIS — Z13820 Encounter for screening for osteoporosis: Secondary | ICD-10-CM

## 2019-12-21 DIAGNOSIS — Z8669 Personal history of other diseases of the nervous system and sense organs: Secondary | ICD-10-CM

## 2019-12-21 DIAGNOSIS — Z8659 Personal history of other mental and behavioral disorders: Secondary | ICD-10-CM

## 2019-12-21 NOTE — Patient Instructions (Signed)
Standing Labs We placed an order today for your standing lab work.   Please have your standing labs drawn in November  If possible, please have your labs drawn 2 weeks prior to your appointment so that the provider can discuss your results at your appointment.  We have open lab daily Monday through Thursday from 8:30-12:30 PM and 1:30-4:30 PM and Friday from 8:30-12:30 PM and 1:30-4:00 PM at the office of Dr. Bo Merino, [facility].   You may experience shorter wait times on Monday and Friday afternoons. The office is located at [address] [place], [place], [place] [address] No appointment is necessary.   Labs are drawn by [facility].  You may receive a bill from [facility] for your lab work.  If you wish to have your labs drawn at another location, please call the office 24 hours in advance to send orders.  If you have any questions regarding directions or hours of operation,  please call [phone].   As a reminder, please drink plenty of water prior to coming for your lab work. Thanks!

## 2019-12-22 LAB — COMPLETE METABOLIC PANEL WITH GFR
AG Ratio: 1.4 (calc) (ref 1.0–2.5)
ALT: 12 U/L (ref 6–29)
AST: 19 U/L (ref 10–35)
Albumin: 4 g/dL (ref 3.6–5.1)
Alkaline phosphatase (APISO): 83 U/L (ref 37–153)
BUN/Creatinine Ratio: 13 (calc) (ref 6–22)
BUN: 17 mg/dL (ref 7–25)
CO2: 27 mmol/L (ref 20–32)
Calcium: 9.2 mg/dL (ref 8.6–10.4)
Chloride: 107 mmol/L (ref 98–110)
Creat: 1.34 mg/dL — ABNORMAL HIGH (ref 0.60–0.93)
GFR, Est African American: 44 mL/min/{1.73_m2} — ABNORMAL LOW (ref >=60)
GFR, Est Non African American: 38 mL/min/{1.73_m2} — ABNORMAL LOW (ref >=60)
Globulin: 2.8 g/dL (calc) (ref 1.9–3.7)
Glucose, Bld: 91 mg/dL (ref 65–99)
Potassium: 4.7 mmol/L (ref 3.5–5.3)
Sodium: 144 mmol/L (ref 135–146)
Total Bilirubin: 0.8 mg/dL (ref 0.2–1.2)
Total Protein: 6.8 g/dL (ref 6.1–8.1)

## 2019-12-22 LAB — CBC WITH DIFFERENTIAL/PLATELET
Absolute Monocytes: 721 cells/uL (ref 200–950)
Basophils Absolute: 57 cells/uL (ref 0–200)
Basophils Relative: 0.7 %
Eosinophils Absolute: 332 cells/uL (ref 15–500)
Eosinophils Relative: 4.1 %
HCT: 41.5 % (ref 35.0–45.0)
Hemoglobin: 13.4 g/dL (ref 11.7–15.5)
Lymphs Abs: 1742 cells/uL (ref 850–3900)
MCH: 28.9 pg (ref 27.0–33.0)
MCHC: 32.3 g/dL (ref 32.0–36.0)
MCV: 89.6 fL (ref 80.0–100.0)
MPV: 12.4 fL (ref 7.5–12.5)
Monocytes Relative: 8.9 %
Neutro Abs: 5249 cells/uL (ref 1500–7800)
Neutrophils Relative %: 64.8 %
Platelets: 222 10*3/uL (ref 140–400)
RBC: 4.63 10*6/uL (ref 3.80–5.10)
RDW: 12.5 % (ref 11.0–15.0)
Total Lymphocyte: 21.5 %
WBC: 8.1 10*3/uL (ref 3.8–10.8)

## 2019-12-22 NOTE — Progress Notes (Signed)
CBC is normal, GFR is low but is stable.  Patient is on Lasix.

## 2020-01-03 ENCOUNTER — Other Ambulatory Visit: Payer: Self-pay

## 2020-01-03 ENCOUNTER — Emergency Department (HOSPITAL_COMMUNITY): Payer: PPO

## 2020-01-03 ENCOUNTER — Inpatient Hospital Stay (HOSPITAL_COMMUNITY)
Admission: EM | Admit: 2020-01-03 | Discharge: 2020-01-11 | DRG: 480 | Disposition: A | Discharge status: Skilled Nursing Facility | Payer: PPO | Attending: Internal Medicine | Admitting: Internal Medicine

## 2020-01-03 ENCOUNTER — Encounter (HOSPITAL_COMMUNITY): Payer: Self-pay

## 2020-01-03 DIAGNOSIS — M0570 Rheumatoid arthritis with rheumatoid factor of unspecified site without organ or systems involvement: Secondary | ICD-10-CM | POA: Diagnosis not present

## 2020-01-03 DIAGNOSIS — E785 Hyperlipidemia, unspecified: Secondary | ICD-10-CM | POA: Diagnosis not present

## 2020-01-03 DIAGNOSIS — M797 Fibromyalgia: Secondary | ICD-10-CM | POA: Diagnosis not present

## 2020-01-03 DIAGNOSIS — D1771 Benign lipomatous neoplasm of kidney: Secondary | ICD-10-CM | POA: Diagnosis not present

## 2020-01-03 DIAGNOSIS — S7291XA Unspecified fracture of right femur, initial encounter for closed fracture: Secondary | ICD-10-CM | POA: Diagnosis present

## 2020-01-03 DIAGNOSIS — Z823 Family history of stroke: Secondary | ICD-10-CM

## 2020-01-03 DIAGNOSIS — I119 Hypertensive heart disease without heart failure: Secondary | ICD-10-CM | POA: Diagnosis not present

## 2020-01-03 DIAGNOSIS — S72451A Displaced supracondylar fracture without intracondylar extension of lower end of right femur, initial encounter for closed fracture: Secondary | ICD-10-CM | POA: Diagnosis not present

## 2020-01-03 DIAGNOSIS — W010XXA Fall on same level from slipping, tripping and stumbling without subsequent striking against object, initial encounter: Secondary | ICD-10-CM | POA: Diagnosis present

## 2020-01-03 DIAGNOSIS — M9711XD 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, subsequent encounter: Secondary | ICD-10-CM | POA: Diagnosis not present

## 2020-01-03 DIAGNOSIS — Z96651 Presence of right artificial knee joint: Secondary | ICD-10-CM | POA: Diagnosis not present

## 2020-01-03 DIAGNOSIS — Z888 Allergy status to other drugs, medicaments and biological substances status: Secondary | ICD-10-CM

## 2020-01-03 DIAGNOSIS — I10 Essential (primary) hypertension: Secondary | ICD-10-CM | POA: Diagnosis present

## 2020-01-03 DIAGNOSIS — K5900 Constipation, unspecified: Secondary | ICD-10-CM | POA: Diagnosis not present

## 2020-01-03 DIAGNOSIS — E875 Hyperkalemia: Secondary | ICD-10-CM | POA: Diagnosis present

## 2020-01-03 DIAGNOSIS — Z800 Family history of malignant neoplasm of digestive organs: Secondary | ICD-10-CM

## 2020-01-03 DIAGNOSIS — H25013 Cortical age-related cataract, bilateral: Secondary | ICD-10-CM | POA: Diagnosis present

## 2020-01-03 DIAGNOSIS — D72823 Leukemoid reaction: Secondary | ICD-10-CM | POA: Diagnosis present

## 2020-01-03 DIAGNOSIS — Z96653 Presence of artificial knee joint, bilateral: Secondary | ICD-10-CM | POA: Diagnosis not present

## 2020-01-03 DIAGNOSIS — J9601 Acute respiratory failure with hypoxia: Secondary | ICD-10-CM | POA: Diagnosis not present

## 2020-01-03 DIAGNOSIS — R339 Retention of urine, unspecified: Secondary | ICD-10-CM | POA: Diagnosis not present

## 2020-01-03 DIAGNOSIS — I129 Hypertensive chronic kidney disease with stage 1 through stage 4 chronic kidney disease, or unspecified chronic kidney disease: Secondary | ICD-10-CM | POA: Diagnosis not present

## 2020-01-03 DIAGNOSIS — M25561 Pain in right knee: Secondary | ICD-10-CM | POA: Diagnosis not present

## 2020-01-03 DIAGNOSIS — Z03818 Encounter for observation for suspected exposure to other biological agents ruled out: Secondary | ICD-10-CM | POA: Diagnosis not present

## 2020-01-03 DIAGNOSIS — F329 Major depressive disorder, single episode, unspecified: Secondary | ICD-10-CM | POA: Diagnosis present

## 2020-01-03 DIAGNOSIS — Z01818 Encounter for other preprocedural examination: Secondary | ICD-10-CM | POA: Diagnosis not present

## 2020-01-03 DIAGNOSIS — T148XXA Other injury of unspecified body region, initial encounter: Secondary | ICD-10-CM

## 2020-01-03 DIAGNOSIS — F419 Anxiety disorder, unspecified: Secondary | ICD-10-CM | POA: Diagnosis present

## 2020-01-03 DIAGNOSIS — M6281 Muscle weakness (generalized): Secondary | ICD-10-CM | POA: Diagnosis not present

## 2020-01-03 DIAGNOSIS — S72451D Displaced supracondylar fracture without intracondylar extension of lower end of right femur, subsequent encounter for closed fracture with routine healing: Secondary | ICD-10-CM | POA: Diagnosis not present

## 2020-01-03 DIAGNOSIS — M80851A Other osteoporosis with current pathological fracture, right femur, initial encounter for fracture: Secondary | ICD-10-CM | POA: Diagnosis not present

## 2020-01-03 DIAGNOSIS — G9009 Other idiopathic peripheral autonomic neuropathy: Secondary | ICD-10-CM | POA: Diagnosis not present

## 2020-01-03 DIAGNOSIS — Z79899 Other long term (current) drug therapy: Secondary | ICD-10-CM

## 2020-01-03 DIAGNOSIS — Z8781 Personal history of (healed) traumatic fracture: Secondary | ICD-10-CM | POA: Diagnosis present

## 2020-01-03 DIAGNOSIS — Z8249 Family history of ischemic heart disease and other diseases of the circulatory system: Secondary | ICD-10-CM | POA: Diagnosis not present

## 2020-01-03 DIAGNOSIS — Z6831 Body mass index (BMI) 31.0-31.9, adult: Secondary | ICD-10-CM

## 2020-01-03 DIAGNOSIS — S72401A Unspecified fracture of lower end of right femur, initial encounter for closed fracture: Secondary | ICD-10-CM | POA: Diagnosis not present

## 2020-01-03 DIAGNOSIS — D72829 Elevated white blood cell count, unspecified: Secondary | ICD-10-CM | POA: Diagnosis present

## 2020-01-03 DIAGNOSIS — Z741 Need for assistance with personal care: Secondary | ICD-10-CM | POA: Diagnosis not present

## 2020-01-03 DIAGNOSIS — D62 Acute posthemorrhagic anemia: Secondary | ICD-10-CM | POA: Diagnosis not present

## 2020-01-03 DIAGNOSIS — Z833 Family history of diabetes mellitus: Secondary | ICD-10-CM

## 2020-01-03 DIAGNOSIS — H35033 Hypertensive retinopathy, bilateral: Secondary | ICD-10-CM | POA: Diagnosis present

## 2020-01-03 DIAGNOSIS — R519 Headache, unspecified: Secondary | ICD-10-CM | POA: Diagnosis present

## 2020-01-03 DIAGNOSIS — F411 Generalized anxiety disorder: Secondary | ICD-10-CM | POA: Diagnosis not present

## 2020-01-03 DIAGNOSIS — S8991XA Unspecified injury of right lower leg, initial encounter: Secondary | ICD-10-CM | POA: Diagnosis not present

## 2020-01-03 DIAGNOSIS — I1310 Hypertensive heart and chronic kidney disease without heart failure, with stage 1 through stage 4 chronic kidney disease, or unspecified chronic kidney disease: Secondary | ICD-10-CM | POA: Diagnosis not present

## 2020-01-03 DIAGNOSIS — E669 Obesity, unspecified: Secondary | ICD-10-CM | POA: Diagnosis present

## 2020-01-03 DIAGNOSIS — G629 Polyneuropathy, unspecified: Secondary | ICD-10-CM | POA: Diagnosis not present

## 2020-01-03 DIAGNOSIS — I959 Hypotension, unspecified: Secondary | ICD-10-CM | POA: Diagnosis not present

## 2020-01-03 DIAGNOSIS — G6289 Other specified polyneuropathies: Secondary | ICD-10-CM | POA: Diagnosis not present

## 2020-01-03 DIAGNOSIS — Z9889 Other specified postprocedural states: Secondary | ICD-10-CM | POA: Diagnosis not present

## 2020-01-03 DIAGNOSIS — S79911A Unspecified injury of right hip, initial encounter: Secondary | ICD-10-CM | POA: Diagnosis not present

## 2020-01-03 DIAGNOSIS — D649 Anemia, unspecified: Secondary | ICD-10-CM | POA: Diagnosis not present

## 2020-01-03 DIAGNOSIS — K5909 Other constipation: Secondary | ICD-10-CM | POA: Diagnosis not present

## 2020-01-03 DIAGNOSIS — Z4789 Encounter for other orthopedic aftercare: Secondary | ICD-10-CM | POA: Diagnosis not present

## 2020-01-03 DIAGNOSIS — M21372 Foot drop, left foot: Secondary | ICD-10-CM | POA: Diagnosis not present

## 2020-01-03 DIAGNOSIS — M0589 Other rheumatoid arthritis with rheumatoid factor of multiple sites: Secondary | ICD-10-CM | POA: Diagnosis not present

## 2020-01-03 DIAGNOSIS — M0579 Rheumatoid arthritis with rheumatoid factor of multiple sites without organ or systems involvement: Secondary | ICD-10-CM | POA: Diagnosis present

## 2020-01-03 DIAGNOSIS — E861 Hypovolemia: Secondary | ICD-10-CM | POA: Diagnosis present

## 2020-01-03 DIAGNOSIS — Z8719 Personal history of other diseases of the digestive system: Secondary | ICD-10-CM

## 2020-01-03 DIAGNOSIS — Z8371 Family history of colonic polyps: Secondary | ICD-10-CM

## 2020-01-03 DIAGNOSIS — S7290XA Unspecified fracture of unspecified femur, initial encounter for closed fracture: Secondary | ICD-10-CM

## 2020-01-03 DIAGNOSIS — N1832 Chronic kidney disease, stage 3b: Secondary | ICD-10-CM | POA: Diagnosis not present

## 2020-01-03 DIAGNOSIS — M9701XD Periprosthetic fracture around internal prosthetic right hip joint, subsequent encounter: Secondary | ICD-10-CM | POA: Diagnosis not present

## 2020-01-03 DIAGNOSIS — M159 Polyosteoarthritis, unspecified: Secondary | ICD-10-CM | POA: Diagnosis not present

## 2020-01-03 DIAGNOSIS — Z96641 Presence of right artificial hip joint: Secondary | ICD-10-CM | POA: Diagnosis present

## 2020-01-03 DIAGNOSIS — Z20822 Contact with and (suspected) exposure to covid-19: Secondary | ICD-10-CM | POA: Diagnosis not present

## 2020-01-03 DIAGNOSIS — Z7401 Bed confinement status: Secondary | ICD-10-CM | POA: Diagnosis not present

## 2020-01-03 DIAGNOSIS — R262 Difficulty in walking, not elsewhere classified: Secondary | ICD-10-CM | POA: Diagnosis not present

## 2020-01-03 DIAGNOSIS — S72351D Displaced comminuted fracture of shaft of right femur, subsequent encounter for closed fracture with routine healing: Secondary | ICD-10-CM | POA: Diagnosis not present

## 2020-01-03 DIAGNOSIS — M9711XA 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, initial encounter: Secondary | ICD-10-CM | POA: Diagnosis not present

## 2020-01-03 DIAGNOSIS — N183 Chronic kidney disease, stage 3 unspecified: Secondary | ICD-10-CM | POA: Diagnosis not present

## 2020-01-03 DIAGNOSIS — N179 Acute kidney failure, unspecified: Secondary | ICD-10-CM | POA: Diagnosis not present

## 2020-01-03 DIAGNOSIS — R600 Localized edema: Secondary | ICD-10-CM | POA: Diagnosis not present

## 2020-01-03 DIAGNOSIS — M2550 Pain in unspecified joint: Secondary | ICD-10-CM | POA: Diagnosis not present

## 2020-01-03 DIAGNOSIS — N1330 Unspecified hydronephrosis: Secondary | ICD-10-CM | POA: Diagnosis not present

## 2020-01-03 LAB — BASIC METABOLIC PANEL
Anion gap: 10 (ref 5–15)
BUN: 34 mg/dL — ABNORMAL HIGH (ref 8–23)
CO2: 24 mmol/L (ref 22–32)
Calcium: 9.4 mg/dL (ref 8.9–10.3)
Chloride: 109 mmol/L (ref 98–111)
Creatinine, Ser: 1.75 mg/dL — ABNORMAL HIGH (ref 0.44–1.00)
GFR calc Af Amer: 32 mL/min — ABNORMAL LOW (ref >60)
GFR calc non Af Amer: 27 mL/min — ABNORMAL LOW (ref >60)
Glucose, Bld: 157 mg/dL — ABNORMAL HIGH (ref 70–99)
Potassium: 4.5 mmol/L (ref 3.5–5.1)
Sodium: 143 mmol/L (ref 135–145)

## 2020-01-03 LAB — CBC WITH DIFFERENTIAL/PLATELET
Abs Immature Granulocytes: 0.05 10*3/uL (ref 0.00–0.07)
Basophils Absolute: 0.1 10*3/uL (ref 0.0–0.1)
Basophils Relative: 0 %
Eosinophils Absolute: 0.1 10*3/uL (ref 0.0–0.5)
Eosinophils Relative: 0 %
HCT: 40.3 % (ref 36.0–46.0)
Hemoglobin: 12.6 g/dL (ref 12.0–15.0)
Immature Granulocytes: 0 %
Lymphocytes Relative: 7 %
Lymphs Abs: 1.2 10*3/uL (ref 0.7–4.0)
MCH: 29 pg (ref 26.0–34.0)
MCHC: 31.3 g/dL (ref 30.0–36.0)
MCV: 92.6 fL (ref 80.0–100.0)
Monocytes Absolute: 0.9 10*3/uL (ref 0.1–1.0)
Monocytes Relative: 5 %
Neutro Abs: 16.1 10*3/uL — ABNORMAL HIGH (ref 1.7–7.7)
Neutrophils Relative %: 88 %
Platelets: 240 10*3/uL (ref 150–400)
RBC: 4.35 MIL/uL (ref 3.87–5.11)
RDW: 13.2 % (ref 11.5–15.5)
WBC: 18.4 10*3/uL — ABNORMAL HIGH (ref 4.0–10.5)
nRBC: 0 % (ref 0.0–0.2)

## 2020-01-03 LAB — SARS CORONAVIRUS 2 BY RT PCR (HOSPITAL ORDER, PERFORMED IN ~~LOC~~ HOSPITAL LAB): SARS Coronavirus 2: NEGATIVE

## 2020-01-03 MED ORDER — ATENOLOL 50 MG PO TABS
50.0000 mg | ORAL_TABLET | Freq: Every day | ORAL | Status: DC
  Administered 2020-01-04 – 2020-01-10 (×8): 50 mg via ORAL
  Filled 2020-01-03 (×8): qty 1

## 2020-01-03 MED ORDER — GABAPENTIN 300 MG PO CAPS
300.0000 mg | ORAL_CAPSULE | Freq: Two times a day (BID) | ORAL | Status: DC
  Administered 2020-01-04 – 2020-01-11 (×16): 300 mg via ORAL
  Filled 2020-01-03 (×16): qty 1

## 2020-01-03 MED ORDER — AMLODIPINE BESYLATE 10 MG PO TABS
10.0000 mg | ORAL_TABLET | Freq: Every evening | ORAL | Status: DC
  Administered 2020-01-04: 10 mg via ORAL
  Filled 2020-01-03: qty 2

## 2020-01-03 MED ORDER — HYDROMORPHONE HCL 1 MG/ML IJ SOLN
1.0000 mg | Freq: Once | INTRAMUSCULAR | Status: AC
  Administered 2020-01-03: 1 mg via INTRAMUSCULAR
  Filled 2020-01-03: qty 1

## 2020-01-03 MED ORDER — ROSUVASTATIN CALCIUM 5 MG PO TABS
10.0000 mg | ORAL_TABLET | Freq: Every day | ORAL | Status: DC
  Administered 2020-01-04 – 2020-01-10 (×8): 10 mg via ORAL
  Filled 2020-01-03 (×5): qty 2
  Filled 2020-01-03: qty 1
  Filled 2020-01-03 (×2): qty 2

## 2020-01-03 MED ORDER — MORPHINE SULFATE (PF) 2 MG/ML IV SOLN: 0.5000 mg | INTRAVENOUS | Status: DC | PRN

## 2020-01-03 NOTE — ED Notes (Signed)
Pt given graham crackers, peanut butter, and water 

## 2020-01-03 NOTE — H&P (Addendum)
History and Physical    Laura Chambers HER:[id number] DOB: 1941-04-04 DOA: 01/03/2020  PCP: Leamon Arnt, MD  Patient coming from: Home.  Chief Complaint: Fall.  HPI: Laura Chambers is a 79 y.o. female with history of rheumatoid arthritis hypertension hyperlipidemia presents to the ER after patient had a fall.  Patient states he has left foot drop and recently was diagnosed with problems with the right foot arch by her rheumatologist walks with help of walker when she slipped and fell.  Laura Chambers head but did not lose consciousness.  Denies any chest pain or shortness of breath.  Since the fall patient has been having pain around the right knee and was brought to the ER.  ED Course: X-rays in the ER shows features concerning for right periprosthetic knee fracture.  Plan for orthopedic surgeon Dr. Erlinda Hong was consulted and patient will be admitted to [facility].  EKG shows normal sinus rhythm.  Chest x-ray unremarkable labs are significant for mild leukocytosis patient afebrile.  Covid test was negative.  Patient has chronic kidney disease creatinine is around 1.7.  Review of Systems: As per HPI, rest all negative.   Past Medical History:  Diagnosis Date  . Anemia   . Anxiety   . Arthritis   . Atelectasis, left    left lower lung  . Blood transfusion without reported diagnosis   . Cataract cortical, senile, bilateral 03/19/2018  . Complication of anesthesia 2012   "irritated trachea" with cough x 1 year from intubation 2012.  smaller tube used with other surgeries  . Depression    just lost her son a month ago  . Esophageal hernia   . Fibromyalgia   . Headache    occasionally  . HTN (hypertension)   . Hx of colonic polyps   . Hydronephrosis of right kidney   . Hypercholesterolemia   . Hypertensive retinopathy of both eyes 03/19/2018  . Neuropathy due to medical condition ([place])    bilateral lower legs  . Obesity   . Osteoarthritis (arthritis due to wear and tear  of joints)   . Osteoporosis   . Renal angiomyolipoma   . Rheumatoid arthritis [facility])     Past Surgical History:  Procedure Laterality Date  . ABDOMINAL HYSTERECTOMY  1969  . BACK SURGERY  2012   rod in back-  lumbar disc removal with bone graft  . blephoroplasty     bilateral  . BREAST BIOPSY  1989  . CHOLECYSTECTOMY  2010  . EXCISIONAL TOTAL KNEE ARTHROPLASTY WITH ANTIBIOTIC SPACERS Left 11/14/2016   Procedure: EXCISIONAL LEFT TOTAL KNEE ARTHROPLASTY WITH  PLACEMENT OF ANTIBIOTIC SPACERS;  Surgeon: [name], MD;  Location: [facility];  Service: Orthopedics;  Laterality: Left;  . JOINT REPLACEMENT    . NISSEN FUNDOPLICATION    . PARAESOPHAGEAL HERNIA REPAIR  1989  . REVISION TOTAL KNEE ARTHROPLASTY Left 06/09/2017  . TONSILLECTOMY  1946  . TOTAL HIP ARTHROPLASTY Right 08/31/2012   Procedure: TOTAL HIP ARTHROPLASTY ANTERIOR APPROACH;  Surgeon: Mauri Pole, MD;  Location: [facility];  Service: Orthopedics;  Laterality: Right;  . TOTAL KNEE ARTHROPLASTY  2001   left  . TOTAL KNEE ARTHROPLASTY  2010   right  . TOTAL KNEE REVISION Left 06/09/2017   Procedure: LEFT REVISION KNEE ARTHROPLASTY;  Surgeon: [name], MD;  Location: [place];  Service: Orthopedics;  Laterality: Left;     reports that she has never smoked. She has never used smokeless tobacco. She  reports that she does not drink alcohol and does not use drugs.  Allergies  Allergen Reactions  . Ativan [Lorazepam] Other (See Comments)    Highly irritable  . Haldol [Haloperidol] Other (See Comments)    Vegetative State  . Epinephrine Other (See Comments)    Reaction:  Increased pts BP and caused shoulder to jerk   . Levofloxacin Nausea And Vomiting  . Lipitor [Atorvastatin] Other (See Comments)    Reaction:  Joint pain and dizziness   . Trovan [Alatrofloxacin] Nausea And Vomiting    Pt states that this med caused pancreatitis.    Family History  Problem Relation Age of Onset  . Diabetes Father     . Osteoarthritis Father   . Heart disease Father   . Stroke Father   . Osteoarthritis Mother   . Stroke Mother   . Pneumonia Mother   . Colon cancer Sister 43  . Colon polyps Other        neice/nephew  . Diabetes Sister   . Diabetes Brother     Prior to Admission medications   Medication Sig Start Date End Date Taking? Authorizing Provider  amLODipine (NORVASC) 10 MG tablet TAKE 1 TABLET (10 MG TOTAL) DAILY BY MOUTH. Patient taking differently: Take 10 mg by mouth every evening. TAKE 1 TABLET (10 MG TOTAL) DAILY BY MOUTH. 08/04/19  Yes Leamon Arnt, MD  atenolol (TENORMIN) 50 MG tablet TAKE 1 TABLET BY MOUTH EVERY DAY 02/28/19  Yes Briscoe Deutscher, DO  Calcium Carbonate-Vitamin D (CALCIUM 600+D) 600-400 MG-UNIT tablet Take 1 tablet by mouth every other day.   Yes [provider]  ferrous sulfate 325 (65 FE) MG EC tablet Take 325 mg by mouth daily with breakfast.    Yes [provider]  FLUoxetine (PROZAC) 20 MG capsule TAKE 1 CAPSULE BY MOUTH EVERY DAY IN THE MORNING Patient taking differently: Take 20 mg by mouth daily.  08/04/19  Yes Leamon Arnt, MD  folic acid (FOLVITE) 326 MCG tablet Take 800 mcg daily by mouth.    Yes [provider]  furosemide (LASIX) 40 MG tablet Take 40 mg by mouth daily as needed for fluid or edema.   Yes [provider]  gabapentin (NEURONTIN) 300 MG capsule TAKE 1 CAPSULE BY MOUTH 2 TIMES DAILY 08/22/19  Yes Leamon Arnt, MD  Ginger, Zingiber officinalis, (GINGER ROOT PO) Take 1 capsule by mouth daily.    Yes [provider]  hydroxychloroquine (PLAQUENIL) 200 MG tablet TAKE 1 TABLET BY MOUTH EVERY DAY 10/04/19  Yes Deveshwar, Abel Presto, MD  Misc Natural Products (TART CHERRY ADVANCED PO) Take 1 tablet by mouth daily.    Yes [provider]  Multiple Vitamin (MULTIVITAMIN WITH MINERALS) TABS tablet Take 1 tablet by mouth daily.    Yes [provider]  Omega-3 1000 MG CAPS Take 1 capsule by  mouth daily.    Yes [provider]  promethazine (PHENERGAN) 25 MG tablet Take 1 tablet (25 mg total) by mouth every 8 (eight) hours as needed for nausea or vomiting. 10/17/19  Yes Leamon Arnt, MD  rosuvastatin (CRESTOR) 10 MG tablet TAKE 1 TABLET BY MOUTH EVERY DAY 09/19/19  Yes Leamon Arnt, MD  tamsulosin (FLOMAX) 0.4 MG CAPS capsule Take 0.4 mg by mouth daily as needed (urination).    Yes [provider]  Turmeric 400 MG CAPS Take 1 capsule by mouth daily.    Yes [provider]  furosemide (LASIX) 40 MG tablet  Take 1 tablet (40 mg total) daily as needed by mouth for edema. 05/18/17 12/21/19  Briscoe Deutscher, DO    Physical Exam: Constitutional: Moderately built and nourished. Vitals:   01/03/20 1831 01/03/20 1917 01/03/20 2045 01/03/20 2145  BP: (!) 153/70 (!) 151/72 138/69 (!) 137/59  Pulse: 77 75 75 80  Resp: 16 18 18 20   Temp:      TempSrc:      SpO2: 100% 99% 98% 99%   Eyes: Anicteric no pallor. ENMT: No discharge from the ears eyes nose or mouth. Neck: No mass felt.  No neck rigidity. Respiratory: No rhonchi or crepitations. Cardiovascular: S1-S2 heard. Abdomen: Soft nontender bowel sounds present. Musculoskeletal: Right knee is in a brace.  Bilateral lower extremity edema which patient states is chronic. Skin: No rash. Neurologic: Alert awake oriented to time place and person.  Moves all extremities. Psychiatric: Appears normal per normal affect.   Labs on Admission: I have personally reviewed following labs and imaging studies  CBC: Recent Labs  Lab 01/03/20 2056  WBC 18.4*  NEUTROABS 16.1*  HGB 12.6  HCT 40.3  MCV 92.6  PLT 417   Basic Metabolic Panel: Recent Labs  Lab 01/03/20 2056  NA 143  K 4.5  CL 109  CO2 24  GLUCOSE 157*  BUN 34*  CREATININE 1.75*  CALCIUM 9.4   GFR: Estimated Creatinine Clearance: 31 mL/min (A) (by C-G formula based on SCr of 1.75 mg/dL (H)). Liver Function Tests: No results for input(s):  AST, ALT, ALKPHOS, BILITOT, PROT, ALBUMIN in the last 168 hours. No results for input(s): LIPASE, AMYLASE in the last 168 hours. No results for input(s): AMMONIA in the last 168 hours. Coagulation Profile: No results for input(s): INR, PROTIME in the last 168 hours. Cardiac Enzymes: No results for input(s): CKTOTAL, CKMB, CKMBINDEX, TROPONINI in the last 168 hours. BNP (last 3 results) No results for input(s): PROBNP in the last 8760 hours. HbA1C: No results for input(s): HGBA1C in the last 72 hours. CBG: No results for input(s): GLUCAP in the last 168 hours. Lipid Profile: No results for input(s): CHOL, HDL, LDLCALC, TRIG, CHOLHDL, LDLDIRECT in the last 72 hours. Thyroid Function Tests: No results for input(s): TSH, T4TOTAL, FREET4, T3FREE, THYROIDAB in the last 72 hours. Anemia Panel: No results for input(s): VITAMINB12, FOLATE, FERRITIN, TIBC, IRON, RETICCTPCT in the last 72 hours. Urine analysis:    Component Value Date/Time   COLORURINE COLORLESS (A) 12/26/2016 1838   APPEARANCEUR CLEAR 12/26/2016 1838   LABSPEC 1.006 12/26/2016 1838   PHURINE 5.0 12/26/2016 1838   GLUCOSEU NEGATIVE 12/26/2016 1838   HGBUR SMALL (A) 12/26/2016 1838   BILIRUBINUR NEGATIVE 12/26/2016 1838   KETONESUR NEGATIVE 12/26/2016 1838   PROTEINUR 30 (A) 12/26/2016 1838   UROBILINOGEN 0.2 08/25/2012 1408   NITRITE NEGATIVE 12/26/2016 1838   LEUKOCYTESUR TRACE (A) 12/26/2016 1838   Sepsis Labs: @LABRCNTIP (procalcitonin:4,lacticidven:4) ) Recent Results (from the past 240 hour(s))  SARS Coronavirus 2 by RT PCR (hospital order, performed in [place] hospital lab) Nasopharyngeal Nasopharyngeal Swab     Status: None   Collection Time: 01/03/20  8:56 PM   Specimen: Nasopharyngeal Swab  Result Value Ref Range Status   SARS Coronavirus 2 NEGATIVE NEGATIVE Final    Comment: (NOTE) SARS-CoV-2 target nucleic acids are NOT DETECTED.  The SARS-CoV-2 RNA is generally detectable in upper and  lower respiratory specimens during the acute phase of infection. The lowest concentration of SARS-CoV-2 viral copies this assay can detect is 250 copies /  mL. A negative result does not preclude SARS-CoV-2 infection and should not be used as the sole basis for treatment or other patient management decisions.  A negative result may occur with improper specimen collection / handling, submission of specimen other than nasopharyngeal swab, presence of viral mutation(s) within the areas targeted by this assay, and inadequate number of viral copies (<250 copies / mL). A negative result must be combined with clinical observations, patient history, and epidemiological information.  Fact Sheet for Patients:   StrictlyIdeas.no  Fact Sheet for Healthcare Providers: BankingDealers.co.za  This test is not yet approved or  cleared by the [place] FDA and has been authorized for detection and/or diagnosis of SARS-CoV-2 by FDA under an Emergency Use Authorization (EUA).  This EUA will remain in effect (meaning this test can be used) for the duration of the COVID-19 declaration under Section 564(b)(1) of the Act, 21 U.S.C. section 360bbb-3(b)(1), unless the authorization is terminated or revoked sooner.  Performed at [facility], [place] [address] [place], [place] [address]      Radiological Exams on Admission: DG Chest 1 View  Result Date: 01/03/2020 CLINICAL DATA:  Preoperative assessment for right femur fracture, hypertension EXAM: CHEST  1 VIEW COMPARISON:  06/09/2017 FINDINGS: Single frontal view of the chest demonstrates a stable cardiac silhouette. Mild atherosclerosis of the aortic arch. No airspace disease, effusion, or pneumothorax. Surgical clips at the gastroesophageal junction. No acute bony abnormalities. IMPRESSION: 1. No acute intrathoracic process. Electronically Signed   By: Randa Ngo M.D.   On: 01/03/2020  20:58   DG Tibia/Fibula Right  Result Date: 01/03/2020 CLINICAL DATA:  Right knee pain after fall EXAM: RIGHT TIBIA AND FIBULA - 2 VIEW COMPARISON:  07/29/2017 FINDINGS: Frontal and lateral views of the right tibia and fibula are obtained. Portions of the right knee arthroplasty are excluded by collimation but are fully evaluated on the corresponding right knee evaluation. I do not see any tibial or fibular fractures. There is diffuse subcutaneous edema. IMPRESSION: 1. No acute fracture of the right tibia or fibula. Electronically Signed   By: Randa Ngo M.D.   On: 01/03/2020 20:14   DG Knee Complete 4 Views Right  Result Date: 01/03/2020 CLINICAL DATA:  [place] today, right knee pain EXAM: RIGHT KNEE - COMPLETE 4+ VIEW COMPARISON:  07/29/2017 FINDINGS: Frontal, bilateral oblique, lateral views of the right knee are obtained. There is a comminuted displaced periprosthetic fracture of the distal right femur. There is approximately 2.4 cm of dorsal displacement of the distal fracture fragment. The femoral and tibial components of the arthroplasty remain well aligned. No other bony abnormalities. There is diffuse soft tissue swelling. IMPRESSION: 1. Comminuted and displaced periprosthetic distal right femur fracture. Electronically Signed   By: Randa Ngo M.D.   On: 01/03/2020 20:13   DG Hip Unilat W or Wo Pelvis 2-3 Views Right  Result Date: 01/03/2020 CLINICAL DATA:  [place], right knee pain EXAM: DG HIP (WITH OR WITHOUT PELVIS) 2-3V RIGHT COMPARISON:  07/29/2017 FINDINGS: Frontal view of the pelvis as well as frontal and cross-table lateral views of the right hip are obtained. Portions of the distal margin of the femoral prosthesis are excluded on the lateral view by collimation. The right hip appears in normal anatomic alignment without change since prior study. No acute pelvic fractures. Mild left hip osteoarthritis. Postsurgical changes are seen at L4/L5. IMPRESSION: 1. No acute pelvic or right hip  fracture. Electronically Signed   By: Diana Eves.D.  On: 01/03/2020 20:14    EKG: Independently reviewed.  Normal sinus rhythm.  Assessment/Plan Principal Problem:   Closed femur fracture (HCC) Active Problems:   Dyslipidemia   Essential hypertension   Obesity, Class III, BMI 40-49.9 (morbid obesity) (HCC)   Rheumatoid arthritis involving multiple sites with positive rheumatoid factor (HCC)   Femur fracture, right ([place])    1. Right knee periprosthetic fracture for which orthopedic surgery has been consulted and patient will be transferred to [facility] patient agreeable.  N.p.o. past midnight pain relief medications. 2. Hypertension on amlodipine beta-blockers.  Which will be continued. 3. Hyperlipidemia on statin. 4. Leukocytosis probably reactionary.  Patient afebrile closely monitor. 5. Rheumatoid arthritis on Plaquenil which can be continued after surgery.  Presently n.p.o. past midnight. 6. Chronic kidney disease stage III creatinine appears to be around baseline.  Follow metabolic panel.  Since patient has right knee periprosthetic fracture will need more than 2 midnight stay in inpatient status.   DVT prophylaxis: SCDs for now until surgery.  After surgery can start pharmacological DVT prophylaxis once okay with orthopedic surgery. Code Status: Full code. Family Communication: Patient's son. Disposition Plan: May need rehab. Consults called: Orthopedics. Admission status: Inpatient.   Rise Patience MD [facility] Pager [phone].  If 7PM-7AM, please contact night-coverage [email or web address] Password [facility]  01/03/2020, 11:22 PM

## 2020-01-03 NOTE — ED Provider Notes (Signed)
[facility] DEPT Provider Note   CSN: [id number] Arrival date & time: 01/03/20  2836     History Chief Complaint  Patient presents with  . Fall  . Knee Pain    Laura Chambers is a 79 y.o. female.  HPI   79 year old female with right knee pain.  Mechanical fall this before arrival.  Severe pain on her right knee.  Denies any other injuries.  No numbness or tingling.  She is not anticoagulated.  Prior bilateral knee replacements.    Past Medical History:  Diagnosis Date  . Anemia   . Anxiety   . Arthritis   . Atelectasis, left    left lower lung  . Blood transfusion without reported diagnosis   . Cataract cortical, senile, bilateral 03/19/2018  . Complication of anesthesia 2012   "irritated trachea" with cough x 1 year from intubation 2012.  smaller tube used with other surgeries  . Depression    just lost her son a month ago  . Esophageal hernia   . Fibromyalgia   . Headache    occasionally  . HTN (hypertension)   . Hx of colonic polyps   . Hydronephrosis of right kidney   . Hypercholesterolemia   . Hypertensive retinopathy of both eyes 03/19/2018  . Neuropathy due to medical condition ([place])    bilateral lower legs  . Obesity   . 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 Osteoporosis   . Renal angiomyolipoma   . Rheumatoid arthritis [facility])     Patient Active Problem List   Diagnosis Date Noted  . Rheumatoid arthritis involving multiple sites with positive rheumatoid factor ([place]) 10/13/2018  . Hx of colonic polyps 12/15/2017  . 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 Fibromyalgia 12/15/2017  . History of total right hip replacement 12/15/2017  . DDD (degenerative disc disease), lumbar 12/15/2017  . 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 Left foot drop 12/15/2017  . Reactive depression  (situational) 08/17/2017  . Status post revision of total knee, left 06/09/2017  . Obesity, Class III, BMI 40-49.9 (morbid obesity) ([place]) 12/28/2016  . Essential hypertension 12/08/2016  . Peripheral neuropathy 01/12/2014  . 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 Dyslipidemia 06/16/2012    Past Surgical History:  Procedure Laterality Date  . ABDOMINAL HYSTERECTOMY  1969  . BACK SURGERY  2012   rod in back-  lumbar disc removal with bone graft  . blephoroplasty     bilateral  . BREAST BIOPSY  1989  . CHOLECYSTECTOMY  2010  . EXCISIONAL TOTAL KNEE ARTHROPLASTY WITH ANTIBIOTIC SPACERS Left 11/14/2016   Procedure: EXCISIONAL LEFT TOTAL KNEE ARTHROPLASTY WITH  PLACEMENT OF ANTIBIOTIC SPACERS;  Surgeon: [name], MD;  Location: [facility];  Service: Orthopedics;  Laterality: Left;  . JOINT REPLACEMENT    . NISSEN FUNDOPLICATION    . PARAESOPHAGEAL HERNIA REPAIR  1989  . REVISION TOTAL KNEE ARTHROPLASTY Left 06/09/2017  . TONSILLECTOMY  1946  . TOTAL HIP ARTHROPLASTY Right 08/31/2012   Procedure: TOTAL HIP ARTHROPLASTY ANTERIOR APPROACH;  Surgeon: Mauri Pole, MD;  Location: [facility];  Service: Orthopedics;  Laterality: Right;  . TOTAL KNEE ARTHROPLASTY  2001   left  . TOTAL KNEE ARTHROPLASTY  2010   right  . TOTAL KNEE REVISION Left 06/09/2017   Procedure: LEFT REVISION KNEE ARTHROPLASTY;  Surgeon: Jean Rosenthal  Y, MD;  Location: [facility];  Service: Orthopedics;  Laterality: Left;     OB History   No obstetric history on file.     Family History  Problem Relation Age of Onset  . Diabetes Father   . Osteoarthritis Father   . Heart disease Father   . Stroke Father   . Osteoarthritis Mother   . Stroke Mother   . Pneumonia Mother   . Colon cancer Sister 71  . Colon polyps Other        neice/nephew  . Diabetes Sister   . Diabetes Brother     Social History   Tobacco Use  . Smoking status: Never Smoker  . Smokeless tobacco:  Never Used  Vaping Use  . Vaping Use: Never used  Substance Use Topics  . Alcohol use: No  . Drug use: No    Home Medications Prior to Admission medications   Medication Sig Start Date End Date Taking? Authorizing Provider  amLODipine (NORVASC) 10 MG tablet TAKE 1 TABLET (10 MG TOTAL) DAILY BY MOUTH. 08/04/19   Leamon Arnt, MD  atenolol (TENORMIN) 50 MG tablet TAKE 1 TABLET BY MOUTH EVERY DAY 02/28/19   Briscoe Deutscher, DO  Calcium Carbonate-Vitamin D (CALCIUM 600+D) 600-400 MG-UNIT tablet Take 1 tablet by mouth every other day.    [provider]  ferrous sulfate 325 (65 FE) MG EC tablet Take 325 mg by mouth 3 (three) times daily with meals.    [provider]  FLUoxetine (PROZAC) 20 MG capsule TAKE 1 CAPSULE BY MOUTH EVERY DAY IN THE MORNING 08/04/19   Leamon Arnt, MD  folic acid (FOLVITE) 809 MCG tablet Take 800 mcg daily by mouth.     [provider]  furosemide (LASIX) 40 MG tablet Take 1 tablet (40 mg total) daily as needed by mouth for edema. 05/18/17 12/21/19  Briscoe Deutscher, DO  gabapentin (NEURONTIN) 300 MG capsule TAKE 1 CAPSULE BY MOUTH 2 TIMES DAILY 08/22/19   Leamon Arnt, MD  Ginger, Zingiber officinalis, (GINGER ROOT PO) Take by mouth daily.    [provider]  hydroxychloroquine (PLAQUENIL) 200 MG tablet TAKE 1 TABLET BY MOUTH EVERY DAY 10/04/19   Bo Merino, MD  Misc Natural Products (TART CHERRY ADVANCED PO) Take by mouth.    [provider]  Multiple Vitamin (MULTIVITAMIN WITH MINERALS) TABS tablet Take 1 tablet by mouth every other day.    [provider]  Omega-3 1000 MG CAPS Take by mouth.    [provider]  promethazine (PHENERGAN) 25 MG tablet Take 1 tablet (25 mg total) by mouth every 8 (eight) hours as needed for nausea or vomiting. 10/17/19   Leamon Arnt, MD  rosuvastatin (CRESTOR) 10 MG tablet TAKE 1 TABLET BY MOUTH EVERY DAY 09/19/19   Leamon Arnt, MD  tamsulosin (FLOMAX) 0.4 MG  CAPS capsule Take 0.4 mg by mouth as needed.    [provider]  Turmeric 400 MG CAPS Take by mouth.    [provider]    Allergies    Ativan [lorazepam], Haldol [haloperidol], Epinephrine, Levofloxacin, Lipitor [atorvastatin], and Trovan [alatrofloxacin]  Review of Systems   Review of Systems All systems reviewed and negative, other than as noted in HPI.  Physical Exam Updated Vital Signs BP (!) 151/72 (BP Location: Right Arm)   Pulse 75   Temp 98 F (36.7 C) (Oral)   Resp 18   SpO2 99%   Physical Exam Vitals and nursing note  reviewed.  Constitutional:      General: She is not in acute distress.    Appearance: She is well-developed.  HENT:     Head: Normocephalic and atraumatic.  Eyes:     General:        Right eye: No discharge.        Left eye: No discharge.     Conjunctiva/sclera: Conjunctivae normal.  Cardiovascular:     Rate and Rhythm: Normal rate and regular rhythm.     Heart sounds: Normal heart sounds. No murmur heard.  No friction rub. No gallop.   Pulmonary:     Effort: Pulmonary effort is normal. No respiratory distress.     Breath sounds: Normal breath sounds.  Abdominal:     General: There is no distension.     Palpations: Abdomen is soft.     Tenderness: There is no abdominal tenderness.  Musculoskeletal:        General: Swelling and tenderness present.     Cervical back: Neck supple.     Comments: Mild swelling of the right knee.  Surgical scars noted.  Significant tenderness and crepitus noted at knee/distal femur.  Did not attempt ROM.  Neurovascular intact distally.  Skin:    General: Skin is warm and dry.  Neurological:     Mental Status: She is alert.  Psychiatric:        Behavior: Behavior normal.        Thought Content: Thought content normal.     ED Results / Procedures / Treatments   Labs (all labs ordered are listed, but only abnormal results are displayed) Labs Reviewed  SARS CORONAVIRUS 2 BY RT PCR ([place], [facility])  CBC WITH DIFFERENTIAL/PLATELET  BASIC METABOLIC PANEL    EKG None  Radiology DG Tibia/Fibula Right  Result Date: 01/03/2020 CLINICAL DATA:  Right knee pain after fall EXAM: RIGHT TIBIA AND FIBULA - 2 VIEW COMPARISON:  07/29/2017 FINDINGS: Frontal and lateral views of the right tibia and fibula are obtained. Portions of the right knee arthroplasty are excluded by collimation but are fully evaluated on the corresponding right knee evaluation. I do not see any tibial or fibular fractures. There is diffuse subcutaneous edema. IMPRESSION: 1. No acute fracture of the right tibia or fibula. Electronically Signed   By: Randa Ngo M.D.   On: 01/03/2020 20:14   DG Knee Complete 4 Views Right  Result Date: 01/03/2020 CLINICAL DATA:  [place] today, right knee pain EXAM: RIGHT KNEE - COMPLETE 4+ VIEW COMPARISON:  07/29/2017 FINDINGS: Frontal, bilateral oblique, lateral views of the right knee are obtained. There is a comminuted displaced periprosthetic fracture of the distal right femur. There is approximately 2.4 cm of dorsal displacement of the distal fracture fragment. The femoral and tibial components of the arthroplasty remain well aligned. No other bony abnormalities. There is diffuse soft tissue swelling. IMPRESSION: 1. Comminuted and displaced periprosthetic distal right femur fracture. Electronically Signed   By: Randa Ngo M.D.   On: 01/03/2020 20:13   DG Hip Unilat W or Wo Pelvis 2-3 Views Right  Result Date: 01/03/2020 CLINICAL DATA:  [place], right knee pain EXAM: DG HIP (WITH OR WITHOUT PELVIS) 2-3V RIGHT COMPARISON:  07/29/2017 FINDINGS: Frontal view of the pelvis as well as frontal and cross-table lateral views of the right hip are obtained. Portions of the distal margin of the femoral prosthesis are excluded on the lateral view by collimation. The right hip appears in normal anatomic  alignment without change since prior study. No acute pelvic  fractures. Mild left hip osteoarthritis. Postsurgical changes are seen at L4/L5. IMPRESSION: 1. No acute pelvic or right hip fracture. Electronically Signed   By: Randa Ngo M.D.   On: 01/03/2020 20:14    Procedures Procedures (including critical care time)  Medications Ordered in ED Medications  HYDROmorphone (DILAUDID) injection 1 mg (1 mg Intramuscular Given 01/03/20 1916)    ED Course  I have reviewed the triage vital signs and the nursing notes.  Pertinent labs & imaging results that were available during my care of the patient were reviewed by me and considered in my medical decision making (see chart for details).    MDM Rules/Calculators/A&P                          79 year old female with right knee pain after mechanical fall.  Imaging significant for periprosthetic distal right femur fracture.  Closed injury.  Neurovascularly intact.  Will place in a knee immobilizer.  Orthopedic surgery consultation.  Final Clinical Impression(s) / ED Diagnoses Final diagnoses:  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, initial encounter    Rx / DC Orders ED Discharge Orders    None       Virgel Manifold, MD 01/09/20 0700

## 2020-01-03 NOTE — ED Notes (Signed)
Patient fell about 4pm today.    C/O right lower leg pain.   Family in lobby

## 2020-01-03 NOTE — Progress Notes (Addendum)
I have spoken to Dr. Marcelino Scot and Haddix of [facility] and they have reviewed the images and will discuss plans for surgical repair with the patient.  She will need to be transferred to  for the surgery.  NPO after midnight.   Knee immobilizer at all times.  Greatly appreciate admission to hospitalist service.    Azucena Cecil, MD [facility] [phone] 10:05 PM

## 2020-01-04 ENCOUNTER — Inpatient Hospital Stay (HOSPITAL_COMMUNITY): Payer: PPO

## 2020-01-04 ENCOUNTER — Inpatient Hospital Stay (HOSPITAL_COMMUNITY): Payer: PPO | Admitting: Certified Registered"

## 2020-01-04 ENCOUNTER — Encounter (HOSPITAL_COMMUNITY)
Admission: EM | Disposition: A | Discharge status: Skilled Nursing Facility | Payer: Self-pay | Source: Home / Self Care | Attending: Internal Medicine

## 2020-01-04 ENCOUNTER — Encounter (HOSPITAL_COMMUNITY): Payer: Self-pay | Admitting: Internal Medicine

## 2020-01-04 ENCOUNTER — Other Ambulatory Visit: Payer: Self-pay | Admitting: Rheumatology

## 2020-01-04 DIAGNOSIS — M0579 Rheumatoid arthritis with rheumatoid factor of multiple sites without organ or systems involvement: Secondary | ICD-10-CM

## 2020-01-04 DIAGNOSIS — S7290XA Unspecified fracture of unspecified femur, initial encounter for closed fracture: Secondary | ICD-10-CM

## 2020-01-04 HISTORY — PX: ORIF FEMUR FRACTURE: SHX2119

## 2020-01-04 LAB — BASIC METABOLIC PANEL
Anion gap: 8 (ref 5–15)
BUN: 32 mg/dL — ABNORMAL HIGH (ref 8–23)
CO2: 24 mmol/L (ref 22–32)
Calcium: 8.6 mg/dL — ABNORMAL LOW (ref 8.9–10.3)
Chloride: 109 mmol/L (ref 98–111)
Creatinine, Ser: 1.45 mg/dL — ABNORMAL HIGH (ref 0.44–1.00)
GFR calc Af Amer: 40 mL/min — ABNORMAL LOW (ref >60)
GFR calc non Af Amer: 34 mL/min — ABNORMAL LOW (ref >60)
Glucose, Bld: 141 mg/dL — ABNORMAL HIGH (ref 70–99)
Potassium: 4.6 mmol/L (ref 3.5–5.1)
Sodium: 141 mmol/L (ref 135–145)

## 2020-01-04 LAB — CBC
HCT: 34.2 % — ABNORMAL LOW (ref 36.0–46.0)
HCT: 36 % (ref 36.0–46.0)
Hemoglobin: 10.9 g/dL — ABNORMAL LOW (ref 12.0–15.0)
Hemoglobin: 11.1 g/dL — ABNORMAL LOW (ref 12.0–15.0)
MCH: 29.1 pg (ref 26.0–34.0)
MCH: 28.8 pg (ref 26.0–34.0)
MCHC: 31.9 g/dL (ref 30.0–36.0)
MCHC: 30.8 g/dL (ref 30.0–36.0)
MCV: 91.2 fL (ref 80.0–100.0)
MCV: 93.3 fL (ref 80.0–100.0)
Platelets: 177 10*3/uL (ref 150–400)
Platelets: 212 10*3/uL (ref 150–400)
RBC: 3.75 MIL/uL — ABNORMAL LOW (ref 3.87–5.11)
RBC: 3.86 MIL/uL — ABNORMAL LOW (ref 3.87–5.11)
RDW: 13.2 % (ref 11.5–15.5)
RDW: 13.3 % (ref 11.5–15.5)
WBC: 9.8 10*3/uL (ref 4.0–10.5)
WBC: 13.4 10*3/uL — ABNORMAL HIGH (ref 4.0–10.5)
nRBC: 0 % (ref 0.0–0.2)
nRBC: 0 % (ref 0.0–0.2)

## 2020-01-04 LAB — CREATININE, SERUM
Creatinine, Ser: 1.58 mg/dL — ABNORMAL HIGH (ref 0.44–1.00)
GFR calc Af Amer: 36 mL/min — ABNORMAL LOW (ref >60)
GFR calc non Af Amer: 31 mL/min — ABNORMAL LOW (ref >60)

## 2020-01-04 SURGERY — OPEN REDUCTION INTERNAL FIXATION (ORIF) DISTAL FEMUR FRACTURE
Anesthesia: General | Site: Leg Upper | Laterality: Right

## 2020-01-04 MED ORDER — VANCOMYCIN HCL 1000 MG IV SOLR
INTRAVENOUS | Status: DC | PRN
  Administered 2020-01-04: 1000 mg via TOPICAL

## 2020-01-04 MED ORDER — ACETAMINOPHEN 500 MG PO TABS: 1000.0000 mg | ORAL_TABLET | Freq: Once | ORAL | Status: AC

## 2020-01-04 MED ORDER — FENTANYL CITRATE (PF) 250 MCG/5ML IJ SOLN
INTRAMUSCULAR | Status: DC | PRN
  Administered 2020-01-04: 150 ug via INTRAVENOUS
  Administered 2020-01-04 (×2): 50 ug via INTRAVENOUS

## 2020-01-04 MED ORDER — ORAL CARE MOUTH RINSE: 15.0000 mL | Freq: Once | OROMUCOSAL | Status: AC

## 2020-01-04 MED ORDER — MORPHINE SULFATE (PF) 2 MG/ML IV SOLN: 0.5000 mg | INTRAVENOUS | Status: DC | PRN

## 2020-01-04 MED ORDER — METOCLOPRAMIDE HCL 5 MG PO TABS: 5.0000 mg | ORAL_TABLET | Freq: Three times a day (TID) | ORAL | Status: DC | PRN

## 2020-01-04 MED ORDER — ROCURONIUM BROMIDE 10 MG/ML (PF) SYRINGE
PREFILLED_SYRINGE | INTRAVENOUS | Status: DC | PRN
  Administered 2020-01-04: 60 mg via INTRAVENOUS

## 2020-01-04 MED ORDER — PROMETHAZINE HCL 25 MG/ML IJ SOLN: 6.2500 mg | INTRAMUSCULAR | Status: DC | PRN

## 2020-01-04 MED ORDER — ONDANSETRON HCL 4 MG/2ML IJ SOLN
INTRAMUSCULAR | Status: DC | PRN
  Administered 2020-01-04: 4 mg via INTRAVENOUS

## 2020-01-04 MED ORDER — CHLORHEXIDINE GLUCONATE 0.12 % MT SOLN
15.0000 mL | Freq: Once | OROMUCOSAL | Status: AC
  Administered 2020-01-04: 15 mL via OROMUCOSAL
  Filled 2020-01-04: qty 15

## 2020-01-04 MED ORDER — POLYETHYLENE GLYCOL 3350 17 G PO PACK: 17.0000 g | PACK | Freq: Every day | ORAL | Status: DC | PRN

## 2020-01-04 MED ORDER — ENSURE ENLIVE PO LIQD
237.0000 mL | Freq: Every day | ORAL | Status: DC
  Administered 2020-01-08: 237 mL via ORAL

## 2020-01-04 MED ORDER — POTASSIUM CHLORIDE IN NACL 20-0.9 MEQ/L-% IV SOLN
INTRAVENOUS | Status: DC
  Filled 2020-01-04: qty 1000

## 2020-01-04 MED ORDER — METOCLOPRAMIDE HCL 5 MG/ML IJ SOLN: 5.0000 mg | Freq: Three times a day (TID) | INTRAMUSCULAR | Status: DC | PRN

## 2020-01-04 MED ORDER — PHENYLEPHRINE HCL-NACL 10-0.9 MG/250ML-% IV SOLN
INTRAVENOUS | Status: DC | PRN
  Administered 2020-01-04: 30 ug/min via INTRAVENOUS

## 2020-01-04 MED ORDER — CEFAZOLIN SODIUM-DEXTROSE 2-3 GM-%(50ML) IV SOLR
INTRAVENOUS | Status: DC | PRN
Start: 2020-01-04 — End: 2020-01-04
  Administered 2020-01-04: 2 g via INTRAVENOUS

## 2020-01-04 MED ORDER — OXYCODONE HCL 5 MG PO TABS
ORAL_TABLET | ORAL | Status: AC
  Filled 2020-01-04: qty 1

## 2020-01-04 MED ORDER — 0.9 % SODIUM CHLORIDE (POUR BTL) OPTIME
TOPICAL | Status: DC | PRN
  Administered 2020-01-04: 1000 mL

## 2020-01-04 MED ORDER — HYDROMORPHONE HCL 1 MG/ML IJ SOLN
INTRAMUSCULAR | Status: AC
  Filled 2020-01-04: qty 1

## 2020-01-04 MED ORDER — HYDROMORPHONE HCL 1 MG/ML IJ SOLN
0.5000 mg | INTRAMUSCULAR | Status: DC | PRN
  Administered 2020-01-04 (×2): 0.5 mg via INTRAVENOUS
  Filled 2020-01-04 (×2): qty 1

## 2020-01-04 MED ORDER — SIMETHICONE 80 MG PO CHEW
80.0000 mg | CHEWABLE_TABLET | Freq: Four times a day (QID) | ORAL | Status: DC | PRN
  Administered 2020-01-04 – 2020-01-10 (×3): 80 mg via ORAL
  Filled 2020-01-04 (×4): qty 1

## 2020-01-04 MED ORDER — HYDROCODONE-ACETAMINOPHEN 5-325 MG PO TABS: 1.0000 | ORAL_TABLET | ORAL | Status: DC | PRN

## 2020-01-04 MED ORDER — PROPOFOL 10 MG/ML IV BOLUS
INTRAVENOUS | Status: DC | PRN
  Administered 2020-01-04: 120 mg via INTRAVENOUS

## 2020-01-04 MED ORDER — CEFAZOLIN SODIUM-DEXTROSE 2-4 GM/100ML-% IV SOLN
2.0000 g | Freq: Three times a day (TID) | INTRAVENOUS | Status: AC
  Administered 2020-01-05 (×3): 2 g via INTRAVENOUS
  Filled 2020-01-04 (×3): qty 100

## 2020-01-04 MED ORDER — ADULT MULTIVITAMIN W/MINERALS CH
1.0000 | ORAL_TABLET | Freq: Every day | ORAL | Status: DC
  Administered 2020-01-05 – 2020-01-11 (×7): 1 via ORAL
  Filled 2020-01-04 (×7): qty 1

## 2020-01-04 MED ORDER — OXYCODONE HCL 5 MG/5ML PO SOLN: 5.0000 mg | Freq: Once | ORAL | Status: AC | PRN

## 2020-01-04 MED ORDER — EPHEDRINE SULFATE-NACL 50-0.9 MG/10ML-% IV SOSY
PREFILLED_SYRINGE | INTRAVENOUS | Status: DC | PRN
  Administered 2020-01-04: 10 mg via INTRAVENOUS

## 2020-01-04 MED ORDER — HYDROCODONE-ACETAMINOPHEN 7.5-325 MG PO TABS
1.0000 | ORAL_TABLET | ORAL | Status: DC | PRN
  Administered 2020-01-05: 2 via ORAL
  Filled 2020-01-04: qty 2

## 2020-01-04 MED ORDER — OXYCODONE HCL 5 MG PO TABS
5.0000 mg | ORAL_TABLET | Freq: Once | ORAL | Status: AC | PRN
  Administered 2020-01-04: 5 mg via ORAL

## 2020-01-04 MED ORDER — ONDANSETRON HCL 4 MG/2ML IJ SOLN
4.0000 mg | Freq: Four times a day (QID) | INTRAMUSCULAR | Status: DC | PRN
  Administered 2020-01-06: 4 mg via INTRAVENOUS
  Filled 2020-01-04: qty 2

## 2020-01-04 MED ORDER — DEXAMETHASONE SODIUM PHOSPHATE 10 MG/ML IJ SOLN
INTRAMUSCULAR | Status: DC | PRN
  Administered 2020-01-04: 10 mg via INTRAVENOUS

## 2020-01-04 MED ORDER — MIDAZOLAM HCL 2 MG/2ML IJ SOLN: 0.5000 mg | Freq: Once | INTRAMUSCULAR | Status: DC | PRN

## 2020-01-04 MED ORDER — METHOCARBAMOL 500 MG PO TABS
500.0000 mg | ORAL_TABLET | Freq: Four times a day (QID) | ORAL | Status: DC | PRN
  Administered 2020-01-04 – 2020-01-11 (×8): 500 mg via ORAL
  Filled 2020-01-04 (×7): qty 1

## 2020-01-04 MED ORDER — METHOCARBAMOL 1000 MG/10ML IJ SOLN
500.0000 mg | Freq: Four times a day (QID) | INTRAVENOUS | Status: DC | PRN
  Filled 2020-01-04: qty 5

## 2020-01-04 MED ORDER — HYDROMORPHONE HCL 1 MG/ML IJ SOLN
0.2500 mg | INTRAMUSCULAR | Status: DC | PRN
  Administered 2020-01-04 (×2): 0.5 mg via INTRAVENOUS

## 2020-01-04 MED ORDER — FENTANYL CITRATE (PF) 250 MCG/5ML IJ SOLN
INTRAMUSCULAR | Status: AC
  Filled 2020-01-04: qty 5

## 2020-01-04 MED ORDER — ACETAMINOPHEN 325 MG PO TABS
325.0000 mg | ORAL_TABLET | Freq: Four times a day (QID) | ORAL | Status: DC | PRN
  Administered 2020-01-04 – 2020-01-11 (×4): 650 mg via ORAL
  Filled 2020-01-04 (×4): qty 2

## 2020-01-04 MED ORDER — LIDOCAINE 2% (20 MG/ML) 5 ML SYRINGE
INTRAMUSCULAR | Status: DC | PRN
  Administered 2020-01-04: 40 mg via INTRAVENOUS

## 2020-01-04 MED ORDER — LACTATED RINGERS IV SOLN: INTRAVENOUS | Status: DC

## 2020-01-04 MED ORDER — DOCUSATE SODIUM 100 MG PO CAPS
100.0000 mg | ORAL_CAPSULE | Freq: Two times a day (BID) | ORAL | Status: DC
  Administered 2020-01-04 – 2020-01-11 (×12): 100 mg via ORAL
  Filled 2020-01-04 (×13): qty 1

## 2020-01-04 MED ORDER — ENOXAPARIN SODIUM 40 MG/0.4ML ~~LOC~~ SOLN
40.0000 mg | SUBCUTANEOUS | Status: DC
  Administered 2020-01-05: 40 mg via SUBCUTANEOUS
  Filled 2020-01-04: qty 0.4

## 2020-01-04 MED ORDER — METHOCARBAMOL 500 MG PO TABS
ORAL_TABLET | ORAL | Status: AC
  Filled 2020-01-04: qty 1

## 2020-01-04 MED ORDER — MEPERIDINE HCL 25 MG/ML IJ SOLN: 6.2500 mg | INTRAMUSCULAR | Status: DC | PRN

## 2020-01-04 MED ORDER — ONDANSETRON HCL 4 MG PO TABS
4.0000 mg | ORAL_TABLET | Freq: Four times a day (QID) | ORAL | Status: DC | PRN
  Administered 2020-01-07 – 2020-01-09 (×2): 4 mg via ORAL
  Filled 2020-01-04 (×2): qty 1

## 2020-01-04 MED ORDER — SUGAMMADEX SODIUM 200 MG/2ML IV SOLN
INTRAVENOUS | Status: DC | PRN
  Administered 2020-01-04: 200 mg via INTRAVENOUS

## 2020-01-04 MED ORDER — CEFAZOLIN SODIUM-DEXTROSE 2-4 GM/100ML-% IV SOLN
INTRAVENOUS | Status: AC
  Filled 2020-01-04: qty 100

## 2020-01-04 MED ORDER — ACETAMINOPHEN 500 MG PO TABS
ORAL_TABLET | ORAL | Status: AC
  Administered 2020-01-04: 1000 mg via ORAL
  Filled 2020-01-04: qty 2

## 2020-01-04 MED ORDER — VANCOMYCIN HCL 1000 MG IV SOLR
INTRAVENOUS | Status: AC
  Filled 2020-01-04: qty 1000

## 2020-01-04 SURGICAL SUPPLY — 80 items
APL PRP STRL LF DISP 70% ISPRP (MISCELLANEOUS) ×1
BIT DRILL 4.3 (BIT) ×2 IMPLANT
BIT DRILL 4.3X300MM (BIT) IMPLANT
BIT DRILL LONG 3.3 (BIT) ×1 IMPLANT
BIT DRILL QC 3.3X195 (BIT) ×1 IMPLANT
BLADE CLIPPER SURG (BLADE) IMPLANT
BNDG CMPR MED 10X6 ELC LF (GAUZE/BANDAGES/DRESSINGS)
BNDG COHESIVE 6X5 TAN STRL LF (GAUZE/BANDAGES/DRESSINGS) ×2 IMPLANT
BNDG ELASTIC 4X5.8 VLCR STR LF (GAUZE/BANDAGES/DRESSINGS) ×1 IMPLANT
BNDG ELASTIC 6X10 VLCR STRL LF (GAUZE/BANDAGES/DRESSINGS) ×1 IMPLANT
BNDG ELASTIC 6X5.8 VLCR STR LF (GAUZE/BANDAGES/DRESSINGS) ×2 IMPLANT
BRUSH SCRUB EZ PLAIN DRY (MISCELLANEOUS) ×4 IMPLANT
CANISTER SUCT 3000ML PPV (MISCELLANEOUS) ×2 IMPLANT
CAP LOCK NCB (Cap) ×9 IMPLANT
CHLORAPREP W/TINT 26 (MISCELLANEOUS) ×2 IMPLANT
COVER SURGICAL LIGHT HANDLE (MISCELLANEOUS) ×2 IMPLANT
COVER WAND RF STERILE (DRAPES) ×2 IMPLANT
DRAPE C-ARM 42X72 X-RAY (DRAPES) ×2 IMPLANT
DRAPE C-ARMOR (DRAPES) ×2 IMPLANT
DRAPE HALF SHEET 40X57 (DRAPES) ×4 IMPLANT
DRAPE ORTHO SPLIT 77X108 STRL (DRAPES) ×4
DRAPE SURG 17X23 STRL (DRAPES) ×2 IMPLANT
DRAPE SURG ORHT 6 SPLT 77X108 (DRAPES) ×2 IMPLANT
DRAPE U-SHAPE 47X51 STRL (DRAPES) ×1 IMPLANT
DRILL BIT 4.3 (BIT) ×2
DRSG ADAPTIC 3X8 NADH LF (GAUZE/BANDAGES/DRESSINGS) IMPLANT
DRSG MEPILEX BORDER 4X12 (GAUZE/BANDAGES/DRESSINGS) ×1 IMPLANT
DRSG MEPILEX BORDER 4X4 (GAUZE/BANDAGES/DRESSINGS) IMPLANT
DRSG MEPILEX BORDER 4X8 (GAUZE/BANDAGES/DRESSINGS) ×1 IMPLANT
DRSG PAD ABDOMINAL 8X10 ST (GAUZE/BANDAGES/DRESSINGS) ×3 IMPLANT
ELECT REM PT RETURN 9FT ADLT (ELECTROSURGICAL) ×2
ELECTRODE REM PT RTRN 9FT ADLT (ELECTROSURGICAL) ×1 IMPLANT
GAUZE SPONGE 4X4 12PLY STRL (GAUZE/BANDAGES/DRESSINGS) ×1 IMPLANT
GLOVE BIO SURGEON STRL SZ 6.5 (GLOVE) ×6 IMPLANT
GLOVE BIO SURGEON STRL SZ7.5 (GLOVE) ×8 IMPLANT
GLOVE BIOGEL PI IND STRL 6.5 (GLOVE) ×1 IMPLANT
GLOVE BIOGEL PI IND STRL 7.5 (GLOVE) ×1 IMPLANT
GLOVE BIOGEL PI INDICATOR 6.5 (GLOVE) ×1
GLOVE BIOGEL PI INDICATOR 7.5 (GLOVE) ×1
GOWN STRL REUS W/ TWL LRG LVL3 (GOWN DISPOSABLE) ×3 IMPLANT
GOWN STRL REUS W/TWL LRG LVL3 (GOWN DISPOSABLE) ×6
K-WIRE 2.0 (WIRE) ×4
K-WIRE FXSTD 280X2XNS SS (WIRE) ×2
KIT BASIN OR (CUSTOM PROCEDURE TRAY) ×2 IMPLANT
KIT TURNOVER KIT B (KITS) ×2 IMPLANT
KWIRE FXSTD 280X2XNS SS (WIRE) IMPLANT
NS IRRIG 1000ML POUR BTL (IV SOLUTION) ×2 IMPLANT
PACK TOTAL JOINT (CUSTOM PROCEDURE TRAY) ×2 IMPLANT
PAD ARMBOARD 7.5X6 YLW CONV (MISCELLANEOUS) ×4 IMPLANT
PAD CAST 4YDX4 CTTN HI CHSV (CAST SUPPLIES) ×1 IMPLANT
PADDING CAST COTTON 4X4 STRL (CAST SUPPLIES) ×2
PADDING CAST COTTON 6X4 STRL (CAST SUPPLIES) ×2 IMPLANT
PLATE DISTAL FEMUR 15H 317M RT (Plate) ×1 IMPLANT
SCREW 5.0 70MM (Screw) ×1 IMPLANT
SCREW 5.0 80MM (Screw) ×2 IMPLANT
SCREW CORTICAL NCB 5.0X65 (Screw) ×1 IMPLANT
SCREW NCB 3.5X75X5X6.2XST (Screw) IMPLANT
SCREW NCB 4.0MX55M (Screw) ×1 IMPLANT
SCREW NCB 4.0X36MM (Screw) ×1 IMPLANT
SCREW NCB 5.0X36MM (Screw) ×1 IMPLANT
SCREW NCB 5.0X38 (Screw) ×1 IMPLANT
SCREW NCB 5.0X55MM (Screw) ×1 IMPLANT
SCREW NCB 5.0X75MM (Screw) ×6 IMPLANT
SCREW UNI 5.0 12MM (Screw) ×1 IMPLANT
SPONGE LAP 18X18 RF (DISPOSABLE) IMPLANT
STAPLER VISISTAT 35W (STAPLE) ×2 IMPLANT
SUCTION FRAZIER HANDLE 10FR (MISCELLANEOUS) ×2
SUCTION TUBE FRAZIER 10FR DISP (MISCELLANEOUS) ×1 IMPLANT
SUT ETHILON 3 0 PS 1 (SUTURE) ×2 IMPLANT
SUT MNCRL AB 3-0 PS2 18 (SUTURE) ×1 IMPLANT
SUT MNCRL AB 3-0 PS2 27 (SUTURE) ×1 IMPLANT
SUT VIC AB 0 CT1 27 (SUTURE) ×2
SUT VIC AB 0 CT1 27XBRD ANBCTR (SUTURE) IMPLANT
SUT VIC AB 1 CT1 27 (SUTURE)
SUT VIC AB 1 CT1 27XBRD ANBCTR (SUTURE) IMPLANT
SUT VIC AB 2-0 CT1 27 (SUTURE) ×4
SUT VIC AB 2-0 CT1 TAPERPNT 27 (SUTURE) ×2 IMPLANT
TOWEL GREEN STERILE (TOWEL DISPOSABLE) ×4 IMPLANT
TRAY FOLEY MTR SLVR 16FR STAT (SET/KITS/TRAYS/PACK) IMPLANT
WATER STERILE IRR 1000ML POUR (IV SOLUTION) ×4 IMPLANT

## 2020-01-04 NOTE — Anesthesia Preprocedure Evaluation (Addendum)
Anesthesia Evaluation  Patient identified by MRN, date of birth, ID band Patient awake    Reviewed: Allergy & Precautions, NPO status , Patient's Chart, lab work & pertinent test results, reviewed documented beta blocker date and time   History of Anesthesia Complications (+) history of anesthetic complications (sore throat after intubations)  Airway Mallampati: I  TM Distance: >3 FB Neck ROM: Full    Dental  (+) Dental Advisory Given   Pulmonary neg pulmonary ROS,  01/03/2020 SARS coronavirus NEG   breath sounds clear to auscultation       Cardiovascular hypertension, Pt. on medications and Pt. on home beta blockers (-) angina Rhythm:Regular Rate:Normal  '18 ECHO: Left ventricle: The cavity size was normal. Wall thickness was  increased in a pattern of mild LVH. Systolic function was normal.  The estimated ejection fraction was in the range of 60% to 65%.  Wall motion was normal; there were no regional wall motion  abnormalities. The study is not technically sufficient to allow  evaluation of LV diastolic function. Doppler parameters are  consistent with high ventricular filling pressure.  - Aortic valve: Transvalvular velocity was within the normal range.  There was no stenosis. There was no regurgitation.  - Mitral valve: Mildly calcified annulus. Transvalvular velocity  was within the normal range. There was no evidence for stenosis.  There was trivial regurgitation.  - Right ventricle: The cavity size was normal. Wall thickness was  normal. Systolic function was normal.  - Tricuspid valve: There was trivial regurgitation   Neuro/Psych  Headaches, Anxiety Depression    GI/Hepatic Neg liver ROS, GERD  Controlled,  Endo/Other  Morbid obesity  Renal/GU Renal InsufficiencyRenal disease (creat 1.45)     Musculoskeletal  (+) Arthritis , Fibromyalgia -  Abdominal (+) + obese,   Peds   Hematology  (+) Blood dyscrasia (Hb 10.9), anemia ,   Anesthesia Other Findings   Reproductive/Obstetrics                            Anesthesia Physical Anesthesia Plan  ASA: III  Anesthesia Plan: General   Post-op Pain Management:    Induction: Intravenous  PONV Risk Score and Plan: 3 and Ondansetron, Dexamethasone and Treatment may vary due to age or medical condition  Airway Management Planned: Oral ETT and Video Laryngoscope Planned  Additional Equipment: None  Intra-op Plan:   Post-operative Plan: Extubation in OR  Informed Consent: I have reviewed the patients History and Physical, chart, labs and discussed the procedure including the risks, benefits and alternatives for the proposed anesthesia with the patient or authorized representative who has indicated his/her understanding and acceptance.     Dental advisory given  Plan Discussed with: CRNA and Surgeon  Anesthesia Plan Comments:         Anesthesia Quick Evaluation

## 2020-01-04 NOTE — Telephone Encounter (Signed)
Last Visit: 12/21/2019 °Next Visit: 05/23/2020 °Labs: 01/04/2020 CBC: RBC 3.75, Hgb 10.9, Hct 34.2, BMP: Glucose 141, BUN 32, Creat. 1.45, GFR 34, Calcium 8.6 °Eye exam: 12/13/2019 WNL °  °Current Dose per office note 12/21/2019: PLQ 200 mg 1 tablet by mouth daily  °DX:Rheumatoid arthritis  °  °Okay to refill Plaquenil? °

## 2020-01-04 NOTE — Transfer of Care (Signed)
Immediate Anesthesia Transfer of Care Note  Patient: Laura Chambers  Procedure(s) Performed: OPEN REDUCTION INTERNAL FIXATION (ORIF) DISTAL FEMUR FRACTURE (Right Leg Upper)  Patient Location: PACU  Anesthesia Type:General  Level of Consciousness: awake, alert , oriented and patient cooperative  Airway & Oxygen Therapy: Patient Spontanous Breathing and Patient connected to nasal cannula oxygen  Post-op Assessment: Report given to RN and Post -op Vital signs reviewed and stable  Post vital signs: Reviewed and stable  Last Vitals:  Vitals Value Taken Time  BP    Temp    Pulse    Resp    SpO2      Last Pain:  Vitals:   01/04/20 1308  TempSrc:   PainSc: 5       Patients Stated Pain Goal: 4 (10/08/57 1368)  Complications: No complications documented.

## 2020-01-04 NOTE — Consult Note (Signed)
Orthopaedic Trauma Service (OTS) Consult   Patient ID: Laura Chambers MRN: [id number] DOB/AGE: 80-Aug-1942 80 y.o.  Reason for Consult:Right periprosthetic distal femur fracture Referring Physician: Dr. Eduard Roux, MD Laura Chambers  HPI: Laura Chambers is an 79 y.o. female who is being seen in consultation at the request of Dr. Erlinda Hong for evaluation of right periprosthetic distal femur fracture.  The patient had a fall yesterday afternoon.  She sustained a comminuted displaced supracondylar periprosthetic femur fracture.  She had a knee replacement with Dr. Maureen Ralphs in 2010 on that side.  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  She had resection arthroplasty and a revision with Dr.Blackman after the infection cleared.  She had a subsequent foot drop on the left side.  She does have a history of rheumatoid arthritis and she has had some difficulty with her right ankle in the past.  She has been intermittently on a walker.  She lives alone.  She has family that is around as well as a LPN that is in the area that could assist postoperatively at home.  Patient was seen and examined in the preoperative holding area.  She is currently only complaining of right knee pain.  Denies any other injuries.  Denies any numbness or tingling.  She denies any tobacco use.  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  Past Medical History:  Diagnosis Date  . Anemia   . Anxiety   . Arthritis   . Atelectasis, left    left lower lung  . Blood transfusion without reported diagnosis   . Cataract cortical, senile, bilateral 03/19/2018  . Complication of anesthesia 2012   "irritated trachea" with cough x 1 year from intubation 2012.  smaller tube used with other surgeries  . Depression    just lost her son a month ago  . Esophageal hernia   . Fibromyalgia   . Headache    occasionally  . HTN (hypertension)   . Hx of colonic  polyps   . Hydronephrosis of right kidney   . Hypercholesterolemia   . Hypertensive retinopathy of both eyes 03/19/2018  . Neuropathy due to medical condition ([place])    bilateral lower legs  . Obesity   . 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 Osteoporosis   . Renal angiomyolipoma   . Rheumatoid arthritis [facility])     Past Surgical History:  Procedure Laterality Date  . ABDOMINAL HYSTERECTOMY  1969  . BACK SURGERY  2012   rod in back-  lumbar disc removal with bone graft  . blephoroplasty     bilateral  . BREAST BIOPSY  1989  . CHOLECYSTECTOMY  2010  . EXCISIONAL TOTAL KNEE ARTHROPLASTY WITH ANTIBIOTIC SPACERS Left 11/14/2016   Procedure: EXCISIONAL LEFT TOTAL KNEE ARTHROPLASTY WITH  PLACEMENT OF ANTIBIOTIC SPACERS;  Surgeon: [name], MD;  Location: [facility];  Service: Orthopedics;  Laterality: Left;  . JOINT REPLACEMENT    . NISSEN FUNDOPLICATION    . PARAESOPHAGEAL HERNIA REPAIR  1989  . REVISION TOTAL KNEE ARTHROPLASTY Left 06/09/2017  . TONSILLECTOMY  1946  . TOTAL HIP ARTHROPLASTY Right 08/31/2012   Procedure: TOTAL HIP ARTHROPLASTY ANTERIOR APPROACH;  Surgeon: Mauri Pole, MD;  Location: [facility];  Service: Orthopedics;  Laterality: Right;  . TOTAL KNEE ARTHROPLASTY  2001   left  . TOTAL KNEE ARTHROPLASTY  2010   right  .  TOTAL KNEE REVISION Left 06/09/2017   Procedure: LEFT REVISION KNEE ARTHROPLASTY;  Surgeon: [name], MD;  Location: [place];  Service: Orthopedics;  Laterality: Left;    Family History  Problem Relation Age of Onset  . Diabetes Father   . Osteoarthritis Father   . Heart disease Father   . Stroke Father   . Osteoarthritis Mother   . Stroke Mother   . Pneumonia Mother   . Colon cancer Sister 32  . Colon polyps Other        neice/nephew  . Diabetes Sister   . Diabetes Brother     Social History:  reports that she has never smoked. She has never used smokeless tobacco. She reports that she does not  drink alcohol and does not use drugs.  Allergies:  Allergies  Allergen Reactions  . Ativan [Lorazepam] Other (See Comments)    Highly irritable  . Haldol [Haloperidol] Other (See Comments)    Vegetative State  . Epinephrine Other (See Comments)    Reaction:  Increased pts BP and caused shoulder to jerk   . Levofloxacin Nausea And Vomiting  . Lipitor [Atorvastatin] Other (See Comments)    Reaction:  Joint pain and dizziness   . Trovan [Alatrofloxacin] Nausea And Vomiting    Pt states that this med caused pancreatitis.    Medications:  No current facility-administered medications on file prior to encounter.   Current Outpatient Medications on File Prior to Encounter  Medication Sig Dispense Refill  . amLODipine (NORVASC) 10 MG tablet TAKE 1 TABLET (10 MG TOTAL) DAILY BY MOUTH. (Patient taking differently: Take 10 mg by mouth every evening. TAKE 1 TABLET (10 MG TOTAL) DAILY BY MOUTH.) 90 tablet 1  . atenolol (TENORMIN) 50 MG tablet TAKE 1 TABLET BY MOUTH EVERY DAY 90 tablet 3  . Calcium Carbonate-Vitamin D (CALCIUM 600+D) 600-400 MG-UNIT tablet Take 1 tablet by mouth every other day.    . ferrous sulfate 325 (65 FE) MG EC tablet Take 325 mg by mouth daily with breakfast.     . FLUoxetine (PROZAC) 20 MG capsule TAKE 1 CAPSULE BY MOUTH EVERY DAY IN THE MORNING (Patient taking differently: Take 20 mg by mouth daily. ) 90 capsule 3  . folic acid (FOLVITE) 993 MCG tablet Take 800 mcg daily by mouth.     . furosemide (LASIX) 40 MG tablet Take 40 mg by mouth daily as needed for fluid or edema.    . gabapentin (NEURONTIN) 300 MG capsule TAKE 1 CAPSULE BY MOUTH 2 TIMES DAILY 180 capsule 1  . Ginger, Zingiber officinalis, (GINGER ROOT PO) Take 1 capsule by mouth daily.     . hydroxychloroquine (PLAQUENIL) 200 MG tablet TAKE 1 TABLET BY MOUTH EVERY DAY 90 tablet 0  . Misc Natural Products (TART CHERRY ADVANCED PO) Take 1 tablet by mouth daily.     . Multiple Vitamin (MULTIVITAMIN WITH MINERALS)  TABS tablet Take 1 tablet by mouth daily.     . Omega-3 1000 MG CAPS Take 1 capsule by mouth daily.     . promethazine (PHENERGAN) 25 MG tablet Take 1 tablet (25 mg total) by mouth every 8 (eight) hours as needed for nausea or vomiting. 30 tablet 0  . rosuvastatin (CRESTOR) 10 MG tablet TAKE 1 TABLET BY MOUTH EVERY DAY 90 tablet 3  . tamsulosin (FLOMAX) 0.4 MG CAPS capsule Take 0.4 mg by mouth daily as needed (urination).     . Turmeric 400 MG CAPS Take 1 capsule by mouth  daily.     . furosemide (LASIX) 40 MG tablet Take 1 tablet (40 mg total) daily as needed by mouth for edema. 90 tablet 0    ROS: Constitutional: No fever or chills Vision: No changes in vision ENT: No difficulty swallowing CV: No chest pain Pulm: No SOB or wheezing GI: No nausea or vomiting GU: No urgency or inability to hold urine Skin: No poor wound healing Neurologic: No numbness or tingling Psychiatric: No depression or anxiety Heme: No bruising Allergic: No reaction to medications or food   Exam: Blood pressure 109/64, pulse 65, temperature 98.8 F (37.1 C), temperature source Oral, resp. rate 18, height 5\' 7"  (1.702 m), weight 90.7 kg, SpO2 98 %. General: No acute distress Orientation: Awake alert and oriented x3 Mood and Affect: Cooperative and pleasant Gait: Unable to ambulate secondary to pain Coordination and balance: Within normal limits  Right lower extremity: Knee immobilizer is in place.  Lower extremity has deformity through the knee with a significant swelling.  Compartments are soft and compressible.  She has active dorsiflexion plantarflexion of her foot and ankle.  She has sensation intact to light touch to the dorsum and plantar aspect of her foot.  She has a warm well-perfused foot.  No skin lesions.  No lymphadenopathy.  Reflexes within normal limits.  Left lower extremity: Reveals well-healed previous total knee incision.  No active dorsiflexion but she is able to plantarflex and invert and  evert.  Sensation is diminished in the dorsum of her foot but is intact to the plantar aspect of her foot.  She has no obvious deformity she moves her knee and hip without pain or discomfort.  No skin lesions on her leg.   Medical Decision Making: Data: Imaging: X-rays of the knee show a periprosthetic distal femur fracture with significant comminution.  Knee replacement appears to be stable without any signs of loosening  Labs:  Results for orders placed or performed during the hospital encounter of 01/03/20 (from the past 24 hour(s))  CBC with Differential     Status: Abnormal   Collection Time: 01/03/20  8:56 PM  Result Value Ref Range   WBC 18.4 (H) 4.0 - 10.5 K/uL   RBC 4.35 3.87 - 5.11 MIL/uL   Hemoglobin 12.6 12.0 - 15.0 g/dL   HCT 40.3 36 - 46 %   MCV 92.6 80.0 - 100.0 fL   MCH 29.0 26.0 - 34.0 pg   MCHC 31.3 30.0 - 36.0 g/dL   RDW 13.2 11.5 - 15.5 %   Platelets 240 150 - 400 K/uL   nRBC 0.0 0.0 - 0.2 %   Neutrophils Relative % 88 %   Neutro Abs 16.1 (H) 1.7 - 7.7 K/uL   Lymphocytes Relative 7 %   Lymphs Abs 1.2 0.7 - 4.0 K/uL   Monocytes Relative 5 %   Monocytes Absolute 0.9 0 - 1 K/uL   Eosinophils Relative 0 %   Eosinophils Absolute 0.1 0 - 0 K/uL   Basophils Relative 0 %   Basophils Absolute 0.1 0 - 0 K/uL   Immature Granulocytes 0 %   Abs Immature Granulocytes 0.05 0.00 - 0.07 K/uL  Basic metabolic panel     Status: Abnormal   Collection Time: 01/03/20  8:56 PM  Result Value Ref Range   Sodium 143 135 - 145 mmol/L   Potassium 4.5 3.5 - 5.1 mmol/L   Chloride 109 98 - 111 mmol/L   CO2 24 22 - 32 mmol/L   Glucose,  Bld 157 (H) 70 - 99 mg/dL   BUN 34 (H) 8 - 23 mg/dL   Creatinine, Ser 1.75 (H) 0.44 - 1.00 mg/dL   Calcium 9.4 8.9 - 10.3 mg/dL   GFR calc non Af Amer 27 (L) >60 mL/min   GFR calc Af Amer 32 (L) >60 mL/min   Anion gap 10 5 - 15  SARS Coronavirus 2 by RT PCR (hospital order, performed in [place] hospital lab) Nasopharyngeal Nasopharyngeal Swab      Status: None   Collection Time: 01/03/20  8:56 PM   Specimen: Nasopharyngeal Swab  Result Value Ref Range   SARS Coronavirus 2 NEGATIVE NEGATIVE  CBC     Status: Abnormal   Collection Time: 01/04/20  4:39 AM  Result Value Ref Range   WBC 9.8 4.0 - 10.5 K/uL   RBC 3.75 (L) 3.87 - 5.11 MIL/uL   Hemoglobin 10.9 (L) 12.0 - 15.0 g/dL   HCT 34.2 (L) 36 - 46 %   MCV 91.2 80.0 - 100.0 fL   MCH 29.1 26.0 - 34.0 pg   MCHC 31.9 30.0 - 36.0 g/dL   RDW 13.2 11.5 - 15.5 %   Platelets 177 150 - 400 K/uL   nRBC 0.0 0.0 - 0.2 %  Basic metabolic panel     Status: Abnormal   Collection Time: 01/04/20  4:39 AM  Result Value Ref Range   Sodium 141 135 - 145 mmol/L   Potassium 4.6 3.5 - 5.1 mmol/L   Chloride 109 98 - 111 mmol/L   CO2 24 22 - 32 mmol/L   Glucose, Bld 141 (H) 70 - 99 mg/dL   BUN 32 (H) 8 - 23 mg/dL   Creatinine, Ser 1.45 (H) 0.44 - 1.00 mg/dL   Calcium 8.6 (L) 8.9 - 10.3 mg/dL   GFR calc non Af Amer 34 (L) >60 mL/min   GFR calc Af Amer 40 (L) >60 mL/min   Anion gap 8 5 - 15    Imaging or Labs ordered: None  Medical history and chart was reviewed and case discussed with medical provider.  Assessment/Plan: 79 year old female with a right periprosthetic distal femur fracture.  Patient has an unstable comminuted injury.  I recommended proceeding with open reduction internal fixation.  I feel that there is enough bone distally to allow for quality fixation.  I did discuss with her the risks associated with surgery including bleeding, infection, malunion, nonunion, hardware failure, hardware irritation, need for revision total arthroplasty including distal femoral placement, very low risk of nerve or blood vessel injury, DVT risk, even the risk of anesthesia.  She agreed to proceed with surgery and consent was obtained.  Shona Needles, MD [facility] [phone] (office) [email or web address]

## 2020-01-04 NOTE — Anesthesia Procedure Notes (Signed)
Procedure Name: Intubation Date/Time: 01/04/2020 3:43 PM Performed by: Griffin Dakin, CRNA Pre-anesthesia Checklist: Patient identified, Emergency Drugs available, Suction available and Patient being monitored Patient Re-evaluated:Patient Re-evaluated prior to induction Oxygen Delivery Method: Circle system utilized Preoxygenation: Pre-oxygenation with 100% oxygen Induction Type: IV induction Ventilation: Mask ventilation without difficulty Laryngoscope Size: Glidescope and 3 Grade View: Grade I Tube type: Oral Number of attempts: 1 Airway Equipment and Method: Stylet and Oral airway Placement Confirmation: ETT inserted through vocal cords under direct vision,  positive ETCO2 and breath sounds checked- equal and bilateral Secured at: 22 cm Tube secured with: Tape Dental Injury: Teeth and Oropharynx as per pre-operative assessment

## 2020-01-04 NOTE — Anesthesia Postprocedure Evaluation (Signed)
Anesthesia Post Note  Patient: ROCKY RISHEL  Procedure(s) Performed: OPEN REDUCTION INTERNAL FIXATION (ORIF) DISTAL FEMUR FRACTURE (Right Leg Upper)     Patient location during evaluation: PACU Anesthesia Type: General Level of consciousness: awake and alert Pain management: pain level controlled Vital Signs Assessment: post-procedure vital signs reviewed and stable Respiratory status: spontaneous breathing, nonlabored ventilation and respiratory function stable Cardiovascular status: blood pressure returned to baseline and stable Postop Assessment: no apparent nausea or vomiting Anesthetic complications: no   No complications documented.  Last Vitals:  Vitals:   01/04/20 1807 01/04/20 1822  BP: 123/63 114/61  Pulse: 66 64  Resp: 16 12  Temp:    SpO2: 96% 93%    Last Pain:  Vitals:   01/04/20 1822  TempSrc:   PainSc: Asleep    LLE Motor Response: Purposeful movement;Responds to commands (01/04/20 1822) LLE Sensation: Full sensation (01/04/20 1822)          Audry Pili

## 2020-01-04 NOTE — Progress Notes (Signed)
PROGRESS NOTE    Laura Chambers  ERD:[id number] DOB: 10-Feb-1941 DOA: 01/03/2020 PCP: Leamon Arnt, MD   Brief Narrative:  Laura Chambers is a 79 y.o. female with history of rheumatoid arthritis hypertension hyperlipidemia presents to the ER after patient had a fall.  Patient states he has left foot drop and recently was diagnosed with problems with the right foot arch by her rheumatologist walks with help of walker when she slipped and fell.  Emerita head but did not lose consciousness.  Denies any chest pain or shortness of breath.  Since the fall patient has been having pain around the right knee and was brought to the ED. X-rays in the ED shows features concerning for right periprosthetic knee fracture.  Plan for orthopedic surgeon Dr. Erlinda Hong was consulted and patient will be [facility] to [facility].  EKG shows normal sinus rhythm.  Chest x-ray unremarkable labs are significant for mild leukocytosis patient afebrile.  Covid test was negative.  Patient has chronic kidney disease creatinine is around 1.7. Hospitalist consulted for admission for hip fx as above with Ortho consult.   Assessment & Plan:   Principal Problem:   Closed femur fracture ([place]) Active Problems:   Dyslipidemia   Essential hypertension   Obesity, Class III, BMI 40-49.9 (morbid obesity) (HCC)   Rheumatoid arthritis involving multiple sites with positive rheumatoid factor (HCC)   Femur fracture, right (H[facility])   Right knee periprosthetic fracture - Orthopedic surgery has been consulted  - Tentative plan previously to transfer to [facility] for procedure, pending clinical decision with Ortho team  Hypertension, essential - Continue on amlodipine beta-blockers  Hyperlipidemia - Continue on statin.  Leukemoid reaction - remains afebrile - without complaint  Rheumatoid arthritis ' -Currently on Plaquenil at home - which can be continued after surgery.  HUD1S - at baseline Lab Results  Component  Value Date   CREATININE 1.45 (H) 01/04/2020   CREATININE 1.75 (H) 01/03/2020   CREATININE 1.34 (H) 12/21/2019      DVT prophylaxis: SCDs perioperatively Code Status: Full Family Communication: None at bedside  Status is: INPT  Dispo: The patient is from: Home              Anticipated d/c is to: Pending surgery and post-op PT/OT              Anticipated d/c date is: 48-72h              Patient currently not medically stable for discharge, pending surgical evaluation and potential procedure  Consultants:   Ortho  Procedures:   Per Ortho (ORIF tentatively planned)  Antimicrobials:  None indicated  Subjective: No acute issues or events per nursing staff, patient unavailable for interview today as she remains off the floor and in surgery  Objective: Vitals:   01/04/20 0300 01/04/20 0415 01/04/20 0530 01/04/20 0715  BP: (!) 131/54 (!) 124/58 (!) 107/45 (!) 136/58  Pulse: 69 64 (!) 58 (!) 59  Resp: 18 17 20 18   Temp:      TempSrc:      SpO2: 94% 98% 99% 96%   No intake or output data in the 24 hours ending 01/04/20 0753 There were no vitals filed for this visit.  Examination:  Unable to evaluate patient, off the floor for procedure  Data Reviewed: I have personally reviewed following labs and imaging studies  CBC: Recent Labs  Lab 01/03/20 2056 01/04/20 0439  WBC 18.4* 9.8  NEUTROABS 16.1*  --  HGB 12.6 10.9*  HCT 40.3 34.2*  MCV 92.6 91.2  PLT 240 616   Basic Metabolic Panel: Recent Labs  Lab 01/03/20 2056 01/04/20 0439  NA 143 141  K 4.5 4.6  CL 109 109  CO2 24 24  GLUCOSE 157* 141*  BUN 34* 32*  CREATININE 1.75* 1.45*  CALCIUM 9.4 8.6*   GFR: Estimated Creatinine Clearance: 37.4 mL/min (A) (by C-G formula based on SCr of 1.45 mg/dL (H)). Liver Function Tests: No results for input(s): AST, ALT, ALKPHOS, BILITOT, PROT, ALBUMIN in the last 168 hours. No results for input(s): LIPASE, AMYLASE in the last 168 hours. No results for input(s):  AMMONIA in the last 168 hours. Coagulation Profile: No results for input(s): INR, PROTIME in the last 168 hours. Cardiac Enzymes: No results for input(s): CKTOTAL, CKMB, CKMBINDEX, TROPONINI in the last 168 hours. BNP (last 3 results) No results for input(s): PROBNP in the last 8760 hours. HbA1C: No results for input(s): HGBA1C in the last 72 hours. CBG: No results for input(s): GLUCAP in the last 168 hours. Lipid Profile: No results for input(s): CHOL, HDL, LDLCALC, TRIG, CHOLHDL, LDLDIRECT in the last 72 hours. Thyroid Function Tests: No results for input(s): TSH, T4TOTAL, FREET4, T3FREE, THYROIDAB in the last 72 hours. Anemia Panel: No results for input(s): VITAMINB12, FOLATE, FERRITIN, TIBC, IRON, RETICCTPCT in the last 72 hours. Sepsis Labs: No results for input(s): PROCALCITON, LATICACIDVEN in the last 168 hours.  Recent Results (from the past 240 hour(s))  SARS Coronavirus 2 by RT PCR (hospital order, performed in [facility] hospital lab) Nasopharyngeal Nasopharyngeal Swab     Status: None   Collection Time: 01/03/20  8:56 PM   Specimen: Nasopharyngeal Swab  Result Value Ref Range Status   SARS Coronavirus 2 NEGATIVE NEGATIVE Final    Comment: (NOTE) SARS-CoV-2 target nucleic acids are NOT DETECTED.  The SARS-CoV-2 RNA is generally detectable in upper and lower respiratory specimens during the acute phase of infection. The lowest concentration of SARS-CoV-2 viral copies this assay can detect is 250 copies / mL. A negative result does not preclude SARS-CoV-2 infection and should not be used as the sole basis for treatment or other patient management decisions.  A negative result may occur with improper specimen collection / handling, submission of specimen other than nasopharyngeal swab, presence of viral mutation(s) within the areas targeted by this assay, and inadequate number of viral copies (<250 copies / mL). A negative result must be combined with  clinical observations, patient history, and epidemiological information.  Fact Sheet for Patients:   StrictlyIdeas.no  Fact Sheet for Healthcare Providers: BankingDealers.co.za  This test is not yet approved or  cleared by the [place] FDA and has been authorized for detection and/or diagnosis of SARS-CoV-2 by FDA under an Emergency Use Authorization (EUA).  This EUA will remain in effect (meaning this test can be used) for the duration of the COVID-19 declaration under Section 564(b)(1) of the Act, 21 U.S.C. section 360bbb-3(b)(1), unless the authorization is terminated or revoked sooner.  Performed at [facility], [place] [address], [place], [place] [address]          Radiology Studies: DG Chest 1 View  Result Date: 01/03/2020 CLINICAL DATA:  Preoperative assessment for right femur fracture, hypertension EXAM: CHEST  1 VIEW COMPARISON:  06/09/2017 FINDINGS: Single frontal view of the chest demonstrates a stable cardiac silhouette. Mild atherosclerosis of the aortic arch. No airspace disease, effusion, or pneumothorax. Surgical clips at the gastroesophageal junction. No  acute bony abnormalities. IMPRESSION: 1. No acute intrathoracic process. Electronically Signed   By: Randa Ngo M.D.   On: 01/03/2020 20:58   DG Tibia/Fibula Right  Result Date: 01/03/2020 CLINICAL DATA:  Right knee pain after fall EXAM: RIGHT TIBIA AND FIBULA - 2 VIEW COMPARISON:  07/29/2017 FINDINGS: Frontal and lateral views of the right tibia and fibula are obtained. Portions of the right knee arthroplasty are excluded by collimation but are fully evaluated on the corresponding right knee evaluation. I do not see any tibial or fibular fractures. There is diffuse subcutaneous edema. IMPRESSION: 1. No acute fracture of the right tibia or fibula. Electronically Signed   By: Randa Ngo M.D.   On: 01/03/2020 20:14   DG Knee Complete 4 Views  Right  Result Date: 01/03/2020 CLINICAL DATA:  [place] today, right knee pain EXAM: RIGHT KNEE - COMPLETE 4+ VIEW COMPARISON:  07/29/2017 FINDINGS: Frontal, bilateral oblique, lateral views of the right knee are obtained. There is a comminuted displaced periprosthetic fracture of the distal right femur. There is approximately 2.4 cm of dorsal displacement of the distal fracture fragment. The femoral and tibial components of the arthroplasty remain well aligned. No other bony abnormalities. There is diffuse soft tissue swelling. IMPRESSION: 1. Comminuted and displaced periprosthetic distal right femur fracture. Electronically Signed   By: Randa Ngo M.D.   On: 01/03/2020 20:13   DG Hip Unilat W or Wo Pelvis 2-3 Views Right  Result Date: 01/03/2020 CLINICAL DATA:  [place], right knee pain EXAM: DG HIP (WITH OR WITHOUT PELVIS) 2-3V RIGHT COMPARISON:  07/29/2017 FINDINGS: Frontal view of the pelvis as well as frontal and cross-table lateral views of the right hip are obtained. Portions of the distal margin of the femoral prosthesis are excluded on the lateral view by collimation. The right hip appears in normal anatomic alignment without change since prior study. No acute pelvic fractures. Mild left hip osteoarthritis. Postsurgical changes are seen at L4/L5. IMPRESSION: 1. No acute pelvic or right hip fracture. Electronically Signed   By: Randa Ngo M.D.   On: 01/03/2020 20:14    Scheduled Meds: . amLODipine  10 mg Oral QPM  . atenolol  50 mg Oral QHS  . gabapentin  300 mg Oral BID  . rosuvastatin  10 mg Oral QHS   Continuous Infusions:   LOS: 1 day    Time spent: 67min   Little Ishikawa, DO [facility]  If 7PM-7AM, please contact night-coverage [email or web address]  01/04/2020, 7:53 AM

## 2020-01-04 NOTE — Op Note (Signed)
Orthopaedic Surgery Operative Note (CSN: [id number] ) Date of Surgery: 01/04/2020  Admit Date: 01/03/2020   Diagnoses: Pre-Op Diagnoses: Right periprosthetic distal femur fracture   Post-Op Diagnosis: Same  Procedures: CPT 27511-Open reduction internal fixation of right distal femur fracture  Surgeons : Primary: Shona Needles, MD  Assistant: Patrecia Pace, PA-C  Location: OR 9   Anesthesia:General  Antibiotics: Ancef 2g preop with 1 gm vancomycin powder placed topically   Tourniquet time:None  Estimated Blood NFAO:130 mL  Complications:None   Specimens:None   Implants: Implant Name Type Inv. Item Serial No. Manufacturer Lot No. LRB No. Used Action  PLATE DISTAL FEMUR 86V 317M RT - [id number] Plate PLATE DISTAL FEMUR 28U 317M RT  [employer](ORTH,TRAU,BIO,SG)  Right 1 Implanted  SCREW NCB 5.0X75MM - [id number] Screw SCREW NCB 5.0X75MM  [employer](ORTH,TRAU,BIO,SG)  Right 3 Implanted  SCREW 5.0 80MM - [id number] Screw SCREW 5.0 80MM  [employer](ORTH,TRAU,BIO,SG)  Right 1 Implanted  SCREW NCB 5.0X36MM - [id number] Screw SCREW NCB 5.0X36MM  [employer](ORTH,TRAU,BIO,SG)  Right 1 Implanted  SCREW NCB 5.0X38 - [id number] Screw SCREW NCB 5.0X38  [employer](ORTH,TRAU,BIO,SG)  Right 1 Implanted  SCREW 5.0 70MM - [id number] Screw SCREW 5.0 70MM  [employer](ORTH,TRAU,BIO,SG)  Right 1 Implanted  SCREW NCB 4.0X36MM - [id number] Screw SCREW NCB 4.0X36MM  [employer](ORTH,TRAU,BIO,SG)  Right 1 Implanted  SCREW NCB 4.9NA35T - [id number] Screw SCREW NCB 4.7CW23J  [employer](ORTH,TRAU,BIO,SG)  Right 1 Implanted  SCREW UNI 5.0 12MM - [id number] Screw SCREW UNI 5.0 12MM  [employer](ORTH,TRAU,BIO,SG)  Right 1 Implanted  SCREW NCB 5.0X55MM - [id number] Screw SCREW NCB 5.0X55MM  [employer](ORTH,TRAU,BIO,SG)  Right 1 Implanted  CAP LOCK NCB - [id number] Cap CAP LOCK NCB  [employer](ORTH,TRAU,BIO,SG)  Right 9 Implanted     Indications for Surgery: 79 year old female who sustained a fall  with a right periprosthetic distal femur fracture.  Due to the unstable nature of her injury I recommended proceeding with open reduction internal fixation.  I discussed risks and benefits with the patient.  Risks included but not limited to bleeding, infection, malunion, nonunion, hardware failure, hardware irritation, nerve and blood vessel injury, DVT, need for revision of total knee arthroplasty, need for distal femoral replacement, even the possibility of anesthetic complications.  The patient agreed to proceed with surgery and consent was obtained.  Operative Findings: Comminuted right interprosthetic distal femur fracture treated with open reduction internal fixation using [employer] 15 hole NCB distal femoral locking plate  Procedure: The patient was identified in the preoperative holding area. Consent was confirmed with the patient and their family and all questions were answered. The operative extremity was marked after confirmation with the patient. she was then brought back to the operating room by our anesthesia colleagues.  She was placed under general anesthetic and carefully transferred over to a radiolucent flat top table.  A bump was placed under her operative hip.  The right lower extremity was prepped and draped in usual sterile fashion.  A timeout was performed to verify the patient, the procedure, and the extremity.  Preoperative antibiotics were dosed.  Fluoroscopic imaging was obtained to show the unstable nature of her injury.  A lateral incision was carried down through skin and subcutaneous tissue along the distal femur.  I split the IT band in line with my incision.  I exposed the distal femur lateral condyle.  I then used a Cobb elevator to develop a plane between the femoral shaft and the vastus lateralis.  I then used  a 15 hole plate attached to the targeting arm to slide along the lateral cortex of the femur.  I used a triangle with the hip and knee flexed to align the  fracture appropriately.  I had my assistant perform traction to the lower extremity.  I used a 2.0 mm K wire distally to align the distal portion of the plate.  A percutaneous incision was made along the femoral shaft and a 3.3 mm drill bit was used to align the proximal portion of the plate to the femoral shaft.  Once alignment and reduction was obtained, I placed two 5.0 millimeter screws in the distal segment.  I did get some excellent fixation in the cement mantle.  There was still a slight amount of valgus to the distal segment and I was able to correct this by using a 4.0 millimeter screw just distal to the fracture to correct the coronal alignment.  I then returned to the femoral shaft and placed 5.0 millimeter screws in the shaft.  I confirmed alignment and reduction and then proceeded to remove the 3.3 mm drill bit in the femoral shaft and placed a 4.0 millimeter screw.  I placed a unicortical screw in the most proximal hole to prevent a stress riser between the plate and the femoral prosthesis.  I returned to the distal segment and placed 5.0 millimeter screws.  A total of six 5.0 millimeter screws were placed distally to provide fixation.  Locking caps were placed on all the femoral shaft screws.  They were also placed on the distal screws.  The targeting arm was removed.  Final fluoroscopic imaging was obtained.  The incision was copiously irrigated.  A gram of vancomycin powder was placed into the wound.  A layer closure was performed using 0 Vicryl, 2-0 Vicryl and 3-0 Monocryl.  Dermabond was used to seal the skin.  The patient was then awoken from anesthesia and taken to the PACU in stable condition.  Post Op Plan/Instructions: The patient will be touchdown weightbearing to the right lower extremity.  She will receive postoperative Ancef.  She will be placed on Lovenox for DVT prophylaxis.  We will have her mobilize with physical and Occupational Therapy.  I was present and performed the  entire surgery.  Patrecia Pace, PA-C did assist me throughout the case. An assistant was necessary given the difficulty in approach, maintenance of reduction and ability to instrument the fracture.   Katha Hamming, MD [facility]

## 2020-01-04 NOTE — Progress Notes (Addendum)
Initial Nutrition Assessment  RD working remotely.  DOCUMENTATION CODES:   Obesity unspecified  INTERVENTION:   -Once diet is advanced, add:  -Ensure Enlive po daily, each supplement provides 350 kcal and 20 grams of protein -MVI with minerals daily  NUTRITION DIAGNOSIS:   Increased nutrient needs related to post-op healing as evidenced by estimated needs.  GOAL:   Patient will meet greater than or equal to 90% of their needs  MONITOR:   PO intake, Supplement acceptance, Diet advancement, Labs, Weight trends, Skin, I & O's  REASON FOR ASSESSMENT:   Consult Hip fracture protocol  ASSESSMENT:   Laura Chambers is a 79 y.o. female with history of rheumatoid arthritis hypertension hyperlipidemia presents to the ER after patient had a fall.  Pt admitted with closed rt femur fracture.   Per orthopedics notes, plan for surgical repair today.   Pt down in OR at time of visit. Unable to obtain further nutrition-related history or perform nutrition-focused physical exam at this time.   Reviewed wt hx; wt has been stable over the past few years.   Pt with increased nutritional needs for post-op healing and would greatly benefit from addition of oral nutrition supplements.    Medications reviewed and include lactated ringers infusion @ 10 ml/hr.   Labs reviewed.   Diet Order:   Diet Order            Diet NPO time specified Except for: Sips with Meds  Diet effective midnight                 EDUCATION NEEDS:   Not appropriate for education at this time  Skin:  Skin Assessment: Reviewed RN Assessment  Last BM:  Unknown  Height:   Ht Readings from Last 1 Encounters:  01/04/20 5\' 7"  (1.702 m)    Weight:   Wt Readings from Last 1 Encounters:  01/04/20 90.7 kg    Ideal Body Weight:  61.4 kg  BMI:  Body mass index is 31.32 kg/m.  Estimated Nutritional Needs:   Kcal:  1850-2050  Protein:  95-110 grams  Fluid:  > 1.8 L    Loistine Chance, RD, LDN,  [place] Registered Dietitian II Certified Diabetes Care and Education Specialist Please refer to [facility] for RD and/or RD on-call/weekend/after hours pager

## 2020-01-04 NOTE — Telephone Encounter (Signed)
She is currently hospitalized due to having surgery today.    Ok to refill PLQ.

## 2020-01-05 ENCOUNTER — Encounter (HOSPITAL_COMMUNITY): Payer: Self-pay | Admitting: Student

## 2020-01-05 DIAGNOSIS — M0579 Rheumatoid arthritis with rheumatoid factor of multiple sites without organ or systems involvement: Secondary | ICD-10-CM

## 2020-01-05 DIAGNOSIS — I10 Essential (primary) hypertension: Secondary | ICD-10-CM

## 2020-01-05 LAB — BASIC METABOLIC PANEL
Anion gap: 9 (ref 5–15)
Anion gap: 7 (ref 5–15)
BUN: 31 mg/dL — ABNORMAL HIGH (ref 8–23)
BUN: 32 mg/dL — ABNORMAL HIGH (ref 8–23)
CO2: 25 mmol/L (ref 22–32)
CO2: 25 mmol/L (ref 22–32)
Calcium: 8.5 mg/dL — ABNORMAL LOW (ref 8.9–10.3)
Calcium: 7.8 mg/dL — ABNORMAL LOW (ref 8.9–10.3)
Chloride: 106 mmol/L (ref 98–111)
Chloride: 105 mmol/L (ref 98–111)
Creatinine, Ser: 2.03 mg/dL — ABNORMAL HIGH (ref 0.44–1.00)
Creatinine, Ser: 1.91 mg/dL — ABNORMAL HIGH (ref 0.44–1.00)
GFR calc Af Amer: 26 mL/min — ABNORMAL LOW (ref >60)
GFR calc Af Amer: 28 mL/min — ABNORMAL LOW (ref >60)
GFR calc non Af Amer: 23 mL/min — ABNORMAL LOW (ref >60)
GFR calc non Af Amer: 24 mL/min — ABNORMAL LOW (ref >60)
Glucose, Bld: 147 mg/dL — ABNORMAL HIGH (ref 70–99)
Glucose, Bld: 129 mg/dL — ABNORMAL HIGH (ref 70–99)
Potassium: 5.7 mmol/L — ABNORMAL HIGH (ref 3.5–5.1)
Potassium: 4.6 mmol/L (ref 3.5–5.1)
Sodium: 140 mmol/L (ref 135–145)
Sodium: 137 mmol/L (ref 135–145)

## 2020-01-05 LAB — URINALYSIS, ROUTINE W REFLEX MICROSCOPIC
Bilirubin Urine: NEGATIVE
Glucose, UA: NEGATIVE mg/dL
Hgb urine dipstick: NEGATIVE
Ketones, ur: NEGATIVE mg/dL
Leukocytes,Ua: NEGATIVE
Nitrite: NEGATIVE
Protein, ur: NEGATIVE mg/dL
Specific Gravity, Urine: 1.013 (ref 1.005–1.030)
pH: 5 (ref 5.0–8.0)

## 2020-01-05 LAB — CBC
HCT: 33.3 % — ABNORMAL LOW (ref 36.0–46.0)
Hemoglobin: 10 g/dL — ABNORMAL LOW (ref 12.0–15.0)
MCH: 28.7 pg (ref 26.0–34.0)
MCHC: 30 g/dL (ref 30.0–36.0)
MCV: 95.7 fL (ref 80.0–100.0)
Platelets: 192 10*3/uL (ref 150–400)
RBC: 3.48 MIL/uL — ABNORMAL LOW (ref 3.87–5.11)
RDW: 13.4 % (ref 11.5–15.5)
WBC: 9.5 10*3/uL (ref 4.0–10.5)
nRBC: 0 % (ref 0.0–0.2)

## 2020-01-05 LAB — VITAMIN D 25 HYDROXY (VIT D DEFICIENCY, FRACTURES): Vit D, 25-Hydroxy: 35.04 ng/mL (ref 30–100)

## 2020-01-05 MED ORDER — HYDROCODONE-ACETAMINOPHEN 5-325 MG PO TABS
1.0000 | ORAL_TABLET | ORAL | Status: DC | PRN
  Administered 2020-01-05 – 2020-01-06 (×3): 2 via ORAL
  Administered 2020-01-06: 1 via ORAL
  Administered 2020-01-07 – 2020-01-09 (×6): 2 via ORAL
  Filled 2020-01-05: qty 2
  Filled 2020-01-05: qty 1
  Filled 2020-01-05 (×8): qty 2

## 2020-01-05 MED ORDER — SODIUM ZIRCONIUM CYCLOSILICATE 10 G PO PACK
10.0000 g | PACK | Freq: Once | ORAL | Status: AC
  Administered 2020-01-05: 10 g via ORAL
  Filled 2020-01-05: qty 1

## 2020-01-05 MED ORDER — ENOXAPARIN SODIUM 30 MG/0.3ML ~~LOC~~ SOLN
30.0000 mg | SUBCUTANEOUS | Status: DC
  Administered 2020-01-06 – 2020-01-09 (×4): 30 mg via SUBCUTANEOUS
  Filled 2020-01-05 (×4): qty 0.3

## 2020-01-05 MED ORDER — MORPHINE SULFATE (PF) 2 MG/ML IV SOLN: 0.5000 mg | INTRAVENOUS | Status: DC | PRN

## 2020-01-05 MED ORDER — POLYETHYLENE GLYCOL 3350 17 G PO PACK: 17.0000 g | PACK | Freq: Every day | ORAL | Status: DC

## 2020-01-05 MED ORDER — HYDROXYCHLOROQUINE SULFATE 200 MG PO TABS
200.0000 mg | ORAL_TABLET | Freq: Every day | ORAL | Status: DC
  Administered 2020-01-05: 200 mg via ORAL
  Filled 2020-01-05 (×2): qty 1

## 2020-01-05 MED ORDER — ORAL CARE MOUTH RINSE
15.0000 mL | Freq: Two times a day (BID) | OROMUCOSAL | Status: DC
  Administered 2020-01-05 – 2020-01-11 (×11): 15 mL via OROMUCOSAL

## 2020-01-05 MED ORDER — SODIUM CHLORIDE 0.9 % IV SOLN: INTRAVENOUS | Status: DC

## 2020-01-05 NOTE — Evaluation (Signed)
Physical Therapy Evaluation Patient Details Name: Laura Chambers MRN: [id number] DOB: 25-Dec-1940 Today's Date: 01/05/2020   History of Present Illness  79 y.o. female with R periprosthetic distal femur fx due to fall on 01/03/20. S.p ORIF femur 01/04/20. PMH L foot drop, osteoporosis, OA, neuropathy, HTN, HLD, anxiety  Clinical Impression  Pt presents with an overall decrease in functional mobility, generalized weakness, pain, decreased balance and decreased cardiopulmonary endurance secondary to above. PTA, pt lives alone and reports she worked for [employer]. Educ on TDWB precautions, pt verbalized precautions and required continued verbal and tactile cues to maintain during session. Today, pt able to sit EOB for >31min with external assistance to maintain balance due to pain. Attempted to stand x2 total (A)+2, pt ultimately unable to stand and was able to lateral scoot up in bed for repositioning. Recommended SNF level therapies prior to d/c home, pt declined. Pt would benefit from continued acute PT services to maximize functional mobility and independence prior to d/c to next venue of care.     Follow Up Recommendations SNF (pt would rather go home)    Equipment Recommendations  Wheelchair cushion (measurements PT);Wheelchair (measurements PT);3in1 (PT);Rolling walker with 5" wheels    Recommendations for Other Services       Precautions / Restrictions Precautions Precautions: Fall Restrictions Weight Bearing Restrictions: Yes RLE Weight Bearing: Touchdown weight bearing      Mobility  Bed Mobility Overal bed mobility: Needs Assistance Bed Mobility: Rolling;Sidelying to Sit;Supine to Sit;Sit to Supine Rolling: Min guard Sidelying to sit: Max assist;+2 for physical assistance;HOB elevated   Sit to supine: Total assist;+2 for physical assistance   General bed mobility comments: Assistance for trunk and LE  Transfers Overall transfer level: Needs assistance   Transfers: Sit  to/from Stand;Lateral/Scoot Transfers Sit to Stand: Total assist;+2 physical assistance        Lateral/Scoot Transfers: Total assist;+2 physical assistance General transfer comment: STS from elevated EOB with cues for hand placement, power negotiation, and WB precautions with use of RW.   Ambulation/Gait                Stairs            Wheelchair Mobility    Modified Rankin (Stroke Patients Only)       Balance Overall balance assessment: Needs assistance Sitting-balance support: Bilateral upper extremity supported;Feet supported Sitting balance-Leahy Scale: Poor Sitting balance - Comments: Initially required external assist to maintain upright position seated EOB. Progressed to Min guard.  Postural control: Right lateral lean   Standing balance-Leahy Scale: Zero Standing balance comment: Heavy realiance on BUE and external assist, unable to stand                             Pertinent Vitals/Pain Pain Assessment: Faces Faces Pain Scale: Hurts even more Pain Location: R knee Pain Descriptors / Indicators: Constant;Aching;Grimacing Pain Intervention(s): Limited activity within patient's tolerance;Monitored during session;Repositioned    Home Living Family/patient expects to be discharged to:: Private residence Living Arrangements: Alone Available Help at Discharge: Available 24 hours/day Type of Home: House Home Access: Stairs to enter Entrance Stairs-Rails: Can reach both Entrance Stairs-Number of Steps: 2 Home Layout: Two level;Laundry or work area in [employer]: Other (comment);Cane - single point;Bedside commode;Tub bench;Hand held shower head      Prior Function Level of Independence: Independent         Comments: Independent with BADLs/IADLs. Assist with heavy cleaning.  Hand Dominance   Dominant Hand: Right    Extremity/Trunk Assessment   Upper Extremity Assessment Upper Extremity Assessment: Defer to OT  evaluation    Lower Extremity Assessment Lower Extremity Assessment: RLE deficits/detail;LLE deficits/detail RLE Deficits / Details: Able to complete SLR for repositioning RLE: Unable to fully assess due to immobilization RLE Sensation: history of peripheral neuropathy LLE Deficits / Details: Grossly 2+/5 LLE hip, knee and ankle PF with AROM in bed, 0/5 ankle DF LLE Sensation: history of peripheral neuropathy       Communication   Communication: No difficulties  Cognition Arousal/Alertness: Awake/alert Behavior During Therapy: WFL for tasks assessed/performed Overall Cognitive Status: Within Functional Limits for tasks assessed                                 General Comments: Demonstrated increased fear and anxiety with mobility      General Comments General comments (skin integrity, edema, etc.): Vitals 4L vis [place] on entry. Removed [place], as pt does not have o2 at home, Pt dropped to 86%, Alpha replaced and o2 sats >90%. MD came in room and was notified, titrated o2 to 2L.    Exercises     Assessment/Plan    PT Assessment Patient needs continued PT services  PT Problem List Decreased strength;Decreased mobility;Decreased safety awareness;Decreased range of motion;Decreased coordination;Decreased knowledge of precautions;Decreased activity tolerance;Cardiopulmonary status limiting activity;Decreased balance;Decreased knowledge of use of DME;Impaired sensation;Pain       PT Treatment Interventions DME instruction;Therapeutic activities;Gait training;Therapeutic exercise;Patient/family education;Stair training;Balance training;Functional mobility training;Neuromuscular re-education    PT Goals (Current goals can be found in the Care Plan section)  Acute Rehab PT Goals Patient Stated Goal: To get stronger.  PT Goal Formulation: With patient Time For Goal Achievement: 01/19/20 Potential to Achieve Goals: Good    Frequency Min 5X/week   Barriers to discharge         Co-evaluation               AM-PAC PT "6 Clicks" Mobility  Outcome Measure Help needed turning from your back to your side while in a flat bed without using bedrails?: None Help needed moving from lying on your back to sitting on the side of a flat bed without using bedrails?: Total Help needed moving to and from a bed to a chair (including a wheelchair)?: Total Help needed standing up from a chair using your arms (e.g., wheelchair or bedside chair)?: Total Help needed to walk in hospital room?: Total Help needed climbing 3-5 steps with a railing? : Total 6 Click Score: 9    End of Session Equipment Utilized During Treatment: Gait belt Activity Tolerance: Patient limited by pain;Other (comment) (limited by fear) Patient left: in bed;with call bell/phone within reach;with bed alarm set Nurse Communication: Mobility status PT Visit Diagnosis: Unsteadiness on feet (R26.81);Other abnormalities of gait and mobility (R26.89);Muscle weakness (generalized) (M62.81);Other symptoms and signs involving the nervous system (R29.898);Pain Pain - Right/Left: Right Pain - part of body: Leg    Time: 0923-1006 PT Time Calculation (min) (ACUTE ONLY): 43 min   Charges:   PT Evaluation $PT Eval Moderate Complexity: 1 Mod          Georgena Weisheit SPT 01/05/2020  Rolland Porter 01/05/2020, 4:25 PM

## 2020-01-05 NOTE — Progress Notes (Signed)
PROGRESS NOTE    Laura Chambers  HEN:[id number] DOB: [birth year] DOA: 01/03/2020 PCP: Leamon Arnt, MD   Brief Narrative: 79 year old with past medical history significant for rheumatoid arthritis, hypertension, hyperlipidemia presented to the ER after a mechanical fall.  Patient states that she has a left foot drop and recently was diagnosed with problems with her right foot arch by her rheumatologist.  She walks with help of her walker.  She slipped and fell.  X-ray in the ED shows features concerning for right periprosthetic knee fracture.  Patient underwent open reduction and internal fixation of right distal femoral fracture by Dr. Doreatha Martin on 6/30.  Assessment & Plan:   Principal Problem:   Supracondylar fracture of right femur (HCC) Active Problems:   Dyslipidemia   Essential hypertension   Obesity, Class III, BMI 40-49.9 (morbid obesity) (HCC)   Rheumatoid arthritis involving multiple sites with positive rheumatoid factor (HCC)   Femur fracture, right (HCC)   1-Right knee periprostatic fracture: Orthopedic consulted and following Patient underwent open reduction and internal fixation of right distal femoral fracture by Dr. Doreatha Martin on 6/30 PT OT per surgery DVT prophylaxis: Lovenox Received antibiotics prophylaxis  2-Hypertension: Continue with atenolol.  Hold Norvasc due to soft blood pressure to avoid further worsening of renal function  3-Hyperlipidemia: Continue with statins  4-Rheumatoid arthritis: Resume plaquenil. She is on low dose. Monitor renal function.   5-AKI on CKD stage IIIb: Creatinine baseline 1.5. Worsening renal function post surgery , suspect related To hypovolemia hypotension. Monitor urine output, bladder scan. Change IV fluids to normal saline  6-Hyperkalemia; Patient was getting IV fluids with potassium supplementation.  I have changed fluids to normal saline. EKG without peak T wave.  Received a dose of Lokelma Repeat be met this  afternoon.  7-Acute blood loss anemia, post surgery expected.  Hemoglobin today stable at 10 8-Leukocytosis: Secondary to acute trauma: Resolved 9-acute hypoxic respiratory failure: Plus surgery requiring 2 L of oxygen. Incentive spirometry Careful with sedatives.    Nutrition Problem: Increased nutrient needs Etiology: post-op healing    Signs/Symptoms: estimated needs    Interventions: MVI, Ensure Enlive (each supplement provides 350kcal and 20 grams of protein)  Estimated body mass index is 31.32 kg/m as calculated from the following:   Height as of this encounter: _0  (1.702 m).   Weight as of this encounter: 90.7 kg.   DVT prophylaxis: Lovenox Code Status: Full code Family Communication: Care discussed with patient Disposition Plan:  Status is: Inpatient  Remains inpatient appropriate because:IV treatments appropriate due to intensity of illness or inability to take PO   Dispo: The patient is from: Home              Anticipated d/c is to: Home              Anticipated d/c date is: 2 days              Patient currently is not medically stable to d/c.  Patient requiring IV fluids for AKI post surgery.        Consultants:   Orthopedic  Procedures:  Patient underwent open reduction and internal fixation of right distal femoral fracture by Dr. Doreatha Martin on 6/30  Antimicrobials:    Subjective: She is feeling okay, she denies shortness of breath.  No bowel movement yet.  Right knee pain controlled  Objective: Vitals:   01/04/20 2300 01/05/20 0329 01/05/20 0819 01/05/20 1428  BP: 133/71 (!) 104/54 (!) 102/54 (!) 124/42  Pulse:  63 62 64 73  Resp: _0 Temp: 98.1 F (36.7 C) 97.9 F (36.6 C) 98.1 F (36.7 C) 98.6 F (37 C)  TempSrc: Oral Oral Oral Oral  SpO2: 94% 97% 97% 98%  Weight:      Height:        Intake/Output Summary (Last 24 hours) at 01/05/2020 1510 Last data filed at 01/05/2020 1500 Gross per 24 hour  Intake 2338.7 ml  Output  450 ml  Net 1888.7 ml   Filed Weights   01/04/20 1301  Weight: 90.7 kg    Examination:  General exam: Appears calm and comfortable  Respiratory system: Clear to auscultation. Respiratory effort normal. Cardiovascular system: S1 & S2 heard, RRR. No JVD, murmurs, rubs, gallops or clicks. No pedal edema. Gastrointestinal system: Abdomen is nondistended, soft and nontender. No organomegaly or masses felt. Normal bowel sounds heard. Central nervous system: Alert and oriented. No focal neurological deficits. Extremities: Right LE with dressing.   Data Reviewed: I have personally reviewed following labs and imaging studies  CBC: Recent Labs  Lab 01/03/20 2056 01/04/20 0439 01/04/20 2039 01/05/20 0342  WBC 18.4* 9.8 13.4* 9.5  NEUTROABS 16.1*  --   --   --   HGB 12.6 10.9* 11.1* 10.0*  HCT 40.3 34.2* 36.0 33.3*  MCV 92.6 91.2 93.3 95.7  PLT 240 177 212 481   Basic Metabolic Panel: Recent Labs  Lab 01/03/20 2056 01/04/20 0439 01/04/20 2039 01/05/20 0342 01/05/20 1146  NA 143 141  --  140 137  K 4.5 4.6  --  5.7* 4.6  CL 109 109  --  106 105  CO2 24 24  --  25 25  GLUCOSE 157* 141*  --  147* 129*  BUN 34* 32*  --  31* 32*  CREATININE 1.75* 1.45* 1.58* 2.03* 1.91*  CALCIUM 9.4 8.6*  --  8.5* 7.8*   GFR: Estimated Creatinine Clearance: 27.6 mL/min (A) (by C-G formula based on SCr of 1.91 mg/dL (H)). Liver Function Tests: No results for input(s): AST, ALT, ALKPHOS, BILITOT, PROT, ALBUMIN in the last 168 hours. No results for input(s): LIPASE, AMYLASE in the last 168 hours. No results for input(s): AMMONIA in the last 168 hours. Coagulation Profile: No results for input(s): INR, PROTIME in the last 168 hours. Cardiac Enzymes: No results for input(s): CKTOTAL, CKMB, CKMBINDEX, TROPONINI in the last 168 hours. BNP (last 3 results) No results for input(s): PROBNP in the last 8760 hours. HbA1C: No results for input(s): HGBA1C in the last 72 hours. CBG: No results for  input(s): GLUCAP in the last 168 hours. Lipid Profile: No results for input(s): CHOL, HDL, LDLCALC, TRIG, CHOLHDL, LDLDIRECT in the last 72 hours. Thyroid Function Tests: No results for input(s): TSH, T4TOTAL, FREET4, T3FREE, THYROIDAB in the last 72 hours. Anemia Panel: No results for input(s): VITAMINB12, FOLATE, FERRITIN, TIBC, IRON, RETICCTPCT in the last 72 hours. Sepsis Labs: No results for input(s): PROCALCITON, LATICACIDVEN in the last 168 hours.  Recent Results (from the past 240 hour(s))  SARS Coronavirus 2 by RT PCR (hospital order, performed in [facility] hospital lab) Nasopharyngeal Nasopharyngeal Swab     Status: None   Collection Time: 01/03/20  8:56 PM   Specimen: Nasopharyngeal Swab  Result Value Ref Range Status   SARS Coronavirus 2 NEGATIVE NEGATIVE Final    Comment: (NOTE) SARS-CoV-2 target nucleic acids are NOT DETECTED.  The SARS-CoV-2 RNA is generally detectable in upper and lower respiratory specimens during the acute phase  of infection. The lowest concentration of SARS-CoV-2 viral copies this assay can detect is 250 copies / mL. A negative result does not preclude SARS-CoV-2 infection and should not be used as the sole basis for treatment or other patient management decisions.  A negative result may occur with improper specimen collection / handling, submission of specimen other than nasopharyngeal swab, presence of viral mutation(s) within the areas targeted by this assay, and inadequate number of viral copies (<250 copies / mL). A negative result must be combined with clinical observations, patient history, and epidemiological information.  Fact Sheet for Patients:   StrictlyIdeas.no  Fact Sheet for Healthcare Providers: BankingDealers.co.za  This test is not yet approved or  cleared by the [place] FDA and has been authorized for detection and/or diagnosis of SARS-CoV-2 by FDA under an Emergency Use  Authorization (EUA).  This EUA will remain in effect (meaning this test can be used) for the duration of the COVID-19 declaration under Section 564(b)(1) of the Act, 21 U.S.C. section 360bbb-3(b)(1), unless the authorization is terminated or revoked sooner.  Performed at [facility], [place] [address], [place], [place] [address]          Radiology Studies: DG Chest 1 View  Result Date: 01/03/2020 CLINICAL DATA:  Preoperative assessment for right femur fracture, hypertension EXAM: CHEST  1 VIEW COMPARISON:  06/09/2017 FINDINGS: Single frontal view of the chest demonstrates a stable cardiac silhouette. Mild atherosclerosis of the aortic arch. No airspace disease, effusion, or pneumothorax. Surgical clips at the gastroesophageal junction. No acute bony abnormalities. IMPRESSION: 1. No acute intrathoracic process. Electronically Signed   By: Randa Ngo M.D.   On: 01/03/2020 20:58   DG Tibia/Fibula Right  Result Date: 01/03/2020 CLINICAL DATA:  Right knee pain after fall EXAM: RIGHT TIBIA AND FIBULA - 2 VIEW COMPARISON:  07/29/2017 FINDINGS: Frontal and lateral views of the right tibia and fibula are obtained. Portions of the right knee arthroplasty are excluded by collimation but are fully evaluated on the corresponding right knee evaluation. I do not see any tibial or fibular fractures. There is diffuse subcutaneous edema. IMPRESSION: 1. No acute fracture of the right tibia or fibula. Electronically Signed   By: Randa Ngo M.D.   On: 01/03/2020 20:14   DG Knee Complete 4 Views Right  Result Date: 01/03/2020 CLINICAL DATA:  [place] today, right knee pain EXAM: RIGHT KNEE - COMPLETE 4+ VIEW COMPARISON:  07/29/2017 FINDINGS: Frontal, bilateral oblique, lateral views of the right knee are obtained. There is a comminuted displaced periprosthetic fracture of the distal right femur. There is approximately 2.4 cm of dorsal displacement of the distal fracture fragment. The femoral  and tibial components of the arthroplasty remain well aligned. No other bony abnormalities. There is diffuse soft tissue swelling. IMPRESSION: 1. Comminuted and displaced periprosthetic distal right femur fracture. Electronically Signed   By: Randa Ngo M.D.   On: 01/03/2020 20:13   DG Knee Right Port  Result Date: 01/04/2020 CLINICAL DATA:  Periprosthetic right femoral fracture, ORIF EXAM: PORTABLE RIGHT KNEE - 1-2 VIEW COMPARISON:  01/04/2020, 01/03/2020 FINDINGS: Frontal and lateral views of the right knee demonstrate lateral plate and screw fixation traversing the comminuted distal right femoral periprosthetic fracture seen previously. The superior margin of the fusion plate is excluded by collimation. Alignment is near anatomic. Right knee is well aligned. Postsurgical changes are seen in the soft tissues. IMPRESSION: 1. ORIF of a periprosthetic right femoral fracture as above. Electronically Signed   By: Legrand Como  Ow[place] Shark M.D.   On: 01/04/2020 20:12   DG C-Arm 1-60 Min  Result Date: 01/04/2020 CLINICAL DATA:  Femur fracture EXAM: RIGHT FEMUR 2 VIEWS; DG C-ARM 1-60 MIN COMPARISON:  01/03/2020 FINDINGS: Nine low resolution intraoperative spot views of the right femur. Total fluoroscopy time was 1 minutes 57 seconds. The images demonstrate a comminuted distal femoral periprosthetic fracture with subsequent surgical plate and multiple screw fixation. Decreased fracture displacement and angulation. IMPRESSION: Intraoperative fluoroscopic assistance provided during surgical fixation of distal femur fracture. Electronically Signed   By: Donavan Foil M.D.   On: 01/04/2020 17:42   DG Hip Unilat W or Wo Pelvis 2-3 Views Right  Result Date: 01/03/2020 CLINICAL DATA:  [place], right knee pain EXAM: DG HIP (WITH OR WITHOUT PELVIS) 2-3V RIGHT COMPARISON:  07/29/2017 FINDINGS: Frontal view of the pelvis as well as frontal and cross-table lateral views of the right hip are obtained. Portions of the distal margin of  the femoral prosthesis are excluded on the lateral view by collimation. The right hip appears in normal anatomic alignment without change since prior study. No acute pelvic fractures. Mild left hip osteoarthritis. Postsurgical changes are seen at L4/L5. IMPRESSION: 1. No acute pelvic or right hip fracture. Electronically Signed   By: Randa Ngo M.D.   On: 01/03/2020 20:14   DG FEMUR, MIN 2 VIEWS RIGHT  Result Date: 01/04/2020 CLINICAL DATA:  Femur fracture EXAM: RIGHT FEMUR 2 VIEWS; DG C-ARM 1-60 MIN COMPARISON:  01/03/2020 FINDINGS: Nine low resolution intraoperative spot views of the right femur. Total fluoroscopy time was 1 minutes 57 seconds. The images demonstrate a comminuted distal femoral periprosthetic fracture with subsequent surgical plate and multiple screw fixation. Decreased fracture displacement and angulation. IMPRESSION: Intraoperative fluoroscopic assistance provided during surgical fixation of distal femur fracture. Electronically Signed   By: Donavan Foil M.D.   On: 01/04/2020 17:42        Scheduled Meds: . atenolol  50 mg Oral QHS  . docusate sodium  100 mg Oral BID  . [START ON 01/06/2020] enoxaparin (LOVENOX) injection  30 mg Subcutaneous Q24H  . feeding supplement (ENSURE ENLIVE)  237 mL Oral Q1400  . gabapentin  300 mg Oral BID  . hydroxychloroquine  200 mg Oral Daily  . mouth rinse  15 mL Mouth Rinse BID  . multivitamin with minerals  1 tablet Oral Daily  . rosuvastatin  10 mg Oral QHS   Continuous Infusions: . sodium chloride 75 mL/hr at 01/05/20 1500  .  ceFAZolin (ANCEF) IV Stopped (01/05/20 0936)  . methocarbamol (ROBAXIN) IV       LOS: 2 days    Time spent: 35 minutes.     Elmarie Shiley, MD [facility]   If 7PM-7AM, please contact night-coverage [email or web address]  01/05/2020, 3:10 PM

## 2020-01-05 NOTE — TOC Initial Note (Addendum)
Transition of Care [facility]) - Initial/Assessment Note    Patient Details  Name: Laura Chambers MRN: [id number] Date of Birth: [birth year]  Transition of Care [facility]) CM/SW Contact:    Sharin Mons, RN Phone Number: [phone] 01/05/2020, 10:19 AM  Clinical Narrative:      S/P  Open reduction internal fixation of right distal femur fracture, 6/30. From home alone.  NCM spoke with pt regarding d/c planning. PT/OT evaluations pending. Pt stated not interested in SNF/ rehab. Agreeable to [facility] services. States would like to use [facility], has used their services in the past. Referral made with [facility] pending orders and acceptance. Pt without DME needs, already has rolling walker and BSC. Pt states son and family/ freiends to assist with needs once d/c. Pt without Rx MED concerns or affordability. Pt states has transportation to home.  TOC team will continue to monitor for needs ....   01/05/2020 @ 11:27 am NCM made aware [facility] unable to accept pt 2/2 limited staffing. Referral made with [facility], [facility] , both declined. Referral made with  [facility]. [facility]mings accepted SOC , 7/7. NCM to alert MD  Expected Discharge Plan: [place] Barriers to Discharge: Continued Medical Work up   Patient Goals and CMS Choice     Choice offered to / list presented to : Patient  Expected Discharge Plan and Services Expected Discharge Plan: [place]   Discharge Planning Services: CM Consult                     DME Arranged: N/A DME Agency: NA       HH Arranged: PT [place] Agency: [facility] (now [facility]) Date [place]: 01/05/20   Representative spoke with at [place]: [place]  Prior Living Arrangements/Services   Lives with:: Self   Do you feel safe going back to the place where you live?: Yes               Activities of Daily Living Home Assistive Devices/Equipment: Eyeglasses, Environmental consultant (specify type), Wheelchair, Bedside  commode/3-in-1 (reading glasses, lift chair) ADL Screening (condition at time of admission) Patient's cognitive ability adequate to safely complete daily activities?: Yes Is the patient deaf or have difficulty hearing?: Yes Does the patient have difficulty seeing, even when wearing glasses/contacts?: Yes (suppose to have left eye cataract surgery) Does the patient have difficulty concentrating, remembering, or making decisions?: No Patient able to express need for assistance with ADLs?: Yes Does the patient have difficulty dressing or bathing?: No Independently performs ADLs?: No Communication: Independent Dressing (OT): Needs assistance Is this a change from baseline?: Change from baseline, expected to last >3 days Grooming: Independent Feeding: Independent Bathing: Needs assistance Is this a change from baseline?: Pre-admission baseline Toileting: Needs assistance Is this a change from baseline?: Pre-admission baseline In/Out Bed: Needs assistance Is this a change from baseline?: Pre-admission baseline Walks in Home: Needs assistance Is this a change from baseline?: Pre-admission baseline Does the patient have difficulty walking or climbing stairs?: Yes Weakness of Legs: Right Weakness of Arms/Hands: None  Permission Sought/Granted                  Emotional Assessment              Admission diagnosis:  Closed femur fracture ([place]) [S72.90XA] Femur fracture, right ([place]) [S72.91XA] 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, initial encounter [Y70.62BJ] Patient Active Problem List   Diagnosis Date  Noted  . Supracondylar fracture of right femur ([place]) 01/03/2020  . Femur fracture, right ([place]) 01/03/2020  . 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 Rheumatoid arthritis involving multiple sites with positive rheumatoid factor ([place]) 10/13/2018  . Hx of colonic polyps 12/15/2017  . 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 Fibromyalgia 12/15/2017  . History of total right hip replacement 12/15/2017  . DDD (degenerative disc disease), lumbar 12/15/2017  . 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 Left foot drop 12/15/2017  . 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 Status post revision of total knee, left 06/09/2017  . Obesity, Class III, BMI 40-49.9 (morbid obesity) ([place]) 12/28/2016  . Essential hypertension 12/08/2016  . Peripheral neuropathy 01/12/2014  . 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 Dyslipidemia 06/16/2012   PCP:  Leamon Arnt, MD Pharmacy:   [id number] - [place], [address] [place] [address] Phone: [phone] Fax: [phone]     Social Determinants of Health (SDOH) Interventions    Readmission Risk Interventions No flowsheet data found.

## 2020-01-05 NOTE — Plan of Care (Signed)

## 2020-01-05 NOTE — Evaluation (Signed)
Occupational Therapy Evaluation Patient Details Name: Laura Chambers MRN: [id number] DOB: 1940-09-21 Today's Date: 01/05/2020    History of Present Illness 79 y.o. female presenting after mechanical fall while ambulating with RW resulting in R periprosthetic distal femur fx. Pt. s/p ORIF on 6/30 by Dr. Doreatha Martin.   Clinical Impression   Prior to hospital admission, patient was living alone in a private residence and was independent with BADLs/IADLs with the exception of heavy housework. Patient reports independence with home and community mobility without AD. Patient currently presents below baseline level of function requiring Max A-Total A +2 grossly for bed mobility and sit to stand transfers with RW. Patient unable to progress to stand-pivot transfers secondary to fatigue, pain in LLE, and increased anxiety with movement noting fear of falling. Patient also requires grossly total A +2 for LB BADLs including LB bathing/dressing and toilet transfers. Patient would benefit from continued OT services to maximize safety and independence with self-care tasks, functional transfers and mobility. Recommendation for SNF rehab in prep for safe return to PLOF.     Follow Up Recommendations  SNF    Equipment Recommendations  Other (comment) (Defer to next level of care.)    Recommendations for Other Services       Precautions / Restrictions Precautions Precautions: None Restrictions Weight Bearing Restrictions: Yes RLE Weight Bearing: Touchdown weight bearing      Mobility Bed Mobility Overal bed mobility: Needs Assistance Bed Mobility: Rolling;Sidelying to Sit;Supine to Sit;Sit to Supine Rolling: Min guard (R<>L with use of bed rail and Min guard) Sidelying to sit: Max assist;+2 for physical assistance;HOB elevated   Sit to supine: Total assist;+2 for physical assistance      Transfers Overall transfer level: Needs assistance   Transfers: Sit to/from Stand;Lateral/Scoot  Transfers Sit to Stand: Total assist;+2 physical assistance (x2 attempts)        Lateral/Scoot Transfers: Total assist;+2 physical assistance General transfer comment: STS from elevated EOB with cues for hand placement, power negotiation, and WB precautions with use of RW.     Balance Overall balance assessment: Needs assistance Sitting-balance support: Bilateral upper extremity supported;Feet supported Sitting balance-Leahy Scale: Poor Sitting balance - Comments: Initially required external assist to maintain upright position seated EOB. Progressed to Min guard.  Postural control: Right lateral lean   Standing balance-Leahy Scale: Poor Standing balance comment: Heavy realiance on BUE and external assist.                           ADL either performed or assessed with clinical judgement   ADL Overall ADL's : Needs assistance/impaired     Grooming: Wash/dry hands;Set up;Bed level           Upper Body Dressing : Moderate assistance;Sitting   Lower Body Dressing: Total assistance;+2 for physical assistance   Toilet Transfer: Total assistance;+2 for physical assistance   Toileting- Clothing Manipulation and Hygiene: Total assistance;+2 for safety/equipment       Functional mobility during ADLs:  (Unable) General ADL Comments: Per clinical judgement and functional assessment.      Vision Baseline Vision/History: Wears glasses Wears Glasses: Reading only Patient Visual Report: No change from baseline Vision Assessment?: No apparent visual deficits     Perception     Praxis      Pertinent Vitals/Pain Pain Assessment: 0-10 Pain Score: 0-No pain Pain Location: R knee Pain Descriptors / Indicators: Constant;Aching;Grimacing Pain Intervention(s): Monitored during session     Hand Dominance Right  Extremity/Trunk Assessment Upper Extremity Assessment Upper Extremity Assessment: Generalized weakness   Lower Extremity Assessment Lower Extremity  Assessment: Defer to PT evaluation       Communication Communication Communication: No difficulties   Cognition Arousal/Alertness: Awake/alert Behavior During Therapy: WFL for tasks assessed/performed Overall Cognitive Status: Within Functional Limits for tasks assessed                                     General Comments  Vitals: 4L via [place] on entry. Dropped to 86% on RA in supine. BP 132/63. HR 76bpm.    Exercises     Shoulder Instructions      Home Living Family/patient expects to be discharged to:: Private residence Living Arrangements: Alone Available Help at Discharge: Available 24 hours/day Type of Home: House Home Access: Stairs to enter [employer] of Steps: 2 Entrance Stairs-Rails: Can reach both Home Layout: Two level;Laundry or work area in basement (Doesn't access basement)     Bathroom Shower/Tub: Teacher, early years/pre: Handicapped height     Home Equipment: Other (comment);Cane - single point;Bedside commode;Tub bench;Hand held shower head (Rollator, lift cahir)          Prior Functioning/Environment Level of Independence: Independent        Comments: Independent with BADLs/IADLs. Assist with heavy cleaning.         OT Problem List: Decreased strength;Decreased activity tolerance;Impaired balance (sitting and/or standing);Decreased knowledge of use of DME or AE;Decreased knowledge of precautions;Pain      OT Treatment/Interventions: Self-care/ADL training;Therapeutic exercise;Energy conservation;DME and/or AE instruction;Therapeutic activities;Patient/family education;Balance training    OT Goals(Current goals can be found in the care plan section) Acute Rehab OT Goals Patient Stated Goal: To get stronger.  OT Goal Formulation: With patient Time For Goal Achievement: 01/19/20 Potential to Achieve Goals: Good ADL Goals Pt Will Perform Grooming: with modified independence;sitting Pt Will Perform Upper  Body Dressing: with set-up;sitting Pt Will Perform Lower Body Dressing: sit to/from stand;with adaptive equipment;with min assist Pt Will Transfer to Toilet: with min assist;ambulating;bedside commode Pt Will Perform Toileting - Clothing Manipulation and hygiene: with min assist;sit to/from stand Pt/caregiver will Perform Home Exercise Program: Increased strength;Both right and left upper extremity;With written HEP provided Additional ADL Goal #1: Patient will complete BADLs with adherence to RLE TDWB preacautions without cueing.  OT Frequency: Min 2X/week   Barriers to D/C: Inaccessible home environment;Decreased caregiver support          Co-evaluation              AM-PAC OT "6 Clicks" Daily Activity     Outcome Measure Help from another person eating meals?: None Help from another person taking care of personal grooming?: A Little Help from another person toileting, which includes using toliet, bedpan, or urinal?: Total Help from another person bathing (including washing, rinsing, drying)?: Total Help from another person to put on and taking off regular upper body clothing?: A Little Help from another person to put on and taking off regular lower body clothing?: Total 6 Click Score: 13   End of Session Equipment Utilized During Treatment: Gait belt;Rolling walker Nurse Communication: Mobility status;Other (comment) (L brachial IV site bleeding.)  Activity Tolerance: Patient limited by fatigue;Patient limited by pain Patient left: in bed;with call bell/phone within reach;with bed alarm set  OT Visit Diagnosis: Unsteadiness on feet (R26.81);Muscle weakness (generalized) (M62.81);History of falling (Z91.81);Pain Pain - Right/Left: Left Pain -  part of body: Knee;Leg                Time: 0923-1006 OT Time Calculation (min): 43 min Charges:  OT General Charges $OT Visit: 1 Visit OT Evaluation $OT Eval Moderate Complexity: 1 Mod OT Treatments $Self Care/Home Management :  8-22 mins  Nikeshia Keetch H. OTR/L Supplemental OT, Department of rehab services [phone]  Morley Gaumer R H. 01/05/2020, 10:28 AM

## 2020-01-05 NOTE — Progress Notes (Signed)
Pt called requesting her cell phone.  States she had a black bag and a hospital bag with her cell phone and charger in it.    Checked under [facility], in all cabinets and drawers in room.  Called PACU - spoke with Chip.  He checked their personal belongings area and cabinet - no sign of them.  Chip states he will leave note for day shift and his Freight forwarder.

## 2020-01-05 NOTE — Progress Notes (Signed)
Orthopaedic Trauma Progress Note  S: Doing okay this morning, notes stiffness in the right knee.  Pain medications have been effective.  Was very hungry and is eager to eat breakfast.  Denies any nausea or vomiting.  No BM since surgery.  Has not been out of bed yet.  Patient states she does not want to go to SNF.  Would prefer to go home with home health.  Does note that she has several DME devices at home including a wheelchair, bedside commode, lift, walker.  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  O:  Vitals:   01/04/20 2300 01/05/20 0329  BP: 133/71 (!) 104/54  Pulse: 63 62  Resp: 17 17  Temp: 98.1 F (36.7 C) 97.9 F (36.6 C)  SpO2: 94% 97%    General: Sitting up in bed, no acute distress.  Pleasant and cooperative Respiratory: No increased work of breathing. Right lower extremity: Dressing is clean, dry, and intact.  Mild knee effusion.  Tenderness with palpation to the distal thigh but nontender through the lower leg, ankle, foot.  Patient has neuropathy at baseline but does endorse sensation to light touch intermittently throughout the lower extremity.  Able to wiggle each of her toes.  Ankle dorsiflexion plantarflexion is intact.  Tolerates a small amount of active and passive knee motion.  Extremity warm.+ DP pulse  Imaging: Stable post op imaging.   Labs:  Results for orders placed or performed during the hospital encounter of 01/03/20 (from the past 24 hour(s))  CBC     Status: Abnormal   Collection Time: 01/04/20  8:39 PM  Result Value Ref Range   WBC 13.4 (H) 4.0 - 10.5 K/uL   RBC 3.86 (L) 3.87 - 5.11 MIL/uL   Hemoglobin 11.1 (L) 12.0 - 15.0 g/dL   HCT 36.0 36 - 46 %   MCV 93.3 80.0 - 100.0 fL   MCH 28.8 26.0 - 34.0 pg   MCHC 30.8 30.0 - 36.0 g/dL   RDW 13.3 11.5 - 15.5 %   Platelets 212 150 - 400 K/uL   nRBC 0.0 0.0 - 0.2 %  Creatinine, serum     Status: Abnormal   Collection Time: 01/04/20  8:39 PM  Result Value Ref  Range   Creatinine, Ser 1.58 (H) 0.44 - 1.00 mg/dL   GFR calc non Af Amer 31 (L) >60 mL/min   GFR calc Af Amer 36 (L) >60 mL/min  Basic metabolic panel     Status: Abnormal   Collection Time: 01/05/20  3:42 AM  Result Value Ref Range   Sodium 140 135 - 145 mmol/L   Potassium 5.7 (H) 3.5 - 5.1 mmol/L   Chloride 106 98 - 111 mmol/L   CO2 25 22 - 32 mmol/L   Glucose, Bld 147 (H) 70 - 99 mg/dL   BUN 31 (H) 8 - 23 mg/dL   Creatinine, Ser 2.03 (H) 0.44 - 1.00 mg/dL   Calcium 8.5 (L) 8.9 - 10.3 mg/dL   GFR calc non Af Amer 23 (L) >60 mL/min   GFR calc Af Amer 26 (L) >60 mL/min   Anion gap 9 5 - 15  CBC     Status: Abnormal   Collection Time: 01/05/20  3:42 AM  Result Value Ref Range   WBC 9.5 4.0 - 10.5 K/uL   RBC 3.48 (L) 3.87 - 5.11 MIL/uL   Hemoglobin 10.0 (L) 12.0 - 15.0 g/dL   HCT 33.3 (L) 36 -  46 %   MCV 95.7 80.0 - 100.0 fL   MCH 28.7 26.0 - 34.0 pg   MCHC 30.0 30.0 - 36.0 g/dL   RDW 13.4 11.5 - 15.5 %   Platelets 192 150 - 400 K/uL   nRBC 0.0 0.0 - 0.2 %    Assessment: 79 year old female status post fall, 1 Day Post-Op   Injuries: Right periprosthetic distal femur fracture status post ORIF  Weightbearing: TDWB RLE  Insicional and dressing care: Plan to change dressing tomorrow  Showering: Hold off on showering for now.  Okay for bed bath  Orthopedic device(s): None   CV/Blood loss: Acute blood loss anemia, Hgb 10.0 this morning. Hemodynamically stable  Pain management:  1. Tylenol 325-650 mg q 6 hours PRN 2. Robaxin 500 mg q 6 hours PRN 3. Norco 5-325 mg q 4 hours PRN 4. Neurontin 300 mg BID 5. Morphine 0.5-1 mg q 2 hours PRN  VTE prophylaxis: Lovenox, SCDs  ID: Ancef 2gm post op  Foley/Lines: No foley, KVO IVFs  Medical co-morbidities: Rheumatoid arthritis, hypertension, hyperlipidemia  Impediments to Fracture Healing: Vitamin D level pending, will start supplementation as indicated.  Dispo: PT/OT evaluation today.  Dispo pending.  Patient states she does  not want to go to SNF, would prefer home with home health if possible  Follow - up plan: 2 weeks  Contact information:  Katha Hamming MD, Patrecia Pace PA-C   Sena Hoopingarner A. [facility] [phone] (office) [email or web address]

## 2020-01-06 DIAGNOSIS — D62 Acute posthemorrhagic anemia: Secondary | ICD-10-CM

## 2020-01-06 LAB — BASIC METABOLIC PANEL
Anion gap: 6 (ref 5–15)
BUN: 30 mg/dL — ABNORMAL HIGH (ref 8–23)
CO2: 24 mmol/L (ref 22–32)
Calcium: 8.1 mg/dL — ABNORMAL LOW (ref 8.9–10.3)
Chloride: 107 mmol/L (ref 98–111)
Creatinine, Ser: 1.59 mg/dL — ABNORMAL HIGH (ref 0.44–1.00)
GFR calc Af Amer: 35 mL/min — ABNORMAL LOW (ref >60)
GFR calc non Af Amer: 31 mL/min — ABNORMAL LOW (ref >60)
Glucose, Bld: 92 mg/dL (ref 70–99)
Potassium: 4.5 mmol/L (ref 3.5–5.1)
Sodium: 137 mmol/L (ref 135–145)

## 2020-01-06 LAB — CBC
HCT: 27.9 % — ABNORMAL LOW (ref 36.0–46.0)
Hemoglobin: 8.5 g/dL — ABNORMAL LOW (ref 12.0–15.0)
MCH: 29.1 pg (ref 26.0–34.0)
MCHC: 30.5 g/dL (ref 30.0–36.0)
MCV: 95.5 fL (ref 80.0–100.0)
Platelets: 180 10*3/uL (ref 150–400)
RBC: 2.92 MIL/uL — ABNORMAL LOW (ref 3.87–5.11)
RDW: 13.5 % (ref 11.5–15.5)
WBC: 10.5 10*3/uL (ref 4.0–10.5)
nRBC: 0.2 % (ref 0.0–0.2)

## 2020-01-06 MED ORDER — POLYETHYLENE GLYCOL 3350 17 G PO PACK
17.0000 g | PACK | Freq: Two times a day (BID) | ORAL | Status: DC
  Administered 2020-01-06 – 2020-01-10 (×7): 17 g via ORAL
  Filled 2020-01-06 (×8): qty 1

## 2020-01-06 MED ORDER — BISACODYL 5 MG PO TBEC
5.0000 mg | DELAYED_RELEASE_TABLET | Freq: Once | ORAL | Status: AC
  Administered 2020-01-06: 5 mg via ORAL
  Filled 2020-01-06: qty 1

## 2020-01-06 MED ORDER — METHOCARBAMOL 500 MG PO TABS: 500.0000 mg | ORAL_TABLET | Freq: Four times a day (QID) | ORAL | 0 refills | Status: DC | PRN

## 2020-01-06 MED ORDER — HYDROCODONE-ACETAMINOPHEN 5-325 MG PO TABS: 1.0000 | ORAL_TABLET | ORAL | 0 refills | Status: DC | PRN

## 2020-01-06 MED ORDER — ENOXAPARIN SODIUM 30 MG/0.3ML ~~LOC~~ SOLN: 30.0000 mg | SUBCUTANEOUS | 0 refills | Status: DC

## 2020-01-06 NOTE — TOC CAGE-AID Note (Signed)
Transition of Care [facility]) - CAGE-AID Screening   Patient Details  Name: Laura Chambers MRN: [id number] Date of Birth: May 03, 1941  Transition of Care [facility]) CM/SW Contact:    Emeterio Reeve, [place] Phone Number: 01/06/2020, 11:13 AM   Clinical Narrative:  CSW met with pt at bedside. CSW introduced self and explained her role at the hospital. Pt denied alcohol use and substance use.   CAGE-AID Screening:    Have You Ever Felt You Ought to Cut Down on Your Drinking or Drug Use?: No Have People Annoyed You By Critizing Your Drinking Or Drug Use?: No Have You Felt Bad Or Guilty About Your Drinking Or Drug Use?: No Have You Ever Had a Drink or Used Drugs First Thing In The Morning to STeady Your Nerves or to Get Rid of a Hangover?: No CAGE-AID Score: 0  Substance Abuse Education Offered: No     Blima Ledger, [place] Social Worker [phone]

## 2020-01-06 NOTE — Progress Notes (Signed)
PROGRESS NOTE    Laura Chambers  FMB:[id number] DOB: 1941-03-25 DOA: 01/03/2020 PCP: Leamon Arnt, MD   Brief Narrative: 79 year old with past medical history significant for rheumatoid arthritis, hypertension, hyperlipidemia presented to the ER after a mechanical fall.  Patient states that she has a left foot drop and recently was diagnosed with problems with her right foot arch by her rheumatologist.  She walks with help of her walker.  She slipped and fell.  X-ray in the ED shows features concerning for right periprosthetic knee fracture.  Patient underwent open reduction and internal fixation of right distal femoral fracture by Dr. Doreatha Martin on 6/30.  Assessment & Plan:   Principal Problem:   Supracondylar fracture of right femur (HCC) Active Problems:   Dyslipidemia   Essential hypertension   Obesity, Class III, BMI 40-49.9 (morbid obesity) (HCC)   Rheumatoid arthritis involving multiple sites with positive rheumatoid factor (HCC)   Femur fracture, right (HCC)   1-Right knee periprostatic fracture: Orthopedic consulted and following Patient underwent open reduction and internal fixation of right distal femoral fracture by Dr. Doreatha Martin on 6/30 PT OT per surgery DVT prophylaxis: Lovenox Received antibiotics prophylaxis Pain controlled.   2-Hypertension: Continue with atenolol.  Hold Norvasc due to soft blood pressure to avoid further worsening of renal function  3-Hyperlipidemia: Continue with statins  4-Rheumatoid arthritis: per orhto hold plaquenil for 4 weeks. Post surgery   5-AKI on CKD stage IIIb: Creatinine baseline 1.5. Worsening renal function post surgery , suspect related To hypovolemia hypotension. Monitor urine output, bladder scan. Back to baseline. Stop IV fluids.   6-Hyperkalemia; Patient was getting IV fluids with potassium supplementation.  I have changed fluids to normal saline. EKG without peak T wave.  Received a dose of  Lokelma resolved  7-Acute blood loss anemia, post surgery expected.  Hemoglobin trending down today at 8.  Stop fluids . Asymptomatic. No indication for transfusion at this time 8-Leukocytosis: Secondary to acute trauma: Resolved 9-acute hypoxic respiratory failure: Plus surgery requiring 2 L of oxygen. Incentive spirometry Careful with sedatives. Off oxygen   Nutrition Problem: Increased nutrient needs Etiology: post-op healing    Signs/Symptoms: estimated needs    Interventions: MVI, Ensure Enlive (each supplement provides 350kcal and 20 grams of protein)  Estimated body mass index is 31.32 kg/m as calculated from the following:   Height as of this encounter: 5\' 7"  (1.702 m).   Weight as of this encounter: 90.7 kg.   DVT prophylaxis: Lovenox Code Status: Full code Family Communication: Care discussed with patient Disposition Plan:  Status is: Inpatient  Remains inpatient appropriate because:IV treatments appropriate due to intensity of illness or inability to take PO   Dispo: The patient is from: Home              Anticipated d/c is to: Home              Anticipated d/c date is: 2 days              Patient currently is not medically stable to d/c.  Plan to repeat hb in am to document stability . Discharge 7/03 if hb stable.       Consultants:   Orthopedic  Procedures:  Patient underwent open reduction and internal fixation of right distal femoral fracture by Dr. Doreatha Martin on 6/30  Antimicrobials:    Subjective: Pain controlled. No BM yet.  Does not wants to go to SNF Objective: Vitals:   01/05/20 2300 01/06/20 0343 01/06/20 0807 01/06/20  1240  BP:  (!) 111/45 (!) 108/49 (!) 113/45  Pulse:  63 67 71  Resp:  15 17 18   Temp:  (!) 97.4 F (36.3 C) 98.9 F (37.2 C) 98.1 F (36.7 C)  TempSrc:  Oral Oral Oral  SpO2: 92% (!) 85% 96% 90%  Weight:      Height:        Intake/Output Summary (Last 24 hours) at 01/06/2020 1603 Last data filed at 01/06/2020  0600 Gross per 24 hour  Intake 1084.78 ml  Output 1100 ml  Net -15.22 ml   Filed Weights   01/04/20 1301  Weight: 90.7 kg    Examination:  General exam: NAD Respiratory system: CTA Cardiovascular system: S 1 S 2 RRR. Gastrointestinal system: BS present, soft, nt Central nervous system: non focal.  Extremities: Right LE incision clean  Data Reviewed: I have personally reviewed following labs and imaging studies  CBC: Recent Labs  Lab 01/03/20 2056 01/04/20 0439 01/04/20 2039 01/05/20 0342 01/06/20 0844  WBC 18.4* 9.8 13.4* 9.5 10.5  NEUTROABS 16.1*  --   --   --   --   HGB 12.6 10.9* 11.1* 10.0* 8.5*  HCT 40.3 34.2* 36.0 33.3* 27.9*  MCV 92.6 91.2 93.3 95.7 95.5  PLT 240 177 212 192 283   Basic Metabolic Panel: Recent Labs  Lab 01/03/20 2056 01/03/20 2056 01/04/20 0439 01/04/20 2039 01/05/20 0342 01/05/20 1146 01/06/20 0844  NA 143  --  141  --  140 137 137  K 4.5  --  4.6  --  5.7* 4.6 4.5  CL 109  --  109  --  106 105 107  CO2 24  --  24  --  25 25 24   GLUCOSE 157*  --  141*  --  147* 129* 92  BUN 34*  --  32*  --  31* 32* 30*  CREATININE 1.75*   < > 1.45* 1.58* 2.03* 1.91* 1.59*  CALCIUM 9.4  --  8.6*  --  8.5* 7.8* 8.1*   < > = values in this interval not displayed.   GFR: Estimated Creatinine Clearance: 33.2 mL/min (A) (by C-G formula based on SCr of 1.59 mg/dL (H)). Liver Function Tests: No results for input(s): AST, ALT, ALKPHOS, BILITOT, PROT, ALBUMIN in the last 168 hours. No results for input(s): LIPASE, AMYLASE in the last 168 hours. No results for input(s): AMMONIA in the last 168 hours. Coagulation Profile: No results for input(s): INR, PROTIME in the last 168 hours. Cardiac Enzymes: No results for input(s): CKTOTAL, CKMB, CKMBINDEX, TROPONINI in the last 168 hours. BNP (last 3 results) No results for input(s): PROBNP in the last 8760 hours. HbA1C: No results for input(s): HGBA1C in the last 72 hours. CBG: No results for input(s):  GLUCAP in the last 168 hours. Lipid Profile: No results for input(s): CHOL, HDL, LDLCALC, TRIG, CHOLHDL, LDLDIRECT in the last 72 hours. Thyroid Function Tests: No results for input(s): TSH, T4TOTAL, FREET4, T3FREE, THYROIDAB in the last 72 hours. Anemia Panel: No results for input(s): VITAMINB12, FOLATE, FERRITIN, TIBC, IRON, RETICCTPCT in the last 72 hours. Sepsis Labs: No results for input(s): PROCALCITON, LATICACIDVEN in the last 168 hours.  Recent Results (from the past 240 hour(s))  SARS Coronavirus 2 by RT PCR (hospital order, performed in [facility] hospital lab) Nasopharyngeal Nasopharyngeal Swab     Status: None   Collection Time: 01/03/20  8:56 PM   Specimen: Nasopharyngeal Swab  Result Value Ref Range Status   SARS  Coronavirus 2 NEGATIVE NEGATIVE Final    Comment: (NOTE) SARS-CoV-2 target nucleic acids are NOT DETECTED.  The SARS-CoV-2 RNA is generally detectable in upper and lower respiratory specimens during the acute phase of infection. The lowest concentration of SARS-CoV-2 viral copies this assay can detect is 250 copies / mL. A negative result does not preclude SARS-CoV-2 infection and should not be used as the sole basis for treatment or other patient management decisions.  A negative result may occur with improper specimen collection / handling, submission of specimen other than nasopharyngeal swab, presence of viral mutation(s) within the areas targeted by this assay, and inadequate number of viral copies (<250 copies / mL). A negative result must be combined with clinical observations, patient history, and epidemiological information.  Fact Sheet for Patients:   StrictlyIdeas.no  Fact Sheet for Healthcare Providers: BankingDealers.co.za  This test is not yet approved or  cleared by the [place] FDA and has been authorized for detection and/or diagnosis of SARS-CoV-2 by FDA under an Emergency Use  Authorization (EUA).  This EUA will remain in effect (meaning this test can be used) for the duration of the COVID-19 declaration under Section 564(b)(1) of the Act, 21 U.S.C. section 360bbb-3(b)(1), unless the authorization is terminated or revoked sooner.  Performed at [facility], [place] [address] [place], [place] [address]          Radiology Studies: DG Knee Right Port  Result Date: 01/04/2020 CLINICAL DATA:  Periprosthetic right femoral fracture, ORIF EXAM: PORTABLE RIGHT KNEE - 1-2 VIEW COMPARISON:  01/04/2020, 01/03/2020 FINDINGS: Frontal and lateral views of the right knee demonstrate lateral plate and screw fixation traversing the comminuted distal right femoral periprosthetic fracture seen previously. The superior margin of the fusion plate is excluded by collimation. Alignment is near anatomic. Right knee is well aligned. Postsurgical changes are seen in the soft tissues. IMPRESSION: 1. ORIF of a periprosthetic right femoral fracture as above. Electronically Signed   By: Randa Ngo M.D.   On: 01/04/2020 20:12   DG C-Arm 1-60 Min  Result Date: 01/04/2020 CLINICAL DATA:  Femur fracture EXAM: RIGHT FEMUR 2 VIEWS; DG C-ARM 1-60 MIN COMPARISON:  01/03/2020 FINDINGS: Nine low resolution intraoperative spot views of the right femur. Total fluoroscopy time was 1 minutes 57 seconds. The images demonstrate a comminuted distal femoral periprosthetic fracture with subsequent surgical plate and multiple screw fixation. Decreased fracture displacement and angulation. IMPRESSION: Intraoperative fluoroscopic assistance provided during surgical fixation of distal femur fracture. Electronically Signed   By: Donavan Foil M.D.   On: 01/04/2020 17:42   DG FEMUR, MIN 2 VIEWS RIGHT  Result Date: 01/04/2020 CLINICAL DATA:  Femur fracture EXAM: RIGHT FEMUR 2 VIEWS; DG C-ARM 1-60 MIN COMPARISON:  01/03/2020 FINDINGS: Nine low resolution intraoperative spot views of the right femur.  Total fluoroscopy time was 1 minutes 57 seconds. The images demonstrate a comminuted distal femoral periprosthetic fracture with subsequent surgical plate and multiple screw fixation. Decreased fracture displacement and angulation. IMPRESSION: Intraoperative fluoroscopic assistance provided during surgical fixation of distal femur fracture. Electronically Signed   By: Donavan Foil M.D.   On: 01/04/2020 17:42        Scheduled Meds: . atenolol  50 mg Oral QHS  . docusate sodium  100 mg Oral BID  . enoxaparin (LOVENOX) injection  30 mg Subcutaneous Q24H  . feeding supplement (ENSURE ENLIVE)  237 mL Oral Q1400  . gabapentin  300 mg Oral BID  . mouth rinse  15 mL Mouth  Rinse BID  . multivitamin with minerals  1 tablet Oral Daily  . polyethylene glycol  17 g Oral Daily  . rosuvastatin  10 mg Oral QHS   Continuous Infusions: . methocarbamol (ROBAXIN) IV       LOS: 3 days    Time spent: 35 minutes.     Elmarie Shiley, MD [facility]   If 7PM-7AM, please contact night-coverage [email or web address]  01/06/2020, 4:03 PM

## 2020-01-06 NOTE — Progress Notes (Addendum)
Orthopaedic Trauma Progress Note  S: Doing okay this morning. Having some heel pain.  Pain medications have been effective.  Had difficult time working with therapy yesterday.  Due to residual foot drop on the left side from previous surgeries, patient notes she normally relies heavily on her right leg when mobilizing.  O:  Vitals:   01/05/20 2300 01/06/20 0343  BP:  (!) 111/45  Pulse:  63  Resp:  15  Temp:  (!) 97.4 F (36.3 C)  SpO2: 92% (!) 85%    General: Sitting up in bed, no acute distress.   Respiratory: No increased work of breathing. Right lower extremity: Dressing removed, incisions are clean, dry, and intact.  Mild knee effusion.  Tenderness with palpation to the distal thigh but nontender through the lower leg, ankle, foot. Do not appreciate any redness around the heel. No pain with palpation of this area. Patient has neuropathy at baseline but does endorse sensation to light touch intermittently throughout the lower extremity.  Able to wiggle each of her toes.  Ankle dorsiflexion plantarflexion is intact.  Extremity warm.+ DP pulse  Imaging: Stable post op imaging.   Labs:  Results for orders placed or performed during the hospital encounter of 01/03/20 (from the past 24 hour(s))  Basic metabolic panel     Status: Abnormal   Collection Time: 01/05/20 11:46 AM  Result Value Ref Range   Sodium 137 135 - 145 mmol/L   Potassium 4.6 3.5 - 5.1 mmol/L   Chloride 105 98 - 111 mmol/L   CO2 25 22 - 32 mmol/L   Glucose, Bld 129 (H) 70 - 99 mg/dL   BUN 32 (H) 8 - 23 mg/dL   Creatinine, Ser 1.91 (H) 0.44 - 1.00 mg/dL   Calcium 7.8 (L) 8.9 - 10.3 mg/dL   GFR calc non Af Amer 24 (L) >60 mL/min   GFR calc Af Amer 28 (L) >60 mL/min   Anion gap 7 5 - 15  Urinalysis, Routine w reflex microscopic     Status: None   Collection Time: 01/05/20  4:40 PM  Result Value Ref Range   Color, Urine YELLOW YELLOW   APPearance CLEAR CLEAR   Specific Gravity, Urine 1.013 1.005 - 1.030   pH 5.0  5.0 - 8.0   Glucose, UA NEGATIVE NEGATIVE mg/dL   Hgb urine dipstick NEGATIVE NEGATIVE   Bilirubin Urine NEGATIVE NEGATIVE   Ketones, ur NEGATIVE NEGATIVE mg/dL   Protein, ur NEGATIVE NEGATIVE mg/dL   Nitrite NEGATIVE NEGATIVE   Leukocytes,Ua NEGATIVE NEGATIVE    Assessment: 79 year old female status post fall, 2 Days Post-Op   Injuries: Right periprosthetic distal femur fracture status post ORIF  Weightbearing: TDWB RLE  Insicional and dressing care: Okay to leave open to air  Showering: Okay to begin showering with assistance on 01/08/2020  Orthopedic device(s): None   CV/Blood loss: Acute blood loss anemia, Hgb 10.0 yesterday morning.  CBC pending this morning   Pain management:  1. Tylenol 325-650 mg q 6 hours PRN 2. Robaxin 500 mg q 6 hours PRN 3. Norco 5-325 mg q 4 hours PRN 4. Neurontin 300 mg BID 5. Morphine 0.5-1 mg q 2 hours PRN  VTE prophylaxis: Lovenox, SCDs  ID: [id number] 2gm post op completed  Foley/Lines: No foley, KVO IVFs  Medical co-morbidities: Rheumatoid arthritis, hypertension, hyperlipidemia  Impediments to Fracture Healing: Vitamin D level looks good at 35, no need for supplementation  Dispo: Up with therapies as tolerated. Plan to hold patient Plaquenil for  approximately 4 weeks.  Patient states she does not want to go to SNF, would prefer home with home health if possible. HH PT/OT has been set up to start on 01/11/20.  Would like patient to be mobilizing better before okay for discharge, not ready at this time  Follow - up plan: 2 weeks  Contact information:  Katha Hamming MD, Patrecia Pace PA-C   [facility]add[facility] Manes A. [facility] [phone] (office) [email or web address]

## 2020-01-06 NOTE — Progress Notes (Signed)
Physical Therapy Treatment Patient Details Name: Laura Chambers MRN: [id number] DOB: [birth year] Today's Date: 01/06/2020    History of Present Illness 79 y.o. female with R periprosthetic distal femur fx due to fall on 01/03/20. S.p ORIF femur 01/04/20. PMH L foot drop, osteoporosis, OA, neuropathy, HTN, HLD, anxiety    PT Comments    Pt presents in bed upon arrival. Pt required up to max(A) for bed mobility, total (A) +2 for sit to stand, lateral scoot and squat pivot transfer to chair. Pt reports fear and anxiety with moving stating "please don't drop me". After the transfer pt reported "I think I did better today". Pt educated on healing process, times and importance of mobility within the healing process. Discussed d/c to SNF today, pt more agreeable, would rather go home but reports "I don't know how I am going to get out of bed and use the bathroom at home" and acknowledges that she will need to go to rehab. Recommend maxi move lift for transfers with nursing staff. Will continue to follow acutely.    Follow Up Recommendations  SNF     Equipment Recommendations  Wheelchair cushion (measurements PT);Wheelchair (measurements PT);3in1 (PT);Rolling walker with 5" wheels    Recommendations for Other Services       Precautions / Restrictions Precautions Precautions: Fall Restrictions Weight Bearing Restrictions: Yes RLE Weight Bearing: Touchdown weight bearing    Mobility  Bed Mobility Overal bed mobility: Needs Assistance Bed Mobility: Rolling;Sidelying to Sit Rolling: Min guard Sidelying to sit: Max assist;HOB elevated       General bed mobility comments: Assistance for trunk and LE  Transfers Overall transfer level: Needs assistance   Transfers: Squat Pivot Transfers;Lateral/Scoot Transfers;Sit to/from Stand Sit to Stand: Total assist;+2 physical assistance   Squat pivot transfers: Total assist;+2 physical assistance    Lateral/Scoot Transfers: Total assist;+2  physical assistance General transfer comment: Pt sit to stand from elevated EOB with cues for hand placement and WB precautions total(A) +2 to adjust pad for squat pivot transfer. Lateral scoot transfer +2 assistance to head of bed x1, squat pivot to chair +2 total assistance x2. Pt able to scoot up in chair x3 to adjust torso with min(A) for LE support  Ambulation/Gait             General Gait Details: unable   Stairs             Wheelchair Mobility    Modified Rankin (Stroke Patients Only)       Balance Overall balance assessment: Needs assistance Sitting-balance support: Bilateral upper extremity supported;Feet supported Sitting balance-Leahy Scale: Poor Sitting balance - Comments: Initially required external assist to maintain upright position seated EOB. Progressed to supervisioon     Standing balance-Leahy Scale: Zero Standing balance comment: Heavy realiance on BUE and external assist, unable to stand. Had to squat pivot over to chair.                            Cognition Arousal/Alertness: Awake/alert Behavior During Therapy: WFL for tasks assessed/performed Overall Cognitive Status: Within Functional Limits for tasks assessed                                 General Comments: Demonstrated increased fear and anxiety with mobility, limiting mobility completed during the session. Pt tends to fixate on her RLE being her main WB leg and being unable  to transfer because of this.      Exercises      General Comments General comments (skin integrity, edema, etc.): BP orthostatic 119/55 symptomatic sitting EOB, 97/45 after transfer.      Pertinent Vitals/Pain Pain Assessment: Faces Faces Pain Scale: Hurts worst Pain Location: R knee with movement Pain Descriptors / Indicators: Constant;Aching;Grimacing;Other (Comment) (pulling) Pain Intervention(s): Limited activity within patient's tolerance;Monitored during session;Repositioned;Ice  applied    Home Living                      Prior Function            PT Goals (current goals can now be found in the care plan section) Acute Rehab PT Goals Patient Stated Goal: To get stronger.  PT Goal Formulation: With patient Time For Goal Achievement: 01/19/20 Potential to Achieve Goals: Good Progress towards PT goals: Progressing toward goals    Frequency    Min 3X/week      PT Plan Discharge plan needs to be updated    Co-evaluation              AM-PAC PT "6 Clicks" Mobility   Outcome Measure  Help needed turning from your back to your side while in a flat bed without using bedrails?: None Help needed moving from lying on your back to sitting on the side of a flat bed without using bedrails?: Total Help needed moving to and from a bed to a chair (including a wheelchair)?: Total Help needed standing up from a chair using your arms (e.g., wheelchair or bedside chair)?: Total Help needed to walk in hospital room?: Total Help needed climbing 3-5 steps with a railing? : Total 6 Click Score: 9    End of Session Equipment Utilized During Treatment: Gait belt Activity Tolerance: Patient limited by pain;Other (comment) (limited by fear and anxiety) Patient left: with call bell/phone within reach;in chair;with chair alarm set;Other (comment) (lift pad underneath pt) Nurse Communication: Mobility status;Need for lift equipment PT Visit Diagnosis: Unsteadiness on feet (R26.81);Other abnormalities of gait and mobility (R26.89);Muscle weakness (generalized) (M62.81);Other symptoms and signs involving the nervous system (R29.898);Pain Pain - Right/Left: Right Pain - part of body: Leg     Time: 1125-1203 PT Time Calculation (min) (ACUTE ONLY): 38 min  Charges:  $Therapeutic Activity: 38-52 mins                     [employer] SPT 01/06/2020    Rolland Porter 01/06/2020, 12:46 PM

## 2020-01-06 NOTE — Discharge Instructions (Signed)
Orthopaedic Trauma Service Discharge Instructions   General Discharge Instructions  WEIGHT BEARING STATUS: Touchdown weightbearing right lower extremity  RANGE OF MOTION/ACTIVITY: Okay for knee range of motion as tolerated  Wound Care: Incisions can be left open to air if there is no drainage. The skin glue (Dermabond) will fall off on its own over the next several weeks. If incision continues to have drainage, follow wound care instructions below. Okay to shower if no drainage from incisions.  DVT/PE prophylaxis: Lovenox x 30 days  Diet: as you were eating previously.  Can use over the counter stool softeners and bowel preparations, such as Miralax, to help with bowel movements.  Narcotics can be constipating.  Be sure to drink plenty of fluids  PAIN MEDICATION USE AND EXPECTATIONS  You have likely been given narcotic medications to help control your pain.  After a traumatic event that results in an fracture (broken bone) with or without surgery, it is ok to use narcotic pain medications to help control one's pain.  We understand that everyone responds to pain differently and each individual patient will be evaluated on a regular basis for the continued need for narcotic medications. Ideally, narcotic medication use should last no more than 6-8 weeks (coinciding with fracture healing).   As a patient it is your responsibility as well to monitor narcotic medication use and report the amount and frequency you use these medications when you come to your office visit.   We would also advise that if you are using narcotic medications, you should take a dose prior to therapy to maximize you participation.  IF YOU ARE ON NARCOTIC MEDICATIONS IT IS NOT PERMISSIBLE TO OPERATE A MOTOR VEHICLE (MOTORCYCLE/CAR/TRUCK/MOPED) OR HEAVY MACHINERY DO NOT MIX NARCOTICS WITH OTHER CNS (CENTRAL NERVOUS SYSTEM) DEPRESSANTS SUCH AS ALCOHOL   STOP SMOKING OR USING NICOTINE PRODUCTS!!!!  As discussed nicotine  severely impairs your body's ability to heal surgical and traumatic wounds but also impairs bone healing.  Wounds and bone heal by forming microscopic blood vessels (angiogenesis) and nicotine is a vasoconstrictor (essentially, shrinks blood vessels).  Therefore, if vasoconstriction occurs to these microscopic blood vessels they essentially disappear and are unable to deliver necessary nutrients to the healing tissue.  This is one modifiable factor that you can do to dramatically increase your chances of healing your injury.    (This means no smoking, no nicotine gum, patches, etc)  DO NOT USE NONSTEROIDAL ANTI-INFLAMMATORY DRUGS (NSAID'S)  Using products such as Advil (ibuprofen), Aleve (naproxen), Motrin (ibuprofen) for additional pain control during fracture healing can delay and/or prevent the healing response.  If you would like to take over the counter (OTC) medication, Tylenol (acetaminophen) is ok.  However, some narcotic medications that are given for pain control contain acetaminophen as well. Therefore, you should not exceed more than 4000 mg of tylenol in a day if you do not have liver disease.  Also note that there are may OTC medicines, such as cold medicines and allergy medicines that my contain tylenol as well.  If you have any questions about medications and/or interactions please ask your doctor/PA or your pharmacist.      ICE AND ELEVATE INJURED/OPERATIVE EXTREMITY  Using ice and elevating the injured extremity above your heart can help with swelling and pain control.  Icing in a pulsatile fashion, such as 20 minutes on and 20 minutes off, can be followed.    Do not place ice directly on skin. Make sure there is a barrier between  to skin and the ice pack.    Using frozen items such as frozen peas works well as the conform nicely to the are that needs to be iced.  USE AN ACE WRAP OR TED HOSE FOR SWELLING CONTROL  In addition to icing and elevation, Ace wraps or TED hose are used to  help limit and resolve swelling.  It is recommended to use Ace wraps or TED hose until you are informed to stop.    When using Ace Wraps start the wrapping distally (farthest away from the body) and wrap proximally (closer to the body)   Example: If you had surgery on your leg or thing and you do not have a splint on, start the ace wrap at the toes and work your way up to the thigh        If you had surgery on your upper extremity and do not have a splint on, start the ace wrap at your fingers and work your way up to the upper arm   [place]: [phone]   VISIT OUR WEBSITE FOR ADDITIONAL INFORMATION: [email or web address]     Discharge Wound Care Instructions  Do NOT apply any ointments, solutions or lotions to pin sites or surgical wounds.  These prevent needed drainage and even though solutions like hydrogen peroxide kill bacteria, they also damage cells lining the pin sites that help fight infection.  Applying lotions or ointments can keep the wounds moist and can cause them to breakdown and open up as well. This can increase the risk for infection. When in doubt call the office.  Surgical incisions should be dressed daily.  If any drainage is noted, use one layer of adaptic, then gauze, Kerlix, and an ace wrap.  Once the incision is completely dry and without drainage, it may be left open to air out.  Showering may begin 36-48 hours later.  Cleaning gently with soap and water.  Traumatic wounds should be dressed daily as well.    One layer of adaptic, gauze, Kerlix, then ace wrap.  The adaptic can be discontinued once the draining has ceased    If you have a wet to dry dressing: wet the gauze with saline the squeeze as much saline out so the gauze is moist (not soaking wet), place moistened gauze over wound, then place a dry gauze over the moist one, followed by Kerlix wrap, then ace wrap.

## 2020-01-07 DIAGNOSIS — D62 Acute posthemorrhagic anemia: Secondary | ICD-10-CM

## 2020-01-07 LAB — CBC
HCT: 28.5 % — ABNORMAL LOW (ref 36.0–46.0)
Hemoglobin: 8.6 g/dL — ABNORMAL LOW (ref 12.0–15.0)
MCH: 28.9 pg (ref 26.0–34.0)
MCHC: 30.2 g/dL (ref 30.0–36.0)
MCV: 95.6 fL (ref 80.0–100.0)
Platelets: 195 10*3/uL (ref 150–400)
RBC: 2.98 MIL/uL — ABNORMAL LOW (ref 3.87–5.11)
RDW: 13.6 % (ref 11.5–15.5)
WBC: 10.2 10*3/uL (ref 4.0–10.5)
nRBC: 0 % (ref 0.0–0.2)

## 2020-01-07 LAB — BASIC METABOLIC PANEL
Anion gap: 7 (ref 5–15)
BUN: 28 mg/dL — ABNORMAL HIGH (ref 8–23)
CO2: 22 mmol/L (ref 22–32)
Calcium: 8.2 mg/dL — ABNORMAL LOW (ref 8.9–10.3)
Chloride: 108 mmol/L (ref 98–111)
Creatinine, Ser: 1.41 mg/dL — ABNORMAL HIGH (ref 0.44–1.00)
GFR calc Af Amer: 41 mL/min — ABNORMAL LOW (ref >60)
GFR calc non Af Amer: 35 mL/min — ABNORMAL LOW (ref >60)
Glucose, Bld: 101 mg/dL — ABNORMAL HIGH (ref 70–99)
Potassium: 4.8 mmol/L (ref 3.5–5.1)
Sodium: 137 mmol/L (ref 135–145)

## 2020-01-07 MED ORDER — FERROUS SULFATE 325 (65 FE) MG PO TABS
325.0000 mg | ORAL_TABLET | Freq: Every day | ORAL | Status: DC
  Administered 2020-01-07 – 2020-01-11 (×5): 325 mg via ORAL
  Filled 2020-01-07 (×4): qty 1

## 2020-01-07 MED ORDER — FLEET ENEMA 7-19 GM/118ML RE ENEM
1.0000 | ENEMA | Freq: Once | RECTAL | Status: AC
  Administered 2020-01-07: 1 via RECTAL
  Filled 2020-01-07: qty 1

## 2020-01-07 NOTE — NC FL2 (Signed)
[place] LEVEL OF CARE SCREENING TOOL     IDENTIFICATION  Patient Name: Laura Chambers Birthdate: [birth year] Sex: female Admission Date (Current Location): 01/03/2020  [facility] and [place] Number:  [employer] and Address:  [facility], [place] [address], [place], [place] [address]      Provider Number: [id number]  Attending Physician Name and Address:  Elmarie Shiley, MD  Relative Name and Phone Number:  Leroy Sea, son , [phone]    Current Level of Care: Hospital Recommended Level of Care: [place] Prior Approval Number:    Date Approved/Denied:   PASRR Number: [id number]  Discharge Plan: SNF    Current Diagnoses: Patient Active Problem List   Diagnosis Date Noted  . Acute blood loss anemia 01/06/2020  . Supracondylar fracture of right femur ([place]) 01/03/2020  . Femur fracture, right ([place]) 01/03/2020  . 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 Rheumatoid arthritis involving multiple sites with positive rheumatoid factor ([place]) 10/13/2018  . Hx of colonic polyps 12/15/2017  . 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 Fibromyalgia 12/15/2017  . History of total right hip replacement 12/15/2017  . DDD (degenerative disc disease), lumbar 12/15/2017  . 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 Left foot drop 12/15/2017  . 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 Status post revision of total knee, left 06/09/2017  . Obesity, Class III, BMI 40-49.9 (morbid obesity) ([place]) 12/28/2016  . Essential hypertension 12/08/2016  . Peripheral neuropathy 01/12/2014  . 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 Dyslipidemia 06/16/2012    Orientation RESPIRATION BLADDER Height & Weight     Self, Time, Situation, Place  Normal Continent Weight:  200 lb (90.7 kg) Height:  5\' 7"  (170.2 cm)  BEHAVIORAL SYMPTOMS/MOOD NEUROLOGICAL BOWEL NUTRITION STATUS      Continent Diet (Please see DC Summary)  AMBULATORY STATUS COMMUNICATION OF NEEDS Skin   Limited Assist Verbally Surgical wounds (Closed incision on leg)                       Personal Care Assistance Level of Assistance  Bathing, Feeding, Dressing Bathing Assistance: Limited assistance Feeding assistance: Independent Dressing Assistance: Limited assistance     Functional Limitations Info  Sight, Hearing, Speech Sight Info: Adequate Hearing Info: Adequate Speech Info: Adequate    SPECIAL CARE FACTORS FREQUENCY  PT (By licensed PT), OT (By licensed OT)     PT Frequency: 5x/week OT Frequency: 5x/week            Contractures Contractures Info: Not present    Additional Factors Info  Code Status, Allergies Code Status Info: Full Allergies Info: Ativan (Lorazepam), Haldol (Haloperidol), Epinephrine, Levofloxacin, Lipitor (Atorvastatin), Trovan (Alatrofloxacin)           Current Medications (01/07/2020):  This is the current hospital active medication list Current Facility-Administered Medications  Medication Dose Route Frequency Provider Last Rate Last Admin  . acetaminophen (TYLENOL) tablet 325-650 mg  325-650 mg Oral Q6H PRN Delray Alt, PA-C   650 mg at 01/07/20 0340  . atenolol (TENORMIN) tablet 50 mg  50 mg Oral QHS Patrecia Pace A, PA-C   50 mg at 01/06/20 2148  . docusate sodium (COLACE) capsule 100 mg  100 mg Oral BID Patrecia Pace A, PA-C   100 mg at 01/06/20 2140  . enoxaparin (LOVENOX)  injection 30 mg  30 mg Subcutaneous Q24H Karren Cobble, [place]   30 mg at 01/07/20 4665  . feeding supplement (ENSURE ENLIVE) (ENSURE ENLIVE) liquid 237 mL  237 mL Oral Q1400 Patrecia Pace A, PA-C      . gabapentin (NEURONTIN) capsule 300 mg  300 mg Oral BID Patrecia Pace A, PA-C   300 mg at 01/06/20 2140  . HYDROcodone-acetaminophen (NORCO/VICODIN) 5-325 MG  per tablet 1-2 tablet  1-2 tablet Oral Q4H PRN Regalado, Belkys A, MD   1 tablet at 01/06/20 1831  . MEDLINE mouth rinse  15 mL Mouth Rinse BID Little Ishikawa, MD   15 mL at 01/06/20 2142  . methocarbamol (ROBAXIN) tablet 500 mg  500 mg Oral Q6H PRN Delray Alt, PA-C   500 mg at 01/05/20 0034   Or  . methocarbamol (ROBAXIN) 500 mg in dextrose 5 % 50 mL IVPB  500 mg Intravenous Q6H PRN Delray Alt, PA-C      . metoCLOPramide (REGLAN) tablet 5-10 mg  5-10 mg Oral Q8H PRN Patrecia Pace A, PA-C       Or  . metoCLOPramide (REGLAN) injection 5-10 mg  5-10 mg Intravenous Q8H PRN Patrecia Pace A, PA-C      . morphine 2 MG/ML injection 0.5 mg  0.5 mg Intravenous Q4H PRN Regalado, Belkys A, MD      . multivitamin with minerals tablet 1 tablet  1 tablet Oral Daily Delray Alt, PA-C   1 tablet at 01/06/20 0926  . ondansetron (ZOFRAN) tablet 4 mg  4 mg Oral Q6H PRN Patrecia Pace A, PA-C       Or  . ondansetron [facility]) injection 4 mg  4 mg Intravenous Q6H PRN Patrecia Pace A, PA-C   4 mg at 01/06/20 1239  . polyethylene glycol (MIRALAX / GLYCOLAX) packet 17 g  17 g Oral Daily PRN Patrecia Pace A, PA-C      . polyethylene glycol (MIRALAX / GLYCOLAX) packet 17 g  17 g Oral BID Regalado, Belkys A, MD   17 g at 01/06/20 2140  . rosuvastatin (CRESTOR) tablet 10 mg  10 mg Oral QHS Patrecia Pace A, PA-C   10 mg at 01/06/20 2140  . simethicone (MYLICON) chewable tablet 80 mg  80 mg Oral QID PRN Delray Alt, PA-C   80 mg at 01/04/20 1215     Discharge Medications: Please see discharge summary for a list of discharge medications.  Relevant Imaging Results:  Relevant Lab Results:   Additional Information SS # [id number]  Benard Halsted, LCSW

## 2020-01-07 NOTE — Progress Notes (Signed)
PROGRESS NOTE    Laura Chambers  BDZ:[id number] DOB: 29-Jan-1941 DOA: 01/03/2020 PCP: Leamon Arnt, MD   Brief Narrative: 79 year old with past medical history significant for rheumatoid arthritis, hypertension, hyperlipidemia presented to the ER after a mechanical fall.  Patient states that she has a left foot drop and recently was diagnosed with problems with her right foot arch by her rheumatologist.  She walks with help of her walker.  She slipped and fell.  X-ray in the ED shows features concerning for right periprosthetic knee fracture.  Patient underwent open reduction and internal fixation of right distal femoral fracture by Dr. Doreatha Martin on 6/30.  Assessment & Plan:   Principal Problem:   Supracondylar fracture of right femur (HCC) Active Problems:   Dyslipidemia   Essential hypertension   Obesity, Class III, BMI 40-49.9 (morbid obesity) (HCC)   Rheumatoid arthritis involving multiple sites with positive rheumatoid factor (HCC)   Femur fracture, right (HCC)   Acute blood loss anemia   1-Right knee periprostatic fracture: Orthopedic consulted and following Patient underwent open reduction and internal fixation of right distal femoral fracture by Dr. Doreatha Martin on 6/30 PT OT per surgery DVT prophylaxis: Lovenox Received antibiotics prophylaxis Pain controlled.  Agree to go to Rehab.   2-Hypertension: Continue with atenolol.  Hold Norvasc due to soft blood pressure to avoid further worsening of renal function  3-Hyperlipidemia: Continue with statins  4-Rheumatoid arthritis: per orhto hold plaquenil for 4 weeks. Post surgery   5-AKI on CKD stage IIIb: Creatinine baseline 1.5. Worsening renal function post surgery , suspect related To hypovolemia hypotension. Monitor urine output, bladder scan. Back to baseline. Stop IV fluids.   6-Hyperkalemia; Patient was getting IV fluids with potassium supplementation.  I have changed fluids to normal saline. EKG without peak T  wave.  Received a dose of Lokelma resolved  7-Acute blood loss anemia, post surgery expected.  Hemoglobin trending down today at 8.  Stop fluids . Asymptomatic. No indication for transfusion at this time Hb stable.   8-Leukocytosis: Secondary to acute trauma: Resolved 9-acute hypoxic respiratory failure: Plus surgery requiring 2 L of oxygen. Incentive spirometry Careful with sedatives. On 2 L.   10-Constipation; didn't tolerated dulcolax. Will try fleet enema today     Nutrition Problem: Increased nutrient needs Etiology: post-op healing    Signs/Symptoms: estimated needs    Interventions: MVI, Ensure Enlive (each supplement provides 350kcal and 20 grams of protein)  Estimated body mass index is 31.32 kg/m as calculated from the following:   Height as of this encounter: 5\' 7"  (1.702 m).   Weight as of this encounter: 90.7 kg.   DVT prophylaxis: Lovenox Code Status: Full code Family Communication: Care discussed with patient and son who was at bedside.  Disposition Plan:  Status is: Inpatient  Remains inpatient appropriate because: Awaiting SNF   Dispo: The patient is from: Home              Anticipated d/c is to: SNF              Anticipated d/c date is: 2 days              Patient currently is medically stable to d/c. awaiting SNF      Consultants:   Orthopedic  Procedures:  Patient underwent open reduction and internal fixation of right distal femoral fracture by Dr. Doreatha Martin on 6/30  Antimicrobials:    Subjective: She was very sick last night after dulcolax.  Report poor oral intake  due to constipation.    Objective: Vitals:   01/06/20 2147 01/07/20 0310 01/07/20 0315 01/07/20 0319  BP: (!) 115/45 (!) 116/57 (!) 103/49 (!) 115/49  Pulse: 72 79 75 77  Resp:  16 15 16   Temp:  98.3 F (36.8 C) 98.5 F (36.9 C) 99.4 F (37.4 C)  TempSrc:  Oral Oral Oral  SpO2:  (!) 81% (!) 86% 92%  Weight:      Height:        Intake/Output Summary (Last  24 hours) at 01/07/2020 0948 Last data filed at 01/07/2020 0500 Gross per 24 hour  Intake --  Output 1200 ml  Net -1200 ml   Filed Weights   01/04/20 1301  Weight: 90.7 kg    Examination:  General exam: NAD Respiratory system: CTA Cardiovascular system: S 1, S 2 RRR. Gastrointestinal system: BS present, soft nt Central nervous system: Non focal.  Extremities: Right LE incision clean  Data Reviewed: I have personally reviewed following labs and imaging studies  CBC: Recent Labs  Lab 01/03/20 2056 01/03/20 2056 01/04/20 0439 01/04/20 2039 01/05/20 0342 01/06/20 0844 01/07/20 0324  WBC 18.4*   < > 9.8 13.4* 9.5 10.5 10.2  NEUTROABS 16.1*  --   --   --   --   --   --   HGB 12.6   < > 10.9* 11.1* 10.0* 8.5* 8.6*  HCT 40.3   < > 34.2* 36.0 33.3* 27.9* 28.5*  MCV 92.6   < > 91.2 93.3 95.7 95.5 95.6  PLT 240   < > 177 212 192 180 195   < > = values in this interval not displayed.   Basic Metabolic Panel: Recent Labs  Lab 01/04/20 0439 01/04/20 0439 01/04/20 2039 01/05/20 0342 01/05/20 1146 01/06/20 0844 01/07/20 0324  NA 141  --   --  140 137 137 137  K 4.6  --   --  5.7* 4.6 4.5 4.8  CL 109  --   --  106 105 107 108  CO2 24  --   --  25 25 24 22   GLUCOSE 141*  --   --  147* 129* 92 101*  BUN 32*  --   --  31* 32* 30* 28*  CREATININE 1.45*   < > 1.58* 2.03* 1.91* 1.59* 1.41*  CALCIUM 8.6*  --   --  8.5* 7.8* 8.1* 8.2*   < > = values in this interval not displayed.   GFR: Estimated Creatinine Clearance: 37.4 mL/min (A) (by C-G formula based on SCr of 1.41 mg/dL (H)). Liver Function Tests: No results for input(s): AST, ALT, ALKPHOS, BILITOT, PROT, ALBUMIN in the last 168 hours. No results for input(s): LIPASE, AMYLASE in the last 168 hours. No results for input(s): AMMONIA in the last 168 hours. Coagulation Profile: No results for input(s): INR, PROTIME in the last 168 hours. Cardiac Enzymes: No results for input(s): CKTOTAL, CKMB, CKMBINDEX, TROPONINI in the  last 168 hours. BNP (last 3 results) No results for input(s): PROBNP in the last 8760 hours. HbA1C: No results for input(s): HGBA1C in the last 72 hours. CBG: No results for input(s): GLUCAP in the last 168 hours. Lipid Profile: No results for input(s): CHOL, HDL, LDLCALC, TRIG, CHOLHDL, LDLDIRECT in the last 72 hours. Thyroid Function Tests: No results for input(s): TSH, T4TOTAL, FREET4, T3FREE, THYROIDAB in the last 72 hours. Anemia Panel: No results for input(s): VITAMINB12, FOLATE, FERRITIN, TIBC, IRON, RETICCTPCT in the last 72 hours. Sepsis Labs: No results  for input(s): PROCALCITON, LATICACIDVEN in the last 168 hours.  Recent Results (from the past 240 hour(s))  SARS Coronavirus 2 by RT PCR (hospital order, performed in [facility] hospital lab) Nasopharyngeal Nasopharyngeal Swab     Status: None   Collection Time: 01/03/20  8:56 PM   Specimen: Nasopharyngeal Swab  Result Value Ref Range Status   SARS Coronavirus 2 NEGATIVE NEGATIVE Final    Comment: (NOTE) SARS-CoV-2 target nucleic acids are NOT DETECTED.  The SARS-CoV-2 RNA is generally detectable in upper and lower respiratory specimens during the acute phase of infection. The lowest concentration of SARS-CoV-2 viral copies this assay can detect is 250 copies / mL. A negative result does not preclude SARS-CoV-2 infection and should not be used as the sole basis for treatment or other patient management decisions.  A negative result may occur with improper specimen collection / handling, submission of specimen other than nasopharyngeal swab, presence of viral mutation(s) within the areas targeted by this assay, and inadequate number of viral copies (<250 copies / mL). A negative result must be combined with clinical observations, patient history, and epidemiological information.  Fact Sheet for Patients:   StrictlyIdeas.no  Fact Sheet for Healthcare  Providers: BankingDealers.co.za  This test is not yet approved or  cleared by the [place] FDA and has been authorized for detection and/or diagnosis of SARS-CoV-2 by FDA under an Emergency Use Authorization (EUA).  This EUA will remain in effect (meaning this test can be used) for the duration of the COVID-19 declaration under Section 564(b)(1) of the Act, 21 U.S.C. section 360bbb-3(b)(1), unless the authorization is terminated or revoked sooner.  Performed at [facility], [place] [address] [place], [place] [address]          Radiology Studies: No results found.      Scheduled Meds:  atenolol  50 mg Oral QHS   docusate sodium  100 mg Oral BID   enoxaparin (LOVENOX) injection  30 mg Subcutaneous Q24H   feeding supplement (ENSURE ENLIVE)  237 mL Oral Q1400   gabapentin  300 mg Oral BID   mouth rinse  15 mL Mouth Rinse BID   multivitamin with minerals  1 tablet Oral Daily   polyethylene glycol  17 g Oral BID   rosuvastatin  10 mg Oral QHS   sodium phosphate  1 enema Rectal Once   Continuous Infusions:  methocarbamol (ROBAXIN) IV       LOS: 4 days    Time spent: 35 minutes.     Elmarie Shiley, MD [facility]   If 7PM-7AM, please contact night-coverage [email or web address]  01/07/2020, 9:48 AM

## 2020-01-07 NOTE — Plan of Care (Signed)

## 2020-01-07 NOTE — TOC Initial Note (Signed)
Transition of Care [facility]) - Initial/Assessment Note    Patient Details  Name: Laura Chambers MRN: [id number] Date of Birth: 1940-11-30  Transition of Care [facility]) CM/SW Contact:    Benard Halsted, LCSW Phone Number: 01/07/2020, 11:35 AM  Clinical Narrative:                 CSW spoke with patient and her son at bedside. Patient is now requesting SNF. CSW sent out referrals for review. [place] is holiday weekend. Patient reports preference for [facility] or [facility] only. [facility] reports not having any beds available. Unable to reach staff at [facility]. Will continue to follow up.   Expected Discharge Plan: [place] Barriers to Discharge: Insurance Authorization, SNF Pending bed offer   Patient Goals and CMS Choice Patient states their goals for this hospitalization and ongoing recovery are:: Rehab CMS Medicare.gov Compare Post Acute Care list provided to:: Patient Choice offered to / list presented to : Patient, Adult Children  Expected Discharge Plan and Services Expected Discharge Plan: [place] In-house Referral: Clinical Social Work Discharge Planning Services: CM Consult Post Acute Care Choice: [place] arrangements for the past 2 months: Single Family Home                 DME Arranged: N/A DME Agency: NA       HH Arranged: PT [place] Agency: [place] (now [facility]) Date [place]: 01/05/20   Representative spoke with at [place]: [place]  Prior Living Arrangements/Services Living arrangements for the past 2 months: [place] Lives with:: Self Patient language and need for interpreter reviewed:: Yes Do you feel safe going back to the place where you live?: Yes      Need for Family Participation in Patient Care: No (Comment) Care giver support system in place?: Yes (comment)   Criminal Activity/Legal Involvement Pertinent to Current  Situation/Hospitalization: No - Comment as needed  Activities of Daily Living Home Assistive Devices/Equipment: Eyeglasses, Environmental consultant (specify type), Wheelchair, Bedside commode/3-in-1 (reading glasses, lift chair) ADL Screening (condition at time of admission) Patient's cognitive ability adequate to safely complete daily activities?: Yes Is the patient deaf or have difficulty hearing?: Yes Does the patient have difficulty seeing, even when wearing glasses/contacts?: Yes (suppose to have left eye cataract surgery) Does the patient have difficulty concentrating, remembering, or making decisions?: No Patient able to express need for assistance with ADLs?: Yes Does the patient have difficulty dressing or bathing?: No Independently performs ADLs?: No Communication: Independent Dressing (OT): Needs assistance Is this a change from baseline?: Change from baseline, expected to last >3 days Grooming: Independent Feeding: Independent Bathing: Needs assistance Is this a change from baseline?: Pre-admission baseline Toileting: Needs assistance Is this a change from baseline?: Pre-admission baseline In/Out Bed: Needs assistance Is this a change from baseline?: Pre-admission baseline Walks in Home: Needs assistance Is this a change from baseline?: Pre-admission baseline Does the patient have difficulty walking or climbing stairs?: Yes Weakness of Legs: Right Weakness of Arms/Hands: None  Permission Sought/Granted Permission sought to share information with : Facility Sport and exercise psychologist, Family Supports Permission granted to share information with : Yes, Verbal Permission Granted  Share Information with NAME: Leroy Sea  Permission granted to share info w AGENCY: SNFs  Permission granted to share info w Relationship: Son  Permission granted to share info w Contact Information: [phone]  Emotional Assessment Appearance:: Appears stated age Attitude/Demeanor/Rapport: Engaged Affect (  typically  observed): Accepting, Appropriate Orientation: : Oriented to Self, Oriented to Place, Oriented to  Time, Oriented to Situation Alcohol / Substance Use: Not Applicable Psych Involvement: No (comment)  Admission diagnosis:  Closed femur fracture ([place]) [S72.90XA] Femur fracture, right ([place]) [S72.91XA] 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, initial encounter [M97.11XA] Patient Active Problem List   Diagnosis Date Noted   Acute blood loss anemia 01/06/2020   Supracondylar fracture of right femur ([place]) 01/03/2020   Femur fracture, right ([place]) 01/03/2020   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    Rheumatoid arthritis involving multiple sites with positive rheumatoid factor ([place]) 10/13/2018   Hx of colonic polyps 12/15/2017   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   Chronic kidney disease (CKD), active medical management without dialysis, stage 3 (moderate) 12/15/2017   Fibromyalgia 12/15/2017   History of total right hip replacement 12/15/2017   DDD (degenerative disc disease), lumbar 12/15/2017   S/P total knee replacement, right 12/15/2017   Left foot drop 12/15/2017   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

## 2020-01-07 NOTE — Plan of Care (Signed)

## 2020-01-08 LAB — CBC
HCT: 27.5 % — ABNORMAL LOW (ref 36.0–46.0)
Hemoglobin: 8.3 g/dL — ABNORMAL LOW (ref 12.0–15.0)
MCH: 28.6 pg (ref 26.0–34.0)
MCHC: 30.2 g/dL (ref 30.0–36.0)
MCV: 94.8 fL (ref 80.0–100.0)
Platelets: 183 10*3/uL (ref 150–400)
RBC: 2.9 MIL/uL — ABNORMAL LOW (ref 3.87–5.11)
RDW: 13.2 % (ref 11.5–15.5)
WBC: 8.3 10*3/uL (ref 4.0–10.5)
nRBC: 0.2 % (ref 0.0–0.2)

## 2020-01-08 NOTE — Plan of Care (Signed)

## 2020-01-08 NOTE — Plan of Care (Signed)

## 2020-01-08 NOTE — Progress Notes (Signed)
PROGRESS NOTE    Laura Chambers  VOZ:[id number] DOB: [birth year] DOA: 01/03/2020 PCP: Leamon Arnt, MD   Brief Narrative: 79 year old with past medical history significant for rheumatoid arthritis, hypertension, hyperlipidemia presented to the ER after a mechanical fall.  Patient states that she has a left foot drop and recently was diagnosed with problems with her right foot arch by her rheumatologist.  She walks with help of her walker.  She slipped and fell.  X-ray in the ED shows features concerning for right periprosthetic knee fracture.  Patient underwent open reduction and internal fixation of right distal femoral fracture by Dr. Doreatha Martin on 6/30.  Assessment & Plan:   Principal Problem:   Supracondylar fracture of right femur (HCC) Active Problems:   Dyslipidemia   Essential hypertension   Obesity, Class III, BMI 40-49.9 (morbid obesity) (HCC)   Rheumatoid arthritis involving multiple sites with positive rheumatoid factor (HCC)   Femur fracture, right (HCC)   Acute blood loss anemia   1-Right knee periprostatic fracture: Orthopedic consulted and following Patient underwent open reduction and internal fixation of right distal femoral fracture by Dr. Doreatha Martin on 6/30 PT OT per surgery DVT prophylaxis: Lovenox Received antibiotics prophylaxis Pain controlled.  Agree to go to Rehab.   2-Hypertension: Continue with atenolol.  Hold Norvasc due to soft blood pressure to avoid further worsening of renal function Well controlled.   3-Hyperlipidemia: Continue with statins  4-Rheumatoid arthritis: per orhto hold plaquenil for 4 weeks. Post surgery   5-AKI on CKD stage IIIb: Creatinine baseline 1.5. Worsening renal function post surgery , suspect related To hypovolemia hypotension. Monitor urine output, bladder scan. Back to baseline. Stop IV fluids.   6-Hyperkalemia; Patient was getting IV fluids with potassium supplementation.  I have changed fluids to normal  saline. EKG without peak T wave.  Received a dose of Lokelma resolved  7-Acute blood loss anemia, post surgery expected.  Hemoglobin trending down today at 8.  Stop fluids . Asymptomatic. No indication for transfusion at this time Hb stable. 8 range. Started iron supplement.   8-Leukocytosis: Secondary to acute trauma: Resolved 9-acute hypoxic respiratory failure: Plus surgery requiring 2 L of oxygen. Incentive spirometry Careful with sedatives. Off oxygen.  Check oxygen sat overnight. Might required oxygen while sleep.   10-Constipation; didn't tolerated dulcolax. Had BM after fleet enema    Nutrition Problem: Increased nutrient needs Etiology: post-op healing    Signs/Symptoms: estimated needs    Interventions: MVI, Ensure Enlive (each supplement provides 350kcal and 20 grams of protein)  Estimated body mass index is 31.32 kg/m as calculated from the following:   Height as of this encounter: 5\' 7"  (1.702 m).   Weight as of this encounter: 90.7 kg.   DVT prophylaxis: Lovenox Code Status: Full code Family Communication: Care discussed with patient and son who was at bedside.  Disposition Plan:  Status is: Inpatient  Remains inpatient appropriate because: Awaiting SNF   Dispo: The patient is from: Home              Anticipated d/c is to: SNF              Anticipated d/c date is: 2 days              Patient currently is medically stable to d/c. awaiting SNF      Consultants:   Orthopedic  Procedures:  Patient underwent open reduction and internal fixation of right distal femoral fracture by Dr. Doreatha Martin on 6/30  Antimicrobials:  Subjective: Complaining of pain. Had hard night last night.  Denies dyspnea   Objective: Vitals:   01/07/20 2141 01/08/20 0341 01/08/20 0750 01/08/20 1315  BP: 129/60 (!) 106/47 (!) 131/54 (!) 116/50  Pulse: 69 64 65 65  Resp:  18 17 16   Temp:  97.6 F (36.4 C) 98.4 F (36.9 C) 97.8 F (36.6 C)  TempSrc:  Oral Oral  Oral  SpO2:  97% 94% 92%  Weight:      Height:        Intake/Output Summary (Last 24 hours) at 01/08/2020 1431 Last data filed at 01/08/2020 0300 Gross per 24 hour  Intake --  Output 1200 ml  Net -1200 ml   Filed Weights   01/04/20 1301  Weight: 90.7 kg    Examination:  General exam: NAD Respiratory system: CTA Cardiovascular system: S 1, S 2 RRR Gastrointestinal system: BS present, soft, nd Central nervous system: Non focal.  Extremities: Right LE incision clean  Data Reviewed: I have personally reviewed following labs and imaging studies  CBC: Recent Labs  Lab 01/03/20 2056 01/04/20 0439 01/04/20 2039 01/05/20 0342 01/06/20 0844 01/07/20 0324 01/08/20 1108  WBC 18.4*   < > 13.4* 9.5 10.5 10.2 8.3  NEUTROABS 16.1*  --   --   --   --   --   --   HGB 12.6   < > 11.1* 10.0* 8.5* 8.6* 8.3*  HCT 40.3   < > 36.0 33.3* 27.9* 28.5* 27.5*  MCV 92.6   < > 93.3 95.7 95.5 95.6 94.8  PLT 240   < > 212 192 180 195 183   < > = values in this interval not displayed.   Basic Metabolic Panel: Recent Labs  Lab 01/04/20 0439 01/04/20 0439 01/04/20 2039 01/05/20 0342 01/05/20 1146 01/06/20 0844 01/07/20 0324  NA 141  --   --  140 137 137 137  K 4.6  --   --  5.7* 4.6 4.5 4.8  CL 109  --   --  106 105 107 108  CO2 24  --   --  25 25 24 22   GLUCOSE 141*  --   --  147* 129* 92 101*  BUN 32*  --   --  31* 32* 30* 28*  CREATININE 1.45*   < > 1.58* 2.03* 1.91* 1.59* 1.41*  CALCIUM 8.6*  --   --  8.5* 7.8* 8.1* 8.2*   < > = values in this interval not displayed.   GFR: Estimated Creatinine Clearance: 37.4 mL/min (A) (by C-G formula based on SCr of 1.41 mg/dL (H)). Liver Function Tests: No results for input(s): AST, ALT, ALKPHOS, BILITOT, PROT, ALBUMIN in the last 168 hours. No results for input(s): LIPASE, AMYLASE in the last 168 hours. No results for input(s): AMMONIA in the last 168 hours. Coagulation Profile: No results for input(s): INR, PROTIME in the last 168  hours. Cardiac Enzymes: No results for input(s): CKTOTAL, CKMB, CKMBINDEX, TROPONINI in the last 168 hours. BNP (last 3 results) No results for input(s): PROBNP in the last 8760 hours. HbA1C: No results for input(s): HGBA1C in the last 72 hours. CBG: No results for input(s): GLUCAP in the last 168 hours. Lipid Profile: No results for input(s): CHOL, HDL, LDLCALC, TRIG, CHOLHDL, LDLDIRECT in the last 72 hours. Thyroid Function Tests: No results for input(s): TSH, T4TOTAL, FREET4, T3FREE, THYROIDAB in the last 72 hours. Anemia Panel: No results for input(s): VITAMINB12, FOLATE, FERRITIN, TIBC, IRON, RETICCTPCT in the last 72  hours. Sepsis Labs: No results for input(s): PROCALCITON, LATICACIDVEN in the last 168 hours.  Recent Results (from the past 240 hour(s))  SARS Coronavirus 2 by RT PCR (hospital order, performed in [facility] hospital lab) Nasopharyngeal Nasopharyngeal Swab     Status: None   Collection Time: 01/03/20  8:56 PM   Specimen: Nasopharyngeal Swab  Result Value Ref Range Status   SARS Coronavirus 2 NEGATIVE NEGATIVE Final    Comment: (NOTE) SARS-CoV-2 target nucleic acids are NOT DETECTED.  The SARS-CoV-2 RNA is generally detectable in upper and lower respiratory specimens during the acute phase of infection. The lowest concentration of SARS-CoV-2 viral copies this assay can detect is 250 copies / mL. A negative result does not preclude SARS-CoV-2 infection and should not be used as the sole basis for treatment or other patient management decisions.  A negative result may occur with improper specimen collection / handling, submission of specimen other than nasopharyngeal swab, presence of viral mutation(s) within the areas targeted by this assay, and inadequate number of viral copies (<250 copies / mL). A negative result must be combined with clinical observations, patient history, and epidemiological information.  Fact Sheet for Patients:    StrictlyIdeas.no  Fact Sheet for Healthcare Providers: BankingDealers.co.za  This test is not yet approved or  cleared by the [place] FDA and has been authorized for detection and/or diagnosis of SARS-CoV-2 by FDA under an Emergency Use Authorization (EUA).  This EUA will remain in effect (meaning this test can be used) for the duration of the COVID-19 declaration under Section 564(b)(1) of the Act, 21 U.S.C. section 360bbb-3(b)(1), unless the authorization is terminated or revoked sooner.  Performed at [facility], [place] [address] [place], [place] [address]          Radiology Studies: No results found.      Scheduled Meds: . atenolol  50 mg Oral QHS  . docusate sodium  100 mg Oral BID  . enoxaparin (LOVENOX) injection  30 mg Subcutaneous Q24H  . feeding supplement (ENSURE ENLIVE)  237 mL Oral Q1400  . ferrous sulfate  325 mg Oral Q breakfast  . gabapentin  300 mg Oral BID  . mouth rinse  15 mL Mouth Rinse BID  . multivitamin with minerals  1 tablet Oral Daily  . polyethylene glycol  17 g Oral BID  . rosuvastatin  10 mg Oral QHS   Continuous Infusions: . methocarbamol (ROBAXIN) IV       LOS: 5 days    Time spent: 35 minutes.     Elmarie Shiley, MD [facility]   If 7PM-7AM, please contact night-coverage [email or web address]  01/08/2020, 2:31 PM

## 2020-01-09 LAB — BASIC METABOLIC PANEL
Anion gap: 8 (ref 5–15)
BUN: 20 mg/dL (ref 8–23)
CO2: 28 mmol/L (ref 22–32)
Calcium: 8.7 mg/dL — ABNORMAL LOW (ref 8.9–10.3)
Chloride: 101 mmol/L (ref 98–111)
Creatinine, Ser: 1.16 mg/dL — ABNORMAL HIGH (ref 0.44–1.00)
GFR calc Af Amer: 52 mL/min — ABNORMAL LOW (ref >60)
GFR calc non Af Amer: 45 mL/min — ABNORMAL LOW (ref >60)
Glucose, Bld: 114 mg/dL — ABNORMAL HIGH (ref 70–99)
Potassium: 5 mmol/L (ref 3.5–5.1)
Sodium: 137 mmol/L (ref 135–145)

## 2020-01-09 LAB — HEMOGLOBIN AND HEMATOCRIT, BLOOD
HCT: 27.6 % — ABNORMAL LOW (ref 36.0–46.0)
Hemoglobin: 8.4 g/dL — ABNORMAL LOW (ref 12.0–15.0)

## 2020-01-09 LAB — SARS CORONAVIRUS 2 (TAT 6-24 HRS): SARS Coronavirus 2: NEGATIVE

## 2020-01-09 MED ORDER — SODIUM ZIRCONIUM CYCLOSILICATE 5 G PO PACK
5.0000 g | PACK | Freq: Once | ORAL | Status: AC
  Administered 2020-01-09: 5 g via ORAL
  Filled 2020-01-09: qty 1

## 2020-01-09 MED ORDER — ENOXAPARIN SODIUM 40 MG/0.4ML ~~LOC~~ SOLN
40.0000 mg | SUBCUTANEOUS | Status: DC
  Administered 2020-01-10 – 2020-01-11 (×2): 40 mg via SUBCUTANEOUS
  Filled 2020-01-09 (×2): qty 0.4

## 2020-01-09 NOTE — Plan of Care (Signed)

## 2020-01-09 NOTE — Plan of Care (Signed)
  Problem: Education: Goal: Knowledge of General Education information will improve Description: Including pain rating scale, medication(s)/side effects and non-pharmacologic comfort measures 01/09/2020 2057 by Guinevere Scarlet, RN Outcome: Progressing 01/09/2020 2056 by Guinevere Scarlet, RN Outcome: Progressing   Problem: Health Behavior/Discharge Planning: Goal: Ability to manage health-related needs will improve 01/09/2020 2057 by Guinevere Scarlet, RN Outcome: Progressing 01/09/2020 2056 by Guinevere Scarlet, RN Outcome: Progressing   Problem: Clinical Measurements: Goal: Ability to maintain clinical measurements within normal limits will improve 01/09/2020 2057 by Guinevere Scarlet, RN Outcome: Progressing 01/09/2020 2056 by Guinevere Scarlet, RN Outcome: Progressing

## 2020-01-09 NOTE — TOC Progression Note (Signed)
Transition of Care [facility]) - Progression Note    Patient Details  Name: Laura Chambers MRN: [id number] Date of Birth: 1941-05-23  Transition of Care [facility]) CM/SW Contact  Sharin Mons, RN Phone Number: [phone] 01/09/2020, 1:38 PM  Clinical Narrative:    Awaiting SNF bed offer. Insurance authorization pending. Pt will need updated COVID for SNF placement, nurse made aware.  TOC team will continue to monitor for needs....   Expected Discharge Plan: Skilled Nursing Facility Barriers to Discharge: Ship broker, SNF Pending bed offer  Expected Discharge Plan and Services Expected Discharge Plan: [place] In-house Referral: Clinical Social Work Discharge Planning Services: CM Consult Post Acute Care Choice: [place] arrangements for the past 2 months: Single Family Home                 DME Arranged: N/A DME Agency: NA       HH Arranged: PT [place] Agency: [place] (now [facility]) Date [place]: 01/05/20   Representative spoke with at [place]: [place] ([place]) Interventions    Readmission Risk Interventions No flowsheet data found.

## 2020-01-09 NOTE — Progress Notes (Signed)
PROGRESS NOTE    Laura Chambers  MGQ:[id number] DOB: 10/01/1940 DOA: 01/03/2020 PCP: Leamon Arnt, MD   Brief Narrative: 79 year old with past medical history significant for rheumatoid arthritis, hypertension, hyperlipidemia presented to the ER after a mechanical fall.  Patient states that she has a left foot drop and recently was diagnosed with problems with her right foot arch by her rheumatologist.  She walks with help of her walker.  She slipped and fell.  X-ray in the ED shows features concerning for right periprosthetic knee fracture.  Patient underwent open reduction and internal fixation of right distal femoral fracture by Dr. Doreatha Martin on 6/30.  Assessment & Plan:   Principal Problem:   Supracondylar fracture of right femur (HCC) Active Problems:   Dyslipidemia   Essential hypertension   Obesity, Class III, BMI 40-49.9 (morbid obesity) (HCC)   Rheumatoid arthritis involving multiple sites with positive rheumatoid factor (HCC)   Femur fracture, right (HCC)   Acute blood loss anemia   1-Right knee periprostatic fracture: Orthopedic consulted and following Patient underwent open reduction and internal fixation of right distal femoral fracture by Dr. Doreatha Martin on 6/30 PT OT per surgery DVT prophylaxis: Lovenox Received antibiotics prophylaxis Pain controlled.  Agree to go to Rehab.   2-Hypertension: Continue with atenolol.  Hold Norvasc due to soft blood pressure to avoid further worsening of renal function Well controlled.   3-Hyperlipidemia: Continue with statins  4-Rheumatoid arthritis: per orhto hold plaquenil for 4 weeks. Post surgery   5-AKI on CKD stage IIIb: Creatinine baseline 1.5. Worsening renal function post surgery , suspect related To hypovolemia hypotension. Monitor urine output, bladder scan. Back to baseline. Stop IV fluids.   6-Hyperkalemia; Patient was getting IV fluids with potassium supplementation.  I have changed fluids to normal  saline. EKG without peak T wave.  Received a dose of Lokelma K at 5, will give another dose of lokelma  7-Acute blood loss anemia, post surgery expected.  Hemoglobin trending down today at 8.  Stop fluids . Asymptomatic. No indication for transfusion at this time Hb stable. 8 range. Started iron supplement.   8-Leukocytosis: Secondary to acute trauma: Resolved 9-acute hypoxic respiratory failure: Plus surgery requiring 2 L of oxygen. Incentive spirometry Careful with sedatives. Off oxygen.  Check oxygen sat overnight. Might required oxygen while sleep.   10-Constipation; didn't tolerated dulcolax. Had BM after fleet enema 11-Nausea; after she got up and out of bed. EKG no acute changes. Gas x and zofran PRN>    Nutrition Problem: Increased nutrient needs Etiology: post-op healing    Signs/Symptoms: estimated needs    Interventions: MVI, Ensure Enlive (each supplement provides 350kcal and 20 grams of protein)  Estimated body mass index is 31.32 kg/m as calculated from the following:   Height as of this encounter: 5\' 7"  (1.702 m).   Weight as of this encounter: 90.7 kg.   DVT prophylaxis: Lovenox Code Status: Full code Family Communication: Care discussed with patient and son who was at bedside.  Disposition Plan:  Status is: Inpatient  Remains inpatient appropriate because: Awaiting SNF   Dispo: The patient is from: Home              Anticipated d/c is to: SNF              Anticipated d/c date is: 2 days              Patient currently is medically stable to d/c. awaiting SNF      Consultants:  Orthopedic  Procedures:  Patient underwent open reduction and internal fixation of right distal femoral fracture by Dr. Doreatha Martin on 6/30  Antimicrobials:    Subjective: Denies chest pain or dyspnea. Report nausea. gas  Objective: Vitals:   01/08/20 1315 01/08/20 2029 01/09/20 0534 01/09/20 0801  BP: (!) 116/50 (!) 142/54 (!) 143/60 (!) 121/47  Pulse: 65 78 66  66  Resp: 16 16 16 17   Temp: 97.8 F (36.6 C) 99.3 F (37.4 C) 98.3 F (36.8 C) 98.1 F (36.7 C)  TempSrc: Oral Oral Oral Oral  SpO2: 92% 96% 95% 95%  Weight:      Height:        Intake/Output Summary (Last 24 hours) at 01/09/2020 1201 Last data filed at 01/09/2020 0950 Gross per 24 hour  Intake --  Output 800 ml  Net -800 ml   Filed Weights   01/04/20 1301  Weight: 90.7 kg    Examination:  General exam: NAD Respiratory system: CTA Cardiovascular system: S 1, S 2 RRR Gastrointestinal system: BS present, soft nt Central nervous system: Non focal.   Extremities: Right LE incision clean  Data Reviewed: I have personally reviewed following labs and imaging studies  CBC: Recent Labs  Lab 01/03/20 2056 01/04/20 0439 01/04/20 2039 01/04/20 2039 01/05/20 0342 01/06/20 0844 01/07/20 0324 01/08/20 1108 01/09/20 0350  WBC 18.4*   < > 13.4*  --  9.5 10.5 10.2 8.3  --   NEUTROABS 16.1*  --   --   --   --   --   --   --   --   HGB 12.6   < > 11.1*   < > 10.0* 8.5* 8.6* 8.3* 8.4*  HCT 40.3   < > 36.0   < > 33.3* 27.9* 28.5* 27.5* 27.6*  MCV 92.6   < > 93.3  --  95.7 95.5 95.6 94.8  --   PLT 240   < > 212  --  192 180 195 183  --    < > = values in this interval not displayed.   Basic Metabolic Panel: Recent Labs  Lab 01/04/20 0439 01/04/20 0439 01/04/20 2039 01/05/20 0342 01/05/20 1146 01/06/20 0844 01/07/20 0324  NA 141  --   --  140 137 137 137  K 4.6  --   --  5.7* 4.6 4.5 4.8  CL 109  --   --  106 105 107 108  CO2 24  --   --  25 25 24 22   GLUCOSE 141*  --   --  147* 129* 92 101*  BUN 32*  --   --  31* 32* 30* 28*  CREATININE 1.45*   < > 1.58* 2.03* 1.91* 1.59* 1.41*  CALCIUM 8.6*  --   --  8.5* 7.8* 8.1* 8.2*   < > = values in this interval not displayed.   GFR: Estimated Creatinine Clearance: 37.4 mL/min (A) (by C-G formula based on SCr of 1.41 mg/dL (H)). Liver Function Tests: No results for input(s): AST, ALT, ALKPHOS, BILITOT, PROT, ALBUMIN in the  last 168 hours. No results for input(s): LIPASE, AMYLASE in the last 168 hours. No results for input(s): AMMONIA in the last 168 hours. Coagulation Profile: No results for input(s): INR, PROTIME in the last 168 hours. Cardiac Enzymes: No results for input(s): CKTOTAL, CKMB, CKMBINDEX, TROPONINI in the last 168 hours. BNP (last 3 results) No results for input(s): PROBNP in the last 8760 hours. HbA1C: No results for input(s): HGBA1C in  the last 72 hours. CBG: No results for input(s): GLUCAP in the last 168 hours. Lipid Profile: No results for input(s): CHOL, HDL, LDLCALC, TRIG, CHOLHDL, LDLDIRECT in the last 72 hours. Thyroid Function Tests: No results for input(s): TSH, T4TOTAL, FREET4, T3FREE, THYROIDAB in the last 72 hours. Anemia Panel: No results for input(s): VITAMINB12, FOLATE, FERRITIN, TIBC, IRON, RETICCTPCT in the last 72 hours. Sepsis Labs: No results for input(s): PROCALCITON, LATICACIDVEN in the last 168 hours.  Recent Results (from the past 240 hour(s))  SARS Coronavirus 2 by RT PCR (hospital order, performed in [facility] hospital lab) Nasopharyngeal Nasopharyngeal Swab     Status: None   Collection Time: 01/03/20  8:56 PM   Specimen: Nasopharyngeal Swab  Result Value Ref Range Status   SARS Coronavirus 2 NEGATIVE NEGATIVE Final    Comment: (NOTE) SARS-CoV-2 target nucleic acids are NOT DETECTED.  The SARS-CoV-2 RNA is generally detectable in upper and lower respiratory specimens during the acute phase of infection. The lowest concentration of SARS-CoV-2 viral copies this assay can detect is 250 copies / mL. A negative result does not preclude SARS-CoV-2 infection and should not be used as the sole basis for treatment or other patient management decisions.  A negative result may occur with improper specimen collection / handling, submission of specimen other than nasopharyngeal swab, presence of viral mutation(s) within the areas targeted by this assay, and  inadequate number of viral copies (<250 copies / mL). A negative result must be combined with clinical observations, patient history, and epidemiological information.  Fact Sheet for Patients:   StrictlyIdeas.no  Fact Sheet for Healthcare Providers: BankingDealers.co.za  This test is not yet approved or  cleared by the [place] FDA and has been authorized for detection and/or diagnosis of SARS-CoV-2 by FDA under an Emergency Use Authorization (EUA).  This EUA will remain in effect (meaning this test can be used) for the duration of the COVID-19 declaration under Section 564(b)(1) of the Act, 21 U.S.C. section 360bbb-3(b)(1), unless the authorization is terminated or revoked sooner.  Performed at [facility], [place] [address] [place], [place] [address]          Radiology Studies: No results found.      Scheduled Meds: . atenolol  50 mg Oral QHS  . docusate sodium  100 mg Oral BID  . enoxaparin (LOVENOX) injection  30 mg Subcutaneous Q24H  . feeding supplement (ENSURE ENLIVE)  237 mL Oral Q1400  . ferrous sulfate  325 mg Oral Q breakfast  . gabapentin  300 mg Oral BID  . mouth rinse  15 mL Mouth Rinse BID  . multivitamin with minerals  1 tablet Oral Daily  . polyethylene glycol  17 g Oral BID  . rosuvastatin  10 mg Oral QHS   Continuous Infusions: . methocarbamol (ROBAXIN) IV       LOS: 6 days    Time spent: 35 minutes.     Elmarie Shiley, MD [facility]   If 7PM-7AM, please contact night-coverage [email or web address]  01/09/2020, 12:01 PM

## 2020-01-10 MED ORDER — HYDROCODONE-ACETAMINOPHEN 5-325 MG PO TABS: 1.0000 | ORAL_TABLET | ORAL | 0 refills | Status: DC | PRN

## 2020-01-10 MED ORDER — DOCUSATE SODIUM 100 MG PO CAPS: 100.0000 mg | ORAL_CAPSULE | Freq: Two times a day (BID) | ORAL | 0 refills | Status: DC

## 2020-01-10 MED ORDER — METHOCARBAMOL 500 MG PO TABS: 500.0000 mg | ORAL_TABLET | Freq: Four times a day (QID) | ORAL | 0 refills | Status: DC | PRN

## 2020-01-10 MED ORDER — POLYETHYLENE GLYCOL 3350 17 G PO PACK: 17.0000 g | PACK | Freq: Two times a day (BID) | ORAL | 0 refills | Status: DC

## 2020-01-10 MED ORDER — ENOXAPARIN SODIUM 30 MG/0.3ML ~~LOC~~ SOLN: 30.0000 mg | SUBCUTANEOUS | 0 refills | Status: DC

## 2020-01-10 NOTE — Progress Notes (Signed)
Physical Therapy Treatment Patient Details Name: Laura Chambers MRN: [id number] DOB: 08/28/1940 Today's Date: 01/10/2020    History of Present Illness 79 y.o. female with R periprosthetic distal femur fx due to fall on 01/03/20. S.p ORIF femur 01/04/20. PMH L foot drop, osteoporosis, OA, neuropathy, HTN, HLD, anxiety    PT Comments    Patient progressing well towards PT goals. Tolerated there ex in bed prior to mobility. Requires Mod A to stand from EOB and toilet with cues to adhere to TDWB precautions which pt has a difficult time doing when using the stedy. Worked on partial standing/WB through LLE and UE strengthening using back of recliner chair as well as laterally scooting along side bed. Pt eager to get to rehab. Will follow.    Follow Up Recommendations  SNF     Equipment Recommendations  Wheelchair cushion (measurements PT);Wheelchair (measurements PT);3in1 (PT);Rolling walker with 5" wheels    Recommendations for Other Services       Precautions / Restrictions Precautions Precautions: Fall Precaution Comments: left foot drop baseline Restrictions Weight Bearing Restrictions: Yes RLE Weight Bearing: Touchdown weight bearing    Mobility  Bed Mobility Overal bed mobility: Needs Assistance Bed Mobility: Rolling;Sidelying to Sit Rolling: Supervision Sidelying to sit: Supervision;HOB elevated   Sit to supine: Min assist;HOB elevated   General bed mobility comments: Use of rails to get to EOB; assist to bring RLE into bed to return to supine.  Transfers Overall transfer level: Needs assistance Equipment used: Ambulation equipment used   Sit to Stand: From elevated surface;Mod assist        Lateral/Scoot Transfers: Min assist General transfer comment: Worked on partial sit to stands using back of chair for support for WB through LLE and UE strengthening x6. Laterally scooting along side bed x5 with assist. Stood from EOB x1, from stedy seat x2, from toilet x1.  Cues to adhere to to TDWB.  Ambulation/Gait             General Gait Details: unable   Stairs             Wheelchair Mobility    Modified Rankin (Stroke Patients Only)       Balance Overall balance assessment: Needs assistance Sitting-balance support: Feet supported;No upper extremity supported Sitting balance-Leahy Scale: Good Sitting balance - Comments: Could accept moderate challenge outside base of support to date   Standing balance support: During functional activity Standing balance-Leahy Scale: Zero                              Cognition Arousal/Alertness: Awake/alert Behavior During Therapy: WFL for tasks assessed/performed Overall Cognitive Status: Within Functional Limits for tasks assessed                                 General Comments: Pt with increased participation in session, extremely motivated for different exercises she can complete whilst she's in bed      Exercises Total Joint Exercises Ankle Circles/Pumps: AROM;Right;Left;20 reps Straight Leg Raises: Right;10 reps;AROM General Exercises - Lower Extremity Ankle Circles/Pumps: AROM;Right;10 reps;Supine Quad Sets: AROM;Both;10 reps;Supine Long Arc Quad: AROM;Both;10 reps;Supine Heel Slides: AROM;Both;10 reps;Supine Hip ABduction/ADduction: AROM;Both;5 reps;Supine    General Comments        Pertinent Vitals/Pain Pain Assessment: Faces Faces Pain Scale: No hurt Pain Location: R knee with movement Pain Descriptors / Indicators: Constant;Aching;Grimacing;Other (Comment) Pain Intervention(s):  Limited activity within patient's tolerance;Monitored during session;Repositioned    Home Living                      Prior Function            PT Goals (current goals can now be found in the care plan section) Acute Rehab PT Goals Patient Stated Goal: To get stronger.  Progress towards PT goals: Progressing toward goals    Frequency    Min  3X/week      PT Plan Current plan remains appropriate    Co-evaluation              AM-PAC PT "6 Clicks" Mobility   Outcome Measure  Help needed turning from your back to your side while in a flat bed without using bedrails?: None Help needed moving from lying on your back to sitting on the side of a flat bed without using bedrails?: A Little Help needed moving to and from a bed to a chair (including a wheelchair)?: Total Help needed standing up from a chair using your arms (e.g., wheelchair or bedside chair)?: A Lot Help needed to walk in hospital room?: Total Help needed climbing 3-5 steps with a railing? : Total 6 Click Score: 12    End of Session Equipment Utilized During Treatment: Gait belt Activity Tolerance: Patient tolerated treatment well Patient left: in bed;with call bell/phone within reach;with bed alarm set;with SCD's reapplied Nurse Communication: Mobility status;Need for lift equipment PT Visit Diagnosis: Unsteadiness on feet (R26.81);Other abnormalities of gait and mobility (R26.89);Muscle weakness (generalized) (M62.81);Other symptoms and signs involving the nervous system (R29.898)     Time: 3419-6222 PT Time Calculation (min) (ACUTE ONLY): 28 min  Charges:  $Therapeutic Exercise: 8-22 mins $Therapeutic Activity: 8-22 mins                     Marisa Severin, PT, DPT Acute Rehabilitation Services Pager [phone] Office [place] 01/10/2020, 3:30 PM

## 2020-01-10 NOTE — TOC Progression Note (Addendum)
Transition of Care [facility]) - Progression Note    Patient Details  Name: Laura Chambers MRN: [id number] Date of Birth: 1940/07/13  [facility]) CM/SW Contact  Sharin Mons, RN Phone Number: 01/10/2020, 10:05 AM  Clinical Narrative:     NCM received call from HTA. HTA rep. Stated SNF under review for insurance authorization. Medical advisor , Dr. Amalia Hailey requesting peer to peer, [phone], from MD. NCM made MD aware.  States pt doesn't meet criteria secondary to neuropathy and partial weight bearing.   Authorization received for ambulance transportation, [id number].  TOC team will continue to monitor and follow ....    11:14 HTA called with approval for SNF  X 7 DAYS, authorization # [id number].   1941 Unable to confirm acceptance to [facility] SNF with admission liaison for today. NCM f/u in am....   Expected Discharge Plan: Skilled Nursing Facility Barriers to Discharge: Ship broker, SNF Pending bed offer  Expected Discharge Plan and Services Expected Discharge Plan: [place] In-house Referral: Clinical Social Work Discharge Planning Services: CM Consult Post Acute Care Choice: [place] arrangements for the past 2 months: [place] Expected Discharge Date: 01/10/20               DME Arranged: N/A DME Agency: NA       HH Arranged: PT [place] Agency: [place] ([facility]) Date [place]: 01/05/20   Representative spoke with at [place]: [place] ([place]) Interventions    Readmission Risk Interventions No flowsheet data found.

## 2020-01-10 NOTE — Progress Notes (Signed)
Occupational Therapy Treatment Patient Details Name: Laura Chambers MRN: [id number] DOB: 1941/04/06 Today's Date: 01/10/2020    History of present illness 79 y.o. female with R periprosthetic distal femur fx due to fall on 01/03/20. S.p ORIF femur 01/04/20. PMH L foot drop, osteoporosis, OA, neuropathy, HTN, HLD, anxiety   OT comments  Patient continues to make steady progress towards goals in skilled OT session. Patient's session encompassed bed level exercises and basic ADLs at EOB as pt had been up for the majority of the morning and did not want to transfer. Pt with increased bed mobility and adherences to exercises, asking for various ways to aide with strength during down time. Discharge remains appropriate at this time; will continue to follow acutely.    Follow Up Recommendations  SNF    Equipment Recommendations  Other (comment) (Defer to next venue)    Recommendations for Other Services      Precautions / Restrictions Precautions Precautions: Fall Restrictions Weight Bearing Restrictions: Yes RLE Weight Bearing: Touchdown weight bearing       Mobility Bed Mobility Overal bed mobility: Needs Assistance Bed Mobility: Rolling;Sidelying to Sit Rolling: Supervision Sidelying to sit: Supervision          Transfers                 General transfer comment: Deferred as pt wanted to sit at EOB and eat lunch    Balance Overall balance assessment: Needs assistance Sitting-balance support: Feet supported;No upper extremity supported Sitting balance-Leahy Scale: Good Sitting balance - Comments: Could accept moderate challenge outside base of support to date                                   ADL either performed or assessed with clinical judgement   ADL Overall ADL's : Needs assistance/impaired Eating/Feeding: Set up Eating/Feeding Details (indicate cue type and reason): Opened containers due to arthritis Grooming: Wash/dry hands;Brushing  hair;Sitting Grooming Details (indicate cue type and reason): Sitting EOB                             Functional mobility during ADLs: Set up;Minimal assistance General ADL Comments: Set-up/Min A to complete basic ADLs EOB, pt was up in the chair in the AM and wanted to eat lunch EOB     Vision       Perception     Praxis      Cognition Arousal/Alertness: Awake/alert Behavior During Therapy: WFL for tasks assessed/performed Overall Cognitive Status: Within Functional Limits for tasks assessed                                 General Comments: Pt with increased participation in session, extremely motivated for different exercises she can complete whilst she's in bed        Exercises Total Joint Exercises Ankle Circles/Pumps: AROM;Right;Left;20 reps Straight Leg Raises: Right;10 reps;AROM   Shoulder Instructions       General Comments      Pertinent Vitals/ Pain       Pain Assessment: Faces Faces Pain Scale: Hurts a little bit Pain Location: R knee with movement Pain Descriptors / Indicators: Constant;Aching;Grimacing;Other (Comment) Pain Intervention(s): Limited activity within patient's tolerance;Monitored during session;Repositioned  Home Living  Prior Functioning/Environment              Frequency  Min 2X/week        Progress Toward Goals  OT Goals(current goals can now be found in the care plan section)  Progress towards OT goals: Progressing toward goals  Acute Rehab OT Goals Patient Stated Goal: To get stronger.  OT Goal Formulation: With patient Time For Goal Achievement: 01/19/20 Potential to Achieve Goals: Good  Plan Discharge plan remains appropriate    Co-evaluation                 AM-PAC OT "6 Clicks" Daily Activity     Outcome Measure   Help from another person eating meals?: None Help from another person taking care of personal grooming?:  A Little Help from another person toileting, which includes using toliet, bedpan, or urinal?: A Lot Help from another person bathing (including washing, rinsing, drying)?: A Lot Help from another person to put on and taking off regular upper body clothing?: A Little Help from another person to put on and taking off regular lower body clothing?: A Lot 6 Click Score: 16    End of Session    OT Visit Diagnosis: Unsteadiness on feet (R26.81);Muscle weakness (generalized) (M62.81);History of falling (Z91.81);Pain Pain - Right/Left: Right Pain - part of body: Knee;Leg   Activity Tolerance Patient tolerated treatment well   Patient Left in bed;with call bell/phone within reach   Nurse Communication Mobility status;Other (comment) (Sitting EOB to eat lunch therefore no alarm set)        Time: 2458-0998 OT Time Calculation (min): 20 min  Charges: OT General Charges $OT Visit: 1 Visit OT Treatments $Self Care/Home Management : 8-22 mins  Corinne Ports E. Monserath Neff, COTA/L Acute Rehabilitation Services [phone] [place] 01/10/2020, 2:08 PM

## 2020-01-10 NOTE — Progress Notes (Signed)
Orthopaedic Trauma Progress Note  S: Pain improving. Moving around a little better. Previously wanted to go home with [facility] PT[facility] but now feels that since she lives alone, she would do better at rehab facility for short period of time before returning home.  O:  Vitals:   01/09/20 2031 01/10/20 0356  BP: (!) 125/57 (!) 125/51  Pulse: 78 68  Resp: 15 14  Temp: 98.1 F (36.7 C) 98.5 F (36.9 C)  SpO2: 96% 97%    General: Sitting up on edge of bed, assisted patient to lay back down. NAD Respiratory: No increased work of breathing. Right lower extremity: Incisions are clean, dry, and intact.  Mild knee effusion. Some bruising around knee incision, no unexpected. No drainage. Tenderness with palpation to the distal thigh but nontender through the lower leg, ankle, foot. Patient has neuropathy at baseline but does endorse sensation to light touch intermittently throughout the lower extremity.  Able to wiggle each of her toes.  Ankle dorsiflexion plantarflexion is intact.  Extremity warm.+ DP pulse  Imaging: Stable post op imaging.   Labs:  Results for orders placed or performed during the hospital encounter of 01/03/20 (from the past 24 hour(s))  Basic metabolic panel     Status: Abnormal   Collection Time: 01/09/20 12:11 PM  Result Value Ref Range   Sodium 137 135 - 145 mmol/L   Potassium 5.0 3.5 - 5.1 mmol/L   Chloride 101 98 - 111 mmol/L   CO2 28 22 - 32 mmol/L   Glucose, Bld 114 (H) 70 - 99 mg/dL   BUN 20 8 - 23 mg/dL   Creatinine, Ser 1.16 (H) 0.44 - 1.00 mg/dL   Calcium 8.7 (L) 8.9 - 10.3 mg/dL   GFR calc non Af Amer 45 (L) >60 mL/min   GFR calc Af Amer 52 (L) >60 mL/min   Anion gap 8 5 - 15  SARS CORONAVIRUS 2 (TAT 6-24 HRS) Nasopharyngeal Nasopharyngeal Swab     Status: None   Collection Time: 01/09/20  5:05 PM   Specimen: Nasopharyngeal Swab  Result Value Ref Range   SARS Coronavirus 2 NEGATIVE NEGATIVE    Assessment: 79 year old female status post fall, 6 Days Post-Op    Injuries: Right periprosthetic distal femur fracture status post ORIF  Weightbearing: TDWB RLE  Insicional and dressing care: Okay to leave open to air  Showering: Okay to begin showering with assistance on 01/08/2020  Orthopedic device(s): None   CV/Blood loss: Acute blood loss anemia, Hgb remains stable around 8.3   Pain management:  1. Tylenol 325-650 mg q 6 hours PRN 2. Robaxin 500 mg q 6 hours PRN 3. Norco 5-325 mg q 4 hours PRN 4. Neurontin 300 mg BID 5. Morphine 0.5-1 mg q 2 hours PRN  VTE prophylaxis: Lovenox, SCDs  ID: [id number] 2gm post op completed  Foley/Lines: No foley, KVO IVFs  Medical co-morbidities: Rheumatoid arthritis, hypertension, hyperlipidemia  Impediments to Fracture Healing: Vitamin D level looks good at 35, no need for supplementation  Dispo: Up with therapies as tolerated. Plan to hold patient Plaquenil for approximately 4 weeks.  SW following, awaiting SNF bed offer and insurance authorization.  Have signed and placed d/c rx for pain medication and DVT prophylaxis in chart  Follow - up plan: 2 weeks  Contact information:  Katha Hamming MD, Patrecia Pace PA-C   [facility] [phone] (office) [email or web address]

## 2020-01-10 NOTE — Discharge Summary (Addendum)
Physician Discharge Summary  Laura Chambers MWU:[id number] DOB: Nov 01, 1940 DOA: 01/03/2020  PCP: Leamon Arnt, MD  Admit date: 01/03/2020 Discharge date: 01/10/2020  Admitted From: Home  Disposition:  SNF  Recommendations for Outpatient Follow-up:  1. Follow up with PCP in 1-2 weeks 2. Please obtain BMP/CBC in one week 3. Needs follow up on Hb level.  4. Follow up with Ortho in 5. Hold Plaquenil for 4 weeks post op.  6. Please check oxygen sat overnight , arrange home oxygen as needed.    Discharge Condition: Stable.  CODE STATUS: Full code Diet recommendation: Heart Healthy   Brief/Interim Summary: 79 year old with past medical history significant for rheumatoid arthritis, hypertension, hyperlipidemia presented to the ER after a mechanical fall.  Patient states that she has a left foot drop and recently was diagnosed with problems with her right foot arch by her rheumatologist.  She walks with help of her walker.  She slipped and fell.  X-ray in the ED shows features concerning for right periprosthetic knee fracture.  Patient underwent open reduction and internal fixation of right distal femoral fracture by Dr. Doreatha Martin on 6/30.   1-Right knee periprostatic fracture: Orthopedic consulted and following Patient underwent open reduction and internal fixation of right distal femoral fracture by Dr. Doreatha Martin on 6/30 PT OT per surgery DVT prophylaxis: Lovenox Received antibiotics prophylaxis Pain controlled.  Agree to go to Rehab.  Discussed case with PA, Ricci Barker, they think patient will benefit from rehab for 2-3 weeks, after that she will probably be able to go home with [facility] PT>   2-Hypertension: Continue with atenolol.  Hold Norvasc due to soft blood pressure to avoid further worsening of renal function Well controlled.   3-Hyperlipidemia: Continue with statins  4-Rheumatoid arthritis: per orhto hold plaquenil for 4 weeks. Post surgery   5-AKI on CKD stage IIIb:  Creatinine baseline 1.5. Worsening renal function post surgery , suspect related To hypovolemia hypotension. Monitor urine output, bladder scan. Back to baseline. Stop IV fluids.   6-Hyperkalemia; Patient was getting IV fluids with potassium supplementation.  I have changed fluids to normal saline. EKG without peak T wave.  Received a dose of Lokelma K at 5, will give another dose of lokelma  7-Acute blood loss anemia, post surgery expected.  Hemoglobin trending down today at 8.  Stop fluids . Asymptomatic. No indication for transfusion at this time Hb stable. 8 range. Started iron supplement.   8-Leukocytosis: Secondary to acute trauma: Resolved 9-Acute hypoxic respiratory failure: Plus surgery requiring 2 L of oxygen. Incentive spirometry Careful with sedatives. Off oxygen.  Check oxygen sat overnight. Might required oxygen while sleep.  Continue with oxygen at bedtime   10-Constipation; didn't tolerated dulcolax. Had BM after fleet enema 11-Nausea; after she got up and out of bed. EKG no acute changes. Gas x and zofran PRN> Resolved.    Nutrition Problem: Increased nutrient needs Etiology: post-op healing    Discharge Diagnoses:  Principal Problem:   Supracondylar fracture of right femur ([place]) Active Problems:   Dyslipidemia   Essential hypertension   Obesity, Class III, BMI 40-49.9 (morbid obesity) (HCC)   Rheumatoid arthritis involving multiple sites with positive rheumatoid factor (HCC)   Femur fracture, right (HCC)   Acute blood loss anemia    Discharge Instructions  Discharge Instructions    Diet - low sodium heart healthy   Complete by: As directed    Increase activity slowly   Complete by: As directed    No wound care  Complete by: As directed      Allergies as of 01/10/2020      Reactions   Ativan [lorazepam] Other (See Comments)   Highly irritable   Haldol [haloperidol] Other (See Comments)   Vegetative State   Epinephrine Other (See  Comments)   Reaction:  Increased pts BP and caused shoulder to jerk    Levofloxacin Nausea And Vomiting   Lipitor [atorvastatin] Other (See Comments)   Reaction:  Joint pain and dizziness    Trovan [alatrofloxacin] Nausea And Vomiting   Pt states that this med caused pancreatitis.      Medication List    STOP taking these medications   amLODipine 10 MG tablet Commonly known as: NORVASC   GINGER ROOT PO   hydroxychloroquine 200 MG tablet Commonly known as: PLAQUENIL     TAKE these medications   atenolol 50 MG tablet Commonly known as: TENORMIN TAKE 1 TABLET BY MOUTH EVERY DAY   Calcium 600+D 600-400 MG-UNIT tablet Generic drug: Calcium Carbonate-Vitamin D Take 1 tablet by mouth every other day.   docusate sodium 100 MG capsule Commonly known as: COLACE Take 1 capsule (100 mg total) by mouth 2 (two) times daily.   enoxaparin 30 MG/0.3ML injection Commonly known as: LOVENOX Inject 0.3 mLs (30 mg total) into the skin daily.   ferrous sulfate 325 (65 FE) MG EC tablet Take 325 mg by mouth daily with breakfast.   FLUoxetine 20 MG capsule Commonly known as: PROZAC TAKE 1 CAPSULE BY MOUTH EVERY DAY IN THE MORNING What changed: See the new instructions.   folic acid 102 MCG tablet Commonly known as: FOLVITE Take 800 mcg daily by mouth.   furosemide 40 MG tablet Commonly known as: LASIX Take 1 tablet (40 mg total) daily as needed by mouth for edema. What changed: Another medication with the same name was removed. Continue taking this medication, and follow the directions you see here.   gabapentin 300 MG capsule Commonly known as: NEURONTIN TAKE 1 CAPSULE BY MOUTH 2 TIMES DAILY   HYDROcodone-acetaminophen 5-325 MG tablet Commonly known as: NORCO/VICODIN Take 1-2 tablets by mouth every 4 (four) hours as needed for moderate pain or severe pain.   methocarbamol 500 MG tablet Commonly known as: ROBAXIN Take 1 tablet (500 mg total) by mouth every 6 (six) hours as  needed for muscle spasms.   multivitamin with minerals Tabs tablet Take 1 tablet by mouth daily.   Omega-3 1000 MG Caps Take 1 capsule by mouth daily.   polyethylene glycol 17 g packet Commonly known as: MIRALAX / GLYCOLAX Take 17 g by mouth 2 (two) times daily.   promethazine 25 MG tablet Commonly known as: PHENERGAN Take 1 tablet (25 mg total) by mouth every 8 (eight) hours as needed for nausea or vomiting.   rosuvastatin 10 MG tablet Commonly known as: CRESTOR TAKE 1 TABLET BY MOUTH EVERY DAY   tamsulosin 0.4 MG Caps capsule Commonly known as: FLOMAX Take 0.4 mg by mouth daily as needed (urination).   TART CHERRY ADVANCED PO Take 1 tablet by mouth daily.   Turmeric 400 MG Caps Take 1 capsule by mouth daily.       Follow-up Information    [facility] Follow up.   Why: home health services arranged, [place] 7/7       Haddix, Thomasene Lot, MD. Schedule an appointment as soon as possible for a visit in 2 week(s).   Specialty: Orthopedic Surgery Why: for repeat x-rays and wound check Contact  information: [place] [address] [phone]              Allergies  Allergen Reactions  . Ativan [Lorazepam] Other (See Comments)    Highly irritable  . Haldol [Haloperidol] Other (See Comments)    Vegetative State  . Epinephrine Other (See Comments)    Reaction:  Increased pts BP and caused shoulder to jerk   . Levofloxacin Nausea And Vomiting  . Lipitor [Atorvastatin] Other (See Comments)    Reaction:  Joint pain and dizziness   . Trovan [Alatrofloxacin] Nausea And Vomiting    Pt states that this med caused pancreatitis.    Consultations:  Dr Doreatha Martin   Procedures/Studies: DG Chest 1 View  Result Date: 01/03/2020 CLINICAL DATA:  Preoperative assessment for right femur fracture, hypertension EXAM: CHEST  1 VIEW COMPARISON:  06/09/2017 FINDINGS: Single frontal view of the chest demonstrates a stable cardiac silhouette. Mild atherosclerosis  of the aortic arch. No airspace disease, effusion, or pneumothorax. Surgical clips at the gastroesophageal junction. No acute bony abnormalities. IMPRESSION: 1. No acute intrathoracic process. Electronically Signed   By: Randa Ngo M.D.   On: 01/03/2020 20:58   DG Tibia/Fibula Right  Result Date: 01/03/2020 CLINICAL DATA:  Right knee pain after fall EXAM: RIGHT TIBIA AND FIBULA - 2 VIEW COMPARISON:  07/29/2017 FINDINGS: Frontal and lateral views of the right tibia and fibula are obtained. Portions of the right knee arthroplasty are excluded by collimation but are fully evaluated on the corresponding right knee evaluation. I do not see any tibial or fibular fractures. There is diffuse subcutaneous edema. IMPRESSION: 1. No acute fracture of the right tibia or fibula. Electronically Signed   By: Randa Ngo M.D.   On: 01/03/2020 20:14   DG Knee Complete 4 Views Right  Result Date: 01/03/2020 CLINICAL DATA:  [place] today, right knee pain EXAM: RIGHT KNEE - COMPLETE 4+ VIEW COMPARISON:  07/29/2017 FINDINGS: Frontal, bilateral oblique, lateral views of the right knee are obtained. There is a comminuted displaced periprosthetic fracture of the distal right femur. There is approximately 2.4 cm of dorsal displacement of the distal fracture fragment. The femoral and tibial components of the arthroplasty remain well aligned. No other bony abnormalities. There is diffuse soft tissue swelling. IMPRESSION: 1. Comminuted and displaced periprosthetic distal right femur fracture. Electronically Signed   By: Randa Ngo M.D.   On: 01/03/2020 20:13   DG Knee Right Port  Result Date: 01/04/2020 CLINICAL DATA:  Periprosthetic right femoral fracture, ORIF EXAM: PORTABLE RIGHT KNEE - 1-2 VIEW COMPARISON:  01/04/2020, 01/03/2020 FINDINGS: Frontal and lateral views of the right knee demonstrate lateral plate and screw fixation traversing the comminuted distal right femoral periprosthetic fracture seen previously. The  superior margin of the fusion plate is excluded by collimation. Alignment is near anatomic. Right knee is well aligned. Postsurgical changes are seen in the soft tissues. IMPRESSION: 1. ORIF of a periprosthetic right femoral fracture as above. Electronically Signed   By: Randa Ngo M.D.   On: 01/04/2020 20:12   DG C-Arm 1-60 Min  Result Date: 01/04/2020 CLINICAL DATA:  Femur fracture EXAM: RIGHT FEMUR 2 VIEWS; DG C-ARM 1-60 MIN COMPARISON:  01/03/2020 FINDINGS: Nine low resolution intraoperative spot views of the right femur. Total fluoroscopy time was 1 minutes 57 seconds. The images demonstrate a comminuted distal femoral periprosthetic fracture with subsequent surgical plate and multiple screw fixation. Decreased fracture displacement and angulation. IMPRESSION: Intraoperative fluoroscopic assistance provided during surgical fixation of distal femur  fracture. Electronically Signed   By: Donavan Foil M.D.   On: 01/04/2020 17:42   DG Hip Unilat W or Wo Pelvis 2-3 Views Right  Result Date: 01/03/2020 CLINICAL DATA:  [place], right knee pain EXAM: DG HIP (WITH OR WITHOUT PELVIS) 2-3V RIGHT COMPARISON:  07/29/2017 FINDINGS: Frontal view of the pelvis as well as frontal and cross-table lateral views of the right hip are obtained. Portions of the distal margin of the femoral prosthesis are excluded on the lateral view by collimation. The right hip appears in normal anatomic alignment without change since prior study. No acute pelvic fractures. Mild left hip osteoarthritis. Postsurgical changes are seen at L4/L5. IMPRESSION: 1. No acute pelvic or right hip fracture. Electronically Signed   By: Randa Ngo M.D.   On: 01/03/2020 20:14   DG FEMUR, MIN 2 VIEWS RIGHT  Result Date: 01/04/2020 CLINICAL DATA:  Femur fracture EXAM: RIGHT FEMUR 2 VIEWS; DG C-ARM 1-60 MIN COMPARISON:  01/03/2020 FINDINGS: Nine low resolution intraoperative spot views of the right femur. Total fluoroscopy time was 1 minutes 57  seconds. The images demonstrate a comminuted distal femoral periprosthetic fracture with subsequent surgical plate and multiple screw fixation. Decreased fracture displacement and angulation. IMPRESSION: Intraoperative fluoroscopic assistance provided during surgical fixation of distal femur fracture. Electronically Signed   By: Donavan Foil M.D.   On: 01/04/2020 17:42   XR Foot 2 Views Left  Result Date: 12/21/2019 Juxta-articular osteopenia was noted.  First MTP, PIP and DIP narrowing was noted.  Intertarsal and metatarsal tarsal joint space narrowing was noted.  No erosive changes were noted.  Dorsal spur was noted. 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  XR Foot 2 Views Right  Result Date: 12/21/2019 PIP and DIP narrowing was noted.  First MTP narrowing was noted.  No erosive changes were noted.  Juxta-articular osteopenia was noted.  Intertarsal joint space narrowing was noted.  Subtalar joint space narrowing was noted.  Inferior calcaneal spur was noted. 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  XR Hand 2 View Left  Result Date: 12/21/2019 Juxta-articular osteopenia was noted.  Narrowing of all MCP joints, PIP and DIP joints was noted.  Subluxation of fourth PIP joint was noted.  Severe intercarpal and radiocarpal joint space narrowing was noted.  No erosive changes were noted. 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  XR Hand 2 View Right  Result Date: 12/21/2019 Juxta-articular osteopenia was noted.  Narrowing of MCP joints, PIP and DIP joints was noted.  Severe metacarpocarpal intercarpal and radiocarpal joint space narrowing was noted.  Possible erosive change was noted in the carpal bones.  No comparison films were available today. Impression: These findings are consistent with rheumatoid arthritis and osteoarthritis  overlap.     Subjective: Feeling well, denies nausea today. She is eating better.   Discharge Exam: Vitals:   01/09/20 2031 01/10/20 0356  BP: (!) 125/57 (!) 125/51  Pulse: 78 68  Resp: 15 14  Temp: 98.1 F (36.7 C) 98.5 F (36.9 C)  SpO2: 96% 97%     General: Pt is alert, awake, not in acute distress Cardiovascular: RRR, S1/S2 +, no rubs, no gallops Respiratory: CTA bilaterally, no wheezing, no rhonchi Abdominal: Soft, NT, ND, bowel sounds + Extremities: no edema, no cyanosis    The results of significant diagnostics from this hospitalization (including imaging, microbiology, ancillary and laboratory) are listed below for reference.     Microbiology:  Recent Results (from the past 240 hour(s))  SARS Coronavirus 2 by RT PCR (hospital order, performed in [facility] hospital lab) Nasopharyngeal Nasopharyngeal Swab     Status: None   Collection Time: 01/03/20  8:56 PM   Specimen: Nasopharyngeal Swab  Result Value Ref Range Status   SARS Coronavirus 2 NEGATIVE NEGATIVE Final    Comment: (NOTE) SARS-CoV-2 target nucleic acids are NOT DETECTED.  The SARS-CoV-2 RNA is generally detectable in upper and lower respiratory specimens during the acute phase of infection. The lowest concentration of SARS-CoV-2 viral copies this assay can detect is 250 copies / mL. A negative result does not preclude SARS-CoV-2 infection and should not be used as the sole basis for treatment or other patient management decisions.  A negative result may occur with improper specimen collection / handling, submission of specimen other than nasopharyngeal swab, presence of viral mutation(s) within the areas targeted by this assay, and inadequate number of viral copies (<250 copies / mL). A negative result must be combined with clinical observations, patient history, and epidemiological information.  Fact Sheet for Patients:   StrictlyIdeas.no  Fact Sheet for Healthcare  Providers: BankingDealers.co.za  This test is not yet approved or  cleared by the [place] FDA and has been authorized for detection and/or diagnosis of SARS-CoV-2 by FDA under an Emergency Use Authorization (EUA).  This EUA will remain in effect (meaning this test can be used) for the duration of the COVID-19 declaration under Section 564(b)(1) of the Act, 21 U.S.C. section 360bbb-3(b)(1), unless the authorization is terminated or revoked sooner.  Performed at [facility], [place] [address] [place], [place] [address]   SARS CORONAVIRUS 2 (TAT 6-24 HRS) Nasopharyngeal Nasopharyngeal Swab     Status: None   Collection Time: 01/09/20  5:05 PM   Specimen: Nasopharyngeal Swab  Result Value Ref Range Status   SARS Coronavirus 2 NEGATIVE NEGATIVE Final    Comment: (NOTE) SARS-CoV-2 target nucleic acids are NOT DETECTED.  The SARS-CoV-2 RNA is generally detectable in upper and lower respiratory specimens during the acute phase of infection. Negative results do not preclude SARS-CoV-2 infection, do not rule out co-infections with other pathogens, and should not be used as the sole basis for treatment or other patient management decisions. Negative results must be combined with clinical observations, patient history, and epidemiological information. The expected result is Negative.  Fact Sheet for Patients: SugarRoll.be  Fact Sheet for Healthcare Providers: https://www.woods-mathews.com/  This test is not yet approved or cleared by the [place] FDA and  has been authorized for detection and/or diagnosis of SARS-CoV-2 by FDA under an Emergency Use Authorization (EUA). This EUA will remain  in effect (meaning this test can be used) for the duration of the COVID-19 declaration under Se ction 564(b)(1) of the Act, 21 U.S.C. section 360bbb-3(b)(1), unless the authorization is terminated or revoked  sooner.  Performed at [facility], [place] [address], [place], [place] [address]      Labs: BNP (last 3 results) No results for input(s): BNP in the last 8760 hours. Basic Metabolic Panel: Recent Labs  Lab 01/05/20 0342 01/05/20 1146 01/06/20 0844 01/07/20 0324 01/09/20 1211  NA 140 137 137 137 137  K 5.7* 4.6 4.5 4.8 5.0  CL 106 105 107 108 101  CO2 25 25 24 22 28   GLUCOSE 147* 129* 92 101* 114*  BUN 31* 32* 30* 28* 20  CREATININE 2.03* 1.91* 1.59* 1.41* 1.16*  CALCIUM 8.5* 7.8* 8.1* 8.2*  8.7*   Liver Function Tests: No results for input(s): AST, ALT, ALKPHOS, BILITOT, PROT, ALBUMIN in the last 168 hours. No results for input(s): LIPASE, AMYLASE in the last 168 hours. No results for input(s): AMMONIA in the last 168 hours. CBC: Recent Labs  Lab 01/03/20 2056 01/04/20 0439 01/04/20 2039 01/04/20 2039 01/05/20 0342 01/06/20 0844 01/07/20 0324 01/08/20 1108 01/09/20 0350  WBC 18.4*   < > 13.4*  --  9.5 10.5 10.2 8.3  --   NEUTROABS 16.1*  --   --   --   --   --   --   --   --   HGB 12.6   < > 11.1*   < > 10.0* 8.5* 8.6* 8.3* 8.4*  HCT 40.3   < > 36.0   < > 33.3* 27.9* 28.5* 27.5* 27.6*  MCV 92.6   < > 93.3  --  95.7 95.5 95.6 94.8  --   PLT 240   < > 212  --  192 180 195 183  --    < > = values in this interval not displayed.   Cardiac Enzymes: No results for input(s): CKTOTAL, CKMB, CKMBINDEX, TROPONINI in the last 168 hours. BNP: Invalid input(s): POCBNP CBG: No results for input(s): GLUCAP in the last 168 hours. D-Dimer No results for input(s): DDIMER in the last 72 hours. Hgb A1c No results for input(s): HGBA1C in the last 72 hours. Lipid Profile No results for input(s): CHOL, HDL, LDLCALC, TRIG, CHOLHDL, LDLDIRECT in the last 72 hours. Thyroid function studies No results for input(s): TSH, T4TOTAL, T3FREE, THYROIDAB in the last 72 hours.  Invalid input(s): FREET3 Anemia work up No results for input(s): VITAMINB12, FOLATE, FERRITIN, TIBC,  IRON, RETICCTPCT in the last 72 hours. Urinalysis    Component Value Date/Time   COLORURINE YELLOW 01/05/2020 1640   APPEARANCEUR CLEAR 01/05/2020 1640   LABSPEC 1.013 01/05/2020 1640   PHURINE 5.0 01/05/2020 1640   GLUCOSEU NEGATIVE 01/05/2020 1640   HGBUR NEGATIVE 01/05/2020 1640   BILIRUBINUR NEGATIVE 01/05/2020 1640   KETONESUR NEGATIVE 01/05/2020 1640   PROTEINUR NEGATIVE 01/05/2020 1640   UROBILINOGEN 0.2 08/25/2012 1408   NITRITE NEGATIVE 01/05/2020 1640   LEUKOCYTESUR NEGATIVE 01/05/2020 1640   Sepsis Labs Invalid input(s): PROCALCITONIN,  WBC,  LACTICIDVEN Microbiology Recent Results (from the past 240 hour(s))  SARS Coronavirus 2 by RT PCR (hospital order, performed in [place] hospital lab) Nasopharyngeal Nasopharyngeal Swab     Status: None   Collection Time: 01/03/20  8:56 PM   Specimen: Nasopharyngeal Swab  Result Value Ref Range Status   SARS Coronavirus 2 NEGATIVE NEGATIVE Final    Comment: (NOTE) SARS-CoV-2 target nucleic acids are NOT DETECTED.  The SARS-CoV-2 RNA is generally detectable in upper and lower respiratory specimens during the acute phase of infection. The lowest concentration of SARS-CoV-2 viral copies this assay can detect is 250 copies / mL. A negative result does not preclude SARS-CoV-2 infection and should not be used as the sole basis for treatment or other patient management decisions.  A negative result may occur with improper specimen collection / handling, submission of specimen other than nasopharyngeal swab, presence of viral mutation(s) within the areas targeted by this assay, and inadequate number of viral copies (<250 copies / mL). A negative result must be combined with clinical observations, patient history, and epidemiological information.  Fact Sheet for Patients:   StrictlyIdeas.no  Fact Sheet for Healthcare Providers: BankingDealers.co.za  This test is not yet approved or  cleared by the [place] and has been authorized for detection and/or diagnosis of SARS-CoV-2 by FDA under an Emergency Use Authorization (EUA).  This EUA will remain in effect (meaning this test can be used) for the duration of the COVID-19 declaration under Section 564(b)(1) of the Act, 21 U.S.C. section 360bbb-3(b)(1), unless the authorization is terminated or revoked sooner.  Performed at [facility], [place] [address], [place], [place] [address]   SARS CORONAVIRUS 2 (TAT 6-24 HRS) Nasopharyngeal Nasopharyngeal Swab     Status: None   Collection Time: 01/09/20  5:05 PM   Specimen: Nasopharyngeal Swab  Result Value Ref Range Status   SARS Coronavirus 2 NEGATIVE NEGATIVE Final    Comment: (NOTE) SARS-CoV-2 target nucleic acids are NOT DETECTED.  The SARS-CoV-2 RNA is generally detectable in upper and lower respiratory specimens during the acute phase of infection. Negative results do not preclude SARS-CoV-2 infection, do not rule out co-infections with other pathogens, and should not be used as the sole basis for treatment or other patient management decisions. Negative results must be combined with clinical observations, patient history, and epidemiological information. The expected result is Negative.  Fact Sheet for Patients: SugarRoll.be  Fact Sheet for Healthcare Providers: https://www.woods-mathews.com/  This test is not yet approved or cleared by the [place] FDA and  has been authorized for detection and/or diagnosis of SARS-CoV-2 by FDA under an Emergency Use Authorization (EUA). This EUA will remain  in effect (meaning this test can be used) for the duration of the COVID-19 declaration under Se ction 564(b)(1) of the Act, 21 U.S.C. section 360bbb-3(b)(1), unless the authorization is terminated or revoked sooner.  Performed at [facility], [place] [address] [place], [place] [address]       Time coordinating discharge: 40 minutes  SIGNED:   Elmarie Shiley, MD  [facility]

## 2020-01-11 ENCOUNTER — Non-Acute Institutional Stay (SKILLED_NURSING_FACILITY): Payer: PPO | Admitting: Adult Health

## 2020-01-11 ENCOUNTER — Inpatient Hospital Stay
Admission: RE | Admit: 2020-01-11 | Discharge: 2020-01-31 | Disposition: A | Discharge status: Home / Self Care | Payer: PPO | Source: Ambulatory Visit | Attending: Internal Medicine | Admitting: Internal Medicine

## 2020-01-11 DIAGNOSIS — D1771 Benign lipomatous neoplasm of kidney: Secondary | ICD-10-CM | POA: Diagnosis not present

## 2020-01-11 DIAGNOSIS — I1310 Hypertensive heart and chronic kidney disease without heart failure, with stage 1 through stage 4 chronic kidney disease, or unspecified chronic kidney disease: Secondary | ICD-10-CM | POA: Diagnosis not present

## 2020-01-11 DIAGNOSIS — S72451A Displaced supracondylar fracture without intracondylar extension of lower end of right femur, initial encounter for closed fracture: Secondary | ICD-10-CM

## 2020-01-11 DIAGNOSIS — M159 Polyosteoarthritis, unspecified: Secondary | ICD-10-CM | POA: Diagnosis not present

## 2020-01-11 DIAGNOSIS — F411 Generalized anxiety disorder: Secondary | ICD-10-CM | POA: Diagnosis not present

## 2020-01-11 DIAGNOSIS — I959 Hypotension, unspecified: Secondary | ICD-10-CM | POA: Diagnosis not present

## 2020-01-11 DIAGNOSIS — Z7401 Bed confinement status: Secondary | ICD-10-CM | POA: Diagnosis not present

## 2020-01-11 DIAGNOSIS — M2550 Pain in unspecified joint: Secondary | ICD-10-CM | POA: Diagnosis not present

## 2020-01-11 DIAGNOSIS — D62 Acute posthemorrhagic anemia: Secondary | ICD-10-CM | POA: Diagnosis not present

## 2020-01-11 DIAGNOSIS — G9009 Other idiopathic peripheral autonomic neuropathy: Secondary | ICD-10-CM | POA: Diagnosis not present

## 2020-01-11 DIAGNOSIS — M797 Fibromyalgia: Secondary | ICD-10-CM | POA: Diagnosis not present

## 2020-01-11 DIAGNOSIS — M0589 Other rheumatoid arthritis with rheumatoid factor of multiple sites: Secondary | ICD-10-CM | POA: Diagnosis not present

## 2020-01-11 DIAGNOSIS — N183 Chronic kidney disease, stage 3 unspecified: Secondary | ICD-10-CM

## 2020-01-11 DIAGNOSIS — N1330 Unspecified hydronephrosis: Secondary | ICD-10-CM | POA: Diagnosis not present

## 2020-01-11 DIAGNOSIS — S72451D Displaced supracondylar fracture without intracondylar extension of lower end of right femur, subsequent encounter for closed fracture with routine healing: Secondary | ICD-10-CM

## 2020-01-11 DIAGNOSIS — F329 Major depressive disorder, single episode, unspecified: Secondary | ICD-10-CM | POA: Diagnosis not present

## 2020-01-11 DIAGNOSIS — J9601 Acute respiratory failure with hypoxia: Secondary | ICD-10-CM | POA: Diagnosis not present

## 2020-01-11 DIAGNOSIS — L03115 Cellulitis of right lower limb: Secondary | ICD-10-CM | POA: Diagnosis not present

## 2020-01-11 DIAGNOSIS — M21372 Foot drop, left foot: Secondary | ICD-10-CM | POA: Diagnosis not present

## 2020-01-11 DIAGNOSIS — K5909 Other constipation: Secondary | ICD-10-CM | POA: Diagnosis not present

## 2020-01-11 DIAGNOSIS — Z4789 Encounter for other orthopedic aftercare: Secondary | ICD-10-CM | POA: Diagnosis not present

## 2020-01-11 DIAGNOSIS — N1832 Chronic kidney disease, stage 3b: Secondary | ICD-10-CM | POA: Diagnosis not present

## 2020-01-11 DIAGNOSIS — R339 Retention of urine, unspecified: Secondary | ICD-10-CM | POA: Diagnosis not present

## 2020-01-11 DIAGNOSIS — I119 Hypertensive heart disease without heart failure: Secondary | ICD-10-CM

## 2020-01-11 DIAGNOSIS — Z96651 Presence of right artificial knee joint: Secondary | ICD-10-CM | POA: Diagnosis not present

## 2020-01-11 DIAGNOSIS — M0579 Rheumatoid arthritis with rheumatoid factor of multiple sites without organ or systems involvement: Secondary | ICD-10-CM

## 2020-01-11 DIAGNOSIS — Z741 Need for assistance with personal care: Secondary | ICD-10-CM | POA: Diagnosis not present

## 2020-01-11 DIAGNOSIS — E785 Hyperlipidemia, unspecified: Secondary | ICD-10-CM | POA: Diagnosis not present

## 2020-01-11 DIAGNOSIS — M9711XD 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, subsequent encounter: Secondary | ICD-10-CM | POA: Diagnosis not present

## 2020-01-11 DIAGNOSIS — R262 Difficulty in walking, not elsewhere classified: Secondary | ICD-10-CM | POA: Diagnosis not present

## 2020-01-11 DIAGNOSIS — G6289 Other specified polyneuropathies: Secondary | ICD-10-CM | POA: Diagnosis not present

## 2020-01-11 DIAGNOSIS — I10 Essential (primary) hypertension: Secondary | ICD-10-CM | POA: Diagnosis not present

## 2020-01-11 DIAGNOSIS — M6281 Muscle weakness (generalized): Secondary | ICD-10-CM | POA: Diagnosis not present

## 2020-01-11 LAB — CREATININE, SERUM
Creatinine, Ser: 1.17 mg/dL — ABNORMAL HIGH (ref 0.44–1.00)
GFR calc Af Amer: 51 mL/min — ABNORMAL LOW (ref >60)
GFR calc non Af Amer: 44 mL/min — ABNORMAL LOW (ref >60)

## 2020-01-11 NOTE — Progress Notes (Signed)
Discharge summary packet/pertinent documents provided to [facility] staff.

## 2020-01-11 NOTE — Progress Notes (Signed)
Report given to [facility] stafff nurse at  [facility] center. All questions and concerns were answerred satisfactorily.

## 2020-01-11 NOTE — Plan of Care (Signed)
  Problem: Education: Goal: Knowledge of General Education information will improve Description: Including pain rating scale, medication(s)/side effects and non-pharmacologic comfort measures Outcome: Adequate for Discharge   Problem: Health Behavior/Discharge Planning: Goal: Ability to manage health-related needs will improve Outcome: Adequate for Discharge   Problem: Activity: Goal: Risk for activity intolerance will decrease Outcome: Adequate for Discharge   Problem: Nutrition: Goal: Adequate nutrition will be maintained Outcome: Adequate for Discharge   Problem: Coping: Goal: Level of anxiety will decrease Outcome: Adequate for Discharge   Problem: Elimination: Goal: Will not experience complications related to bowel motility Outcome: Adequate for Discharge   Problem: Pain Managment: Goal: General experience of comfort will improve Outcome: Adequate for Discharge   Problem: Skin Integrity: Goal: Risk for impaired skin integrity will decrease Outcome: Adequate for Discharge

## 2020-01-11 NOTE — TOC Transition Note (Signed)
Transition of Care [facility]) - CM/SW Discharge Note   Patient Details  Name: Laura Chambers MRN: [id number] Date of Birth: [birth year]  Transition [facility]) CM/SW Contact:  Sharin Mons, RN Phone Number: [phone] 01/11/2020, 9:10 AM   Clinical Narrative:     Patient will DC to: [facility] Anticipated DC date: 01/11/2020 Family notified: PT states will make son aware Transport by: Corey Harold   Per MD patient ready for DC today . RN, patient, patient's family, and facility notified of DC. Discharge Summary and FL2 sent to facility. RN to call report prior to discharge [phone]). DC packet on chart. Ambulance transport requested for patient.   RNCM will sign off for now as intervention is no longer needed. Please consult [place] again if new needs arise.   Final next level of care: [facility]) Barriers to Discharge: No Barriers Identified   Patient Goals and CMS Choice Patient states their goals for this hospitalization and ongoing recovery are:: Rehab CMS Medicare.gov Compare Post Acute Care list provided to:: Patient Choice offered to / list presented to : Patient, Adult Children  Discharge Placement                       Discharge Plan and Services In-house Referral: Clinical Social Work Discharge Planning Services: CM Consult Post Acute Care Choice: [place]          DME Arranged: N/A DME Agency: NA       HH Arranged: PT [place] Agency: [facility] (now [facility]) Date [place]: 01/05/20   Representative spoke with at [place]: [place] ([place]) Interventions     Readmission Risk Interventions No flowsheet data found.

## 2020-01-11 NOTE — Progress Notes (Signed)
Nutrition Follow-up  DOCUMENTATION CODES:   Obesity unspecified  INTERVENTION:   -Continue MVI with minerals daily -Continue Ensure Enlive po daily, each supplement provides 350 kcal and 20 grams of protein  NUTRITION DIAGNOSIS:   Increased nutrient needs related to post-op healing as evidenced by estimated needs.  Ongoing  GOAL:   Patient will meet greater than or equal to 90% of their needs  Progressing  MONITOR:   PO intake, Supplement acceptance, Diet advancement, Labs, Weight trends, Skin, I & O's  REASON FOR ASSESSMENT:   Consult Hip fracture protocol  ASSESSMENT:   Laura Chambers is a 79 y.o. female with history of rheumatoid arthritis hypertension hyperlipidemia presents to the ER after patient had a fall.  6/30- s/p Open reduction internal fixation of right distal femur fracture  Reviewed I/O's: -560 ml x 24 hours and -2.6 L since admission  UOP: 800 ml x 24 hours  Attempted to reach pt via phone, however, no answer.   Pt with good appetite, consuming 100% of meals. She is refusing Ensure supplements.   Per TOC notes, pt medically stable for discharge, however, awaiting SNF placement.   Labs reviewed.   Diet Order:   Diet Order            Diet - low sodium heart healthy           Diet Heart Room service appropriate? Yes; Fluid consistency: Thin  Diet effective now                 EDUCATION NEEDS:   Not appropriate for education at this time  Skin:  Skin Assessment: Skin Integrity Issues: Skin Integrity Issues:: Incisions Incisions: closed rt leg  Last BM:  01/09/20  Height:   Ht Readings from Last 1 Encounters:  01/04/20 5\' 7"  (1.702 m)    Weight:   Wt Readings from Last 1 Encounters:  01/04/20 90.7 kg    Ideal Body Weight:  61.4 kg  BMI:  Body mass index is 31.32 kg/m.  Estimated Nutritional Needs:   Kcal:  1850-2050  Protein:  95-110 grams  Fluid:  > 1.8 L    Loistine Chance, RD, LDN, [place] Registered Dietitian  II Certified Diabetes Care and Education Specialist Please refer to [facility] for RD and/or RD on-call/weekend/after hours pager

## 2020-01-11 NOTE — Discharge Summary (Signed)
Physician Discharge Summary  HANEEN BERNALES ZSM:[id number] DOB: 1940/11/01 DOA: 01/03/2020  PCP: Leamon Arnt, MD  Admit date: 01/03/2020 Discharge date: 01/11/2020  Admitted From: Home  Disposition:  SNF  Recommendations for Outpatient Follow-up:  1. Follow up with PCP in 1-2 weeks 2. Please obtain BMP/CBC in one week 3. Needs follow up on Hb level.  4. Follow up with Ortho as scheduled 5. Hold Plaquenil for 4 weeks post op.   No changes overnight - patient remains stable for DC  Discharge Condition: Stable.  CODE STATUS: Full code Diet recommendation: Heart Healthy   Brief/Interim Summary: 79 year old with past medical history significant for rheumatoid arthritis, hypertension, hyperlipidemia presented to the ER after a mechanical fall.  Patient states that she has a left foot drop and recently was diagnosed with problems with her right foot arch by her rheumatologist.  She walks with help of her walker.  She slipped and fell.  X-ray in the ED shows features concerning for right periprosthetic knee fracture.  Patient underwent open reduction and internal fixation of right distal femoral fracture by Dr. Doreatha Martin on 6/30.   1-Right knee periprostatic fracture: Orthopedic consulted and following Patient underwent open reduction and internal fixation of right distal femoral fracture by Dr. Doreatha Martin on 6/30 PT OT per surgery DVT prophylaxis: Lovenox Received antibiotics prophylaxis Pain controlled.  Agree to go to Rehab.  Discussed case with PA, Ricci Barker, they think patient will benefit from rehab for 2-3 weeks, after that she will probably be able to go home with [facility] PT>   2-Hypertension: Continue with atenolol.  Hold Norvasc due to soft blood pressure to avoid further worsening of renal function Well controlled.   3-Hyperlipidemia: Continue with statins  4-Rheumatoid arthritis: per orhto hold plaquenil for 4 weeks. Post surgery   5-AKI on CKD stage IIIb: Creatinine  baseline 1.5. Worsening renal function post surgery , suspect related To hypovolemia hypotension. Monitor urine output, bladder scan. Back to baseline. Stop IV fluids.   6-Hyperkalemia; Patient was getting IV fluids with potassium supplementation.  I have changed fluids to normal saline. EKG without peak T wave.  Received a dose of Lokelma K at 5, will give another dose of lokelma  7-Acute blood loss anemia, post surgery expected.  Hemoglobin trending down today at 8.  Stop fluids . Asymptomatic. No indication for transfusion at this time Hb stable. 8 range. Started iron supplement.   8-Leukocytosis: Secondary to acute trauma: Resolved 9-Acute hypoxic respiratory failure: Plus surgery requiring 2 L of oxygen. Incentive spirometry Careful with sedatives. Off oxygen.  Check oxygen sat overnight. Might required oxygen while sleep.  Continue with oxygen at bedtime   10-Constipation; didn't tolerated dulcolax. Had BM after fleet enema 11-Nausea; after she got up and out of bed. EKG no acute changes. Gas x and zofran PRN> Resolved.    Nutrition Problem: Increased nutrient needs Etiology: post-op healing    Discharge Diagnoses:  Principal Problem:   Supracondylar fracture of right femur ([place]) Active Problems:   Dyslipidemia   Essential hypertension   Obesity, Class III, BMI 40-49.9 (morbid obesity) (HCC)   Rheumatoid arthritis involving multiple sites with positive rheumatoid factor (HCC)   Femur fracture, right (HCC)   Acute blood loss anemia    Discharge Instructions  Discharge Instructions    Diet - low sodium heart healthy   Complete by: As directed    Increase activity slowly   Complete by: As directed    No wound care   Complete by:  As directed      Allergies as of 01/11/2020      Reactions   Ativan [lorazepam] Other (See Comments)   Highly irritable   Haldol [haloperidol] Other (See Comments)   Vegetative State   Epinephrine Other (See Comments)    Reaction:  Increased pts BP and caused shoulder to jerk    Levofloxacin Nausea And Vomiting   Lipitor [atorvastatin] Other (See Comments)   Reaction:  Joint pain and dizziness    Trovan [alatrofloxacin] Nausea And Vomiting   Pt states that this med caused pancreatitis.      Medication List    STOP taking these medications   amLODipine 10 MG tablet Commonly known as: NORVASC   GINGER ROOT PO   hydroxychloroquine 200 MG tablet Commonly known as: PLAQUENIL     TAKE these medications   atenolol 50 MG tablet Commonly known as: TENORMIN TAKE 1 TABLET BY MOUTH EVERY DAY   Calcium 600+D 600-400 MG-UNIT tablet Generic drug: Calcium Carbonate-Vitamin D Take 1 tablet by mouth every other day.   docusate sodium 100 MG capsule Commonly known as: COLACE Take 1 capsule (100 mg total) by mouth 2 (two) times daily.   enoxaparin 30 MG/0.3ML injection Commonly known as: LOVENOX Inject 0.3 mLs (30 mg total) into the skin daily.   ferrous sulfate 325 (65 FE) MG EC tablet Take 325 mg by mouth daily with breakfast.   FLUoxetine 20 MG capsule Commonly known as: PROZAC TAKE 1 CAPSULE BY MOUTH EVERY DAY IN THE MORNING What changed: See the new instructions.   folic acid 440 MCG tablet Commonly known as: FOLVITE Take 800 mcg daily by mouth.   furosemide 40 MG tablet Commonly known as: LASIX Take 1 tablet (40 mg total) daily as needed by mouth for edema. What changed: Another medication with the same name was removed. Continue taking this medication, and follow the directions you see here.   gabapentin 300 MG capsule Commonly known as: NEURONTIN TAKE 1 CAPSULE BY MOUTH 2 TIMES DAILY   HYDROcodone-acetaminophen 5-325 MG tablet Commonly known as: NORCO/VICODIN Take 1-2 tablets by mouth every 4 (four) hours as needed for moderate pain or severe pain.   methocarbamol 500 MG tablet Commonly known as: ROBAXIN Take 1 tablet (500 mg total) by mouth every 6 (six) hours as needed for muscle  spasms.   multivitamin with minerals Tabs tablet Take 1 tablet by mouth daily.   Omega-3 1000 MG Caps Take 1 capsule by mouth daily.   polyethylene glycol 17 g packet Commonly known as: MIRALAX / GLYCOLAX Take 17 g by mouth 2 (two) times daily.   promethazine 25 MG tablet Commonly known as: PHENERGAN Take 1 tablet (25 mg total) by mouth every 8 (eight) hours as needed for nausea or vomiting.   rosuvastatin 10 MG tablet Commonly known as: CRESTOR TAKE 1 TABLET BY MOUTH EVERY DAY   tamsulosin 0.4 MG Caps capsule Commonly known as: FLOMAX Take 0.4 mg by mouth daily as needed (urination).   TART CHERRY ADVANCED PO Take 1 tablet by mouth daily.   Turmeric 400 MG Caps Take 1 capsule by mouth daily.       Follow-up Information    [facility] Follow up.   Why: home health services arranged, [place] 7/7       Haddix, Thomasene Lot, MD. Schedule an appointment as soon as possible for a visit in 2 week(s).   Specialty: Orthopedic Surgery Why: for repeat x-rays and wound check Contact information: [address] [address] [phone]              Allergies  Allergen Reactions  . Ativan [Lorazepam] Other (See Comments)    Highly irritable  . Haldol [Haloperidol] Other (See Comments)    Vegetative State  . Epinephrine Other (See Comments)    Reaction:  Increased pts BP and caused shoulder to jerk   . Levofloxacin Nausea And Vomiting  . Lipitor [Atorvastatin] Other (See Comments)    Reaction:  Joint pain and dizziness   . Trovan [Alatrofloxacin] Nausea And Vomiting    Pt states that this med caused pancreatitis.    Consultations:  Dr Doreatha Martin   Procedures/Studies: DG Chest 1 View  Result Date: 01/03/2020 CLINICAL DATA:  Preoperative assessment for right femur fracture, hypertension EXAM: CHEST  1 VIEW COMPARISON:  06/09/2017 FINDINGS: Single frontal view of the chest demonstrates a stable cardiac silhouette. Mild atherosclerosis of the aortic  arch. No airspace disease, effusion, or pneumothorax. Surgical clips at the gastroesophageal junction. No acute bony abnormalities. IMPRESSION: 1. No acute intrathoracic process. Electronically Signed   By: Randa Ngo M.D.   On: 01/03/2020 20:58   DG Tibia/Fibula Right  Result Date: 01/03/2020 CLINICAL DATA:  Right knee pain after fall EXAM: RIGHT TIBIA AND FIBULA - 2 VIEW COMPARISON:  07/29/2017 FINDINGS: Frontal and lateral views of the right tibia and fibula are obtained. Portions of the right knee arthroplasty are excluded by collimation but are fully evaluated on the corresponding right knee evaluation. I do not see any tibial or fibular fractures. There is diffuse subcutaneous edema. IMPRESSION: 1. No acute fracture of the right tibia or fibula. Electronically Signed   By: Randa Ngo M.D.   On: 01/03/2020 20:14   DG Knee Complete 4 Views Right  Result Date: 01/03/2020 CLINICAL DATA:  [place] today, right knee pain EXAM: RIGHT KNEE - COMPLETE 4+ VIEW COMPARISON:  07/29/2017 FINDINGS: Frontal, bilateral oblique, lateral views of the right knee are obtained. There is a comminuted displaced periprosthetic fracture of the distal right femur. There is approximately 2.4 cm of dorsal displacement of the distal fracture fragment. The femoral and tibial components of the arthroplasty remain well aligned. No other bony abnormalities. There is diffuse soft tissue swelling. IMPRESSION: 1. Comminuted and displaced periprosthetic distal right femur fracture. Electronically Signed   By: Randa Ngo M.D.   On: 01/03/2020 20:13   DG Knee Right Port  Result Date: 01/04/2020 CLINICAL DATA:  Periprosthetic right femoral fracture, ORIF EXAM: PORTABLE RIGHT KNEE - 1-2 VIEW COMPARISON:  01/04/2020, 01/03/2020 FINDINGS: Frontal and lateral views of the right knee demonstrate lateral plate and screw fixation traversing the comminuted distal right femoral periprosthetic fracture seen previously. The superior margin  of the fusion plate is excluded by collimation. Alignment is near anatomic. Right knee is well aligned. Postsurgical changes are seen in the soft tissues. IMPRESSION: 1. ORIF of a periprosthetic right femoral fracture as above. Electronically Signed   By: Randa Ngo M.D.   On: 01/04/2020 20:12   DG C-Arm 1-60 Min  Result Date: 01/04/2020 CLINICAL DATA:  Femur fracture EXAM: RIGHT FEMUR 2 VIEWS; DG C-ARM 1-60 MIN COMPARISON:  01/03/2020 FINDINGS: Nine low resolution intraoperative spot views of the right femur. Total fluoroscopy time was 1 minutes 57 seconds. The images demonstrate a comminuted distal femoral periprosthetic fracture with subsequent surgical plate and multiple screw fixation. Decreased fracture displacement and angulation. IMPRESSION: Intraoperative fluoroscopic assistance provided during surgical fixation of distal femur fracture. Electronically  Signed   By: Donavan Foil M.D.   On: 01/04/2020 17:42   DG Hip Unilat W or Wo Pelvis 2-3 Views Right  Result Date: 01/03/2020 CLINICAL DATA:  [place], right knee pain EXAM: DG HIP (WITH OR WITHOUT PELVIS) 2-3V RIGHT COMPARISON:  07/29/2017 FINDINGS: Frontal view of the pelvis as well as frontal and cross-table lateral views of the right hip are obtained. Portions of the distal margin of the femoral prosthesis are excluded on the lateral view by collimation. The right hip appears in normal anatomic alignment without change since prior study. No acute pelvic fractures. Mild left hip osteoarthritis. Postsurgical changes are seen at L4/L5. IMPRESSION: 1. No acute pelvic or right hip fracture. Electronically Signed   By: Randa Ngo M.D.   On: 01/03/2020 20:14   DG FEMUR, MIN 2 VIEWS RIGHT  Result Date: 01/04/2020 CLINICAL DATA:  Femur fracture EXAM: RIGHT FEMUR 2 VIEWS; DG C-ARM 1-60 MIN COMPARISON:  01/03/2020 FINDINGS: Nine low resolution intraoperative spot views of the right femur. Total fluoroscopy time was 1 minutes 57 seconds. The images  demonstrate a comminuted distal femoral periprosthetic fracture with subsequent surgical plate and multiple screw fixation. Decreased fracture displacement and angulation. IMPRESSION: Intraoperative fluoroscopic assistance provided during surgical fixation of distal femur fracture. Electronically Signed   By: Donavan Foil M.D.   On: 01/04/2020 17:42   XR Foot 2 Views Left  Result Date: 12/21/2019 Juxta-articular osteopenia was noted.  First MTP, PIP and DIP narrowing was noted.  Intertarsal and metatarsal tarsal joint space narrowing was noted.  No erosive changes were noted.  Dorsal spur was noted. 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  XR Foot 2 Views Right  Result Date: 12/21/2019 PIP and DIP narrowing was noted.  First MTP narrowing was noted.  No erosive changes were noted.  Juxta-articular osteopenia was noted.  Intertarsal joint space narrowing was noted.  Subtalar joint space narrowing was noted.  Inferior calcaneal spur was noted. 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  XR Hand 2 View Left  Result Date: 12/21/2019 Juxta-articular osteopenia was noted.  Narrowing of all MCP joints, PIP and DIP joints was noted.  Subluxation of fourth PIP joint was noted.  Severe intercarpal and radiocarpal joint space narrowing was noted.  No erosive changes were noted. 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  XR Hand 2 View Right  Result Date: 12/21/2019 Juxta-articular osteopenia was noted.  Narrowing of MCP joints, PIP and DIP joints was noted.  Severe metacarpocarpal intercarpal and radiocarpal joint space narrowing was noted.  Possible erosive change was noted in the carpal bones.  No comparison films were available today. Impression: These findings are consistent with rheumatoid arthritis and osteoarthritis  overlap.    Subjective: Feeling well, denies nausea today. She is eating better.   Discharge Exam: Vitals:   01/10/20 1941 01/11/20 0405  BP: (!) 132/52 (!) 145/62  Pulse: 73 69  Resp: 16 15  Temp: 98.4 F (36.9 C) 98.1 F (36.7 C)  SpO2: 97% 97%     General: Pt is alert, awake, not in acute distress Cardiovascular: RRR, S1/S2 +, no rubs, no gallops Respiratory: CTA bilaterally, no wheezing, no rhonchi Abdominal: Soft, NT, ND, bowel sounds + Extremities: no edema, no cyanosis    The results of significant diagnostics from this hospitalization (including imaging, microbiology, ancillary and laboratory) are listed below for reference.     Microbiology: Recent Results (from  the past 240 hour(s))  SARS Coronavirus 2 by RT PCR (hospital order, performed in [facility] hospital lab) Nasopharyngeal Nasopharyngeal Swab     Status: None   Collection Time: 01/03/20  8:56 PM   Specimen: Nasopharyngeal Swab  Result Value Ref Range Status   SARS Coronavirus 2 NEGATIVE NEGATIVE Final    Comment: (NOTE) SARS-CoV-2 target nucleic acids are NOT DETECTED.  The SARS-CoV-2 RNA is generally detectable in upper and lower respiratory specimens during the acute phase of infection. The lowest concentration of SARS-CoV-2 viral copies this assay can detect is 250 copies / mL. A negative result does not preclude SARS-CoV-2 infection and should not be used as the sole basis for treatment or other patient management decisions.  A negative result may occur with improper specimen collection / handling, submission of specimen other than nasopharyngeal swab, presence of viral mutation(s) within the areas targeted by this assay, and inadequate number of viral copies (<250 copies / mL). A negative result must be combined with clinical observations, patient history, and epidemiological information.  Fact Sheet for Patients:   StrictlyIdeas.no  Fact Sheet for Healthcare  Providers: BankingDealers.co.za  This test is not yet approved or  cleared by the [place] FDA and has been authorized for detection and/or diagnosis of SARS-CoV-2 by FDA under an Emergency Use Authorization (EUA).  This EUA will remain in effect (meaning this test can be used) for the duration of the COVID-19 declaration under Section 564(b)(1) of the Act, 21 U.S.C. section 360bbb-3(b)(1), unless the authorization is terminated or revoked sooner.  Performed at [facility], [place] [address], [place], [place] [address]   SARS CORONAVIRUS 2 (TAT 6-24 HRS) Nasopharyngeal Nasopharyngeal Swab     Status: None   Collection Time: 01/09/20  5:05 PM   Specimen: Nasopharyngeal Swab  Result Value Ref Range Status   SARS Coronavirus 2 NEGATIVE NEGATIVE Final    Comment: (NOTE) SARS-CoV-2 target nucleic acids are NOT DETECTED.  The SARS-CoV-2 RNA is generally detectable in upper and lower respiratory specimens during the acute phase of infection. Negative results do not preclude SARS-CoV-2 infection, do not rule out co-infections with other pathogens, and should not be used as the sole basis for treatment or other patient management decisions. Negative results must be combined with clinical observations, patient history, and epidemiological information. The expected result is Negative.  Fact Sheet for Patients: SugarRoll.be  Fact Sheet for Healthcare Providers: https://www.woods-mathews.com/  This test is not yet approved or cleared by the [place] FDA and  has been authorized for detection and/or diagnosis of SARS-CoV-2 by FDA under an Emergency Use Authorization (EUA). This EUA will remain  in effect (meaning this test can be used) for the duration of the COVID-19 declaration under Se ction 564(b)(1) of the Act, 21 U.S.C. section 360bbb-3(b)(1), unless the authorization is terminated or revoked  sooner.  Performed at [facility], [place] [address] [place],  [address]      Labs: BNP (last 3 results) No results for input(s): BNP in the last 8760 hours. Basic Metabolic Panel: Recent Labs  Lab 01/05/20 0342 01/05/20 0342 01/05/20 1146 01/06/20 0844 01/07/20 0324 01/09/20 1211 01/11/20 0412  NA 140  --  137 137 137 137  --   K 5.7*  --  4.6 4.5 4.8 5.0  --   CL 106  --  105 107 108 101  --   CO2 25  --  25 24 22 28   --   GLUCOSE  147*  --  129* 92 101* 114*  --   BUN 31*  --  32* 30* 28* 20  --   CREATININE 2.03*   < > 1.91* 1.59* 1.41* 1.16* 1.17*  CALCIUM 8.5*  --  7.8* 8.1* 8.2* 8.7*  --    < > = values in this interval not displayed.   Liver Function Tests: No results for input(s): AST, ALT, ALKPHOS, BILITOT, PROT, ALBUMIN in the last 168 hours. No results for input(s): LIPASE, AMYLASE in the last 168 hours. No results for input(s): AMMONIA in the last 168 hours. CBC: Recent Labs  Lab 01/04/20 2039 01/04/20 2039 01/05/20 0342 01/06/20 0844 01/07/20 0324 01/08/20 1108 01/09/20 0350  WBC 13.4*  --  9.5 10.5 10.2 8.3  --   HGB 11.1*   < > 10.0* 8.5* 8.6* 8.3* 8.4*  HCT 36.0   < > 33.3* 27.9* 28.5* 27.5* 27.6*  MCV 93.3  --  95.7 95.5 95.6 94.8  --   PLT 212  --  192 180 195 183  --    < > = values in this interval not displayed.   Cardiac Enzymes: No results for input(s): CKTOTAL, CKMB, CKMBINDEX, TROPONINI in the last 168 hours. BNP: Invalid input(s): POCBNP CBG: No results for input(s): GLUCAP in the last 168 hours. D-Dimer No results for input(s): DDIMER in the last 72 hours. Hgb A1c No results for input(s): HGBA1C in the last 72 hours. Lipid Profile No results for input(s): CHOL, HDL, LDLCALC, TRIG, CHOLHDL, LDLDIRECT in the last 72 hours. Thyroid function studies No results for input(s): TSH, T4TOTAL, T3FREE, THYROIDAB in the last 72 hours.  Invalid input(s): FREET3 Anemia work up No results for input(s): VITAMINB12, FOLATE,  FERRITIN, TIBC, IRON, RETICCTPCT in the last 72 hours. Urinalysis    Component Value Date/Time   COLORURINE YELLOW 01/05/2020 1640   APPEARANCEUR CLEAR 01/05/2020 1640   LABSPEC 1.013 01/05/2020 1640   PHURINE 5.0 01/05/2020 1640   GLUCOSEU NEGATIVE 01/05/2020 1640   HGBUR NEGATIVE 01/05/2020 1640   BILIRUBINUR NEGATIVE 01/05/2020 1640   KETONESUR NEGATIVE 01/05/2020 1640   PROTEINUR NEGATIVE 01/05/2020 1640   UROBILINOGEN 0.2 08/25/2012 1408   NITRITE NEGATIVE 01/05/2020 1640   LEUKOCYTESUR NEGATIVE 01/05/2020 1640   Sepsis Labs Invalid input(s): PROCALCITONIN,  WBC,  LACTICIDVEN Microbiology Recent Results (from the past 240 hour(s))  SARS Coronavirus 2 by RT PCR (hospital order, performed in [facility] hospital lab) Nasopharyngeal Nasopharyngeal Swab     Status: None   Collection Time: 01/03/20  8:56 PM   Specimen: Nasopharyngeal Swab  Result Value Ref Range Status   SARS Coronavirus 2 NEGATIVE NEGATIVE Final    Comment: (NOTE) SARS-CoV-2 target nucleic acids are NOT DETECTED.  The SARS-CoV-2 RNA is generally detectable in upper and lower respiratory specimens during the acute phase of infection. The lowest concentration of SARS-CoV-2 viral copies this assay can detect is 250 copies / mL. A negative result does not preclude SARS-CoV-2 infection and should not be used as the sole basis for treatment or other patient management decisions.  A negative result may occur with improper specimen collection / handling, submission of specimen other than nasopharyngeal swab, presence of viral mutation(s) within the areas targeted by this assay, and inadequate number of viral copies (<250 copies / mL). A negative result must be combined with clinical observations, patient history, and epidemiological information.  Fact Sheet for Patients:   StrictlyIdeas.no  Fact Sheet for Healthcare Providers: BankingDealers.co.za  This test is not  yet approved or  cleared by the [place] and has been authorized for detection and/or diagnosis of SARS-CoV-2 by FDA under an Emergency Use Authorization (EUA).  This EUA will remain in effect (meaning this test can be used) for the duration of the COVID-19 declaration under Section 564(b)(1) of the Act, 21 U.S.C. section 360bbb-3(b)(1), unless the authorization is terminated or revoked sooner.  Performed at [facility] [facility], [place] [address] [place], [place] [address]   SARS CORONAVIRUS 2 (TAT 6-24 HRS) Nasopharyngeal Nasopharyngeal Swab     Status: None   Collection Time: 01/09/20  5:05 PM   Specimen: Nasopharyngeal Swab  Result Value Ref Range Status   SARS Coronavirus 2 NEGATIVE NEGATIVE Final    Comment: (NOTE) SARS-CoV-2 target nucleic acids are NOT DETECTED.  The SARS-CoV-2 RNA is generally detectable in upper and lower respiratory specimens during the acute phase of infection. Negative results do not preclude SARS-CoV-2 infection, do not rule out co-infections with other pathogens, and should not be used as the sole basis for treatment or other patient management decisions. Negative results must be combined with clinical observations, patient history, and epidemiological information. The expected result is Negative.  Fact Sheet for Patients: SugarRoll.be  Fact Sheet for Healthcare Providers: https://www.woods-mathews.com/  This test is not yet approved or cleared by the [place] FDA and  has been authorized for detection and/or diagnosis of SARS-CoV-2 by FDA under an Emergency Use Authorization (EUA). This EUA will remain  in effect (meaning this test can be used) for the duration of the COVID-19 declaration under Se ction 564(b)(1) of the Act, 21 U.S.C. section 360bbb-3(b)(1), unless the authorization is terminated or revoked sooner.  Performed at [facility], [place] [address],  [place], [place] [address]      Time coordinating discharge: 40 minutes  SIGNED:   Little Ishikawa, MD  [facility]

## 2020-01-12 ENCOUNTER — Non-Acute Institutional Stay (SKILLED_NURSING_FACILITY): Payer: PPO | Admitting: Internal Medicine

## 2020-01-12 ENCOUNTER — Other Ambulatory Visit: Payer: Self-pay | Admitting: Adult Health

## 2020-01-12 ENCOUNTER — Encounter: Payer: Self-pay | Admitting: Internal Medicine

## 2020-01-12 DIAGNOSIS — D62 Acute posthemorrhagic anemia: Secondary | ICD-10-CM

## 2020-01-12 DIAGNOSIS — N183 Chronic kidney disease, stage 3 unspecified: Secondary | ICD-10-CM | POA: Diagnosis not present

## 2020-01-12 DIAGNOSIS — I10 Essential (primary) hypertension: Secondary | ICD-10-CM

## 2020-01-12 DIAGNOSIS — M9711XD 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, subsequent encounter: Secondary | ICD-10-CM

## 2020-01-12 DIAGNOSIS — L03115 Cellulitis of right lower limb: Secondary | ICD-10-CM | POA: Diagnosis not present

## 2020-01-12 DIAGNOSIS — K5909 Other constipation: Secondary | ICD-10-CM | POA: Insufficient documentation

## 2020-01-12 MED ORDER — HYDROCODONE-ACETAMINOPHEN 5-325 MG PO TABS: 1.0000 | ORAL_TABLET | ORAL | 0 refills | Status: AC | PRN

## 2020-01-12 NOTE — Progress Notes (Signed)
NURSING HOME LOCATION:  [place] NUMBER: 150/P   CODE STATUS: Full Code   [facility]:WEXH, Karie Fetch, MD     This is a comprehensive admission note to [facility] performed on this date less than 30 days from date of admission. Included are preadmission medical/surgical history; reconciled medication list; family history; social history and comprehensive review of systems.  Corrections and additions to the records were documented. Comprehensive physical exam was also performed. Additionally a clinical summary was entered for each active diagnosis pertinent to this admission in the Problem List to enhance continuity of care.  HPI: Patient was hospitalized 6/29-01/11/2020 for right periprosthetic knee fracture sustained in a mechanical fall.  This is in the context of peripheral neuropathy with documented foot drop and recent diagnosis of arch dysfunction.  She typically ambulates with a walker.  She denied any neurologic or cardiac prodrome prior to the fall. Open reduction & internal fixation of the right distal femoral fracture was performed on 6/30 by Dr. Doreatha Martin.  Postop Lovenox DVT prophylaxis was initiated.  Postop hemoglobin trended down but stabilized in the 8 range.  Iron supplementation was initiated. Due to soft blood pressure recordings, Norvasc was held to prevent exacerbation or progression of AKI on CKD stage IIIb.  AKI was attributed to hypovolemia and hypotension. Acute hypoxic respiratory failure was clinically present requiring supplemental oxygen at 2 L/min. Potassium was initially supplemented; Lokelma was administered because of a potassium of 5. She was intolerant to Dulcolax for opioid-induced constipation; fleets enema was effective. With mobilization postop she did experience nausea which resolved with Gas-X and Zofran as needed. Orthopedics recommended holding Plaquenil for 4 weeks postop. PT/OT recommended rehab at SNF.  Past medical and surgical  history: Includes rheumatoid arthritis, osteoporosis, degenerative joint disease, peripheral neuropathy, essential hypertension, dyslipidemia, history of fibromyalgia, depression, and history of anemia. Surgical procedures are extensive and include TAH, back surgery, cholecystectomy, bilateral TKA with revision x2 on the left, and breast biopsy.  Social history: Nondrinker, never smoked.  Family history: Extensive history reviewed.   Review of systems: She again confirms that there was no cardiac or neuro prodrome prior to the fall.  She did state that she hit her head on the arch of the door resulting in headaches which have resolved.  There was no loss of consciousness.  Constipation has resolved. Staff reports erythema at operative site. She stated that last night she had pain around the right knee which disturbs sleep.  Massage and ice topically earlier today were of benefit in decreasing the pain level. No bleeding dyscrasias reported. Neuropathy is manifested as numbness and tingling in the legs and feet.  Constitutional: No fever, significant weight change, fatigue  Eyes: No redness, discharge, pain, vision change ENT/mouth: No nasal congestion, purulent discharge, earache, change in hearing, sore throat  Cardiovascular: No chest pain, palpitations, paroxysmal nocturnal dyspnea, claudication  Respiratory: No cough, sputum production, hemoptysis, DOE, significant snoring, apnea  Gastrointestinal: No heartburn, dysphagia, abdominal pain, nausea /vomiting, rectal bleeding, melena, change in bowels Genitourinary: No dysuria, hematuria, pyuria, incontinence, nocturia Dermatologic: No  pruritus or drainage Neurologic: No dizziness, active headache, syncope, seizures Psychiatric: No significant anxiety, depression, insomnia, anorexia Endocrine: No change in hair/skin/nails, excessive thirst, excessive hunger, excessive urination  Hematologic/lymphatic: No significant bruising,  lymphadenopathy, abnormal bleeding Allergy/immunology: No itchy/watery eyes, significant sneezing, urticaria, angioedema  Physical exam:  Pertinent or positive findings: She is alert and oriented and very communicative.  Grade 1/2 systolic murmur is noted at  the base.  Pedal pulses are decreased.  She has 1/2+ edema at the sock line.  Fusiform change of the knees are present greater on the right than the left.  There is very mild erythema at the right lateral knee with slight localized fluctuance approximately 1 inch in diameter.  There is no purulent drainage.  The other operative sites showed no clinical cellulitis.  General appearance: Adequately nourished; no acute distress, increased work of breathing is present.   Lymphatic: No lymphadenopathy about the head, neck, axilla. Eyes: No conjunctival inflammation or lid edema is present. There is no scleral icterus. Ears:  External ear exam shows no significant lesions or deformities.   Nose:  External nasal examination shows no deformity or inflammation. Nasal mucosa are pink and moist without lesions, exudates Oral exam: Lips and gums are healthy appearing.There is no oropharyngeal erythema or exudate. Neck:  No thyromegaly, masses, tenderness noted.    Heart:  Normal rate and regular rhythm. S1 and S2 normal without gallop, click, rub.  Lungs: Chest clear to auscultation without wheezes, rhonchi, rales, rubs. Abdomen: Bowel sounds are normal.  Abdomen is soft and nontender with no organomegaly, hernias, masses. GU: Deferred  Extremities:  No cyanosis, clubbing Neurologic exam:  Balance, Rhomberg, finger to nose testing could not be completed due to clinical state Skin: Warm & dry w/o tenting. No significant lesions or rash.  See clinical summary under each active problem in the Problem List with associated updated therapeutic plan

## 2020-01-12 NOTE — Progress Notes (Signed)
Location:   [facility] Room Number: 308 MVHQI of Service:  SNF (31)   CODE STATUS: full code   Allergies  Allergen Reactions  . Ativan [Lorazepam] Other (See Comments)    Highly irritable  . Haldol [Haloperidol] Other (See Comments)    Vegetative State  . Epinephrine Other (See Comments)    Reaction:  Increased pts BP and caused shoulder to jerk   . Levofloxacin Nausea And Vomiting  . Lipitor [Atorvastatin] Other (See Comments)    Reaction:  Joint pain and dizziness   . Trovan [Alatrofloxacin] Nausea And Vomiting    Pt states that this med caused pancreatitis.    Chief Complaint  Patient presents with  . Hospitalization Follow-up    HPI:  She is a 79 year old woman who has been hospitalized from 01-03-20 through 01-05-20. She had a mechanical fall at home suffering a periprosthetic fracture of the right femur. She had an ORIF on 01-05-20. She is here for short term rehab with her goal to return back home. She denies any uncontrolled pain. Is denying any constipation. She denies any anxiety. She will continue to be followed for her chronic illnesses including: ra; hypertension; polyneuropathy   Past Medical History:  Diagnosis Date  . Anemia   . Anxiety   . Arthritis   . Atelectasis, left    left lower lung  . Blood transfusion without reported diagnosis   . Cataract cortical, senile, bilateral 03/19/2018  . Complication of anesthesia 2012   "irritated trachea" with cough x 1 year from intubation 2012.  smaller tube used with other surgeries  . Depression    just lost her son a month ago  . Esophageal hernia   . Fibromyalgia   . Headache    occasionally  . HTN (hypertension)   . Hx of colonic polyps   . Hydronephrosis of right kidney   . Hypercholesterolemia   . Hypertensive retinopathy of both eyes 03/19/2018  . Neuropathy due to medical condition ([place])    bilateral lower legs  . Obesity   . 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  Osteoporosis   . Renal angiomyolipoma   . Rheumatoid arthritis [facility])     Past Surgical History:  Procedure Laterality Date  . ABDOMINAL HYSTERECTOMY  1969  . BACK SURGERY  2012   rod in back-  lumbar disc removal with bone graft  . blephoroplasty     bilateral  . BREAST BIOPSY  1989  . CHOLECYSTECTOMY  2010  . EXCISIONAL TOTAL KNEE ARTHROPLASTY WITH ANTIBIOTIC SPACERS Left 11/14/2016   Procedure: EXCISIONAL LEFT TOTAL KNEE ARTHROPLASTY WITH  PLACEMENT OF ANTIBIOTIC SPACERS;  Surgeon: [name], MD;  Location: [facility];  Service: Orthopedics;  Laterality: Left;  . JOINT REPLACEMENT    . NISSEN FUNDOPLICATION    . ORIF FEMUR FRACTURE Right 01/04/2020   Procedure: OPEN REDUCTION INTERNAL FIXATION (ORIF) DISTAL FEMUR FRACTURE;  Surgeon: Shona Needles, MD;  Location: [place];  Service: Orthopedics;  Laterality: Right;  . PARAESOPHAGEAL HERNIA REPAIR  1989  . REVISION TOTAL KNEE ARTHROPLASTY Left 06/09/2017  . TONSILLECTOMY  1946  . TOTAL HIP ARTHROPLASTY Right 08/31/2012   Procedure: TOTAL HIP ARTHROPLASTY ANTERIOR APPROACH;  Surgeon: Mauri Pole, MD;  Location: [facility];  Service: Orthopedics;  Laterality: Right;  . TOTAL KNEE ARTHROPLASTY  2001   left  . TOTAL KNEE ARTHROPLASTY  2010   right  . TOTAL KNEE REVISION  Left 06/09/2017   Procedure: LEFT REVISION KNEE ARTHROPLASTY;  Surgeon: [name], MD;  Location: [facility];  Service: Orthopedics;  Laterality: Left;    Social History   Socioeconomic History  . Marital status: Divorced    Spouse name: Not on file  . Number of children: 3  . Years of education: College  . 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 Smoking status: Never Smoker  . Smokeless tobacco: Never Used  Vaping Use  . Vaping Use: Never used  Substance and Sexual Activity  . Alcohol use: No  . Drug use: No  . Sexual activity: Never  Other Topics  Concern  . Not on file  Social History Narrative   Patient lives at home alone.   Caffeine Use: 1 cup daily   Social Determinants of Health   Financial Resource Strain:   . Difficulty of Paying Living Expenses:   Food Insecurity:   . Worried About Charity fundraiser in the Last Year:   . Arboriculturist in the Last Year:   Transportation Needs:   . Film/video editor (Medical):   Marland Kitchen Lack of Transportation (Non-Medical):   Physical Activity:   . Days of Exercise per Week:   . Minutes of Exercise per Session:   Stress:   . Feeling of Stress :   Social Connections:   . Frequency of Communication with Friends and Family:   . Frequency of Social Gatherings with Friends and Family:   . Attends Religious Services:   . Active Member of Clubs or Organizations:   . Attends Archivist Meetings:   Marland Kitchen Marital Status:   Intimate Partner Violence:   . Fear of Current or Ex-Partner:   . Emotionally Abused:   Marland Kitchen Physically Abused:   . Sexually Abused:    Family History  Problem Relation Age of Onset  . Diabetes Father   . Osteoarthritis Father   . Heart disease Father   . Stroke Father   . Osteoarthritis Mother   . Stroke Mother   . Pneumonia Mother   . Colon cancer Sister 53  . Colon polyps Other        neice/nephew  . Diabetes Sister   . Diabetes Brother       VITAL SIGNS BP 140/70   Pulse 78   Temp 97.9 F (36.6 C)   Ht 5\' 7"  (1.702 m)   Wt 200 lb (90.7 kg)   BMI 31.32 kg/m   Outpatient Encounter Medications as of 01/11/2020  Medication Sig  . atenolol (TENORMIN) 50 MG tablet TAKE 1 TABLET BY MOUTH EVERY DAY  . Calcium Carbonate-Vitamin D (CALCIUM 600+D) 600-400 MG-UNIT tablet Take 1 tablet by mouth every other day.  . docusate sodium (COLACE) 100 MG capsule Take 1 capsule (100 mg total) by mouth 2 (two) times daily.  Marland Kitchen enoxaparin (LOVENOX) 30 MG/0.3ML injection Inject 0.3 mLs (30 mg total) into the skin daily. Through 02-09-20   . ferrous sulfate 325 (65  FE) MG EC tablet Take 325 mg by mouth daily with breakfast.   . FLUoxetine (PROZAC) 20 MG capsule TAKE 1 CAPSULE BY MOUTH EVERY DAY IN THE MORNING (Patient taking differently: Take 20 mg by mouth daily. )  . folic acid (FOLVITE) 664 MCG tablet Take 800 mcg daily by mouth.   . gabapentin (NEURONTIN) 300 MG capsule TAKE 1  CAPSULE BY MOUTH 2 TIMES DAILY  . HYDROcodone-acetaminophen (NORCO/VICODIN) 5-325 MG tablet Take 1 tablets by mouth every 4 (four) hours as needed for severe pain. Through 01-16-20   . methocarbamol (ROBAXIN) 500 MG tablet Take 1 tablet (500 mg total) by mouth every 6 (six) hours as needed for muscle spasms.  . Multiple Vitamin (MULTIVITAMIN WITH MINERALS) TABS tablet Take 1 tablet by mouth daily.   . Omega-3 1000 MG CAPS Take 1 capsule by mouth daily.   . polyethylene glycol (MIRALAX / GLYCOLAX) 17 g packet Take 17 g by mouth 2 (two) times daily.  . promethazine (PHENERGAN) 12.5 MG tablet Take 1 tablet (12.5 mg total) by mouth every 8 (eight) hours as needed for nausea or vomiting.  . rosuvastatin (CRESTOR) 10 MG tablet TAKE 1 TABLET BY MOUTH EVERY DAY  . tamsulosin (FLOMAX) 0.4 MG CAPS capsule Take 0.4 mg by mouth daily     No facility-administered encounter medications on file as of 01/11/2020.     SIGNIFICANT DIAGNOSTIC EXAMS  TODAY  01-03-20: right knee x-ray: 1. Comminuted and displaced periprosthetic distal right femur fracture.  01-03-20: right hip x-ray: 1. No acute pelvic or right hip fracture.   01-03-20: right tibia/fibula x-ray: 1. No acute fracture of the right tibia or fibula   LABS REVIEWED TODAY  01-03-20: wbc 18.4; hgb 12.6; hct 40.3; mcv 92.6 plt 240; glucose 157; bun 34; creat 1.75; k+ 4.5; na++ 143; ca 9.4 01-05-20: vit D 35.04 01-06-20: wbc 10.5; hgb 8.5; hct 27.9; mcv 95.5 plt 180; glucose 92; bun 30; creat 1.59; k+ 4.2; na++ 137; ca 8.1 01-08-20: wbc 8.3; hgb 8.3; hct 27.5; mcv 94.8 plt 183 01-09-20: hgb 8.4; hct 27.6; glucose 114; bun 20; creat 1.16; k+ 5.0;  na++ 137; ca 8.7    Review of Systems  Constitutional: Negative for malaise/fatigue.  Respiratory: Negative for cough and shortness of breath.   Cardiovascular: Negative for chest pain, palpitations and leg swelling.  Gastrointestinal: Negative for abdominal pain, constipation and heartburn.  Musculoskeletal: Negative for back pain, joint pain and myalgias.  Skin: Negative.   Neurological: Negative for dizziness.  Psychiatric/Behavioral: The patient is not nervous/anxious.     Physical Exam Constitutional:      General: She is not in acute distress.    Appearance: She is overweight. She is not diaphoretic.  Eyes:     Conjunctiva/sclera: Conjunctivae normal.  Neck:     Thyroid: No thyromegaly.     Vascular: No JVD.  Cardiovascular:     Rate and Rhythm: Normal rate and regular rhythm.     Pulses: Normal pulses.  Pulmonary:     Effort: Pulmonary effort is normal. No respiratory distress.     Breath sounds: Normal breath sounds. No wheezing.  Abdominal:     General: Bowel sounds are normal. There is no distension.     Palpations: Abdomen is soft.     Tenderness: There is no abdominal tenderness.  Musculoskeletal:     Cervical back: Neck supple.     Right lower leg: No edema.     Left lower leg: No edema.     Comments: Able to move all extremities  Is status post ORIF distal right femur 01-04-20   Lymphadenopathy:     Cervical: No cervical adenopathy.  Skin:    General: Skin is warm and dry.  Neurological:     Mental Status: She is alert and oriented to person, place, and time.  Psychiatric:  Mood and Affect: Mood normal.        ASSESSMENT/ PLAN:  TODAY  1. 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 subsequent encounter/ closed supracondylar fracture of right femur with routine healing subsequent encounter: is stable will continue therapy as directed and will follow up with orthopedics. Will continue lovenox 30 mg daily through 02-09-20.  Will continue vicodin 5/325 mg every 4 hours as needed through 01-16-20. Will continue robaxin 500 mg every 6 hours as needed  2. Rheumatoid arthritis involving multiple joints with positive rheumatoid factor: is stable plaquenil on hold for 4 weeks per ortho will monitor   3. Benign hypertensive heart disease without heart failure: is stable b/p 140/70 will continue atenolol 50 mg every other day  4. Other polyneuropathy: is stable will continue gabapentin 300 mg twice daily   5. Chronic kidney disease (CKD) active medical management without dialysis stage 3: bun 20; creat 1.16   6. Reactive depression: is stable will continue prozac 20 mg daily   7. Acute blood loss anemia: is stable hgb 8.4 will continue iron daily   8. Dyslipidemia: is stable will continue crestor 10 mg every other day   9. Chronic constipation: is stable will continue colace twice daily miralax twice daily   10. Urine retention: is stable will continue flomax 0.4 mg daily   01-17-20 will check cbc bmp   MD is aware of resident's narcotic use and is in agreement with current plan of care. We will attempt to wean resident as appropriate.  Ok Edwards NP [facility]  Contact [phone] Monday through Friday 8am- 5pm  After hours call [phone]

## 2020-01-13 ENCOUNTER — Other Ambulatory Visit (HOSPITAL_COMMUNITY)
Admission: RE | Admit: 2020-01-13 | Discharge: 2020-01-13 | Disposition: A | Discharge status: Home / Self Care | Payer: PPO | Source: Skilled Nursing Facility | Attending: Adult Health | Admitting: Adult Health

## 2020-01-13 ENCOUNTER — Encounter: Payer: Self-pay | Admitting: Internal Medicine

## 2020-01-13 DIAGNOSIS — S72451A Displaced supracondylar fracture without intracondylar extension of lower end of right femur, initial encounter for closed fracture: Secondary | ICD-10-CM | POA: Insufficient documentation

## 2020-01-13 LAB — CBC WITH DIFFERENTIAL/PLATELET
Abs Immature Granulocytes: 0.06 10*3/uL (ref 0.00–0.07)
Basophils Absolute: 0.1 10*3/uL (ref 0.0–0.1)
Basophils Relative: 1 %
Eosinophils Absolute: 0.6 10*3/uL — ABNORMAL HIGH (ref 0.0–0.5)
Eosinophils Relative: 6 %
HCT: 28.7 % — ABNORMAL LOW (ref 36.0–46.0)
Hemoglobin: 8.8 g/dL — ABNORMAL LOW (ref 12.0–15.0)
Immature Granulocytes: 1 %
Lymphocytes Relative: 20 %
Lymphs Abs: 2.2 10*3/uL (ref 0.7–4.0)
MCH: 29.5 pg (ref 26.0–34.0)
MCHC: 30.7 g/dL (ref 30.0–36.0)
MCV: 96.3 fL (ref 80.0–100.0)
Monocytes Absolute: 1.2 10*3/uL — ABNORMAL HIGH (ref 0.1–1.0)
Monocytes Relative: 11 %
Neutro Abs: 7 10*3/uL (ref 1.7–7.7)
Neutrophils Relative %: 61 %
Platelets: 307 10*3/uL (ref 150–400)
RBC: 2.98 MIL/uL — ABNORMAL LOW (ref 3.87–5.11)
RDW: 15.4 % (ref 11.5–15.5)
WBC: 11.1 10*3/uL — ABNORMAL HIGH (ref 4.0–10.5)
nRBC: 0 % (ref 0.0–0.2)

## 2020-01-13 LAB — BASIC METABOLIC PANEL
Anion gap: 9 (ref 5–15)
BUN: 26 mg/dL — ABNORMAL HIGH (ref 8–23)
CO2: 29 mmol/L (ref 22–32)
Calcium: 9.2 mg/dL (ref 8.9–10.3)
Chloride: 101 mmol/L (ref 98–111)
Creatinine, Ser: 1.26 mg/dL — ABNORMAL HIGH (ref 0.44–1.00)
GFR calc Af Amer: 47 mL/min — ABNORMAL LOW (ref >60)
GFR calc non Af Amer: 40 mL/min — ABNORMAL LOW (ref >60)
Glucose, Bld: 95 mg/dL (ref 70–99)
Potassium: 4.6 mmol/L (ref 3.5–5.1)
Sodium: 139 mmol/L (ref 135–145)

## 2020-01-13 NOTE — Patient Instructions (Signed)
See assessment and plan under each diagnosis in the problem list and acutely for this visit 

## 2020-01-13 NOTE — Assessment & Plan Note (Signed)
Monitor renal function 

## 2020-01-13 NOTE — Assessment & Plan Note (Signed)
Blood pressure tending upward.  Norvasc was held while hospitalized due to soft blood pressures and AKI attributed to hypotension and hypovolemia. Continue monitor of blood pressure and renal function.

## 2020-01-13 NOTE — Assessment & Plan Note (Signed)
PT/OT at SNF °

## 2020-01-13 NOTE — Assessment & Plan Note (Signed)
Continue iron supplementation Monitor H/H

## 2020-01-13 NOTE — Assessment & Plan Note (Deleted)
PT/OT at SNF °

## 2020-01-16 ENCOUNTER — Non-Acute Institutional Stay (SKILLED_NURSING_FACILITY): Payer: PPO | Admitting: Adult Health

## 2020-01-16 ENCOUNTER — Encounter: Payer: Self-pay | Admitting: Adult Health

## 2020-01-16 DIAGNOSIS — S72451D Displaced supracondylar fracture without intracondylar extension of lower end of right femur, subsequent encounter for closed fracture with routine healing: Secondary | ICD-10-CM

## 2020-01-16 DIAGNOSIS — M9711XD 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, subsequent encounter: Secondary | ICD-10-CM

## 2020-01-16 NOTE — Progress Notes (Signed)
Location:    [place] Room Number: 150/P Place of Service:  SNF (31)   CODE STATUS: Full Code  Allergies  Allergen Reactions   Ativan [Lorazepam] Other (See Comments)    Highly irritable   Haldol [Haloperidol] Other (See Comments)    Vegetative State   Epinephrine Other (See Comments)    Reaction:  Increased pts BP and caused shoulder to jerk    Levofloxacin Nausea And Vomiting   Lipitor [Atorvastatin] Other (See Comments)    Reaction:  Joint pain and dizziness    Trovan [Alatrofloxacin] Nausea And Vomiting    Pt states that this med caused pancreatitis.    Chief Complaint  Patient presents with   Acute Visit    Pain Management    HPI:  She is presently taking vicodin for her right femur fracture. She is complaining of cramping type of pain in her right thigh area. We have discussed the effects of long term use of narcotics. She continues to participate in therapy. She is willing to come off the vicodin at this time.   Past Medical History:  Diagnosis Date   Anemia    Anxiety    Arthritis    Atelectasis, left    left lower lung   Blood transfusion without reported diagnosis    Cataract cortical, senile, bilateral 93/81/0175   Complication of anesthesia 2012   "irritated trachea" with cough x 1 year from intubation 2012.  smaller tube used with other surgeries   Depression    just lost her son a month ago   Esophageal hernia    Fibromyalgia    Headache    occasionally   HTN (hypertension)    Hx of colonic polyps    Hydronephrosis of right kidney    Hypercholesterolemia    Hypertensive retinopathy of both eyes 03/19/2018   Neuropathy due to medical condition [facility])    bilateral lower legs   Obesity    Osteoarthritis (arthritis due to wear and tear of joints)    Osteoporosis    Renal angiomyolipoma    Rheumatoid arthritis [facility])     Past Surgical History:  Procedure Laterality Date   ABDOMINAL  HYSTERECTOMY  1969   BACK SURGERY  2012   rod in back-  lumbar disc removal with bone graft   blephoroplasty     bilateral   BREAST BIOPSY  1989   CHOLECYSTECTOMY  2010   EXCISIONAL TOTAL KNEE ARTHROPLASTY WITH ANTIBIOTIC SPACERS Left 11/14/2016   Procedure: EXCISIONAL LEFT TOTAL KNEE ARTHROPLASTY WITH  PLACEMENT OF ANTIBIOTIC SPACERS;  Surgeon: [name], MD;  [facility]: [facility];  Service: Orthopedics;  Laterality: Left;   JOINT REPLACEMENT     NISSEN FUNDOPLICATION     ORIF FEMUR FRACTURE Right 01/04/2020   Procedure: OPEN REDUCTION INTERNAL FIXATION (ORIF) DISTAL FEMUR FRACTURE;  Surgeon: Shona Needles, MD;  Location: [place];  Service: Orthopedics;  Laterality: Right;   PARAESOPHAGEAL HERNIA REPAIR  1989   REVISION TOTAL KNEE ARTHROPLASTY Left 06/09/2017   TONSILLECTOMY  1946   TOTAL HIP ARTHROPLASTY Right 08/31/2012   Procedure: TOTAL HIP ARTHROPLASTY ANTERIOR APPROACH;  Surgeon: Mauri Pole, MD;  [facility];  Service: Orthopedics;  Laterality: Right;   TOTAL KNEE ARTHROPLASTY  2001   left   TOTAL KNEE ARTHROPLASTY  2010   right   TOTAL KNEE REVISION Left 06/09/2017   Procedure: LEFT REVISION KNEE ARTHROPLASTY;  Surgeon: [name], MD;  Location: [place];  Service:  Orthopedics;  Laterality: Left;    Social History   Socioeconomic History   Marital status: Divorced    Spouse name: Not on file   Number of children: 3   Years of education: College   Highest education level: Not on file  Occupational History    Employer: [employer]    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   Caffeine Use: 1 cup daily   Social Determinants of  Health   Financial Resource Strain:    Difficulty of Paying Living Expenses:   Food Insecurity:    Worried About Charity fundraiser in the Last Year:    Arboriculturist in the Last Year:   Transportation Needs:    Film/video editor (Medical):    Lack of Transportation (Non-Medical):   Physical Activity:    Days of Exercise per Week:    Minutes of Exercise per Session:   Stress:    Feeling of Stress :   Social Connections:    Frequency of Communication with Friends and Family:    Frequency of Social Gatherings with Friends and Family:    Attends Religious Services:    Active Member of Clubs or Organizations:    Attends Music therapist:    Marital Status:   Intimate Partner Violence:    Fear of Current or Ex-Partner:    Emotionally Abused:    Physically Abused:    Sexually Abused:    Family History  Problem Relation Age of Onset   Diabetes Father    Osteoarthritis Father    Heart disease Father    Stroke Father    Osteoarthritis Mother    Stroke Mother    Pneumonia Mother    Colon cancer Sister 71   Colon polyps Other        neice/nephew   Diabetes Sister    Diabetes Brother       VITAL SIGNS BP 139/72    Pulse 67    Temp 98.8 F (37.1 C) (Oral)    Resp 20    Ht 5\' 6"  (1.676 m)    Wt 216 lb 6.4 oz (98.2 kg)    SpO2 95%    BMI 34.93 kg/m   Outpatient Encounter Medications as of 01/16/2020  Medication Sig   atenolol (TENORMIN) 50 MG tablet TAKE 1 TABLET BY MOUTH EVERY DAY   Calcium Carbonate-Vitamin D (CALCIUM 600+D) 600-400 MG-UNIT tablet Take 1 tablet by mouth every other day.   docusate sodium (COLACE) 100 MG capsule Take 1 capsule (100 mg total) by mouth 2 (two) times daily.   doxycycline (DORYX) 100 MG EC tablet Take 100 mg by mouth 2 (two) times daily.   enoxaparin (LOVENOX) 30 MG/0.3ML injection Inject 0.3 mLs (30 mg total) into the skin daily.   ferrous sulfate 325 (65 FE) MG EC tablet Take 325 mg by  mouth daily with breakfast.    FLUoxetine (PROZAC) 20 MG capsule TAKE 1 CAPSULE BY MOUTH EVERY DAY IN THE MORNING   folic acid (FOLVITE) 245 MCG tablet Take 800 mcg daily by mouth.    furosemide (LASIX) 40 MG tablet Take  40 mg by mouth daily as needed. As needed for weight gain >/= 3 pounds. Notify provider if meds given.   gabapentin (NEURONTIN) 300 MG capsule TAKE 1 CAPSULE BY MOUTH 2 TIMES DAILY   HYDROcodone-acetaminophen (NORCO/VICODIN) 5-325 MG tablet Take 1 tablet by mouth every 4 (four) hours as needed for up to 4 days for moderate pain or severe pain.   methocarbamol (ROBAXIN) 500 MG tablet Take 1 tablet (500 mg total) by mouth every 6 (six) hours as needed for muscle spasms.   Multiple Vitamin (MULTIVITAMIN WITH MINERALS) TABS tablet Take 1 tablet by mouth daily.    NON FORMULARY Diet: _____ Regular, __x____ NAS, _______Consistent Carbohydrate, _______NPO _____Other   Omega-3 1000 MG CAPS Take 1 capsule by mouth daily.    OXYGEN Inhale 2 L into the lungs continuous.   polyethylene glycol (MIRALAX / GLYCOLAX) 17 g packet Take 17 g by mouth 2 (two) times daily.   promethazine (PHENERGAN) 12.5 MG tablet Take 12.5 mg by mouth every 6 (six) hours as needed for nausea or vomiting. As needed for nausea and vomiting x 7 days   rosuvastatin (CRESTOR) 10 MG tablet TAKE 1 TABLET BY MOUTH EVERY DAY   tamsulosin (FLOMAX) 0.4 MG CAPS capsule Take 0.4 mg by mouth daily. Give after same meal each day. Do not open, crush or chew tabs.   [DISCONTINUED] promethazine (PHENERGAN) 25 MG tablet Take 1 tablet (25 mg total) by mouth every 8 (eight) hours as needed for nausea or vomiting.   [DISCONTINUED] Turmeric 400 MG CAPS Take 1 capsule by mouth daily.    No facility-administered encounter medications on file as of 01/16/2020.     SIGNIFICANT DIAGNOSTIC EXAMS   PREVIOUS   01-03-20: right knee x-ray: 1. Comminuted and displaced periprosthetic distal right femur fracture.  01-03-20:  right hip x-ray: 1. No acute pelvic or right hip fracture.   01-03-20: right tibia/fibula x-ray: 1. No acute fracture of the right tibia or fibula   NO NEW EXAMS.   LABS REVIEWED PREVIOUS   01-03-20: wbc 18.4; hgb 12.6; hct 40.3; mcv 92.6 plt 240; glucose 157; bun 34; creat 1.75; k+ 4.5; na++ 143; ca 9.4 01-05-20: vit D 35.04 01-06-20: wbc 10.5; hgb 8.5; hct 27.9; mcv 95.5 plt 180; glucose 92; bun 30; creat 1.59; k+ 4.2; na++ 137; ca 8.1 01-08-20: wbc 8.3; hgb 8.3; hct 27.5; mcv 94.8 plt 183 01-09-20: hgb 8.4; hct 27.6; glucose 114; bun 20; creat 1.16; k+ 5.0; na++ 137; ca 8.7   NO NEW LABS.   Review of Systems  Constitutional: Negative for malaise/fatigue.  Respiratory: Negative for cough and shortness of breath.   Cardiovascular: Negative for chest pain, palpitations and leg swelling.  Gastrointestinal: Negative for abdominal pain, constipation and heartburn.  Musculoskeletal: Positive for myalgias. Negative for back pain and joint pain.  Skin: Negative.   Neurological: Negative for dizziness.  Psychiatric/Behavioral: The patient is not nervous/anxious.     Physical Exam Constitutional:      General: She is not in acute distress.    Appearance: She is well-developed. She is not diaphoretic.  Neck:     Thyroid: No thyromegaly.  Cardiovascular:     Rate and Rhythm: Normal rate and regular rhythm.     Pulses: Normal pulses.     Heart sounds: Normal heart sounds.  Pulmonary:     Effort: Pulmonary effort is normal. No respiratory distress.     Breath sounds: Normal breath sounds.  Abdominal:     General: Bowel sounds  are normal. There is no distension.     Palpations: Abdomen is soft.     Tenderness: There is no abdominal tenderness.  Musculoskeletal:     Cervical back: Neck supple.     Right lower leg: No edema.     Left lower leg: No edema.     Comments:  Able to move all extremities  Is status post ORIF distal right femur 01-04-20    Lymphadenopathy:     Cervical: No cervical  adenopathy.  Skin:    General: Skin is warm and dry.  Neurological:     Mental Status: She is alert and oriented to person, place, and time.  Psychiatric:        Mood and Affect: Mood normal.       ASSESSMENT/ PLAN:  TODAY  1. 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 Closed supracondylar fracture of right femur with routine healing subsequent encounter  Will stop vicodin Will begin robaxin 500 mg every 6 hours routinely Will begin tylenol cr 650 mg every 6 hours  Will monitor her status.      MD is aware of resident's narcotic use and is in agreement with current plan of care. We will attempt to wean resident as appropriate.  Ok Edwards NP [facility]  Contact [phone] Monday through Friday 8am- 5pm  After hours call [phone]

## 2020-01-17 ENCOUNTER — Other Ambulatory Visit (HOSPITAL_COMMUNITY)
Admission: RE | Admit: 2020-01-17 | Discharge: 2020-01-17 | Disposition: A | Discharge status: Home / Self Care | Payer: PPO | Source: Skilled Nursing Facility | Attending: Adult Health | Admitting: Adult Health

## 2020-01-17 DIAGNOSIS — I119 Hypertensive heart disease without heart failure: Secondary | ICD-10-CM | POA: Insufficient documentation

## 2020-01-17 LAB — CBC
HCT: 28.7 % — ABNORMAL LOW (ref 36.0–46.0)
Hemoglobin: 8.8 g/dL — ABNORMAL LOW (ref 12.0–15.0)
MCH: 29.7 pg (ref 26.0–34.0)
MCHC: 30.7 g/dL (ref 30.0–36.0)
MCV: 97 fL (ref 80.0–100.0)
Platelets: 311 10*3/uL (ref 150–400)
RBC: 2.96 MIL/uL — ABNORMAL LOW (ref 3.87–5.11)
RDW: 15.5 % (ref 11.5–15.5)
WBC: 7.6 10*3/uL (ref 4.0–10.5)
nRBC: 0 % (ref 0.0–0.2)

## 2020-01-17 LAB — BASIC METABOLIC PANEL
Anion gap: 11 (ref 5–15)
BUN: 35 mg/dL — ABNORMAL HIGH (ref 8–23)
CO2: 24 mmol/L (ref 22–32)
Calcium: 8.8 mg/dL — ABNORMAL LOW (ref 8.9–10.3)
Chloride: 105 mmol/L (ref 98–111)
Creatinine, Ser: 1.36 mg/dL — ABNORMAL HIGH (ref 0.44–1.00)
GFR calc Af Amer: 43 mL/min — ABNORMAL LOW (ref >60)
GFR calc non Af Amer: 37 mL/min — ABNORMAL LOW (ref >60)
Glucose, Bld: 93 mg/dL (ref 70–99)
Potassium: 4.5 mmol/L (ref 3.5–5.1)
Sodium: 140 mmol/L (ref 135–145)

## 2020-01-18 ENCOUNTER — Non-Acute Institutional Stay (SKILLED_NURSING_FACILITY): Payer: PPO | Admitting: Adult Health

## 2020-01-18 ENCOUNTER — Encounter: Payer: Self-pay | Admitting: Adult Health

## 2020-01-18 DIAGNOSIS — I119 Hypertensive heart disease without heart failure: Secondary | ICD-10-CM | POA: Diagnosis not present

## 2020-01-18 DIAGNOSIS — M9711XD 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, subsequent encounter: Secondary | ICD-10-CM

## 2020-01-18 DIAGNOSIS — M0579 Rheumatoid arthritis with rheumatoid factor of multiple sites without organ or systems involvement: Secondary | ICD-10-CM | POA: Diagnosis not present

## 2020-01-18 DIAGNOSIS — S72451D Displaced supracondylar fracture without intracondylar extension of lower end of right femur, subsequent encounter for closed fracture with routine healing: Secondary | ICD-10-CM | POA: Diagnosis not present

## 2020-01-18 NOTE — Progress Notes (Signed)
Location:    [place] Room Number: 150/P Place of Service:  SNF (31)   CODE STATUS: Full Code  Allergies  Allergen Reactions  . Ativan [Lorazepam] Other (See Comments)    Highly irritable  . Haldol [Haloperidol] Other (See Comments)    Vegetative State  . Epinephrine Other (See Comments)    Reaction:  Increased pts BP and caused shoulder to jerk   . Levofloxacin Nausea And Vomiting  . Lipitor [Atorvastatin] Other (See Comments)    Reaction:  Joint pain and dizziness   . Trovan [Alatrofloxacin] Nausea And Vomiting    Pt states that this med caused pancreatitis.    Chief Complaint  Patient presents with  . 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      HPI:  She is a 79 year old short term rehab patient being seen for the management of her chronic illnesses: 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 subsequent encounter./closed supracondylar fracture of right femur with routine healing subsequent encounter:  . 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 No reports of heart burn or constipation no reports of anxiety. Continues to participate in therapy.   Past Medical History:  Diagnosis Date  . Anemia   . Anxiety   . Arthritis   . Atelectasis, left    left lower lung  . Blood transfusion without reported diagnosis   . Cataract cortical, senile, bilateral 03/19/2018  . Complication of anesthesia 2012   "irritated trachea" with cough x 1  year from intubation 2012.  smaller tube used with other surgeries  . Depression    just lost her son a month ago  . Esophageal hernia   . Fibromyalgia   . Headache    occasionally  . HTN (hypertension)   . Hx of colonic polyps   . Hydronephrosis of right kidney   . Hypercholesterolemia   . Hypertensive retinopathy of both eyes 03/19/2018  . Neuropathy due to medical condition ([place])    bilateral lower legs  . Obesity   . 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 Osteoporosis   . Renal angiomyolipoma   . Rheumatoid arthritis [facility])     Past Surgical History:  Procedure Laterality Date  . ABDOMINAL HYSTERECTOMY  1969  . BACK SURGERY  2012   rod in back-  lumbar disc removal with bone graft  . blephoroplasty     bilateral  . BREAST BIOPSY  1989  . CHOLECYSTECTOMY  2010  . EXCISIONAL TOTAL KNEE ARTHROPLASTY WITH ANTIBIOTIC SPACERS Left 11/14/2016   Procedure: EXCISIONAL LEFT TOTAL KNEE ARTHROPLASTY WITH  PLACEMENT OF ANTIBIOTIC SPACERS;  Surgeon: [name], MD;  [facility];  Service: Orthopedics;  Laterality: Left;  . JOINT REPLACEMENT    . NISSEN FUNDOPLICATION    . ORIF FEMUR FRACTURE Right 01/04/2020   Procedure: OPEN REDUCTION INTERNAL FIXATION (ORIF) DISTAL FEMUR FRACTURE;  Surgeon: Shona Needles, MD;  Location: [place];  Service: Orthopedics;  Laterality: Right;  . PARAESOPHAGEAL HERNIA REPAIR  1989  . REVISION TOTAL  KNEE ARTHROPLASTY Left 06/09/2017  . TONSILLECTOMY  1946  . TOTAL HIP ARTHROPLASTY Right 08/31/2012   Procedure: TOTAL HIP ARTHROPLASTY ANTERIOR APPROACH;  Surgeon: Mauri Pole, MD;  [facility];  Service: Orthopedics;  Laterality: Right;  . TOTAL KNEE ARTHROPLASTY  2001   left  . TOTAL KNEE ARTHROPLASTY  2010   right  . TOTAL KNEE REVISION Left 06/09/2017   Procedure: LEFT REVISION KNEE ARTHROPLASTY;  Surgeon: [name], MD;  Location: [place];  Service: Orthopedics;  Laterality: Left;    Social  History   Socioeconomic History  . Marital status: Divorced    Spouse name: Not on file  . Number of children: 3  . Years of education: College  . Highest education level: Not on file  Occupational History    Employer: [employer]    Comment: Dr. Minna Merritts  Tobacco Use  . Smoking status: Never Smoker  . Smokeless tobacco: Never Used  Vaping Use  . Vaping Use: Never used  Substance and Sexual Activity  . Alcohol use: No  . Drug use: No  . Sexual activity: Never  Other Topics Concern  . Not on file  Social History Narrative   Patient lives at home alone.   Caffeine Use: 1 cup daily   Social Determinants of Health   Financial Resource Strain:   . Difficulty of Paying Living Expenses:   Food Insecurity:   . Worried About Charity fundraiser in the Last Year:   . Arboriculturist in the Last Year:   Transportation Needs:   . Film/video editor (Medical):   Marland Kitchen Lack of Transportation (Non-Medical):   Physical Activity:   . Days of Exercise per Week:   . Minutes of Exercise per Session:   Stress:   . Feeling of Stress :   Social Connections:   . Frequency of Communication with Friends and Family:   . Frequency of Social Gatherings with Friends and Family:   . Attends Religious Services:   . Active Member of Clubs or Organizations:   . Attends Archivist Meetings:   Marland Kitchen Marital Status:   Intimate Partner Violence:   . Fear of Current or Ex-Partner:   . Emotionally Abused:   Marland Kitchen Physically Abused:   . Sexually Abused:    Family History  Problem Relation Age of Onset  . Diabetes Father   . Osteoarthritis Father   . Heart disease Father   . Stroke Father   . Osteoarthritis Mother   . Stroke Mother   . Pneumonia Mother   . Colon cancer Sister 9  . Colon polyps Other        neice/nephew  . Diabetes Sister   . Diabetes Brother       VITAL SIGNS BP 139/72   Pulse 67   Temp 98.2 F (36.8 C) (Oral)   Resp 18   Ht 5\' 6"  (1.676 m)   Wt 218 lb  12.8 oz (99.2 kg)   SpO2 95%   BMI 35.32 kg/m   Outpatient Encounter Medications as of 01/18/2020  Medication Sig  . acetaminophen (TYLENOL) 650 MG CR tablet Take 650 mg by mouth every 6 (six) hours.  Marland Kitchen atenolol (TENORMIN) 50 MG tablet TAKE 1 TABLET BY MOUTH EVERY DAY  . Calcium Carbonate-Vitamin D (CALCIUM 600+D) 600-400 MG-UNIT tablet Take 1 tablet by mouth every other day.  . docusate sodium (COLACE) 100 MG capsule Take 1 capsule (100 mg total) by mouth 2 (  two) times daily.  Marland Kitchen enoxaparin (LOVENOX) 30 MG/0.3ML injection Inject 0.3 mLs (30 mg total) into the skin daily.  . ferrous sulfate 325 (65 FE) MG EC tablet Take 325 mg by mouth daily with breakfast.   . FLUoxetine (PROZAC) 20 MG capsule TAKE 1 CAPSULE BY MOUTH EVERY DAY IN THE MORNING  . folic acid (FOLVITE) 734 MCG tablet Take 800 mcg daily by mouth.   . furosemide (LASIX) 40 MG tablet Take 40 mg by mouth daily as needed. As needed for weight gain >/= 3 pounds. Notify provider if meds given.  . gabapentin (NEURONTIN) 300 MG capsule TAKE 1 CAPSULE BY MOUTH 2 TIMES DAILY  . methocarbamol (ROBAXIN) 500 MG tablet Take 1 tablet (500 mg total) by mouth every 6 (six) hours as needed for muscle spasms.  . Multiple Vitamin (MULTIVITAMIN WITH MINERALS) TABS tablet Take 1 tablet by mouth daily.   . NON FORMULARY Diet: _____ Regular, __x____ NAS, _______Consistent Carbohydrate, _______NPO _____Other  . Omega-3 1000 MG CAPS Take 1 capsule by mouth daily.   . polyethylene glycol (MIRALAX / GLYCOLAX) 17 g packet Take 17 g by mouth 2 (two) times daily.  . rosuvastatin (CRESTOR) 10 MG tablet TAKE 1 TABLET BY MOUTH EVERY DAY  . tamsulosin (FLOMAX) 0.4 MG CAPS capsule Take 0.4 mg by mouth daily. Give after same meal each day. Do not open, crush or chew tabs.  . [DISCONTINUED] OXYGEN Inhale 2 L into the lungs continuous.   No facility-administered encounter medications on file as of 01/18/2020.     SIGNIFICANT DIAGNOSTIC EXAMS   PREVIOUS    01-03-20: right knee x-ray: 1. Comminuted and displaced periprosthetic distal right femur fracture.  01-03-20: right hip x-ray: 1. No acute pelvic or right hip fracture.   01-03-20: right tibia/fibula x-ray: 1. No acute fracture of the right tibia or fibula   NO NEW EXAMS.   LABS REVIEWED PREVIOUS   01-03-20: wbc 18.4; hgb 12.6; hct 40.3; mcv 92.6 plt 240; glucose 157; bun 34; creat 1.75; k+ 4.5; na++ 143; ca 9.4 01-05-20: vit D 35.04 01-06-20: wbc 10.5; hgb 8.5; hct 27.9; mcv 95.5 plt 180; glucose 92; bun 30; creat 1.59; k+ 4.2; na++ 137; ca 8.1 01-08-20: wbc 8.3; hgb 8.3; hct 27.5; mcv 94.8 plt 183 01-09-20: hgb 8.4; hct 27.6; glucose 114; bun 20; creat 1.16; k+ 5.0; na++ 137; ca 8.7   TODAY  01-17-20; wbc 7.6; hgb 8.8; hct 28.7; mcv 97.0 plt 311; glucose 93; bun 35; creat 1.36; k+ 4.5; na++ 140; ca 8.8   Review of Systems  Constitutional: Negative for malaise/fatigue.  Respiratory: Negative for cough and shortness of breath.   Cardiovascular: Negative for chest pain, palpitations and leg swelling.  Gastrointestinal: Negative for abdominal pain, constipation and heartburn.  Musculoskeletal: Negative for back pain, joint pain and myalgias.  Skin: Negative.   Neurological: Negative for dizziness.  Psychiatric/Behavioral: The patient is not nervous/anxious.     Physical Exam Constitutional:      General: She is not in acute distress.    Appearance: She is well-developed. She is not diaphoretic.  Neck:     Thyroid: No thyromegaly.  Cardiovascular:     Rate and Rhythm: Normal rate and regular rhythm.     Pulses: Normal pulses.     Heart sounds: Normal heart sounds.  Pulmonary:     Effort: Pulmonary effort is normal. No respiratory distress.     Breath sounds: Normal breath sounds.  Abdominal:     General: Bowel sounds  are normal. There is no distension.     Palpations: Abdomen is soft.     Tenderness: There is no abdominal tenderness.  Musculoskeletal:     Cervical back: Neck  supple.     Right lower leg: No edema.     Left lower leg: No edema.     Comments:  Able to move all extremities  Is status post ORIF distal right femur 01-04-20     Lymphadenopathy:     Cervical: No cervical adenopathy.  Skin:    General: Skin is warm and dry.  Neurological:     Mental Status: She is alert and oriented to person, place, and time.  Psychiatric:        Mood and Affect: Mood normal.        ASSESSMENT/ PLAN:  TODAY  1. 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 subsequent encounter./closed supracondylar fracture of right femur with routine healing subsequent encounter: is stable will continue therapy as directed and will follow up with orthopedics. Will continue lovenox 30 mg daily through 02-09-20; will continue tylenol cr 650 mg every 6 hours and robaxin 500 mg every 6 hours.   2. 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 Benign hypertensive heart disease without heart failure: is stable b/p 139/72 will continue atenolol 50 mg every other day.   PREVIOUS   4. Other polyneuropathy: is stable will continue gabapentin 300 mg twice daily   5. Chronic kidney disease (CKD) active medical management without dialysis stage 3: bun 20; creat 1.16   6. Reactive depression: is stable will continue prozac 20 mg daily   7. Acute blood loss anemia: is stable hgb 8.4 will continue iron daily   8. Dyslipidemia: is stable will continue crestor 10 mg every other day   9. Chronic constipation: is stable will continue colace twice daily miralax twice daily   10. Urine retention: is stable will continue flomax 0.4 mg daily    MD is aware of resident's narcotic use and is in agreement with current plan of care. We will attempt to wean resident as appropriate.  Ok Edwards NP [facility]  Contact [phone] Monday through Friday 8am- 5pm    After hours call [phone]

## 2020-01-30 ENCOUNTER — Other Ambulatory Visit: Payer: Self-pay | Admitting: Adult Health

## 2020-01-30 ENCOUNTER — Non-Acute Institutional Stay (SKILLED_NURSING_FACILITY): Payer: PPO | Admitting: Adult Health

## 2020-01-30 ENCOUNTER — Other Ambulatory Visit: Payer: Self-pay | Admitting: Family Medicine

## 2020-01-30 ENCOUNTER — Encounter: Payer: Self-pay | Admitting: Adult Health

## 2020-01-30 DIAGNOSIS — M9711XD 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, subsequent encounter: Secondary | ICD-10-CM

## 2020-01-30 DIAGNOSIS — M0579 Rheumatoid arthritis with rheumatoid factor of multiple sites without organ or systems involvement: Secondary | ICD-10-CM

## 2020-01-30 DIAGNOSIS — N179 Acute kidney failure, unspecified: Secondary | ICD-10-CM

## 2020-01-30 DIAGNOSIS — I10 Essential (primary) hypertension: Secondary | ICD-10-CM | POA: Diagnosis not present

## 2020-01-30 DIAGNOSIS — S72451D Displaced supracondylar fracture without intracondylar extension of lower end of right femur, subsequent encounter for closed fracture with routine healing: Secondary | ICD-10-CM | POA: Diagnosis not present

## 2020-01-30 DIAGNOSIS — N183 Chronic kidney disease, stage 3 unspecified: Secondary | ICD-10-CM

## 2020-01-30 DIAGNOSIS — E785 Hyperlipidemia, unspecified: Secondary | ICD-10-CM

## 2020-01-30 DIAGNOSIS — F329 Major depressive disorder, single episode, unspecified: Secondary | ICD-10-CM

## 2020-01-30 MED ORDER — ATENOLOL 50 MG PO TABS: 50.0000 mg | ORAL_TABLET | Freq: Every day | ORAL | 0 refills | Status: DC

## 2020-01-30 MED ORDER — TAMSULOSIN HCL 0.4 MG PO CAPS: 0.4000 mg | ORAL_CAPSULE | Freq: Every day | ORAL | 0 refills | Status: DC

## 2020-01-30 MED ORDER — GABAPENTIN 300 MG PO CAPS: 300.0000 mg | ORAL_CAPSULE | Freq: Three times a day (TID) | ORAL | 0 refills | Status: DC

## 2020-01-30 MED ORDER — METHOCARBAMOL 500 MG PO TABS: 500.0000 mg | ORAL_TABLET | Freq: Four times a day (QID) | ORAL | 0 refills | Status: DC | PRN

## 2020-01-30 MED ORDER — ROSUVASTATIN CALCIUM 10 MG PO TABS: 10.0000 mg | ORAL_TABLET | Freq: Every day | ORAL | 0 refills | Status: DC

## 2020-01-30 MED ORDER — FUROSEMIDE 40 MG PO TABS: 40.0000 mg | ORAL_TABLET | Freq: Every day | ORAL | 0 refills | Status: DC | PRN

## 2020-01-30 MED ORDER — FERROUS SULFATE 325 (65 FE) MG PO TBEC: 325.0000 mg | DELAYED_RELEASE_TABLET | Freq: Every day | ORAL | 0 refills | Status: DC

## 2020-01-30 MED ORDER — FLUOXETINE HCL 20 MG PO CAPS: ORAL_CAPSULE | ORAL | 0 refills | Status: DC

## 2020-01-30 NOTE — Progress Notes (Signed)
Location:    [facility] Room Number: 150/P Place of Service:  SNF (31)    CODE STATUS: Full Code  Allergies  Allergen Reactions  . Ativan [Lorazepam] Other (See Comments)    Highly irritable  . Haldol [Haloperidol] Other (See Comments)    Vegetative State  . Epinephrine Other (See Comments)    Reaction:  Increased pts BP and caused shoulder to jerk   . Levofloxacin Nausea And Vomiting  . Lipitor [Atorvastatin] Other (See Comments)    Reaction:  Joint pain and dizziness   . Trovan [Alatrofloxacin] Nausea And Vomiting    Pt states that this med caused pancreatitis.    Chief Complaint  Patient presents with  . Discharge Note    Discharge Visit    HPI:  She is being discharged to home with home health for pt/ot. She will not need any dme. She will need her prescriptions written and will need to follow up with her medical provider. She had been hospitalized for a periprosthetic fracture of right femur. She was admitted to this facility for short term rehab. She has participated in pt/ot and is now ready to complete her therapy on a home health basis.    Past Medical History:  Diagnosis Date  . Anemia   . Anxiety   . Arthritis   . Atelectasis, left    left lower lung  . Blood transfusion without reported diagnosis   . Cataract cortical, senile, bilateral 03/19/2018  . Complication of anesthesia 2012   "irritated trachea" with cough x 1 year from intubation 2012.  smaller tube used with other surgeries  . Depression    just lost her son a month ago  . Esophageal hernia   . Fibromyalgia   . Headache    occasionally  . HTN (hypertension)   . Hx of colonic polyps   . Hydronephrosis of right kidney   . Hypercholesterolemia   . Hypertensive retinopathy of both eyes 03/19/2018  . Neuropathy due to medical condition ([place])    bilateral lower legs  . Obesity   . 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 Osteoporosis   . Renal  angiomyolipoma   . Rheumatoid arthritis [facility])     Past Surgical History:  Procedure Laterality Date  . ABDOMINAL HYSTERECTOMY  1969  . BACK SURGERY  2012   rod in back-  lumbar disc removal with bone graft  . blephoroplasty     bilateral  . BREAST BIOPSY  1989  . CHOLECYSTECTOMY  2010  . EXCISIONAL TOTAL KNEE ARTHROPLASTY WITH ANTIBIOTIC SPACERS Left 11/14/2016   Procedure: EXCISIONAL LEFT TOTAL KNEE ARTHROPLASTY WITH  PLACEMENT OF ANTIBIOTIC SPACERS;  Surgeon: [name], MD;  Location: [facility];  Service: Orthopedics;  Laterality: Left;  . JOINT REPLACEMENT    . NISSEN FUNDOPLICATION    . ORIF FEMUR FRACTURE Right 01/04/2020   Procedure: OPEN REDUCTION INTERNAL FIXATION (ORIF) DISTAL FEMUR FRACTURE;  Surgeon: Shona Needles, MD;  Location: [facility];  Service: Orthopedics;  Laterality: Right;  . PARAESOPHAGEAL HERNIA REPAIR  1989  . REVISION TOTAL KNEE ARTHROPLASTY Left 06/09/2017  . TONSILLECTOMY  1946  . TOTAL HIP ARTHROPLASTY Right 08/31/2012   Procedure: TOTAL HIP ARTHROPLASTY ANTERIOR APPROACH;  Surgeon: Mauri Pole, MD;  Location: [facility];  Service: Orthopedics;  Laterality: Right;  . TOTAL KNEE ARTHROPLASTY  2001   left  . TOTAL KNEE ARTHROPLASTY  2010   right  . TOTAL  KNEE REVISION Left 06/09/2017   Procedure: LEFT REVISION KNEE ARTHROPLASTY;  Surgeon: [name], MD;  Location: [facility];  Service: Orthopedics;  Laterality: Left;    Social History   Socioeconomic History  . Marital status: Divorced    Spouse name: Not on file  . Number of children: 3  . Years of education: College  . 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 Smoking status: Never Smoker  . Smokeless tobacco: Never Used  Vaping Use  . Vaping Use: Never used  Substance and Sexual Activity  . Alcohol use: No  . Drug use: No  . Sexual activity: Never  Other Topics Concern  . Not on file    Social History Narrative   Patient lives at home alone.   Caffeine Use: 1 cup daily   Social Determinants of Health   Financial Resource Strain:   . Difficulty of Paying Living Expenses:   Food Insecurity:   . Worried About Charity fundraiser in the Last Year:   . Arboriculturist in the Last Year:   Transportation Needs:   . Film/video editor (Medical):   Marland Kitchen Lack of Transportation (Non-Medical):   Physical Activity:   . Days of Exercise per Week:   . Minutes of Exercise per Session:   Stress:   . Feeling of Stress :   Social Connections:   . Frequency of Communication with Friends and Family:   . Frequency of Social Gatherings with Friends and Family:   . Attends Religious Services:   . Active Member of Clubs or Organizations:   . Attends Archivist Meetings:   Marland Kitchen Marital Status:   Intimate Partner Violence:   . Fear of Current or Ex-Partner:   . Emotionally Abused:   Marland Kitchen Physically Abused:   . Sexually Abused:    Family History  Problem Relation Age of Onset  . Diabetes Father   . Osteoarthritis Father   . Heart disease Father   . Stroke Father   . Osteoarthritis Mother   . Stroke Mother   . Pneumonia Mother   . Colon cancer Sister 28  . Colon polyps Other        neice/nephew  . Diabetes Sister   . Diabetes Brother     VITAL SIGNS BP (!) 162/68   Pulse 72   Temp 98.8 F (37.1 C) (Oral)   Resp 18   Ht 5\' 6"  (1.676 m)   Wt (!) 216 lb (98 kg)   SpO2 95%   BMI 34.86 kg/m   Patient's Medications  New Prescriptions   No medications on file  Previous Medications   ACETAMINOPHEN (TYLENOL) 650 MG CR TABLET    Take 650 mg by mouth every 6 (six) hours.   ATENOLOL (TENORMIN) 50 MG TABLET    TAKE 1 TABLET BY MOUTH EVERY DAY   CALCIUM CARBONATE-VITAMIN D (CALCIUM 600+D) 600-400 MG-UNIT TABLET    Take 1 tablet by mouth every other day.   DOCUSATE SODIUM (COLACE) 100 MG CAPSULE    Take 1 capsule (100 mg total) by mouth 2 (two) times daily.    ENOXAPARIN (LOVENOX) 30 MG/0.3ML INJECTION    Inject 0.3 mLs (30 mg total) into the skin daily.   FERROUS SULFATE 325 (65 FE) MG EC TABLET    Take 325 mg by mouth daily with breakfast.  FLUOXETINE (PROZAC) 20 MG CAPSULE    TAKE 1 CAPSULE BY MOUTH EVERY DAY IN THE MORNING   FOLIC ACID (FOLVITE) 297 MCG TABLET    Take 800 mcg daily by mouth.    FUROSEMIDE (LASIX) 40 MG TABLET    Take 40 mg by mouth daily as needed. As needed for weight gain >/= 3 pounds. Notify provider if meds given.   GABAPENTIN (NEURONTIN) 300 MG CAPSULE    Take 300 mg by mouth 3 (three) times daily.   METHOCARBAMOL (ROBAXIN) 500 MG TABLET    Take 1 tablet (500 mg total) by mouth every 6 (six) hours as needed for muscle spasms.   MULTIPLE VITAMIN (MULTIVITAMIN WITH MINERALS) TABS TABLET    Take 1 tablet by mouth daily.    NON FORMULARY    Diet: _____ Regular, __x____ NAS, _______Consistent Carbohydrate, _______NPO _____Other   OMEGA-3 1000 MG CAPS    Take 1 capsule by mouth daily.    POLYETHYLENE GLYCOL (MIRALAX / GLYCOLAX) 17 G PACKET    Take 17 g by mouth 2 (two) times daily.   ROSUVASTATIN (CRESTOR) 10 MG TABLET    TAKE 1 TABLET BY MOUTH EVERY DAY   TAMSULOSIN (FLOMAX) 0.4 MG CAPS CAPSULE    Take 0.4 mg by mouth daily. Give after same meal each day. Do not open, crush or chew tabs.  Modified Medications   No medications on file  Discontinued Medications   GABAPENTIN (NEURONTIN) 300 MG CAPSULE    TAKE 1 CAPSULE BY MOUTH 2 TIMES DAILY     SIGNIFICANT DIAGNOSTIC EXAMS  PREVIOUS   01-03-20: right knee x-ray: 1. Comminuted and displaced periprosthetic distal right femur fracture.  01-03-20: right hip x-ray: 1. No acute pelvic or right hip fracture.   01-03-20: right tibia/fibula x-ray: 1. No acute fracture of the right tibia or fibula   NO NEW EXAMS.   LABS REVIEWED PREVIOUS   01-03-20: wbc 18.4; hgb 12.6; hct 40.3; mcv 92.6 plt 240; glucose 157; bun 34; creat 1.75; k+ 4.5; na++ 143; ca 9.4 01-05-20: vit D  35.04 01-06-20: wbc 10.5; hgb 8.5; hct 27.9; mcv 95.5 plt 180; glucose 92; bun 30; creat 1.59; k+ 4.2; na++ 137; ca 8.1 01-08-20: wbc 8.3; hgb 8.3; hct 27.5; mcv 94.8 plt 183 01-09-20: hgb 8.4; hct 27.6; glucose 114; bun 20; creat 1.16; k+ 5.0; na++ 137; ca 8.7  01-17-20; wbc 7.6; hgb 8.8; hct 28.7; mcv 97.0 plt 311; glucose 93; bun 35; creat 1.36; k+ 4.5; na++ 140; ca 8.8   NO NEW LABS.   Review of Systems  Constitutional: Negative for malaise/fatigue.  Respiratory: Negative for cough and shortness of breath.   Cardiovascular: Negative for chest pain, palpitations and leg swelling.  Gastrointestinal: Negative for abdominal pain, constipation and heartburn.  Musculoskeletal: Negative for back pain, joint pain and myalgias.  Skin: Negative.   Neurological: Negative for dizziness.  Psychiatric/Behavioral: The patient is not nervous/anxious.     Physical Exam Constitutional:      General: She is not in acute distress.    Appearance: She is well-developed. She is not diaphoretic.  Neck:     Thyroid: No thyromegaly.  Cardiovascular:     Rate and Rhythm: Normal rate and regular rhythm.     Pulses: Normal pulses.     Heart sounds: Normal heart sounds.  Pulmonary:     Effort: Pulmonary effort is normal. No respiratory distress.     Breath sounds: Normal breath sounds.  Abdominal:     General:  Bowel sounds are normal. There is no distension.     Palpations: Abdomen is soft.     Tenderness: There is no abdominal tenderness.  Musculoskeletal:     Cervical back: Neck supple.     Right lower leg: No edema.     Left lower leg: No edema.     Comments: Able to move all extremities  Is status post ORIF distal right femur 01-04-20      Lymphadenopathy:     Cervical: No cervical adenopathy.  Skin:    General: Skin is warm and dry.  Neurological:     Mental Status: She is alert and oriented to person, place, and time.  Psychiatric:        Mood and Affect: Mood normal.       ASSESSMENT/  PLAN:   Patient is being discharged with the following home health services:  Pt/ot to evaluate and treat as indicated for gait balance strength adl training.   Patient is being discharged with the following durable medical equipment:  None needed   Patient has been advised to f/u with their PCP in 1-2 weeks to bring them up to date on their rehab stay.  Social services at facility was responsible for arranging this appointment.  Pt was provided with a 30 day supply of prescriptions for medications and refills must be obtained from their PCP.  For controlled substances, a more limited supply may be provided adequate until PCP appointment only.   [facility] [place]  Time spent with patient: 35 minutes: dme; medications; home health.     Ok Edwards NP [facility]  Contact [phone] Monday through Friday 8am- 5pm  After hours call [phone]

## 2020-01-31 NOTE — Telephone Encounter (Signed)
Please review

## 2020-02-02 ENCOUNTER — Telehealth: Payer: Self-pay

## 2020-02-02 NOTE — Telephone Encounter (Signed)
Patient recently discharge from hospital and SNF due to periprosthetic fracture right knee. Patient is wheelchair bound and does not feel comfortable leaving home. She has been advised to schedule f/u with PCP with 1-2 weeks from discharge so that PCP can take over meds and home health orders. Patient has been scheduled for 8/9 @430  - requesting this be virtual visit. Patient advised that message will be sent to PCP to see if in office appt is required. Please advise

## 2020-02-03 ENCOUNTER — Other Ambulatory Visit: Payer: Self-pay | Admitting: Family Medicine

## 2020-02-03 DIAGNOSIS — I10 Essential (primary) hypertension: Secondary | ICD-10-CM

## 2020-02-03 NOTE — Telephone Encounter (Signed)
Ok to be virtual

## 2020-02-03 NOTE — Telephone Encounter (Signed)
Please advise if OK to refill script

## 2020-02-03 NOTE — Telephone Encounter (Signed)
Please let patient know that appt is virtual and change appt type

## 2020-02-07 DIAGNOSIS — Z86018 Personal history of other benign neoplasm: Secondary | ICD-10-CM | POA: Diagnosis not present

## 2020-02-07 DIAGNOSIS — Z6831 Body mass index (BMI) 31.0-31.9, adult: Secondary | ICD-10-CM | POA: Diagnosis not present

## 2020-02-07 DIAGNOSIS — J9811 Atelectasis: Secondary | ICD-10-CM | POA: Diagnosis not present

## 2020-02-07 DIAGNOSIS — F419 Anxiety disorder, unspecified: Secondary | ICD-10-CM | POA: Diagnosis not present

## 2020-02-07 DIAGNOSIS — K449 Diaphragmatic hernia without obstruction or gangrene: Secondary | ICD-10-CM | POA: Diagnosis not present

## 2020-02-07 DIAGNOSIS — Z96652 Presence of left artificial knee joint: Secondary | ICD-10-CM | POA: Diagnosis not present

## 2020-02-07 DIAGNOSIS — M797 Fibromyalgia: Secondary | ICD-10-CM | POA: Diagnosis not present

## 2020-02-07 DIAGNOSIS — E669 Obesity, unspecified: Secondary | ICD-10-CM | POA: Diagnosis not present

## 2020-02-07 DIAGNOSIS — N189 Chronic kidney disease, unspecified: Secondary | ICD-10-CM | POA: Diagnosis not present

## 2020-02-07 DIAGNOSIS — F329 Major depressive disorder, single episode, unspecified: Secondary | ICD-10-CM | POA: Diagnosis not present

## 2020-02-07 DIAGNOSIS — Z86010 Personal history of colonic polyps: Secondary | ICD-10-CM | POA: Diagnosis not present

## 2020-02-07 DIAGNOSIS — Z96641 Presence of right artificial hip joint: Secondary | ICD-10-CM | POA: Diagnosis not present

## 2020-02-07 DIAGNOSIS — M810 Age-related osteoporosis without current pathological fracture: Secondary | ICD-10-CM | POA: Diagnosis not present

## 2020-02-07 DIAGNOSIS — G629 Polyneuropathy, unspecified: Secondary | ICD-10-CM | POA: Diagnosis not present

## 2020-02-07 DIAGNOSIS — Z9181 History of falling: Secondary | ICD-10-CM | POA: Diagnosis not present

## 2020-02-07 DIAGNOSIS — D631 Anemia in chronic kidney disease: Secondary | ICD-10-CM | POA: Diagnosis not present

## 2020-02-07 DIAGNOSIS — M9711XD 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, subsequent encounter: Secondary | ICD-10-CM | POA: Diagnosis not present

## 2020-02-07 DIAGNOSIS — H35033 Hypertensive retinopathy, bilateral: Secondary | ICD-10-CM | POA: Diagnosis not present

## 2020-02-07 DIAGNOSIS — Z7982 Long term (current) use of aspirin: Secondary | ICD-10-CM | POA: Diagnosis not present

## 2020-02-07 DIAGNOSIS — H25013 Cortical age-related cataract, bilateral: Secondary | ICD-10-CM | POA: Diagnosis not present

## 2020-02-07 DIAGNOSIS — E7800 Pure hypercholesterolemia, unspecified: Secondary | ICD-10-CM | POA: Diagnosis not present

## 2020-02-07 DIAGNOSIS — N1330 Unspecified hydronephrosis: Secondary | ICD-10-CM | POA: Diagnosis not present

## 2020-02-07 DIAGNOSIS — M069 Rheumatoid arthritis, unspecified: Secondary | ICD-10-CM | POA: Diagnosis not present

## 2020-02-07 DIAGNOSIS — I129 Hypertensive chronic kidney disease with stage 1 through stage 4 chronic kidney disease, or unspecified chronic kidney disease: Secondary | ICD-10-CM | POA: Diagnosis not present

## 2020-02-07 DIAGNOSIS — S72351D Displaced comminuted fracture of shaft of right femur, subsequent encounter for closed fracture with routine healing: Secondary | ICD-10-CM | POA: Diagnosis not present

## 2020-02-13 ENCOUNTER — Telehealth: Payer: PPO | Admitting: Family Medicine

## 2020-02-14 ENCOUNTER — Other Ambulatory Visit: Payer: Self-pay

## 2020-02-14 ENCOUNTER — Telehealth (INDEPENDENT_AMBULATORY_CARE_PROVIDER_SITE_OTHER): Payer: PPO | Admitting: Family Medicine

## 2020-02-14 ENCOUNTER — Encounter: Payer: Self-pay | Admitting: Family Medicine

## 2020-02-14 DIAGNOSIS — E785 Hyperlipidemia, unspecified: Secondary | ICD-10-CM

## 2020-02-14 DIAGNOSIS — D62 Acute posthemorrhagic anemia: Secondary | ICD-10-CM | POA: Diagnosis not present

## 2020-02-14 DIAGNOSIS — F329 Major depressive disorder, single episode, unspecified: Secondary | ICD-10-CM

## 2020-02-14 DIAGNOSIS — N183 Chronic kidney disease, stage 3 unspecified: Secondary | ICD-10-CM | POA: Diagnosis not present

## 2020-02-14 DIAGNOSIS — M0579 Rheumatoid arthritis with rheumatoid factor of multiple sites without organ or systems involvement: Secondary | ICD-10-CM | POA: Diagnosis not present

## 2020-02-14 DIAGNOSIS — M21372 Foot drop, left foot: Secondary | ICD-10-CM

## 2020-02-14 DIAGNOSIS — M9711XD 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, subsequent encounter: Secondary | ICD-10-CM

## 2020-02-14 DIAGNOSIS — I10 Essential (primary) hypertension: Secondary | ICD-10-CM | POA: Diagnosis not present

## 2020-02-14 DIAGNOSIS — G6289 Other specified polyneuropathies: Secondary | ICD-10-CM | POA: Diagnosis not present

## 2020-02-14 MED ORDER — POLYETHYLENE GLYCOL 3350 17 G PO PACK: 17.0000 g | PACK | ORAL | Status: DC | PRN

## 2020-02-14 MED ORDER — DOCUSATE SODIUM 100 MG PO CAPS: 100.0000 mg | ORAL_CAPSULE | Freq: Every day | ORAL | Status: DC

## 2020-02-14 MED ORDER — ROSUVASTATIN CALCIUM 10 MG PO TABS: 10.0000 mg | ORAL_TABLET | Freq: Every day | ORAL | 3 refills | Status: DC

## 2020-02-14 MED ORDER — FLUOXETINE HCL 20 MG PO CAPS: 20.0000 mg | ORAL_CAPSULE | Freq: Every day | ORAL | 3 refills | Status: DC

## 2020-02-14 NOTE — Patient Instructions (Signed)
Please return in 3 months for hypertension and recheck.   If you have any questions or concerns, please don't hesitate to send me a message via MyChart or call the office at [phone]. Thank you for visiting with [place] today! It's our pleasure caring for you.

## 2020-02-14 NOTE — Progress Notes (Signed)
Virtual Visit via Video Note  Subjective  CC:  Chief Complaint  Patient presents with  . Hospitalization Follow-up    periprosthetic fracture. Patient states that she is doing well. Patient is not taking Robaxin due to her feeling dizzy after taking it.      I connected with MERIBETH VITUG on 02/14/20 at 10:00 AM EDT by a video enabled telemedicine application and verified that I am speaking with the correct person using two identifiers. Location patient: Home Location provider: [facility], Office Persons participating in the virtual visit: DAEJA HELDERMAN, Leamon Arnt, MD Reymundo Poll CMA  I discussed the limitations of evaluation and management by telemedicine and the availability of in person appointments. The patient expressed understanding and agreed to proceed. HPI: Laura Chambers is a 79 y.o. female who was contacted today to address the problems listed above in the chief complaint. . This is a hospital follow-up.  Patient was recently discharged from a nursing home after a hospitalization for a periprosthetic femur fracture that required surgical repair.  Her hospital course was complicated by acute blood loss anemia which has improved or stabilized.  She has been receiving physical therapy at home and doing well.  She reports she is recovering well.  She is not in any pain.  Still in a wheelchair.  Has follow-up with her orthopedic surgeon next week . Depression: Stable in spite of recent events.  Needs refill on Prozac. Marland Kitchen Anemia: Reviewed most recent lab work.  Hemoglobin up to 8.8.  Her baseline is in the tens.  She is on iron.  She is taking Colace regularly.  No abdominal pain. Marland Kitchen Hypertension: She reports good control while in the nursing home. Marland Kitchen Hyperlipidemia: Needs refill of her medications. . Chronic kidney disease: Monitored closely while in the hospital.  At baseline upon discharge Assessment  1. Periprosthetic fracture  around internal prosthetic right knee joint, subsequent encounter   2. Reactive depression (situational)   3. Dyslipidemia   4. Acute blood loss anemia   5. Left foot drop   6. Rheumatoid arthritis involving multiple sites with positive rheumatoid factor (HCC)   7. Other polyneuropathy   8. Essential hypertension   9. Chronic kidney disease (CKD), active medical management without dialysis, stage 3 (moderate)      Plan   Hospital follow-up for periprosthetic fracture femur: Recovering well.  Continue home physical therapy.  Follow-up with orthopedic surgery.  Agree with current medications and refills done appropriately.  Rec follow-up with me in office for exam and lab work in 3 months I discussed the assessment and treatment plan with the patient. The patient was provided an opportunity to ask questions and all were answered. The patient agreed with the plan and demonstrated an understanding of the instructions.   The patient was advised to call back or seek an in-person evaluation if the symptoms worsen or if the condition fails to improve as anticipated. Follow up: Return in about 3 months (around 05/16/2020) for follow up Hypertension.  Visit date not found  Meds ordered this encounter  Medications  . docusate sodium (COLACE) 100 MG capsule    Sig: Take 1 capsule (100 mg total) by mouth daily.  . polyethylene glycol (MIRALAX / GLYCOLAX) 17 g packet    Sig: Take 17 g by mouth as needed.  Marland Kitchen FLUoxetine (PROZAC) 20 MG capsule    Sig: Take 1 capsule (20 mg total) by mouth daily.  Dispense:  90 capsule    Refill:  3  . rosuvastatin (CRESTOR) 10 MG tablet    Sig: Take 1 tablet (10 mg total) by mouth daily.    Dispense:  90 tablet    Refill:  3      I reviewed the patients updated PMH, FH, and SocHx.    Patient Active Problem List   Diagnosis Date Noted  . Rheumatoid arthritis involving multiple sites with positive rheumatoid factor ([place]) 10/13/2018    Priority: High  .  Chronic kidney disease (CKD), active medical management without dialysis, stage 3 (moderate) 12/15/2017    Priority: High  . Fibromyalgia 12/15/2017    Priority: High  . Reactive depression (situational) 08/17/2017    Priority: High  . Essential hypertension 12/08/2016    Priority: High  . Peripheral neuropathy 01/12/2014    Priority: High  . Benign hypertensive heart disease without heart failure 06/16/2012    Priority: High  . Dyslipidemia 06/16/2012    Priority: High  . Hx of colonic polyps 12/15/2017    Priority: Medium  . 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 DDD (degenerative disc disease), lumbar 12/15/2017    Priority: Medium  . Obesity, Class III, BMI 40-49.9 (morbid obesity) ([place]) 12/28/2016    Priority: Medium  . History of total right hip replacement 12/15/2017    Priority: Low  . S/P total knee replacement, right 12/15/2017    Priority: Low  . Left foot drop 12/15/2017    Priority: Low  . Status post revision of total knee, left 06/09/2017    Priority: Low  . Chronic constipation 01/12/2020  . Acute blood loss anemia 01/06/2020  . Supracondylar fracture of right femur ([place]) 01/03/2020  . 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 Urine retention 12/02/2016   Current Meds  Medication Sig  . atenolol (TENORMIN) 50 MG tablet Take 1 tablet (50 mg total) by mouth daily.  . Calcium Carbonate-Vitamin D (CALCIUM 600+D) 600-400 MG-UNIT tablet Take 1 tablet by mouth daily.   Marland Kitchen docusate sodium (COLACE) 100 MG capsule Take 1 capsule (100 mg total) by mouth daily.  . ferrous sulfate 325 (65 FE) MG EC tablet Take 1 tablet (325 mg total) by mouth daily with breakfast.  . FLUoxetine (PROZAC) 20 MG capsule Take 1 capsule (20 mg total) by mouth daily.  . folic acid (FOLVITE) 509 MCG tablet Take 800 mcg daily by mouth.   . furosemide (LASIX) 40 MG tablet Take 1 tablet (40 mg total) by mouth daily as needed. As needed for weight gain  >/= 3 pounds. Notify provider if meds given.  . gabapentin (NEURONTIN) 300 MG capsule Take 1 capsule (300 mg total) by mouth 3 (three) times daily.  . Multiple Vitamin (MULTIVITAMIN WITH MINERALS) TABS tablet Take 1 tablet by mouth daily.   . NON FORMULARY Diet: _____ Regular, __x____ NAS, _______Consistent Carbohydrate, _______NPO _____Other  . Omega-3 1000 MG CAPS Take 1 capsule by mouth daily.   . polyethylene glycol (MIRALAX / GLYCOLAX) 17 g packet Take 17 g by mouth as needed.  . rosuvastatin (CRESTOR) 10 MG tablet Take 1 tablet (10 mg total) by mouth daily.  . tamsulosin (FLOMAX) 0.4 MG CAPS capsule Take 1 capsule (0.4 mg total) by mouth daily. Give after same meal each day. Do not open, crush or chew tabs. (Patient taking differently: Take 0.4 mg by mouth as needed. Give after same meal each day. Do not open, crush or chew  tabs.)  . [DISCONTINUED] docusate sodium (COLACE) 100 MG capsule Take 1 capsule (100 mg total) by mouth 2 (two) times daily. (Patient taking differently: Take 100 mg by mouth daily. )  . [DISCONTINUED] FLUoxetine (PROZAC) 20 MG capsule TAKE 1 CAPSULE BY MOUTH EVERY DAY IN THE MORNING  . [DISCONTINUED] polyethylene glycol (MIRALAX / GLYCOLAX) 17 g packet Take 17 g by mouth 2 (two) times daily. (Patient taking differently: Take 17 g by mouth as needed. )  . [DISCONTINUED] rosuvastatin (CRESTOR) 10 MG tablet Take 1 tablet (10 mg total) by mouth daily.    Allergies: Patient is allergic to ativan [lorazepam], haldol [haloperidol], epinephrine, levofloxacin, lipitor [atorvastatin], robaxin [methocarbamol], and trovan [alatrofloxacin]. Family History: Patient family history includes Colon cancer (age of onset: 87) in her sister; Colon polyps in an other family member; Diabetes in her brother, father, and sister; Heart disease in her father; Osteoarthritis in her father and mother; Pneumonia in her mother; Stroke in her father and mother. Social History:  Patient  reports  that she has never smoked. She has never used smokeless tobacco. She reports that she does not drink alcohol and does not use drugs.  Review of Systems: Constitutional: Negative for fever malaise or anorexia Cardiovascular: negative for chest pain Respiratory: negative for SOB or persistent cough Gastrointestinal: negative for abdominal pain  OBJECTIVE Vitals: There were no vitals taken for this visit. General: no acute distress , A&Ox3  Leamon Arnt, MD

## 2020-02-23 ENCOUNTER — Telehealth: Payer: Self-pay

## 2020-02-23 ENCOUNTER — Other Ambulatory Visit: Payer: Self-pay | Admitting: Adult Health

## 2020-02-23 DIAGNOSIS — Z6831 Body mass index (BMI) 31.0-31.9, adult: Secondary | ICD-10-CM | POA: Diagnosis not present

## 2020-02-23 DIAGNOSIS — Z96652 Presence of left artificial knee joint: Secondary | ICD-10-CM | POA: Diagnosis not present

## 2020-02-23 DIAGNOSIS — Z86010 Personal history of colonic polyps: Secondary | ICD-10-CM | POA: Diagnosis not present

## 2020-02-23 DIAGNOSIS — Z7982 Long term (current) use of aspirin: Secondary | ICD-10-CM | POA: Diagnosis not present

## 2020-02-23 DIAGNOSIS — J9811 Atelectasis: Secondary | ICD-10-CM | POA: Diagnosis not present

## 2020-02-23 DIAGNOSIS — Z96641 Presence of right artificial hip joint: Secondary | ICD-10-CM | POA: Diagnosis not present

## 2020-02-23 DIAGNOSIS — S72351D Displaced comminuted fracture of shaft of right femur, subsequent encounter for closed fracture with routine healing: Secondary | ICD-10-CM | POA: Diagnosis not present

## 2020-02-23 DIAGNOSIS — Z9181 History of falling: Secondary | ICD-10-CM | POA: Diagnosis not present

## 2020-02-23 DIAGNOSIS — M069 Rheumatoid arthritis, unspecified: Secondary | ICD-10-CM | POA: Diagnosis not present

## 2020-02-23 DIAGNOSIS — N1330 Unspecified hydronephrosis: Secondary | ICD-10-CM | POA: Diagnosis not present

## 2020-02-23 DIAGNOSIS — E7800 Pure hypercholesterolemia, unspecified: Secondary | ICD-10-CM | POA: Diagnosis not present

## 2020-02-23 DIAGNOSIS — F329 Major depressive disorder, single episode, unspecified: Secondary | ICD-10-CM | POA: Diagnosis not present

## 2020-02-23 DIAGNOSIS — M810 Age-related osteoporosis without current pathological fracture: Secondary | ICD-10-CM | POA: Diagnosis not present

## 2020-02-23 DIAGNOSIS — N189 Chronic kidney disease, unspecified: Secondary | ICD-10-CM | POA: Diagnosis not present

## 2020-02-23 DIAGNOSIS — G629 Polyneuropathy, unspecified: Secondary | ICD-10-CM | POA: Diagnosis not present

## 2020-02-23 DIAGNOSIS — I129 Hypertensive chronic kidney disease with stage 1 through stage 4 chronic kidney disease, or unspecified chronic kidney disease: Secondary | ICD-10-CM | POA: Diagnosis not present

## 2020-02-23 DIAGNOSIS — F419 Anxiety disorder, unspecified: Secondary | ICD-10-CM | POA: Diagnosis not present

## 2020-02-23 DIAGNOSIS — H35033 Hypertensive retinopathy, bilateral: Secondary | ICD-10-CM | POA: Diagnosis not present

## 2020-02-23 DIAGNOSIS — M9711XD 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, subsequent encounter: Secondary | ICD-10-CM | POA: Diagnosis not present

## 2020-02-23 DIAGNOSIS — I10 Essential (primary) hypertension: Secondary | ICD-10-CM

## 2020-02-23 DIAGNOSIS — E669 Obesity, unspecified: Secondary | ICD-10-CM | POA: Diagnosis not present

## 2020-02-23 DIAGNOSIS — K449 Diaphragmatic hernia without obstruction or gangrene: Secondary | ICD-10-CM | POA: Diagnosis not present

## 2020-02-23 DIAGNOSIS — M797 Fibromyalgia: Secondary | ICD-10-CM | POA: Diagnosis not present

## 2020-02-23 DIAGNOSIS — D631 Anemia in chronic kidney disease: Secondary | ICD-10-CM | POA: Diagnosis not present

## 2020-02-23 DIAGNOSIS — Z86018 Personal history of other benign neoplasm: Secondary | ICD-10-CM | POA: Diagnosis not present

## 2020-02-23 DIAGNOSIS — H25013 Cortical age-related cataract, bilateral: Secondary | ICD-10-CM | POA: Diagnosis not present

## 2020-02-23 NOTE — Telephone Encounter (Signed)
Dr. Jonni Sanger is not the original prescribing provider, I will route message to her for approval of med refill. Please advise

## 2020-02-23 NOTE — Telephone Encounter (Signed)
Pt said her atenolol (TENORMIN) 50 MG tablet was [facility] by [facility]. She states she needs it.

## 2020-02-26 ENCOUNTER — Other Ambulatory Visit: Payer: Self-pay | Admitting: Adult Health

## 2020-02-26 DIAGNOSIS — N179 Acute kidney failure, unspecified: Secondary | ICD-10-CM

## 2020-02-26 NOTE — Telephone Encounter (Signed)
Please order #[id number], 3 rf

## 2020-02-27 ENCOUNTER — Other Ambulatory Visit: Payer: Self-pay

## 2020-02-27 DIAGNOSIS — I10 Essential (primary) hypertension: Secondary | ICD-10-CM

## 2020-02-27 MED ORDER — ATENOLOL 50 MG PO TABS: 50.0000 mg | ORAL_TABLET | Freq: Every day | ORAL | 3 refills | Status: DC

## 2020-02-27 NOTE — Telephone Encounter (Signed)
Script sent to pharmacy.

## 2020-02-28 DIAGNOSIS — S72451D Displaced supracondylar fracture without intracondylar extension of lower end of right femur, subsequent encounter for closed fracture with routine healing: Secondary | ICD-10-CM | POA: Diagnosis not present

## 2020-03-02 DIAGNOSIS — M21372 Foot drop, left foot: Secondary | ICD-10-CM | POA: Diagnosis not present

## 2020-03-07 ENCOUNTER — Other Ambulatory Visit: Payer: Self-pay | Admitting: Family Medicine

## 2020-03-07 DIAGNOSIS — I10 Essential (primary) hypertension: Secondary | ICD-10-CM

## 2020-03-08 ENCOUNTER — Telehealth: Payer: Self-pay | Admitting: Family Medicine

## 2020-03-08 NOTE — Telephone Encounter (Signed)
..   LAST APPOINTMENT DATE: 03/07/2020   NEXT APPOINTMENT DATE:@11 /04/2020  MEDICATION:amLODipine (NORASC) 10 MG tablet   [id number] [place], [place] [place] [address]  **Let patient know to contact pharmacy at the end of the day to make sure medication is ready. **  ** Please notify patient to allow 48-72 hours to process**  **Encourage patient to contact the pharmacy for refills or they can request refills through [facility]**  CLINICAL FILLS OUT ALL BELOW:   LAST REFILL:  QTY:  REFILL DATE:    OTHER COMMENTS:    Okay for refill?  Please advise

## 2020-03-08 NOTE — Telephone Encounter (Signed)
Please advise if refill is appropriate 

## 2020-03-09 DIAGNOSIS — D631 Anemia in chronic kidney disease: Secondary | ICD-10-CM | POA: Diagnosis not present

## 2020-03-09 DIAGNOSIS — M797 Fibromyalgia: Secondary | ICD-10-CM | POA: Diagnosis not present

## 2020-03-09 DIAGNOSIS — S72351D Displaced comminuted fracture of shaft of right femur, subsequent encounter for closed fracture with routine healing: Secondary | ICD-10-CM | POA: Diagnosis not present

## 2020-03-09 DIAGNOSIS — Z6831 Body mass index (BMI) 31.0-31.9, adult: Secondary | ICD-10-CM | POA: Diagnosis not present

## 2020-03-09 DIAGNOSIS — Z86018 Personal history of other benign neoplasm: Secondary | ICD-10-CM | POA: Diagnosis not present

## 2020-03-09 DIAGNOSIS — Z96641 Presence of right artificial hip joint: Secondary | ICD-10-CM | POA: Diagnosis not present

## 2020-03-09 DIAGNOSIS — E669 Obesity, unspecified: Secondary | ICD-10-CM | POA: Diagnosis not present

## 2020-03-09 DIAGNOSIS — M069 Rheumatoid arthritis, unspecified: Secondary | ICD-10-CM | POA: Diagnosis not present

## 2020-03-09 DIAGNOSIS — E7800 Pure hypercholesterolemia, unspecified: Secondary | ICD-10-CM | POA: Diagnosis not present

## 2020-03-09 DIAGNOSIS — J9811 Atelectasis: Secondary | ICD-10-CM | POA: Diagnosis not present

## 2020-03-09 DIAGNOSIS — Z86010 Personal history of colonic polyps: Secondary | ICD-10-CM | POA: Diagnosis not present

## 2020-03-09 DIAGNOSIS — I129 Hypertensive chronic kidney disease with stage 1 through stage 4 chronic kidney disease, or unspecified chronic kidney disease: Secondary | ICD-10-CM | POA: Diagnosis not present

## 2020-03-09 DIAGNOSIS — M9711XD 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, subsequent encounter: Secondary | ICD-10-CM | POA: Diagnosis not present

## 2020-03-09 DIAGNOSIS — G629 Polyneuropathy, unspecified: Secondary | ICD-10-CM | POA: Diagnosis not present

## 2020-03-09 DIAGNOSIS — Z7982 Long term (current) use of aspirin: Secondary | ICD-10-CM | POA: Diagnosis not present

## 2020-03-09 DIAGNOSIS — F419 Anxiety disorder, unspecified: Secondary | ICD-10-CM | POA: Diagnosis not present

## 2020-03-09 DIAGNOSIS — M810 Age-related osteoporosis without current pathological fracture: Secondary | ICD-10-CM | POA: Diagnosis not present

## 2020-03-09 DIAGNOSIS — H25013 Cortical age-related cataract, bilateral: Secondary | ICD-10-CM | POA: Diagnosis not present

## 2020-03-09 DIAGNOSIS — K449 Diaphragmatic hernia without obstruction or gangrene: Secondary | ICD-10-CM | POA: Diagnosis not present

## 2020-03-09 DIAGNOSIS — Z9181 History of falling: Secondary | ICD-10-CM | POA: Diagnosis not present

## 2020-03-09 DIAGNOSIS — Z96652 Presence of left artificial knee joint: Secondary | ICD-10-CM | POA: Diagnosis not present

## 2020-03-09 DIAGNOSIS — N189 Chronic kidney disease, unspecified: Secondary | ICD-10-CM | POA: Diagnosis not present

## 2020-03-09 DIAGNOSIS — F329 Major depressive disorder, single episode, unspecified: Secondary | ICD-10-CM | POA: Diagnosis not present

## 2020-03-09 DIAGNOSIS — N1330 Unspecified hydronephrosis: Secondary | ICD-10-CM | POA: Diagnosis not present

## 2020-03-09 DIAGNOSIS — H35033 Hypertensive retinopathy, bilateral: Secondary | ICD-10-CM | POA: Diagnosis not present

## 2020-03-09 NOTE — Telephone Encounter (Signed)
Please call patient.  This is the second request.  From chart review, this medication was stopped after her hospitalization. Can she clarify. Deny refill unless she can explain that it was restarted.   Thanks! Dr. Jonni Sanger

## 2020-03-09 NOTE — Telephone Encounter (Signed)
Per discharge summary, stop the medication and monitor blood pressure.   Her hospital doctors stopped it.

## 2020-03-09 NOTE — Telephone Encounter (Signed)
Patient states she has not stopped this medication.  States she was taking the mediation before she went into the hospital and after she returned home from the nursing home.  States that if this was not given into the nursing home she was not aware.  States all meds were mixed together in a cup and administered to her.    States she was not aware that this medication was stopped.  Patient is requesting more info as to which MD stopped the medication and why.  States she will run out of this med over the weekend.

## 2020-03-09 NOTE — Telephone Encounter (Signed)
Ok for refill? 

## 2020-03-19 ENCOUNTER — Other Ambulatory Visit: Payer: Self-pay | Admitting: Rheumatology

## 2020-03-19 MED ORDER — HYDROXYCHLOROQUINE SULFATE 200 MG PO TABS
200.0000 mg | ORAL_TABLET | Freq: Every day | ORAL | 0 refills | Status: DC
Start: 2020-03-19 — End: 2020-09-11

## 2020-03-19 NOTE — Telephone Encounter (Signed)
Last Visit: 12/21/2019 Next Visit: 05/23/2020 Labs: 01/04/2020 CBC: RBC 3.75, Hgb 10.9, Hct 34.2, BMP: Glucose 141, BUN 32, Creat. 1.45, GFR 34, Calcium 8.6 Eye exam: 12/13/2019 WNL  Current Dose per office note 12/21/2019: PLQ 200 mg 1 tablet by mouth daily  UD:[id number] arthritis   Okay to refill Plaquenil?

## 2020-03-19 NOTE — Telephone Encounter (Signed)
Patient called requesting prescription refill of Hydroxychloroqu[facility] [address] [facility] [place].  Patient states she only has 4 pills remaining.  Patient states while she was in the hospital and rehab center they stopped the medication due to taking a medication to prevent blood clots.  Patient states she is home now and started taking the medication 2 weeks ago.

## 2020-03-20 NOTE — Telephone Encounter (Signed)
Called and lm for pt tcb. 

## 2020-03-23 ENCOUNTER — Other Ambulatory Visit: Payer: Self-pay | Admitting: Family Medicine

## 2020-03-23 NOTE — Telephone Encounter (Signed)
Ok to refill? You are not original prescribing provider

## 2020-03-23 NOTE — Telephone Encounter (Signed)
  LAST APPOINTMENT DATE: 05/11/19  NEXT APPOINTMENT DATE:@11 /04/2020  MEDICATION: gabapentin (NEURONTIN) 300 MG capsule  [id number] [place], [place]

## 2020-03-28 DIAGNOSIS — Z96652 Presence of left artificial knee joint: Secondary | ICD-10-CM | POA: Diagnosis not present

## 2020-03-28 DIAGNOSIS — Z86010 Personal history of colonic polyps: Secondary | ICD-10-CM | POA: Diagnosis not present

## 2020-03-28 DIAGNOSIS — G629 Polyneuropathy, unspecified: Secondary | ICD-10-CM | POA: Diagnosis not present

## 2020-03-28 DIAGNOSIS — E669 Obesity, unspecified: Secondary | ICD-10-CM | POA: Diagnosis not present

## 2020-03-28 DIAGNOSIS — M797 Fibromyalgia: Secondary | ICD-10-CM | POA: Diagnosis not present

## 2020-03-28 DIAGNOSIS — Z96641 Presence of right artificial hip joint: Secondary | ICD-10-CM | POA: Diagnosis not present

## 2020-03-28 DIAGNOSIS — I129 Hypertensive chronic kidney disease with stage 1 through stage 4 chronic kidney disease, or unspecified chronic kidney disease: Secondary | ICD-10-CM | POA: Diagnosis not present

## 2020-03-28 DIAGNOSIS — M9711XD 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, subsequent encounter: Secondary | ICD-10-CM | POA: Diagnosis not present

## 2020-03-28 DIAGNOSIS — N1330 Unspecified hydronephrosis: Secondary | ICD-10-CM | POA: Diagnosis not present

## 2020-03-28 DIAGNOSIS — F419 Anxiety disorder, unspecified: Secondary | ICD-10-CM | POA: Diagnosis not present

## 2020-03-28 DIAGNOSIS — D631 Anemia in chronic kidney disease: Secondary | ICD-10-CM | POA: Diagnosis not present

## 2020-03-28 DIAGNOSIS — F329 Major depressive disorder, single episode, unspecified: Secondary | ICD-10-CM | POA: Diagnosis not present

## 2020-03-28 DIAGNOSIS — Z9181 History of falling: Secondary | ICD-10-CM | POA: Diagnosis not present

## 2020-03-28 DIAGNOSIS — N189 Chronic kidney disease, unspecified: Secondary | ICD-10-CM | POA: Diagnosis not present

## 2020-03-28 DIAGNOSIS — H35033 Hypertensive retinopathy, bilateral: Secondary | ICD-10-CM | POA: Diagnosis not present

## 2020-03-28 DIAGNOSIS — K449 Diaphragmatic hernia without obstruction or gangrene: Secondary | ICD-10-CM | POA: Diagnosis not present

## 2020-03-28 DIAGNOSIS — Z7982 Long term (current) use of aspirin: Secondary | ICD-10-CM | POA: Diagnosis not present

## 2020-03-28 DIAGNOSIS — J9811 Atelectasis: Secondary | ICD-10-CM | POA: Diagnosis not present

## 2020-03-28 DIAGNOSIS — Z86018 Personal history of other benign neoplasm: Secondary | ICD-10-CM | POA: Diagnosis not present

## 2020-03-28 DIAGNOSIS — S72351D Displaced comminuted fracture of shaft of right femur, subsequent encounter for closed fracture with routine healing: Secondary | ICD-10-CM | POA: Diagnosis not present

## 2020-03-28 DIAGNOSIS — E7800 Pure hypercholesterolemia, unspecified: Secondary | ICD-10-CM | POA: Diagnosis not present

## 2020-03-28 DIAGNOSIS — Z6831 Body mass index (BMI) 31.0-31.9, adult: Secondary | ICD-10-CM | POA: Diagnosis not present

## 2020-03-28 DIAGNOSIS — M810 Age-related osteoporosis without current pathological fracture: Secondary | ICD-10-CM | POA: Diagnosis not present

## 2020-03-28 DIAGNOSIS — M069 Rheumatoid arthritis, unspecified: Secondary | ICD-10-CM | POA: Diagnosis not present

## 2020-03-28 DIAGNOSIS — H25013 Cortical age-related cataract, bilateral: Secondary | ICD-10-CM | POA: Diagnosis not present

## 2020-04-07 ENCOUNTER — Other Ambulatory Visit: Payer: Self-pay | Admitting: Adult Health

## 2020-04-10 DIAGNOSIS — S72451D Displaced supracondylar fracture without intracondylar extension of lower end of right femur, subsequent encounter for closed fracture with routine healing: Secondary | ICD-10-CM | POA: Diagnosis not present

## 2020-05-09 DIAGNOSIS — S72451K Displaced supracondylar fracture without intracondylar extension of lower end of right femur, subsequent encounter for closed fracture with nonunion: Secondary | ICD-10-CM | POA: Diagnosis not present

## 2020-05-09 NOTE — Progress Notes (Deleted)
Office Visit Note  Patient: Laura Chambers             Date of Birth: August 12, 1940           MRN: [id number]             PCP: Leamon Arnt, MD Referring: Leamon Arnt, MD Visit Date: 05/23/2020 Occupation: @GUAROCC @  Subjective:  No chief complaint on file.   History of Present Illness: Laura Chambers is a 79 y.o. female ***   Activities of Daily Living:  Patient reports morning stiffness for *** {minute/hour:19697}.   Patient {ACTIONS;DENIES/REPORTS:21021675::"Denies"} nocturnal pain.  Difficulty dressing/grooming: {ACTIONS;DENIES/REPORTS:21021675::"Denies"} Difficulty climbing stairs: {ACTIONS;DENIES/REPORTS:21021675::"Denies"} Difficulty getting out of chair: {ACTIONS;DENIES/REPORTS:21021675::"Denies"} Difficulty using hands for taps, buttons, cutlery, and/or writing: {ACTIONS;DENIES/REPORTS:21021675::"Denies"}  No Rheumatology ROS completed.   PMFS History:  Patient Active Problem List   Diagnosis Date Noted  . Chronic constipation 01/12/2020  . Acute blood loss anemia 01/06/2020  . Supracondylar fracture of right femur ([place]) 01/03/2020  . 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 Rheumatoid arthritis involving multiple sites with positive rheumatoid factor ([place]) 10/13/2018  . Hx of colonic polyps 12/15/2017  . 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 Fibromyalgia 12/15/2017  . History of total right hip replacement 12/15/2017  . DDD (degenerative disc disease), lumbar 12/15/2017  . 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 Left foot drop 12/15/2017  . 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 Status post revision of total knee, left 06/09/2017  . Obesity, Class III, BMI 40-49.9 (morbid obesity) ([place]) 12/28/2016  . Essential hypertension 12/08/2016  . Urine retention 12/02/2016  . Peripheral neuropathy 01/12/2014   . 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 Dyslipidemia 06/16/2012    Past Medical History:  Diagnosis Date  . Anemia   . Anxiety   . Arthritis   . Atelectasis, left    left lower lung  . Blood transfusion without reported diagnosis   . Cataract cortical, senile, bilateral 03/19/2018  . Complication of anesthesia 2012   "irritated trachea" with cough x 1 year from intubation 2012.  smaller tube used with other surgeries  . Depression    just lost her son a month ago  . Esophageal hernia   . Fibromyalgia   . Headache    occasionally  . HTN (hypertension)   . Hx of colonic polyps   . Hydronephrosis of right kidney   . Hypercholesterolemia   . Hypertensive retinopathy of both eyes 03/19/2018  . Neuropathy due to medical condition ([place])    bilateral lower legs  . Obesity   . 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 Osteoporosis   . Renal angiomyolipoma   . Rheumatoid arthritis ([place])     Family History  Problem Relation Age of Onset  . Diabetes Father   . Osteoarthritis Father   . Heart disease Father   . Stroke Father   . Osteoarthritis Mother   . Stroke Mother   . Pneumonia Mother   . Colon cancer Sister 61  . Colon polyps Other        neice/nephew  . Diabetes Sister   . Diabetes Brother    Past Surgical History:  Procedure Laterality Date  . ABDOMINAL HYSTERECTOMY  1969  . BACK SURGERY  2012   rod in back-  lumbar disc removal with bone graft  .  blephoroplasty     bilateral  . BREAST BIOPSY  1989  . CHOLECYSTECTOMY  2010  . EXCISIONAL TOTAL KNEE ARTHROPLASTY WITH ANTIBIOTIC SPACERS Left 11/14/2016   Procedure: EXCISIONAL LEFT TOTAL KNEE ARTHROPLASTY WITH  PLACEMENT OF ANTIBIOTIC SPACERS;  Surgeon: [name], MD;  Location: [facility];  Service: Orthopedics;  Laterality: Left;  . JOINT REPLACEMENT    . NISSEN FUNDOPLICATION    . ORIF FEMUR FRACTURE Right 01/04/2020   Procedure: OPEN REDUCTION  INTERNAL FIXATION (ORIF) DISTAL FEMUR FRACTURE;  Surgeon: Shona Needles, MD;  Location: [facility];  Service: Orthopedics;  Laterality: Right;  . PARAESOPHAGEAL HERNIA REPAIR  1989  . REVISION TOTAL KNEE ARTHROPLASTY Left 06/09/2017  . TONSILLECTOMY  1946  . TOTAL HIP ARTHROPLASTY Right 08/31/2012   Procedure: TOTAL HIP ARTHROPLASTY ANTERIOR APPROACH;  Surgeon: Mauri Pole, MD;  Location: [facility];  Service: Orthopedics;  Laterality: Right;  . TOTAL KNEE ARTHROPLASTY  2001   left  . TOTAL KNEE ARTHROPLASTY  2010   right  . TOTAL KNEE REVISION Left 06/09/2017   Procedure: LEFT REVISION KNEE ARTHROPLASTY;  Surgeon: [name], MD;  Location: [facility];  Service: Orthopedics;  Laterality: Left;   Social History   Social History Narrative   Patient lives at home alone.   Caffeine Use: 1 cup daily   Immunization History  Administered Date(s) Administered  . Fluad Quad(high Dose 65+) 04/26/2019  . Influenza, High Dose Seasonal PF 03/27/2018  . Influenza,inj,Quad PF,6+ Mos 03/13/2017  . Influenza-Unspecified 03/26/2018  . PFIZER SARS-COV-2 Vaccination 09/02/2019, 09/30/2019  . Pneumococcal Conjugate-13 02/26/2018  . Pneumococcal Polysaccharide-23 05/17/2019     Objective: Vital Signs: There were no vitals taken for this visit.   Physical Exam   Musculoskeletal Exam: ***  CDAI Exam: CDAI Score: -- Patient Global: --; Provider Global: -- Swollen: --; Tender: -- Joint Exam 05/23/2020   No joint exam has been documented for this visit   There is currently no information documented on the homunculus. Go to the Rheumatology activity and complete the homunculus joint exam.  Investigation: No additional findings.  Imaging: No results found.  Recent Labs: Lab Results  Component Value Date   WBC 7.6 01/17/2020   HGB 8.8 (L) 01/17/2020   PLT 311 01/17/2020   NA 140 01/17/2020   K 4.5 01/17/2020   CL 105 01/17/2020   CO2 24 01/17/2020   GLUCOSE 93 01/17/2020   BUN 35  (H) 01/17/2020   CREATININE 1.36 (H) 01/17/2020   BILITOT 0.8 12/21/2019   ALKPHOS 93 02/26/2018   AST 19 12/21/2019   ALT 12 12/21/2019   PROT 6.8 12/21/2019   ALBUMIN 4.0 02/26/2018   CALCIUM 8.8 (L) 01/17/2020   GFRAA 43 (L) 01/17/2020    Speciality Comments: PLQ Eye Exam: 12/13/2019 WNL @ [facility]    Procedures:  No procedures performed Allergies: Ativan [lorazepam], Haldol [haloperidol], Epinephrine, Levofloxacin, Lipitor [atorvastatin], Robaxin [methocarbamol], and Trovan [alatrofloxacin]   Assessment / Plan:     Visit Diagnoses: No diagnosis found.  Orders: No orders of the defined types were placed in this encounter.  No orders of the defined types were placed in this encounter.   Face-to-face time spent with patient was *** minutes. Greater than 50% of time was spent in counseling and coordination of care.  Follow-Up Instructions: No follow-ups on file.   Earnestine Mealing, CMA  Note - This record has been created using Editor, commissioning.  Chart creation errors have been sought, but  may not always  have been located. Such creation errors do not reflect on  the standard of medical care.

## 2020-05-16 ENCOUNTER — Encounter: Payer: Self-pay | Admitting: Family Medicine

## 2020-05-16 ENCOUNTER — Other Ambulatory Visit: Payer: Self-pay

## 2020-05-16 ENCOUNTER — Ambulatory Visit (INDEPENDENT_AMBULATORY_CARE_PROVIDER_SITE_OTHER): Payer: PPO | Admitting: Family Medicine

## 2020-05-16 VITALS — BP 110/80 | HR 63 | Temp 98.4°F | Ht 67.0 in | Wt 200.8 lb

## 2020-05-16 DIAGNOSIS — Z8781 Personal history of (healed) traumatic fracture: Secondary | ICD-10-CM | POA: Diagnosis not present

## 2020-05-16 DIAGNOSIS — I10 Essential (primary) hypertension: Secondary | ICD-10-CM

## 2020-05-16 DIAGNOSIS — D631 Anemia in chronic kidney disease: Secondary | ICD-10-CM

## 2020-05-16 DIAGNOSIS — E785 Hyperlipidemia, unspecified: Secondary | ICD-10-CM

## 2020-05-16 DIAGNOSIS — F329 Major depressive disorder, single episode, unspecified: Secondary | ICD-10-CM

## 2020-05-16 DIAGNOSIS — M0579 Rheumatoid arthritis with rheumatoid factor of multiple sites without organ or systems involvement: Secondary | ICD-10-CM | POA: Diagnosis not present

## 2020-05-16 DIAGNOSIS — N183 Chronic kidney disease, stage 3 unspecified: Secondary | ICD-10-CM | POA: Diagnosis not present

## 2020-05-16 DIAGNOSIS — G6289 Other specified polyneuropathies: Secondary | ICD-10-CM

## 2020-05-16 DIAGNOSIS — N189 Chronic kidney disease, unspecified: Secondary | ICD-10-CM | POA: Diagnosis not present

## 2020-05-16 NOTE — Progress Notes (Signed)
Subjective  CC:  Chief Complaint  Patient presents with  . Hypertension    patient doesn't take her BP at home   . Leg Injury    patient wants to discuss her Right leg injury     HPI: Laura Chambers is a 79 y.o. female who presents to the office today to address the problems listed above in the chief complaint.  Hypertension f/u: Control is good . Pt reports she is doing well. taking medications as instructed, no medication side effects noted, no TIAs, no chest pain on exertion, no dyspnea on exertion, no swelling of ankles.  Patient takes her blood pressure at home with a wrist cuff and it runs 116/70.  Blood pressures were running low while she was in rehab and Norvasc was stopped.  Denies adverse effects from his BP medications. Compliance with medication is good.   Recovering from femur fracture.  Has follow-up with orthopedics.  Ambulating with a walker.  Not having pain which is a blessing.  She does need a bone density screening given this fracture though wants to defer until next year.  Chronic kidney disease: Denies chest pain, shortness of breath, lower extremity edema.  She does have chronic kidney disease anemia that was complicated by acute blood loss anemia.  This does need follow-up.  She does remain on iron therapy.  Rheumatoid arthritis: Has follow-up with rheumatology next week.  This has been stable fortunately.  Polyneuropathy managed with twice daily gabapentin.  This is my first time meeting her impression.  She lives independently.  She has 2 sons that check on her daily.  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  Happy.  Doing well with Prozac for depression  Assessment  1. Essential hypertension   2. Chronic kidney disease (CKD), active medical management without dialysis, stage 3 (moderate)   3. Dyslipidemia   4. Rheumatoid arthritis involving multiple sites with positive rheumatoid factor ([place])   5. Anemia associated with chronic renal failure     6. H/O fracture of femur   7. Other polyneuropathy   8. Reactive depression (situational)      Plan    Hypertension f/u: BP control is well controlled.  Continue current medications.  Check renal function and electrolytes.  Hyperlipidemia f/u: On statin and tolerated well.  Chronic kidney disease: Check lab work.  Physical exam is okay.  Rheumatoid arthritis to see rheumatology next week.  On Plaquenil.  Has eye exam scheduled.  History of femur fracture: Improved.  Needs bone density near future.  Patient agrees.  Polyneuropathy well managed with gabapentin.  Stressful year but doing well overall.  Continue Prozac Education regarding management of these chronic disease states was given. Management strategies discussed on successive visits include dietary and exercise recommendations, goals of achieving and maintaining IBW, and lifestyle modifications aiming for adequate sleep and minimizing stressors.   Follow up: 6 months for complete physical  Orders Placed This Encounter  Procedures  . CBC with Differential/Platelet  . COMPLETE METABOLIC PANEL WITH GFR  . Lipid panel  . TSH  . PTH, intact (no Ca)  . Iron, TIBC and Ferritin Panel   No orders of the defined types were placed in this encounter.     BP Readings from Last 3 Encounters:  05/16/20 110/80  01/30/20 (!) 162/68  01/18/20 139/72   Wt Readings from Last 3 Encounters:  05/16/20 200 lb 12.8 oz (91.1 kg)  01/30/20 (!) 216 lb (98 kg)  01/18/20 218 lb  12.8 oz (99.2 kg)    Lab Results  Component Value Date   CHOL 140 08/17/2017   CHOL 136 04/15/2016   CHOL 149 08/20/2015   Lab Results  Component Value Date   HDL 39.10 08/17/2017   HDL 42 (L) 04/15/2016   HDL 48 08/20/2015   Lab Results  Component Value Date   LDLCALC 57 04/15/2016   LDLCALC 72 08/20/2015   LDLCALC 81 12/01/2014   Lab Results  Component Value Date   TRIG 212.0 (H) 08/17/2017   TRIG 184 (H) 04/15/2016   TRIG 145 08/20/2015    Lab Results  Component Value Date   CHOLHDL 4 08/17/2017   CHOLHDL 3.2 04/15/2016   CHOLHDL 3.1 08/20/2015   Lab Results  Component Value Date   LDLDIRECT 58.0 08/17/2017   Lab Results  Component Value Date   CREATININE 1.36 (H) 01/17/2020   BUN 35 (H) 01/17/2020   NA 140 01/17/2020   K 4.5 01/17/2020   CL 105 01/17/2020   CO2 24 01/17/2020    The 10-year ASCVD risk score Mikey Bussing DC Jr., et al., 2013) is: 23.2%   Values used to calculate the score:     Age: 87 years     Sex: Female     Is Non-Hispanic African American: No     Diabetic: No     Tobacco smoker: No     Systolic Blood Pressure: 622 mmHg     Is BP treated: Yes     HDL Cholesterol: 39.1 mg/dL     Total Cholesterol: 140 mg/dL  I reviewed the patients updated PMH, FH, and SocHx.    Patient Active Problem List   Diagnosis Date Noted  . Rheumatoid arthritis involving multiple sites with positive rheumatoid factor ([place]) 10/13/2018    Priority: High  . Chronic kidney disease (CKD), active medical management without dialysis, stage 3 (moderate) 12/15/2017    Priority: High  . Fibromyalgia 12/15/2017    Priority: High  . Reactive depression (situational) 08/17/2017    Priority: High  . Essential hypertension 12/08/2016    Priority: High  . Peripheral neuropathy 01/12/2014    Priority: High  . Benign hypertensive heart disease without heart failure 06/16/2012    Priority: High  . Dyslipidemia 06/16/2012    Priority: High  . Hx of colonic polyps 12/15/2017    Priority: Medium  . 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 DDD (degenerative disc disease), lumbar 12/15/2017    Priority: Medium  . Obesity, Class III, BMI 40-49.9 (morbid obesity) ([place]) 12/28/2016    Priority: Medium  . History of total right hip replacement 12/15/2017    Priority: Low  . S/P total knee replacement, right 12/15/2017    Priority: Low  . Left foot drop 12/15/2017    Priority: Low  . Status post revision of total  knee, left 06/09/2017    Priority: Low  . Anemia associated with chronic renal failure 05/16/2020  . Chronic constipation 01/12/2020  . Acute blood loss anemia 01/06/2020  . Supracondylar fracture of right femur ([place]) 01/03/2020  . 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 Urine retention 12/02/2016    Allergies: Ativan [lorazepam], Haldol [haloperidol], Epinephrine, Levofloxacin, Lipitor [atorvastatin], Robaxin [methocarbamol], and Trovan [alatrofloxacin]  Social History: Patient  reports that she has never smoked. She has never used smokeless tobacco. She reports that she does not drink alcohol and does not use drugs.  Current Meds  Medication Sig  . atenolol (  TENORMIN) 50 MG tablet Take 1 tablet (50 mg total) by mouth daily.  . Calcium Carbonate-Vitamin D (CALCIUM 600+D) 600-400 MG-UNIT tablet Take 1 tablet by mouth daily.   . ferrous sulfate 325 (65 FE) MG EC tablet Take 1 tablet (325 mg total) by mouth daily with breakfast.  . FLUoxetine (PROZAC) 20 MG capsule Take 1 capsule (20 mg total) by mouth daily.  . folic acid (FOLVITE) 924 MCG tablet Take 800 mcg daily by mouth.   . furosemide (LASIX) 40 MG tablet Take 1 tablet (40 mg total) by mouth daily as needed. As needed for weight gain >/= 3 pounds. Notify provider if meds given.  . gabapentin (NEURONTIN) 300 MG capsule TAKE 1 CAPSULE BY MOUTH TWICE A DAY  . hydroxychloroquine (PLAQUENIL) 200 MG tablet Take 1 tablet (200 mg total) by mouth daily.  . Multiple Vitamin (MULTIVITAMIN WITH MINERALS) TABS tablet Take 1 tablet by mouth daily.   . Omega-3 1000 MG CAPS Take 1 capsule by mouth daily.   . polyethylene glycol (MIRALAX / GLYCOLAX) 17 g packet Take 17 g by mouth as needed.  . rosuvastatin (CRESTOR) 10 MG tablet Take 1 tablet (10 mg total) by mouth daily.    Review of Systems: Cardiovascular: negative for chest pain, palpitations, leg swelling, orthopnea Respiratory: negative for SOB, wheezing or  persistent cough Gastrointestinal: negative for abdominal pain Genitourinary: negative for dysuria or gross hematuria  Objective  Vitals: BP 110/80   Pulse 63   Temp 98.4 F (36.9 C) (Temporal)   Ht 5\' 7"  (1.702 m)   Wt 200 lb 12.8 oz (91.1 kg)   SpO2 97%   BMI 31.45 kg/m  General: no acute distress  Psych:  Alert and oriented, normal mood and affect HEENT:  Normocephalic, atraumatic, supple neck  Cardiovascular:  RRR without murmur.  Trace  edema Respiratory:  Good breath sounds bilaterally, CTAB with normal respiratory effort Skin:  Warm, no rashes Neurologic:   Mental status is normal  Commons side effects, risks, benefits, and alternatives for medications and treatment plan prescribed today were discussed, and the patient expressed understanding of the given instructions. Patient is instructed to call or message via MyChart if he/she has any questions or concerns regarding our treatment plan. No barriers to understanding were identified. We discussed Red Flag symptoms and signs in detail. Patient expressed understanding regarding what to do in case of urgent or emergency type symptoms.   Medication list was reconciled, printed and provided to the patient in AVS. Patient instructions and summary information was reviewed with the patient as documented in the AVS. This note was prepared with assistance of Dragon voice recognition software. Occasional wrong-word or sound-a-like substitutions may have occurred due to the inherent limitations of voice recognition software  This visit occurred during the SARS-CoV-2 public health emergency.  Safety protocols were in place, including screening questions prior to the visit, additional usage of staff PPE, and extensive cleaning of exam room while observing appropriate contact time as indicated for disinfecting solutions.

## 2020-05-16 NOTE — Patient Instructions (Signed)
Please return in 6 months for complete physical.   I will release your lab results to you on your MyChart account with further instructions. Please reply with any questions.   Your medications do not need refills at this time. I am not making any medication changes today.  I'm so glad you are finally recovering!  It was a pleasure meeting you today! Thank you for choosing [place] to meet your healthcare needs! I truly look forward to working with you. If you have any questions or concerns, please send me a message via Mychart or call the office at [phone].

## 2020-05-17 LAB — CBC WITH DIFFERENTIAL/PLATELET
Absolute Monocytes: 757 cells/uL (ref 200–950)
Basophils Absolute: 60 cells/uL (ref 0–200)
Basophils Relative: 0.7 %
Eosinophils Absolute: 456 cells/uL (ref 15–500)
Eosinophils Relative: 5.3 %
HCT: 41.9 % (ref 35.0–45.0)
Hemoglobin: 13.9 g/dL (ref 11.7–15.5)
Lymphs Abs: 2150 cells/uL (ref 850–3900)
MCH: 29.3 pg (ref 27.0–33.0)
MCHC: 33.2 g/dL (ref 32.0–36.0)
MCV: 88.4 fL (ref 80.0–100.0)
MPV: 12 fL (ref 7.5–12.5)
Monocytes Relative: 8.8 %
Neutro Abs: 5177 cells/uL (ref 1500–7800)
Neutrophils Relative %: 60.2 %
Platelets: 246 10*3/uL (ref 140–400)
RBC: 4.74 10*6/uL (ref 3.80–5.10)
RDW: 12.3 % (ref 11.0–15.0)
Total Lymphocyte: 25 %
WBC: 8.6 10*3/uL (ref 3.8–10.8)

## 2020-05-17 LAB — COMPLETE METABOLIC PANEL WITH GFR
AG Ratio: 1.8 (calc) (ref 1.0–2.5)
ALT: 16 U/L (ref 6–29)
AST: 26 U/L (ref 10–35)
Albumin: 4.2 g/dL (ref 3.6–5.1)
Alkaline phosphatase (APISO): 99 U/L (ref 37–153)
BUN/Creatinine Ratio: 19 (calc) (ref 6–22)
BUN: 27 mg/dL — ABNORMAL HIGH (ref 7–25)
CO2: 31 mmol/L (ref 20–32)
Calcium: 9.6 mg/dL (ref 8.6–10.4)
Chloride: 104 mmol/L (ref 98–110)
Creat: 1.41 mg/dL — ABNORMAL HIGH (ref 0.60–0.93)
GFR, Est African American: 41 mL/min/{1.73_m2} — ABNORMAL LOW (ref >=60)
GFR, Est Non African American: 35 mL/min/{1.73_m2} — ABNORMAL LOW (ref >=60)
Globulin: 2.4 g/dL (calc) (ref 1.9–3.7)
Glucose, Bld: 94 mg/dL (ref 65–99)
Potassium: 5.1 mmol/L (ref 3.5–5.3)
Sodium: 140 mmol/L (ref 135–146)
Total Bilirubin: 1.1 mg/dL (ref 0.2–1.2)
Total Protein: 6.6 g/dL (ref 6.1–8.1)

## 2020-05-17 LAB — LIPID PANEL
Cholesterol: 141 mg/dL (ref <200)
HDL: 45 mg/dL — ABNORMAL LOW (ref >=50)
LDL Cholesterol (Calc): 70 mg/dL (calc)
Non-HDL Cholesterol (Calc): 96 mg/dL (calc) (ref <130)
Total CHOL/HDL Ratio: 3.1 (calc) (ref <5.0)
Triglycerides: 190 mg/dL — ABNORMAL HIGH (ref <150)

## 2020-05-17 LAB — PARATHYROID HORMONE, INTACT (NO CA): PTH: 39 pg/mL (ref 14–64)

## 2020-05-17 LAB — IRON,TIBC AND FERRITIN PANEL
%SAT: 21 % (calc) (ref 16–45)
Ferritin: 263 ng/mL (ref 16–288)
Iron: 61 ug/dL (ref 45–160)
TIBC: 285 mcg/dL (calc) (ref 250–450)

## 2020-05-17 LAB — TSH: TSH: 1.66 mIU/L (ref 0.40–4.50)

## 2020-05-17 NOTE — Progress Notes (Signed)
Please call patient: I have reviewed his/her lab results. Lab work looks good!  Anemia has resolved. Kidney function remains abnormal but is stable. Iron levels have normalized.  No change in medications are needed at this time.  Follow up as directed.

## 2020-05-21 DIAGNOSIS — M069 Rheumatoid arthritis, unspecified: Secondary | ICD-10-CM | POA: Diagnosis not present

## 2020-05-21 DIAGNOSIS — H25013 Cortical age-related cataract, bilateral: Secondary | ICD-10-CM | POA: Diagnosis not present

## 2020-05-21 DIAGNOSIS — H2512 Age-related nuclear cataract, left eye: Secondary | ICD-10-CM | POA: Diagnosis not present

## 2020-05-21 DIAGNOSIS — H25043 Posterior subcapsular polar age-related cataract, bilateral: Secondary | ICD-10-CM | POA: Diagnosis not present

## 2020-05-21 DIAGNOSIS — Z79899 Other long term (current) drug therapy: Secondary | ICD-10-CM | POA: Diagnosis not present

## 2020-05-21 DIAGNOSIS — H524 Presbyopia: Secondary | ICD-10-CM | POA: Diagnosis not present

## 2020-05-21 DIAGNOSIS — H2513 Age-related nuclear cataract, bilateral: Secondary | ICD-10-CM | POA: Diagnosis not present

## 2020-05-21 LAB — HM DIABETES EYE EXAM

## 2020-05-22 DIAGNOSIS — S72451D Displaced supracondylar fracture without intracondylar extension of lower end of right femur, subsequent encounter for closed fracture with routine healing: Secondary | ICD-10-CM | POA: Diagnosis not present

## 2020-05-23 ENCOUNTER — Ambulatory Visit: Payer: PPO | Admitting: Rheumatology

## 2020-05-23 DIAGNOSIS — Z79899 Other long term (current) drug therapy: Secondary | ICD-10-CM

## 2020-05-23 DIAGNOSIS — N183 Chronic kidney disease, stage 3 unspecified: Secondary | ICD-10-CM

## 2020-05-23 DIAGNOSIS — Z8669 Personal history of other diseases of the nervous system and sense organs: Secondary | ICD-10-CM

## 2020-05-23 DIAGNOSIS — M21372 Foot drop, left foot: Secondary | ICD-10-CM

## 2020-05-23 DIAGNOSIS — I10 Essential (primary) hypertension: Secondary | ICD-10-CM

## 2020-05-23 DIAGNOSIS — Z96641 Presence of right artificial hip joint: Secondary | ICD-10-CM

## 2020-05-23 DIAGNOSIS — E785 Hyperlipidemia, unspecified: Secondary | ICD-10-CM

## 2020-05-23 DIAGNOSIS — M797 Fibromyalgia: Secondary | ICD-10-CM

## 2020-05-23 DIAGNOSIS — D1771 Benign lipomatous neoplasm of kidney: Secondary | ICD-10-CM

## 2020-05-23 DIAGNOSIS — Z96651 Presence of right artificial knee joint: Secondary | ICD-10-CM

## 2020-05-23 DIAGNOSIS — Z96659 Presence of unspecified artificial knee joint: Secondary | ICD-10-CM

## 2020-05-23 DIAGNOSIS — Z86010 Personal history of colonic polyps: Secondary | ICD-10-CM

## 2020-05-23 DIAGNOSIS — Z96652 Presence of left artificial knee joint: Secondary | ICD-10-CM

## 2020-05-23 DIAGNOSIS — M0579 Rheumatoid arthritis with rheumatoid factor of multiple sites without organ or systems involvement: Secondary | ICD-10-CM

## 2020-05-23 DIAGNOSIS — M5136 Other intervertebral disc degeneration, lumbar region: Secondary | ICD-10-CM

## 2020-05-23 DIAGNOSIS — Z8659 Personal history of other mental and behavioral disorders: Secondary | ICD-10-CM

## 2020-05-23 DIAGNOSIS — Z13820 Encounter for screening for osteoporosis: Secondary | ICD-10-CM

## 2020-05-24 NOTE — Progress Notes (Deleted)
Office Visit Note  Patient: Laura Chambers             Date of Birth: 23-Sep-1940           MRN: [id number]             PCP: Leamon Arnt, MD Referring: Leamon Arnt, MD Visit Date: 06/06/2020 Occupation: @GUAROCC @  Subjective:  No chief complaint on file.   History of Present Illness: Laura Chambers is a 79 y.o. female ***   Activities of Daily Living:  Patient reports morning stiffness for *** {minute/hour:19697}.   Patient {ACTIONS;DENIES/REPORTS:21021675::"Denies"} nocturnal pain.  Difficulty dressing/grooming: {ACTIONS;DENIES/REPORTS:21021675::"Denies"} Difficulty climbing stairs: {ACTIONS;DENIES/REPORTS:21021675::"Denies"} Difficulty getting out of chair: {ACTIONS;DENIES/REPORTS:21021675::"Denies"} Difficulty using hands for taps, buttons, cutlery, and/or writing: {ACTIONS;DENIES/REPORTS:21021675::"Denies"}  No Rheumatology ROS completed.   PMFS History:  Patient Active Problem List   Diagnosis Date Noted  . Anemia associated with chronic renal failure 05/16/2020  . Chronic constipation 01/12/2020  . Acute blood loss anemia 01/06/2020  . Supracondylar fracture of right femur ([place]) 01/03/2020  . 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 Rheumatoid arthritis involving multiple sites with positive rheumatoid factor ([place]) 10/13/2018  . Hx of colonic polyps 12/15/2017  . 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 Fibromyalgia 12/15/2017  . History of total right hip replacement 12/15/2017  . DDD (degenerative disc disease), lumbar 12/15/2017  . 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 Left foot drop 12/15/2017  . 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 Status post revision of total knee, left 06/09/2017  . Obesity, Class III, BMI 40-49.9 (morbid obesity) ([place]) 12/28/2016  . Essential hypertension 12/08/2016  .  Urine retention 12/02/2016  . Peripheral neuropathy 01/12/2014  . 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 Dyslipidemia 06/16/2012    Past Medical History:  Diagnosis Date  . Anemia   . Anxiety   . Arthritis   . Atelectasis, left    left lower lung  . Blood transfusion without reported diagnosis   . Cataract cortical, senile, bilateral 03/19/2018  . Complication of anesthesia 2012   "irritated trachea" with cough x 1 year from intubation 2012.  smaller tube used with other surgeries  . Depression    just lost her son a month ago  . Esophageal hernia   . Fibromyalgia   . Headache    occasionally  . HTN (hypertension)   . Hx of colonic polyps   . Hydronephrosis of right kidney   . Hypercholesterolemia   . Hypertensive retinopathy of both eyes 03/19/2018  . Neuropathy due to medical condition ([place])    bilateral lower legs  . Obesity   . 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 Osteoporosis   . Renal angiomyolipoma   . Rheumatoid arthritis ([place])     Family History  Problem Relation Age of Onset  . Diabetes Father   . Osteoarthritis Father   . Heart disease Father   . Stroke Father   . Osteoarthritis Mother   . Stroke Mother   . Pneumonia Mother   . Colon cancer Sister 39  . Colon polyps Other        neice/nephew  . Diabetes Sister   . Diabetes Brother    Past Surgical History:  Procedure Laterality Date  . ABDOMINAL HYSTERECTOMY  1969  . BACK SURGERY  2012   rod in  back-  lumbar disc removal with bone graft  . blephoroplasty     bilateral  . BREAST BIOPSY  1989  . CHOLECYSTECTOMY  2010  . EXCISIONAL TOTAL KNEE ARTHROPLASTY WITH ANTIBIOTIC SPACERS Left 11/14/2016   Procedure: EXCISIONAL LEFT TOTAL KNEE ARTHROPLASTY WITH  PLACEMENT OF ANTIBIOTIC SPACERS;  Surgeon: [name], MD;  Location: [facility];  Service: Orthopedics;  Laterality: Left;  . JOINT REPLACEMENT    . NISSEN FUNDOPLICATION    . ORIF  FEMUR FRACTURE Right 01/04/2020   Procedure: OPEN REDUCTION INTERNAL FIXATION (ORIF) DISTAL FEMUR FRACTURE;  Surgeon: Shona Needles, MD;  Location: [facility];  Service: Orthopedics;  Laterality: Right;  . PARAESOPHAGEAL HERNIA REPAIR  1989  . REVISION TOTAL KNEE ARTHROPLASTY Left 06/09/2017  . TONSILLECTOMY  1946  . TOTAL HIP ARTHROPLASTY Right 08/31/2012   Procedure: TOTAL HIP ARTHROPLASTY ANTERIOR APPROACH;  Surgeon: Mauri Pole, MD;  Location: [facility];  Service: Orthopedics;  Laterality: Right;  . TOTAL KNEE ARTHROPLASTY  2001   left  . TOTAL KNEE ARTHROPLASTY  2010   right  . TOTAL KNEE REVISION Left 06/09/2017   Procedure: LEFT REVISION KNEE ARTHROPLASTY;  Surgeon: [name], MD;  Location: [facility];  Service: Orthopedics;  Laterality: Left;   Social History   Social History Narrative   Patient lives at home alone.   Caffeine Use: 1 cup daily   Immunization History  Administered Date(s) Administered  . Fluad Quad(high Dose 65+) 04/26/2019  . Influenza, High Dose Seasonal PF 03/27/2018  . Influenza,inj,Quad PF,6+ Mos 03/13/2017  . Influenza-Unspecified 03/26/2018  . PFIZER SARS-COV-2 Vaccination 09/02/2019, 09/30/2019  . Pneumococcal Conjugate-13 02/26/2018  . Pneumococcal Polysaccharide-23 05/17/2019     Objective: Vital Signs: There were no vitals taken for this visit.   Physical Exam   Musculoskeletal Exam: ***  CDAI Exam: CDAI Score: - Patient Global: -; Provider Global: - Swollen: -; Tender: - Joint Exam 06/06/2020   No joint exam has been documented for this visit   There is currently no information documented on the homunculus. Go to the Rheumatology activity and complete the homunculus joint exam.  Investigation: No additional findings.  Imaging: No results found.  Recent Labs: Lab Results  Component Value Date   WBC 8.6 05/16/2020   HGB 13.9 05/16/2020   PLT 246 05/16/2020   NA 140 05/16/2020   K 5.1 05/16/2020   CL 104 05/16/2020    CO2 31 05/16/2020   GLUCOSE 94 05/16/2020   BUN 27 (H) 05/16/2020   CREATININE 1.41 (H) 05/16/2020   BILITOT 1.1 05/16/2020   ALKPHOS 93 02/26/2018   AST 26 05/16/2020   ALT 16 05/16/2020   PROT 6.6 05/16/2020   ALBUMIN 4.0 02/26/2018   CALCIUM 9.6 05/16/2020   GFRAA 41 (L) 05/16/2020    Speciality Comments: PLQ Eye Exam: 05/21/2020 WNL @ [facility]    Procedures:  No procedures performed Allergies: Ativan [lorazepam], Haldol [haloperidol], Epinephrine, Levofloxacin, Lipitor [atorvastatin], Robaxin [methocarbamol], and Trovan [alatrofloxacin]   Assessment / Plan:     Visit Diagnoses: No diagnosis found.  Orders: No orders of the defined types were placed in this encounter.  No orders of the defined types were placed in this encounter.   Face-to-face time spent with patient was *** minutes. Greater than 50% of time was spent in counseling and coordination of care.  Follow-Up Instructions: No follow-ups on file.   Earnestine Mealing, CMA  Note - This record has been created using Editor, commissioning.  Chart creation errors have been sought, but may not always  have been located. Such creation errors do not reflect on  the standard of medical care.

## 2020-05-30 DIAGNOSIS — H2512 Age-related nuclear cataract, left eye: Secondary | ICD-10-CM | POA: Diagnosis not present

## 2020-05-30 DIAGNOSIS — H25812 Combined forms of age-related cataract, left eye: Secondary | ICD-10-CM | POA: Diagnosis not present

## 2020-06-04 DIAGNOSIS — H2511 Age-related nuclear cataract, right eye: Secondary | ICD-10-CM | POA: Diagnosis not present

## 2020-06-04 DIAGNOSIS — H25041 Posterior subcapsular polar age-related cataract, right eye: Secondary | ICD-10-CM | POA: Diagnosis not present

## 2020-06-04 DIAGNOSIS — H25011 Cortical age-related cataract, right eye: Secondary | ICD-10-CM | POA: Diagnosis not present

## 2020-06-06 ENCOUNTER — Ambulatory Visit: Payer: PPO | Admitting: Rheumatology

## 2020-06-06 DIAGNOSIS — Z86010 Personal history of colonic polyps: Secondary | ICD-10-CM

## 2020-06-06 DIAGNOSIS — Z96659 Presence of unspecified artificial knee joint: Secondary | ICD-10-CM

## 2020-06-06 DIAGNOSIS — Z8659 Personal history of other mental and behavioral disorders: Secondary | ICD-10-CM

## 2020-06-06 DIAGNOSIS — Z96651 Presence of right artificial knee joint: Secondary | ICD-10-CM

## 2020-06-06 DIAGNOSIS — M5136 Other intervertebral disc degeneration, lumbar region: Secondary | ICD-10-CM

## 2020-06-06 DIAGNOSIS — I10 Essential (primary) hypertension: Secondary | ICD-10-CM

## 2020-06-06 DIAGNOSIS — Z96641 Presence of right artificial hip joint: Secondary | ICD-10-CM

## 2020-06-06 DIAGNOSIS — Z96652 Presence of left artificial knee joint: Secondary | ICD-10-CM

## 2020-06-06 DIAGNOSIS — Z8669 Personal history of other diseases of the nervous system and sense organs: Secondary | ICD-10-CM

## 2020-06-06 DIAGNOSIS — M21372 Foot drop, left foot: Secondary | ICD-10-CM

## 2020-06-06 DIAGNOSIS — N183 Chronic kidney disease, stage 3 unspecified: Secondary | ICD-10-CM

## 2020-06-06 DIAGNOSIS — D1771 Benign lipomatous neoplasm of kidney: Secondary | ICD-10-CM

## 2020-06-06 DIAGNOSIS — Z79899 Other long term (current) drug therapy: Secondary | ICD-10-CM

## 2020-06-06 DIAGNOSIS — Z13820 Encounter for screening for osteoporosis: Secondary | ICD-10-CM

## 2020-06-06 DIAGNOSIS — M0579 Rheumatoid arthritis with rheumatoid factor of multiple sites without organ or systems involvement: Secondary | ICD-10-CM

## 2020-06-06 DIAGNOSIS — E785 Hyperlipidemia, unspecified: Secondary | ICD-10-CM

## 2020-06-06 DIAGNOSIS — M797 Fibromyalgia: Secondary | ICD-10-CM

## 2020-06-07 ENCOUNTER — Telehealth: Payer: Self-pay

## 2020-06-07 NOTE — Telephone Encounter (Signed)
Patient received flu shot 11/2 at [facility]

## 2020-06-08 NOTE — Telephone Encounter (Signed)
Patient's chart UTD

## 2020-06-13 DIAGNOSIS — H25011 Cortical age-related cataract, right eye: Secondary | ICD-10-CM | POA: Diagnosis not present

## 2020-06-13 DIAGNOSIS — H25041 Posterior subcapsular polar age-related cataract, right eye: Secondary | ICD-10-CM | POA: Diagnosis not present

## 2020-06-13 DIAGNOSIS — H25811 Combined forms of age-related cataract, right eye: Secondary | ICD-10-CM | POA: Diagnosis not present

## 2020-06-13 DIAGNOSIS — H2511 Age-related nuclear cataract, right eye: Secondary | ICD-10-CM | POA: Diagnosis not present

## 2020-06-19 ENCOUNTER — Telehealth: Payer: PPO | Admitting: Rheumatology

## 2020-06-19 ENCOUNTER — Other Ambulatory Visit: Payer: Self-pay

## 2020-06-19 NOTE — Progress Notes (Deleted)
Virtual Visit via Telephone Note  I connected with Laura Chambers on 06/19/20 at  4:00 PM EST by telephone and verified that I am speaking with the correct person using two identifiers.  Location: Patient: Home  Provider: Clinic This service was conducted via virtual visit.  The patient was located at home. I was located in my office.  Consent was obtained prior to the virtual visit and is aware of possible charges through their insurance for this visit.  The patient is an established patient.  Dr. Estanislado Pandy, MD conducted the virtual visit and Hazel Sams, PA-C acted as scribe during the service.  Office staff helped with scheduling follow up visits after the service was conducted.     I discussed the limitations, risks, security and privacy concerns of performing an evaluation and management service by telephone and the availability of in person appointments. I also discussed with the patient that there may be a patient responsible charge related to this service. The patient expressed understanding and agreed to proceed.  CC: History of Present Illness:  Review of Systems  Constitutional: Negative for fever and malaise/fatigue.  Eyes: Negative for photophobia, pain, discharge and redness.  Respiratory: Negative for cough, shortness of breath and wheezing.   Cardiovascular: Negative for chest pain and palpitations.  Gastrointestinal: Negative for blood in stool, constipation and diarrhea.  Genitourinary: Negative for dysuria.  Musculoskeletal: Negative for back pain, joint pain, myalgias and neck pain.  Skin: Negative for rash.  Neurological: Negative for dizziness and headaches.  Psychiatric/Behavioral: Negative for depression. The patient is not nervous/anxious and does not have insomnia.       Observations/Objective: Physical Exam Neurological:     Mental Status: She is alert and oriented to person, place, and time.  Psychiatric:        Mood and Affect: Mood and affect normal.         Cognition and Memory: Memory normal.        Judgment: Judgment normal.    Patient reports morning stiffness for *** {minute/hour:19697}.   Patient {Actions; denies-reports:120008} nocturnal pain.  Difficulty dressing/grooming: {ACTIONS;DENIES/REPORTS:21021675::"Denies"} Difficulty climbing stairs: {ACTIONS;DENIES/REPORTS:21021675::"Denies"} Difficulty getting out of chair: {ACTIONS;DENIES/REPORTS:21021675::"Denies"} Difficulty using hands for taps, buttons, cutlery, and/or writing: {ACTIONS;DENIES/REPORTS:21021675::"Denies"}   Assessment and Plan:   Follow Up Instructions:    I discussed the assessment and treatment plan with the patient. The patient was provided an opportunity to ask questions and all were answered. The patient agreed with the plan and demonstrated an understanding of the instructions.   The patient was advised to call back or seek an in-person evaluation if the symptoms worsen or if the condition fails to improve as anticipated.  I provided *** minutes of non-face-to-face time during this encounter.   Ofilia Neas, PA-C

## 2020-06-21 ENCOUNTER — Other Ambulatory Visit: Payer: Self-pay

## 2020-06-21 ENCOUNTER — Encounter: Payer: Self-pay | Admitting: Rheumatology

## 2020-06-21 ENCOUNTER — Telehealth (INDEPENDENT_AMBULATORY_CARE_PROVIDER_SITE_OTHER): Payer: PPO | Admitting: Rheumatology

## 2020-06-21 VITALS — Ht 67.0 in

## 2020-06-21 DIAGNOSIS — Z96652 Presence of left artificial knee joint: Secondary | ICD-10-CM

## 2020-06-21 DIAGNOSIS — M0579 Rheumatoid arthritis with rheumatoid factor of multiple sites without organ or systems involvement: Secondary | ICD-10-CM | POA: Diagnosis not present

## 2020-06-21 DIAGNOSIS — T8459XS Infection and inflammatory reaction due to other internal joint prosthesis, sequela: Secondary | ICD-10-CM

## 2020-06-21 DIAGNOSIS — Z860100 Personal history of colon polyps, unspecified: Secondary | ICD-10-CM

## 2020-06-21 DIAGNOSIS — Z79899 Other long term (current) drug therapy: Secondary | ICD-10-CM

## 2020-06-21 DIAGNOSIS — Z96641 Presence of right artificial hip joint: Secondary | ICD-10-CM | POA: Diagnosis not present

## 2020-06-21 DIAGNOSIS — I10 Essential (primary) hypertension: Secondary | ICD-10-CM

## 2020-06-21 DIAGNOSIS — D1771 Benign lipomatous neoplasm of kidney: Secondary | ICD-10-CM

## 2020-06-21 DIAGNOSIS — M51369 Other intervertebral disc degeneration, lumbar region without mention of lumbar back pain or lower extremity pain: Secondary | ICD-10-CM

## 2020-06-21 DIAGNOSIS — N183 Chronic kidney disease, stage 3 unspecified: Secondary | ICD-10-CM | POA: Diagnosis not present

## 2020-06-21 DIAGNOSIS — Z96651 Presence of right artificial knee joint: Secondary | ICD-10-CM

## 2020-06-21 DIAGNOSIS — S72451D Displaced supracondylar fracture without intracondylar extension of lower end of right femur, subsequent encounter for closed fracture with routine healing: Secondary | ICD-10-CM

## 2020-06-21 DIAGNOSIS — Z96659 Presence of unspecified artificial knee joint: Secondary | ICD-10-CM

## 2020-06-21 DIAGNOSIS — M21372 Foot drop, left foot: Secondary | ICD-10-CM | POA: Diagnosis not present

## 2020-06-21 DIAGNOSIS — E785 Hyperlipidemia, unspecified: Secondary | ICD-10-CM | POA: Diagnosis not present

## 2020-06-21 DIAGNOSIS — M5136 Other intervertebral disc degeneration, lumbar region: Secondary | ICD-10-CM | POA: Diagnosis not present

## 2020-06-21 DIAGNOSIS — Z8659 Personal history of other mental and behavioral disorders: Secondary | ICD-10-CM

## 2020-06-21 DIAGNOSIS — M797 Fibromyalgia: Secondary | ICD-10-CM

## 2020-06-21 DIAGNOSIS — Z8669 Personal history of other diseases of the nervous system and sense organs: Secondary | ICD-10-CM

## 2020-06-21 NOTE — Progress Notes (Signed)
Virtual Visit via Telephone Note  I connected with Laura Chambers on 06/21/20 at  4:00 PM EST by telephone and verified that I am speaking with the correct person using two identifiers.  Location: Patient: Home  Provider: Clinic   This service was conducted via virtual visit.  The patient was located at home. I was located in my office.  Consent was obtained prior to the virtual visit and is aware of possible charges through their insurance for this visit.  The patient is an established patient.  Dr. Estanislado Pandy, MD conducted the virtual visit and Hazel Sams, PA-C acted as scribe during the service.  Office staff helped with scheduling follow up visits after the service was conducted.   I discussed the limitations, risks, security and privacy concerns of performing an evaluation and management service by telephone and the availability of in person appointments. I also discussed with the patient that there may be a patient responsible charge related to this service. The patient expressed understanding and agreed to proceed.  CC: Medication monitoring  History of Present Illness: Patient is a 79 year old female with a past medical history of seropositive rheumatoid arthritis and DDD. She denies any recent rheumatoid arthritis flares.  She is clinically doing well taking plaquenil 200 mg 1 tablet by mouth daily. She is tolerating PLQ without any side effects. She denies any pain or joint swelling in her hands or feet at this time.  She continues to take natural antiinflammatories including ginger, omega 3, and tart cherry. She rates her RA joint pain and swelling a 4/10.  She rates in June she fell and fractured her right femur.  She was hospitalized for 8 days and in a SNF for about 3 weeks.  She continues to have ongoing discomfort and difficulty ambulating. She continues to follow up with Dr. Doreatha Martin. She has occasional discomfort in the right knee and right hip replacement.  She denies any recent  infections.  She has received the annual influenza vaccine and the covid-19 vaccine booster. She underwent cataract surgery recently and is seeing her ophthalmologist on a weekly basis.     Review of Systems  Constitutional: Negative for fever and malaise/fatigue.  Eyes: Negative for photophobia, pain, discharge and redness.       +Eye dryness  Respiratory: Negative for cough, shortness of breath and wheezing.   Cardiovascular: Negative for chest pain and palpitations.  Gastrointestinal: Negative for blood in stool, constipation and diarrhea.  Genitourinary: Negative for dysuria.  Musculoskeletal: Positive for joint pain. Negative for back pain, myalgias and neck pain.  Skin: Negative for rash.  Neurological: Negative for dizziness and headaches.  Psychiatric/Behavioral: Negative for depression. The patient is not nervous/anxious and does not have insomnia.        Observations/Objective: Physical Exam Neurological:     Mental Status: She is alert and oriented to person, place, and time.  Psychiatric:        Mood and Affect: Mood and affect normal.        Cognition and Memory: Memory normal.        Judgment: Judgment normal.    Patient reports morning stiffness for 10-15  minutes.   Patient denies nocturnal pain.  Difficulty dressing/grooming: Denies Difficulty climbing stairs: Reports Difficulty getting out of chair: Reports Difficulty using hands for taps, buttons, cutlery, and/or writing: Denies   Assessment and Plan: Visit Diagnoses: Rheumatoid arthritis involving multiple sites with positive rheumatoid factor (HCC) -Severe rheumatoid arthritis. She has severe intercarpal radiocarpal  joint space narrowing and subtalar joint narrowing on x-rays updated on 12/21/19.  She has not had any recent rheumatoid arthritis flares. She is clinically doing well on plaquenil 200 mg 1 tablet by mouth daily.  She is tolerating PLQ without any side effects. She declined more aggressive  treatment options in the past. She is not experiencing any discomfort or inflammation in her hands or feet at this time.  She will continue taking plaquenil as prescribed. She was advised to notify us if she develops increased joint pain or joint swelling.  She will follow up in 4-5 months.   High risk medication use: She is taking plaquenil 200 mg 1 tablet by mouth daily. CBC and CMP updated on 05/16/20.  She will require updated lab work every 5 months to monitor for drug toxicity.  PLQ Eye Exam: 05/21/2020 WNL @ [facility].   Displaced supracondylar fracture without intracondylar extension of lower end of right femur, subsequent encounter for closed fracture with routine healing: She fell in June 2021 and fractured her right femur.  She was hospitalized and transferred to a SNF for 3 weeks.  She continues to follow up with Dr. Doreatha Martin.   History of total right hip replacement- Chronic pain.  Followed by Dr. Doreatha Martin after right femur fracture in June 2021.   S/P total knee replacement, right-She fell in June 2021 and fractured her right femur. Followed by Dr. Doreatha Martin  Status post revision of total knee, left-Chronic pain  Infected prosthetic knee joint, sequela  DDD (degenerative disc disease), lumbar-Chronic pain   Left foot drop: She has some gait instability secondary to left foot drop.  She attributes her fall in June due to the foot drop.   Osteoporosis screening-DEXA ordered by PCP several years ago according to the patient.  She has not had an updated DEXA since her right femur fracture in June.  We strongly encouraged her to schedule updated DEXA.  She declined for [place] to place the order today but she plans to discuss further with PCP.   Other medical conditions are listed as follows:   Chronic kidney disease (CKD), active medical management without dialysis, stage 3 (moderate)  Renal angiomyolipoma  Essential hypertension  Dyslipidemia  Fibromyalgia  Hx of  colonic polyps  Hx of peripheral neuropathy  History of anxiety  History of depression     Follow Up Instructions: She will follow up in 4-5 months.    I discussed the assessment and treatment plan with the patient. The patient was provided an opportunity to ask questions and all were answered. The patient agreed with the plan and demonstrated an understanding of the instructions.   The patient was advised to call back or seek an in-person evaluation if the symptoms worsen or if the condition fails to improve as anticipated.  I provided 25 minutes of non-face-to-face time during this encounter.  Scribed byHazel Sams, PA-C  I reviewed the above note and attest to the accuracy of the documentation.   Bo Merino, MD

## 2020-07-07 HISTORY — PX: COLONOSCOPY: SHX174

## 2020-07-10 DIAGNOSIS — S72451D Displaced supracondylar fracture without intracondylar extension of lower end of right femur, subsequent encounter for closed fracture with routine healing: Secondary | ICD-10-CM | POA: Diagnosis not present

## 2020-07-12 DIAGNOSIS — H59031 Cystoid macular edema following cataract surgery, right eye: Secondary | ICD-10-CM | POA: Diagnosis not present

## 2020-07-16 ENCOUNTER — Ambulatory Visit (INDEPENDENT_AMBULATORY_CARE_PROVIDER_SITE_OTHER): Payer: PPO

## 2020-07-16 DIAGNOSIS — Z0000 Encounter for general adult medical examination without abnormal findings: Secondary | ICD-10-CM | POA: Diagnosis not present

## 2020-07-16 NOTE — Progress Notes (Signed)
Virtual Visit via Telephone Note  I connected with  NAMITA EISENBERGER on 07/16/20 at  1:00 PM EST by telephone and verified that I am speaking with the correct person using two identifiers.  Medicare Annual Wellness visit completed telephonically due to Covid-19 pandemic.   Persons participating in this call: This Health Coach and this patient.   Location: Patient: Home Provider: Office   I discussed the limitations, risks, security and privacy concerns of performing an evaluation and management service by telephone and the availability of in person appointments. The patient expressed understanding and agreed to proceed.  Unable to perform video visit due to video visit attempted and failed and/or patient does not have video capability.   Some vital signs may be absent or patient reported.   Willette Brace, LPN    Subjective:   AIYANNAH DISHMAN is a 80 y.o. female who presents for Medicare Annual (Subsequent) preventive examination.  Review of Systems     Cardiac Risk Factors include: advanced age (>86men, >30 women);dyslipidemia;hypertension;obesity (BMI >30kg/m2)     Objective:    There were no vitals filed for this visit. There is no height or weight on file to calculate BMI.  Advanced Directives 07/16/2020 01/30/2020 01/18/2020 01/16/2020 01/12/2020 01/04/2020 01/03/2020  Does Patient Have a Medical Advance Directive? No Yes Yes Yes Yes No No  Type of Advance Directive - (No Data) (No Data) (No Data) (No Data) - -  Does patient want to make changes to medical advance directive? - No - Patient declined No - Patient declined No - Patient declined No - Patient declined - -  Would patient like information on creating a medical advance directive? No - Patient declined - - - - No - Patient declined No - Patient declined  Pre-existing out of facility DNR order (yellow form or pink MOST form) - - - - - - -    Current Medications (verified) Outpatient Encounter Medications as of  07/16/2020  Medication Sig  . atenolol (TENORMIN) 50 MG tablet Take 1 tablet (50 mg total) by mouth daily.  . Calcium Carbonate-Vitamin D (CALCIUM 600+D) 600-400 MG-UNIT tablet Take 1 tablet by mouth daily.   . ferrous sulfate 325 (65 FE) MG EC tablet Take 1 tablet (325 mg total) by mouth daily with breakfast.  . FLUoxetine (PROZAC) 20 MG capsule Take 1 capsule (20 mg total) by mouth daily.  . folic acid (FOLVITE) Q000111Q MCG tablet Take 800 mcg daily by mouth.   . furosemide (LASIX) 40 MG tablet Take 1 tablet (40 mg total) by mouth daily as needed. As needed for weight gain >/= 3 pounds. Notify provider if meds given.  . gabapentin (NEURONTIN) 300 MG capsule TAKE 1 CAPSULE BY MOUTH TWICE A DAY  . Ginger, Zingiber officinalis, (GINGER ROOT PO) Take by mouth.  . hydroxychloroquine (PLAQUENIL) 200 MG tablet Take 1 tablet (200 mg total) by mouth daily.  . Multiple Vitamin (MULTIVITAMIN WITH MINERALS) TABS tablet Take 1 tablet by mouth daily.   . Omega-3 1000 MG CAPS Take 1 capsule by mouth daily.   . polyethylene glycol (MIRALAX / GLYCOLAX) 17 g packet Take 17 g by mouth as needed.  . rosuvastatin (CRESTOR) 10 MG tablet Take 1 tablet (10 mg total) by mouth daily.  . tamsulosin (FLOMAX) 0.4 MG CAPS capsule Take 1 capsule (0.4 mg total) by mouth daily. Give after same meal each day. Do not open, crush or chew tabs. (Patient taking differently: Take 0.4 mg by mouth as needed.  Give after same meal each day. Do not open, crush or chew tabs.)  . TART CHERRY PO Take by mouth.  . Turmeric (QC TUMERIC COMPLEX PO) Take by mouth.   No facility-administered encounter medications on file as of 07/16/2020.    Allergies (verified) Ativan [lorazepam], Haldol [haloperidol], Epinephrine, Levofloxacin, Lipitor [atorvastatin], Robaxin [methocarbamol], and Trovan [alatrofloxacin]   History: Past Medical History:  Diagnosis Date  . Anemia   . Anxiety   . Arthritis   . Atelectasis, left    left lower lung  . Blood  transfusion without reported diagnosis   . Cataract cortical, senile, bilateral 03/19/2018  . Complication of anesthesia 2012   "irritated trachea" with cough x 1 year from intubation 2012.  smaller tube used with other surgeries  . Depression    just lost her son a month ago  . Esophageal hernia   . Fibromyalgia   . Headache    occasionally  . HTN (hypertension)   . Hx of colonic polyps   . Hydronephrosis of right kidney   . Hypercholesterolemia   . Hypertensive retinopathy of both eyes 03/19/2018  . 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 Obesity   . 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 Osteoporosis   . Renal angiomyolipoma   . Rheumatoid arthritis [facility])    Past Surgical History:  Procedure Laterality Date  . ABDOMINAL HYSTERECTOMY  1969  . BACK SURGERY  2012   rod in back-  lumbar disc removal with bone graft  . blephoroplasty     bilateral  . BREAST BIOPSY  1989  . CATARACT EXTRACTION, BILATERAL    . CHOLECYSTECTOMY  2010  . EXCISIONAL TOTAL KNEE ARTHROPLASTY WITH ANTIBIOTIC SPACERS Left 11/14/2016   Procedure: EXCISIONAL LEFT TOTAL KNEE ARTHROPLASTY WITH  PLACEMENT OF ANTIBIOTIC SPACERS;  Surgeon: [name], MD;  Location: [facility];  Service: Orthopedics;  Laterality: Left;  . JOINT REPLACEMENT    . NISSEN FUNDOPLICATION    . ORIF FEMUR FRACTURE Right 01/04/2020   Procedure: OPEN REDUCTION INTERNAL FIXATION (ORIF) DISTAL FEMUR FRACTURE;  Surgeon: Shona Needles, MD;  Location: [place];  Service: Orthopedics;  Laterality: Right;  . PARAESOPHAGEAL HERNIA REPAIR  1989  . REVISION TOTAL KNEE ARTHROPLASTY Left 06/09/2017  . TONSILLECTOMY  1946  . TOTAL HIP ARTHROPLASTY Right 08/31/2012   Procedure: TOTAL HIP ARTHROPLASTY ANTERIOR APPROACH;  Surgeon: Mauri Pole, MD;  Location: [facility];  Service: Orthopedics;  Laterality: Right;  . TOTAL KNEE ARTHROPLASTY  2001   left  . TOTAL KNEE ARTHROPLASTY  2010   right   . TOTAL KNEE REVISION Left 06/09/2017   Procedure: LEFT REVISION KNEE ARTHROPLASTY;  Surgeon: [name], MD;  Location: [place];  Service: Orthopedics;  Laterality: Left;   Family History  Problem Relation Age of Onset  . Diabetes Father   . Osteoarthritis Father   . Heart disease Father   . Stroke Father   . Osteoarthritis Mother   . Stroke Mother   . Pneumonia Mother   . Colon cancer Sister 37  . Colon polyps Other        neice/nephew  . Diabetes Sister   . Diabetes Brother    Social History   Socioeconomic History  . Marital status: Divorced    Spouse name: Not on file  . Number of children: 3  . Years of education: College  . 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 Smoking status: Never Smoker  . Smokeless tobacco: Never Used  Vaping Use  . Vaping Use: Never used  Substance and Sexual Activity  . Alcohol use: No  . Drug use: No  . Sexual activity: Never  Other Topics Concern  . Not on file  Social History Narrative   Patient lives at home alone.   Caffeine Use: 1 cup daily   Social Determinants of Health   Financial Resource Strain: Low Risk   . Difficulty of Paying Living Expenses: Not hard at all  Food Insecurity: No Food Insecurity  . Worried About Charity fundraiser in the Last Year: Never true  . Ran Out of Food in the Last Year: Never true  Transportation Needs: No Transportation Needs  . Lack of Transportation (Medical): No  . Lack of Transportation (Non-Medical): No  Physical Activity: Sufficiently Active  . Days of Exercise per Week: 5 days  . Minutes of Exercise per Session: 60 min  Stress: No Stress Concern Present  . Feeling of Stress : Not at all  Social Connections: Moderately Isolated  . Frequency of Communication with Friends and Family: More than three times a week  . Frequency of Social Gatherings with Friends and Family: Once a  week  . Attends Religious Services: 1 to 4 times per year  . Active Member of Clubs or Organizations: No  . Attends Archivist Meetings: Never  . Marital Status: Divorced    Tobacco Counseling Counseling given: Not Answered   Clinical Intake:  Pre-visit preparation completed: Yes  Pain : No/denies pain     BMI - recorded: 31.45 Nutritional Status: BMI > 30  Obese Nutritional Risks: None Diabetes: No  How often do you need to have someone help you when you read instructions, pamphlets, or other written materials from your doctor or pharmacy?: 1 - Never  Diabetic?no Interpreter Needed?: No  Information entered by :: Charlott Rakes, LPN   Activities of Daily Living In your present state of health, do you have any difficulty performing the following activities: 07/16/2020 01/03/2020  Hearing? N -  Vision? N -  Comment - -  Difficulty concentrating or making decisions? N -  Walking or climbing stairs? Y -  Dressing or bathing? N -  Doing errands, shopping? N N  Preparing Food and eating ? N -  Using the Toilet? N -  In the past six months, have you accidently leaked urine? N -  Do you have problems with loss of bowel control? N -  Managing your Medications? N -  Managing your Finances? N -  Housekeeping or managing your Housekeeping? N -  Some recent data might be hidden    Patient Care Team: Leamon Arnt, MD as PCP - General (Family Medicine) Paralee Cancel, MD as Consulting Physician (Orthopedic Surgery) Hortencia Pilar, MD as Consulting Physician (Ophthalmology) Bo Merino, MD as Consulting Physician (Rheumatology)  Indicate any recent Medical Services you may have received from other than [facility] providers in the past year (date may be approximate).     Assessment:   This is a routine wellness examination for Narcissa.  Hearing/Vision screen  Hearing Screening   125Hz  250Hz  500Hz  1000Hz  2000Hz  3000Hz  4000Hz  6000Hz  8000Hz   Right ear:            Left ear:  Comments: Pt denies hearing at this time  Vision Screening Comments: Pt follows dr weaver for annual eye exams  Dietary issues and exercise activities discussed: Current Exercise Habits: Home exercise routine, Type of exercise: strength training/weights;stretching;walking, Time (Minutes): 60, Frequency (Times/Week): 5, Weekly Exercise (Minutes/Week): 300  Goals    . Patient Stated     Want to get better and feel better     . Patient Stated     None at this time      Depression Screen PHQ 2/9 Scores 07/16/2020 04/19/2019 10/13/2018 04/08/2018 04/08/2018 08/17/2017 04/03/2017  PHQ - 2 Score 0 0 0 1 1 5  0  PHQ- 9 Score - - 0 - - 16 -    Fall Risk Fall Risk  07/16/2020 04/19/2019 10/13/2018 04/08/2018 04/03/2017  Falls in the past year? 1 0 1 Yes Yes  Number falls in past yr: 1 - 1 2 or more -  Injury with Fall? 1 0 0 No -  Comment broke femur right side - - got up to walk around the bed; fell backwards on bench at the foot of the bed. 2nd fall last month, BR at hs fell from bathroom, felt one of the legs gave away, balance  -  Risk for fall due to : Impaired balance/gait;Impaired mobility - History of fall(s) - -  Risk for fall due to: Comment - - Patient has drop foot.  - -  Follow up Falls prevention discussed Falls evaluation completed;Education provided;Falls prevention discussed - Education provided -    FALL RISK PREVENTION PERTAINING TO THE HOME:  Any stairs in or around the home? Yes  If so, are there any without handrails? No  Home free of loose throw rugs in walkways, pet beds, electrical cords, etc? Yes  Adequate lighting in your home to reduce risk of falls? Yes   ASSISTIVE DEVICES UTILIZED TO PREVENT FALLS:  Life alert? No  Use of a cane, walker or w/c? Yes  Grab bars in the bathroom? Yes  Shower chair or bench in shower? Yes  Elevated toilet seat or a handicapped toilet? Yes   TIMED UP AND GO:  Was the test performed? No .    Cognitive  Function: MMSE - Mini Mental State Exam 04/08/2018  Not completed: (No Data)     6CIT Screen 07/16/2020 04/19/2019  What Year? 0 points 0 points  What month? 0 points 0 points  What time? - 0 points  Count back from 20 0 points 0 points  Months in reverse 0 points 0 points  Repeat phrase 0 points 0 points  Total Score - 0    Immunizations Immunization History  Administered Date(s) Administered  . Fluad Quad(high Dose 65+) 04/26/2019, 05/08/2020  . Influenza, High Dose Seasonal PF 03/27/2018  . Influenza,inj,Quad PF,6+ Mos 03/13/2017  . Influenza-Unspecified 03/26/2018, 06/04/2020  . PFIZER SARS-COV-2 Vaccination 09/02/2019, 09/30/2019, 05/08/2020  . Pneumococcal Conjugate-13 02/26/2018  . Pneumococcal Polysaccharide-23 05/17/2019      Flu Vaccine status: Up to date  Done 05/08/20 Pneumococcal vaccine status: Up to date  Covid-19 vaccine status: Completed vaccines  Qualifies for Shingles Vaccine? Yes   Zostavax completed No   Shingrix Completed?: No.    Education has been provided regarding the importance of this vaccine. Patient has been advised to call insurance company to determine out of pocket expense if they have not yet received this vaccine. Advised may also receive vaccine at local pharmacy or Health Dept. Verbalized acceptance and understanding.  Screening Tests Health Maintenance  Topic Date Due  . COLONOSCOPY (Pts 45-54yrs Insurance coverage will need to be confirmed)  11/05/2020 (Originally 01/26/2019)  . DEXA SCAN  11/12/2020 (Originally 05/18/2005)  . INFLUENZA VACCINE  Completed  . COVID-19 Vaccine  Completed  . PNA vac Low Risk Adult  Completed    Health Maintenance  There are no preventive care reminders to display for this patient.  Colorectal cancer screening: pt request to address in may when come in for physical 11/2020, she stated she would like to recoop from broken femur first.  Mammogram status: Completed 02/22/19. Repeat every year  Bone  density:  pt request to address in may when come in for physical 11/2020, she stated she would like to recoop from broken femur first.   Additional Screening:   Vision Screening: Recommended annual ophthalmology exams for early detection of glaucoma and other disorders of the eye. Is the patient up to date with their annual eye exam?  Yes  Who is the provider or what is the name of the office in which the patient attends annual eye exams? Dr Kathlen Mody   Dental Screening: Recommended annual dental exams for proper oral hygiene  Community Resource Referral / Chronic Care Management: CRR required this visit?  No   CCM required this visit?  No      Plan:     I have personally reviewed and noted the following in the patient's chart:   . Medical and social history . Use of alcohol, tobacco or illicit drugs  . Current medications and supplements . Functional ability and status . Nutritional status . Physical activity . Advanced directives . List of other physicians . Hospitalizations, surgeries, and ER visits in previous 12 months . Vitals . Screenings to include cognitive, depression, and falls . Referrals and appointments  In addition, I have reviewed and discussed with patient certain preventive protocols, quality metrics, and best practice recommendations. A written personalized care plan for preventive services as well as general preventive health recommendations were provided to patient.     Willette Brace, LPN   08/06/8655   Nurse Notes: None

## 2020-07-16 NOTE — Patient Instructions (Addendum)
Laura Chambers , Thank you for taking time to come for your Medicare Wellness Visit. I appreciate your ongoing commitment to your health goals. Please review the following plan we discussed and let me know if I can assist you in the future.   Screening recommendations/referrals: Colonoscopy: Pt request to address in 11/2020 Mammogram: Done 02/22/19 Bone Density: pt request to address in 11/2020 Recommended yearly ophthalmology/optometry visit for glaucoma screening and checkup Recommended yearly dental visit for hygiene and checkup  Vaccinations: Influenza vaccine: Up to date Done 06/04/20 Pneumococcal vaccine: Up to date Shingles vaccine: Shingrix discussed. Please contact your pharmacy for coverage information.    Covid-19:Completed 2/26, 3/26, 05/08/20  Advanced directives: Pt has information   Conditions/risks identified: none at this time  Next appointment: Follow up in one year for your annual wellness visit    Preventive Care 65 Years and Older, Female Preventive care refers to lifestyle choices and visits with your health care provider that can promote health and wellness. What does preventive care include?  A yearly physical exam. This is also called an annual well check.  Dental exams once or twice a year.  Routine eye exams. Ask your health care provider how often you should have your eyes checked.  Personal lifestyle choices, including:  Daily care of your teeth and gums.  Regular physical activity.  Eating a healthy diet.  Avoiding tobacco and drug use.  Limiting alcohol use.  Practicing safe sex.  Taking low-dose aspirin every day.  Taking vitamin and mineral supplements as recommended by your health care provider. What happens during an annual well check? The services and screenings done by your health care provider during your annual well check will depend on your age, overall health, lifestyle risk factors, and family history of disease. Counseling  Your  health care provider may ask you questions about your:  Alcohol use.  Tobacco use.  Drug use.  Emotional well-being.  Home and relationship well-being.  Sexual activity.  Eating habits.  History of falls.  Memory and ability to understand (cognition).  Work and work Statistician.  Reproductive health. Screening  You may have the following tests or measurements:  Height, weight, and BMI.  Blood pressure.  Lipid and cholesterol levels. These may be checked every 5 years, or more frequently if you are over 80 years old.  Skin check.  Lung cancer screening. You may have this screening every year starting at age 49 if you have a 30-pack-year history of smoking and currently smoke or have quit within the past 15 years.  Fecal occult blood test (FOBT) of the stool. You may have this test every year starting at age 54.  Flexible sigmoidoscopy or colonoscopy. You may have a sigmoidoscopy every 5 years or a colonoscopy every 10 years starting at age 15.  Hepatitis C blood test.  Hepatitis B blood test.  Sexually transmitted disease (STD) testing.  Diabetes screening. This is done by checking your blood sugar (glucose) after you have not eaten for a while (fasting). You may have this done every 1-3 years.  Bone density scan. This is done to screen for osteoporosis. You may have this done starting at age 32.  Mammogram. This may be done every 1-2 years. Talk to your health care provider about how often you should have regular mammograms. Talk with your health care provider about your test results, treatment options, and if necessary, the need for more tests. Vaccines  Your health care provider may recommend certain vaccines, such as:  Influenza vaccine. This is recommended every year.  Tetanus, diphtheria, and acellular pertussis (Tdap, Td) vaccine. You may need a Td booster every 10 years.  Zoster vaccine. You may need this after age 55.  Pneumococcal 13-valent  conjugate (PCV13) vaccine. One dose is recommended after age 73.  Pneumococcal polysaccharide (PPSV23) vaccine. One dose is recommended after age 11. Talk to your health care provider about which screenings and vaccines you need and how often you need them. This information is not intended to replace advice given to you by your health care provider. Make sure you discuss any questions you have with your health care provider. Document Released: 07/20/2015 Document Revised: 03/12/2016 Document Reviewed: 04/24/2015 Elsevier Interactive Patient Education  2017 [place] Prevention in the Home Falls can cause injuries. They can happen to people of all ages. There are many things you can do to make your home safe and to help prevent falls. What can I do on the outside of my home?  Regularly fix the edges of walkways and driveways and fix any cracks.  Remove anything that might make you trip as you walk through a door, such as a raised step or threshold.  Trim any bushes or trees on the path to your home.  Use bright outdoor lighting.  Clear any walking paths of anything that might make someone trip, such as rocks or tools.  Regularly check to see if handrails are loose or broken. Make sure that both sides of any steps have handrails.  Any raised decks and porches should have guardrails on the edges.  Have any leaves, snow, or ice cleared regularly.  Use sand or salt on walking paths during winter.  Clean up any spills in your garage right away. This includes oil or grease spills. What can I do in the bathroom?  Use night lights.  Install grab bars by the toilet and in the tub and shower. Do not use towel bars as grab bars.  Use non-skid mats or decals in the tub or shower.  If you need to sit down in the shower, use a plastic, non-slip stool.  Keep the floor dry. Clean up any water that spills on the floor as soon as it happens.  Remove soap buildup in the tub or  shower regularly.  Attach bath mats securely with double-sided non-slip rug tape.  Do not have throw rugs and other things on the floor that can make you trip. What can I do in the bedroom?  Use night lights.  Make sure that you have a light by your bed that is easy to reach.  Do not use any sheets or blankets that are too big for your bed. They should not hang down onto the floor.  Have a firm chair that has side arms. You can use this for support while you get dressed.  Do not have throw rugs and other things on the floor that can make you trip. What can I do in the kitchen?  Clean up any spills right away.  Avoid walking on wet floors.  Keep items that you use a lot in easy-to-reach places.  If you need to reach something above you, use a strong step stool that has a grab bar.  Keep electrical cords out of the way.  Do not use floor polish or wax that makes floors slippery. If you must use wax, use non-skid floor wax.  Do not have throw rugs and other things on the floor that  can make you trip. What can I do with my stairs?  Do not leave any items on the stairs.  Make sure that there are handrails on both sides of the stairs and use them. Fix handrails that are broken or loose. Make sure that handrails are as long as the stairways.  Check any carpeting to make sure that it is firmly attached to the stairs. Fix any carpet that is loose or worn.  Avoid having throw rugs at the top or bottom of the stairs. If you do have throw rugs, attach them to the floor with carpet tape.  Make sure that you have a light switch at the top of the stairs and the bottom of the stairs. If you do not have them, ask someone to add them for you. What else can I do to help prevent falls?  Wear shoes that:  Do not have high heels.  Have rubber bottoms.  Are comfortable and fit you well.  Are closed at the toe. Do not wear sandals.  If you use a stepladder:  Make sure that it is fully  opened. Do not climb a closed stepladder.  Make sure that both sides of the stepladder are locked into place.  Ask someone to hold it for you, if possible.  Clearly mark and make sure that you can see:  Any grab bars or handrails.  First and last steps.  Where the edge of each step is.  Use tools that help you move around (mobility aids) if they are needed. These include:  Canes.  Walkers.  Scooters.  Crutches.  Turn on the lights when you go into a dark area. Replace any light bulbs as soon as they burn out.  Set up your furniture so you have a clear path. Avoid moving your furniture around.  If any of your floors are uneven, fix them.  If there are any pets around you, be aware of where they are.  Review your medicines with your doctor. Some medicines can make you feel dizzy. This can increase your chance of falling. Ask your doctor what other things that you can do to help prevent falls. This information is not intended to replace advice given to you by your health care provider. Make sure you discuss any questions you have with your health care provider. Document Released: 04/19/2009 Document Revised: 11/29/2015 Document Reviewed: 07/28/2014 Elsevier Interactive Patient Education  2017 [employer].

## 2020-08-16 DIAGNOSIS — H59031 Cystoid macular edema following cataract surgery, right eye: Secondary | ICD-10-CM | POA: Diagnosis not present

## 2020-08-16 DIAGNOSIS — Z961 Presence of intraocular lens: Secondary | ICD-10-CM | POA: Diagnosis not present

## 2020-09-11 ENCOUNTER — Other Ambulatory Visit: Payer: Self-pay | Admitting: Physician Assistant

## 2020-09-11 NOTE — Telephone Encounter (Signed)
Last Visit: 06/21/2020 Next Visit: message sent to front desk to schedule appt,  Labs: 05/16/2020, GFR 35, HDL 45, Triglycerides 190, BUN 27, Creat 1.41,  Eye exam: 05/21/2020  Current Dose per office note 06/21/2020, plaquenil 200 mg 1 tablet by mouth daily [id number] arthritis involving multiple sites with positive rheumatoid factor   Last Fill: 03/19/2020  Okay to refill Plaquenil?

## 2020-09-11 NOTE — Telephone Encounter (Signed)
I LMOM for patient to call to schedule a follow up appointment in May.

## 2020-09-11 NOTE — Telephone Encounter (Signed)
Please call patient to schedule appt, She will follow up in 4-5 months.

## 2020-10-19 ENCOUNTER — Telehealth: Payer: Self-pay | Admitting: Family Medicine

## 2020-10-19 NOTE — Chronic Care Management (AMB) (Signed)
  Chronic Care Management   Note  10/19/2020 Name: Laura Chambers MRN: [id number] DOB: Sep 14, 1940  Laura Chambers is a 80 y.o. year old female who is a primary care patient of Leamon Arnt, MD. I reached out to Terrilyn Saver by phone today in response to a referral sent by Ms. Lenn Cal Liz's PCP, Leamon Arnt, MD.   Ms. Gillean was given information about Chronic Care Management services today including:  1. CCM service includes personalized support from designated clinical staff supervised by her physician, including individualized plan of care and coordination with other care providers 2. 24/7 contact phone numbers for assistance for urgent and routine care needs. 3. Service will only be billed when office clinical staff spend 20 minutes or more in a month to coordinate care. 4. Only one practitioner may furnish and bill the service in a calendar month. 5. The patient may stop CCM services at any time (effective at the end of the month) by phone call to the office staff.   Patient did not agree to enrollment in care management services and does not wish to consider at this time.  Follow up plan:   Lauretta Grill Up[facility] Scheduler

## 2020-11-06 DIAGNOSIS — S72451D Displaced supracondylar fracture without intracondylar extension of lower end of right femur, subsequent encounter for closed fracture with routine healing: Secondary | ICD-10-CM | POA: Diagnosis not present

## 2020-11-14 ENCOUNTER — Encounter: Payer: PPO | Admitting: Family Medicine

## 2020-11-27 NOTE — Progress Notes (Deleted)
Office Visit Note  Patient: Laura Chambers             Date of Birth: 30-Oct-1940           MRN: [id number]             PCP: Leamon Arnt, MD Referring: Leamon Arnt, MD Visit Date: 12/11/2020 Occupation: @GUAROCC @  Subjective:  No chief complaint on file.   History of Present Illness: Laura Chambers is a 79 y.o. female ***   Activities of Daily Living:  Patient reports morning stiffness for *** {minute/hour:19697}.   Patient {ACTIONS;DENIES/REPORTS:21021675::"Denies"} nocturnal pain.  Difficulty dressing/grooming: {ACTIONS;DENIES/REPORTS:21021675::"Denies"} Difficulty climbing stairs: {ACTIONS;DENIES/REPORTS:21021675::"Denies"} Difficulty getting out of chair: {ACTIONS;DENIES/REPORTS:21021675::"Denies"} Difficulty using hands for taps, buttons, cutlery, and/or writing: {ACTIONS;DENIES/REPORTS:21021675::"Denies"}  No Rheumatology ROS completed.   PMFS History:  Patient Active Problem List   Diagnosis Date Noted  . Anemia associated with chronic renal failure 05/16/2020  . Chronic constipation 01/12/2020  . Acute blood loss anemia 01/06/2020  . Supracondylar fracture of right femur ([place]) 01/03/2020  . 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 Rheumatoid arthritis involving multiple sites with positive rheumatoid factor ([place]) 10/13/2018  . Hx of colonic polyps 12/15/2017  . 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 Fibromyalgia 12/15/2017  . History of total right hip replacement 12/15/2017  . DDD (degenerative disc disease), lumbar 12/15/2017  . 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 Left foot drop 12/15/2017  . 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 Status post revision of total knee, left 06/09/2017  . Obesity, Class III, BMI 40-49.9 (morbid obesity) ([place]) 12/28/2016  . Essential hypertension 12/08/2016  .  Urine retention 12/02/2016  . Peripheral neuropathy 01/12/2014  . 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 Dyslipidemia 06/16/2012    Past Medical History:  Diagnosis Date  . Anemia   . Anxiety   . Arthritis   . Atelectasis, left    left lower lung  . Blood transfusion without reported diagnosis   . Cataract cortical, senile, bilateral 03/19/2018  . Complication of anesthesia 2012   "irritated trachea" with cough x 1 year from intubation 2012.  smaller tube used with other surgeries  . Depression    just lost her son a month ago  . Esophageal hernia   . Fibromyalgia   . Headache    occasionally  . HTN (hypertension)   . Hx of colonic polyps   . Hydronephrosis of right kidney   . Hypercholesterolemia   . Hypertensive retinopathy of both eyes 03/19/2018  . Neuropathy due to medical condition ([place])    bilateral lower legs  . Obesity   . 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 Osteoporosis   . Renal angiomyolipoma   . Rheumatoid arthritis ([place])     Family History  Problem Relation Age of Onset  . Diabetes Father   . Osteoarthritis Father   . Heart disease Father   . Stroke Father   . Osteoarthritis Mother   . Stroke Mother   . Pneumonia Mother   . Colon cancer Sister 15  . Colon polyps Other        neice/nephew  . Diabetes Sister   . Diabetes Brother    Past Surgical History:  Procedure Laterality Date  . ABDOMINAL HYSTERECTOMY  1969  . BACK SURGERY  2012   rod in  back-  lumbar disc removal with bone graft  . blephoroplasty     bilateral  . BREAST BIOPSY  1989  . CATARACT EXTRACTION, BILATERAL    . CHOLECYSTECTOMY  2010  . EXCISIONAL TOTAL KNEE ARTHROPLASTY WITH ANTIBIOTIC SPACERS Left 11/14/2016   Procedure: EXCISIONAL LEFT TOTAL KNEE ARTHROPLASTY WITH  PLACEMENT OF ANTIBIOTIC SPACERS;  Surgeon: [name], MD;  Location: [facility];  Service: Orthopedics;  Laterality: Left;  . JOINT REPLACEMENT     . NISSEN FUNDOPLICATION    . ORIF FEMUR FRACTURE Right 01/04/2020   Procedure: OPEN REDUCTION INTERNAL FIXATION (ORIF) DISTAL FEMUR FRACTURE;  Surgeon: Shona Needles, MD;  Location: [facility];  Service: Orthopedics;  Laterality: Right;  . PARAESOPHAGEAL HERNIA REPAIR  1989  . REVISION TOTAL KNEE ARTHROPLASTY Left 06/09/2017  . TONSILLECTOMY  1946  . TOTAL HIP ARTHROPLASTY Right 08/31/2012   Procedure: TOTAL HIP ARTHROPLASTY ANTERIOR APPROACH;  Surgeon: Mauri Pole, MD;  [facility];  Service: Orthopedics;  Laterality: Right;  . TOTAL KNEE ARTHROPLASTY  2001   left  . TOTAL KNEE ARTHROPLASTY  2010   right  . TOTAL KNEE REVISION Left 06/09/2017   Procedure: LEFT REVISION KNEE ARTHROPLASTY;  Surgeon: [name], MD;  Location: [facility];  Service: Orthopedics;  Laterality: Left;   Social History   Social History Narrative   Patient lives at home alone.   Caffeine Use: 1 cup daily   Immunization History  Administered Date(s) Administered  . Fluad Quad(high Dose 65+) 04/26/2019, 05/08/2020  . Influenza, High Dose Seasonal PF 03/27/2018  . Influenza,inj,Quad PF,6+ Mos 03/13/2017  . Influenza-Unspecified 03/26/2018, 06/04/2020  . PFIZER(Purple Top)SARS-COV-2 Vaccination 09/02/2019, 09/30/2019, 05/08/2020  . Pneumococcal Conjugate-13 02/26/2018  . Pneumococcal Polysaccharide-23 05/17/2019     Objective: Vital Signs: There were no vitals taken for this visit.   Physical Exam   Musculoskeletal Exam: ***  CDAI Exam: CDAI Score: -- Patient Global: --; Provider Global: -- Swollen: --; Tender: -- Joint Exam 12/11/2020   No joint exam has been documented for this visit   There is currently no information documented on the homunculus. Go to the Rheumatology activity and complete the homunculus joint exam.  Investigation: No additional findings.  Imaging: No results found.  Recent Labs: Lab Results  Component Value Date   WBC 8.6 05/16/2020   HGB 13.9  05/16/2020   PLT 246 05/16/2020   NA 140 05/16/2020   K 5.1 05/16/2020   CL 104 05/16/2020   CO2 31 05/16/2020   GLUCOSE 94 05/16/2020   BUN 27 (H) 05/16/2020   CREATININE 1.41 (H) 05/16/2020   BILITOT 1.1 05/16/2020   ALKPHOS 93 02/26/2018   AST 26 05/16/2020   ALT 16 05/16/2020   PROT 6.6 05/16/2020   ALBUMIN 4.0 02/26/2018   CALCIUM 9.6 05/16/2020   GFRAA 41 (L) 05/16/2020    Speciality Comments: PLQ Eye Exam: 05/21/2020 WNL @ [facility]    Procedures:  No procedures performed Allergies: Ativan [lorazepam], Haldol [haloperidol], Epinephrine, Levofloxacin, Lipitor [atorvastatin], Robaxin [methocarbamol], and Trovan [alatrofloxacin]   Assessment / Plan:     Visit Diagnoses: Rheumatoid arthritis involving multiple sites with positive rheumatoid factor ([place])  High risk medication use  History of total right hip replacement  S/P total knee replacement, right  Status post revision of total knee, left  Infected prosthetic knee joint, sequela  DDD (degenerative disc disease), lumbar  Left foot drop  Fibromyalgia  Renal angiomyolipoma  Chronic kidney disease (CKD), active medical management  without dialysis, stage 3 (moderate) (HCC)  Essential hypertension  Dyslipidemia  Hx of colonic polyps  Hx of peripheral neuropathy  History of anxiety  History of depression  Displaced supracondylar fracture without intracondylar extension of lower end of right femur, subsequent encounter for closed fracture with routine healing  Orders: No orders of the defined types were placed in this encounter.  No orders of the defined types were placed in this encounter.   Face-to-face time spent with patient was *** minutes. Greater than 50% of time was spent in counseling and coordination of care.  Follow-Up Instructions: No follow-ups on file.   Ofilia Neas, PA-C  Note - This record has been created using Dragon software.  Chart creation errors have been  sought, but may not always  have been located. Such creation errors do not reflect on  the standard of medical care.

## 2020-12-04 ENCOUNTER — Encounter: Payer: PPO | Admitting: Family Medicine

## 2020-12-11 ENCOUNTER — Ambulatory Visit: Payer: PPO | Admitting: Rheumatology

## 2020-12-11 DIAGNOSIS — S72451D Displaced supracondylar fracture without intracondylar extension of lower end of right femur, subsequent encounter for closed fracture with routine healing: Secondary | ICD-10-CM

## 2020-12-11 DIAGNOSIS — I10 Essential (primary) hypertension: Secondary | ICD-10-CM

## 2020-12-11 DIAGNOSIS — T8459XS Infection and inflammatory reaction due to other internal joint prosthesis, sequela: Secondary | ICD-10-CM

## 2020-12-11 DIAGNOSIS — Z79899 Other long term (current) drug therapy: Secondary | ICD-10-CM

## 2020-12-11 DIAGNOSIS — M21372 Foot drop, left foot: Secondary | ICD-10-CM

## 2020-12-11 DIAGNOSIS — Z8659 Personal history of other mental and behavioral disorders: Secondary | ICD-10-CM

## 2020-12-11 DIAGNOSIS — N183 Chronic kidney disease, stage 3 unspecified: Secondary | ICD-10-CM

## 2020-12-11 DIAGNOSIS — M0579 Rheumatoid arthritis with rheumatoid factor of multiple sites without organ or systems involvement: Secondary | ICD-10-CM

## 2020-12-11 DIAGNOSIS — Z86010 Personal history of colonic polyps: Secondary | ICD-10-CM

## 2020-12-11 DIAGNOSIS — Z96641 Presence of right artificial hip joint: Secondary | ICD-10-CM

## 2020-12-11 DIAGNOSIS — E785 Hyperlipidemia, unspecified: Secondary | ICD-10-CM

## 2020-12-11 DIAGNOSIS — M5136 Other intervertebral disc degeneration, lumbar region: Secondary | ICD-10-CM

## 2020-12-11 DIAGNOSIS — M797 Fibromyalgia: Secondary | ICD-10-CM

## 2020-12-11 DIAGNOSIS — Z96651 Presence of right artificial knee joint: Secondary | ICD-10-CM

## 2020-12-11 DIAGNOSIS — Z96652 Presence of left artificial knee joint: Secondary | ICD-10-CM

## 2020-12-11 DIAGNOSIS — Z8669 Personal history of other diseases of the nervous system and sense organs: Secondary | ICD-10-CM

## 2020-12-11 DIAGNOSIS — D1771 Benign lipomatous neoplasm of kidney: Secondary | ICD-10-CM

## 2020-12-17 ENCOUNTER — Other Ambulatory Visit: Payer: Self-pay | Admitting: Physician Assistant

## 2020-12-17 NOTE — Telephone Encounter (Signed)
Last Visit: 06/21/2020  Next Visit: 12/31/2020  Labs: 05/16/2020, GFR 35, HDL 45, Triglycerides 190, BUN 27, Creat 1.41  Eye exam: 05/21/2020   Current Dose per office note 06/21/2020, plaquenil 200 mg 1 tablet by mouth daily  [id number] arthritis involving multiple sites with positive rheumatoid factor    Last Fill: 09/11/2020  Patient to update labs at upcoming appointment.  Okay to refill PLQ?

## 2020-12-17 NOTE — Progress Notes (Signed)
Office Visit Note  Patient: Laura Chambers             Date of Birth: [birth year]           MRN: [id number]             PCP: Leamon Arnt, MD Referring: Leamon Arnt, MD Visit Date: 12/31/2020 Occupation: @GUAROCC @  Subjective:  Medication management.   History of Present Illness: Laura Chambers is a 80 y.o. female with a history of seropositive rheumatoid arthritis.  She has been on Plaquenil 1 tablet p.o. daily.  She states has been working well for her.  She denies any history of joint swelling.  She continues to have some stiffness in her hands.  She states last June she fell and fractured her right femur.  She had a rod and right total hip replacement by Dr. [name].  According to the patient it is healing slowly.  Her both both knees are replaced.  She also has neuropathy for which she takes gabapentin.  She mobilizes with the help of a walker.  Activities of Daily Living:  Patient reports morning stiffness for 0 minutes.   Patient Reports nocturnal pain.  Difficulty dressing/grooming: Denies Difficulty climbing stairs: Reports Difficulty getting out of chair: Denies Difficulty using hands for taps, buttons, cutlery, and/or writing: Reports  Review of Systems  Constitutional:  Negative for fatigue.  HENT:  Negative for mouth sores, mouth dryness and nose dryness.   Eyes:  Positive for dryness. Negative for pain and itching.  Respiratory:  Negative for shortness of breath and difficulty breathing.   Cardiovascular:  Negative for chest pain and palpitations.  Gastrointestinal:  Negative for blood in stool, constipation and diarrhea.  Endocrine: Negative for increased urination.  Genitourinary:  Negative for difficulty urinating.  Musculoskeletal:  Positive for myalgias, muscle tenderness and myalgias. Negative for joint pain, joint pain, joint swelling and morning stiffness.  Skin:  Negative for color change, rash and redness.  Allergic/Immunologic: Negative for  susceptible to infections.  Neurological:  Positive for numbness. Negative for dizziness, headaches, memory loss and weakness.  Hematological:  Negative for bruising/bleeding tendency.  Psychiatric/Behavioral:  Negative for confusion.    PMFS History:  Patient Active Problem List   Diagnosis Date Noted   Anemia associated with chronic renal failure 05/16/2020   Chronic constipation 01/12/2020   Acute blood loss anemia 01/06/2020   Supracondylar fracture of right femur ([place]) 01/03/2020   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    Rheumatoid arthritis involving multiple sites with positive rheumatoid factor ([place]) 10/13/2018   Hx of colonic polyps 12/15/2017   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   Chronic kidney disease (CKD), active medical management without dialysis, stage 3 (moderate) 12/15/2017   Fibromyalgia 12/15/2017   History of total right hip replacement 12/15/2017   DDD (degenerative disc disease), lumbar 12/15/2017   S/P total knee replacement, right 12/15/2017   Left foot drop 12/15/2017   Reactive depression (situational) 08/17/2017   Status post revision of total knee, left 06/09/2017   Obesity, Class III, BMI 40-49.9 (morbid obesity) ([place]) 12/28/2016   Essential hypertension 12/08/2016   Urine retention 12/02/2016   Peripheral neuropathy 01/12/2014   Benign hypertensive heart disease without heart failure 06/16/2012   Dyslipidemia 06/16/2012    Past Medical History:  Diagnosis Date   Anemia    Anxiety    Arthritis    Atelectasis, left    left lower lung   Blood transfusion without reported diagnosis  Cataract cortical, senile, bilateral 16/04/9603   Complication of anesthesia 2012   "irritated trachea" with cough x 1 year from intubation 2012.  smaller tube used with other surgeries   Depression    just lost her son a month ago   Esophageal hernia    Fibromyalgia    Headache    occasionally   HTN (hypertension)    Hx of colonic  polyps    Hydronephrosis of right kidney    Hypercholesterolemia    Hypertensive retinopathy of both eyes 03/19/2018   Neuropathy due to medical condition ([place])    bilateral lower legs   Obesity    Osteoarthritis (arthritis due to wear and tear of joints)    Osteoporosis    Renal angiomyolipoma    Rheumatoid arthritis ([place])     Family History  Problem Relation Age of Onset   Diabetes Father    Osteoarthritis Father    Heart disease Father    Stroke Father    Osteoarthritis Mother    Stroke Mother    Pneumonia Mother    Colon cancer Sister 21   Colon polyps Other        neice/nephew   Diabetes Sister    Diabetes Brother    Past Surgical History:  Procedure Laterality Date   ABDOMINAL HYSTERECTOMY  1969   BACK SURGERY  2012   rod in back-  lumbar disc removal with bone graft   blephoroplasty     bilateral   BREAST BIOPSY  1989   CATARACT EXTRACTION Bilateral    05/2020, 06/2020   CATARACT EXTRACTION, BILATERAL     CHOLECYSTECTOMY  2010   EXCISIONAL TOTAL KNEE ARTHROPLASTY WITH ANTIBIOTIC SPACERS Left 11/14/2016   Procedure: EXCISIONAL LEFT TOTAL KNEE ARTHROPLASTY WITH  PLACEMENT OF ANTIBIOTIC SPACERS;  Surgeon: [name], MD;  [facility];  Service: Orthopedics;  Laterality: Left;   JOINT REPLACEMENT     NISSEN FUNDOPLICATION     ORIF FEMUR FRACTURE Right 01/04/2020   Procedure: OPEN REDUCTION INTERNAL FIXATION (ORIF) DISTAL FEMUR FRACTURE;  Surgeon: Shona Needles, MD;  Location: [facility];  Service: Orthopedics;  Laterality: Right;   PARAESOPHAGEAL HERNIA REPAIR  1989   REVISION TOTAL KNEE ARTHROPLASTY Left 06/09/2017   TONSILLECTOMY  1946   TOTAL HIP ARTHROPLASTY Right 08/31/2012   Procedure: TOTAL HIP ARTHROPLASTY ANTERIOR APPROACH;  Surgeon: Mauri Pole, MD;  [facility];  Service: Orthopedics;  Laterality: Right;   TOTAL KNEE ARTHROPLASTY  2001   left   TOTAL KNEE ARTHROPLASTY  2010   right   TOTAL KNEE REVISION Left 06/09/2017    Procedure: LEFT REVISION KNEE ARTHROPLASTY;  Surgeon: [name], MD;  Location: [facility];  Service: Orthopedics;  Laterality: Left;   Social History   Social History Narrative   Patient lives at home alone.   Caffeine Use: 1 cup daily   Immunization History  Administered Date(s) Administered   Fluad Quad(high Dose 65+) 04/26/2019, 05/08/2020   Influenza, High Dose Seasonal PF 03/27/2018   Influenza,inj,Quad PF,6+ Mos 03/13/2017   Influenza-Unspecified 03/26/2018, 06/04/2020   PFIZER(Purple Top)SARS-COV-2 Vaccination 09/02/2019, 09/30/2019, 05/08/2020   Pneumococcal Conjugate-13 02/26/2018   Pneumococcal Polysaccharide-23 05/17/2019     Objective: Vital Signs: BP (!) 154/87 (BP Location: Left Arm, Patient Position: Sitting, Cuff Size: Normal)   Pulse 61   Resp 16   Ht 5\' 7"  (1.702 m)   Wt 211 lb 3.2 oz (95.8 kg)   BMI 33.08 kg/m    Physical Exam  Vitals and nursing note reviewed.  Constitutional:      Appearance: She is well-developed.  HENT:     Head: Normocephalic and atraumatic.  Eyes:     Conjunctiva/sclera: Conjunctivae normal.  Cardiovascular:     Rate and Rhythm: Normal rate and regular rhythm.     Heart sounds: Normal heart sounds.  Pulmonary:     Effort: Pulmonary effort is normal.     Breath sounds: Normal breath sounds.  Abdominal:     General: Bowel sounds are normal.     Palpations: Abdomen is soft.  Musculoskeletal:     Cervical back: Normal range of motion.  Lymphadenopathy:     Cervical: No cervical adenopathy.  Skin:    General: Skin is warm and dry.     Capillary Refill: Capillary refill takes less than 2 seconds.  Neurological:     Mental Status: She is alert and oriented to person, place, and time.  Psychiatric:        Behavior: Behavior normal.     Musculoskeletal Exam: C-spine was noted range of motion.  Shoulder joints and elbow joints with good range of motion.  There was no synovitis on examination of wrist joints or MCP  joints.  She had PIP and DIP thickening consistent with osteoarthritis.  Hip joints were difficult to assess in the sitting position.  Knee joints were in good range of motion.  There was no tenderness over ankles or MTPs.  CDAI Exam: CDAI Score: 0.4  Patient Global: 2 mm; Provider Global: 2 mm Swollen: 0 ; Tender: 0  Joint Exam 12/31/2020   No joint exam has been documented for this visit   There is currently no information documented on the homunculus. Go to the Rheumatology activity and complete the homunculus joint exam.  Investigation: No additional findings.  Imaging: No results found.  Recent Labs: Lab Results  Component Value Date   WBC 8.6 05/16/2020   HGB 13.9 05/16/2020   PLT 246 05/16/2020   NA 140 05/16/2020   K 5.1 05/16/2020   CL 104 05/16/2020   CO2 31 05/16/2020   GLUCOSE 94 05/16/2020   BUN 27 (H) 05/16/2020   CREATININE 1.41 (H) 05/16/2020   BILITOT 1.1 05/16/2020   ALKPHOS 93 02/26/2018   AST 26 05/16/2020   ALT 16 05/16/2020   PROT 6.6 05/16/2020   ALBUMIN 4.0 02/26/2018   CALCIUM 9.6 05/16/2020   GFRAA 41 (L) 05/16/2020    Speciality Comments: PLQ Eye Exam: 05/21/2020 WNL @ [facility]    Procedures:  No procedures performed Allergies: Ativan [lorazepam], Haldol [haloperidol], Epinephrine, Levofloxacin, Lipitor [atorvastatin], Robaxin [methocarbamol], and Trovan [alatrofloxacin]   Assessment / Plan:     Visit Diagnoses: Rheumatoid arthritis involving multiple sites with positive rheumatoid factor (HCC) - Severe rheumatoid arthritis. She has severe intercarpal radiocarpal joint space narrowing and subtalar joint narrowing on x-rays updated on 12/21/19.  She had no synovitis on my examination today.  She has been on low-dose Plaquenil which has been working well for her.  High risk medication use - plaquenil 200 mg 1 tablet by mouth daily. PLQ Eye Exam: 05/21/2020 - Plan: CBC with Differential/Platelet, COMPLETE METABOLIC PANEL WITH GFR today  and then every 5 months.  Displaced supracondylar fracture without intracondylar extension of lower end of right femur, subsequent encounter for closed fracture with routine healing - She fell in June 2021 and fractured her right femur.  She was hospitalized and transferred to a SNF for 3 weeks.  She  continues to follow up with Dr. Doreatha Martin.   History of total right hip replacement - Followed by Dr. Doreatha Martin after right femur fracture in June 2021.   S/P total knee replacement, right - She fell in June 2021 and fractured her right femur. Followed by Dr. Doreatha Martin.    Status post revision of total knee, left - Revision 2018 by Dr. Ninfa Linden.  She has residual left foot drop.  Infected prosthetic knee joint, sequela  DDD (degenerative disc disease), lumbar-she has chronic lower back pain.  Left foot drop - She has some gait instability secondary to left foot drop.  Osteoporosis screening - DEXA ordered by PCP several years ago according to the patient.  She has not had an updated DEXA since her right femur fracture in June.  Patient declined DEXA.  Fibromyalgia-she continues to have some generalized pain and discomfort.  Chronic kidney disease (CKD), active medical management without dialysis, stage 3 (moderate) (HCC)-her GFR is in 40s.  Renal angiomyolipoma  Essential hypertension  Dyslipidemia  Hx of colonic polyps  History of anxiety  Hx of peripheral neuropathy  History of depression  Orders: Orders Placed This Encounter  Procedures   CBC with Differential/Platelet   COMPLETE METABOLIC PANEL WITH GFR    No orders of the defined types were placed in this encounter.    Follow-Up Instructions: Return in about 5 months (around 06/02/2021) for Rheumatoid arthritis.   Bo Merino, MD  Note - This record has been created using Editor, commissioning.  Chart creation errors have been sought, but may not always  have been located. Such creation errors do not reflect on  the  standard of medical care.

## 2020-12-31 ENCOUNTER — Encounter: Payer: Self-pay | Admitting: Rheumatology

## 2020-12-31 ENCOUNTER — Other Ambulatory Visit: Payer: Self-pay

## 2020-12-31 ENCOUNTER — Ambulatory Visit (INDEPENDENT_AMBULATORY_CARE_PROVIDER_SITE_OTHER): Payer: PPO | Admitting: Rheumatology

## 2020-12-31 VITALS — BP 154/87 | HR 61 | Resp 16 | Ht 67.0 in | Wt 211.2 lb

## 2020-12-31 DIAGNOSIS — Z79899 Other long term (current) drug therapy: Secondary | ICD-10-CM | POA: Diagnosis not present

## 2020-12-31 DIAGNOSIS — M5136 Other intervertebral disc degeneration, lumbar region: Secondary | ICD-10-CM | POA: Diagnosis not present

## 2020-12-31 DIAGNOSIS — Z13820 Encounter for screening for osteoporosis: Secondary | ICD-10-CM | POA: Diagnosis not present

## 2020-12-31 DIAGNOSIS — Z96659 Presence of unspecified artificial knee joint: Secondary | ICD-10-CM

## 2020-12-31 DIAGNOSIS — Z8669 Personal history of other diseases of the nervous system and sense organs: Secondary | ICD-10-CM

## 2020-12-31 DIAGNOSIS — Z96651 Presence of right artificial knee joint: Secondary | ICD-10-CM

## 2020-12-31 DIAGNOSIS — T8459XS Infection and inflammatory reaction due to other internal joint prosthesis, sequela: Secondary | ICD-10-CM | POA: Diagnosis not present

## 2020-12-31 DIAGNOSIS — Z96652 Presence of left artificial knee joint: Secondary | ICD-10-CM | POA: Diagnosis not present

## 2020-12-31 DIAGNOSIS — E785 Hyperlipidemia, unspecified: Secondary | ICD-10-CM

## 2020-12-31 DIAGNOSIS — Z860100 Personal history of colon polyps, unspecified: Secondary | ICD-10-CM

## 2020-12-31 DIAGNOSIS — S72451D Displaced supracondylar fracture without intracondylar extension of lower end of right femur, subsequent encounter for closed fracture with routine healing: Secondary | ICD-10-CM | POA: Diagnosis not present

## 2020-12-31 DIAGNOSIS — M51369 Other intervertebral disc degeneration, lumbar region without mention of lumbar back pain or lower extremity pain: Secondary | ICD-10-CM

## 2020-12-31 DIAGNOSIS — Z96641 Presence of right artificial hip joint: Secondary | ICD-10-CM | POA: Diagnosis not present

## 2020-12-31 DIAGNOSIS — M797 Fibromyalgia: Secondary | ICD-10-CM

## 2020-12-31 DIAGNOSIS — N183 Chronic kidney disease, stage 3 unspecified: Secondary | ICD-10-CM | POA: Diagnosis not present

## 2020-12-31 DIAGNOSIS — M0579 Rheumatoid arthritis with rheumatoid factor of multiple sites without organ or systems involvement: Secondary | ICD-10-CM | POA: Diagnosis not present

## 2020-12-31 DIAGNOSIS — I10 Essential (primary) hypertension: Secondary | ICD-10-CM

## 2020-12-31 DIAGNOSIS — Z8659 Personal history of other mental and behavioral disorders: Secondary | ICD-10-CM

## 2020-12-31 DIAGNOSIS — D1771 Benign lipomatous neoplasm of kidney: Secondary | ICD-10-CM

## 2020-12-31 DIAGNOSIS — M21372 Foot drop, left foot: Secondary | ICD-10-CM | POA: Diagnosis not present

## 2020-12-31 NOTE — Patient Instructions (Signed)
Vaccines You are taking a medication(s) that can suppress your immune system.  The following immunizations are recommended: Flu annually Covid-19  Td/Tdap (tetanus, diphtheria, pertussis) every 10 years Pneumonia (Prevnar 15 then Pneumovax 23 at least 1 year apart.  Alternatively, can take Prevnar 20 without needing additional dose) Shingrix (after age 81): 2 doses from 4 weeks to 6 months apart  Please check with your PCP to make sure you are up to date.

## 2021-01-01 LAB — CBC WITH DIFFERENTIAL/PLATELET
Absolute Monocytes: 764 cells/uL (ref 200–950)
Basophils Absolute: 46 cells/uL (ref 0–200)
Basophils Relative: 0.5 %
Eosinophils Absolute: 432 cells/uL (ref 15–500)
Eosinophils Relative: 4.7 %
HCT: 43.1 % (ref 35.0–45.0)
Hemoglobin: 13.9 g/dL (ref 11.7–15.5)
Lymphs Abs: 1831 cells/uL (ref 850–3900)
MCH: 29.1 pg (ref 27.0–33.0)
MCHC: 32.3 g/dL (ref 32.0–36.0)
MCV: 90.2 fL (ref 80.0–100.0)
MPV: 12.4 fL (ref 7.5–12.5)
Monocytes Relative: 8.3 %
Neutro Abs: 6127 cells/uL (ref 1500–7800)
Neutrophils Relative %: 66.6 %
Platelets: 231 10*3/uL (ref 140–400)
RBC: 4.78 10*6/uL (ref 3.80–5.10)
RDW: 13 % (ref 11.0–15.0)
Total Lymphocyte: 19.9 %
WBC: 9.2 10*3/uL (ref 3.8–10.8)

## 2021-01-01 LAB — COMPLETE METABOLIC PANEL WITH GFR
AG Ratio: 1.5 (calc) (ref 1.0–2.5)
ALT: 17 U/L (ref 6–29)
AST: 26 U/L (ref 10–35)
Albumin: 4.1 g/dL (ref 3.6–5.1)
Alkaline phosphatase (APISO): 89 U/L (ref 37–153)
BUN/Creatinine Ratio: 19 (calc) (ref 6–22)
BUN: 24 mg/dL (ref 7–25)
CO2: 30 mmol/L (ref 20–32)
Calcium: 9.4 mg/dL (ref 8.6–10.4)
Chloride: 103 mmol/L (ref 98–110)
Creat: 1.29 mg/dL — ABNORMAL HIGH (ref 0.60–0.88)
GFR, Est African American: 45 mL/min/{1.73_m2} — ABNORMAL LOW (ref >=60)
GFR, Est Non African American: 39 mL/min/{1.73_m2} — ABNORMAL LOW (ref >=60)
Globulin: 2.7 g/dL (calc) (ref 1.9–3.7)
Glucose, Bld: 81 mg/dL (ref 65–99)
Potassium: 4.9 mmol/L (ref 3.5–5.3)
Sodium: 141 mmol/L (ref 135–146)
Total Bilirubin: 1 mg/dL (ref 0.2–1.2)
Total Protein: 6.8 g/dL (ref 6.1–8.1)

## 2021-01-01 NOTE — Progress Notes (Signed)
CBC is normal.  GFR is low and stable.

## 2021-01-15 ENCOUNTER — Encounter: Payer: Self-pay | Admitting: Family Medicine

## 2021-01-15 ENCOUNTER — Ambulatory Visit (INDEPENDENT_AMBULATORY_CARE_PROVIDER_SITE_OTHER): Payer: PPO | Admitting: Family Medicine

## 2021-01-15 ENCOUNTER — Other Ambulatory Visit: Payer: Self-pay

## 2021-01-15 VITALS — BP 136/78 | HR 61 | Temp 97.9°F | Wt 209.0 lb

## 2021-01-15 DIAGNOSIS — Z780 Asymptomatic menopausal state: Secondary | ICD-10-CM

## 2021-01-15 DIAGNOSIS — Z7185 Encounter for immunization safety counseling: Secondary | ICD-10-CM | POA: Diagnosis not present

## 2021-01-15 DIAGNOSIS — Z0000 Encounter for general adult medical examination without abnormal findings: Secondary | ICD-10-CM | POA: Diagnosis not present

## 2021-01-15 DIAGNOSIS — G6289 Other specified polyneuropathies: Secondary | ICD-10-CM | POA: Diagnosis not present

## 2021-01-15 DIAGNOSIS — N183 Chronic kidney disease, stage 3 unspecified: Secondary | ICD-10-CM

## 2021-01-15 DIAGNOSIS — Z860100 Personal history of colon polyps, unspecified: Secondary | ICD-10-CM

## 2021-01-15 DIAGNOSIS — F329 Major depressive disorder, single episode, unspecified: Secondary | ICD-10-CM | POA: Diagnosis not present

## 2021-01-15 DIAGNOSIS — I119 Hypertensive heart disease without heart failure: Secondary | ICD-10-CM

## 2021-01-15 DIAGNOSIS — Z8781 Personal history of (healed) traumatic fracture: Secondary | ICD-10-CM

## 2021-01-15 DIAGNOSIS — E785 Hyperlipidemia, unspecified: Secondary | ICD-10-CM

## 2021-01-15 DIAGNOSIS — I10 Essential (primary) hypertension: Secondary | ICD-10-CM | POA: Diagnosis not present

## 2021-01-15 DIAGNOSIS — Z1231 Encounter for screening mammogram for malignant neoplasm of breast: Secondary | ICD-10-CM

## 2021-01-15 DIAGNOSIS — M0579 Rheumatoid arthritis with rheumatoid factor of multiple sites without organ or systems involvement: Secondary | ICD-10-CM

## 2021-01-15 LAB — LIPID PANEL
Cholesterol: 118 mg/dL (ref 0–200)
HDL: 49.4 mg/dL (ref >39.00)
LDL Cholesterol: 44 mg/dL (ref 0–99)
NonHDL: 68.74
Total CHOL/HDL Ratio: 2
Triglycerides: 123 mg/dL (ref 0.0–149.0)
VLDL: 24.6 mg/dL (ref 0.0–40.0)

## 2021-01-15 LAB — TSH: TSH: 2.4 u[IU]/mL (ref 0.35–5.50)

## 2021-01-15 MED ORDER — SHINGRIX 50 MCG/0.5ML IM SUSR: 0.5000 mL | Freq: Once | INTRAMUSCULAR | 0 refills | Status: AC

## 2021-01-15 NOTE — Patient Instructions (Signed)
Please return in 6 months for hypertension and recheck follow up.   I will release your lab results to you on your MyChart account with further instructions. Please reply with any questions.    Please call [phone], [facility] to get set up for your colonoscopy. It is overdue.   I have ordered a mammogram and/or bone density for you as we discussed today: [x]   Mammogram  [x]   Bone Density  Please call the office checked below to schedule your appointment:       [x]   [facility] [place], [place]  Please take the prescription for Shingrix to the pharmacy so they may administer the vaccinations. Your insurance will then cover the injections.   If you have any questions or concerns, please don't hesitate to send me a message via MyChart or call the office at [phone]. Thank you for visiting with [place] today! It's our pleasure caring for you.

## 2021-01-15 NOTE — Progress Notes (Signed)
Subjective  Chief Complaint  Patient presents with   Annual Exam    Crackers for breakfast this morning    Fall    Little over a week ago, hit the back of her head. Son was able to get her of the floor. Headache for several days   Health Maintenance    Aware she is due for colonoscopy, seen previous by [facility]    HPI: Laura Chambers is a 80 y.o. female who presents to [facility] [place] today for a Female Wellness Visit. She also has the concerns and/or needs as listed above in the chief complaint. These will be addressed in addition to the Health Maintenance Visit.   Wellness Visit: annual visit with health maintenance review and exam without Pap  HM: Due for mammogram and bone density screening.  Has history of significant femur fracture.  Discussed benefits of bone density screening and if osteoporotic, can start treatment.  Counseling done.  She gets her mammograms at [facility].  Also due for colonoscopy will [facility].  Has history of precancerous polyps. Chronic disease f/u and/or acute problem visit: (deemed necessary to be done in addition to the wellness visit): Unsteady gait: Uses walker.  He has left chronic foot drop and balance problems.  Right fracture of the femur, chronic and unhealed per her report.  Sees Ortho.  Lost her balance and fell backwards last week.  Fortunately no significant injuries.  Has had physical therapy. Reviewed rheumatology notes.  Manages rheumatoid arthritis.  On Plaquenil.  Reviewed recent CBC and CMP.  Stable.  Eye exam up-to-date. Chronic peripheral neuropathy on gabapentin.  Stable Chronic kidney disease current stable by recent lab works. Hypertension remains well controlled.  Recently saw cardiology.  I reviewed those notes and studies.  No medication changes made.  No chest pain or concerns for ischemic heart disease. Hyperlipidemia on statin.  Due for recheck. Situational depression stable on Prozac.  Remains  independent.  Mood is stable.   Assessment  1. Annual physical exam   2. Rheumatoid arthritis involving multiple sites with positive rheumatoid factor (HCC)   3. Essential hypertension   4. Other polyneuropathy   5. Chronic kidney disease (CKD), active medical management without dialysis, stage 3 (moderate)   6. Benign hypertensive heart disease without heart failure   7. Dyslipidemia   8. History of femur fracture supracondylar 2021, right   9. Asymptomatic menopausal state   10. Encounter for screening mammogram for breast cancer   11. Reactive depression (situational)   12. Hx of colonic polyps   13. Vaccine counseling      Plan  Female Wellness Visit: Age appropriate Health Maintenance and Prevention measures were discussed with patient. Included topics are cancer screening recommendations, ways to keep healthy (see AVS) including dietary and exercise recommendations, regular eye and dental care, use of seat belts, and avoidance of moderate alcohol use and tobacco use.  Patient to schedule colonoscopy with GI.  Referrals for mammogram and bone density is made.  Counseling done. BMI: discussed patient's BMI and encouraged positive lifestyle modifications to help get to or maintain a target BMI. HM needs and immunizations were addressed and ordered. See below for orders. See HM and immunization section for updates.  Counseling done for Shingrix vaccination.  Prescription given. Routine labs and screening tests ordered including cmp, cbc and lipids where appropriate. Discussed recommendations regarding Vit D and calcium supplementation (see AVS)  Chronic disease management visit and/or acute problem  visit: Chronic kidney disease, hypertension and hyperlipidemia have all been stable.  Recheck lipids today.  Tolerates statin well.  Continue antihypertensives.  Follows up with cardiology annually Unsteady gait, chronic foot drop, nonhealing femur fracture: Discussed fall prevention.   Discussed high risk for fractures. Femur fracture: DEXA ordered.  Depending on results may be managed by me or rheumatology. Mood is stable on Prozac.  We will continue chronically RA stable.  Continue Plaquenil  Follow up: 6 months to recheck. Orders Placed This Encounter  Procedures   MM DIGITAL SCREENING BILATERAL   DG Bone Density   Lipid panel   TSH   Meds ordered this encounter  Medications   Zoster Vaccine Adjuvanted [facility]) injection    Sig: Inject 0.5 mLs into the muscle once for 1 dose. Please give 2nd dose 2-6 months after first dose    Dispense:  2 each    Refill:  0      Body mass index is 32.73 kg/m. Wt Readings from Last 3 Encounters:  01/15/21 209 lb (94.8 kg)  12/31/20 211 lb 3.2 oz (95.8 kg)  05/16/20 200 lb 12.8 oz (91.1 kg)     Patient Active Problem List   Diagnosis Date Noted   Rheumatoid arthritis involving multiple sites with positive rheumatoid factor ([place]) 10/13/2018    Priority: High   Chronic kidney disease (CKD), active medical management without dialysis, stage 3 (moderate) 12/15/2017    Priority: High    6/29-01/11/2020 AKI superimposed on CKD due to hypotension and hypovolemia in the context of surgical repair of periprosthetic knee fracture Creatinine up to 2.03 and GFR down to 23 Creatinine 1.17/GFR 44 at discharge     Fibromyalgia 12/15/2017    Priority: High   Reactive depression (situational) 08/17/2017    Priority: High   Essential hypertension 12/08/2016    Priority: High   Peripheral neuropathy 01/12/2014    Priority: High   Benign hypertensive heart disease without heart failure 06/16/2012    Priority: High   Dyslipidemia 06/16/2012    Priority: High   Anemia associated with chronic renal failure 05/16/2020    Priority: Medium   History of femur fracture supracondylar 2021, right 01/03/2020    Priority: Medium   Hx of colonic polyps 12/15/2017    Priority: Medium   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 Trenton Gammon 2012     Chronic constipation 01/12/2020    Priority: Low   History of total right hip replacement 12/15/2017    Priority: Low    2014 Dr. [name]     S/P total knee replacement, right 12/15/2017    Priority: Low    2010 Dr. Maureen Ralphs     Left foot drop 12/15/2017    Priority: Low   Status post revision of total knee, left 06/09/2017    Priority: Low   Urine retention 12/02/2016    Priority: Low   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     6/29-01/11/2020 right periprosthetic knee fracture sustained in a mechanical fall in the context of foot drop and arch dysfunction. 6/30 open reduction internal fixation of the right distal femoral fracture by Dr. Doreatha Martin. Lovenox DVT prophylaxis postop.     Health Maintenance  Topic Date Due   Zoster Vaccines- Shingrix (1 of 2) Never done   DEXA SCAN  Never done   COLONOSCOPY (Pts 45-45yrs  Insurance coverage will need to be confirmed)  01/26/2019   COVID-19 Vaccine (4 - Booster for Pfizer series) 01/31/2021 (Originally 08/08/2020)   INFLUENZA VACCINE  02/04/2021   PNA vac Low Risk Adult  Completed   HPV VACCINES  Aged Out   Immunization History  Administered Date(s) Administered   Fluad Quad(high Dose 65+) 04/26/2019, 05/08/2020   Influenza, High Dose Seasonal PF 03/27/2018   Influenza,inj,Quad PF,6+ Mos 03/13/2017   Influenza-Unspecified 03/26/2018, 06/04/2020   PFIZER(Purple Top)SARS-COV-2 Vaccination 09/02/2019, 09/30/2019, 05/08/2020   Pneumococcal Conjugate-13 02/26/2018   Pneumococcal Polysaccharide-23 05/17/2019   We updated and reviewed the patient's past history in detail and it is documented below. Allergies: Patient is allergic to ativan [lorazepam], haldol [haloperidol], epinephrine, levofloxacin, lipitor [atorvastatin], robaxin [methocarbamol], and trovan [alatrofloxacin]. Past  Medical History Patient  has a past medical history of Anemia, Anxiety, Arthritis, Atelectasis, left, Blood transfusion without reported diagnosis, Cataract cortical, senile, bilateral (93/26/7124), Complication of anesthesia (2012), Depression, Esophageal hernia, Fibromyalgia, Headache, HTN (hypertension), colonic polyps, Hydronephrosis of right kidney, Hypercholesterolemia, Hypertensive retinopathy of both eyes (03/19/2018), Neuropathy due to medical condition ([place]), Obesity, Osteoarthritis (arthritis due to wear and tear of joints), Osteoporosis, Renal angiomyolipoma, and Rheumatoid arthritis ([place]). Past Surgical History Patient  has a past surgical history that includes Total knee arthroplasty (2001); Cholecystectomy (2010); Tonsillectomy (1946); Abdominal hysterectomy (1969); Paraesophageal hernia repair (1989); Breast biopsy (1989); Nissen fundoplication; Total knee arthroplasty (2010); blephoroplasty; Back surgery (2012); Total hip arthroplasty (Right, 08/31/2012); Excisional total knee arthroplasty with antibiotic spacers (Left, 11/14/2016); Revision total knee arthroplasty (Left, 06/09/2017); Total knee revision (Left, 06/09/2017); Joint replacement; ORIF femur fracture (Right, 01/04/2020); Cataract extraction, bilateral; and Cataract extraction (Bilateral). Family History: Patient family history includes Colon cancer (age of onset: 51) in her sister; Colon polyps in an other family member; Diabetes in her brother, father, and sister; Heart disease in her father; Osteoarthritis in her father and mother; Pneumonia in her mother; Stroke in her father and mother. Social History:  Patient  reports that she has never smoked. She has never used smokeless tobacco. She reports that she does not drink alcohol and does not use drugs.  Review of Systems: Constitutional: negative for fever or malaise Ophthalmic: negative for photophobia, double vision or loss of vision Cardiovascular: negative for chest  pain, dyspnea on exertion, or new LE swelling Respiratory: negative for SOB or persistent cough Gastrointestinal: negative for abdominal pain, change in bowel habits or melena Genitourinary: negative for dysuria or gross hematuria, no abnormal uterine bleeding or disharge Musculoskeletal: negative for new gait disturbance or muscular weakness Integumentary: negative for new or persistent rashes, no breast lumps Neurological: negative for TIA or stroke symptoms Psychiatric: negative for SI or delusions Allergic/Immunologic: negative for hives  Patient Care Team    Relationship Specialty Notifications Start End  Leamon Arnt, MD PCP - General Family Medicine  05/11/19   Paralee Cancel, MD Consulting Physician Orthopedic Surgery  04/03/17   Hortencia Pilar, MD Consulting Physician Ophthalmology  04/01/18   Bo Merino, MD Consulting Physician Rheumatology  04/08/18     Objective  Vitals: BP 136/78   Pulse 61   Temp 97.9 F (36.6 C) (Temporal)   Wt 209 lb (94.8 kg)   SpO2 98%   BMI 32.73 kg/m  General:  Well developed, well nourished, no acute distress  Psych:  Alert and orientedx3,normal mood and affect HEENT:  Normocephalic, atraumatic, non-icteric sclera,  supple neck without adenopathy, mass or thyromegaly Cardiovascular:  Normal S1, S2, RRR without gallop, rub  or murmur Respiratory:  Good breath sounds bilaterally, CTAB with normal respiratory effort Gastrointestinal: normal bowel sounds, soft, non-tender, no noted masses. No HSM Extremities no edema Commons side effects, risks, benefits, and alternatives for medications and treatment plan prescribed today were discussed, and the patient expressed understanding of the given instructions. Patient is instructed to call or message via MyChart if he/she has any questions or concerns regarding our treatment plan. No barriers to understanding were identified. We discussed Red Flag symptoms and signs in detail. Patient expressed  understanding regarding what to do in case of urgent or emergency type symptoms.  Medication list was reconciled, printed and provided to the patient in AVS. Patient instructions and summary information was reviewed with the patient as documented in the AVS. This note was prepared with assistance of Dragon voice recognition software. Occasional wrong-word or sound-a-like substitutions may have occurred due to the inherent limitations of voice recognition software  This visit occurred during the SARS-CoV-2 public health emergency.  Safety protocols were in place, including screening questions prior to the visit, additional usage of staff PPE, and extensive cleaning of exam room while observing appropriate contact time as indicated for disinfecting solutions.

## 2021-01-18 ENCOUNTER — Other Ambulatory Visit: Payer: Self-pay | Admitting: Physician Assistant

## 2021-01-18 ENCOUNTER — Other Ambulatory Visit: Payer: Self-pay | Admitting: Family Medicine

## 2021-01-18 DIAGNOSIS — I10 Essential (primary) hypertension: Secondary | ICD-10-CM

## 2021-01-30 ENCOUNTER — Encounter: Payer: Self-pay | Admitting: Nurse Practitioner

## 2021-02-08 DIAGNOSIS — M810 Age-related osteoporosis without current pathological fracture: Secondary | ICD-10-CM | POA: Diagnosis not present

## 2021-02-08 DIAGNOSIS — Z780 Asymptomatic menopausal state: Secondary | ICD-10-CM | POA: Diagnosis not present

## 2021-02-08 DIAGNOSIS — M85852 Other specified disorders of bone density and structure, left thigh: Secondary | ICD-10-CM | POA: Diagnosis not present

## 2021-02-08 DIAGNOSIS — Z1231 Encounter for screening mammogram for malignant neoplasm of breast: Secondary | ICD-10-CM | POA: Diagnosis not present

## 2021-02-08 LAB — HM DEXA SCAN

## 2021-02-15 ENCOUNTER — Other Ambulatory Visit: Payer: Self-pay | Admitting: *Deleted

## 2021-02-21 ENCOUNTER — Encounter: Payer: Self-pay | Admitting: Family Medicine

## 2021-02-24 ENCOUNTER — Other Ambulatory Visit: Payer: Self-pay | Admitting: Physician Assistant

## 2021-02-24 ENCOUNTER — Other Ambulatory Visit: Payer: Self-pay | Admitting: Family Medicine

## 2021-02-25 NOTE — Telephone Encounter (Signed)
Next Visit: 07/09/2021  Last Visit: 12/31/2020  Labs: 12/31/2020 CBC is normal.  GFR is low and stable.  Eye exam: 05/21/2020 WNL    Current Dose per office note 12/31/2020: plaquenil 200 mg 1 tablet by mouth daily  [id number] arthritis involving multiple sites with positive rheumatoid factor   Last Fill: 12/17/2020  Okay to refill Plaquenil?

## 2021-02-26 DIAGNOSIS — R928 Other abnormal and inconclusive findings on diagnostic imaging of breast: Secondary | ICD-10-CM | POA: Diagnosis not present

## 2021-02-26 LAB — HM MAMMOGRAPHY

## 2021-02-28 ENCOUNTER — Ambulatory Visit: Payer: PPO | Admitting: Nurse Practitioner

## 2021-02-28 ENCOUNTER — Other Ambulatory Visit: Payer: Self-pay | Admitting: Nurse Practitioner

## 2021-02-28 ENCOUNTER — Encounter: Payer: Self-pay | Admitting: Nurse Practitioner

## 2021-02-28 VITALS — BP 138/72 | HR 62 | Ht 66.0 in | Wt 204.0 lb

## 2021-02-28 DIAGNOSIS — Z86010 Personal history of colonic polyps: Secondary | ICD-10-CM

## 2021-02-28 MED ORDER — PEG-KCL-NACL-NASULF-NA ASC-C 100 G PO SOLR: 1.0000 | Freq: Once | ORAL | 0 refills | Status: AC

## 2021-02-28 NOTE — Progress Notes (Signed)
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 Last  colonoscopy in 2015 with findings of only diverticulosis. She received colonoscopy recall letter from [place] in 2020 but has been unable to come back in due to the multiple orthopedic surgeries.  -- Patient looks and feels well.  She would like to proceed with colonoscopy. The risks and benefits of colonoscopy with possible polypectomy / biopsies were discussed and the patient agrees to proceed.   # Remote Nissen fundoplication, [id number]  # Difficult stick per patient though no issues with phlebotomy at PCP's office.  HISTORY OF PRESENT ILLNESS     Chief Complaint : Past due for colonoscopy  Laura Chambers is a 80 y.o. 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 See PMH below for any additional history.   Patient previously followed by Dr. Olevia Perches.  Her sister had colon cancer around the age of 57.  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 Last colonoscopy in 2015 with findings of only diverticulosis. She received a letter for recall colonoscopy in 2020 but hasn't been able to come back until now due to having had several orthopedic surgeries ( knee and femur).  She has no GI complaints  Data Reviewed:  Echo June 2018 Study Conclusions   - Left ventricle: The cavity size was normal. Wall thickness was    increased in a pattern of mild LVH. Systolic function was normal.    The estimated ejection fraction was in the range of 60% to 65%.    Wall motion was normal; there were no regional wall motion    abnormalities. The study is not technically sufficient to allow    evaluation of LV diastolic function. Doppler parameters  are    consistent with high ventricular filling pressure.  - Aortic valve: Transvalvular velocity was within the normal range.    There was no stenosis. There was no regurgitation.  - Mitral valve: Mildly calcified annulus. Transvalvular velocity    was within the normal range. There was no evidence for stenosis.    There was trivial regurgitation.  - Right ventricle: The cavity size was normal. Wall thickness was    normal. Systolic function was normal.  - Tricuspid valve: There was trivial regurgitation.  - Pulmonary arteries: Systolic pressure was mildly increased. PA    peak pressure: 44 mm Hg (S).    June 2022 CBC normal Creatinine 1.29   Past Medical History:  Diagnosis Date   Anemia    Anxiety    Arthritis    Atelectasis, left    left lower lung   Blood transfusion without reported diagnosis    Cataract cortical, senile, bilateral 123XX123   Complication of anesthesia 2012   "irritated trachea" with cough x 1 year from intubation 2012.  smaller tube used with other surgeries   Depression    just lost her son a month ago   Esophageal hernia    Fibromyalgia    Headache    occasionally   HTN (hypertension)    Hx of colonic polyps    Hydronephrosis of right kidney    Hypercholesterolemia  Hypertensive retinopathy of both eyes 03/19/2018   Neuropathy due to medical condition [facility])    bilateral lower legs   Obesity    Osteoarthritis (arthritis due to wear and tear of joints)    Osteoporosis    Renal angiomyolipoma    Rheumatoid arthritis [facility])      Past Surgical History:  Procedure Laterality Date   [place]  2012   rod in back-  lumbar disc removal with bone graft   blephoroplasty     bilateral   BREAST BIOPSY  1989   CATARACT EXTRACTION Bilateral    05/2020, 06/2020   CATARACT EXTRACTION, BILATERAL     CHOLECYSTECTOMY  2010   EXCISIONAL TOTAL KNEE ARTHROPLASTY WITH ANTIBIOTIC SPACERS Left 11/14/2016   Procedure:  EXCISIONAL LEFT TOTAL KNEE ARTHROPLASTY WITH  PLACEMENT OF ANTIBIOTIC SPACERS;  Surgeon: [name], MD;  Location: [facility];  Service: Orthopedics;  Laterality: Left;   JOINT REPLACEMENT     NISSEN FUNDOPLICATION     ORIF FEMUR FRACTURE Right 01/04/2020   Procedure: OPEN REDUCTION INTERNAL FIXATION (ORIF) DISTAL FEMUR FRACTURE;  Surgeon: Shona Needles, MD;  Location: [place];  Service: Orthopedics;  Laterality: Right;   PARAESOPHAGEAL HERNIA REPAIR  1989   REVISION TOTAL KNEE ARTHROPLASTY Left 06/09/2017   TONSILLECTOMY  1946   TOTAL HIP ARTHROPLASTY Right 08/31/2012   Procedure: TOTAL HIP ARTHROPLASTY ANTERIOR APPROACH;  Surgeon: Mauri Pole, MD;  Location: [facility];  Service: Orthopedics;  Laterality: Right;   TOTAL KNEE ARTHROPLASTY  2001   left   TOTAL KNEE ARTHROPLASTY  2010   right   TOTAL KNEE REVISION Left 06/09/2017   Procedure: LEFT REVISION KNEE ARTHROPLASTY;  Surgeon: [name], MD;  Location: [place];  Service: Orthopedics;  Laterality: Left;   Family History  Problem Relation Age of Onset   Diabetes Father    Osteoarthritis Father    Heart disease Father    Stroke Father    Osteoarthritis Mother    Stroke Mother    Pneumonia Mother    Colon cancer Sister 47   Colon polyps Other        neice/nephew   Diabetes Sister    Diabetes Brother    Social History   Tobacco Use   Smoking status: Never   Smokeless tobacco: Never  Vaping Use   Vaping Use: Never used  Substance Use Topics   Alcohol use: No   Drug use: No   Current Outpatient Medications  Medication Sig Dispense Refill   atenolol (TENORMIN) 50 MG tablet TAKE 1 TABLET BY MOUTH EVERY DAY 90 tablet 3   Calcium Carbonate-Vitamin D (CALCIUM 600+D) 600-400 MG-UNIT tablet Take 1 tablet by mouth daily.      FLUoxetine (PROZAC) 20 MG capsule Take 1 capsule (20 mg total) by mouth daily. 90 capsule 3   folic acid (FOLVITE) Q000111Q MCG tablet Take 800 mcg daily by mouth.      furosemide (LASIX)  40 MG tablet Take 1 tablet (40 mg total) by mouth daily as needed. As needed for weight gain >/= 3 pounds. Notify provider if meds given. 30 tablet 0   gabapentin (NEURONTIN) 300 MG capsule TAKE 1 CAPSULE BY MOUTH TWICE A DAY 180 capsule 3   Ginger, Zingiber officinalis, (GINGER ROOT PO) Take by mouth.     hydroxychloroquine (PLAQUENIL) 200 MG tablet TAKE 1 TABLET BY MOUTH EVERY DAY 90 tablet 0   Multiple Vitamin (MULTIVITAMIN WITH MINERALS) TABS  tablet Take 1 tablet by mouth daily.      Omega-3 1000 MG CAPS Take 1 capsule by mouth daily.      polyethylene glycol (MIRALAX / GLYCOLAX) 17 g packet Take 17 g by mouth as needed.     rosuvastatin (CRESTOR) 10 MG tablet Take 1 tablet (10 mg total) by mouth daily. 90 tablet 3   tamsulosin (FLOMAX) 0.4 MG CAPS capsule Take 1 capsule (0.4 mg total) by mouth daily. Give after same meal each day. Do not open, crush or chew tabs. (Patient taking differently: Take 0.4 mg by mouth as needed. Give after same meal each day. Do not open, crush or chew tabs.) 30 capsule 0   TART CHERRY PO Take by mouth.     Turmeric (QC TUMERIC COMPLEX PO) Take by mouth.     No current facility-administered medications for this visit.   Allergies  Allergen Reactions   Ativan [Lorazepam] Other (See Comments)    Highly irritable   Haldol [Haloperidol] Other (See Comments)    Vegetative State   Epinephrine Other (See Comments)    Reaction:  Increased pts BP and caused shoulder to jerk    Levofloxacin Nausea And Vomiting   Lipitor [Atorvastatin] Other (See Comments)    Reaction:  Joint pain and dizziness    Robaxin [Methocarbamol] Other (See Comments)    Dizziness    Trovan [Alatrofloxacin] Nausea And Vomiting    Pt states that this med caused pancreatitis.     Review of Systems:  All systems reviewed and negative except where noted in HPI.    PHYSICAL EXAM :    Wt Readings from Last 3 Encounters:  02/28/21 204 lb (92.5 kg)  01/15/21 209 lb (94.8 kg)  12/31/20 211  lb 3.2 oz (95.8 kg)    BP 138/72   Pulse 62   Ht '5\' 6"'$  (1.676 m)   Wt 204 lb (92.5 kg)   SpO2 98%   BMI 32.93 kg/m  Constitutional:  Pleasant female in no acute distress. Looks younger than age.  Psychiatric: Normal mood and affect. Behavior is normal. EENT: Pupils normal.  Conjunctivae are normal. No scleral icterus. Neck supple.  Cardiovascular: Normal rate, regular rhythm, 1+ BLE Pulmonary/chest: Effort normal and breath sounds normal. No wheezing, rales or rhonchi. Abdominal: Soft, nondistended, nontender. Bowel sounds active throughout. There are no masses palpable. No hepatomegaly. Neurological: Alert and oriented to person place and time. Skin: Skin is warm and dry. No rashes noted.  Tye Savoy, NP  02/28/2021, 2:50 PM  Cc:   Leamon Arnt, MD

## 2021-02-28 NOTE — Progress Notes (Signed)
Agree with the assessment and plan as outlined by Paula Guenther, NP. ° °Drevon Plog, DO, FACG ° °

## 2021-02-28 NOTE — Patient Instructions (Signed)
If you are age 81 or older, your body mass index should be between 23-30. Your Body mass index is 32.93 kg/m. If this is out of the aforementioned range listed, please consider follow up with your Primary Care Provider.  If you are age 61 or younger, your body mass index should be between 19-25. Your Body mass index is 32.93 kg/m. If this is out of the aformentioned range listed, please consider follow up with your Primary Care Provider.   You have been scheduled for a colonoscopy. Please follow written instructions given to you at your visit today.  Please pick up your prep supplies at the pharmacy within the next 1-3 days. If you use inhalers (even only as needed), please bring them with you on the day of your procedure.  The [facility] providers would like to encourage you to use [facility] to communicate with providers for non-urgent requests or questions.  Due to long hold times on the telephone, sending your provider a message by [facility] may be a faster and more efficient way to get a response.  Please allow 48 business hours for a response.  Please remember that this is for non-urgent requests.   It was a pleasure to see you today!  Thank you for trusting me with your gastrointestinal care!    Tye Savoy, NP

## 2021-03-06 ENCOUNTER — Encounter: Payer: Self-pay | Admitting: Family Medicine

## 2021-03-06 ENCOUNTER — Other Ambulatory Visit: Payer: Self-pay

## 2021-03-06 DIAGNOSIS — N6320 Unspecified lump in the left breast, unspecified quadrant: Secondary | ICD-10-CM

## 2021-03-10 ENCOUNTER — Other Ambulatory Visit: Payer: Self-pay | Admitting: Family Medicine

## 2021-03-10 DIAGNOSIS — F329 Major depressive disorder, single episode, unspecified: Secondary | ICD-10-CM

## 2021-03-12 ENCOUNTER — Encounter: Payer: PPO | Admitting: Family Medicine

## 2021-03-20 DIAGNOSIS — N6002 Solitary cyst of left breast: Secondary | ICD-10-CM | POA: Diagnosis not present

## 2021-03-25 ENCOUNTER — Other Ambulatory Visit: Payer: Self-pay | Admitting: Family Medicine

## 2021-03-25 DIAGNOSIS — E785 Hyperlipidemia, unspecified: Secondary | ICD-10-CM

## 2021-03-26 ENCOUNTER — Telehealth: Payer: Self-pay | Admitting: Nurse Practitioner

## 2021-03-26 ENCOUNTER — Other Ambulatory Visit: Payer: Self-pay

## 2021-03-26 NOTE — Telephone Encounter (Signed)
Inbound call from pt stating that her insurance isn't covering her prep. Pt wants to know is there an alternative. Please advise. Thank you.

## 2021-03-27 NOTE — Telephone Encounter (Signed)
Called and left VM for patient stating that I had called the pharmacy and insurance company and had not received and answer on which prep is covered. Also asked patient if patient would give me a call back to see if she is able to pick up a sample prep.

## 2021-04-03 ENCOUNTER — Encounter: Payer: Self-pay | Admitting: Gastroenterology

## 2021-04-03 ENCOUNTER — Ambulatory Visit (AMBULATORY_SURGERY_CENTER): Payer: PPO | Admitting: Gastroenterology

## 2021-04-03 ENCOUNTER — Other Ambulatory Visit: Payer: Self-pay

## 2021-04-03 VITALS — BP 123/66 | HR 63 | Temp 97.8°F | Resp 13 | Ht 66.0 in | Wt 204.0 lb

## 2021-04-03 DIAGNOSIS — Z800 Family history of malignant neoplasm of digestive organs: Secondary | ICD-10-CM

## 2021-04-03 DIAGNOSIS — K640 First degree hemorrhoids: Secondary | ICD-10-CM

## 2021-04-03 DIAGNOSIS — K5730 Diverticulosis of large intestine without perforation or abscess without bleeding: Secondary | ICD-10-CM | POA: Diagnosis not present

## 2021-04-03 DIAGNOSIS — D122 Benign neoplasm of ascending colon: Secondary | ICD-10-CM | POA: Diagnosis not present

## 2021-04-03 DIAGNOSIS — D123 Benign neoplasm of transverse colon: Secondary | ICD-10-CM

## 2021-04-03 DIAGNOSIS — Z86010 Personal history of colonic polyps: Secondary | ICD-10-CM

## 2021-04-03 MED ORDER — SODIUM CHLORIDE 0.9 % IV SOLN: 500.0000 mL | Freq: Once | INTRAVENOUS | Status: DC

## 2021-04-03 NOTE — Progress Notes (Signed)
Pt Drowsy. VSS. To PACU, report to RN. No anesthetic complications noted.  

## 2021-04-03 NOTE — Progress Notes (Signed)
Called to room to assist during endoscopic procedure.  Patient ID and intended procedure confirmed with present staff. Received instructions for my participation in the procedure from the performing physician.  

## 2021-04-03 NOTE — Op Note (Signed)
[facility] Patient Name: Laura Chambers Procedure Date: 04/03/2021 12:46 PM MRN: [id number] Endoscopist: Gerrit Heck , MD Age: 80 Referring MD:  Date of Birth: [birth year] Gender: Female Account #: [id number] Procedure:                Colonoscopy Indications:              Screening in patient at increased risk: Colorectal                            cancer in sister 33 or older, High risk colon                            cancer surveillance: Personal history of colonic                            polyps                           - 1987: Colonoscopy with 1-2 polyps measuring 6-9                            mm with path demonstrating high-grade dysplasia                           - 1989: 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 Repeat in                            5 years                           - 2004: Colonoscopy with diverticulosis. Repeat in                            5 years                           - 2008: Colonoscopy diverticulosis. Repeat 7 years                           - 2015: Colonoscopy with moderate pan                            diverticulosis with mucosal hypertrophy and  angulation. Recommended repeat in 5 years Medicines:                Monitored Anesthesia Care Procedure:                Pre-Anesthesia Assessment:                           - Prior to the procedure, a History and Physical                            was performed, and patient medications and                            allergies were reviewed. The patient's tolerance of                            previous anesthesia was also reviewed. The risks                            and benefits of the procedure and the  sedation                            options and risks were discussed with the patient.                            All questions were answered, and informed consent                            was obtained. Prior Anticoagulants: The patient has                            taken no previous anticoagulant or antiplatelet                            agents. ASA Grade Assessment: II - A patient with                            mild systemic disease. After reviewing the risks                            and benefits, the patient was deemed in                            satisfactory condition to undergo the procedure.                           After obtaining informed consent, the colonoscope                            was passed under direct vision. Throughout the                            procedure, the patient's blood pressure, pulse, and  oxygen saturations were monitored continuously. The                            Olympus CF-HQ190L (Serial# [id number]) Colonoscope was                            introduced through the anus and advanced to the the                            cecum, identified by appendiceal orifice and                            ileocecal valve. The colonoscopy was performed                            without difficulty. The patient tolerated the                            procedure well. The quality of the bowel                            preparation was good. The ileocecal valve,                            appendiceal orifice, and rectum were photographed. Scope In: 1:19:58 PM Scope Out: 1:40:40 PM Scope Withdrawal Time: 0 hours 18 minutes 45 seconds  Total Procedure Duration: 0 hours 20 minutes 42 seconds  Findings:                 The perianal and digital rectal examinations were                            normal.                           Three sessile polyps were found in the transverse                            colon (1) and ascending colon (2). The  polyps were                            3 to 8 mm in size. These polyps were removed with a                            cold snare. Resection and retrieval were complete.                            Estimated blood loss was minimal.                           Multiple small and large-mouthed diverticula were                            found in the sigmoid colon, descending colon and  transverse colon.                           Non-bleeding internal hemorrhoids were found during                            retroflexion. The hemorrhoids were small. Complications:            No immediate complications. Estimated Blood Loss:     Estimated blood loss was minimal. Impression:               - Three 3 to 8 mm polyps in the transverse colon                            and in the ascending colon, removed with a cold                            snare. Resected and retrieved.                           - Diverticulosis in the sigmoid colon, in the                            descending colon and in the transverse colon.                           - Non-bleeding internal hemorrhoids. Recommendation:           - Patient has a contact number available for                            emergencies. The signs and symptoms of potential                            delayed complications were discussed with the                            patient. Return to normal activities tomorrow.                            Written discharge instructions were provided to the                            patient.                           - Resume previous diet.                           - Continue present medications.                           - Await pathology results.                           - Repeat colonoscopy for surveillance based on  pathology results.                           - Return to GI clinic PRN.                           - Use fiber, for example Citrucel, Fibercon,  Konsyl                            or Metamucil. Gerrit Heck, MD 04/03/2021 1:46:04 PM

## 2021-04-03 NOTE — Progress Notes (Signed)
No problems noted in the recovery room. maw 

## 2021-04-03 NOTE — Patient Instructions (Addendum)
Handouts were given to your care partner on polyps, diverticulosis, and hemorrhoids. Per Dr. Bryan Lemma, use fiber, for example Citrucel, Fibercon, Konsyl or Metamucil. You may resume your current medications today. Await biopsy results.  May take 1-3 weeks to receive pathology results. Please call if any questions or concerns.      YOU HAD AN ENDOSCOPIC PROCEDURE TODAY AT [facility]:   Refer to the procedure report that was given to you for any specific questions about what was found during the examination.  If the procedure report does not answer your questions, please call your gastroenterologist to clarify.  If you requested that your care partner not be given the details of your procedure findings, then the procedure report has been included in a sealed envelope for you to review at your convenience later.  YOU SHOULD EXPECT: Some feelings of bloating in the abdomen. Passage of more gas than usual.  Walking can help get rid of the air that was put into your GI tract during the procedure and reduce the bloating. If you had a lower endoscopy (such as a colonoscopy or flexible sigmoidoscopy) you may notice spotting of blood in your stool or on the toilet paper. If you underwent a bowel prep for your procedure, you may not have a normal bowel movement for a few days.  Please Note:  You might notice some irritation and congestion in your nose or some drainage.  This is from the oxygen used during your procedure.  There is no need for concern and it should clear up in a day or so.  SYMPTOMS TO REPORT IMMEDIATELY:  Following lower endoscopy (colonoscopy or flexible sigmoidoscopy):  Excessive amounts of blood in the stool  Significant tenderness or worsening of abdominal pains  Swelling of the abdomen that is new, acute  Fever of 100F or higher   For urgent or emergent issues, a gastroenterologist can be reached at any hour by calling [phone]. Do not use MyChart  messaging for urgent concerns.    DIET:  We do recommend a small meal at first, but then you may proceed to your regular diet.  Drink plenty of fluids but you should avoid alcoholic beverages for 24 hours.  ACTIVITY:  You should plan to take it easy for the rest of today and you should NOT DRIVE or use heavy machinery until tomorrow (because of the sedation medicines used during the test).    FOLLOW UP: Our staff will call the number listed on your records 48-72 hours following your procedure to check on you and address any questions or concerns that you may have regarding the information given to you following your procedure. If we do not reach you, we will leave a message.  We will attempt to reach you two times.  During this call, we will ask if you have developed any symptoms of COVID 19. If you develop any symptoms (ie: fever, flu-like symptoms, shortness of breath, cough etc.) before then, please call [phone].  If you test positive for Covid 19 in the 2 weeks post procedure, please call and report this information to [place].    If any biopsies were taken you will be contacted by phone or by letter within the next 1-3 weeks.  Please call us at [phone] if you have not heard about the biopsies in 3 weeks.    SIGNATURES/CONFIDENTIALITY: You and/or your care partner have signed paperwork which will be entered into your electronic medical record.  These signatures  attest to the fact that that the information above on your After Visit Summary has been reviewed and is understood.  Full responsibility of the confidentiality of this discharge information lies with you and/or your care-partner.

## 2021-04-03 NOTE — Progress Notes (Signed)
GASTROENTEROLOGY PROCEDURE H&P NOTE   Primary Care Physician: Leamon Arnt, MD    Reason for Procedure:   Hx of colon polyps, family history of colon cancer  Plan:    Colonoscopy  Patient is appropriate for endoscopic procedure(s) in the ambulatory ([facility]) setting.  The nature of the procedure, as well as the risks, benefits, and alternatives were carefully and thoroughly reviewed with the patient. Ample time for discussion and questions allowed. The patient understood, was satisfied, and agreed to proceed.     HPI: Laura Chambers is a 80 y.o. female who presents for colonoscopy for ongoing polyp surveillance and family history of colon cancer (sister, diagnosed around age 36).  She is otherwise without any active GI symptoms.  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  Repeat in 5 years - 2004: Colonoscopy with diverticulosis.  Repeat in 5 years - 2008: Colonoscopy diverticulosis.  Repeat 7 years - 2015: Colonoscopy with moderate pan diverticulosis with mucosal hypertrophy and angulation.  Recommended repeat in 5 years  Past Medical History:  Diagnosis Date   Anemia    Anxiety    Arthritis    Atelectasis, left    left lower lung   Blood transfusion without reported diagnosis    Cataract cortical, senile, bilateral 56/31/4970   Complication of anesthesia 2012   "irritated trachea" with cough x 1 year from intubation 2012.  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[facility])    bilateral lower legs   Obesity    Osteoarthritis (arthritis due to wear and tear of joints)    Osteoporosis    Renal angiomyolipoma    Rheumatoid arthritis [facility])     Past Surgical History:  Procedure Laterality Date   ABDOMINAL HYSTERECTOMY  1969   BACK SURGERY  2012   rod in back-  lumbar disc removal with bone graft   blephoroplasty     bilateral   BREAST BIOPSY  1989   CATARACT EXTRACTION Bilateral    05/2020, 06/2020   CATARACT EXTRACTION, BILATERAL     CHOLECYSTECTOMY  2010   EXCISIONAL TOTAL KNEE ARTHROPLASTY WITH ANTIBIOTIC SPACERS Left 11/14/2016   Procedure: EXCISIONAL LEFT TOTAL KNEE ARTHROPLASTY WITH  PLACEMENT OF ANTIBIOTIC SPACERS;  Surgeon: [name], MD;  Location: [facility];  Service: Orthopedics;  Laterality: Left;   JOINT REPLACEMENT     NISSEN FUNDOPLICATION     ORIF FEMUR FRACTURE Right 01/04/2020   Procedure: OPEN REDUCTION INTERNAL FIXATION (ORIF) DISTAL FEMUR FRACTURE;  Surgeon: Shona Needles, MD;  Location: [place];  Service: Orthopedics;  Laterality: Right;   PARAESOPHAGEAL HERNIA REPAIR  1989   REVISION TOTAL KNEE ARTHROPLASTY Left 06/09/2017   TONSILLECTOMY  1946   TOTAL HIP ARTHROPLASTY Right 08/31/2012   Procedure: TOTAL HIP ARTHROPLASTY ANTERIOR APPROACH;  Surgeon: Mauri Pole, MD;  Location: [facility];  Service: Orthopedics;  Laterality: Right;   TOTAL KNEE ARTHROPLASTY  2001   left   TOTAL KNEE ARTHROPLASTY  2010   right   TOTAL KNEE REVISION Left 06/09/2017   Procedure: LEFT REVISION KNEE ARTHROPLASTY;  Surgeon: [name], MD;  Location: [place];  Service: Orthopedics;  Laterality: Left;    Prior to Admission medications   Medication Sig Start Date End Date Taking? Authorizing Provider  atenolol (TENORMIN) 50 MG tablet TAKE 1 TABLET BY MOUTH EVERY DAY 01/18/21  Yes Leamon Arnt, MD  Calcium  Carbonate-Vitamin D (CALCIUM 600+D) 600-400 MG-UNIT tablet Take 1 tablet by mouth daily.    Yes [provider]  FLUoxetine (PROZAC) 20 MG capsule TAKE 1 CAPSULE BY MOUTH EVERY DAY 03/12/21  Yes Leamon Arnt, MD  folic acid (FOLVITE) 962 MCG tablet Take 800 mcg daily by mouth.    Yes [provider]  gabapentin (NEURONTIN) 300 MG capsule TAKE 1 CAPSULE BY MOUTH TWICE A DAY 02/25/21  Yes Leamon Arnt, MD  Ginger, Zingiber officinalis, (GINGER ROOT PO) Take by mouth.   Yes [provider]  hydroxychloroquine (PLAQUENIL) 200 MG tablet TAKE 1 TABLET BY MOUTH EVERY DAY 02/25/21  Yes Ofilia Neas, PA-C  Multiple Vitamin (MULTIVITAMIN WITH MINERALS) TABS tablet Take 1 tablet by mouth daily.    Yes [provider]  Omega-3 1000 MG CAPS Take 1 capsule by mouth daily.    Yes [provider]  polyethylene glycol (MIRALAX / GLYCOLAX) 17 g packet Take 17 g by mouth as needed. 02/14/20  Yes Leamon Arnt, MD  rosuvastatin (CRESTOR) 10 MG tablet TAKE 1 TABLET BY MOUTH EVERY DAY 03/25/21  Yes Leamon Arnt, MD  TART CHERRY PO Take by mouth.   Yes [provider]  Turmeric (QC TUMERIC COMPLEX PO) Take by mouth.   Yes [provider]  furosemide (LASIX) 40 MG tablet Take 1 tablet (40 mg total) by mouth daily as needed. As needed for weight gain >/= 3 pounds. Notify provider if meds given. Patient not taking: Reported on 04/03/2021 01/30/20   Gerlene Fee, NP  tamsulosin (FLOMAX) 0.4 MG CAPS capsule Take 1 capsule (0.4 mg total) by mouth daily. Give after same meal each day. Do not open, crush or chew tabs. Patient taking differently: Take 0.4 mg by mouth as needed. Give after same meal each day. Do not open, crush or chew tabs. 01/30/20   Gerlene Fee, NP    Current Outpatient Medications  Medication Sig Dispense Refill   atenolol (TENORMIN) 50 MG tablet TAKE 1 TABLET BY MOUTH EVERY DAY 90 tablet 3   Calcium Carbonate-Vitamin D (CALCIUM  600+D) 600-400 MG-UNIT tablet Take 1 tablet by mouth daily.      FLUoxetine (PROZAC) 20 MG capsule TAKE 1 CAPSULE BY MOUTH EVERY DAY 90 capsule 3   folic acid (FOLVITE) 836 MCG tablet Take 800 mcg daily by mouth.      gabapentin (NEURONTIN) 300 MG capsule TAKE 1 CAPSULE BY MOUTH TWICE A DAY 180 capsule 3   Ginger, Zingiber officinalis, (GINGER ROOT PO) Take by mouth.     hydroxychloroquine (PLAQUENIL) 200 MG tablet TAKE 1 TABLET BY MOUTH EVERY DAY 90 tablet 0   Multiple Vitamin (MULTIVITAMIN WITH MINERALS) TABS tablet Take 1 tablet by mouth daily.      Omega-3 1000 MG CAPS Take 1 capsule by mouth daily.      polyethylene glycol (MIRALAX / GLYCOLAX) 17 g packet Take 17 g by mouth  as needed.     rosuvastatin (CRESTOR) 10 MG tablet TAKE 1 TABLET BY MOUTH EVERY DAY 90 tablet 3   TART CHERRY PO Take by mouth.     Turmeric (QC TUMERIC COMPLEX PO) Take by mouth.     furosemide (LASIX) 40 MG tablet Take 1 tablet (40 mg total) by mouth daily as needed. As needed for weight gain >/= 3 pounds. Notify provider if meds given. (Patient not taking: Reported on 04/03/2021) 30 tablet 0   tamsulosin (FLOMAX) 0.4 MG CAPS capsule Take 1 capsule (0.4 mg total) by mouth daily. Give after same meal each day. Do not open, crush or chew tabs. (Patient taking differently: Take 0.4 mg by mouth as needed. Give after same meal each day. Do not open, crush or chew tabs.) 30 capsule 0   Current Facility-Administered Medications  Medication Dose Route Frequency Provider Last Rate Last Admin   0.9 %  sodium chloride infusion  500 mL Intravenous Once Laura Chambers V, DO        Allergies as of 04/03/2021 - Review Complete 04/03/2021  Allergen Reaction Noted   Ativan [lorazepam] Other (See Comments) 01/27/2017   Haldol [haloperidol] Other (See Comments) 01/27/2017   Epinephrine Other (See Comments) 02/04/2012   Levofloxacin Nausea And Vomiting 11/11/2016   Lipitor [atorvastatin] Other (See Comments) 04/08/2013   Robaxin  [methocarbamol] Other (See Comments) 02/14/2020   Trovan [alatrofloxacin] Nausea And Vomiting 02/04/2012    Family History  Problem Relation Age of Onset   Diabetes Father    Osteoarthritis Father    Heart disease Father    Stroke Father    Osteoarthritis Mother    Stroke Mother    Pneumonia Mother    Colon cancer Sister 4   Colon polyps Other        neice/nephew   Diabetes Sister    Diabetes Brother     Social History   Socioeconomic History   Marital status: Divorced    Spouse name: Not on file   Number of children: 3   Years of education: College   Highest education level: Not on file  Occupational History    Employer: [employer]    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   Caffeine Use: 1 cup daily   Social Determinants of Health   Financial Resource Strain: Low Risk    Difficulty of Paying Living Expenses: Not hard at all  Food Insecurity: No Food Insecurity   Worried About Charity fundraiser in the Last Year: Never true   Ran Out of Food in the Last Year: Never true  Transportation Needs: No Transportation Needs   Lack of Transportation (Medical): No   Lack of Transportation (Non-Medical): No  Physical Activity: Sufficiently Active   Days of Exercise per Week: 5 days   Minutes of Exercise per Session: 60 min  Stress: No Stress Concern Present   Feeling of Stress : Not at all  Social Connections: Moderately Isolated   Frequency of Communication with Friends and Family: More than three times a week   Frequency of Social Gatherings with Friends and Family: Once a week   Attends Religious  Services: 1 to 4 times per year   Active Member of Clubs or Organizations: No   Attends English as a second language teacher Meetings: Never   Marital Status: Divorced  Human resources officer Violence: Not At Risk   Fear of Current or Ex-Partner: No   Emotionally Abused: No   Physically Abused: No   Sexually Abused: No    Physical Exam: Vital signs in last 24 hours: @BP  (!) 179/77   Pulse 68   Temp 97.8 F (36.6 C)   Ht 5\' 6"  (1.676 m)   Wt 204 lb (92.5 kg)   SpO2 98%   BMI 32.93 kg/m  GEN: NAD EYE: Sclerae anicteric ENT: MMM CV: Non-tachycardic Pulm: CTA b/l GI: Soft, NT/ND NEURO:  Alert & Oriented x 3   Gerrit Heck, DO [facility]   04/03/2021 1:00 PM

## 2021-04-05 ENCOUNTER — Telehealth: Payer: Self-pay | Admitting: *Deleted

## 2021-04-05 NOTE — Telephone Encounter (Signed)
  Follow up Call-  Call back number 04/03/2021  Post procedure Call Back phone  # [phone]  Permission to leave phone message Yes  Some recent data might be hidden     Patient questions:   Message left to call if necessary.

## 2021-04-09 ENCOUNTER — Telehealth: Payer: Self-pay

## 2021-04-09 ENCOUNTER — Other Ambulatory Visit: Payer: Self-pay

## 2021-04-09 MED ORDER — GABAPENTIN 300 MG PO CAPS: 300.0000 mg | ORAL_CAPSULE | Freq: Two times a day (BID) | ORAL | 3 refills | Status: DC

## 2021-04-09 NOTE — Telephone Encounter (Signed)
Medication has been filled 

## 2021-04-09 NOTE — Telephone Encounter (Signed)
LAST APPOINTMENT DATE:  01/15/21  NEXT APPOINTMENT DATE: 07/24/21  MEDICATION: gabapentin (NEURONTIN) 300 MG capsule  PHARMACY:  [id number] - [place], [place] - [address]

## 2021-04-11 ENCOUNTER — Encounter: Payer: Self-pay | Admitting: Gastroenterology

## 2021-04-18 ENCOUNTER — Telehealth: Payer: Self-pay

## 2021-04-18 DIAGNOSIS — F329 Major depressive disorder, single episode, unspecified: Secondary | ICD-10-CM

## 2021-04-18 MED ORDER — FLUOXETINE HCL 20 MG PO CAPS: ORAL_CAPSULE | ORAL | 3 refills | Status: DC

## 2021-04-18 NOTE — Telephone Encounter (Signed)
Medication has been filled 

## 2021-04-18 NOTE — Telephone Encounter (Signed)
  Encourage patient to contact the pharmacy for refills or they can request refills through [facility]:  01/15/21  NEXT APPOINTMENT DATE: 07/22/2021  MEDICATION: FLUoxetine (PROZAC) 20 MG capsule  Is the patient out of medication? no  PHARMACY: [id number] - [place], [place] - [address]  Let patient know to contact pharmacy at the end of the day to make sure medication is ready.  Please notify patient to allow 48-72 hours to process.

## 2021-05-12 ENCOUNTER — Other Ambulatory Visit: Payer: Self-pay | Admitting: Physician Assistant

## 2021-05-13 NOTE — Telephone Encounter (Signed)
Next Visit: 07/09/2021  Last Visit: 12/31/2020  Labs: 12/31/2020 CBC is normal.  GFR is low and stable.  Eye exam: 12/12/2020 WNL    Current Dose per office note 12/31/2020: plaquenil 200 mg 1 tablet by mouth daily  MS:[id number] arthritis involving multiple sites with positive rheumatoid factor   Last Fill: 02/25/2021   Okay to refill Plaquenil?

## 2021-06-14 DIAGNOSIS — H532 Diplopia: Secondary | ICD-10-CM | POA: Diagnosis not present

## 2021-06-14 DIAGNOSIS — H4922 Sixth [abducent] nerve palsy, left eye: Secondary | ICD-10-CM | POA: Diagnosis not present

## 2021-06-17 ENCOUNTER — Other Ambulatory Visit: Payer: Self-pay | Admitting: Ophthalmology

## 2021-06-17 DIAGNOSIS — H4920 Sixth [abducent] nerve palsy, unspecified eye: Secondary | ICD-10-CM

## 2021-06-18 ENCOUNTER — Other Ambulatory Visit: Payer: Self-pay | Admitting: Physician Assistant

## 2021-06-19 ENCOUNTER — Ambulatory Visit
Admission: RE | Admit: 2021-06-19 | Discharge: 2021-06-19 | Disposition: A | Discharge status: Home / Self Care | Payer: PPO | Source: Ambulatory Visit | Attending: Ophthalmology | Admitting: Ophthalmology

## 2021-06-19 ENCOUNTER — Other Ambulatory Visit: Payer: Self-pay

## 2021-06-19 DIAGNOSIS — H532 Diplopia: Secondary | ICD-10-CM | POA: Diagnosis not present

## 2021-06-19 DIAGNOSIS — H4920 Sixth [abducent] nerve palsy, unspecified eye: Secondary | ICD-10-CM

## 2021-06-19 DIAGNOSIS — S0990XA Unspecified injury of head, initial encounter: Secondary | ICD-10-CM | POA: Diagnosis not present

## 2021-06-24 ENCOUNTER — Encounter: Payer: Self-pay | Admitting: Family Medicine

## 2021-06-24 DIAGNOSIS — M810 Age-related osteoporosis without current pathological fracture: Secondary | ICD-10-CM | POA: Insufficient documentation

## 2021-06-25 ENCOUNTER — Telehealth: Payer: Self-pay

## 2021-06-25 NOTE — Telephone Encounter (Signed)
Patient is scheduled for tomorrow at 4:15pm.    States a mass was found in her head and would like to discuss with Dr. Jonni Sanger before proceeding.   Would like to know if Dr. Jonni Sanger could work her in for a virtual visit earlier in the day tomorrow.

## 2021-06-25 NOTE — Telephone Encounter (Signed)
Reschedule her 11:00 tomorrow to next week, move this patient to the 11:00 slot, for virtual.

## 2021-06-25 NOTE — Telephone Encounter (Signed)
Patient has been r/s to 11am.

## 2021-06-25 NOTE — Progress Notes (Deleted)
Office Visit Note  Patient: Laura Chambers             Date of Birth: [birth year]           MRN: [id number]             PCP: Leamon Arnt, MD Referring: Leamon Arnt, MD Visit Date: 07/09/2021 Occupation: @GUAROCC @  Subjective:  No chief complaint on file.   History of Present Illness: Laura Chambers is a 80 y.o. female ***   Activities of Daily Living:  Patient reports morning stiffness for *** {minute/hour:19697}.   Patient {ACTIONS;DENIES/REPORTS:21021675::"Denies"} nocturnal pain.  Difficulty dressing/grooming: {ACTIONS;DENIES/REPORTS:21021675::"Denies"} Difficulty climbing stairs: {ACTIONS;DENIES/REPORTS:21021675::"Denies"} Difficulty getting out of chair: {ACTIONS;DENIES/REPORTS:21021675::"Denies"} Difficulty using hands for taps, buttons, cutlery, and/or writing: {ACTIONS;DENIES/REPORTS:21021675::"Denies"}  No Rheumatology ROS completed.   PMFS History:  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   Anemia associated with chronic renal failure 05/16/2020   Chronic constipation 01/12/2020   History of femur fracture supracondylar 2021, right 01/03/2020   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    Rheumatoid arthritis involving multiple sites with positive rheumatoid factor ([place]) 10/13/2018   Hx of colonic polyps 12/15/2017   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   Chronic kidney disease (CKD), active medical management without dialysis, stage 3 (moderate) 12/15/2017   Fibromyalgia 12/15/2017   History of total right hip replacement 12/15/2017   DDD (degenerative disc disease), lumbar 12/15/2017   S/P total knee replacement, right 12/15/2017   Left foot drop 12/15/2017   Reactive depression (situational) 08/17/2017   Status post revision of total knee, left 06/09/2017   Essential hypertension 12/08/2016   Urine retention 12/02/2016   Peripheral neuropathy 01/12/2014   Benign  hypertensive heart disease without heart failure 06/16/2012   Dyslipidemia 06/16/2012    Past Medical History:  Diagnosis Date   Anemia    Anxiety    Arthritis    Atelectasis, left    left lower lung   Blood transfusion without reported diagnosis    Cataract cortical, senile, bilateral 36/14/4315   Complication of anesthesia 2012   "irritated trachea" with cough x 1 year from intubation 2012.  smaller tube used with other surgeries   Depression    just lost her son a month ago   Esophageal hernia    Fibromyalgia    Headache    occasionally   HTN (hypertension)    Hx of colonic polyps    Hydronephrosis of right kidney    Hypercholesterolemia    Hypertensive retinopathy of both eyes 03/19/2018   Neuropathy due to medical condition ([place])    bilateral lower legs   Obesity    Osteoarthritis (arthritis due to wear and tear of joints)    Osteoporosis    Renal angiomyolipoma    Rheumatoid arthritis ([place])     Family History  Problem Relation Age of Onset   Diabetes Father    Osteoarthritis Father    Heart disease Father    Stroke Father    Osteoarthritis Mother    Stroke Mother    Pneumonia Mother    Colon cancer Sister 97   Colon polyps Other        neice/nephew   Diabetes Sister    Diabetes Brother    Past Surgical History:  Procedure Laterality Date   ABDOMINAL HYSTERECTOMY  1969   BACK SURGERY  2012   rod in back-  lumbar disc removal with bone graft   blephoroplasty  bilateral   BREAST BIOPSY  1989   CATARACT EXTRACTION Bilateral    05/2020, 06/2020   CATARACT EXTRACTION, BILATERAL     CHOLECYSTECTOMY  2010   EXCISIONAL TOTAL KNEE ARTHROPLASTY WITH ANTIBIOTIC SPACERS Left 11/14/2016   Procedure: EXCISIONAL LEFT TOTAL KNEE ARTHROPLASTY WITH  PLACEMENT OF ANTIBIOTIC SPACERS;  Surgeon: [name], MD;  Location: [facility];  Service: Orthopedics;  Laterality: Left;   JOINT REPLACEMENT     NISSEN FUNDOPLICATION     ORIF FEMUR FRACTURE Right  01/04/2020   Procedure: OPEN REDUCTION INTERNAL FIXATION (ORIF) DISTAL FEMUR FRACTURE;  Surgeon: Shona Needles, MD;  Location: [place];  Service: Orthopedics;  Laterality: Right;   PARAESOPHAGEAL HERNIA REPAIR  1989   REVISION TOTAL KNEE ARTHROPLASTY Left 06/09/2017   TONSILLECTOMY  1946   TOTAL HIP ARTHROPLASTY Right 08/31/2012   Procedure: TOTAL HIP ARTHROPLASTY ANTERIOR APPROACH;  Surgeon: Mauri Pole, MD;  Location[facility];  Service: Orthopedics;  Laterality: Right;   TOTAL KNEE ARTHROPLASTY  2001   left   TOTAL KNEE ARTHROPLASTY  2010   right   TOTAL KNEE REVISION Left 06/09/2017   Procedure: LEFT REVISION KNEE ARTHROPLASTY;  Surgeon: [name], MD;  Location: [place];  Service: Orthopedics;  Laterality: Left;   Social History   Social History Narrative   Patient lives at home alone.   Caffeine Use: 1 cup daily   Immunization History  Administered Date(s) Administered   Fluad Quad(high Dose 65+) 04/26/2019, 05/08/2020   Influenza, High Dose Seasonal PF 03/27/2018   Influenza,inj,Quad PF,6+ Mos 03/13/2017   Influenza-Unspecified 03/26/2018, 06/04/2020   PFIZER(Purple Top)SARS-COV-2 Vaccination 09/02/2019, 09/30/2019, 05/08/2020   Pneumococcal Conjugate-13 02/26/2018   Pneumococcal Polysaccharide-23 05/17/2019     Objective: Vital Signs: There were no vitals taken for this visit.   Physical Exam   Musculoskeletal Exam: ***  CDAI Exam: CDAI Score: -- Patient Global: --; Provider Global: -- Swollen: --; Tender: -- Joint Exam 07/09/2021   No joint exam has been documented for this visit   There is currently no information documented on the homunculus. Go to the Rheumatology activity and complete the homunculus joint exam.  Investigation: No additional findings.  Imaging: CT HEAD WO CONTRAST (5MM)  Result Date: 06/20/2021 CLINICAL DATA:  Head injury 8 weeks ago due to a fall. Abducent nerve palsy. Diplopia. EXAM: CT HEAD WITHOUT CONTRAST TECHNIQUE:  Contiguous axial images were obtained from the base of the skull through the vertex without intravenous contrast. COMPARISON:  Head CT 12/26/2016 FINDINGS: Brain: There is no evidence of an acute infarct, intracranial hemorrhage, midline shift, or extra-axial fluid collection. There is mild cerebral atrophy. Patchy hypodensities in the cerebral white matter bilaterally are nonspecific but compatible with moderate chronic small vessel ischemic disease. More focal areas of subcortical hypoattenuation in the occipital, parietal, and posterior frontal lobes on the prior CT have resolved. There is a new 1 cm extra-axial soft tissue mass in the prepontine cistern located near the midline which is associated with chronic erosion of the underlying upper clivus. Vascular: Calcified atherosclerosis at the skull base. Skull: Abnormality of the clivus as noted above. No other suspicious skull lesion. Sinuses/Orbits: Paranasal sinuses and mastoid air cells are clear. Bilateral cataract extraction. Other: None. IMPRESSION: 1. 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 Brain MRI without and with contrast is recommended for further evaluation. 2. No evidence of acute infarct or intracranial hemorrhage. 3. Moderate chronic small  vessel ischemic disease. These results will be called to the ordering clinician or representative by the Radiologist Assistant, and communication documented in the PACS or [employer]. Electronically Signed   By: Logan Bores M.D.   On: 06/20/2021 16:28    Recent Labs: Lab Results  Component Value Date   WBC 9.2 12/31/2020   HGB 13.9 12/31/2020   PLT 231 12/31/2020   NA 141 12/31/2020   K 4.9 12/31/2020   CL 103 12/31/2020   CO2 30 12/31/2020   GLUCOSE 81 12/31/2020   BUN 24 12/31/2020   CREATININE 1.29 (H) 12/31/2020   BILITOT 1.0 12/31/2020   ALKPHOS 93 02/26/2018   AST 26 12/31/2020   ALT 17 12/31/2020   PROT 6.8  12/31/2020   ALBUMIN 4.0 02/26/2018   CALCIUM 9.4 12/31/2020   GFRAA 45 (L) 12/31/2020    Speciality Comments: PLQ Eye Exam:12/9/2022WNL @ [facility] Exam    Procedures:  No procedures performed Allergies: Ativan [lorazepam], Haldol [haloperidol], Epinephrine, Levofloxacin, Lipitor [atorvastatin], Robaxin [methocarbamol], and Trovan [alatrofloxacin]   Assessment / Plan:     Visit Diagnoses: No diagnosis found.  Orders: No orders of the defined types were placed in this encounter.  No orders of the defined types were placed in this encounter.   Face-to-face time spent with patient was *** minutes. Greater than 50% of time was spent in counseling and coordination of care.  Follow-Up Instructions: No follow-ups on file.   Earnestine Mealing, CMA  Note - This record has been created using Editor, commissioning.  Chart creation errors have been sought, but may not always  have been located. Such creation errors do not reflect on  the standard of medical care.

## 2021-06-26 ENCOUNTER — Telehealth: Payer: PPO | Admitting: Family Medicine

## 2021-06-26 ENCOUNTER — Other Ambulatory Visit: Payer: Self-pay

## 2021-06-26 NOTE — Telephone Encounter (Signed)
Patient appt has been updated.

## 2021-06-26 NOTE — Telephone Encounter (Signed)
Patient called in stating [facility] had called her during the time of the call with Dr. Jonni Sanger.    Wanted to apologize.   States she is going to go for a consult at [facility].  Is requesting call back.

## 2021-06-26 NOTE — Telephone Encounter (Signed)
Please remove patient from todays schedule, she will update Dr.Andy after she sees [facility]

## 2021-07-09 ENCOUNTER — Ambulatory Visit: Payer: PPO | Admitting: Rheumatology

## 2021-07-09 NOTE — Progress Notes (Signed)
Office Visit Note  Patient: Laura Chambers             Date of Birth: 1940-11-01           MRN: [id number]             PCP: Leamon Arnt, MD Referring: Leamon Arnt, MD Visit Date: 07/22/2021 Occupation: @GUAROCC @  Subjective:  Medication monitoring   History of Present Illness: Laura Chambers is a 81 y.o. female with history of seropositive rheumatoid arthritis and DDD.  She is taking plaquenil 200 mg 1 tablet by mouth daily. She has not missed any doses of plaquenil recently.  She continues to tolerate Plaquenil without any side effects.  She denies any signs or symptoms of a rheumatoid arthritis flare.  She has occasional pain and stiffness in the left fourth finger.  She has some difficulty with fine motor skills such as writing and picking up small objects.  Overall her rheumatoid arthritis has been well controlled on the current treatment regimen. She states that since her last office visit she has had more frequent falls.  She recently had an episode of double vision and was evaluated by her ophthalmologist Dr. Lucianne Lei.  She underwent a head CT on 06/19/2021.  A small mass was noted so she is scheduled for an MRI with and without contrast of the brain.  She also has an upcoming appointment with Dr. Redmond Pulling at [facility] neurosurgery for further evaluation and management. She continues to have difficulty ambulating due to the concern for falling as well as dizziness.     Activities of Daily Living:  Patient reports morning stiffness for 10 minutes.   Patient Denies nocturnal pain.  Difficulty dressing/grooming: Reports Difficulty climbing stairs: Reports Difficulty getting out of chair: Reports Difficulty using hands for taps, buttons, cutlery, and/or writing: Reports  Review of Systems  Constitutional:  Negative for fatigue.  HENT:  Positive for mouth dryness and nose dryness. Negative for mouth sores.   Eyes:  Negative for pain, itching and dryness.  Respiratory:   Negative for shortness of breath and difficulty breathing.   Cardiovascular:  Negative for chest pain and palpitations.  Gastrointestinal:  Negative for blood in stool, constipation and diarrhea.  Endocrine: Negative for increased urination.  Genitourinary:  Negative for difficulty urinating.  Musculoskeletal:  Positive for joint pain, joint pain and morning stiffness. Negative for joint swelling, myalgias, muscle tenderness and myalgias.  Skin:  Negative for color change, rash and redness.  Allergic/Immunologic: Negative for susceptible to infections.  Neurological:  Positive for dizziness, numbness, headaches and weakness. Negative for memory loss.  Hematological:  Negative for bruising/bleeding tendency.  Psychiatric/Behavioral:  Negative for confusion.    PMFS History:  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   Anemia associated with chronic renal failure 05/16/2020   Chronic constipation 01/12/2020   History of femur fracture supracondylar 2021, right 01/03/2020   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    Rheumatoid arthritis involving multiple sites with positive rheumatoid factor ([place]) 10/13/2018   Hx of colonic polyps 12/15/2017   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   Chronic kidney disease (CKD), active medical management without dialysis, stage 3 (moderate) 12/15/2017   Fibromyalgia 12/15/2017   History of total right hip replacement 12/15/2017   DDD (degenerative disc disease), lumbar 12/15/2017   S/P total knee replacement, right 12/15/2017   Left foot drop 12/15/2017   Reactive depression (situational) 08/17/2017  Status post revision of total knee, left 06/09/2017   Essential hypertension 12/08/2016   Urine retention 12/02/2016   Peripheral neuropathy 01/12/2014   Benign hypertensive heart disease without heart failure 06/16/2012   Dyslipidemia 06/16/2012    Past Medical History:   Diagnosis Date   Anemia    Anxiety    Arthritis    Atelectasis, left    left lower lung   Blood transfusion without reported diagnosis    Cataract cortical, senile, bilateral 60/73/7106   Complication of anesthesia 2012   "irritated trachea" with cough x 1 year from intubation 2012.  smaller tube used with other surgeries   Depression    just lost her son a month ago   Esophageal hernia    Fibromyalgia    Headache    occasionally   HTN (hypertension)    Hx of colonic polyps    Hydronephrosis of right kidney    Hypercholesterolemia    Hypertensive retinopathy of both eyes 03/19/2018   Neuropathy due to medical condition ([place])    bilateral lower legs   Obesity    Osteoarthritis (arthritis due to wear and tear of joints)    Osteoporosis    Renal angiomyolipoma    Rheumatoid arthritis ([place])     Family History  Problem Relation Age of Onset   Diabetes Father    Osteoarthritis Father    Heart disease Father    Stroke Father    Osteoarthritis Mother    Stroke Mother    Pneumonia Mother    Colon cancer Sister 37   Colon polyps Other        neice/nephew   Diabetes Sister    Diabetes Brother    Past Surgical History:  Procedure Laterality Date   ABDOMINAL HYSTERECTOMY  1969   BACK SURGERY  2012   rod in back-  lumbar disc removal with bone graft   blephoroplasty     bilateral   BREAST BIOPSY  1989   CATARACT EXTRACTION Bilateral    05/2020, 06/2020   CATARACT EXTRACTION, BILATERAL     CHOLECYSTECTOMY  2010   COLONOSCOPY  2022   with polypectomy   EXCISIONAL TOTAL KNEE ARTHROPLASTY WITH ANTIBIOTIC SPACERS Left 11/14/2016   Procedure: EXCISIONAL LEFT TOTAL KNEE ARTHROPLASTY WITH  PLACEMENT OF ANTIBIOTIC SPACERS;  Surgeon: [name], MD;  [facility];  Service: Orthopedics;  Laterality: Left;   JOINT REPLACEMENT     NISSEN FUNDOPLICATION     ORIF FEMUR FRACTURE Right 01/04/2020   Procedure: OPEN REDUCTION INTERNAL FIXATION (ORIF) DISTAL FEMUR  FRACTURE;  Surgeon: Shona Needles, MD;  Location: [place];  Service: Orthopedics;  Laterality: Right;   PARAESOPHAGEAL HERNIA REPAIR  1989   REVISION TOTAL KNEE ARTHROPLASTY Left 06/09/2017   TONSILLECTOMY  1946   TOTAL HIP ARTHROPLASTY Right 08/31/2012   Procedure: TOTAL HIP ARTHROPLASTY ANTERIOR APPROACH;  Surgeon: Mauri Pole, MD;  [facility];  Service: Orthopedics;  Laterality: Right;   TOTAL KNEE ARTHROPLASTY  2001   left   TOTAL KNEE ARTHROPLASTY  2010   right   TOTAL KNEE REVISION Left 06/09/2017   Procedure: LEFT REVISION KNEE ARTHROPLASTY;  Surgeon: [name], MD;  Location: [place];  Service: Orthopedics;  Laterality: Left;   Social History   Social History Narrative   Patient lives at home alone.   Caffeine Use: 1 cup daily   Immunization History  Administered Date(s) Administered   Fluad Quad(high Dose 65+) 04/26/2019, 05/08/2020   Influenza, High  Dose Seasonal PF 03/27/2018   Influenza,inj,Quad PF,6+ Mos 03/13/2017   Influenza-Unspecified 03/26/2018, 06/04/2020, 07/18/2021   PFIZER(Purple Top)SARS-COV-2 Vaccination 09/02/2019, 09/30/2019, 05/08/2020   Pneumococcal Conjugate-13 02/26/2018   Pneumococcal Polysaccharide-23 05/17/2019     Objective: Vital Signs: BP (!) 166/78 (BP Location: Left Arm, Patient Position: Sitting, Cuff Size: Normal)    Pulse 68    Ht 5\' 6"  (1.676 m)    Wt 206 lb (93.4 kg)    BMI 33.25 kg/m    Physical Exam Vitals and nursing note reviewed.  Constitutional:      Appearance: She is well-developed.  HENT:     Head: Normocephalic and atraumatic.  Eyes:     Conjunctiva/sclera: Conjunctivae normal.  Pulmonary:     Effort: Pulmonary effort is normal.  Abdominal:     Palpations: Abdomen is soft.  Musculoskeletal:     Cervical back: Normal range of motion.  Skin:    General: Skin is warm and dry.     Capillary Refill: Capillary refill takes less than 2 seconds.  Neurological:     Mental Status: She is alert and  oriented to person, place, and time.  Psychiatric:        Behavior: Behavior normal.     Musculoskeletal Exam: Patient remained seated during examination.  C-spine has good range of motion with no discomfort at this time.  No midline spinal tenderness in the thoracic region.  Some tenderness over the right SI joint noted.  Shoulder joints, elbow joints, wrist joints, MCPs, PIPs, DIPs have good range of motion with no synovitis.  She has PIP and DIP thickening consistent with osteoarthritis of both hands.  Subluxation of the right first MCP joint noted.  CMC joint prominence noted bilaterally.  Hip joints difficult to assess in seated position.  Right knee replacement has painful range of motion with warmth.  Left knee replacement has good ROM with no discomfort.  Ankle joints have good ROM with no tenderness or joint swelling.  Pedal edema noted bilaterally.   CDAI Exam: CDAI Score: 0.5  Patient Global: 4 mm; Provider Global: 1 mm Swollen: 0 ; Tender: 0  Joint Exam 07/22/2021   No joint exam has been documented for this visit   There is currently no information documented on the homunculus. Go to the Rheumatology activity and complete the homunculus joint exam.  Investigation: No additional findings.  Imaging: No results found.  Recent Labs: Lab Results  Component Value Date   WBC 9.2 12/31/2020   HGB 13.9 12/31/2020   PLT 231 12/31/2020   NA 141 12/31/2020   K 4.9 12/31/2020   CL 103 12/31/2020   CO2 30 12/31/2020   GLUCOSE 81 12/31/2020   BUN 24 12/31/2020   CREATININE 1.29 (H) 12/31/2020   BILITOT 1.0 12/31/2020   ALKPHOS 93 02/26/2018   AST 26 12/31/2020   ALT 17 12/31/2020   PROT 6.8 12/31/2020   ALBUMIN 4.0 02/26/2018   CALCIUM 9.4 12/31/2020   GFRAA 45 (L) 12/31/2020    Speciality Comments: PLQ Eye Exam:12/9/2022WNL @ [facility] Exam  f/u 1 year  Procedures:  No procedures performed Allergies: Ativan [lorazepam], Haldol [haloperidol], Epinephrine,  Levofloxacin, Lipitor [atorvastatin], Robaxin [methocarbamol], and Trovan [alatrofloxacin]   Assessment / Plan:     Visit Diagnoses: Rheumatoid arthritis involving multiple sites with positive rheumatoid factor (HCC) - Severe rheumatoid arthritis. She has severe intercarpal radiocarpal joint space narrowing and subtalar joint narrowing on x-rays updated on 12/21/19: She has no joint tenderness or synovitis on  examination today.  She has not had any signs or symptoms of a rheumatoid arthritis flare since prior to her last office visit.  She has clinically been doing well taking Plaquenil 200 mg 1 tablet by mouth daily.  She continues to tolerate Plaquenil without any side effects and has not missed any doses recently.  She experiences occasional discomfort and stiffness in her hands but has no inflammation on examination.  She has some difficulty with fine motor skills and grip strength.  Discussed the importance of joint protection and muscle strengthening.  She will remain on Plaquenil as prescribed.  She was advised to notify us if she develops increased joint pain or joint swelling.  She will follow-up in the office in 6 months.   High risk medication use - Plaquenil 200 mg 1 tablet by mouth daily.  CBC and CMP updated on 12/31/20.  She is due to update lab work.  She has an upcoming appointment with her PCP and will have lab work drawn at that time.  She plans on having results faxed to our office to review.  Discussed the importance of continuing to have lab monitoring at least every 5 months to monitor for drug toxicity.  PLQ Eye Exam:06/14/2021 WNL @ [facility] Exam f/u 1 year.   Displaced supracondylar fracture without intracondylar extension of lower end of right femur, subsequent encounter for closed fracture with routine healing: Followed by Dr. Doreatha Martin.   History of total right hip replacement - Followed by Dr. Doreatha Martin after right femur fracture in June 2021.    S/P total knee replacement, right -  She fell in June 2021 and fractured her right femur. Followed by Dr. Doreatha Martin.   She has painful ROM of the right knee with warmth but no effusion noted.   Status post revision of total knee, left - Revision 2018 by Dr. Ninfa Linden.  She has residual left foot drop. Doing well.  She has good ROM with no discomfort at this time.  She is using a walker to assist with ambulation.   Infected prosthetic knee joint, sequela  DDD (degenerative disc disease), lumbar: She continues to experience intermittent discomfort in her lower back.   Left foot drop: She uses a walker to assist with ambulation.   Osteoporosis screening - DEXA ordered by PCP several years ago according to the patient.  She has not had an updated DEXA since her right femur fracture in June.  Patient declined DEXA.  Fibromyalgia: She experiences intermittent myalgias and muscle tenderness due to fibromyalgia.  Brain mass: In December 2022 the patient experienced an episode of diplopia and was evaluated by her ophthalmologist Dr. Herbert Deaner urgently.  She was scheduled for a CT of the head on 06/19/2021 which revealed a new 1 cm extra axial soft tissue mass in the prepontine cistern with chronic erosion of the underlying clivus.  She has an upcoming MRI of the brain with and without contrast for further evaluation.  She also has an upcoming appointment with Dr. Redmond Pulling who is a neurosurgeon at [facility].   She has been experiencing intermittent dizziness and shooting pains in her head.  She has been wearing an eye patch on a daily basis and using a walker to assist with ambulation.  Other medical conditions are listed as follows:   Dyslipidemia  Renal angiomyolipoma  Chronic kidney disease (CKD), active medical management without dialysis, stage 3 (moderate) ([place])  Essential hypertension  History of depression  History of anxiety  Hx  of colonic polyps  Hx of peripheral neuropathy  Orders: No orders of the  defined types were placed in this encounter.  No orders of the defined types were placed in this encounter.    Follow-Up Instructions: Return in about 6 months (around 01/19/2022) for Rheumatoid arthritis, DDD.   Ofilia Neas, PA-C  Note - This record has been created using Dragon software.  Chart creation errors have been sought, but may not always  have been located. Such creation errors do not reflect on  the standard of medical care.

## 2021-07-22 ENCOUNTER — Encounter: Payer: Self-pay | Admitting: Physician Assistant

## 2021-07-22 ENCOUNTER — Other Ambulatory Visit: Payer: Self-pay

## 2021-07-22 ENCOUNTER — Ambulatory Visit: Payer: PPO | Admitting: Physician Assistant

## 2021-07-22 ENCOUNTER — Ambulatory Visit: Payer: PPO

## 2021-07-22 VITALS — BP 166/78 | HR 68 | Ht 66.0 in | Wt 206.0 lb

## 2021-07-22 DIAGNOSIS — Z96651 Presence of right artificial knee joint: Secondary | ICD-10-CM | POA: Diagnosis not present

## 2021-07-22 DIAGNOSIS — E785 Hyperlipidemia, unspecified: Secondary | ICD-10-CM

## 2021-07-22 DIAGNOSIS — Z8659 Personal history of other mental and behavioral disorders: Secondary | ICD-10-CM

## 2021-07-22 DIAGNOSIS — T8459XS Infection and inflammatory reaction due to other internal joint prosthesis, sequela: Secondary | ICD-10-CM | POA: Diagnosis not present

## 2021-07-22 DIAGNOSIS — Z96641 Presence of right artificial hip joint: Secondary | ICD-10-CM

## 2021-07-22 DIAGNOSIS — M21372 Foot drop, left foot: Secondary | ICD-10-CM

## 2021-07-22 DIAGNOSIS — Z8669 Personal history of other diseases of the nervous system and sense organs: Secondary | ICD-10-CM

## 2021-07-22 DIAGNOSIS — S72451D Displaced supracondylar fracture without intracondylar extension of lower end of right femur, subsequent encounter for closed fracture with routine healing: Secondary | ICD-10-CM

## 2021-07-22 DIAGNOSIS — M0579 Rheumatoid arthritis with rheumatoid factor of multiple sites without organ or systems involvement: Secondary | ICD-10-CM | POA: Diagnosis not present

## 2021-07-22 DIAGNOSIS — M797 Fibromyalgia: Secondary | ICD-10-CM

## 2021-07-22 DIAGNOSIS — Z96652 Presence of left artificial knee joint: Secondary | ICD-10-CM

## 2021-07-22 DIAGNOSIS — I10 Essential (primary) hypertension: Secondary | ICD-10-CM

## 2021-07-22 DIAGNOSIS — M5136 Other intervertebral disc degeneration, lumbar region: Secondary | ICD-10-CM

## 2021-07-22 DIAGNOSIS — Z13820 Encounter for screening for osteoporosis: Secondary | ICD-10-CM

## 2021-07-22 DIAGNOSIS — Z86010 Personal history of colonic polyps: Secondary | ICD-10-CM

## 2021-07-22 DIAGNOSIS — N183 Chronic kidney disease, stage 3 unspecified: Secondary | ICD-10-CM

## 2021-07-22 DIAGNOSIS — D1771 Benign lipomatous neoplasm of kidney: Secondary | ICD-10-CM

## 2021-07-22 DIAGNOSIS — Z79899 Other long term (current) drug therapy: Secondary | ICD-10-CM | POA: Diagnosis not present

## 2021-07-22 DIAGNOSIS — Z96659 Presence of unspecified artificial knee joint: Secondary | ICD-10-CM

## 2021-07-22 DIAGNOSIS — G9389 Other specified disorders of brain: Secondary | ICD-10-CM

## 2021-07-22 NOTE — Patient Instructions (Signed)
Standing Labs We placed an order today for your standing lab work.   Please have your standing labs drawn in January and every 5 months    CBC with diff and CMP with GFR   If possible, please have your labs drawn 2 weeks prior to your appointment so that the provider can discuss your results at your appointment.  Please note that you may see your imaging and lab results in [place] before we have reviewed them. We may be awaiting multiple results to interpret others before contacting you. Please allow our office up to 72 hours to thoroughly review all of the results before contacting the office for clarification of your results.  We have open lab daily: Monday through Thursday from 1:30-4:30 PM and Friday from 1:30-4:00 PM at the office of Dr. Bo Merino, [facility].   Please be advised, all patients with office appointments requiring lab work will take precedent over walk-in lab work.  If possible, please come for your lab work on Monday and Friday afternoons, as you may experience shorter wait times. The office is located at [address], [place], [place],  [address] No appointment is necessary.   Labs are drawn by [facility]. Please bring your co-pay at the time of your lab draw.  You may receive a bill from [facility] for your lab work.  Please note if you are on Hydroxychloroquine and and an order has been placed for a Hydroxychloroquine level, you will need to have it drawn 4 hours or more after your last dose.  If you wish to have your labs drawn at another location, please call the office 24 hours in advance to send orders.  If you have any questions regarding directions or hours of operation,  please call [phone].   As a reminder, please drink plenty of water prior to coming for your lab work. Thanks!

## 2021-07-24 ENCOUNTER — Ambulatory Visit: Payer: PPO | Admitting: Family Medicine

## 2021-07-26 DIAGNOSIS — G9389 Other specified disorders of brain: Secondary | ICD-10-CM | POA: Diagnosis not present

## 2021-08-01 ENCOUNTER — Telehealth: Payer: Self-pay

## 2021-08-01 DIAGNOSIS — D480 Neoplasm of uncertain behavior of bone and articular cartilage: Secondary | ICD-10-CM | POA: Diagnosis not present

## 2021-08-01 DIAGNOSIS — G9389 Other specified disorders of brain: Secondary | ICD-10-CM

## 2021-08-01 DIAGNOSIS — M899 Disorder of bone, unspecified: Secondary | ICD-10-CM | POA: Diagnosis not present

## 2021-08-01 DIAGNOSIS — H4922 Sixth [abducent] nerve palsy, left eye: Secondary | ICD-10-CM

## 2021-08-01 NOTE — Telephone Encounter (Signed)
Harrison Mons from [facility] in [place] has seen her today and would like for Dr. Jonni Sanger to do a full work up for [employer] in her brain. They want to make sure she does not have cancer anywhere else. [phone]. You can call this number if you have any question.

## 2021-08-05 ENCOUNTER — Other Ambulatory Visit: Payer: Self-pay

## 2021-08-05 ENCOUNTER — Ambulatory Visit (INDEPENDENT_AMBULATORY_CARE_PROVIDER_SITE_OTHER): Payer: PPO | Admitting: Family Medicine

## 2021-08-05 ENCOUNTER — Ambulatory Visit: Payer: PPO | Admitting: Family Medicine

## 2021-08-05 ENCOUNTER — Encounter: Payer: Self-pay | Admitting: Family Medicine

## 2021-08-05 VITALS — BP 141/82 | HR 63 | Temp 96.5°F | Ht 66.0 in | Wt 201.8 lb

## 2021-08-05 DIAGNOSIS — H4920 Sixth [abducent] nerve palsy, unspecified eye: Secondary | ICD-10-CM

## 2021-08-05 DIAGNOSIS — I10 Essential (primary) hypertension: Secondary | ICD-10-CM | POA: Diagnosis not present

## 2021-08-05 DIAGNOSIS — N183 Chronic kidney disease, stage 3 unspecified: Secondary | ICD-10-CM | POA: Diagnosis not present

## 2021-08-05 DIAGNOSIS — M0579 Rheumatoid arthritis with rheumatoid factor of multiple sites without organ or systems involvement: Secondary | ICD-10-CM | POA: Diagnosis not present

## 2021-08-05 DIAGNOSIS — G939 Disorder of brain, unspecified: Secondary | ICD-10-CM

## 2021-08-05 DIAGNOSIS — M21372 Foot drop, left foot: Secondary | ICD-10-CM | POA: Diagnosis not present

## 2021-08-05 LAB — CBC WITH DIFFERENTIAL/PLATELET
Basophils Absolute: 0.1 10*3/uL (ref 0.0–0.1)
Basophils Relative: 0.6 % (ref 0.0–3.0)
Eosinophils Absolute: 0.3 10*3/uL (ref 0.0–0.7)
Eosinophils Relative: 4.1 % (ref 0.0–5.0)
HCT: 41.7 % (ref 36.0–46.0)
Hemoglobin: 13.2 g/dL (ref 12.0–15.0)
Lymphocytes Relative: 19.3 % (ref 12.0–46.0)
Lymphs Abs: 1.7 10*3/uL (ref 0.7–4.0)
MCHC: 31.8 g/dL (ref 30.0–36.0)
MCV: 88.1 fl (ref 78.0–100.0)
Monocytes Absolute: 0.8 10*3/uL (ref 0.1–1.0)
Monocytes Relative: 8.9 % (ref 3.0–12.0)
Neutro Abs: 5.7 10*3/uL (ref 1.4–7.7)
Neutrophils Relative %: 67.1 % (ref 43.0–77.0)
Platelets: 201 10*3/uL (ref 150.0–400.0)
RBC: 4.73 Mil/uL (ref 3.87–5.11)
RDW: 14.4 % (ref 11.5–15.5)
WBC: 8.6 10*3/uL (ref 4.0–10.5)

## 2021-08-05 NOTE — Patient Instructions (Signed)
Please return in July for your physical.  I am waiting to hear back from Dr. Redmond Pulling.   I will release your lab results to you on your MyChart account with further instructions. Please reply with any questions.    If you have any questions or concerns, please don't hesitate to send me a message via MyChart or call the office at [phone]. Thank you for visiting with Laura Chambers today! It's our pleasure caring for you.

## 2021-08-05 NOTE — Progress Notes (Signed)
Subjective  CC:  Chief Complaint  Patient presents with   brain mass    HPI: Laura Chambers is a 81 y.o. female who presents to the office today to address the problems listed above in the chief complaint. Reviewed multiple notes including ophthalmology, brain MRI report and neurosurgery office visit.  81 year old who had acute onset of double vision.  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  Brain MRI showed a brain lesion.  Neurosurgery evaluation ongoing.  Brain lesion may possibly be because of problem.  Concern for possible metastatic disease versus benign brain lesion.  Patient palsy is improving slightly which goes against diagnosis of metastatic disease.  However need to rule out primary cancer.  Patient is overall doing okay.  Still with double vision so using a patch.  Having difficulty with walking and has had recent falls due to foot drop so is in a wheelchair.  Denies shortness of breath, cough hematemesis, hemoptysis, abdominal pain, melena, weight loss, fevers, chills, back pain.  Mammogram is current.  Colonoscopy is current. Hypertension, chronic kidney disease and rheumatoid arthritis follow-up: All have remained stable on current medications.  Remains on Plaquenil.  Takes atenolol for blood pressure.  No chest pain.  No edema.  Assessment  1. Brain lesion   2. Abducens nerve palsy, unspecified laterality   3. Essential hypertension   4. Chronic kidney disease (CKD), active medical management without dialysis, stage 3 (moderate)   5. Left foot drop   6. Rheumatoid arthritis involving multiple sites with positive rheumatoid factor (HCC)      Plan  Brain lesion abducens palsy: We will discuss with neurosurgery.  Recheck lab work and rule out multiple myeloma with SPEP and UPEP.  We will consider PET scan or further imaging studies. Hypertension f/u: BP control is fairly well controlled.  No change in atenolol dosing today. Chronic kidney disease f/u: Clinically  stable.  Recheck lab work Rheumatoid arthritis, unsteady gait, left foot drop, and wheelchair currently especially given double vision.  Has history of femur fracture and frequent falls. I spent a total of 47 minutes for this patient encounter. Time spent included preparation, face-to-face counseling with the patient and coordination of care, review of chart and records, and documentation of the encounter.  Education regarding management of these chronic disease states was given. Management strategies discussed on successive visits include dietary and exercise recommendations, goals of achieving and maintaining IBW, and lifestyle modifications aiming for adequate sleep and minimizing stressors.   Follow up: 6 months for complete physical  Orders Placed This Encounter  Procedures   CBC with Differential/Platelet   Comprehensive metabolic panel   Protein Electrophoresis, (serum)   Protein Electrophoresis, Urine Rflx.       BP Readings from Last 3 Encounters:  08/05/21 (!) 141/82  07/22/21 (!) 166/78  04/03/21 123/66   Wt Readings from Last 3 Encounters:  08/05/21 201 lb 12.8 oz (91.5 kg)  07/22/21 206 lb (93.4 kg)  04/03/21 204 lb (92.5 kg)    Lab Results  Component Value Date   CHOL 118 01/15/2021   CHOL 141 05/16/2020   CHOL 140 08/17/2017   Lab Results  Component Value Date   HDL 49.40 01/15/2021   HDL 45 (L) 05/16/2020   HDL 39.10 08/17/2017   Lab Results  Component Value Date   LDLCALC 44 01/15/2021   LDLCALC 70 05/16/2020   [place] 57 04/15/2016   Lab Results  Component Value Date   TRIG 123.0 01/15/2021  TRIG 190 (H) 05/16/2020   TRIG 212.0 (H) 08/17/2017   Lab Results  Component Value Date   CHOLHDL 2 01/15/2021   CHOLHDL 3.1 05/16/2020   CHOLHDL 4 08/17/2017   Lab Results  Component Value Date   LDLDIRECT 58.0 08/17/2017   Lab Results  Component Value Date   CREATININE 1.33 (H) 08/05/2021   BUN 27 (H) 08/05/2021   NA 141 08/05/2021   K 4.6  08/05/2021   CL 104 08/05/2021   CO2 29 08/05/2021    The ASCVD Risk score (Arnett DK, et al., 2019) failed to calculate for the following reasons:   The 2019 ASCVD risk score is only valid for ages 52 to 7  I reviewed the patients updated PMH, FH, and SocHx.    Patient Active Problem List   Diagnosis Date Noted   Rheumatoid arthritis involving multiple sites with positive rheumatoid factor ([place]) 10/13/2018    Priority: High   Chronic kidney disease (CKD), active medical management without dialysis, stage 3 (moderate) 12/15/2017    Priority: High   Fibromyalgia 12/15/2017    Priority: High   Reactive depression (situational) 08/17/2017    Priority: High   Essential hypertension 12/08/2016    Priority: High   Peripheral neuropathy 01/12/2014    Priority: High   Dyslipidemia 06/16/2012    Priority: High   Anemia associated with chronic renal failure 05/16/2020    Priority: Medium    History of femur fracture supracondylar 2021, right 01/03/2020    Priority: Medium    Hx of colonic polyps 12/15/2017    Priority: Medium    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    Priority: Medium    DDD (degenerative disc disease), lumbar 12/15/2017    Priority: Medium    Chronic constipation 01/12/2020    Priority: Low   History of total right hip replacement 12/15/2017    Priority: Low   S/P total knee replacement, right 12/15/2017    Priority: Low   Left foot drop 12/15/2017    Priority: Low   Status post revision of total knee, left 06/09/2017    Priority: Low   Urine retention 12/02/2016    Priority: Low   Osteoporosis, 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 She has never used smokeless tobacco. She reports  that she does not drink alcohol and does not use drugs.  Current Meds  Medication Sig   atenolol (TENORMIN) 50 MG tablet TAKE 1 TABLET BY MOUTH EVERY DAY   Calcium Carbonate-Vitamin D (CALCIUM 600+D) 600-400 MG-UNIT tablet Take 1 tablet by mouth daily.    FLUoxetine (PROZAC) 20 MG capsule TAKE 1 CAPSULE BY MOUTH EVERY DAY   folic acid (FOLVITE) 756 MCG tablet Take 800 mcg daily by mouth.    furosemide (LASIX) 40 MG tablet Take 1 tablet (40 mg total) by mouth daily as needed. As needed for weight gain >/= 3 pounds. Notify provider if meds given.   gabapentin (NEURONTIN) 300 MG capsule Take 1 capsule (300 mg total) by mouth 2 (two) times daily.   Ginger, Zingiber officinalis, (GINGER ROOT PO) Take by mouth.   hydroxychloroquine (PLAQUENIL) 200 MG tablet TAKE 1 TABLET BY MOUTH EVERY DAY   Multiple Vitamin (MULTIVITAMIN WITH MINERALS) TABS tablet Take 1 tablet by mouth daily.    Omega-3 1000 MG CAPS Take  1 capsule by mouth daily.    polyethylene glycol (MIRALAX / GLYCOLAX) 17 g packet Take 17 g by mouth as needed.   rosuvastatin (CRESTOR) 10 MG tablet TAKE 1 TABLET BY MOUTH EVERY DAY   TART CHERRY PO Take by mouth.   Turmeric (QC TUMERIC COMPLEX PO) Take by mouth.   [DISCONTINUED] tamsulosin (FLOMAX) 0.4 MG CAPS capsule Take 1 capsule (0.4 mg total) by mouth daily. Give after same meal each day. Do not open, crush or chew tabs. (Patient taking differently: Take 0.4 mg by mouth as needed. Give after same meal each day. Do not open, crush or chew tabs.)    Review of Systems: Cardiovascular: negative for chest pain, palpitations, leg swelling, orthopnea Respiratory: negative for SOB, wheezing or persistent cough Gastrointestinal: negative for abdominal pain Genitourinary: negative for dysuria or gross hematuria  Objective  Vitals: BP (!) 141/82 (BP Location: Left Arm, Patient Position: Sitting, Cuff Size: Normal)    Pulse 63    Temp (!) 96.5 F (35.8 C) (Temporal)    Ht '5\' 6"'  (1.676 m)    Wt  201 lb 12.8 oz (91.5 kg)    SpO2 96%    BMI 32.57 kg/m  General: no acute distress, sitting in wheelchair Psych:  Alert and oriented, normal mood and affect HEENT:  Normocephalic, atraumatic, supple neck, left eye medially deviated but able to abduct partially. Cardiovascular:  RRR without murmur. no edema Respiratory:  Good breath sounds bilaterally, CTAB with normal respiratory effort Skin:  Warm, no rashes Neurologic:   Mental status is normal Commons side effects, risks, benefits, and alternatives for medications and treatment plan prescribed today were discussed, and the patient expressed understanding of the given instructions. Patient is instructed to call or message via MyChart if he/she has any questions or concerns regarding our treatment plan. No barriers to understanding were identified. We discussed Red Flag symptoms and signs in detail. Patient expressed understanding regarding what to do in case of urgent or emergency type symptoms.  Medication list was reconciled, printed and provided to the patient in AVS. Patient instructions and summary information was reviewed with the patient as documented in the AVS. This note was prepared with assistance of Dragon voice recognition software. Occasional wrong-word or sound-a-like substitutions may have occurred due to the inherent limitations of voice recognition software  This visit occurred during the SARS-CoV-2 public health emergency.  Safety protocols were in place, including screening questions prior to the visit, additional usage of staff PPE, and extensive cleaning of exam room while observing appropriate contact time as indicated for disinfecting solutions.

## 2021-08-05 NOTE — Telephone Encounter (Signed)
Please advise 

## 2021-08-06 LAB — COMPREHENSIVE METABOLIC PANEL
ALT: 12 U/L (ref 0–35)
AST: 21 U/L (ref 0–37)
Albumin: 4.1 g/dL (ref 3.5–5.2)
Alkaline Phosphatase: 100 U/L (ref 39–117)
BUN: 27 mg/dL — ABNORMAL HIGH (ref 6–23)
CO2: 29 mEq/L (ref 19–32)
Calcium: 9.6 mg/dL (ref 8.4–10.5)
Chloride: 104 mEq/L (ref 96–112)
Creatinine, Ser: 1.33 mg/dL — ABNORMAL HIGH (ref 0.40–1.20)
GFR: 37.6 mL/min — ABNORMAL LOW (ref >60.00)
Glucose, Bld: 69 mg/dL — ABNORMAL LOW (ref 70–99)
Potassium: 4.6 mEq/L (ref 3.5–5.1)
Sodium: 141 mEq/L (ref 135–145)
Total Bilirubin: 0.9 mg/dL (ref 0.2–1.2)
Total Protein: 7.2 g/dL (ref 6.0–8.3)

## 2021-08-07 ENCOUNTER — Telehealth: Payer: Self-pay

## 2021-08-07 LAB — PROTEIN ELECTROPHORESIS, SERUM
Albumin ELP: 3.7 g/dL — ABNORMAL LOW (ref 3.8–4.8)
Alpha 1: 0.3 g/dL (ref 0.2–0.3)
Alpha 2: 0.9 g/dL (ref 0.5–0.9)
Beta 2: 0.5 g/dL (ref 0.2–0.5)
Beta Globulin: 0.4 g/dL (ref 0.4–0.6)
Gamma Globulin: 0.9 g/dL (ref 0.8–1.7)
Total Protein: 6.7 g/dL (ref 6.1–8.1)

## 2021-08-07 NOTE — Telephone Encounter (Signed)
Is requesting follow up call on lab results once reviewed.

## 2021-08-07 NOTE — Telephone Encounter (Signed)
Discussed case with Dr. Redmond Pulling.  Test ordered for multiple myeloma.  Recommend PET scan to rule out primary cancer given brain mass.  Will order.  Please call patient to let her know that I ordered the PET scan and discussed this with Dr. Redmond Pulling.  We will call her to schedule.

## 2021-08-08 LAB — PROTEIN ELECTROPHORESIS, URINE REFLEX
Albumin ELP, Urine: 36.9 %
Alpha-1-Globulin, U: 3.3 %
Alpha-2-Globulin, U: 6.6 %
Beta Globulin, U: 38.3 %
Gamma Globulin, U: 15 %
Protein, Ur: 11.3 mg/dL

## 2021-08-08 MED ORDER — TAMSULOSIN HCL 0.4 MG PO CAPS: 0.4000 mg | ORAL_CAPSULE | ORAL | Status: DC | PRN

## 2021-08-08 NOTE — Progress Notes (Signed)
Please call patient: I have reviewed his/her lab results. Labs are ok. Discussed case with neurosurgery and have ordered a PET scan.

## 2021-08-08 NOTE — Telephone Encounter (Signed)
Patient requesting Lab results

## 2021-08-08 NOTE — Telephone Encounter (Signed)
Patient informed and verbalized understanding

## 2021-08-08 NOTE — Telephone Encounter (Signed)
Left message to return call to our office at their convenience.  

## 2021-08-15 ENCOUNTER — Other Ambulatory Visit: Payer: Self-pay | Admitting: Physician Assistant

## 2021-08-15 ENCOUNTER — Telehealth: Payer: Self-pay

## 2021-08-15 NOTE — Telephone Encounter (Signed)
Patient is calling to check up on PET scan order.  States she does not want to go to [place] but would prefer an [place] if at all possible.    Please follow up with patient in regard.

## 2021-08-15 NOTE — Telephone Encounter (Signed)
Next Visit: 01/20/2022  Last Visit: 07/22/2021  Labs: 08/05/2021 Glucose 69, BUN 27, Creat. 1.33, GFR 37.60  Eye exam: 12/9/2022WNL    Current Dose per office note 07/22/2021: Plaquenil 200 mg 1 tablet by mouth daily  AT:[id number] arthritis involving multiple sites with positive rheumatoid factor   Last Fill: 05/13/2021  Okay to refill Plaquenil?

## 2021-08-22 NOTE — Telephone Encounter (Signed)
Patient has been scheduled at [facility].

## 2021-08-22 NOTE — Telephone Encounter (Signed)
Patient has been scheduled in March.  Facility has told patient that our office has to process PA 24 hours in advance of scheduled appt.  Please process.

## 2021-08-30 DIAGNOSIS — H532 Diplopia: Secondary | ICD-10-CM | POA: Diagnosis not present

## 2021-08-30 DIAGNOSIS — H4922 Sixth [abducent] nerve palsy, left eye: Secondary | ICD-10-CM | POA: Diagnosis not present

## 2021-09-04 ENCOUNTER — Ambulatory Visit (HOSPITAL_COMMUNITY): Payer: PPO

## 2021-09-05 ENCOUNTER — Ambulatory Visit (HOSPITAL_COMMUNITY)
Admission: RE | Admit: 2021-09-05 | Discharge: 2021-09-05 | Disposition: A | Discharge status: Home / Self Care | Payer: PPO | Source: Ambulatory Visit | Attending: Family Medicine | Admitting: Family Medicine

## 2021-09-05 ENCOUNTER — Other Ambulatory Visit: Payer: Self-pay

## 2021-09-05 DIAGNOSIS — G9389 Other specified disorders of brain: Secondary | ICD-10-CM | POA: Insufficient documentation

## 2021-09-05 DIAGNOSIS — R590 Localized enlarged lymph nodes: Secondary | ICD-10-CM | POA: Diagnosis not present

## 2021-09-05 DIAGNOSIS — H4922 Sixth [abducent] nerve palsy, left eye: Secondary | ICD-10-CM | POA: Diagnosis not present

## 2021-09-05 LAB — GLUCOSE, CAPILLARY: Glucose-Capillary: 100 mg/dL — ABNORMAL HIGH (ref 70–99)

## 2021-09-05 MED ORDER — FLUDEOXYGLUCOSE F - 18 (FDG) INJECTION
9.9000 | Freq: Once | INTRAVENOUS | Status: AC
  Administered 2021-09-05: 9.9 via INTRAVENOUS

## 2021-09-06 ENCOUNTER — Telehealth: Payer: Self-pay | Admitting: Family Medicine

## 2021-09-06 ENCOUNTER — Telehealth (INDEPENDENT_AMBULATORY_CARE_PROVIDER_SITE_OTHER): Payer: PPO | Admitting: Physician Assistant

## 2021-09-06 ENCOUNTER — Encounter: Payer: Self-pay | Admitting: Physician Assistant

## 2021-09-06 VITALS — Ht 66.0 in

## 2021-09-06 DIAGNOSIS — J0100 Acute maxillary sinusitis, unspecified: Secondary | ICD-10-CM | POA: Diagnosis not present

## 2021-09-06 MED ORDER — AZITHROMYCIN 500 MG PO TABS: 500.0000 mg | ORAL_TABLET | Freq: Every day | ORAL | 0 refills | Status: AC

## 2021-09-06 NOTE — Progress Notes (Signed)
?  Virtual Visit via Telephone Note ? ?I connected with Laura Chambers on 09/06/21 at 11:45 AM EST by telephone and verified that I am speaking with the correct person using two identifiers. ? ?Location: ?Patient: home ?Provider: Therapist, music at [employer] ?  ?I discussed the limitations, risks, security and privacy concerns of performing an evaluation and management service by telephone and the availability of in person appointments. I also discussed with the patient that there may be a patient responsible charge related to this service. The patient expressed understanding and agreed to proceed. ? ? ?History of Present Illness: ? ?Chief complaint: Sinusitis  ?Symptom onset: 4 days ago  ?Pertinent positives: Runny nose, cough, left ear pain, left maxillary sinus pain ?Pertinent negatives: Fever, body aches, N/V/D, CP, SOB ?Treatments tried: Gargling with Listerine, nasal saline  ?Vaccine status: First three COVID-19 vaccines  ?Sick exposure: No known sick contacts  ? ?Home COVID-19 test is negative this morning.  ? ?  ?Observations/Objective: ? ?BP: 134/65 - has not taken her medication this morning ?HR: 68 bpm ? ?Sounds congested. Speaking clearly without pauses for breath.  ? ?Assessment and Plan: ? ?1. Acute maxillary sinusitis, recurrence not specified ? ?Persistent symptoms despite conservative efforts at home. Will Rx Zithromax at this time, take with food. Cautioned on antibiotic use and possible side effects. Advised nasal saline, humidifier, and pushing fluids. Call if worse or no improvement.  ? ? ?Follow Up Instructions: ? ?  ?I discussed the assessment and treatment plan with the patient. The patient was provided an opportunity to ask questions and all were answered. The patient agreed with the plan and demonstrated an understanding of the instructions. ?  ?The patient was advised to call back or seek an in-person evaluation if the symptoms worsen or if the condition fails to improve  as anticipated. ? ?I provided 9 minutes 19 sec of non-face-to-face time during this encounter. ? ? ?Markham Dumlao M Jhene Westmoreland, PA-C ? ?

## 2021-09-06 NOTE — Telephone Encounter (Signed)
Pt has a Virtual scheduled with [facility] today at 11:30. ?

## 2021-09-06 NOTE — Telephone Encounter (Signed)
Pt states she is needing a Zpac called in for her due to her having a sinus infection. I informed pt she would need to be seen. She refused stating she has no one to bring her in. Please advise. ?

## 2021-09-06 NOTE — Telephone Encounter (Signed)
Please see message and advise 

## 2021-09-06 NOTE — Telephone Encounter (Signed)
Noted  

## 2021-09-09 NOTE — Progress Notes (Signed)
Please call patient: I have reviewed his/her PET  results. No clear evidence of malignancy. Please forward to her neurosurgeon for review as well.  ?thanks

## 2021-09-19 ENCOUNTER — Other Ambulatory Visit: Payer: Self-pay | Admitting: Physician Assistant

## 2021-09-27 DIAGNOSIS — H532 Diplopia: Secondary | ICD-10-CM | POA: Diagnosis not present

## 2021-09-27 DIAGNOSIS — H4922 Sixth [abducent] nerve palsy, left eye: Secondary | ICD-10-CM | POA: Diagnosis not present

## 2021-10-07 ENCOUNTER — Ambulatory Visit (INDEPENDENT_AMBULATORY_CARE_PROVIDER_SITE_OTHER): Payer: PPO

## 2021-10-07 DIAGNOSIS — Z0000 Encounter for general adult medical examination without abnormal findings: Secondary | ICD-10-CM | POA: Diagnosis not present

## 2021-10-07 NOTE — Progress Notes (Addendum)
Virtual Visit via Telephone Note ? ?I connected with  Laura Chambers on 10/07/21 at 12:00 PM EDT by telephone and verified that I am speaking with the correct person using two identifiers. ? ?Medicare Annual Wellness visit completed telephonically due to Covid-19 pandemic.  ? ?Persons participating in this call: This Health Coach and this patient.  ? ?Location: ?Patient: Home ?Provider: Office  ?  ?I discussed the limitations, risks, security and privacy concerns of performing an evaluation and management service by telephone and the availability of in person appointments. The patient expressed understanding and agreed to proceed. ? ?Unable to perform video visit due to video visit attempted and failed and/or patient does not have video capability.  ? ?Some vital signs may be absent or patient reported.  ? ?Willette Brace, LPN ? ? ?Subjective:  ? Laura Chambers is a 81 y.o. female who presents for Medicare Annual (Subsequent) preventive examination. ? ?Review of Systems    ? ?Cardiac Risk Factors include: advanced age (>8mn, >>62women);obesity (BMI >30kg/m2);dyslipidemia;hypertension ? ?   ?Objective:  ?  ?There were no vitals filed for this visit. ?There is no height or weight on file to calculate BMI. ? ? ?  10/07/2021  ? 12:08 PM 08/05/2021  ?  1:32 PM 07/16/2020  ?  1:15 PM 01/30/2020  ?  2:00 PM 01/18/2020  ?  9:47 AM 01/16/2020  ?  9:08 AM 01/12/2020  ?  9:52 AM  ?Advanced Directives  ?Does Patient Have a Medical Advance Directive? Yes No No Yes Yes Yes Yes  ?Type of Advance Directive Healthcare Power of Attorney        ?Does patient want to make changes to medical advance directive?    No - Patient declined No - Patient declined No - Patient declined No - Patient declined  ?Copy of [place] Chart? No - copy requested        ?Would patient like information on creating a medical advance directive?   No - Patient declined      ? ? ?Current Medications (verified) ?Outpatient Encounter  Medications as of 10/07/2021  ?Medication Sig  ? atenolol (TENORMIN) 50 MG tablet TAKE 1 TABLET BY MOUTH EVERY DAY  ? Calcium Carbonate-Vitamin D (CALCIUM 600+D) 600-400 MG-UNIT tablet Take 1 tablet by mouth daily.   ? FLUoxetine (PROZAC) 20 MG capsule TAKE 1 CAPSULE BY MOUTH EVERY DAY  ? folic acid (FOLVITE) 8623MCG tablet Take 800 mcg daily by mouth.   ? furosemide (LASIX) 40 MG tablet Take 1 tablet (40 mg total) by mouth daily as needed. As needed for weight gain >/= 3 pounds. Notify provider if meds given.  ? gabapentin (NEURONTIN) 300 MG capsule Take 1 capsule (300 mg total) by mouth 2 (two) times daily.  ? Ginger, Zingiber officinalis, (GINGER ROOT PO) Take by mouth.  ? hydroxychloroquine (PLAQUENIL) 200 MG tablet TAKE 1 TABLET BY MOUTH EVERY DAY  ? Multiple Vitamin (MULTIVITAMIN WITH MINERALS) TABS tablet Take 1 tablet by mouth daily.   ? Omega-3 1000 MG CAPS Take 1 capsule by mouth daily.   ? polyethylene glycol (MIRALAX / GLYCOLAX) 17 g packet Take 17 g by mouth as needed.  ? rosuvastatin (CRESTOR) 10 MG tablet TAKE 1 TABLET BY MOUTH EVERY DAY  ? tamsulosin (FLOMAX) 0.4 MG CAPS capsule Take 1 capsule (0.4 mg total) by mouth as needed. Give after same meal each day. Do not open, crush or chew tabs.  ? TART CHERRY PO  Take by mouth.  ? Turmeric (QC TUMERIC COMPLEX PO) Take by mouth.  ? ?No facility-administered encounter medications on file as of 10/07/2021.  ? ? ?Allergies (verified) ?Ativan [lorazepam], Haldol [haloperidol], Epinephrine, Levofloxacin, Lipitor [atorvastatin], Robaxin [methocarbamol], and Trovan [alatrofloxacin]  ? ?History: ?Past Medical History:  ?Diagnosis Date  ? Anemia   ? Anxiety   ? Arthritis   ? Atelectasis, left   ? left lower lung  ? Blood transfusion without reported diagnosis   ? Cataract cortical, senile, bilateral 03/19/2018  ? Complication of anesthesia 2012  ? "irritated trachea" with cough x 1 year from intubation 2012.  smaller tube used with other surgeries  ? Depression   ?  just lost her son a month ago  ? Esophageal hernia   ? Fibromyalgia   ? Headache   ? occasionally  ? HTN (hypertension)   ? Hx of colonic polyps   ? Hydronephrosis of right kidney   ? Hypercholesterolemia   ? Hypertensive retinopathy of both eyes 03/19/2018  ? Neuropathy due to medical condition [facility])   ? bilateral lower legs  ? Obesity   ? Osteoarthritis (arthritis due to wear and tear of joints)   ? Osteoporosis   ? Renal angiomyolipoma   ? Rheumatoid arthritis ([place])   ? ?Past Surgical History:  ?Procedure Laterality Date  ? ABDOMINAL HYSTERECTOMY  1969  ? BACK SURGERY  2012  ? rod in back-  lumbar disc removal with bone graft  ? blephoroplasty    ? bilateral  ? BREAST BIOPSY  1989  ? CATARACT EXTRACTION Bilateral   ? 05/2020, 06/2020  ? CATARACT EXTRACTION, BILATERAL    ? CHOLECYSTECTOMY  2010  ? COLONOSCOPY  2022  ? with polypectomy  ? EXCISIONAL TOTAL KNEE ARTHROPLASTY WITH ANTIBIOTIC SPACERS Left 11/14/2016  ? Procedure: EXCISIONAL LEFT TOTAL KNEE ARTHROPLASTY WITH  PLACEMENT OF ANTIBIOTIC SPACERS;  Surgeon: [name], MD;  Location: [facility];  Service: Orthopedics;  Laterality: Left;  ? JOINT REPLACEMENT    ? NISSEN FUNDOPLICATION    ? ORIF FEMUR FRACTURE Right 01/04/2020  ? Procedure: OPEN REDUCTION INTERNAL FIXATION (ORIF) DISTAL FEMUR FRACTURE;  Surgeon: Shona Needles, MD;  Location: [facility];  Service: Orthopedics;  Laterality: Right;  ? PARAESOPHAGEAL HERNIA REPAIR  1989  ? REVISION TOTAL KNEE ARTHROPLASTY Left 06/09/2017  ? TONSILLECTOMY  1946  ? TOTAL HIP ARTHROPLASTY Right 08/31/2012  ? Procedure: TOTAL HIP ARTHROPLASTY ANTERIOR APPROACH;  Surgeon: Mauri Pole, MD;  Location: [facility];  Service: Orthopedics;  Laterality: Right;  ? TOTAL KNEE ARTHROPLASTY  2001  ? left  ? TOTAL KNEE ARTHROPLASTY  2010  ? right  ? TOTAL KNEE REVISION Left 06/09/2017  ? Procedure: LEFT REVISION KNEE ARTHROPLASTY;  Surgeon: [name], MD;  Location: [facility];  Service: Orthopedics;  Laterality:  Left;  ? ?Family History  ?Problem Relation Age of Onset  ? Diabetes Father   ? Osteoarthritis Father   ? Heart disease Father   ? Stroke Father   ? Osteoarthritis Mother   ? Stroke Mother   ? Pneumonia Mother   ? Colon cancer Sister 70  ? Colon polyps Other   ?     neice/nephew  ? Diabetes Sister   ? Diabetes Brother   ? ?Social History  ? ?Socioeconomic History  ? Marital status: Divorced  ?  Spouse name: Not on file  ? Number of children: 3  ? Years of education: College  ? Highest education level: Not  on file  ?Occupational History  ?  Employer: [employer]  ?  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 Smoking status: Never  ? Smokeless tobacco: Never  ?Vaping Use  ? Vaping Use: Never used  ?Substance and Sexual Activity  ? Alcohol use: No  ? Drug use: No  ? Sexual activity: Never  ?Other Topics Concern  ? Not on file  ?Social History Narrative  ? Patient lives at home alone.  ? Caffeine Use: 1 cup daily  ? ?Social Determinants of Health  ? ?Financial Resource Strain: Low Risk   ? Difficulty of Paying Living Expenses: Not hard at all  ?Food Insecurity: No Food Insecurity  ? Worried About Charity fundraiser in the Last Year: Never true  ? Ran Out of Food in the Last Year: Never true  ?Transportation Needs: No Transportation Needs  ? Lack of Transportation (Medical): No  ? Lack of Transportation (Non-Medical): No  ?Physical Activity: Sufficiently Active  ? Days of Exercise per Week: 5 days  ? Minutes of Exercise per Session: 60 min  ?Stress: No Stress Concern Present  ? Feeling of Stress : Not at all  ?Social Connections: Moderately Isolated  ? Frequency of Communication with Friends and Family: More than three times a week  ? Frequency of Social Gatherings with Friends and Family: Once a week  ? Attends Religious Services: 1 to 4 times per year  ? Active Member of Clubs or Organizations: No  ? Attends Archivist Meetings: Never  ? Marital Status: Divorced  ? ? ?Tobacco Counseling ?Counseling given:  Not Answered ? ? ?Clinical Intake: ? ?Pre-visit preparation completed: Yes ? ?Pain : No/denies pain ? ?  ? ?BMI - recorded: 32.57 ?Nutritional Status: BMI > 30  Obese ?Nutritional Risks: None ?Diabetes: No ? ?Ho

## 2021-10-07 NOTE — Patient Instructions (Addendum)
[name] , ?Thank you for taking time to come for your Medicare Wellness Visit. I appreciate your ongoing commitment to your health goals. Please review the following plan we discussed and let me know if I can assist you in the future.  ? ?Screening recommendations/referrals: ?Colonoscopy: Done 04/03/21 repeat every 5 years  ?Mammogram: Done 02/26/21 repeat every year ?Bone Density: Done 02/08/21 repeat  ?Recommended yearly ophthalmology/optometry visit for glaucoma screening and checkup ?Recommended yearly dental visit for hygiene and checkup ? ?Vaccinations: ?Influenza vaccine: Done 07/18/21 repeat every year  ?Pneumococcal vaccine: Up to date ?Tdap vaccine: not a candidate  ?Shingles vaccine: Shingrix discussed. Please contact your pharmacy for coverage information.    ?Covid-19:Completed 2/26, 3/26, 05/08/20 ? ?Advanced directives: [name] , ?Thank you for taking time to come for your Medicare Wellness Visit. I appreciate your ongoing commitment to your health goals. Please review the following plan we discussed and let me know if I can assist you in the future.  ? ?Screening recommendations/referrals: ?Colonoscopy: Done 04/03/21 repeat every 5 year  ?Mammogram: Done 02/26/21 repeat every year  ?Bone Density: Done 02/08/21 repeat every 2 years  ?Recommended yearly ophthalmology/optometry visit for glaucoma screening and checkup ?Recommended yearly dental visit for hygiene and checkup ? ?Vaccinations: ?Influenza vaccine: Done 07/18/21 repeat every year  ?Pneumococcal vaccine: Up to date ?Tdap vaccine: not a candidate  ?Shingles vaccine: Shingrix discussed. Please contact your pharmacy for coverage information.    ?Covid-19:Completed 2/26, 3/26, 05/08/20 ? ?Advanced directives: Please bring a copy of your health care power of attorney and living will to the office at your convenience. ? ?Conditions/risks identified: Get back to walking without a walker and better balance  ? ?Next appointment: Follow up in one year for  your annual wellness visit  ? ? ?Preventive Care 21 Years and Older, Female ?Preventive care refers to lifestyle choices and visits with your health care provider that can promote health and wellness. ?What does preventive care include? ?A yearly physical exam. This is also called an annual well check. ?Dental exams once or twice a year. ?Routine eye exams. Ask your health care provider how often you should have your eyes checked. ?Personal lifestyle choices, including: ?Daily care of your teeth and gums. ?Regular physical activity. ?Eating a healthy diet. ?Avoiding tobacco and drug use. ?Limiting alcohol use. ?Practicing safe sex. ?Taking low-dose aspirin every day. ?Taking vitamin and mineral supplements as recommended by your health care provider. ?What happens during an annual well check? ?The services and screenings done by your health care provider during your annual well check will depend on your age, overall health, lifestyle risk factors, and family history of disease. ?Counseling  ?Your health care provider may ask you questions about your: ?Alcohol use. ?Tobacco use. ?Drug use. ?Emotional well-being. ?Home and relationship well-being. ?Sexual activity. ?Eating habits. ?History of falls. ?Memory and ability to understand (cognition). ?Work and work Statistician. ?Reproductive health. ?Screening  ?You may have the following tests or measurements: ?Height, weight, and BMI. ?Blood pressure. ?Lipid and cholesterol levels. These may be checked every 5 years, or more frequently if you are over 81 years old. ?Skin check. ?Lung cancer screening. You may have this screening every year starting at age 23 if you have a 30-pack-year history of smoking and currently smoke or have quit within the past 15 years. ?Fecal occult blood test (FOBT) of the stool. You may have this test every year starting at age 31. ?Flexible sigmoidoscopy or colonoscopy. You may have a sigmoidoscopy every  5 years or a colonoscopy every 10  years starting at age 56. ?Hepatitis C blood test. ?Hepatitis B blood test. ?Sexually transmitted disease (STD) testing. ?Diabetes screening. This is done by checking your blood sugar (glucose) after you have not eaten for a while (fasting). You may have this done every 1-3 years. ?Bone density scan. This is done to screen for osteoporosis. You may have this done starting at age 26. ?Mammogram. This may be done every 1-2 years. Talk to your health care provider about how often you should have regular mammograms. ?Talk with your health care provider about your test results, treatment options, and if necessary, the need for more tests. ?Vaccines  ?Your health care provider may recommend certain vaccines, such as: ?Influenza vaccine. This is recommended every year. ?Tetanus, diphtheria, and acellular pertussis (Tdap, Td) vaccine. You may need a Td booster every 10 years. ?Zoster vaccine. You may need this after age 84. ?Pneumococcal 13-valent conjugate (PCV13) vaccine. One dose is recommended after age 52. ?Pneumococcal polysaccharide (PPSV23) vaccine. One dose is recommended after age 57. ?Talk to your health care provider about which screenings and vaccines you need and how often you need them. ?This information is not intended to replace advice given to you by your health care provider. Make sure you discuss any questions you have with your health care provider. ?Document Released: 07/20/2015 Document Revised: 03/12/2016 Document Reviewed: 04/24/2015 ?Elsevier Interactive Patient Education ? 2017 [place]. ? ?Fall Prevention in the Home ?Falls can cause injuries. They can happen to people of all ages. There are many things you can do to make your home safe and to help prevent falls. ?What can I do on the outside of my home? ?Regularly fix the edges of walkways and driveways and fix any cracks. ?Remove anything that might make you trip as you walk through a door, such as a raised step or threshold. ?Trim any  bushes or trees on the path to your home. ?Use bright outdoor lighting. ?Clear any walking paths of anything that might make someone trip, such as rocks or tools. ?Regularly check to see if handrails are loose or broken. Make sure that both sides of any steps have handrails. ?Any raised decks and porches should have guardrails on the edges. ?Have any leaves, snow, or ice cleared regularly. ?Use sand or salt on walking paths during winter. ?Clean up any spills in your garage right away. This includes oil or grease spills. ?What can I do in the bathroom? ?Use night lights. ?Install grab bars by the toilet and in the tub and shower. Do not use towel bars as grab bars. ?Use non-skid mats or decals in the tub or shower. ?If you need to sit down in the shower, use a plastic, non-slip stool. ?Keep the floor dry. Clean up any water that spills on the floor as soon as it happens. ?Remove soap buildup in the tub or shower regularly. ?Attach bath mats securely with double-sided non-slip rug tape. ?Do not have throw rugs and other things on the floor that can make you trip. ?What can I do in the bedroom? ?Use night lights. ?Make sure that you have a light by your bed that is easy to reach. ?Do not use any sheets or blankets that are too big for your bed. They should not hang down onto the floor. ?Have a firm chair that has side arms. You can use this for support while you get dressed. ?Do not have throw rugs and other things on  the floor that can make you trip. ?What can I do in the kitchen? ?Clean up any spills right away. ?Avoid walking on wet floors. ?Keep items that you use a lot in easy-to-reach places. ?If you need to reach something above you, use a strong step stool that has a grab bar. ?Keep electrical cords out of the way. ?Do not use floor polish or wax that makes floors slippery. If you must use wax, use non-skid floor wax. ?Do not have throw rugs and other things on the floor that can make you trip. ?What can I do  with my stairs? ?Do not leave any items on the stairs. ?Make sure that there are handrails on both sides of the stairs and use them. Fix handrails that are broken or loose. Make sure that handrails are as lon

## 2021-10-22 ENCOUNTER — Other Ambulatory Visit: Payer: Self-pay | Admitting: Physician Assistant

## 2021-10-22 NOTE — Telephone Encounter (Signed)
Next Visit: Due July 2023. Message sent to the front to schedule patient ? ?Last Visit: 07/22/2021 ? ?Labs: 08/05/2021 Creat. 1.33, GFR 37.60, BUN 27, Glucose 69 ? ?Eye exam: 12/9/2022WNL   ? ?Current Dose per office note 07/22/2021: Plaquenil 200 mg 1 tablet by mouth daily ? ?DX: Rheumatoid arthritis involving multiple sites with positive rheumatoid factor  ? ?Last Fill: 08/15/2021 ? ?Okay to refill Plaquenil?  ?

## 2021-11-07 DIAGNOSIS — R519 Headache, unspecified: Secondary | ICD-10-CM | POA: Diagnosis not present

## 2021-11-07 DIAGNOSIS — D480 Neoplasm of uncertain behavior of bone and articular cartilage: Secondary | ICD-10-CM | POA: Diagnosis not present

## 2021-11-07 DIAGNOSIS — Z9181 History of falling: Secondary | ICD-10-CM | POA: Diagnosis not present

## 2021-11-07 DIAGNOSIS — H532 Diplopia: Secondary | ICD-10-CM | POA: Diagnosis not present

## 2021-11-07 DIAGNOSIS — H4922 Sixth [abducent] nerve palsy, left eye: Secondary | ICD-10-CM | POA: Diagnosis not present

## 2021-11-07 DIAGNOSIS — M899 Disorder of bone, unspecified: Secondary | ICD-10-CM | POA: Diagnosis not present

## 2021-11-11 ENCOUNTER — Encounter: Payer: Self-pay | Admitting: Family Medicine

## 2021-11-11 ENCOUNTER — Telehealth (INDEPENDENT_AMBULATORY_CARE_PROVIDER_SITE_OTHER): Payer: PPO | Admitting: Family Medicine

## 2021-11-11 ENCOUNTER — Telehealth: Payer: Self-pay

## 2021-11-11 DIAGNOSIS — G939 Disorder of brain, unspecified: Secondary | ICD-10-CM | POA: Diagnosis not present

## 2021-11-11 DIAGNOSIS — R948 Abnormal results of function studies of other organs and systems: Secondary | ICD-10-CM | POA: Diagnosis not present

## 2021-11-11 DIAGNOSIS — R590 Localized enlarged lymph nodes: Secondary | ICD-10-CM | POA: Diagnosis not present

## 2021-11-11 NOTE — Telephone Encounter (Signed)
error 

## 2021-11-11 NOTE — Progress Notes (Signed)
? ?TELEPHONE ENCOUNTER ?  ?Patient verbally agreed to telephone visit and is aware that copayment and coinsurance may apply. Patient was treated using telemedicine according to accepted telemedicine protocols. ? ?Location of the patient: home ?Location of provider: [place], [place] ?Names of all persons participating in the telemedicine service and role in the encounter: Leamon Arnt, MD Darlina Rumpf, [facility] ? ? ?Subjective  ?CC:  ?Chief Complaint  ?Patient presents with  ? Discuss results  ?  Discuss pet scan results, concerned about lymph nodes in pelvic area  ? ? ?HPI: Laura Chambers is a 81 y.o. female who was telephoned today to address the problems listed above in the chief complaint. ?81 year old with history of diplopia and abnormal brain CT.  Following along with neurosurgery.  PET scan was ordered to look for any underlying malignancy.  I reviewed recent neurosurgery notes from today.  He feels that the brain lesion is mildly larger and would like a biopsy.  They are contacting ENT.  However patient is hesitant to do so.  Also had some abnormal lymph nodes in the right pelvic and inguinal area.  This was thought to be possibly reactive but cannot rule out lymphoma or malignancy.  She would like follow-up on this.  She has chronic right hip problems after a femur fracture that remains unhealed.  She otherwise is feeling much better overall.  Her blood work was unremarkable.  No other lymphadenopathy was found on PET scan.  I reviewed multiple imaging studies including old pelvic CT scan. ? ?ASSESSMENT: ?1. Abnormal positron emission tomography (PET) scan   ?2. Brain lesion   ?3. Pelvic lymphadenopathy   ? ? ?Discussed PET scan results in detail.  Brain lesion to be followed up with neurosurgery and ENT.  Inguinal and pelvic lymphadenopathy: Clinically favor reactive pathology but will get input from her orthopedic surgeon.  May need further imaging to clarify.  Await his  recommendations.  She will follow-up with me after seeing him.  She sees Dr. Doreatha Martin ? ?Time spent with the patient (non face-to-face time during this virtual encounter): 16 minutes, spent in obtaining information about her symptoms, reviewing her previous labs, evaluations, and treatments, counseling her about her condition (please see the discussed topics above), and developing a plan to further investigate it; the patient was provided an opportunity to ask questions and all were answered. The patient agreed with the plan and demonstrated an understanding of the instructions. ?  ?The patient was advised to call back or seek an in-person evaluation if the symptoms worsen or if the condition fails to improve as anticipated. ? ?Follow up: As scheduled ?01/21/2022 ? ?No orders of the defined types were placed in this encounter. ? ?No orders of the defined types were placed in this encounter. ? ? ? ?I reviewed the patients updated PMH, FH, and SocHx.  ?  ?Patient Active Problem List  ? Diagnosis Date Noted  ? Rheumatoid arthritis involving multiple sites with positive rheumatoid factor ([place]) 10/13/2018  ?  Priority: High  ? Chronic kidney disease (CKD), active medical management without dialysis, stage 3 (moderate) 12/15/2017  ?  Priority: High  ? Fibromyalgia 12/15/2017  ?  Priority: High  ? Reactive depression (situational) 08/17/2017  ?  Priority: High  ? Essential hypertension 12/08/2016  ?  Priority: High  ? Peripheral neuropathy 01/12/2014  ?  Priority: High  ? Dyslipidemia 06/16/2012  ?  Priority: High  ? Anemia associated with chronic renal failure  05/16/2020  ?  Priority: Medium   ? History of femur fracture supracondylar 2021, right 01/03/2020  ?  Priority: Medium   ? Hx of colonic polyps 12/15/2017  ?  Priority: Medium   ? 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  ?  Priority: Medium   ? DDD (degenerative disc disease), lumbar 12/15/2017  ?  Priority: Medium   ? Chronic constipation 01/12/2020  ?  Priority: Low  ?  History of total right hip replacement 12/15/2017  ?  Priority: Low  ? S/P total knee replacement, right 12/15/2017  ?  Priority: Low  ? Left foot drop 12/15/2017  ?  Priority: Low  ? Status post revision of total knee, left 06/09/2017  ?  Priority: Low  ? Urine retention 12/02/2016  ?  Priority: Low  ? Osteoporosis, post-menopausal 06/24/2021  ? 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   ? ?Current Meds  ?Medication Sig  ? atenolol (TENORMIN) 50 MG tablet TAKE 1 TABLET BY MOUTH EVERY DAY  ? Calcium Carbonate-Vitamin D (CALCIUM 600+D) 600-400 MG-UNIT tablet Take 1 tablet by mouth daily.   ? FLUoxetine (PROZAC) 20 MG capsule TAKE 1 CAPSULE BY MOUTH EVERY DAY  ? folic acid (FOLVITE) 841 MCG tablet Take 800 mcg daily by mouth.   ? furosemide (LASIX) 40 MG tablet Take 1 tablet (40 mg total) by mouth daily as needed. As needed for weight gain >/= 3 pounds. Notify provider if meds given.  ? gabapentin (NEURONTIN) 300 MG capsule Take 1 capsule (300 mg total) by mouth 2 (two) times daily.  ? Ginger, Zingiber officinalis, (GINGER ROOT PO) Take by mouth.  ? hydroxychloroquine (PLAQUENIL) 200 MG tablet TAKE 1 TABLET BY MOUTH EVERY DAY  ? Multiple Vitamin (MULTIVITAMIN WITH MINERALS) TABS tablet Take 1 tablet by mouth daily.   ? Omega-3 1000 MG CAPS Take 1 capsule by mouth daily.   ? polyethylene glycol (MIRALAX / GLYCOLAX) 17 g packet Take 17 g by mouth as needed.  ? rosuvastatin (CRESTOR) 10 MG tablet TAKE 1 TABLET BY MOUTH EVERY DAY  ? tamsulosin (FLOMAX) 0.4 MG CAPS capsule Take 1 capsule (0.4 mg total) by mouth as needed. Give after same meal each day. Do not open, crush or chew tabs.  ? TART CHERRY PO Take by mouth.  ? Turmeric (QC TUMERIC COMPLEX PO) Take by mouth.  ? ? ?Allergies: ?Patient is allergic to ativan [lorazepam], haldol [haloperidol], epinephrine, levofloxacin, lipitor [atorvastatin], robaxin [methocarbamol], and trovan [alatrofloxacin]. ?Family History: ?Patient family history  includes Colon cancer (age of onset: 26) in her sister; Colon polyps in an other family member; Diabetes in her brother, father, and sister; Heart disease in her father; Osteoarthritis in her father and mother; Pneumonia in her mother; Stroke in her father and mother. ?Social History:  ?Patient  reports that she has never smoked. She has never used smokeless tobacco. She reports that she does not drink alcohol and does not use drugs. ? ?Review of Systems: ?Constitutional: Negative for fever malaise or anorexia ?Cardiovascular: negative for chest pain ?Respiratory: negative for SOB or persistent cough ?Gastrointestinal: negative for abdominal pain ? ? ? ? ?[id number] physician/qualified health professional telephone evaluation 5 to 10 minutes ?[id number] physician/qualified help functional [place] evaluation for 11 to 20 minutes ?[id number] physician/qualify he will professional telephone evaluation for 21 to 30 minutes ?

## 2022-01-01 DIAGNOSIS — H532 Diplopia: Secondary | ICD-10-CM | POA: Diagnosis not present

## 2022-01-01 DIAGNOSIS — H4922 Sixth [abducent] nerve palsy, left eye: Secondary | ICD-10-CM | POA: Diagnosis not present

## 2022-01-20 ENCOUNTER — Ambulatory Visit: Payer: PPO | Admitting: Rheumatology

## 2022-01-21 ENCOUNTER — Encounter: Payer: PPO | Admitting: Family Medicine

## 2022-01-31 DIAGNOSIS — H532 Diplopia: Secondary | ICD-10-CM | POA: Diagnosis not present

## 2022-01-31 DIAGNOSIS — M898X8 Other specified disorders of bone, other site: Secondary | ICD-10-CM | POA: Diagnosis not present

## 2022-02-11 ENCOUNTER — Ambulatory Visit: Payer: PPO | Admitting: Family Medicine

## 2022-02-13 ENCOUNTER — Other Ambulatory Visit: Payer: Self-pay | Admitting: Family Medicine

## 2022-02-13 DIAGNOSIS — I10 Essential (primary) hypertension: Secondary | ICD-10-CM

## 2022-02-19 ENCOUNTER — Telehealth: Payer: Self-pay | Admitting: Rheumatology

## 2022-02-19 NOTE — Telephone Encounter (Signed)
Patient advised her last appointment and labs were in January 2023. Patient advised we will need her to have a follow up visit and updated labs prior to refilling her medication. Patient has been scheduled for 02/20/2022 at 1:00 pm.

## 2022-02-19 NOTE — Telephone Encounter (Signed)
Patient called the office requesting a refill of Plaquenil be sent to [facility] in [place]. Patient states she only has one tablet left.

## 2022-02-19 NOTE — Progress Notes (Unsigned)
Office Visit Note  Patient: Laura Chambers             Date of Birth: 04-09-1941           MRN: [id number]             PCP: Leamon Arnt, MD Referring: Leamon Arnt, MD Visit Date: 02/20/2022 Occupation: '@GUAROCC'$ @  Subjective:  No chief complaint on file.   History of Present Illness: Laura Chambers is a 81 y.o. female ***   Activities of Daily Living:  Patient reports morning stiffness for *** {minute/hour:19697}.   Patient {ACTIONS;DENIES/REPORTS:21021675::"Denies"} nocturnal pain.  Difficulty dressing/grooming: {ACTIONS;DENIES/REPORTS:21021675::"Denies"} Difficulty climbing stairs: {ACTIONS;DENIES/REPORTS:21021675::"Denies"} Difficulty getting out of chair: {ACTIONS;DENIES/REPORTS:21021675::"Denies"} Difficulty using hands for taps, buttons, cutlery, and/or writing: {ACTIONS;DENIES/REPORTS:21021675::"Denies"}  No Rheumatology ROS completed.   PMFS History:  Patient Active Problem List   Diagnosis Date Noted  . Osteoporosis, post-menopausal 06/24/2021  . Anemia associated with chronic renal failure 05/16/2020  . Chronic constipation 01/12/2020  . History of femur fracture supracondylar 2021, right 01/03/2020  . 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 Rheumatoid arthritis involving multiple sites with positive rheumatoid factor ([place]) 10/13/2018  . Hx of colonic polyps 12/15/2017  . 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 Fibromyalgia 12/15/2017  . History of total right hip replacement 12/15/2017  . DDD (degenerative disc disease), lumbar 12/15/2017  . 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 Left foot drop 12/15/2017  . 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 Status post revision of total knee, left 06/09/2017  . Essential hypertension 12/08/2016  . Urine retention 12/02/2016  . Peripheral neuropathy  01/12/2014  . Dyslipidemia 06/16/2012    Past Medical History:  Diagnosis Date  . Anemia   . Anxiety   . Arthritis   . Atelectasis, left    left lower lung  . Blood transfusion without reported diagnosis   . Cataract cortical, senile, bilateral 03/19/2018  . Complication of anesthesia 2012   "irritated trachea" with cough x 1 year from intubation 2012.  smaller tube used with other surgeries  . Depression    just lost her son a month ago  . Esophageal hernia   . Fibromyalgia   . Headache    occasionally  . HTN (hypertension)   . Hx of colonic polyps   . Hydronephrosis of right kidney   . Hypercholesterolemia   . Hypertensive retinopathy of both eyes 03/19/2018  . Neuropathy due to medical condition ([place])    bilateral lower legs  . Obesity   . 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 Osteoporosis   . Renal angiomyolipoma   . Rheumatoid arthritis ([place])     Family History  Problem Relation Age of Onset  . Diabetes Father   . Osteoarthritis Father   . Heart disease Father   . Stroke Father   . Osteoarthritis Mother   . Stroke Mother   . Pneumonia Mother   . Colon cancer Sister 16  . Colon polyps Other        neice/nephew  . Diabetes Sister   . Diabetes Brother    Past Surgical History:  Procedure Laterality Date  . ABDOMINAL HYSTERECTOMY  1969  . BACK SURGERY  2012   rod in back-  lumbar disc removal with bone graft  . blephoroplasty     bilateral  . BREAST BIOPSY  1989  .  CATARACT EXTRACTION Bilateral    05/2020, 06/2020  . CATARACT EXTRACTION, BILATERAL    . CHOLECYSTECTOMY  2010  . COLONOSCOPY  2022   with polypectomy  . EXCISIONAL TOTAL KNEE ARTHROPLASTY WITH ANTIBIOTIC SPACERS Left 11/14/2016   Procedure: EXCISIONAL LEFT TOTAL KNEE ARTHROPLASTY WITH  PLACEMENT OF ANTIBIOTIC SPACERS;  Surgeon: [name], MD;  Location: [facility];  Service: Orthopedics;  Laterality: Left;  . JOINT REPLACEMENT    . NISSEN FUNDOPLICATION     . ORIF FEMUR FRACTURE Right 01/04/2020   Procedure: OPEN REDUCTION INTERNAL FIXATION (ORIF) DISTAL FEMUR FRACTURE;  Surgeon: Shona Needles, MD;  Location: [facility];  Service: Orthopedics;  Laterality: Right;  . PARAESOPHAGEAL HERNIA REPAIR  1989  . REVISION TOTAL KNEE ARTHROPLASTY Left 06/09/2017  . TONSILLECTOMY  1946  . TOTAL HIP ARTHROPLASTY Right 08/31/2012   Procedure: TOTAL HIP ARTHROPLASTY ANTERIOR APPROACH;  Surgeon: Mauri Pole, MD;  Location: [facility];  Service: Orthopedics;  Laterality: Right;  . TOTAL KNEE ARTHROPLASTY  2001   left  . TOTAL KNEE ARTHROPLASTY  2010   right  . TOTAL KNEE REVISION Left 06/09/2017   Procedure: LEFT REVISION KNEE ARTHROPLASTY;  Surgeon: [name], MD;  Location: [facility];  Service: Orthopedics;  Laterality: Left;   Social History   Social History Narrative   Patient lives at home alone.   Caffeine Use: 1 cup daily   Immunization History  Administered Date(s) Administered  . Fluad Quad(high Dose 65+) 04/26/2019, 05/08/2020  . Influenza, High Dose Seasonal PF 03/27/2018  . Influenza,inj,Quad PF,6+ Mos 03/13/2017  . Influenza-Unspecified 03/26/2018, 06/04/2020, 07/18/2021  . PFIZER(Purple Top)SARS-COV-2 Vaccination 09/02/2019, 09/30/2019, 05/08/2020  . Pneumococcal Conjugate-13 02/26/2018  . Pneumococcal Polysaccharide-23 05/17/2019     Objective: Vital Signs: There were no vitals taken for this visit.   Physical Exam   Musculoskeletal Exam: ***  CDAI Exam: CDAI Score: -- Patient Global: --; Provider Global: -- Swollen: --; Tender: -- Joint Exam 02/20/2022   No joint exam has been documented for this visit   There is currently no information documented on the homunculus. Go to the Rheumatology activity and complete the homunculus joint exam.  Investigation: No additional findings.  Imaging: No results found.  Recent Labs: Lab Results  Component Value Date   WBC 8.6 08/05/2021   HGB 13.2 08/05/2021   PLT  201.0 08/05/2021   NA 141 08/05/2021   K 4.6 08/05/2021   CL 104 08/05/2021   CO2 29 08/05/2021   GLUCOSE 69 (L) 08/05/2021   BUN 27 (H) 08/05/2021   CREATININE 1.33 (H) 08/05/2021   BILITOT 0.9 08/05/2021   ALKPHOS 100 08/05/2021   AST 21 08/05/2021   ALT 12 08/05/2021   PROT 7.2 08/05/2021   PROT 6.7 08/05/2021   ALBUMIN 4.1 08/05/2021   CALCIUM 9.6 08/05/2021   GFRAA 45 (L) 12/31/2020    Speciality Comments: PLQ Eye Exam: 08/30/2021 WNL @ [facility] Exam  f/u 1 year  Procedures:  No procedures performed Allergies: Ativan [lorazepam], Haldol [haloperidol], Epinephrine, Levofloxacin, Lipitor [atorvastatin], Robaxin [methocarbamol], and Trovan [alatrofloxacin]   Assessment / Plan:     Visit Diagnoses: No diagnosis found.  Orders: No orders of the defined types were placed in this encounter.  No orders of the defined types were placed in this encounter.   Face-to-face time spent with patient was *** minutes. Greater than 50% of time was spent in counseling and coordination of care.  Follow-Up Instructions: No follow-ups on file.   Marissa  C Graves, CMA  Note - This record has been created using Editor, commissioning.  Chart creation errors have been sought, but may not always  have been located. Such creation errors do not reflect on  the standard of medical care.

## 2022-02-20 ENCOUNTER — Ambulatory Visit: Payer: PPO | Attending: Rheumatology | Admitting: Rheumatology

## 2022-02-20 ENCOUNTER — Encounter: Payer: Self-pay | Admitting: Rheumatology

## 2022-02-20 VITALS — BP 164/80 | HR 60 | Resp 17 | Ht 67.0 in | Wt 206.0 lb

## 2022-02-20 DIAGNOSIS — Z96652 Presence of left artificial knee joint: Secondary | ICD-10-CM | POA: Diagnosis not present

## 2022-02-20 DIAGNOSIS — T8459XS Infection and inflammatory reaction due to other internal joint prosthesis, sequela: Secondary | ICD-10-CM | POA: Diagnosis not present

## 2022-02-20 DIAGNOSIS — I10 Essential (primary) hypertension: Secondary | ICD-10-CM

## 2022-02-20 DIAGNOSIS — M21372 Foot drop, left foot: Secondary | ICD-10-CM

## 2022-02-20 DIAGNOSIS — E785 Hyperlipidemia, unspecified: Secondary | ICD-10-CM

## 2022-02-20 DIAGNOSIS — M810 Age-related osteoporosis without current pathological fracture: Secondary | ICD-10-CM

## 2022-02-20 DIAGNOSIS — M5136 Other intervertebral disc degeneration, lumbar region: Secondary | ICD-10-CM

## 2022-02-20 DIAGNOSIS — Z79899 Other long term (current) drug therapy: Secondary | ICD-10-CM | POA: Diagnosis not present

## 2022-02-20 DIAGNOSIS — M797 Fibromyalgia: Secondary | ICD-10-CM

## 2022-02-20 DIAGNOSIS — Z8659 Personal history of other mental and behavioral disorders: Secondary | ICD-10-CM

## 2022-02-20 DIAGNOSIS — G9389 Other specified disorders of brain: Secondary | ICD-10-CM

## 2022-02-20 DIAGNOSIS — Z96651 Presence of right artificial knee joint: Secondary | ICD-10-CM | POA: Diagnosis not present

## 2022-02-20 DIAGNOSIS — Z8669 Personal history of other diseases of the nervous system and sense organs: Secondary | ICD-10-CM

## 2022-02-20 DIAGNOSIS — N183 Chronic kidney disease, stage 3 unspecified: Secondary | ICD-10-CM

## 2022-02-20 DIAGNOSIS — Z86010 Personal history of colonic polyps: Secondary | ICD-10-CM

## 2022-02-20 DIAGNOSIS — Z96641 Presence of right artificial hip joint: Secondary | ICD-10-CM | POA: Diagnosis not present

## 2022-02-20 DIAGNOSIS — Z96659 Presence of unspecified artificial knee joint: Secondary | ICD-10-CM

## 2022-02-20 DIAGNOSIS — Z13820 Encounter for screening for osteoporosis: Secondary | ICD-10-CM

## 2022-02-20 DIAGNOSIS — D1771 Benign lipomatous neoplasm of kidney: Secondary | ICD-10-CM

## 2022-02-20 DIAGNOSIS — S72451D Displaced supracondylar fracture without intracondylar extension of lower end of right femur, subsequent encounter for closed fracture with routine healing: Secondary | ICD-10-CM

## 2022-02-20 DIAGNOSIS — M0579 Rheumatoid arthritis with rheumatoid factor of multiple sites without organ or systems involvement: Secondary | ICD-10-CM | POA: Diagnosis not present

## 2022-02-20 MED ORDER — HYDROXYCHLOROQUINE SULFATE 200 MG PO TABS: 200.0000 mg | ORAL_TABLET | Freq: Every day | ORAL | 0 refills | Status: DC

## 2022-02-20 NOTE — Patient Instructions (Signed)
Vaccines You are taking a medication(s) that can suppress your immune system.  The following immunizations are recommended: Flu annually Covid-19  Td/Tdap (tetanus, diphtheria, pertussis) every 10 years Pneumonia (Prevnar 15 then Pneumovax 23 at least 1 year apart.  Alternatively, can take Prevnar 20 without needing additional dose) Shingrix: 2 doses from 4 weeks to 6 months apart  Please check with your PCP to make sure you are up to date.  

## 2022-02-21 ENCOUNTER — Other Ambulatory Visit: Payer: Self-pay | Admitting: *Deleted

## 2022-02-21 ENCOUNTER — Encounter: Payer: Self-pay | Admitting: *Deleted

## 2022-02-21 LAB — COMPLETE METABOLIC PANEL WITH GFR
AG Ratio: 1.3 (calc) (ref 1.0–2.5)
ALT: 15 U/L (ref 6–29)
AST: 23 U/L (ref 10–35)
Albumin: 3.9 g/dL (ref 3.6–5.1)
Alkaline phosphatase (APISO): 92 U/L (ref 37–153)
BUN/Creatinine Ratio: 15 (calc) (ref 6–22)
BUN: 18 mg/dL (ref 7–25)
CO2: 30 mmol/L (ref 20–32)
Calcium: 9.3 mg/dL (ref 8.6–10.4)
Chloride: 107 mmol/L (ref 98–110)
Creat: 1.22 mg/dL — ABNORMAL HIGH (ref 0.60–0.95)
Globulin: 2.9 g/dL (calc) (ref 1.9–3.7)
Glucose, Bld: 82 mg/dL (ref 65–99)
Potassium: 5 mmol/L (ref 3.5–5.3)
Sodium: 144 mmol/L (ref 135–146)
Total Bilirubin: 0.9 mg/dL (ref 0.2–1.2)
Total Protein: 6.8 g/dL (ref 6.1–8.1)
eGFR: 45 mL/min/{1.73_m2} — ABNORMAL LOW (ref >=60)

## 2022-02-21 LAB — CBC WITH DIFFERENTIAL/PLATELET
Absolute Monocytes: 591 cells/uL (ref 200–950)
Basophils Absolute: 58 cells/uL (ref 0–200)
Basophils Relative: 0.8 %
Eosinophils Absolute: 416 cells/uL (ref 15–500)
Eosinophils Relative: 5.7 %
HCT: 41.4 % (ref 35.0–45.0)
Hemoglobin: 13.5 g/dL (ref 11.7–15.5)
Lymphs Abs: 1518 cells/uL (ref 850–3900)
MCH: 28.9 pg (ref 27.0–33.0)
MCHC: 32.6 g/dL (ref 32.0–36.0)
MCV: 88.7 fL (ref 80.0–100.0)
MPV: 12.1 fL (ref 7.5–12.5)
Monocytes Relative: 8.1 %
Neutro Abs: 4716 cells/uL (ref 1500–7800)
Neutrophils Relative %: 64.6 %
Platelets: 188 10*3/uL (ref 140–400)
RBC: 4.67 10*6/uL (ref 3.80–5.10)
RDW: 12.4 % (ref 11.0–15.0)
Total Lymphocyte: 20.8 %
WBC: 7.3 10*3/uL (ref 3.8–10.8)

## 2022-02-21 NOTE — Patient Outreach (Signed)
  Care Coordination   Initial Visit Note   02/21/2022 Name: SHARMON CHERAMIE MRN: [id number] DOB: [birth year]  KENLEY RETTINGER is a 81 y.o. year old female who sees Leamon Arnt, MD for primary care. I spoke with  Terrilyn Saver by phone today  What matters to the patients health and wellness today?  No needs    Goals Addressed               This Visit's Progress     No Needs (pt-stated)        Care Coordination Interventions: Provided education to patient and/or caregiver about advanced directives Reviewed medications with patient and discussed Purpose of all medications Reviewed scheduled/upcoming provider appointments including pending appointments and verified AWV completed 4/3 2023 with next scheduled AWV on 10/20/2022 '@1200'$  Screening for signs and symptoms of depression related to chronic disease state  Assessed social determinant of health barriers          SDOH assessments and interventions completed:  Yes  SDOH Interventions Today    Flowsheet Row Most Recent Value  SDOH Interventions   Food Insecurity Interventions Intervention Not Indicated  Housing Interventions Intervention Not Indicated  Transportation Interventions Intervention Not Indicated        Care Coordination Interventions Activated:  Yes  Care Coordination Interventions:  Yes, provided   Follow up plan: No further intervention required.   Encounter Outcome:  Pt. Visit Completed   Raina Mina, RN Care Management Coordinator [place] Office [phone]

## 2022-02-21 NOTE — Progress Notes (Signed)
CBC is normal, creatinine is elevated and stable.

## 2022-02-21 NOTE — Patient Instructions (Signed)
Visit Information  Thank you for taking time to visit with me today. Please don't hesitate to contact me if I can be of assistance to you.   Following are the goals we discussed today:   Goals Addressed               This Visit's Progress     No Needs (pt-stated)        Care Coordination Interventions: Provided education to patient and/or caregiver about advanced directives Reviewed medications with patient and discussed Purpose of all medications Reviewed scheduled/upcoming provider appointments including pending appointments and verified AWV completed 4/3 2023 with next scheduled AWV on 10/20/2022 '@1200'$  Screening for signs and symptoms of depression related to chronic disease state  Assessed social determinant of health barriers            Please call the care guide team at [phone] if you need to cancel or reschedule your appointment.   If you are experiencing a Mental Health or [place] or need someone to talk to, please call the Suicide and Crisis Lifeline: 988 call the [place] National Suicide Prevention Lifeline: (662)885-4700 or TTY: (236) 469-9619 TTY (410)187-7256) to talk to a trained counselor call 1-800-273-TALK (toll free, 24 hour hotline)  Patient verbalizes understanding of instructions and care plan provided today and agrees to view in [place]. Active MyChart status and patient understanding of how to access instructions and care plan via MyChart confirmed with patient.     No further follow up required: No needs at this time  Raina Mina, RN Care Management Coordinator [place] Office [phone]

## 2022-03-31 ENCOUNTER — Encounter: Payer: Self-pay | Admitting: *Deleted

## 2022-04-27 ENCOUNTER — Other Ambulatory Visit: Payer: Self-pay | Admitting: Family Medicine

## 2022-04-27 DIAGNOSIS — F329 Major depressive disorder, single episode, unspecified: Secondary | ICD-10-CM

## 2022-05-10 ENCOUNTER — Other Ambulatory Visit: Payer: Self-pay | Admitting: Family Medicine

## 2022-05-10 DIAGNOSIS — E785 Hyperlipidemia, unspecified: Secondary | ICD-10-CM

## 2022-05-19 ENCOUNTER — Ambulatory Visit (INDEPENDENT_AMBULATORY_CARE_PROVIDER_SITE_OTHER): Payer: PPO | Admitting: Family Medicine

## 2022-05-19 ENCOUNTER — Encounter: Payer: Self-pay | Admitting: Family Medicine

## 2022-05-19 VITALS — BP 158/60 | HR 61 | Temp 98.1°F | Ht 67.0 in | Wt 207.6 lb

## 2022-05-19 DIAGNOSIS — Z1231 Encounter for screening mammogram for malignant neoplasm of breast: Secondary | ICD-10-CM | POA: Diagnosis not present

## 2022-05-19 DIAGNOSIS — I10 Essential (primary) hypertension: Secondary | ICD-10-CM

## 2022-05-19 DIAGNOSIS — D631 Anemia in chronic kidney disease: Secondary | ICD-10-CM

## 2022-05-19 DIAGNOSIS — N189 Chronic kidney disease, unspecified: Secondary | ICD-10-CM

## 2022-05-19 DIAGNOSIS — E785 Hyperlipidemia, unspecified: Secondary | ICD-10-CM

## 2022-05-19 DIAGNOSIS — N183 Chronic kidney disease, stage 3 unspecified: Secondary | ICD-10-CM | POA: Diagnosis not present

## 2022-05-19 DIAGNOSIS — M810 Age-related osteoporosis without current pathological fracture: Secondary | ICD-10-CM

## 2022-05-19 DIAGNOSIS — Z0000 Encounter for general adult medical examination without abnormal findings: Secondary | ICD-10-CM

## 2022-05-19 DIAGNOSIS — M0579 Rheumatoid arthritis with rheumatoid factor of multiple sites without organ or systems involvement: Secondary | ICD-10-CM | POA: Diagnosis not present

## 2022-05-19 MED ORDER — LOSARTAN POTASSIUM 25 MG PO TABS: 25.0000 mg | ORAL_TABLET | Freq: Every day | ORAL | 3 refills | Status: DC

## 2022-05-19 NOTE — Patient Instructions (Signed)
Please return in 2-3 weeks to recheck blood pressure and kidney function  I will release your lab results to you on your MyChart account with further instructions. You may see the results before I do, but when I review them I will send you a message with my report or have my assistant call you if things need to be discussed. Please reply to my message with any questions. Thank you!   Start the new blood pressure pill, losartan '25mg'$  daily along with the atenolol. Keep monitoring your blood pressure at home.   If you have any questions or concerns, please don't hesitate to send me a message via MyChart or call the office at [phone]. Thank you for visiting with [place] today! It's our pleasure caring for you.

## 2022-05-19 NOTE — Progress Notes (Unsigned)
Subjective  Chief Complaint  Patient presents with   Annual Exam    Pt here for Annual exam and is     HPI: Laura Chambers is a 81 y.o. female who presents to [facility] today for a Female Wellness Visit. She also has the concerns and/or needs as listed above in the chief complaint. These will be addressed in addition to the Health Maintenance Visit.   Wellness Visit: annual visit with health maintenance review and exam without Pap  HM: needs mammogram to f/u abnl last year.  Overall doing well.  Reviewed notes from ent and NS Chronic disease f/u and/or acute problem visit: (deemed necessary to be done in addition to the wellness visit): Brain mass: monitoring closely per NS and ENT HTN, RA, CKD and anemia: due for lab f/u. No cp or sob. Uses lasix as needed for leg edema On statin for lipids. Due for recheck.   Assessment  1. Annual physical exam   2. Osteoporosis, post-menopausal   3. Essential hypertension   4. Chronic kidney disease (CKD), active medical management without dialysis, stage 3 (moderate)   5. Rheumatoid arthritis involving multiple sites with positive rheumatoid factor (HCC)   6. Anemia associated with chronic renal failure   7. Dyslipidemia   8. Encounter for screening mammogram for breast cancer      Plan  Female Wellness Visit: Age appropriate Health Maintenance and Prevention measures were discussed with patient. Included topics are cancer screening recommendations, ways to keep healthy (see AVS) including dietary and exercise recommendations, regular eye and dental care, use of seat belts, and avoidance of moderate alcohol use and tobacco use.  BMI: discussed patient's BMI and encouraged positive lifestyle modifications to help get to or maintain a target BMI. HM needs and immunizations were addressed and ordered. See below for orders. See HM and immunization section for updates. Routine labs and screening tests ordered  including cmp, cbc and lipids where appropriate. Discussed recommendations regarding Vit D and calcium supplementation (see AVS)  Chronic disease management visit and/or acute problem visit: Check labs: lipids on crestor 10. Check lft Pt to schedule mammogram Monitor renal funtion and cbc Follow along with ent/ns for brain mass. Cliniclly stable RA stable on plaqenil.  Monitor bp : add losartan 25 and return in 2 weeks to recheck renal fxn on arb. Continue atenolol 50 daily  Follow up: 2 weeks to recheck bp and bmp  Orders Placed This Encounter  Procedures   MM DIGITAL SCREENING BILATERAL   Lipid panel   TSH   Comprehensive metabolic panel   Meds ordered this encounter  Medications   losartan (COZAAR) 25 MG tablet    Sig: Take 1 tablet (25 mg total) by mouth daily.    Dispense:  90 tablet    Refill:  3      Body mass index is 32.51 kg/m. Wt Readings from Last 3 Encounters:  05/19/22 207 lb 9.6 oz (94.2 kg)  02/20/22 206 lb (93.4 kg)  08/05/21 201 lb 12.8 oz (91.5 kg)     Patient Active Problem List   Diagnosis Date Noted   Rheumatoid arthritis involving multiple sites with positive rheumatoid factor ([place]) 10/13/2018    Priority: High   Chronic kidney disease (CKD), active medical management without dialysis, stage 3 (moderate) 12/15/2017    Priority: High    6/29-01/11/2020 AKI superimposed on CKD due to hypotension and hypovolemia in the context of surgical repair of periprosthetic knee fracture  Creatinine up to 2.03 and GFR down to 23 Creatinine 1.17/GFR 44 at discharge    Fibromyalgia 12/15/2017    Priority: High   Reactive depression (situational) 08/17/2017    Priority: High   Essential hypertension 12/08/2016    Priority: High   Peripheral neuropathy 01/12/2014    Priority: High   Dyslipidemia 06/16/2012    Priority: High   Anemia associated with chronic renal failure 05/16/2020    Priority: Medium    History of femur fracture supracondylar 2021, right  01/03/2020    Priority: Medium    Hx of colonic polyps 12/15/2017    Priority: Medium    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    Priority: Medium    DDD (degenerative disc disease), lumbar 12/15/2017    Priority: Medium     Status post discectomy by Dr. [place] Gammon 2012    Chronic constipation 01/12/2020    Priority: Low   History of total right hip replacement 12/15/2017    Priority: Low    2014 Dr. [name]    S/P total knee replacement, right 12/15/2017    Priority: Low    2010 Dr. Maureen Ralphs    Left foot drop 12/15/2017    Priority: Low   Status post revision of total knee, left 06/09/2017    Priority: Low   Urine retention 12/02/2016    Priority: Low   Osteoporosis, post-menopausal 06/24/2021    DEXA 02/2021: lowest T = -3.1 left radius; rec fosamax: to discuss at jan 2023 f/u visit.     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     6/29-01/11/2020 right periprosthetic knee fracture sustained in a mechanical fall in the context of foot drop and arch dysfunction. 6/30 open reduction internal fixation of the right distal femoral fracture by Dr. Doreatha Martin. Lovenox DVT prophylaxis postop.    Health Maintenance  Topic Date Due   Zoster Vaccines- Shingrix (1 of 2) Never done   COVID-19 Vaccine (4 - Pfizer risk series) 06/04/2022 (Originally 07/03/2020)   Medicare Annual Wellness (AWV)  10/08/2022   DEXA SCAN  02/09/2023   COLONOSCOPY (Pts 45-57yr Insurance coverage will need to be confirmed)  04/03/2026   Pneumonia Vaccine 81 Years old  Completed   INFLUENZA VACCINE  Completed   HPV VACCINES  Aged Out   Immunization History  Administered Date(s) Administered   Fluad Quad(high Dose 65+) 04/26/2019, 05/08/2020, 04/02/2022   Influenza, High Dose Seasonal PF 03/27/2018   Influenza,inj,Quad PF,6+ Mos 03/13/2017   Influenza-Unspecified 03/26/2018, 06/04/2020, 07/18/2021   PFIZER(Purple Top)SARS-COV-2 Vaccination 09/02/2019, 09/30/2019, 05/08/2020   Pneumococcal  Conjugate-13 02/26/2018   Pneumococcal Polysaccharide-23 05/17/2019   We updated and reviewed the patient's past history in detail and it is documented below. Allergies: Patient is allergic to ativan [lorazepam], haldol [haloperidol], epinephrine, levofloxacin, lipitor [atorvastatin], robaxin [methocarbamol], and trovan [alatrofloxacin]. Past Medical History Patient  has a past medical history of Anemia, Anxiety, Arthritis, Atelectasis, left, Blood transfusion without reported diagnosis, Cataract cortical, senile, bilateral (038/18/2993, Complication of anesthesia (2012), Depression, Esophageal hernia, Fibromyalgia, Headache, HTN (hypertension), colonic polyps, Hydronephrosis of right kidney, Hypercholesterolemia, Hypertensive retinopathy of both eyes (03/19/2018), Neuropathy due to medical condition ([place], Obesity, Osteoarthritis (arthritis due to wear and tear of joints), Osteoporosis, Renal angiomyolipoma, and Rheumatoid arthritis ([place]. Past Surgical History Patient  has a past surgical history that includes Total knee arthroplasty (2001); Cholecystectomy (2010); Tonsillectomy (1946); Abdominal hysterectomy (1969); Paraesophageal hernia repair (1989); Breast biopsy (1989); Nissen fundoplication; Total knee arthroplasty (2010); blephoroplasty; Back surgery (2012); Total hip arthroplasty (Right, 08/31/2012);  Excisional total knee arthroplasty with antibiotic spacers (Left, 11/14/2016); Revision total knee arthroplasty (Left, 06/09/2017); Total knee revision (Left, 06/09/2017); Joint replacement; ORIF femur fracture (Right, 01/04/2020); Cataract extraction, bilateral; Cataract extraction (Bilateral); and Colonoscopy (2022). Family History: Patient family history includes Colon cancer (age of onset: 68) in her sister; Colon polyps in an other family member; Diabetes in her brother, father, and sister; Heart disease in her father; Osteoarthritis in her father and mother; Pneumonia in her mother; Stroke in  her father and mother. Social History:  Patient  reports that she has never smoked. She has been exposed to tobacco smoke. She has never used smokeless tobacco. She reports that she does not drink alcohol and does not use drugs.  Review of Systems: Constitutional: negative for fever or malaise Ophthalmic: negative for photophobia, double vision or loss of vision Cardiovascular: negative for chest pain, dyspnea on exertion, or new LE swelling Respiratory: negative for SOB or persistent cough Gastrointestinal: negative for abdominal pain, change in bowel habits or melena Genitourinary: negative for dysuria or gross hematuria, no abnormal uterine bleeding or disharge Musculoskeletal: negative for new gait disturbance or muscular weakness Integumentary: negative for new or persistent rashes, no breast lumps Neurological: negative for TIA or stroke symptoms Psychiatric: negative for SI or delusions Allergic/Immunologic: negative for hives  Patient Care Team    Relationship Specialty Notifications Start End  Leamon Arnt, MD PCP - General Family Medicine  05/11/19   Paralee Cancel, MD Consulting Physician Orthopedic Surgery  04/03/17   Hortencia Pilar, MD Consulting Physician Ophthalmology  04/01/18   Bo Merino, MD Consulting Physician Rheumatology  04/08/18   Lisabeth Pick, MD Consulting Physician Ophthalmology  06/26/21   Elliot Gault, MD Referring Physician Neurosurgery  08/06/21   Melvenia Beam, MD Consulting Physician Neurology  11/11/21     Objective  Vitals: BP (!) 158/60   Pulse 61   Temp 98.1 F (36.7 C)   Ht '5\' 7"'$  (1.702 m)   Wt 207 lb 9.6 oz (94.2 kg)   SpO2 95%   BMI 32.51 kg/m  General:  Well developed, well nourished, no acute distress  Psych:  Alert and orientedx3,normal mood and affect HEENT:  Normocephalic, atraumatic, non-icteric sclera,  supple neck without adenopathy, mass or thyromegaly Cardiovascular:  Normal S1, S2, RRR without gallop, rub or  murmur Respiratory:  Good breath sounds bilaterally, CTAB with normal respiratory effort Gastrointestinal: normal bowel sounds, soft, non-tender, no noted masses. No HSM Tr edema Commons side effects, risks, benefits, and alternatives for medications and treatment plan prescribed today were discussed, and the patient expressed understanding of the given instructions. Patient is instructed to call or message via MyChart if he/she has any questions or concerns regarding our treatment plan. No barriers to understanding were identified. We discussed Red Flag symptoms and signs in detail. Patient expressed understanding regarding what to do in case of urgent or emergency type symptoms.  Medication list was reconciled, printed and provided to the patient in AVS. Patient instructions and summary information was reviewed with the patient as documented in the AVS. This note was prepared with assistance of Dragon voice recognition software. Occasional wrong-word or sound-a-like substitutions may have occurred due to the inherent limitations of voice recognition software

## 2022-05-20 LAB — LIPID PANEL
Cholesterol: 119 mg/dL (ref 0–200)
HDL: 47.6 mg/dL (ref >39.00)
LDL Cholesterol: 48 mg/dL (ref 0–99)
NonHDL: 71.87
Total CHOL/HDL Ratio: 3
Triglycerides: 119 mg/dL (ref 0.0–149.0)
VLDL: 23.8 mg/dL (ref 0.0–40.0)

## 2022-05-20 LAB — COMPREHENSIVE METABOLIC PANEL
ALT: 11 U/L (ref 0–35)
AST: 22 U/L (ref 0–37)
Albumin: 3.9 g/dL (ref 3.5–5.2)
Alkaline Phosphatase: 87 U/L (ref 39–117)
BUN: 19 mg/dL (ref 6–23)
CO2: 32 mEq/L (ref 19–32)
Calcium: 9.4 mg/dL (ref 8.4–10.5)
Chloride: 103 mEq/L (ref 96–112)
Creatinine, Ser: 1.19 mg/dL (ref 0.40–1.20)
GFR: 42.73 mL/min — ABNORMAL LOW (ref >60.00)
Glucose, Bld: 87 mg/dL (ref 70–99)
Potassium: 5 mEq/L (ref 3.5–5.1)
Sodium: 141 mEq/L (ref 135–145)
Total Bilirubin: 0.8 mg/dL (ref 0.2–1.2)
Total Protein: 7.2 g/dL (ref 6.0–8.3)

## 2022-05-20 LAB — TSH: TSH: 1.79 u[IU]/mL (ref 0.35–5.50)

## 2022-06-05 ENCOUNTER — Ambulatory Visit (INDEPENDENT_AMBULATORY_CARE_PROVIDER_SITE_OTHER): Payer: PPO | Admitting: Family Medicine

## 2022-06-05 ENCOUNTER — Encounter: Payer: Self-pay | Admitting: Family Medicine

## 2022-06-05 VITALS — BP 142/70 | HR 63 | Temp 98.3°F | Ht 67.0 in | Wt 206.2 lb

## 2022-06-05 DIAGNOSIS — M810 Age-related osteoporosis without current pathological fracture: Secondary | ICD-10-CM

## 2022-06-05 DIAGNOSIS — N183 Chronic kidney disease, stage 3 unspecified: Secondary | ICD-10-CM | POA: Diagnosis not present

## 2022-06-05 DIAGNOSIS — R519 Headache, unspecified: Secondary | ICD-10-CM

## 2022-06-05 DIAGNOSIS — G939 Disorder of brain, unspecified: Secondary | ICD-10-CM | POA: Diagnosis not present

## 2022-06-05 DIAGNOSIS — I10 Essential (primary) hypertension: Secondary | ICD-10-CM | POA: Diagnosis not present

## 2022-06-05 DIAGNOSIS — G500 Trigeminal neuralgia: Secondary | ICD-10-CM

## 2022-06-05 MED ORDER — ATENOLOL 50 MG PO TABS: 50.0000 mg | ORAL_TABLET | Freq: Two times a day (BID) | ORAL | 3 refills | Status: DC

## 2022-06-05 NOTE — Progress Notes (Signed)
Subjective  CC:  Chief Complaint  Patient presents with   Hypertension    Pt her to F/U with Bp and kidney function    HPI: Laura Chambers is a 81 y.o. female who presents to the office today to address the problems listed above in the chief complaint. Hypertension f/u: bp was elevated and I added losartan 25 last visit, however pt took 3 doses and it made her dizzy. She reports that she has had several medications for bp in the past that she could not tolerate well. So shis has recently (past 3-4 days) been taking tenormin 50 twice a day instead daily. She reports this has helped and she has tolerated it. Control is fair . Pt reports she is doing well. She denies adverse effects from his BP medications. Compliance with medication is good.  CKD: improving by recent labs.  Reviewed osteoporosis dexa: wrist only. -3.1, spine and hip -1.4 and -1.0. 2022 Reviewed recent telephone note ent: pt called reporting severe right neck and facial headache described as sharp and stabbing and "nerve like". Felt similar to Trigeminal neuralgia pain: dxd with this 8-9 years ago by report. Sxs lasted all night. Now she is pain free. Never had loss of vision, photophobia, n/v, jaw claudication or rash. No paresis or dysarthria. Of note, her diplopia is back from her brain lesion.  Brain lesion: has MRI/CT next week and f/u with NS and ENT soon.   Assessment  1. Essential hypertension   2. Chronic kidney disease (CKD), active medical management without dialysis, stage 3 (moderate)   3. Osteoporosis, post-menopausal   4. Right-sided headache   5. Trigeminal neuralgia of right side of face   6. Brain lesion      Plan   Hypertension f/u: BP control is fairly well controlled. Will increase tenormin to 50 bid and watch HR; discussed monitoring for bradycardia. She checks bp at home nightly and will send me numbers.  CKD: stable. Didn't tolerate ARB Osteoporosis at wrist only: will recheck dexa 2024   Headache: ? TN flare. Resolved. Will monitor. To ER for severe or neurologic sxs. Has brain imaging for f/u upcoming.  Brain lesion  Education regarding management of these chronic disease states was given. Management strategies discussed on successive visits include dietary and exercise recommendations, goals of achieving and maintaining IBW, and lifestyle modifications aiming for adequate sleep and minimizing stressors.   Follow up: 3 mo to recheck bp  No orders of the defined types were placed in this encounter.  Meds ordered this encounter  Medications   atenolol (TENORMIN) 50 MG tablet    Sig: Take 1 tablet (50 mg total) by mouth 2 (two) times daily.    Dispense:  90 tablet    Refill:  3      BP Readings from Last 3 Encounters:  06/05/22 (!) 142/70  05/19/22 (!) 158/60  02/20/22 (!) 164/80   Wt Readings from Last 3 Encounters:  06/05/22 206 lb 3.2 oz (93.5 kg)  05/19/22 207 lb 9.6 oz (94.2 kg)  02/20/22 206 lb (93.4 kg)    Lab Results  Component Value Date   CHOL 119 05/19/2022   CHOL 118 01/15/2021   CHOL 141 05/16/2020   Lab Results  Component Value Date   HDL 47.60 05/19/2022   HDL 49.40 01/15/2021   HDL 45 (L) 05/16/2020   Lab Results  Component Value Date   LDLCALC 48 05/19/2022   LDLCALC 44 01/15/2021   LDLCALC 70 05/16/2020  Lab Results  Component Value Date   TRIG 119.0 05/19/2022   TRIG 123.0 01/15/2021   TRIG 190 (H) 05/16/2020   Lab Results  Component Value Date   CHOLHDL 3 05/19/2022   CHOLHDL 2 01/15/2021   CHOLHDL 3.1 05/16/2020   Lab Results  Component Value Date   LDLDIRECT 58.0 08/17/2017   Lab Results  Component Value Date   CREATININE 1.19 05/19/2022   BUN 19 05/19/2022   NA 141 05/19/2022   K 5.0 05/19/2022   CL 103 05/19/2022   CO2 32 05/19/2022    The ASCVD Risk score (Arnett DK, et al., 2019) failed to calculate for the following reasons:   The 2019 ASCVD risk score is only valid for ages 7 to 34  I reviewed  the patients updated PMH, FH, and SocHx.    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    Priority: High   Rheumatoid arthritis involving multiple sites with positive rheumatoid factor ([place]) 10/13/2018    Priority: High   Chronic kidney disease (CKD), active medical management without dialysis, stage 3 (moderate) 12/15/2017    Priority: High   Fibromyalgia 12/15/2017    Priority: High   Reactive depression (situational) 08/17/2017    Priority: High   Essential hypertension 12/08/2016    Priority: High   Peripheral neuropathy 01/12/2014    Priority: High   Dyslipidemia 06/16/2012    Priority: High   Anemia associated with chronic renal failure 05/16/2020    Priority: Medium    History of femur fracture supracondylar 2021, right 01/03/2020    Priority: Medium    Hx of colonic polyps 12/15/2017    Priority: Medium    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    Priority: Medium    DDD (degenerative disc disease), lumbar 12/15/2017    Priority: Medium    Chronic constipation 01/12/2020    Priority: Low   History of total right hip replacement 12/15/2017    Priority: Low   S/P total knee replacement, right 12/15/2017    Priority: Low   Left foot drop 12/15/2017    Priority: Low   Status post revision of total knee, left 06/09/2017    Priority: Low   Urine retention 12/02/2016    Priority: Low   Brain lesion 06/05/2022   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 She has been exposed to tobacco smoke. She has never used smokeless tobacco. She reports that she does not drink alcohol and does not use drugs.  Current Meds  Medication  Sig   Calcium Carbonate-Vitamin D (CALCIUM 600+D) 600-400 MG-UNIT tablet Take 1 tablet by mouth daily.    FLUoxetine (PROZAC) 20 MG capsule TAKE 1 CAPSULE BY MOUTH EVERY DAY   folic acid (FOLVITE) 562 MCG tablet Take 800 mcg daily by mouth.    furosemide (LASIX) 40 MG tablet Take 1 tablet (40 mg total) by mouth daily as needed. As needed for weight gain >/= 3 pounds. Notify provider if meds given.   gabapentin (NEURONTIN) 300 MG capsule TAKE 1 CAPSULE BY MOUTH TWICE A DAY   Ginger, Zingiber officinalis, (GINGER ROOT PO) Take by mouth.   hydroxychloroquine (PLAQUENIL) 200 MG tablet Take 1 tablet (200 mg total) by mouth daily.  Multiple Vitamin (MULTIVITAMIN WITH MINERALS) TABS tablet Take 1 tablet by mouth daily.    Omega-3 1000 MG CAPS Take 1 capsule by mouth daily.    rosuvastatin (CRESTOR) 10 MG tablet TAKE 1 TABLET BY MOUTH EVERY DAY   tamsulosin (FLOMAX) 0.4 MG CAPS capsule Take 1 capsule (0.4 mg total) by mouth as needed. Give after same meal each day. Do not open, crush or chew tabs.   TART CHERRY PO Take by mouth.   Turmeric (QC TUMERIC COMPLEX PO) Take by mouth.   [DISCONTINUED] atenolol (TENORMIN) 50 MG tablet TAKE 1 TABLET BY MOUTH EVERY DAY   [DISCONTINUED] losartan (COZAAR) 25 MG tablet Take 1 tablet (25 mg total) by mouth daily.    Review of Systems: Cardiovascular: negative for chest pain, palpitations, leg swelling, orthopnea Respiratory: negative for SOB, wheezing or persistent cough Gastrointestinal: negative for abdominal pain Genitourinary: negative for dysuria or gross hematuria  Objective  Vitals: BP (!) 142/70 Comment: home reading last night  Pulse 63   Temp 98.3 F (36.8 C)   Ht '5\' 7"'$  (1.702 m)   Wt 206 lb 3.2 oz (93.5 kg)   SpO2 96%   BMI 32.30 kg/m  General: no acute distress  Psych:  Alert and oriented, normal mood and affect HEENT:  Normocephalic, atraumatic, supple neck , no temporal ttp, no rash Cardiovascular:  RRR without murmur. no  edema Respiratory:  Good breath sounds bilaterally, CTAB with normal respiratory effort Skin:  Warm, no rashes Neurologic:   Mental status is normal Commons side effects, risks, benefits, and alternatives for medications and treatment plan prescribed today were discussed, and the patient expressed understanding of the given instructions. Patient is instructed to call or message via MyChart if he/she has any questions or concerns regarding our treatment plan. No barriers to understanding were identified. We discussed Red Flag symptoms and signs in detail. Patient expressed understanding regarding what to do in case of urgent or emergency type symptoms.  Medication list was reconciled, printed and provided to the patient in AVS. Patient instructions and summary information was reviewed with the patient as documented in the AVS. This note was prepared with assistance of Dragon voice recognition software. Occasional wrong-word or sound-a-like substitutions may have occurred due to the inherent limitation

## 2022-06-05 NOTE — Patient Instructions (Signed)
Please return in 3 months to recheck blood pressures;  Please send me your readings in a few weeks.   If you have any questions or concerns, please don't hesitate to send me a message via MyChart or call the office at [phone]. Thank you for visiting with [place] today! It's our pleasure caring for you.

## 2022-06-13 DIAGNOSIS — R519 Headache, unspecified: Secondary | ICD-10-CM | POA: Diagnosis not present

## 2022-06-13 DIAGNOSIS — H532 Diplopia: Secondary | ICD-10-CM | POA: Diagnosis not present

## 2022-06-13 DIAGNOSIS — C801 Malignant (primary) neoplasm, unspecified: Secondary | ICD-10-CM | POA: Diagnosis not present

## 2022-06-13 DIAGNOSIS — C7951 Secondary malignant neoplasm of bone: Secondary | ICD-10-CM | POA: Diagnosis not present

## 2022-06-16 DIAGNOSIS — M899 Disorder of bone, unspecified: Secondary | ICD-10-CM | POA: Diagnosis not present

## 2022-06-16 DIAGNOSIS — H4912 Fourth [trochlear] nerve palsy, left eye: Secondary | ICD-10-CM | POA: Diagnosis not present

## 2022-06-16 DIAGNOSIS — H4922 Sixth [abducent] nerve palsy, left eye: Secondary | ICD-10-CM | POA: Diagnosis not present

## 2022-07-11 NOTE — Progress Notes (Deleted)
Office Visit Note  Patient: Laura Chambers             Date of Birth: [birth year]           MRN: [id number]             PCP: Leamon Arnt, MD Referring: Leamon Arnt, MD Visit Date: 07/24/2022 Occupation: @GUAROCC$ @  Subjective:  No chief complaint on file.   History of Present Illness: Laura Chambers is a 82 y.o. female ***     Activities of Daily Living:  Patient reports morning stiffness for *** {minute/hour:19697}.   Patient {ACTIONS;DENIES/REPORTS:21021675::"Denies"} nocturnal pain.  Difficulty dressing/grooming: {ACTIONS;DENIES/REPORTS:21021675::"Denies"} Difficulty climbing stairs: {ACTIONS;DENIES/REPORTS:21021675::"Denies"} Difficulty getting out of chair: {ACTIONS;DENIES/REPORTS:21021675::"Denies"} Difficulty using hands for taps, buttons, cutlery, and/or writing: {ACTIONS;DENIES/REPORTS:21021675::"Denies"}  No Rheumatology ROS completed.   PMFS History:  Patient Active Problem List   Diagnosis Date Noted  . Brain lesion 06/05/2022  . Osteoporosis, post-menopausal 06/24/2021  . Anemia associated with chronic renal failure 05/16/2020  . Chronic constipation 01/12/2020  . History of femur fracture supracondylar 2021, right 01/03/2020  . 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 Rheumatoid arthritis involving multiple sites with positive rheumatoid factor ([place]) 10/13/2018  . Hx of colonic polyps 12/15/2017  . 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 Fibromyalgia 12/15/2017  . History of total right hip replacement 12/15/2017  . DDD (degenerative disc disease), lumbar 12/15/2017  . 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 Left foot drop 12/15/2017  . 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 Status post revision of total knee, left 06/09/2017  . Essential hypertension 12/08/2016  . Urine retention 12/02/2016   . Trigeminal neuralgia of right side of face 08/19/2015  . Peripheral neuropathy 01/12/2014  . Dyslipidemia 06/16/2012    Past Medical History:  Diagnosis Date  . Anemia   . Anxiety   . Arthritis   . Atelectasis, left    left lower lung  . Blood transfusion without reported diagnosis   . Cataract cortical, senile, bilateral 03/19/2018  . Complication of anesthesia 2012   "irritated trachea" with cough x 1 year from intubation 2012.  smaller tube used with other surgeries  . Depression    just lost her son a month ago  . Esophageal hernia   . Fibromyalgia   . Headache    occasionally  . HTN (hypertension)   . Hx of colonic polyps   . Hydronephrosis of right kidney   . Hypercholesterolemia   . Hypertensive retinopathy of both eyes 03/19/2018  . Neuropathy due to medical condition ([place])    bilateral lower legs  . Obesity   . 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 Osteoporosis   . Renal angiomyolipoma   . Rheumatoid arthritis ([place])     Family History  Problem Relation Age of Onset  . Diabetes Father   . Osteoarthritis Father   . Heart disease Father   . Stroke Father   . Osteoarthritis Mother   . Stroke Mother   . Pneumonia Mother   . Colon cancer Sister 91  . Colon polyps Other        neice/nephew  . Diabetes Sister   . Diabetes Brother    Past Surgical History:  Procedure Laterality Date  . ABDOMINAL HYSTERECTOMY  1969  . BACK SURGERY  2012   rod in back-  lumbar disc removal  with bone graft  . blephoroplasty     bilateral  . BREAST BIOPSY  1989  . CATARACT EXTRACTION Bilateral    05/2020, 06/2020  . CATARACT EXTRACTION, BILATERAL    . CHOLECYSTECTOMY  2010  . COLONOSCOPY  2022   with polypectomy  . EXCISIONAL TOTAL KNEE ARTHROPLASTY WITH ANTIBIOTIC SPACERS Left 11/14/2016   Procedure: EXCISIONAL LEFT TOTAL KNEE ARTHROPLASTY WITH  PLACEMENT OF ANTIBIOTIC SPACERS;  Surgeon: [name], MD;  Location: [facility];  Service:  Orthopedics;  Laterality: Left;  . JOINT REPLACEMENT    . NISSEN FUNDOPLICATION    . ORIF FEMUR FRACTURE Right 01/04/2020   Procedure: OPEN REDUCTION INTERNAL FIXATION (ORIF) DISTAL FEMUR FRACTURE;  Surgeon: Shona Needles, MD;  Location: [facility];  Service: Orthopedics;  Laterality: Right;  . PARAESOPHAGEAL HERNIA REPAIR  1989  . REVISION TOTAL KNEE ARTHROPLASTY Left 06/09/2017  . TONSILLECTOMY  1946  . TOTAL HIP ARTHROPLASTY Right 08/31/2012   Procedure: TOTAL HIP ARTHROPLASTY ANTERIOR APPROACH;  Surgeon: Mauri Pole, MD;  Location: [facility];  Service: Orthopedics;  Laterality: Right;  . TOTAL KNEE ARTHROPLASTY  2001   left  . TOTAL KNEE ARTHROPLASTY  2010   right  . TOTAL KNEE REVISION Left 06/09/2017   Procedure: LEFT REVISION KNEE ARTHROPLASTY;  Surgeon: [name], MD;  Location: [facility];  Service: Orthopedics;  Laterality: Left;   Social History   Social History Narrative   Patient lives at home alone.   Caffeine Use: 1 cup daily   Immunization History  Administered Date(s) Administered  . Fluad Quad(high Dose 65+) 04/26/2019, 05/08/2020, 04/02/2022  . Influenza, High Dose Seasonal PF 03/27/2018  . Influenza,inj,Quad PF,6+ Mos 03/13/2017  . Influenza-Unspecified 03/26/2018, 06/04/2020, 07/18/2021  . PFIZER(Purple Top)SARS-COV-2 Vaccination 09/02/2019, 09/30/2019, 05/08/2020  . Pneumococcal Conjugate-13 02/26/2018  . Pneumococcal Polysaccharide-23 05/17/2019     Objective: Vital Signs: There were no vitals taken for this visit.   Physical Exam   Musculoskeletal Exam: ***  CDAI Exam: CDAI Score: -- Patient Global: --; Provider Global: -- Swollen: --; Tender: -- Joint Exam 07/24/2022   No joint exam has been documented for this visit   There is currently no information documented on the homunculus. Go to the Rheumatology activity and complete the homunculus joint exam.  Investigation: No additional findings.  Imaging: No results found.  Recent  Labs: Lab Results  Component Value Date   WBC 7.3 02/20/2022   HGB 13.5 02/20/2022   PLT 188 02/20/2022   NA 141 05/19/2022   K 5.0 05/19/2022   CL 103 05/19/2022   CO2 32 05/19/2022   GLUCOSE 87 05/19/2022   BUN 19 05/19/2022   CREATININE 1.19 05/19/2022   BILITOT 0.8 05/19/2022   ALKPHOS 87 05/19/2022   AST 22 05/19/2022   ALT 11 05/19/2022   PROT 7.2 05/19/2022   ALBUMIN 3.9 05/19/2022   CALCIUM 9.4 05/19/2022   GFRAA 45 (L) 12/31/2020    Speciality Comments: PLQ Eye Exam: 08/30/2021 WNL @ [facility] Exam  f/u 1 year  Procedures:  No procedures performed Allergies: Ativan [lorazepam], Haldol [haloperidol], Epinephrine, Levofloxacin, Lipitor [atorvastatin], Robaxin [methocarbamol], and Trovan [alatrofloxacin]   Assessment / Plan:     Visit Diagnoses: No diagnosis found.  Orders: No orders of the defined types were placed in this encounter.  No orders of the defined types were placed in this encounter.   Face-to-face time spent with patient was *** minutes. Greater than 50% of time was spent in counseling and coordination  of care.  Follow-Up Instructions: No follow-ups on file.   Earnestine Mealing, CMA  Note - This record has been created using Editor, commissioning.  Chart creation errors have been sought, but may not always  have been located. Such creation errors do not reflect on  the standard of medical care.

## 2022-07-21 ENCOUNTER — Other Ambulatory Visit: Payer: Self-pay | Admitting: Rheumatology

## 2022-07-21 NOTE — Telephone Encounter (Signed)
Next Visit: 07/24/2022  Last Visit: 02/20/2022  Labs: 05/19/2022 GFR 42.73,   Eye exam: 08/30/2021 WNL    Current Dose per office note 02/20/2022: Plaquenil 200 mg 1 tablet by mouth daily   WV:[id number] arthritis involving multiple sites with positive rheumatoid factor   Last Fill: 02/20/2022  Okay to refill Plaquenil?

## 2022-07-22 ENCOUNTER — Other Ambulatory Visit: Payer: Self-pay

## 2022-07-22 ENCOUNTER — Telehealth: Payer: Self-pay | Admitting: Family Medicine

## 2022-07-22 DIAGNOSIS — I10 Essential (primary) hypertension: Secondary | ICD-10-CM

## 2022-07-22 MED ORDER — ATENOLOL 50 MG PO TABS: 50.0000 mg | ORAL_TABLET | Freq: Two times a day (BID) | ORAL | 3 refills | Status: DC

## 2022-07-22 NOTE — Telephone Encounter (Signed)
  Encourage patient to contact the pharmacy for refills or they can request refills through [place]:  Please schedule appointment if longer than 1 year  NEXT APPOINTMENT DATE:  MEDICATION:  atenolol (TENORMIN) 50 MG tablet   Is the patient out of medication? YES  PHARMACY:  [id number] [place], [place] - [address]: [phone] Fax: [phone]     Let patient know to contact pharmacy at the end of the day to make sure medication is ready.  Please notify patient to allow 48-72 hours to process

## 2022-07-24 ENCOUNTER — Ambulatory Visit: Payer: PPO | Admitting: Rheumatology

## 2022-07-24 DIAGNOSIS — Z8669 Personal history of other diseases of the nervous system and sense organs: Secondary | ICD-10-CM

## 2022-07-24 DIAGNOSIS — M5136 Other intervertebral disc degeneration, lumbar region: Secondary | ICD-10-CM

## 2022-07-24 DIAGNOSIS — T8459XS Infection and inflammatory reaction due to other internal joint prosthesis, sequela: Secondary | ICD-10-CM

## 2022-07-24 DIAGNOSIS — M797 Fibromyalgia: Secondary | ICD-10-CM

## 2022-07-24 DIAGNOSIS — D1771 Benign lipomatous neoplasm of kidney: Secondary | ICD-10-CM

## 2022-07-24 DIAGNOSIS — I10 Essential (primary) hypertension: Secondary | ICD-10-CM

## 2022-07-24 DIAGNOSIS — N183 Chronic kidney disease, stage 3 unspecified: Secondary | ICD-10-CM

## 2022-07-24 DIAGNOSIS — M21372 Foot drop, left foot: Secondary | ICD-10-CM

## 2022-07-24 DIAGNOSIS — S72451D Displaced supracondylar fracture without intracondylar extension of lower end of right femur, subsequent encounter for closed fracture with routine healing: Secondary | ICD-10-CM

## 2022-07-24 DIAGNOSIS — Z96641 Presence of right artificial hip joint: Secondary | ICD-10-CM

## 2022-07-24 DIAGNOSIS — Z79899 Other long term (current) drug therapy: Secondary | ICD-10-CM

## 2022-07-24 DIAGNOSIS — Z86010 Personal history of colonic polyps: Secondary | ICD-10-CM

## 2022-07-24 DIAGNOSIS — M0579 Rheumatoid arthritis with rheumatoid factor of multiple sites without organ or systems involvement: Secondary | ICD-10-CM

## 2022-07-24 DIAGNOSIS — Z96652 Presence of left artificial knee joint: Secondary | ICD-10-CM

## 2022-07-24 DIAGNOSIS — M810 Age-related osteoporosis without current pathological fracture: Secondary | ICD-10-CM

## 2022-07-24 DIAGNOSIS — G9389 Other specified disorders of brain: Secondary | ICD-10-CM

## 2022-07-24 DIAGNOSIS — Z8659 Personal history of other mental and behavioral disorders: Secondary | ICD-10-CM

## 2022-07-24 DIAGNOSIS — Z96651 Presence of right artificial knee joint: Secondary | ICD-10-CM

## 2022-07-24 DIAGNOSIS — E785 Hyperlipidemia, unspecified: Secondary | ICD-10-CM

## 2022-07-25 ENCOUNTER — Other Ambulatory Visit: Payer: Self-pay

## 2022-07-25 DIAGNOSIS — I10 Essential (primary) hypertension: Secondary | ICD-10-CM

## 2022-07-25 MED ORDER — ATENOLOL 50 MG PO TABS: 50.0000 mg | ORAL_TABLET | Freq: Two times a day (BID) | ORAL | 3 refills | Status: DC

## 2022-07-25 NOTE — Telephone Encounter (Signed)
Patient states: - She was informed by pharmacy that they had not received rx order - She would like medication sent in today since she will be out over the weekend

## 2022-07-31 NOTE — Progress Notes (Signed)
Office Visit Note  Patient: Laura Chambers             Date of Birth: 03-21-1941           MRN: [id number]             PCP: Leamon Arnt, MD Referring: Leamon Arnt, MD Visit Date: 08/13/2022 Occupation: '@GUAROCC'$ @  Subjective:  Medication management   History of Present Illness: Laura Chambers is a 82 y.o. female with history of seropositive rheumatoid arthritis, osteoarthritis, degenerative disc disease, fibromyalgia and osteoporosis.  She states she had been experiencing discomfort in her joints but she has not noticed any joint swelling.  Her right total hip replacement is doing well.  She continues to have some discomfort in her knee joints.  She has been taking hydroxychloroquine 200 mg p.o. daily without any interruption.  She has an eye appointment coming up in February.  She has intermittent discomfort from fibromyalgia.  She states her blood pressure has been high and she has an appointment coming up with her PCP to discuss treatment of hypertension.    Activities of Daily Living:  Patient reports morning stiffness for 0  none .   Patient Reports nocturnal pain.  Difficulty dressing/grooming: Denies Difficulty climbing stairs: Reports Difficulty getting out of chair: Denies Difficulty using hands for taps, buttons, cutlery, and/or writing: Denies  Review of Systems  Constitutional:  Positive for fatigue.  HENT: Negative.  Negative for mouth sores and mouth dryness.   Eyes:  Positive for dryness.  Respiratory: Negative.  Negative for shortness of breath.   Cardiovascular:  Negative for palpitations.  Gastrointestinal: Negative.  Negative for blood in stool, constipation and diarrhea.  Endocrine: Negative.  Negative for increased urination.  Genitourinary: Negative.  Negative for involuntary urination.  Musculoskeletal:  Positive for joint pain, gait problem, joint pain, joint swelling and muscle tenderness. Negative for myalgias, muscle weakness, morning  stiffness and myalgias.  Skin: Negative.  Negative for color change, rash, hair loss and sensitivity to sunlight.  Allergic/Immunologic: Negative.  Negative for susceptible to infections.  Neurological:  Positive for dizziness and headaches.  Hematological: Negative.  Negative for swollen glands.  Psychiatric/Behavioral: Negative.  Negative for depressed mood and sleep disturbance. The patient is not nervous/anxious.     PMFS History:  Patient Active Problem List   Diagnosis Date Noted   Brain lesion 06/05/2022   Osteoporosis, post-menopausal 06/24/2021   Anemia associated with chronic renal failure 05/16/2020   Chronic constipation 01/12/2020   History of femur fracture supracondylar 2021, right 01/03/2020   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    Rheumatoid arthritis involving multiple sites with positive rheumatoid factor ([place]) 10/13/2018   Hx of colonic polyps 12/15/2017   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   Chronic kidney disease (CKD), active medical management without dialysis, stage 3 (moderate) 12/15/2017   Fibromyalgia 12/15/2017   History of total right hip replacement 12/15/2017   DDD (degenerative disc disease), lumbar 12/15/2017   S/P total knee replacement, right 12/15/2017   Left foot drop 12/15/2017   Reactive depression (situational) 08/17/2017   Status post revision of total knee, left 06/09/2017   Essential hypertension 12/08/2016   Urine retention 12/02/2016   Trigeminal neuralgia of right side of face 08/19/2015   Peripheral neuropathy 01/12/2014   Dyslipidemia 06/16/2012    Past Medical History:  Diagnosis Date   Anemia    Anxiety    Arthritis    Atelectasis, left    left  lower lung   Blood transfusion without reported diagnosis    Cataract cortical, senile, bilateral 73/22/0254   Complication of anesthesia 2012   "irritated trachea" with cough x 1 year from intubation 2012.  smaller tube used with other surgeries    Depression    just lost her son a month ago   Esophageal hernia    Fibromyalgia    Headache    occasionally   HTN (hypertension)    Hx of colonic polyps    Hydronephrosis of right kidney    Hypercholesterolemia    Hypertensive retinopathy of both eyes 03/19/2018   Neuropathy due to medical condition ([place])    bilateral lower legs   Obesity    Osteoarthritis (arthritis due to wear and tear of joints)    Osteoporosis    Renal angiomyolipoma    Rheumatoid arthritis ([place])     Family History  Problem Relation Age of Onset   Diabetes Father    Osteoarthritis Father    Heart disease Father    Stroke Father    Osteoarthritis Mother    Stroke Mother    Pneumonia Mother    Colon cancer Sister 23   Colon polyps Other        neice/nephew   Diabetes Sister    Diabetes Brother    Past Surgical History:  Procedure Laterality Date   ABDOMINAL HYSTERECTOMY  1969   BACK SURGERY  2012   rod in back-  lumbar disc removal with bone graft   blephoroplasty     bilateral   BREAST BIOPSY  1989   CATARACT EXTRACTION Bilateral    05/2020, 06/2020   CATARACT EXTRACTION, BILATERAL     CHOLECYSTECTOMY  2010   COLONOSCOPY  2022   with polypectomy   EXCISIONAL TOTAL KNEE ARTHROPLASTY WITH ANTIBIOTIC SPACERS Left 11/14/2016   Procedure: EXCISIONAL LEFT TOTAL KNEE ARTHROPLASTY WITH  PLACEMENT OF ANTIBIOTIC SPACERS;  Surgeon: [name], MD;  Location: [facility];  Service: Orthopedics;  Laterality: Left;   JOINT REPLACEMENT     NISSEN FUNDOPLICATION     ORIF FEMUR FRACTURE Right 01/04/2020   Procedure: OPEN REDUCTION INTERNAL FIXATION (ORIF) DISTAL FEMUR FRACTURE;  Surgeon: Shona Needles, MD;  Location: [facility];  Service: Orthopedics;  Laterality: Right;   PARAESOPHAGEAL HERNIA REPAIR  1989   REVISION TOTAL KNEE ARTHROPLASTY Left 06/09/2017   TONSILLECTOMY  1946   TOTAL HIP ARTHROPLASTY Right 08/31/2012   Procedure: TOTAL HIP ARTHROPLASTY ANTERIOR APPROACH;  Surgeon: Mauri Pole,  MD;  Location: [facility];  Service: Orthopedics;  Laterality: Right;   TOTAL KNEE ARTHROPLASTY  2001   left   TOTAL KNEE ARTHROPLASTY  2010   right   TOTAL KNEE REVISION Left 06/09/2017   Procedure: LEFT REVISION KNEE ARTHROPLASTY;  Surgeon: [name], MD;  Location: [facility];  Service: Orthopedics;  Laterality: Left;   Social History   Social History Narrative   Patient lives at home alone.   Caffeine Use: 1 cup daily   Immunization History  Administered Date(s) Administered   Fluad Quad(high Dose 65+) 04/26/2019, 05/08/2020, 04/02/2022   Influenza, High Dose Seasonal PF 03/27/2018   Influenza,inj,Quad PF,6+ Mos 03/13/2017   Influenza-Unspecified 03/26/2018, 06/04/2020, 07/18/2021   PFIZER(Purple Top)SARS-COV-2 Vaccination 09/02/2019, 09/30/2019, 05/08/2020   Pneumococcal Conjugate-13 02/26/2018   Pneumococcal Polysaccharide-23 05/17/2019     Objective: Vital Signs: BP (!) 162/75 (BP Location: Left Arm, Patient Position: Sitting, Cuff Size: Large)   Pulse (!) 58   Ht '5\' 7"'$  (1.702  m)   Wt 205 lb 6.4 oz (93.2 kg)   BMI 32.17 kg/m    Physical Exam Vitals and nursing note reviewed.  Constitutional:      Appearance: She is well-developed.  HENT:     Head: Normocephalic and atraumatic.  Eyes:     Conjunctiva/sclera: Conjunctivae normal.  Cardiovascular:     Rate and Rhythm: Normal rate and regular rhythm.     Heart sounds: Normal heart sounds.  Pulmonary:     Effort: Pulmonary effort is normal.     Breath sounds: Normal breath sounds.  Abdominal:     General: Bowel sounds are normal.     Palpations: Abdomen is soft.  Musculoskeletal:     Cervical back: Normal range of motion.  Lymphadenopathy:     Cervical: No cervical adenopathy.  Skin:    General: Skin is warm and dry.     Capillary Refill: Capillary refill takes less than 2 seconds.  Neurological:     Mental Status: She is alert and oriented to person, place, and time.  Psychiatric:        Behavior:  Behavior normal.      Musculoskeletal Exam: Cervical spine was in good range of motion.  Shoulder joints, elbow joints, wrist joints, MCPs PIPs and DIPs been good range of motion.  She had mild PIP and DIP thickening with no synovitis.  Hip joints were difficult to assess in the sitting position.  She ambulates with the help of a walker.  Both knees were replaced and were in good range of motion.  She had left foot drop.  Right ankle joint was in good range of motion.  She had bilateral lower extremity pedal edema.  There was no tenderness over ankles or MTPs.  CDAI Exam: CDAI Score: -- Patient Global: 6 mm; Provider Global: 2 mm Swollen: --; Tender: -- Joint Exam 08/13/2022   No joint exam has been documented for this visit   There is currently no information documented on the homunculus. Go to the Rheumatology activity and complete the homunculus joint exam.  Investigation: No additional findings.  Imaging: No results found.  Recent Labs: Lab Results  Component Value Date   WBC 7.3 02/20/2022   HGB 13.5 02/20/2022   PLT 188 02/20/2022   NA 141 05/19/2022   K 5.0 05/19/2022   CL 103 05/19/2022   CO2 32 05/19/2022   GLUCOSE 87 05/19/2022   BUN 19 05/19/2022   CREATININE 1.19 05/19/2022   BILITOT 0.8 05/19/2022   ALKPHOS 87 05/19/2022   AST 22 05/19/2022   ALT 11 05/19/2022   PROT 7.2 05/19/2022   ALBUMIN 3.9 05/19/2022   CALCIUM 9.4 05/19/2022   GFRAA 45 (L) 12/31/2020    Speciality Comments: PLQ Eye Exam: 08/30/2021 WNL @ [facility] Exam  f/u 1 year  Procedures:  No procedures performed Allergies: Ativan [lorazepam], Haldol [haloperidol], Epinephrine, Levofloxacin, Lipitor [atorvastatin], Losartan, Robaxin [methocarbamol], and Trovan [alatrofloxacin]   Assessment / Plan:     Visit Diagnoses: Rheumatoid arthritis involving multiple sites with positive rheumatoid factor (HCC) - Severe rheumatoid arthritis. She has severe intercarpal radiocarpal joint space narrowing  and subtalar joint narrowing on x-rays updated on 12/21/19: She continues to have some joint discomfort.  She denies any joint swelling.  She has been tolerating hydroxychloroquine 200 mg p.o. daily.  She has some joint stiffness in her replaced joints.  High risk medication use - Plaquenil 200 mg 1 tablet by mouth daily. PLQ Eye Exam: 08/30/2021 -patient reports  repeat eye examination is scheduled in February 2024.  Labs from May 19, 2022 were reviewed.  CMP was normal.  CBC was normal in August 2023.  Will check labs today.  Plan: CBC with Differential/Platelet, COMPLETE METABOLIC PANEL WITH GFR.  Information regarding immunization was placed in the AVS.  Displaced supracondylar fracture without intracondylar extension of lower end of right femur, subsequent encounter for closed fracture with routine healing - Followed by Dr. Doreatha Martin.  History of total right hip replacement - Followed by Dr. Doreatha Martin after right femur fracture in June 2021.  She has intermittent discomfort in her replaced hip.  S/P total knee replacement, right - fell in June 2021 and fractured her right femur. Followed by Dr. Doreatha Martin.  Status post revision of total knee, left -chronic discomfort.  Revision 2018 by Dr. Ninfa Linden.  Infected prosthetic knee joint, sequela  DDD (degenerative disc disease), lumbar-she has intermittent pain.  She ambulates with the help of a walker.  Left foot drop-she does not like wearing AFO brace.  Age-related osteoporosis without current pathological fracture - February 08, 2021 DEXA scan showed a T score of -3.1 in the left radius.  Treatment options were discussed but patient declined treatment for osteoporosis.  Calcium rich diet, vitamin D and resistive exercises were discussed.  Fibromyalgia-she has intermittent flares.  Other medical problems are listed as follows:  Brain mass - December 2022 the patient experienced an episode of diplopia and was evaluated by her ophthalmologist Dr. Herbert Deaner  urgently.  She is closely followed by oncology.  Renal angiomyolipoma  Dyslipidemia  Chronic kidney disease (CKD), active medical management without dialysis, stage 3 (moderate) ([place])  History of depression  History of anxiety  Essential hypertension-blood pressure was once she 2/75.  Repeat blood pressure was elevated.  She has an appointment coming up with the PCP to discuss hypertension.  Hx of colonic polyps  Hx of peripheral neuropathy  Orders: Orders Placed This Encounter  Procedures   CBC with Differential/Platelet   COMPLETE METABOLIC PANEL WITH GFR   No orders of the defined types were placed in this encounter.    Follow-Up Instructions: Return in about 5 months (around 01/11/2023) for Rheumatoid arthritis.   Bo Merino, MD  Note - This record has been created using Editor, commissioning.  Chart creation errors have been sought, but may not always  have been located. Such creation errors do not reflect on  the standard of medical care.

## 2022-08-13 ENCOUNTER — Ambulatory Visit: Payer: PPO | Attending: Rheumatology | Admitting: Rheumatology

## 2022-08-13 ENCOUNTER — Encounter: Payer: Self-pay | Admitting: Rheumatology

## 2022-08-13 VITALS — BP 142/73 | HR 57 | Ht 67.0 in | Wt 205.4 lb

## 2022-08-13 DIAGNOSIS — Z96641 Presence of right artificial hip joint: Secondary | ICD-10-CM | POA: Diagnosis not present

## 2022-08-13 DIAGNOSIS — Z96652 Presence of left artificial knee joint: Secondary | ICD-10-CM

## 2022-08-13 DIAGNOSIS — Z860100 Personal history of colon polyps, unspecified: Secondary | ICD-10-CM

## 2022-08-13 DIAGNOSIS — M51369 Other intervertebral disc degeneration, lumbar region without mention of lumbar back pain or lower extremity pain: Secondary | ICD-10-CM

## 2022-08-13 DIAGNOSIS — M797 Fibromyalgia: Secondary | ICD-10-CM

## 2022-08-13 DIAGNOSIS — M5136 Other intervertebral disc degeneration, lumbar region: Secondary | ICD-10-CM | POA: Diagnosis not present

## 2022-08-13 DIAGNOSIS — S72451D Displaced supracondylar fracture without intracondylar extension of lower end of right femur, subsequent encounter for closed fracture with routine healing: Secondary | ICD-10-CM | POA: Diagnosis not present

## 2022-08-13 DIAGNOSIS — N183 Chronic kidney disease, stage 3 unspecified: Secondary | ICD-10-CM

## 2022-08-13 DIAGNOSIS — M0579 Rheumatoid arthritis with rheumatoid factor of multiple sites without organ or systems involvement: Secondary | ICD-10-CM | POA: Diagnosis not present

## 2022-08-13 DIAGNOSIS — Z96651 Presence of right artificial knee joint: Secondary | ICD-10-CM

## 2022-08-13 DIAGNOSIS — M21372 Foot drop, left foot: Secondary | ICD-10-CM

## 2022-08-13 DIAGNOSIS — M810 Age-related osteoporosis without current pathological fracture: Secondary | ICD-10-CM | POA: Diagnosis not present

## 2022-08-13 DIAGNOSIS — E785 Hyperlipidemia, unspecified: Secondary | ICD-10-CM

## 2022-08-13 DIAGNOSIS — G9389 Other specified disorders of brain: Secondary | ICD-10-CM | POA: Diagnosis not present

## 2022-08-13 DIAGNOSIS — T8459XS Infection and inflammatory reaction due to other internal joint prosthesis, sequela: Secondary | ICD-10-CM

## 2022-08-13 DIAGNOSIS — I10 Essential (primary) hypertension: Secondary | ICD-10-CM

## 2022-08-13 DIAGNOSIS — Z96659 Presence of unspecified artificial knee joint: Secondary | ICD-10-CM

## 2022-08-13 DIAGNOSIS — Z79899 Other long term (current) drug therapy: Secondary | ICD-10-CM | POA: Diagnosis not present

## 2022-08-13 DIAGNOSIS — D1771 Benign lipomatous neoplasm of kidney: Secondary | ICD-10-CM

## 2022-08-13 DIAGNOSIS — Z8659 Personal history of other mental and behavioral disorders: Secondary | ICD-10-CM

## 2022-08-13 DIAGNOSIS — Z8669 Personal history of other diseases of the nervous system and sense organs: Secondary | ICD-10-CM

## 2022-08-13 NOTE — Patient Instructions (Signed)
Vaccines You are taking a medication(s) that can suppress your immune system.  The following immunizations are recommended: Flu annually Covid-19  RSV Td/Tdap (tetanus, diphtheria, pertussis) every 10 years Pneumonia (Prevnar 15 then Pneumovax 23 at least 1 year apart.  Alternatively, can take Prevnar 20 without needing additional dose) Shingrix: 2 doses from 4 weeks to 6 months apart  Please check with your PCP to make sure you are up to date.  

## 2022-08-14 LAB — COMPLETE METABOLIC PANEL WITH GFR
AG Ratio: 1.4 (calc) (ref 1.0–2.5)
ALT: 14 U/L (ref 6–29)
AST: 23 U/L (ref 10–35)
Albumin: 4.1 g/dL (ref 3.6–5.1)
Alkaline phosphatase (APISO): 84 U/L (ref 37–153)
BUN/Creatinine Ratio: 21 (calc) (ref 6–22)
BUN: 21 mg/dL (ref 7–25)
CO2: 30 mmol/L (ref 20–32)
Calcium: 9.9 mg/dL (ref 8.6–10.4)
Chloride: 104 mmol/L (ref 98–110)
Creat: 1 mg/dL — ABNORMAL HIGH (ref 0.60–0.95)
Globulin: 2.9 g/dL (calc) (ref 1.9–3.7)
Glucose, Bld: 80 mg/dL (ref 65–99)
Potassium: 4.7 mmol/L (ref 3.5–5.3)
Sodium: 142 mmol/L (ref 135–146)
Total Bilirubin: 1.1 mg/dL (ref 0.2–1.2)
Total Protein: 7 g/dL (ref 6.1–8.1)
eGFR: 56 mL/min/{1.73_m2} — ABNORMAL LOW (ref >=60)

## 2022-08-14 LAB — CBC WITH DIFFERENTIAL/PLATELET
Absolute Monocytes: 540 cells/uL (ref 200–950)
Basophils Absolute: 58 cells/uL (ref 0–200)
Basophils Relative: 0.8 %
Eosinophils Absolute: 321 cells/uL (ref 15–500)
Eosinophils Relative: 4.4 %
HCT: 43.8 % (ref 35.0–45.0)
Hemoglobin: 14.1 g/dL (ref 11.7–15.5)
Lymphs Abs: 1431 cells/uL (ref 850–3900)
MCH: 28.5 pg (ref 27.0–33.0)
MCHC: 32.2 g/dL (ref 32.0–36.0)
MCV: 88.5 fL (ref 80.0–100.0)
MPV: 12.3 fL (ref 7.5–12.5)
Monocytes Relative: 7.4 %
Neutro Abs: 4949 cells/uL (ref 1500–7800)
Neutrophils Relative %: 67.8 %
Platelets: 191 10*3/uL (ref 140–400)
RBC: 4.95 10*6/uL (ref 3.80–5.10)
RDW: 13 % (ref 11.0–15.0)
Total Lymphocyte: 19.6 %
WBC: 7.3 10*3/uL (ref 3.8–10.8)

## 2022-08-14 NOTE — Progress Notes (Signed)
CBC is normal.  CMP is normal except for mildly elevated creatinine which is a stable.

## 2022-08-21 DIAGNOSIS — Z1231 Encounter for screening mammogram for malignant neoplasm of breast: Secondary | ICD-10-CM | POA: Diagnosis not present

## 2022-08-28 DIAGNOSIS — R928 Other abnormal and inconclusive findings on diagnostic imaging of breast: Secondary | ICD-10-CM | POA: Diagnosis not present

## 2022-08-28 DIAGNOSIS — N6325 Unspecified lump in the left breast, overlapping quadrants: Secondary | ICD-10-CM | POA: Diagnosis not present

## 2022-08-28 DIAGNOSIS — R922 Inconclusive mammogram: Secondary | ICD-10-CM | POA: Diagnosis not present

## 2022-08-28 LAB — HM MAMMOGRAPHY

## 2022-09-04 ENCOUNTER — Other Ambulatory Visit: Payer: Self-pay

## 2022-09-04 DIAGNOSIS — N6012 Diffuse cystic mastopathy of left breast: Secondary | ICD-10-CM | POA: Diagnosis not present

## 2022-09-04 DIAGNOSIS — N6321 Unspecified lump in the left breast, upper outer quadrant: Secondary | ICD-10-CM | POA: Diagnosis not present

## 2022-09-04 DIAGNOSIS — N6325 Unspecified lump in the left breast, overlapping quadrants: Secondary | ICD-10-CM | POA: Diagnosis not present

## 2022-09-05 ENCOUNTER — Encounter: Payer: Self-pay | Admitting: Family Medicine

## 2022-09-12 ENCOUNTER — Encounter: Payer: Self-pay | Admitting: Family Medicine

## 2022-09-17 ENCOUNTER — Ambulatory Visit (INDEPENDENT_AMBULATORY_CARE_PROVIDER_SITE_OTHER): Payer: PPO | Admitting: Family Medicine

## 2022-09-17 ENCOUNTER — Encounter: Payer: Self-pay | Admitting: Family Medicine

## 2022-09-17 VITALS — BP 160/84 | HR 63 | Temp 98.1°F | Ht 67.0 in | Wt 206.6 lb

## 2022-09-17 DIAGNOSIS — D332 Benign neoplasm of brain, unspecified: Secondary | ICD-10-CM | POA: Diagnosis not present

## 2022-09-17 DIAGNOSIS — I10 Essential (primary) hypertension: Secondary | ICD-10-CM

## 2022-09-17 MED ORDER — HYDROCHLOROTHIAZIDE 25 MG PO TABS: 25.0000 mg | ORAL_TABLET | Freq: Every day | ORAL | 3 refills | Status: DC

## 2022-09-17 NOTE — Patient Instructions (Signed)
Please return in 6 weeks for blood pressure recheck and blood test.   Add the HCTZ '25mg'$  daily along with your morning atenolol dose.   If you have any questions or concerns, please don't hesitate to send me a message via MyChart or call the office at [phone]. Thank you for visiting with [place] today! It's our pleasure caring for you.

## 2022-09-17 NOTE — Progress Notes (Signed)
Subjective  CC:  Chief Complaint  Patient presents with   Hypertension    HPI: Laura Chambers is a 82 y.o. female who presents to the office today to address the problems listed above in the chief complaint. Hypertension f/u: Patient remains on atenolol 50 mg twice daily.  Home readings remain elevated. She feels well. No cp or sob. She had been on lasix in the past prescribed for prn use due to edema related to kidney dysfunction. Her most recent renal panel showed improved function. She rarely uses the lasix. She did not tolerate losartan due to dizziness.  Following along with NS for brain tumor: stable by scans. Likely benign.   Lab Results  Component Value Date   CREATININE 1.00 (H) 08/13/2022   BUN 21 08/13/2022   NA 142 08/13/2022   K 4.7 08/13/2022   CL 104 08/13/2022   CO2 30 08/13/2022    Assessment  1. Essential hypertension   2. Benign neoplasm of brain, unspecified brain region [facility])      Plan   Hypertension f/u: BP control is poorly controlled. Add hctz 25 daily to atenolol 50 bid. Continue home monitoring.  Brain mass: no new sxs. Monitoring.   Education regarding management of these chronic disease states was given. Management strategies discussed on successive visits include dietary and exercise recommendations, goals of achieving and maintaining IBW, and lifestyle modifications aiming for adequate sleep and minimizing stressors.   Follow up: 6 weeks for htn f/u and bmp  No orders of the defined types were placed in this encounter.  Meds ordered this encounter  Medications   hydrochlorothiazide (HYDRODIURIL) 25 MG tablet    Sig: Take 1 tablet (25 mg total) by mouth daily.    Dispense:  90 tablet    Refill:  3      BP Readings from Last 3 Encounters:  09/17/22 (!) 160/84  08/13/22 (!) 142/73  06/05/22 (!) 142/70   Wt Readings from Last 3 Encounters:  09/17/22 206 lb 9.6 oz (93.7 kg)  08/13/22 205 lb 6.4 oz (93.2 kg)  06/05/22 206 lb 3.2 oz  (93.5 kg)    Lab Results  Component Value Date   CHOL 119 05/19/2022   CHOL 118 01/15/2021   CHOL 141 05/16/2020   Lab Results  Component Value Date   HDL 47.60 05/19/2022   HDL 49.40 01/15/2021   HDL 45 (L) 05/16/2020   Lab Results  Component Value Date   LDLCALC 48 05/19/2022   LDLCALC 44 01/15/2021   LDLCALC 70 05/16/2020   Lab Results  Component Value Date   TRIG 119.0 05/19/2022   TRIG 123.0 01/15/2021   TRIG 190 (H) 05/16/2020   Lab Results  Component Value Date   CHOLHDL 3 05/19/2022   CHOLHDL 2 01/15/2021   CHOLHDL 3.1 05/16/2020   Lab Results  Component Value Date   LDLDIRECT 58.0 08/17/2017   Lab Results  Component Value Date   CREATININE 1.00 (H) 08/13/2022   BUN 21 08/13/2022   NA 142 08/13/2022   K 4.7 08/13/2022   CL 104 08/13/2022   CO2 30 08/13/2022    The ASCVD Risk score (Arnett DK, et al., 2019) failed to calculate for the following reasons:   The 2019 ASCVD risk score is only valid for ages 65 to 46  I reviewed the patients updated PMH, FH, and SocHx.    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  Priority: High   Rheumatoid arthritis involving multiple sites with positive rheumatoid factor ([place]) 10/13/2018    Priority: High   Chronic kidney disease (CKD), active medical management without dialysis, stage 3 (moderate) 12/15/2017    Priority: High   Fibromyalgia 12/15/2017    Priority: High   Reactive depression (situational) 08/17/2017    Priority: High   Essential hypertension 12/08/2016    Priority: High   Peripheral neuropathy 01/12/2014    Priority: High   Dyslipidemia 06/16/2012    Priority: High   Anemia associated with chronic renal failure 05/16/2020    Priority: Medium    History of femur fracture supracondylar 2021, right 01/03/2020    Priority: Medium    Hx of colonic polyps 12/15/2017    Priority: Medium    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    Priority:  Medium    DDD (degenerative disc disease), lumbar 12/15/2017    Priority: Medium    Chronic constipation 01/12/2020    Priority: Low   History of total right hip replacement 12/15/2017    Priority: Low   S/P total knee replacement, right 12/15/2017    Priority: Low   Left foot drop 12/15/2017    Priority: Low   Status post revision of total knee, left 06/09/2017    Priority: Low   Urine retention 12/02/2016    Priority: Low   Benign neoplasm of brain ([place]) 09/17/2022   Brain lesion 06/05/2022   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    Trigeminal neuralgia of right side of face 08/19/2015    Allergies: Ativan [lorazepam], Haldol [haloperidol], Epinephrine, Levofloxacin, Lipitor [atorvastatin], Losartan, Robaxin [methocarbamol], and Trovan [alatrofloxacin]  Social History: Patient  reports that she has never smoked. She has been exposed to tobacco smoke. She has never used smokeless tobacco. She reports that she does not drink alcohol and does not use drugs.  Current Meds  Medication Sig   atenolol (TENORMIN) 50 MG tablet Take 1 tablet (50 mg total) by mouth 2 (two) times daily.   Calcium Carbonate-Vitamin D (CALCIUM 600+D) 600-400 MG-UNIT tablet Take 1 tablet by mouth daily.    FLUoxetine (PROZAC) 20 MG capsule TAKE 1 CAPSULE BY MOUTH EVERY DAY   folic acid (FOLVITE) Q000111Q MCG tablet Take 800 mcg daily by mouth.    gabapentin (NEURONTIN) 300 MG capsule TAKE 1 CAPSULE BY MOUTH TWICE A DAY   Ginger, Zingiber officinalis, (GINGER ROOT PO) Take by mouth.   hydrochlorothiazide (HYDRODIURIL) 25 MG tablet Take 1 tablet (25 mg total) by mouth daily.   hydroxychloroquine (PLAQUENIL) 200 MG tablet TAKE 1 TABLET BY MOUTH EVERY DAY   Multiple Vitamin (MULTIVITAMIN WITH MINERALS) TABS tablet Take 1 tablet by mouth daily.    Omega-3 1000 MG CAPS Take 1 capsule by mouth daily.    rosuvastatin (CRESTOR) 10 MG tablet TAKE 1 TABLET BY MOUTH EVERY DAY   tamsulosin  (FLOMAX) 0.4 MG CAPS capsule Take 1 capsule (0.4 mg total) by mouth as needed. Give after same meal each day. Do not open, crush or chew tabs.   TART CHERRY PO Take by mouth.   Turmeric (QC TUMERIC COMPLEX PO) Take by mouth.   [DISCONTINUED] furosemide (LASIX) 40 MG tablet Take 1 tablet (40 mg total) by mouth daily as needed. As needed for weight gain >/= 3 pounds. Notify provider if meds given.    Review of Systems: Cardiovascular: negative for chest pain, palpitations, leg swelling, orthopnea Respiratory: negative for SOB, wheezing or persistent cough Gastrointestinal: negative for abdominal  pain Genitourinary: negative for dysuria or gross hematuria  Objective  Vitals: BP (!) 160/84   Pulse 63   Temp 98.1 F (36.7 C)   Ht '5\' 7"'$  (1.702 m)   Wt 206 lb 9.6 oz (93.7 kg)   SpO2 98%   BMI 32.36 kg/m  General: no acute distress  Psych:  Alert and oriented, normal mood and affect HEENT:  Normocephalic, atraumatic, supple neck  Cardiovascular:  RRR without murmur. no edema Respiratory:  Good breath sounds bilaterally, CTAB with normal respiratory effort Skin:  Warm, no rashes Neurologic:   Mental status is normal Commons side effects, risks, benefits, and alternatives for medications and treatment plan prescribed today were discussed, and the patient expressed understanding of the given instructions. Patient is instructed to call or message via MyChart if he/she has any questions or concerns regarding our treatment plan. No barriers to understanding were identified. We discussed Red Flag symptoms and signs in detail. Patient expressed understanding regarding what to do in case of urgent or emergency type symptoms.  Medication list was reconciled, printed and provided to the patient in AVS. Patient instructions and summary information was reviewed with the patient as documented in the AVS. This note was prepared with assistance of Dragon voice recognition software. Occasional wrong-word or  sound-a-like substitutions may have occurred due to the inherent limitation

## 2022-10-20 ENCOUNTER — Ambulatory Visit (INDEPENDENT_AMBULATORY_CARE_PROVIDER_SITE_OTHER): Payer: PPO

## 2022-10-20 VITALS — Wt 204.0 lb

## 2022-10-20 DIAGNOSIS — Z0000 Encounter for general adult medical examination without abnormal findings: Secondary | ICD-10-CM

## 2022-10-20 NOTE — Progress Notes (Signed)
I connected with  Laura Chambers on 10/20/22 by a audio enabled telemedicine application and verified that I am speaking with the correct person using two identifiers.  Patient Location: Home  Provider Location: Office/Clinic  I discussed the limitations of evaluation and management by telemedicine. The patient expressed understanding and agreed to proceed.   Subjective:   Laura Chambers is a 82 y.o. female who presents for Medicare Annual (Subsequent) preventive examination.  Review of Systems     Cardiac Risk Factors include: advanced age (>21men, >49 women);obesity (BMI >30kg/m2);dyslipidemia;hypertension;sedentary lifestyle     Objective:    Today's Vitals   10/20/22 1208  Weight: 204 lb (92.5 kg)   Body mass index is 31.95 kg/m.     10/20/2022   12:13 PM 02/21/2022   11:13 AM 10/07/2021   12:08 PM 08/05/2021    1:32 PM 07/16/2020    1:15 PM 01/30/2020    2:00 PM 01/18/2020    9:47 AM  Advanced Directives  Does Patient Have a Medical Advance Directive? Yes Yes Yes No No Yes Yes  Type of Estate agent of [place];Living will Living will;Healthcare Power of [employer] Power of Attorney      Does patient want to make changes to medical advance directive?  No - Patient declined    No - Patient declined No - Patient declined  Copy of Healthcare Power of Attorney in Chart? No - copy requested  No - copy requested      Would patient like information on creating a medical advance directive?     No - Patient declined      Current Medications (verified) Outpatient Encounter Medications as of 10/20/2022  Medication Sig   atenolol (TENORMIN) 50 MG tablet Take 1 tablet (50 mg total) by mouth 2 (two) times daily.   Calcium Carbonate-Vitamin D (CALCIUM 600+D) 600-400 MG-UNIT tablet Take 1 tablet by mouth daily.    FLUoxetine (PROZAC) 20 MG capsule TAKE 1 CAPSULE BY MOUTH EVERY DAY   folic acid (FOLVITE) 800 MCG tablet Take 800 mcg daily by mouth.     gabapentin (NEURONTIN) 300 MG capsule TAKE 1 CAPSULE BY MOUTH TWICE A DAY   Ginger, Zingiber officinalis, (GINGER ROOT PO) Take by mouth.   hydrochlorothiazide (HYDRODIURIL) 25 MG tablet Take 1 tablet (25 mg total) by mouth daily.   hydroxychloroquine (PLAQUENIL) 200 MG tablet TAKE 1 TABLET BY MOUTH EVERY DAY   Multiple Vitamin (MULTIVITAMIN WITH MINERALS) TABS tablet Take 1 tablet by mouth daily.    Omega-3 1000 MG CAPS Take 1 capsule by mouth daily.    rosuvastatin (CRESTOR) 10 MG tablet TAKE 1 TABLET BY MOUTH EVERY DAY   tamsulosin (FLOMAX) 0.4 MG CAPS capsule Take 1 capsule (0.4 mg total) by mouth as needed. Give after same meal each day. Do not open, crush or chew tabs.   TART CHERRY PO Take by mouth.   Turmeric (QC TUMERIC COMPLEX PO) Take by mouth.   No facility-administered encounter medications on file as of 10/20/2022.    Allergies (verified) Ativan [lorazepam], Haldol [haloperidol], Epinephrine, Levofloxacin, Lipitor [atorvastatin], Losartan, Robaxin [methocarbamol], and Trovan [alatrofloxacin]   History: Past Medical History:  Diagnosis Date   Anemia    Anxiety    Arthritis    Atelectasis, left    left lower lung   Blood transfusion without reported diagnosis    Cataract cortical, senile, bilateral 03/19/2018   Complication of anesthesia 2012   "irritated trachea" with cough x 1 year from intubation  2012.  smaller tube used with other surgeries   Depression    just lost her son a month ago   Esophageal hernia    Fibromyalgia    Headache    occasionally   HTN (hypertension)    Hx of colonic polyps    Hydronephrosis of right kidney    Hypercholesterolemia    Hypertensive retinopathy of both eyes 03/19/2018   Neuropathy due to medical condition    bilateral lower legs   Obesity    Osteoarthritis (arthritis due to wear and tear of joints)    Osteoporosis    Renal angiomyolipoma    Rheumatoid arthritis    Past Surgical History:  Procedure Laterality Date    ABDOMINAL HYSTERECTOMY  1969   BACK SURGERY  2012   rod in back-  lumbar disc removal with bone graft   blephoroplasty     bilateral   BREAST BIOPSY  1989   CATARACT EXTRACTION Bilateral    05/2020, 06/2020   CATARACT EXTRACTION, BILATERAL     CHOLECYSTECTOMY  2010   COLONOSCOPY  2022   with polypectomy   EXCISIONAL TOTAL KNEE ARTHROPLASTY WITH ANTIBIOTIC SPACERS Left 11/14/2016   Procedure: EXCISIONAL LEFT TOTAL KNEE ARTHROPLASTY WITH  PLACEMENT OF ANTIBIOTIC SPACERS;  Surgeon: Kathryne Hitch, MD;  Location: [facility];  Service: Orthopedics;  Laterality: Left;   JOINT REPLACEMENT     NISSEN FUNDOPLICATION     ORIF FEMUR FRACTURE Right 01/04/2020   Procedure: OPEN REDUCTION INTERNAL FIXATION (ORIF) DISTAL FEMUR FRACTURE;  Surgeon: Roby Lofts, MD;  Location: [facility];  Service: Orthopedics;  Laterality: Right;   PARAESOPHAGEAL HERNIA REPAIR  1989   REVISION TOTAL KNEE ARTHROPLASTY Left 06/09/2017   TONSILLECTOMY  1946   TOTAL HIP ARTHROPLASTY Right 08/31/2012   Procedure: TOTAL HIP ARTHROPLASTY ANTERIOR APPROACH;  Surgeon: Shelda Pal, MD;  Location: [facility];  Service: Orthopedics;  Laterality: Right;   TOTAL KNEE ARTHROPLASTY  2001   left   TOTAL KNEE ARTHROPLASTY  2010   right   TOTAL KNEE REVISION Left 06/09/2017   Procedure: LEFT REVISION KNEE ARTHROPLASTY;  Surgeon: Kathryne Hitch, MD;  Location: [facility];  Service: Orthopedics;  Laterality: Left;   Family History  Problem Relation Age of Onset   Diabetes Father    Osteoarthritis Father    Heart disease Father    Stroke Father    Osteoarthritis Mother    Stroke Mother    Pneumonia Mother    Colon cancer Sister 29   Colon polyps Other        neice/nephew   Diabetes Sister    Diabetes Brother    Social History   Socioeconomic History   Marital status: Divorced    Spouse name: Not on file   Number of children: 3   Years of education: College   Highest education level: Not on file  Occupational  History    Employer:     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   Caffeine Use: 1 cup daily   Social Determinants of Health   Financial Resource Strain: Low Risk  (  10/20/2022)   Overall Financial Resource Strain (CARDIA)    Difficulty of Paying Living Expenses: Not hard at all  Food Insecurity: No Food Insecurity (10/20/2022)   Hunger Vital Sign    Worried About Running Out of Food in the Last Year: Never true    Ran Out of Food in the Last Year: Never true  Transportation Needs: No Transportation Needs (10/20/2022)   PRAPARE - Administrator, Civil Service (Medical): No    Lack of Transportation (Non-Medical): No  Physical Activity: Sufficiently Active (10/20/2022)   Exercise Vital Sign    Days of Exercise per Week: 7 days    Minutes of Exercise per Session: 30 min  Stress: No Stress Concern Present (10/20/2022)   [employer] of Occupational Health - Occupational Stress Questionnaire    Feeling of Stress : Not at all  Social Connections: Moderately Isolated (10/20/2022)   Social Connection and Isolation Panel [NHANES]    Frequency of Communication with Friends and Family: More than three times a week    Frequency of Social Gatherings with Friends and Family: Once a week    Attends Religious Services: 1 to 4 times per year    Active Member of [employer] or Organizations: No    Attends Engineer, structural: Never    Marital Status: Divorced    Tobacco Counseling Counseling given: Not Answered   Clinical Intake:  Pre-visit preparation completed: Yes  Pain : No/denies pain     BMI - recorded: 31.95 Nutritional Status: BMI > 30   Obese Nutritional Risks: None Diabetes: No  How often do you need to have someone help you when you read instructions, pamphlets, or other written materials from your doctor or pharmacy?: 1 - Never  Diabetic?no  Interpreter Needed?: No  Information entered by :: Lanier Ensign, LPN   Activities of Daily Living    10/20/2022   12:14 PM  In your present state of health, do you have any difficulty performing the following activities:  Hearing? 0  Vision? 0  Difficulty concentrating or making decisions? 0  Walking or climbing stairs? 1  Dressing or bathing? 0  Doing errands, shopping? 0  Preparing Food and eating ? N  Using the Toilet? N  In the past six months, have you accidently leaked urine? N  Do you have problems with loss of bowel control? N  Managing your Medications? N  Managing your Finances? N  Housekeeping or managing your Housekeeping? N    Patient Care Team: Willow Ora, MD as PCP - General (Family Medicine) Durene Romans, MD as Consulting Physician (Orthopedic Surgery) Dimitri Ped, MD as Consulting Physician (Ophthalmology) Pollyann Savoy, MD as Consulting Physician (Rheumatology) Diona Foley, MD as Consulting Physician (Ophthalmology) Angelene Giovanni, MD as Referring Physician (Neurosurgery) Anson Fret, MD as Consulting Physician (Neurology)  Indicate any recent Medical Services you may have received from other than [facility] providers in the past year (date may be approximate).     Assessment:   This is a routine wellness examination for [place].  Hearing/Vision screen Hearing Screening - Comments:: Pt denies any hearing  Vision Screening - Comments:: Pt follows up ith Dr Benjamine Mola for annual eye exams   Dietary issues and exercise activities discussed: Current Exercise Habits: Home exercise routine, Type of exercise: Other - see comments (Qbee), Time (Minutes): 30, Frequency (Times/Week): 7, Weekly Exercise (Minutes/Week): 210   Goals  Addressed  This Visit's Progress    Patient Stated       Lose weight        Depression Screen    10/20/2022   12:12 PM 09/17/2022    1:30 PM 06/05/2022   10:05 AM 05/19/2022    1:43 PM 02/21/2022   11:11 AM 02/21/2022   11:02 AM 10/07/2021   12:07 PM  PHQ 2/9 Scores  PHQ - 2 Score 0 1 0 0 0 0 0    Fall Risk    10/20/2022   12:14 PM 09/17/2022    1:29 PM 06/05/2022   10:05 AM 05/19/2022    1:43 PM 10/07/2021   12:10 PM  Fall Risk   Falls in the past year? 1 1 0 1 1  Number falls in past yr: 1 1 0 1 1  Injury with Fall? 1 0 0 1 1  Comment     broke femur  Risk for fall due to : History of fall(s);Impaired balance/gait;Impaired mobility;Impaired vision No Fall Risks No Fall Risks History of fall(s) Impaired balance/gait;Impaired mobility;History of fall(s)  Follow up Falls prevention discussed Falls evaluation completed Falls evaluation completed Falls evaluation completed Falls prevention discussed    FALL RISK PREVENTION PERTAINING TO THE HOME:  Any stairs in or around the home? Yes  If so, are there any without handrails? No  Home free of loose throw rugs in walkways, pet beds, electrical cords, etc? Yes  Adequate lighting in your home to reduce risk of falls? Yes   ASSISTIVE DEVICES UTILIZED TO PREVENT FALLS:  Life alert? No  Use of a cane, walker or w/c? Yes  Grab bars in the bathroom? Yes  Shower chair or bench in shower? Yes  Elevated toilet seat or a handicapped toilet? No   TIMED UP AND GO:  Was the test performed? No .   Cognitive Function:        10/20/2022   12:17 PM 10/07/2021   12:14 PM 07/16/2020    1:19 PM 04/19/2019    3:04 PM  6CIT Screen  What Year? 0 points 0 points 0 points 0 points  What month? 0 points 0 points 0 points 0 points  What time? 0 points 0 points  0 points  Count back from 20 0 points 0 points 0 points 0 points  Months in reverse 2 points 0 points 0 points 0 points  Repeat phrase 2 points 0 points 0 points 0  points  Total Score 4 points 0 points  0 points    Immunizations Immunization History  Administered Date(s) Administered   Fluad Quad(high Dose 65+) 04/26/2019, 05/08/2020, 04/02/2022   Influenza, High Dose Seasonal PF 03/27/2018   Influenza,inj,Quad PF,6+ Mos 03/13/2017   Influenza-Unspecified 03/26/2018, 06/04/2020, 07/18/2021   PFIZER(Purple Top)SARS-COV-2 Vaccination 09/02/2019, 09/30/2019, 05/08/2020   Pneumococcal Conjugate-13 02/26/2018   Pneumococcal Polysaccharide-23 05/17/2019      Flu Vaccine status: Up to date  Pneumococcal vaccine status: Up to date  Covid-19 vaccine status: Completed vaccines  Qualifies for Shingles Vaccine? Yes   Zostavax completed No   Shingrix Completed?: No.    Education has been provided regarding the importance of this vaccine. Patient has been advised to call insurance company to determine out of pocket expense if they have not yet received this vaccine. Advised may also receive vaccine at local pharmacy or Health Dept. Verbalized acceptance and understanding.  Screening Tests Health Maintenance  Topic Date Due   COVID-19 Vaccine (4 - 2023-24 season) 03/07/2022   Zoster  Vaccines- Shingrix (1 of 2) 01/19/2023 (Originally 05/19/1960)   INFLUENZA VACCINE  02/05/2023   DEXA SCAN  02/09/2023   Medicare Annual Wellness (AWV)  10/20/2023   COLONOSCOPY (Pts 45-75yrs Insurance coverage will need to be confirmed)  04/03/2026   Pneumonia Vaccine 91+ Years old  Completed   HPV VACCINES  Aged Out   DTaP/Tdap/Td  Discontinued    Health Maintenance  Health Maintenance Due  Topic Date Due   COVID-19 Vaccine (4 - 2023-24 season) 03/07/2022    Colorectal cancer screening: Type of screening: Colonoscopy. Completed 04/03/21. Repeat every 5 years  Mammogram status: Completed 09/10/22. Repeat every year  Bone Density status: Completed 02/08/21. Results reflect: Bone density results: OSTEOPOROSIS. Repeat every 2 years.   Additional  Screening:  Vision Screening: Recommended annual ophthalmology exams for early detection of glaucoma and other disorders of the eye. Is the patient up to date with their annual eye exam?  Yes  Who is the provider or what is the name of the office in which the patient attends annual eye exams? Dr Benjamine Mola If pt is not established with a provider, would they like to be referred to a provider to establish care? No .   Dental Screening: Recommended annual dental exams for proper oral hygiene  Community Resource Referral / Chronic Care Management: CRR required this visit?  No   CCM required this visit?  No      Plan:     I have personally reviewed and noted the following in the patient's chart:   Medical and social history Use of alcohol, tobacco or illicit drugs  Current medications and supplements including opioid prescriptions. Patient is not currently taking opioid prescriptions. Functional ability and status Nutritional status Physical activity Advanced directives List of other physicians Hospitalizations, surgeries, and ER visits in previous 12 months Vitals Screenings to include cognitive, depression, and falls Referrals and appointments  In addition, I have reviewed and discussed with patient certain preventive protocols, quality metrics, and best practice recommendations. A written personalized care plan for preventive services as well as general preventive health recommendations were provided to patient.     Marzella Schlein, LPN   07/25/1476   Nurse Notes: none

## 2022-10-20 NOTE — Patient Instructions (Signed)
Laura Chambers , Thank you for taking time to come for your Medicare Wellness Visit. I appreciate your ongoing commitment to your health goals. Please review the following plan we discussed and let me know if I can assist you in the future.   These are the goals we discussed:  Goals       No Needs (pt-stated)      Care Coordination Interventions: Provided education to patient and/or caregiver about advanced directives Reviewed medications with patient and discussed Purpose of all medications Reviewed scheduled/upcoming provider appointments including pending appointments and verified AWV completed 4/3 2023 with next scheduled AWV on 10/20/2022  Screening for signs and symptoms of depression related to chronic disease state  Assessed social determinant of health barriers        Patient Stated      Want to get better and feel better       Patient Stated      None at this time      Patient Stated      Get to walking without the walker as much and manage balance         This is a list of the screening recommended for you and due dates:  Health Maintenance  Topic Date Due   Zoster (Shingles) Vaccine (1 of 2) Never done   COVID-19 Vaccine (4 - 2023-24 season) 03/07/2022   Flu Shot  02/05/2023   DEXA scan (bone density measurement)  02/09/2023   Medicare Annual Wellness Visit  10/20/2023   Colon Cancer Screening  04/03/2026   Pneumonia Vaccine  Completed   HPV Vaccine  Aged Out   DTaP/Tdap/Td vaccine  Discontinued    Advanced directives: Please bring a copy of your health care power of attorney and living will to the office at your convenience.  Conditions/risks identified: lose weight   Next appointment: Follow up in one year for your annual wellness visit.   Preventive Care 5 Years and Older, Female  Preventive care refers to lifestyle choices and visits with your health care provider that can promote health and wellness. What does preventive care include? A yearly  physical exam. This is also called an annual well check. Dental exams once or twice a year. Routine eye exams. Ask your health care provider how often you should have your eyes checked. Personal lifestyle choices, including: Daily care of your teeth and gums. Regular physical activity. Eating a healthy diet. Avoiding tobacco and drug use. Limiting alcohol use. Practicing safe sex. Taking low doses of aspirin every day. Taking vitamin and mineral supplements as recommended by your health care provider. What happens during an annual well check? The services and screenings done by your health care provider during your annual well check will depend on your age, overall health, lifestyle risk factors, and family history of disease. Counseling  Your health care provider may ask you questions about your: Alcohol use. Tobacco use. Drug use. Emotional well-being. Home and relationship well-being. Sexual activity. Eating habits. History of falls. Memory and ability to understand (cognition). Work and work Astronomer. Screening  You may have the following tests or measurements: Height, weight, and BMI. Blood pressure. Lipid and cholesterol levels. These may be checked every 5 years, or more frequently if you are over 76 years old. Skin check. Lung cancer screening. You may have this screening every year starting at age 83 if you have a 30-pack-year history of smoking and currently smoke or have quit within the past 15 years. Fecal occult blood  test (FOBT) of the stool. You may have this test every year starting at age 34. Flexible sigmoidoscopy or colonoscopy. You may have a sigmoidoscopy every 5 years or a colonoscopy every 10 years starting at age 41. Prostate cancer screening. Recommendations will vary depending on your family history and other risks. Hepatitis C blood test. Hepatitis B blood test. Sexually transmitted disease (STD) testing. Diabetes screening. This is done by  checking your blood sugar (glucose) after you have not eaten for a while (fasting). You may have this done every 1-3 years. Abdominal aortic aneurysm (AAA) screening. You may need this if you are a current or former smoker. Osteoporosis. You may be screened starting at age 63 if you are at high risk. Talk with your health care provider about your test results, treatment options, and if necessary, the need for more tests. Vaccines  Your health care provider may recommend certain vaccines, such as: Influenza vaccine. This is recommended every year. Tetanus, diphtheria, and acellular pertussis (Tdap, Td) vaccine. You may need a Td booster every 10 years. Zoster vaccine. You may need this after age 39. Pneumococcal 13-valent conjugate (PCV13) vaccine. One dose is recommended after age 85. Pneumococcal polysaccharide (PPSV23) vaccine. One dose is recommended after age 50. Talk to your health care provider about which screenings and vaccines you need and how often you need them. This information is not intended to replace advice given to you by your health care provider. Make sure you discuss any questions you have with your health care provider. Document Released: 07/20/2015 Document Revised: 03/12/2016 Document Reviewed: 04/24/2015 Elsevier Interactive Patient Education  2017 [employer].  Fall Prevention in the Home Falls can cause injuries. They can happen to people of all ages. There are many things you can do to make your home safe and to help prevent falls. What can I do on the outside of my home? Regularly fix the edges of walkways and driveways and fix any cracks. Remove anything that might make you trip as you walk through a door, such as a raised step or threshold. Trim any bushes or trees on the path to your home. Use bright outdoor lighting. Clear any walking paths of anything that might make someone trip, such as rocks or tools. Regularly check to see if handrails are loose or  broken. Make sure that both sides of any steps have handrails. Any raised decks and porches should have guardrails on the edges. Have any leaves, snow, or ice cleared regularly. Use sand or salt on walking paths during winter. Clean up any spills in your garage right away. This includes oil or grease spills. What can I do in the bathroom? Use night lights. Install grab bars by the toilet and in the tub and shower. Do not use towel bars as grab bars. Use non-skid mats or decals in the tub or shower. If you need to sit down in the shower, use a plastic, non-slip stool. Keep the floor dry. Clean up any water that spills on the floor as soon as it happens. Remove soap buildup in the tub or shower regularly. Attach bath mats securely with double-sided non-slip rug tape. Do not have throw rugs and other things on the floor that can make you trip. What can I do in the bedroom? Use night lights. Make sure that you have a light by your bed that is easy to reach. Do not use any sheets or blankets that are too big for your bed. They should not hang  down onto the floor. Have a firm chair that has side arms. You can use this for support while you get dressed. Do not have throw rugs and other things on the floor that can make you trip. What can I do in the kitchen? Clean up any spills right away. Avoid walking on wet floors. Keep items that you use a lot in easy-to-reach places. If you need to reach something above you, use a strong step stool that has a grab bar. Keep electrical cords out of the way. Do not use floor polish or wax that makes floors slippery. If you must use wax, use non-skid floor wax. Do not have throw rugs and other things on the floor that can make you trip. What can I do with my stairs? Do not leave any items on the stairs. Make sure that there are handrails on both sides of the stairs and use them. Fix handrails that are broken or loose. Make sure that handrails are as long as  the stairways. Check any carpeting to make sure that it is firmly attached to the stairs. Fix any carpet that is loose or worn. Avoid having throw rugs at the top or bottom of the stairs. If you do have throw rugs, attach them to the floor with carpet tape. Make sure that you have a light switch at the top of the stairs and the bottom of the stairs. If you do not have them, ask someone to add them for you. What else can I do to help prevent falls? Wear shoes that: Do not have high heels. Have rubber bottoms. Are comfortable and fit you well. Are closed at the toe. Do not wear sandals. If you use a stepladder: Make sure that it is fully opened. Do not climb a closed stepladder. Make sure that both sides of the stepladder are locked into place. Ask someone to hold it for you, if possible. Clearly mark and make sure that you can see: Any grab bars or handrails. First and last steps. Where the edge of each step is. Use tools that help you move around (mobility aids) if they are needed. These include: Canes. Walkers. Scooters. Crutches. Turn on the lights when you go into a dark area. Replace any light bulbs as soon as they burn out. Set up your furniture so you have a clear path. Avoid moving your furniture around. If any of your floors are uneven, fix them. If there are any pets around you, be aware of where they are. Review your medicines with your doctor. Some medicines can make you feel dizzy. This can increase your chance of falling. Ask your doctor what other things that you can do to help prevent falls. This information is not intended to replace advice given to you by your health care provider. Make sure you discuss any questions you have with your health care provider. Document Released: 04/19/2009 Document Revised: 11/29/2015 Document Reviewed: 07/28/2014 Elsevier Interactive Patient Education  2017 [employer].

## 2022-10-26 ENCOUNTER — Other Ambulatory Visit: Payer: Self-pay | Admitting: Physician Assistant

## 2022-10-27 ENCOUNTER — Encounter: Payer: Self-pay | Admitting: *Deleted

## 2022-10-27 NOTE — Telephone Encounter (Signed)
Last Fill: 07/21/2022  Eye exam: 08/30/2021 WNL    Labs: 08/13/2022 CBC is normal.  CMP is normal except for mildly elevated creatinine which is a stable.   Next Visit: 02/05/2023  Last Visit: 08/13/2022  [id number] arthritis involving multiple sites with positive rheumatoid factor   Current Dose per office note 08/13/2022: Plaquenil 200 mg 1 tablet by mouth daily   Okay to refill Plaquenil?

## 2022-10-29 DIAGNOSIS — H04123 Dry eye syndrome of bilateral lacrimal glands: Secondary | ICD-10-CM | POA: Diagnosis not present

## 2022-10-29 DIAGNOSIS — Z79899 Other long term (current) drug therapy: Secondary | ICD-10-CM | POA: Diagnosis not present

## 2022-10-29 DIAGNOSIS — H4922 Sixth [abducent] nerve palsy, left eye: Secondary | ICD-10-CM | POA: Diagnosis not present

## 2022-10-29 DIAGNOSIS — H35033 Hypertensive retinopathy, bilateral: Secondary | ICD-10-CM | POA: Diagnosis not present

## 2022-10-29 DIAGNOSIS — H524 Presbyopia: Secondary | ICD-10-CM | POA: Diagnosis not present

## 2022-10-30 ENCOUNTER — Ambulatory Visit: Payer: PPO | Admitting: Family Medicine

## 2022-11-03 ENCOUNTER — Telehealth: Payer: Self-pay | Admitting: Family Medicine

## 2022-11-03 NOTE — Telephone Encounter (Signed)
Patient is requesting to have her 5/2 OV for BP recheck to be virtual. States she is asking since she has to have someone take her but that is proving difficult this week. States her BP has been checked regularly and is well controlled. Please Advise.

## 2022-11-05 ENCOUNTER — Telehealth (INDEPENDENT_AMBULATORY_CARE_PROVIDER_SITE_OTHER): Payer: PPO | Admitting: Family Medicine

## 2022-11-05 VITALS — BP 137/72 | HR 62 | Ht 67.0 in | Wt 196.0 lb

## 2022-11-05 DIAGNOSIS — N183 Chronic kidney disease, stage 3 unspecified: Secondary | ICD-10-CM | POA: Diagnosis not present

## 2022-11-05 DIAGNOSIS — I10 Essential (primary) hypertension: Secondary | ICD-10-CM | POA: Diagnosis not present

## 2022-11-05 DIAGNOSIS — R600 Localized edema: Secondary | ICD-10-CM | POA: Diagnosis not present

## 2022-11-05 MED ORDER — HYDROCHLOROTHIAZIDE 25 MG PO TABS: 12.5000 mg | ORAL_TABLET | Freq: Every day | ORAL | 3 refills | Status: DC

## 2022-11-05 NOTE — Patient Instructions (Signed)
Please return in 6 weeks to recheck blood pressure and bmp.   If you have any questions or concerns, please don't hesitate to send me a message via MyChart or call the office at [phone]. Thank you for visiting with [place] today! It's our pleasure caring for you.

## 2022-11-05 NOTE — Progress Notes (Signed)
TELEPHONE ENCOUNTER   Patient verbally agreed to telephone visit and is aware that copayment and coinsurance may apply. Patient was treated using telemedicine according to accepted telemedicine protocols.  Location of the patient: home Location of provider:  [facility], [facility] Names of all persons participating in the telemedicine service and role in the encounter: Willow Ora, MD Trudie Reed, CMA   Subjective  CC:  Chief Complaint  Patient presents with   Hypertension    HPI: Laura Chambers is a 82 y.o. female who was telephoned today to address the problems listed above in the chief complaint.  Htn f/u: pt can't work her video so we did a telephone call.  She has transportation issues because her children have to bring her to the office visit.  They were unavailable today.  At any rate, she is doing well.  She did not tolerate full dose hydrochlorothiazide 25 mg daily along with her atenolol 50 mg twice daily because her blood pressures dropped to low, 90s over 40s and she felt symptomatic.  Since, she is taking it intermittently and blood pressures are running 130s over 80s.  She is also using Lasix 40 mg intermittently for leg swelling.  No chest pain or shortness of breath.  ASSESSMENT: 1. Essential hypertension   2. Chronic kidney disease (CKD), active medical management without dialysis, stage 3 (moderate)   3. Bilateral lower extremity edema     Hypertension: Recommend atenolol 50 twice daily and hydrochlorothiazide 12.5 mg daily.  Avoid concomitant Lasix use unless needed for extremity swelling.  She will check blood pressure multiple times during the day.  She will follow-up with me in 6 weeks to recheck blood pressure on this regimen.  Also will come to the office for blood pressure check and BMP/kidney function.  Discussed needing to change medication if she needs her Lasix in addition to her HCTZ.  Patient agrees.  Time spent with the patient  (non face-to-face time during this virtual encounter): 13 minutes, spent in obtaining information about her symptoms, reviewing her previous labs, evaluations, and treatments, counseling her about her condition (please see the discussed topics above), and developing a plan to further investigate it; the patient was provided an opportunity to ask questions and all were answered. The patient agreed with the plan and demonstrated an understanding of the instructions.   The patient was advised to call back or seek an in-person evaluation if the symptoms worsen or if the condition fails to improve as anticipated.  Follow up:   Visit date not found  No orders of the defined types were placed in this encounter.  Meds ordered this encounter  Medications   hydrochlorothiazide (HYDRODIURIL) 25 MG tablet    Sig: Take 0.5 tablets (12.5 mg total) by mouth daily.    Dispense:  90 tablet    Refill:  3     I reviewed the patients updated PMH, FH, and SocHx.    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    Priority: High   Rheumatoid arthritis involving multiple sites with positive rheumatoid factor (HCC) 10/13/2018    Priority: High   Chronic kidney disease (CKD), active medical management without dialysis, stage 3 (moderate) 12/15/2017    Priority: High   Fibromyalgia 12/15/2017    Priority: High   Reactive depression (situational) 08/17/2017    Priority: High   Essential hypertension 12/08/2016    Priority: High   Peripheral neuropathy  01/12/2014    Priority: High   Dyslipidemia 06/16/2012    Priority: High   Anemia associated with chronic renal failure 05/16/2020    Priority: Medium    History of femur fracture supracondylar 2021, right 01/03/2020    Priority: Medium    Hx of colonic polyps 12/15/2017    Priority: Medium    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    Priority: Medium    DDD (degenerative disc disease), lumbar 12/15/2017     Priority: Medium    Chronic constipation 01/12/2020    Priority: Low   History of total right hip replacement 12/15/2017    Priority: Low   S/P total knee replacement, right 12/15/2017    Priority: Low   Left foot drop 12/15/2017    Priority: Low   Status post revision of total knee, left 06/09/2017    Priority: Low   Urine retention 12/02/2016    Priority: Low   Bilateral lower extremity edema 11/05/2022   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   Calcium Carbonate-Vitamin D (CALCIUM 600+D) 600-400 MG-UNIT tablet Take 1 tablet by mouth daily.    FLUoxetine (PROZAC) 20 MG capsule TAKE 1 CAPSULE BY MOUTH EVERY DAY   folic acid (FOLVITE) 800 MCG tablet Take 800 mcg daily by mouth.    furosemide (LASIX) 20 MG tablet Take 40 mg by mouth daily as needed.   gabapentin (NEURONTIN) 300 MG capsule TAKE 1 CAPSULE BY MOUTH TWICE A DAY   Ginger, Zingiber officinalis, (GINGER ROOT PO) Take by mouth.   hydroxychloroquine (PLAQUENIL) 200 MG tablet TAKE 1 TABLET BY MOUTH EVERY DAY   Multiple Vitamin (MULTIVITAMIN WITH MINERALS) TABS tablet Take 1 tablet by mouth daily.    Omega-3 1000 MG CAPS Take 1 capsule by mouth daily.    rosuvastatin (CRESTOR) 10 MG tablet TAKE 1 TABLET BY MOUTH EVERY DAY   tamsulosin (FLOMAX) 0.4 MG CAPS capsule Take 1 capsule (0.4 mg total) by mouth as needed. Give after same meal each day. Do not open, crush or chew tabs.   TART CHERRY PO Take by mouth.   Turmeric (QC TUMERIC COMPLEX PO) Take by mouth.   [DISCONTINUED] hydrochlorothiazide (HYDRODIURIL) 25 MG tablet Take 1 tablet (25 mg total) by mouth daily.    Allergies: Patient is allergic to ativan [lorazepam], haldol [haloperidol],  epinephrine, levofloxacin, lipitor [atorvastatin], losartan, robaxin [methocarbamol], and trovan [alatrofloxacin]. Family History: Patient family history includes Colon cancer (age of onset: 94) in her sister; Colon polyps in an other family member; Diabetes in her brother, father, and sister; Heart disease in her father; Osteoarthritis in her father and mother; Pneumonia in her mother; Stroke in her father and mother. Social History:  Patient  reports that she has never smoked. She has been exposed to tobacco smoke. She has never used smokeless tobacco. She reports that she does not drink alcohol and does not use drugs.  Review of Systems: Constitutional: Negative for fever malaise or anorexia Cardiovascular: negative for chest pain Respiratory: negative for SOB or persistent cough Gastrointestinal: negative for abdominal pain     [id number] physician/qualified health professional telephone evaluation 5 to 10 minutes [id number] physician/qualified help functional [place] evaluation for 11 to 20 minutes [id number] physician/qualify  he will professional telephone evaluation for 21 to 30 minutes

## 2022-11-06 ENCOUNTER — Telehealth: Payer: PPO | Admitting: Family Medicine

## 2022-12-09 ENCOUNTER — Telehealth: Payer: Self-pay | Admitting: Family Medicine

## 2022-12-09 NOTE — Telephone Encounter (Signed)
Patient states she was unable to get atenolol 50 mg filled by pharmacy. States she has 12 pills currently. Pt informed me that she has been taking 1 tablet BID  as instructed by PCP but found the bottle instructions say once a day. Pt also states she doesn't think they gave her 90 pills as prescribed.  Please Advise.

## 2022-12-10 ENCOUNTER — Other Ambulatory Visit: Payer: Self-pay

## 2022-12-10 DIAGNOSIS — I10 Essential (primary) hypertension: Secondary | ICD-10-CM

## 2022-12-10 MED ORDER — ATENOLOL 50 MG PO TABS: 50.0000 mg | ORAL_TABLET | Freq: Two times a day (BID) | ORAL | 3 refills | Status: DC

## 2022-12-10 NOTE — Telephone Encounter (Signed)
Just an FYI, pharmacy states because the earlier RX was written incorrectly, pt has to wait until the insurance approves the new one.

## 2022-12-10 NOTE — Telephone Encounter (Signed)
Rx sent 

## 2022-12-11 ENCOUNTER — Ambulatory Visit: Payer: PPO | Admitting: Family Medicine

## 2022-12-19 ENCOUNTER — Ambulatory Visit (INDEPENDENT_AMBULATORY_CARE_PROVIDER_SITE_OTHER): Payer: PPO | Admitting: Family Medicine

## 2022-12-19 ENCOUNTER — Encounter: Payer: Self-pay | Admitting: Family Medicine

## 2022-12-19 VITALS — BP 138/64 | HR 61 | Temp 98.0°F | Ht 67.0 in | Wt 208.8 lb

## 2022-12-19 DIAGNOSIS — G6289 Other specified polyneuropathies: Secondary | ICD-10-CM

## 2022-12-19 DIAGNOSIS — M5136 Other intervertebral disc degeneration, lumbar region: Secondary | ICD-10-CM

## 2022-12-19 DIAGNOSIS — I10 Essential (primary) hypertension: Secondary | ICD-10-CM | POA: Diagnosis not present

## 2022-12-19 DIAGNOSIS — D332 Benign neoplasm of brain, unspecified: Secondary | ICD-10-CM | POA: Diagnosis not present

## 2022-12-19 DIAGNOSIS — N183 Chronic kidney disease, stage 3 unspecified: Secondary | ICD-10-CM

## 2022-12-19 DIAGNOSIS — M0579 Rheumatoid arthritis with rheumatoid factor of multiple sites without organ or systems involvement: Secondary | ICD-10-CM

## 2022-12-19 DIAGNOSIS — M810 Age-related osteoporosis without current pathological fracture: Secondary | ICD-10-CM | POA: Diagnosis not present

## 2022-12-19 LAB — BASIC METABOLIC PANEL
BUN: 29 mg/dL — ABNORMAL HIGH (ref 6–23)
CO2: 33 mEq/L — ABNORMAL HIGH (ref 19–32)
Calcium: 9.2 mg/dL (ref 8.4–10.5)
Chloride: 102 mEq/L (ref 96–112)
Creatinine, Ser: 1.37 mg/dL — ABNORMAL HIGH (ref 0.40–1.20)
GFR: 35.94 mL/min — ABNORMAL LOW (ref >60.00)
Glucose, Bld: 89 mg/dL (ref 70–99)
Potassium: 4.1 mEq/L (ref 3.5–5.1)
Sodium: 142 mEq/L (ref 135–145)

## 2022-12-19 NOTE — Progress Notes (Signed)
Subjective  CC:  Chief Complaint  Patient presents with   Hypertension    HPI: Laura Chambers is a 82 y.o. female who presents to the office today to address the problems listed above in the chief complaint. Follow-up hypertension: Patient on atenolol 50 twice a day and now hydrochlorothiazide 12.5 mg twice a day.  This regimen is working well for her.  Blood pressures ranging 130s over 60s to 70s.  She reports that when she took the whole 25 mg in the morning, blood pressures were dropping into the 90s over 50s and she felt lightheaded.  No palpitations or chest pain.  Has mild lower extremity edema resolved with elevation.  Not using Lasix. Osteoporosis rest at last bone density 2022.  Due for follow-up. 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  Requesting handicap placard due to difficulty walking and high fall risk. Has follow-up with neurosurgery in December.  Fortunately, brain lesion is determined to be benign by serial surveillance.  Occasionally will have diplopia when looking down.  Some headaches.  Overall feeling much better.  No new symptoms  Assessment  1. Essential hypertension   2. Osteoporosis, post-menopausal   3. Rheumatoid arthritis involving multiple sites with positive rheumatoid factor (HCC)   4. DDD (degenerative disc disease), lumbar   5. Other polyneuropathy   6. Chronic kidney disease (CKD), active medical management without dialysis, stage 3 (moderate)   7. Benign neoplasm of brain, unspecified brain region [facility])      Plan  Hypertension: Good control.  Will not push control lower given age and symptoms with low blood pressure and sensitivity medications.  Continue atenolol and HCTZ.  Check renal function and electrolytes today. Monitor leg edema.  Behavioral management and low-salt diet Difficulty with walking due to arthritis DJD polyneuropathy: Permanent handicap placard form completed Monitor chronic  kidney disease on hydrochlorothiazide. Benign brain lesion: Stable Discussed bone density screening at follow-up osteoporosis of the wrist.  Patient to schedule.  Follow up: 6 months for complete physical, November Visit date not found  Orders Placed This Encounter  Procedures   DG Bone Density   Basic metabolic panel   No orders of the defined types were placed in this encounter.     I reviewed the patients updated PMH, FH, and SocHx.    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 dialysis, stage 3 (moderate) 12/15/2017    Priority: High   Fibromyalgia 12/15/2017    Priority: High   Reactive depression (situational) 08/17/2017    Priority: High   Essential hypertension 12/08/2016    Priority: High   Peripheral neuropathy 01/12/2014    Priority: High   Dyslipidemia 06/16/2012    Priority: High   Anemia associated with chronic renal failure 05/16/2020    Priority: Medium    History of femur fracture supracondylar 2021, right 01/03/2020    Priority: Medium    Hx of colonic polyps 12/15/2017    Priority: Medium    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    Priority: Medium    DDD (degenerative disc disease), lumbar 12/15/2017    Priority: Medium    Chronic constipation 01/12/2020    Priority:  Low   History of total right hip replacement 12/15/2017    Priority: Low   S/P total knee replacement, right 12/15/2017    Priority: Low   Left foot drop 12/15/2017    Priority: Low   Status post revision of total knee, left 06/09/2017    Priority: Low   Urine retention 12/02/2016    Priority: Low   Bilateral lower extremity edema 11/05/2022   Periprosthetic fracture around  internal prosthetic right knee joint    Trigeminal neuralgia of right side of face 08/19/2015   Current Meds  Medication Sig   atenolol (TENORMIN) 50 MG tablet Take 1 tablet (50 mg total) by mouth 2 (two) times daily.   Calcium Carbonate-Vitamin D (CALCIUM 600+D) 600-400 MG-UNIT tablet Take 1 tablet by mouth daily.    FLUoxetine (PROZAC) 20 MG capsule TAKE 1 CAPSULE BY MOUTH EVERY DAY   folic acid (FOLVITE) 800 MCG tablet Take 800 mcg daily by mouth.    gabapentin (NEURONTIN) 300 MG capsule TAKE 1 CAPSULE BY MOUTH TWICE A DAY   Ginger, Zingiber officinalis, (GINGER ROOT PO) Take by mouth.   hydrochlorothiazide (HYDRODIURIL) 25 MG tablet Take 0.5 tablets (12.5 mg total) by mouth daily.   hydroxychloroquine (PLAQUENIL) 200 MG tablet TAKE 1 TABLET BY MOUTH EVERY DAY   Multiple Vitamin (MULTIVITAMIN WITH MINERALS) TABS tablet Take 1 tablet by mouth daily.    Omega-3 1000 MG CAPS Take 1 capsule by mouth daily.    rosuvastatin (CRESTOR) 10 MG tablet TAKE 1 TABLET BY MOUTH EVERY DAY   TART CHERRY PO Take by mouth.   Turmeric (QC TUMERIC COMPLEX PO) Take by mouth.    Allergies: Patient is allergic to ativan [lorazepam], haldol [haloperidol], epinephrine, levofloxacin, lipitor [atorvastatin], losartan, robaxin [methocarbamol], and trovan [alatrofloxacin]. Family History: Patient family history includes Colon cancer (age of onset: 67) in her sister; Colon polyps in an other family member; Diabetes in her brother, father, and sister; Heart disease in her father; Osteoarthritis in her father and mother; Pneumonia in her mother; Stroke in her father and mother. Social History:  Patient  reports that she has never smoked. She has been exposed to tobacco smoke. She has never used smokeless tobacco. She reports that she does not drink alcohol and does not use drugs.  Review of Systems: Constitutional: Negative for fever malaise or anorexia Cardiovascular: negative for chest pain Respiratory: negative  for SOB or persistent cough Gastrointestinal: negative for abdominal pain  Objective  Vitals: BP 138/64   Pulse 61   Temp 98 F (36.7 C)   Ht 5\' 7"  (1.702 m)   Wt 208 lb 12.8 oz (94.7 kg)   SpO2 95%   BMI 32.70 kg/m  General: no acute distress , A&Ox3, looks great HEENT: PEERL, conjunctiva normal, neck is supple Cardiovascular:  RRR without murmur or gallop.  Trace edema bilateral lower extremities Respiratory:  Good breath sounds bilaterally, CTAB with normal respiratory effort Skin:  Warm, no rashes  Commons side effects, risks, benefits, and alternatives for medications and treatment plan prescribed today were discussed, and the patient expressed understanding of the given instructions. Patient is instructed to call or message via MyChart if he/she has any questions or concerns regarding our treatment plan. No barriers to understanding were identified. We discussed Red Flag symptoms and signs in detail. Patient expressed understanding regarding what to do in case of urgent or emergency type symptoms.  Medication list was reconciled, printed and provided to the patient in AVS. Patient instructions and summary  information was reviewed with the patient as documented in the AVS. This note was prepared with assistance of Dragon voice recognition software. Occasional wrong-word or sound-a-like substitutions may have occurred due to the inherent limitations of voice recognition software

## 2022-12-19 NOTE — Patient Instructions (Signed)
Please return in November for your annual complete physical; please come fasting.   Please call the office checked below to schedule your appointment for your mammogram and/or bone density screen (the checked studies were ordered): []   Mammogram  [x]   Bone Density  []   [facility]     [address] [place], [place]        [phone]         [x]   [facility]  [address] [place], [place]  [phone]   If you have any questions or concerns, please don't hesitate to send me a message via MyChart or call the office at [phone]. Thank you for visiting with [place] today! It's our pleasure caring for you.

## 2022-12-22 NOTE — Progress Notes (Signed)
See my chart note.

## 2023-01-22 NOTE — Progress Notes (Deleted)
Office Visit Note  Patient: Laura Chambers             Date of Birth: 28-Mar-1941           MRN: [id number]             PCP: Willow Ora, MD Referring: Willow Ora, MD Visit Date: 02/05/2023 Occupation: @GUAROCC @  Subjective:  No chief complaint on file.   History of Present Illness: Laura Chambers is a 82 y.o. female ***     Activities of Daily Living:  Patient reports morning stiffness for *** {minute/hour:19697}.   Patient {ACTIONS;DENIES/REPORTS:21021675::"Denies"} nocturnal pain.  Difficulty dressing/grooming: {ACTIONS;DENIES/REPORTS:21021675::"Denies"} Difficulty climbing stairs: {ACTIONS;DENIES/REPORTS:21021675::"Denies"} Difficulty getting out of chair: {ACTIONS;DENIES/REPORTS:21021675::"Denies"} Difficulty using hands for taps, buttons, cutlery, and/or writing: {ACTIONS;DENIES/REPORTS:21021675::"Denies"}  No Rheumatology ROS completed.   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   Chronic kidney disease (CKD), active medical management without dialysis, stage 3 (moderate) 12/15/2017   Fibromyalgia 12/15/2017   History of total right hip replacement 12/15/2017   DDD (degenerative disc disease), lumbar 12/15/2017   S/P total knee replacement, right 12/15/2017   Left foot drop 12/15/2017   Reactive depression (situational) 08/17/2017   Status post revision of total knee, left 06/09/2017   Essential  hypertension 12/08/2016   Urine retention 12/02/2016   Trigeminal neuralgia of right side of face 08/19/2015   Peripheral neuropathy 01/12/2014   Dyslipidemia 06/16/2012    Past Medical History:  Diagnosis Date   Anemia    Anxiety    Arthritis    Atelectasis, left    left lower lung   Blood transfusion without reported diagnosis    Cataract cortical, senile, bilateral 03/19/2018   Complication of anesthesia 2012   "irritated trachea" with cough x 1 year from intubation 2012.  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)    Osteoporosis    Renal angiomyolipoma    Rheumatoid arthritis (HCC)     Family History  Problem Relation Age of Onset   Diabetes Father    Osteoarthritis Father    Heart disease Father    Stroke Father    Osteoarthritis Mother    Stroke Mother    Pneumonia Mother    Colon cancer Sister 1   Colon polyps Other        neice/nephew   Diabetes Sister    Diabetes Brother    Past Surgical History:  Procedure Laterality Date   ABDOMINAL HYSTERECTOMY  1969   BACK SURGERY  2012   rod in back-  lumbar disc removal with bone graft   blephoroplasty     bilateral   BREAST BIOPSY  1989   CATARACT EXTRACTION Bilateral    05/2020, 06/2020   CATARACT EXTRACTION, BILATERAL     CHOLECYSTECTOMY  2010   COLONOSCOPY  2022   with polypectomy   EXCISIONAL TOTAL KNEE ARTHROPLASTY WITH ANTIBIOTIC SPACERS Left 11/14/2016   Procedure: EXCISIONAL LEFT TOTAL KNEE ARTHROPLASTY WITH  PLACEMENT OF ANTIBIOTIC SPACERS;  Surgeon: Kathryne Hitch, MD;  Location: [facility];   Service: Orthopedics;  Laterality: Left;   JOINT REPLACEMENT     NISSEN FUNDOPLICATION     ORIF FEMUR FRACTURE Right 01/04/2020   Procedure: OPEN REDUCTION INTERNAL FIXATION (ORIF) DISTAL FEMUR FRACTURE;  Surgeon: Roby Lofts, MD;  Location: [facility];  Service: Orthopedics;  Laterality: Right;   PARAESOPHAGEAL HERNIA REPAIR  1989   REVISION TOTAL KNEE ARTHROPLASTY Left 06/09/2017   TONSILLECTOMY  1946   TOTAL HIP ARTHROPLASTY Right 08/31/2012   Procedure: TOTAL HIP ARTHROPLASTY ANTERIOR APPROACH;  Surgeon: Shelda Pal, MD;  Location: [facility];  Service: Orthopedics;  Laterality: Right;   TOTAL KNEE ARTHROPLASTY  2001   left   TOTAL KNEE ARTHROPLASTY  2010   right   TOTAL KNEE REVISION Left 06/09/2017   Procedure: LEFT REVISION KNEE ARTHROPLASTY;  Surgeon: Kathryne Hitch, MD;  Location: [facility];  Service: Orthopedics;  Laterality: Left;   Social History   Social History Narrative   Patient lives at home alone.   Caffeine Use: 1 cup daily   Immunization History  Administered Date(s) Administered   Fluad Quad(high Dose 65+) 04/26/2019, 05/08/2020, 04/02/2022   Influenza, High Dose Seasonal PF 03/27/2018   Influenza,inj,Quad PF,6+ Mos 03/13/2017   Influenza-Unspecified 03/26/2018, 06/04/2020, 07/18/2021   PFIZER(Purple Top)SARS-COV-2 Vaccination 09/02/2019, 09/30/2019, 05/08/2020   Pneumococcal Conjugate-13 02/26/2018   Pneumococcal Polysaccharide-23 05/17/2019     Objective: Vital Signs: There were no vitals taken for this visit.   Physical Exam   Musculoskeletal Exam: ***  CDAI Exam: CDAI Score: -- Patient Global: --; Provider Global: -- Swollen: --; Tender: -- Joint Exam 02/05/2023   No joint exam has been documented for this visit   There is currently no information documented on the homunculus. Go to the Rheumatology activity and complete the homunculus joint exam.  Investigation: No additional findings.  Imaging: No results found.  Recent Labs: Lab  Results  Component Value Date   WBC 7.3 08/13/2022   HGB 14.1 08/13/2022   PLT 191 08/13/2022   NA 142 12/19/2022   K 4.1 12/19/2022   CL 102 12/19/2022   CO2 33 (H) 12/19/2022   GLUCOSE 89 12/19/2022   BUN 29 (H) 12/19/2022   CREATININE 1.37 (H) 12/19/2022   BILITOT 1.1 08/13/2022   ALKPHOS 87 05/19/2022   AST 23 08/13/2022   ALT 14 08/13/2022   PROT 7.0 08/13/2022   ALBUMIN 3.9 05/19/2022   CALCIUM 9.2 12/19/2022   GFRAA 45 (L) 12/31/2020    Speciality Comments: PLQ Eye Exam 10/29/2022 WNL @ [facility]  f/u 1 year  Procedures:  No procedures performed Allergies: Ativan [lorazepam], Haldol [haloperidol], Epinephrine, Levofloxacin, Lipitor [atorvastatin], Losartan, Robaxin [methocarbamol], and Trovan [alatrofloxacin]   Assessment / Plan:     Visit Diagnoses: No diagnosis found.  Orders: No orders of the defined types were placed in this encounter.  No orders of the defined types were placed in this encounter.   Face-to-face time spent with patient  was *** minutes. Greater than 50% of time was spent in counseling and coordination of care.  Follow-Up Instructions: No follow-ups on file.   Ellen Henri, CMA  Note - This record has been created using Animal nutritionist.  Chart creation errors have been sought, but may not always  have been located. Such creation errors do not reflect on  the standard of medical care.

## 2023-01-25 ENCOUNTER — Other Ambulatory Visit: Payer: Self-pay | Admitting: Physician Assistant

## 2023-01-26 NOTE — Telephone Encounter (Signed)
Last Fill: 10/27/2022  Eye exam: 10/29/2022 WNL    Labs: 08/13/2022 CBC is normal.  CMP is normal except for mildly elevated creatinine which is a stable.   Next Visit: 02/05/2023  Last Visit: 08/13/2022  RU:[id number] arthritis involving multiple sites with positive rheumatoid factor   Current Dose per office note 08/13/2022: Plaquenil 200 mg 1 tablet by mouth daily   Patient to update labs at upcoming appointment on 02/05/2023  Okay to refill Plaquenil?

## 2023-02-05 ENCOUNTER — Ambulatory Visit: Payer: PPO | Admitting: Rheumatology

## 2023-02-05 DIAGNOSIS — Z79899 Other long term (current) drug therapy: Secondary | ICD-10-CM

## 2023-02-05 DIAGNOSIS — G9389 Other specified disorders of brain: Secondary | ICD-10-CM

## 2023-02-05 DIAGNOSIS — Z96652 Presence of left artificial knee joint: Secondary | ICD-10-CM

## 2023-02-05 DIAGNOSIS — I10 Essential (primary) hypertension: Secondary | ICD-10-CM

## 2023-02-05 DIAGNOSIS — Z8669 Personal history of other diseases of the nervous system and sense organs: Secondary | ICD-10-CM

## 2023-02-05 DIAGNOSIS — E785 Hyperlipidemia, unspecified: Secondary | ICD-10-CM

## 2023-02-05 DIAGNOSIS — M797 Fibromyalgia: Secondary | ICD-10-CM

## 2023-02-05 DIAGNOSIS — D1771 Benign lipomatous neoplasm of kidney: Secondary | ICD-10-CM

## 2023-02-05 DIAGNOSIS — M0579 Rheumatoid arthritis with rheumatoid factor of multiple sites without organ or systems involvement: Secondary | ICD-10-CM

## 2023-02-05 DIAGNOSIS — M21372 Foot drop, left foot: Secondary | ICD-10-CM

## 2023-02-05 DIAGNOSIS — Z86010 Personal history of colonic polyps: Secondary | ICD-10-CM

## 2023-02-05 DIAGNOSIS — T8459XS Infection and inflammatory reaction due to other internal joint prosthesis, sequela: Secondary | ICD-10-CM

## 2023-02-05 DIAGNOSIS — M810 Age-related osteoporosis without current pathological fracture: Secondary | ICD-10-CM

## 2023-02-05 DIAGNOSIS — Z96641 Presence of right artificial hip joint: Secondary | ICD-10-CM

## 2023-02-05 DIAGNOSIS — N183 Chronic kidney disease, stage 3 unspecified: Secondary | ICD-10-CM

## 2023-02-05 DIAGNOSIS — S72451D Displaced supracondylar fracture without intracondylar extension of lower end of right femur, subsequent encounter for closed fracture with routine healing: Secondary | ICD-10-CM

## 2023-02-05 DIAGNOSIS — Z8659 Personal history of other mental and behavioral disorders: Secondary | ICD-10-CM

## 2023-02-05 DIAGNOSIS — Z96651 Presence of right artificial knee joint: Secondary | ICD-10-CM

## 2023-02-05 DIAGNOSIS — M5136 Other intervertebral disc degeneration, lumbar region: Secondary | ICD-10-CM

## 2023-02-06 NOTE — Progress Notes (Signed)
Office Visit Note  Patient: Laura Chambers             Date of Birth: 1940-10-13           MRN: [id number]             PCP: Willow Ora, MD Referring: Willow Ora, MD Visit Date: 02/20/2023 Occupation: @GUAROCC @  Subjective:  Medication management  History of Present Illness: Laura Chambers is a 82 y.o. female with seropositive rheumatoid arthritis, osteoarthritis, fibromyalgia, osteoporosis and degenerative disc disease.  Patient states she has been canning green beans and started experiencing some discomfort in her hands.  She has not noticed any joint swelling.  She continues to have discomfort in her right leg since the fracture.  Her knee joint replacements are doing well.  She continues to have lower back pain.  She has been followed by neurosurgery.  She has left foot drop which is unchanged.  She has been ambulating with the help of a walker.  She has generalized pain and discomfort from fibromyalgia.  She has been taking hydroxychloroquine on a regular basis without any interruption.  She denies any side effects from hydroxychloroquine.    Activities of Daily Living:  Patient reports morning stiffness for a few minutes.   Patient Reports nocturnal pain.  Difficulty dressing/grooming: Denies Difficulty climbing stairs: Reports Difficulty getting out of chair: Reports Difficulty using hands for taps, buttons, cutlery, and/or writing: Reports  Review of Systems  Constitutional:  Negative for fatigue.  HENT:  Negative for mouth sores and mouth dryness.   Eyes:  Negative for dryness.  Respiratory:  Negative for shortness of breath.   Cardiovascular:  Negative for chest pain and palpitations.  Gastrointestinal:  Negative for blood in stool, constipation and diarrhea.  Endocrine: Negative for increased urination.  Genitourinary:  Negative for involuntary urination.  Musculoskeletal:  Positive for joint pain, gait problem, joint pain and morning stiffness. Negative  for joint swelling, myalgias, muscle weakness, muscle tenderness and myalgias.  Skin:  Negative for color change, rash, hair loss and sensitivity to sunlight.  Allergic/Immunologic: Negative for susceptible to infections.  Neurological:  Positive for dizziness. Negative for headaches.  Hematological:  Negative for swollen glands.  Psychiatric/Behavioral:  Negative for depressed mood and sleep disturbance. The patient is not nervous/anxious.     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   Chronic kidney disease (CKD), active medical management without dialysis, stage 3 (moderate) 12/15/2017   Fibromyalgia 12/15/2017   History of total right hip replacement 12/15/2017   DDD (degenerative disc disease), lumbar 12/15/2017   S/P total knee replacement, right 12/15/2017   Left foot drop 12/15/2017   Reactive depression (situational) 08/17/2017   Status post revision of total knee, left 06/09/2017   Essential hypertension 12/08/2016   Urine retention 12/02/2016   Trigeminal neuralgia of right side of face 08/19/2015   Peripheral neuropathy 01/12/2014   Dyslipidemia 06/16/2012    Past Medical History:  Diagnosis Date   Anemia  Anxiety    Arthritis    Atelectasis, left    left lower lung   Blood transfusion without reported diagnosis    Cataract cortical, senile, bilateral 03/19/2018   Complication of anesthesia 2012   "irritated trachea"  with cough x 1 year from intubation 2012.  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)    Osteoporosis    Renal angiomyolipoma    Rheumatoid arthritis (HCC)     Family History  Problem Relation Age of Onset   Diabetes Father    Osteoarthritis Father    Heart disease Father    Stroke Father    Osteoarthritis Mother    Stroke Mother    Pneumonia Mother    Colon cancer Sister 28   Colon polyps Other        neice/nephew   Diabetes Sister    Diabetes Brother    Past Surgical History:  Procedure Laterality Date   ABDOMINAL HYSTERECTOMY  1969   BACK SURGERY  2012   rod in back-  lumbar disc removal with bone graft   blephoroplasty     bilateral   BREAST BIOPSY  1989   CATARACT EXTRACTION Bilateral    05/2020, 06/2020   CATARACT EXTRACTION, BILATERAL     CHOLECYSTECTOMY  2010   COLONOSCOPY  2022   with polypectomy   EXCISIONAL TOTAL KNEE ARTHROPLASTY WITH ANTIBIOTIC SPACERS Left 11/14/2016   Procedure: EXCISIONAL LEFT TOTAL KNEE ARTHROPLASTY WITH  PLACEMENT OF ANTIBIOTIC SPACERS;  Surgeon: Kathryne Hitch, MD;  Location: [facility];  Service: Orthopedics;  Laterality: Left;   JOINT REPLACEMENT     NISSEN FUNDOPLICATION     ORIF FEMUR FRACTURE Right 01/04/2020   Procedure: OPEN REDUCTION INTERNAL FIXATION (ORIF) DISTAL FEMUR FRACTURE;  Surgeon: Roby Lofts, MD;  Location: [facility];  Service: Orthopedics;  Laterality: Right;   PARAESOPHAGEAL HERNIA REPAIR  1989   REVISION TOTAL KNEE ARTHROPLASTY Left 06/09/2017   TONSILLECTOMY  1946   TOTAL HIP ARTHROPLASTY Right  08/31/2012   Procedure: TOTAL HIP ARTHROPLASTY ANTERIOR APPROACH;  Surgeon: Shelda Pal, MD;  Location: [facility];  Service: Orthopedics;  Laterality: Right;   TOTAL KNEE ARTHROPLASTY  2001   left   TOTAL KNEE ARTHROPLASTY  2010   right   TOTAL KNEE REVISION Left 06/09/2017   Procedure: LEFT REVISION KNEE ARTHROPLASTY;  Surgeon: Kathryne Hitch, MD;  Location: [facility];  Service: Orthopedics;  Laterality: Left;   Social History   Social History Narrative   Patient lives at home alone.   Caffeine Use: 1 cup daily   Immunization History  Administered Date(s) Administered   Fluad Quad(high Dose 65+) 04/26/2019, 05/08/2020, 04/02/2022   Influenza, High Dose Seasonal PF 03/27/2018   Influenza,inj,Quad PF,6+ Mos 03/13/2017   Influenza-Unspecified 03/26/2018, 06/04/2020, 07/18/2021   PFIZER(Purple Top)SARS-COV-2 Vaccination 09/02/2019, 09/30/2019, 05/08/2020   Pneumococcal Conjugate-13 02/26/2018   Pneumococcal Polysaccharide-23 05/17/2019     Objective: Vital Signs: BP 126/61 (BP Location: Left Arm, Patient Position: Sitting, Cuff  Size: Large)   Pulse (!) 54   Resp 13   Ht 5\' 7"  (1.702 m)   Wt 208 lb 12.8 oz (94.7 kg)   BMI 32.70 kg/m    Physical Exam Vitals and nursing note reviewed.  Constitutional:      Appearance: She is well-developed.  HENT:     Head: Normocephalic and atraumatic.  Eyes:     Conjunctiva/sclera: Conjunctivae normal.  Cardiovascular:     Rate and Rhythm: Normal rate and regular rhythm.     Heart sounds: Normal heart sounds.  Pulmonary:     Effort: Pulmonary effort is normal.     Breath sounds: Normal breath sounds.  Abdominal:     General: Bowel sounds are normal.     Palpations: Abdomen is soft.  Musculoskeletal:     Cervical back: Normal range of motion.  Lymphadenopathy:     Cervical: No cervical adenopathy.  Skin:    General: Skin is warm and dry.     Capillary Refill: Capillary refill takes less than 2 seconds.  Neurological:      Mental Status: She is alert and oriented to person, place, and time.  Psychiatric:        Behavior: Behavior normal.      Musculoskeletal Exam: 7 in the seated position.  Cervical spine was in good range of motion.  Thoracic kyphosis was noted.  She had no tenderness over thoracic or lumbar spine.  Shoulders, elbows, wrist joints, MCPs PIPs and DIPs with good range of motion.  PIP and DIP thickening with no synovitis was noted.  Hip joints were difficult to assess in the seated position.  Knee joints were in good range of motion which were replaced.  She had left foot drop.  There was no tenderness over ankles or MTPs.  CDAI Exam: CDAI Score: -- Patient Global: 20 / 100; Provider Global: 20 / 100 Swollen: --; Tender: -- Joint Exam 02/20/2023   No joint exam has been documented for this visit   There is currently no information documented on the homunculus. Go to the Rheumatology activity and complete the homunculus joint exam.  Investigation: No additional findings.  Imaging: No results found.  Recent Labs: Lab Results  Component Value Date   WBC 7.3 08/13/2022   HGB 14.1 08/13/2022   PLT 191 08/13/2022   NA 142 12/19/2022   K 4.1 12/19/2022   CL 102 12/19/2022   CO2 33 (H) 12/19/2022   GLUCOSE 89 12/19/2022   BUN 29 (H) 12/19/2022   CREATININE 1.37 (H) 12/19/2022   BILITOT 1.1 08/13/2022   ALKPHOS 87 05/19/2022   AST 23 08/13/2022   ALT 14 08/13/2022   PROT 7.0 08/13/2022   ALBUMIN 3.9 05/19/2022   CALCIUM 9.2 12/19/2022   GFRAA 45 (L) 12/31/2020    Speciality Comments: PLQ Eye Exam 10/29/2022 WNL @ [facility]  f/u 1 year  Procedures:  No procedures performed Allergies: Ativan [lorazepam], Haldol [haloperidol], Epinephrine, Levofloxacin, Lipitor [atorvastatin], Losartan, Robaxin [methocarbamol], and Trovan [alatrofloxacin]   Assessment / Plan:     Visit Diagnoses: Rheumatoid arthritis involving multiple sites with positive rheumatoid factor (HCC) - Severe  rheumatoid arthritis noted on the x-rays in the past.  Patient had no synovitis on the examination.  She has been having increased discomfort in her hands after canning green beans.  She has osteoarthritic changes which may be contributing to the discomfort.  She has been taking hydroxychloroquine on a regular basis without any interruption.  High risk  medication use - Plaquenil 200 mg 1 tablet by mouth daily. PLQ Eye Exam 10/29/2022 -Labs obtained on August 13, 2022 CBC and CMP were normal with creatinine slightly elevated.  December 19, 2022 creatinine was elevated at 1.37.  Plan: CBC with Differential/Platelet, COMPLETE METABOLIC PANEL WITH GFR today.  Eye examination on October 29, 2022 was normal.  Information on immunization was placed in the AVS.  Displaced supracondylar fracture without intracondylar extension of lower end of right femur, subsequent encounter for closed fracture with routine healing -patient states she is still not able to heal completely.  Followed by Dr. Jena Gauss.  History of total right hip replacement - Followed by Dr. Jena Gauss after right femur fracture in June 2021.  S/P total knee replacement, right -right total knee replacement is doing well.    Status post revision of total knee, left -she denies any discomfort in the left knee joint.  Revision 2018 by Dr. Magnus Ivan.  Infected prosthetic knee joint, sequela  DDD (degenerative disc disease), lumbar-she continues to have intermittent lower back pain.  Left foot drop-change.  Age-related osteoporosis without current pathological fracture - February 08, 2021 DEXA scan showed a T score of -3.1 in the left radius.  I did detailed discussion with the patient and her son Nida Boatman regarding osteoporosis.  Different treatment options and their side effects were discussed.  Patient is hesitant to take any bisphosphonates.  She is interested in Prolia subcu injections.  She would like to have Prolia injections through her PCPs office.  DEXA  scan is pending at this point.  I gave her information on osteoporosis and Prolia injections to review.  Fibromyalgia-she continues to have generalized pain and discomfort.  Other medical problems listed as follows:  Brain mass - December 2022 the patient experienced an episode of diplopia and was evaluated by her ophthalmologist Dr. Elmer Picker urgently.  She is closely followed by oncology.  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    Follow-Up Instructions: Return in about 5 months (around 07/23/2023) for Rheumatoid arthritis.   Pollyann Savoy, MD  Note - This record has been created using Animal nutritionist.  Chart creation errors have been sought, but may not always  have been located. Such creation errors do not reflect on  the standard of medical care.

## 2023-02-20 ENCOUNTER — Ambulatory Visit: Payer: PPO | Attending: Rheumatology | Admitting: Rheumatology

## 2023-02-20 ENCOUNTER — Encounter: Payer: Self-pay | Admitting: Rheumatology

## 2023-02-20 VITALS — BP 126/61 | HR 54 | Resp 13 | Ht 67.0 in | Wt 208.8 lb

## 2023-02-20 DIAGNOSIS — Z8669 Personal history of other diseases of the nervous system and sense organs: Secondary | ICD-10-CM

## 2023-02-20 DIAGNOSIS — Z96641 Presence of right artificial hip joint: Secondary | ICD-10-CM | POA: Diagnosis not present

## 2023-02-20 DIAGNOSIS — M5136 Other intervertebral disc degeneration, lumbar region: Secondary | ICD-10-CM

## 2023-02-20 DIAGNOSIS — Z96652 Presence of left artificial knee joint: Secondary | ICD-10-CM | POA: Diagnosis not present

## 2023-02-20 DIAGNOSIS — M21372 Foot drop, left foot: Secondary | ICD-10-CM

## 2023-02-20 DIAGNOSIS — Z79899 Other long term (current) drug therapy: Secondary | ICD-10-CM

## 2023-02-20 DIAGNOSIS — M797 Fibromyalgia: Secondary | ICD-10-CM

## 2023-02-20 DIAGNOSIS — Z96651 Presence of right artificial knee joint: Secondary | ICD-10-CM | POA: Diagnosis not present

## 2023-02-20 DIAGNOSIS — E785 Hyperlipidemia, unspecified: Secondary | ICD-10-CM

## 2023-02-20 DIAGNOSIS — S72451D Displaced supracondylar fracture without intracondylar extension of lower end of right femur, subsequent encounter for closed fracture with routine healing: Secondary | ICD-10-CM

## 2023-02-20 DIAGNOSIS — Z8659 Personal history of other mental and behavioral disorders: Secondary | ICD-10-CM

## 2023-02-20 DIAGNOSIS — M0579 Rheumatoid arthritis with rheumatoid factor of multiple sites without organ or systems involvement: Secondary | ICD-10-CM | POA: Diagnosis not present

## 2023-02-20 DIAGNOSIS — G9389 Other specified disorders of brain: Secondary | ICD-10-CM

## 2023-02-20 DIAGNOSIS — M810 Age-related osteoporosis without current pathological fracture: Secondary | ICD-10-CM | POA: Diagnosis not present

## 2023-02-20 DIAGNOSIS — T8459XS Infection and inflammatory reaction due to other internal joint prosthesis, sequela: Secondary | ICD-10-CM | POA: Diagnosis not present

## 2023-02-20 DIAGNOSIS — D1771 Benign lipomatous neoplasm of kidney: Secondary | ICD-10-CM

## 2023-02-20 DIAGNOSIS — Z860100 Personal history of colon polyps, unspecified: Secondary | ICD-10-CM

## 2023-02-20 DIAGNOSIS — N183 Chronic kidney disease, stage 3 unspecified: Secondary | ICD-10-CM

## 2023-02-20 DIAGNOSIS — Z96659 Presence of unspecified artificial knee joint: Secondary | ICD-10-CM

## 2023-02-20 DIAGNOSIS — I10 Essential (primary) hypertension: Secondary | ICD-10-CM

## 2023-02-20 DIAGNOSIS — M51369 Other intervertebral disc degeneration, lumbar region without mention of lumbar back pain or lower extremity pain: Secondary | ICD-10-CM

## 2023-02-20 NOTE — Patient Instructions (Addendum)
Vaccines You are taking a medication(s) that can suppress your immune system.  The following immunizations are recommended: Flu annually Covid-19  Td/Tdap (tetanus, diphtheria, pertussis) every 10 years Pneumonia (Prevnar 15 then Pneumovax 23 at least 1 year apart.  Alternatively, can take Prevnar 20 without needing additional dose) Shingrix: 2 doses from 4 weeks to 6 months apart  Please check with your PCP to make sure you are up to date.   Denosumab Injection (Osteoporosis) What is this medication? DENOSUMAB (den oh SUE mab) prevents and treats osteoporosis. It works by Interior and spatial designer stronger and less likely to break (fracture). It is a monoclonal antibody. This medicine may be used for other purposes; ask your health care provider or pharmacist if you have questions. COMMON BRAND NAME(S): Prolia What should I tell my care team before I take this medication? They need to know if you have any of these conditions: Dental or gum disease Had thyroid or parathyroid (glands located in neck) surgery Having dental surgery or a tooth pulled Kidney disease Low levels of calcium in the blood On dialysis Poor nutrition Thyroid disease Trouble absorbing nutrients from your food An unusual or allergic reaction to denosumab, other medications, foods, dyes, or preservatives Pregnant or trying to get pregnant Breastfeeding How should I use this medication? This medication is injected under the skin. It is given by your care team in a hospital or clinic setting. A special MedGuide will be given to you before each treatment. Be sure to read this information carefully each time. Talk to your care team about the use of this medication in children. Special care may be needed. Overdosage: If you think you have taken too much of this medicine contact a poison control center or emergency room at once. NOTE: This medicine is only for you. Do not share this medicine with others. What if I miss a  dose? Keep appointments for follow-up doses. It is important not to miss your dose. Call your care team if you are unable to keep an appointment. What may interact with this medication? Do not take this medication with any of the following: Other medications that contain denosumab This medication may also interact with the following: Medications that lower your chance of fighting infection Steroid medications, such as prednisone or cortisone This list may not describe all possible interactions. Give your health care provider a list of all the medicines, herbs, non-prescription drugs, or dietary supplements you use. Also tell them if you smoke, drink alcohol, or use illegal drugs. Some items may interact with your medicine. What should I watch for while using this medication? Your condition will be monitored carefully while you are receiving this medication. You may need blood work done while taking this medication. This medication may increase your risk of getting an infection. Call your care team for advice if you get a fever, chills, sore throat, or other symptoms of a cold or flu. Do not treat yourself. Try to avoid being around people who are sick. Tell your dentist and dental surgeon that you are taking this medication. You should not have major dental surgery while on this medication. See your dentist to have a dental exam and fix any dental problems before starting this medication. Take good care of your teeth while on this medication. Make sure you see your dentist for regular follow-up appointments. This medication may cause low levels of calcium in your body. The risk of severe side effects is increased in people with kidney disease. Your  care team may prescribe calcium and vitamin D to help prevent low calcium levels while you take this medication. It is important to take calcium and vitamin D as directed by your care team. Talk to your care team if you may be pregnant. Serious birth  defects may occur if you take this medication during pregnancy and for 5 months after the last dose. You will need a negative pregnancy test before starting this medication. Contraception is recommended while taking this medication and for 5 months after the last dose. Your care team can help you find the option that works for you. Talk to your care team before breastfeeding. Changes to your treatment plan may be needed. What side effects may I notice from receiving this medication? Side effects that you should report to your care team as soon as possible: Allergic reactions--skin rash, itching, hives, swelling of the face, lips, tongue, or throat Infection--fever, chills, cough, sore throat, wounds that don't heal, pain or trouble when passing urine, general feeling of discomfort or being unwell Low calcium level--muscle pain or cramps, confusion, tingling, or numbness in the hands or feet Osteonecrosis of the jaw--pain, swelling, or redness in the mouth, numbness of the jaw, poor healing after dental work, unusual discharge from the mouth, visible bones in the mouth Severe bone, joint, or muscle pain Skin infection--skin redness, swelling, warmth, or pain Side effects that usually do not require medical attention (report these to your care team if they continue or are bothersome): Back pain Headache Joint pain Muscle pain Pain in the hands, arms, legs, or feet Runny or stuffy nose Sore throat This list may not describe all possible side effects. Call your doctor for medical advice about side effects. You may report side effects to FDA at 1-800-FDA-1088. Where should I keep my medication? This medication is given in a hospital or clinic. It will not be stored at home. NOTE: This sheet is a summary. It may not cover all possible information. If you have questions about this medicine, talk to your doctor, pharmacist, or health care provider.  2024 Elsevier/Gold Standard (2022-07-29  00:00:00)  Osteoporosis  Osteoporosis happens when the bones become thin and less dense than normal. Osteoporosis makes bones more brittle and fragile and more likely to break (fracture). Over time, osteoporosis can cause your bones to become so weak that they fracture after a minor fall. Bones in the hip, wrist, and spine are most likely to fracture due to osteoporosis. What are the causes? The exact cause of this condition is not known. What increases the risk? You are more likely to develop this condition if you: Have family members with this condition. Have poor nutrition. Use the following: Steroid medicines, such as prednisone. Anti-seizure medicines. Nicotine or tobacco, such as cigarettes, e-cigarettes, and chewing tobacco. Are female. Are age 85 or older. Are not physically active (are sedentary). Are of European or Asian descent. Have a small body frame. What are the signs or symptoms? A fracture might be the first sign of osteoporosis, especially if the fracture results from a fall or injury that usually would not cause a bone to break. Other signs and symptoms include: Pain in the neck or low back. Stooped posture. Loss of height. How is this diagnosed? This condition may be diagnosed based on: Your medical history. A physical exam. A bone mineral density test, also called a DXA or DEXA test (dual-energy X-ray absorptiometry test). This test uses X-rays to measure the amount of minerals in your bones.  How is this treated? This condition may be treated by: Making lifestyle changes, such as: Including foods with more calcium and vitamin D in your diet. Doing weight-bearing and muscle-strengthening exercises. Stopping tobacco use. Limiting alcohol intake. Taking medicine to slow the process of bone loss or to increase bone density. Taking daily supplements of calcium and vitamin D. Taking hormone replacement medicines, such as estrogen for women and testosterone for  men. Monitoring your levels of calcium and vitamin D. The goal of treatment is to strengthen your bones and lower your risk for a fracture. Follow these instructions at home: Eating and drinking Include calcium and vitamin D in your diet. Calcium is important for bone health, and vitamin D helps your body absorb calcium. Good sources of calcium and vitamin D include: Certain fatty fish, such as salmon and tuna. Products that have calcium and vitamin D added to them (are fortified), such as fortified cereals. Egg yolks. Cheese. Liver.  Activity Do exercises as told by your health care provider. Ask your health care provider what exercises and activities are safe for you. You should do: Exercises that make you work against gravity (weight-bearing exercises), such as tai chi, yoga, or walking. Exercises to strengthen muscles, such as lifting weights. Lifestyle Do not drink alcohol if: Your health care provider tells you not to drink. You are pregnant, may be pregnant, or are planning to become pregnant. If you drink alcohol: Limit how much you use to: 0-1 drink a day for women. 0-2 drinks a day for men. Know how much alcohol is in your drink. In the [place] one drink equals one 12 oz bottle of beer (355 mL), one 5 oz glass of wine (148 mL), or one 1 oz glass of hard liquor (44 mL). Do not use any products that contain nicotine or tobacco, such as cigarettes, e-cigarettes, and chewing tobacco. If you need help quitting, ask your health care provider. Preventing falls Use devices to help you move around (mobility aids) as needed, such as canes, walkers, scooters, or crutches. Keep rooms well-lit and clutter-free. Remove tripping hazards from walkways, including cords and throw rugs. Install grab bars in bathrooms and safety rails on stairs. Use rubber mats in the bathroom and other areas that are often wet or slippery. Wear closed-toe shoes that fit well and support your feet. Wear shoes  that have rubber soles or low heels. Review your medicines with your health care provider. Some medicines can cause dizziness or changes in blood pressure, which can increase your risk of falling. General instructions Take over-the-counter and prescription medicines only as told by your health care provider. Keep all follow-up visits. This is important. Contact a health care provider if: You have never been screened for osteoporosis and you are: A woman who is age 52 or older. A man who is age 67 or older. Get help right away if: You fall or injure yourself. Summary Osteoporosis is thinning and loss of density in your bones. This makes bones more brittle and fragile and more likely to break (fracture),even with minor falls. The goal of treatment is to strengthen your bones and lower your risk for a fracture. Include calcium and vitamin D in your diet. Calcium is important for bone health, and vitamin D helps your body absorb calcium. Talk with your health care provider about screening for osteoporosis if you are a woman who is age 73 or older, or a man who is age 69 or older. This information is not intended to  replace advice given to you by your health care provider. Make sure you discuss any questions you have with your health care provider. Document Revised: 12/08/2019 Document Reviewed: 12/08/2019 Elsevier Patient Education  2024 [employer].

## 2023-02-21 LAB — CBC WITH DIFFERENTIAL/PLATELET
Absolute Monocytes: 755 {cells}/uL (ref 200–950)
Basophils Absolute: 64 {cells}/uL (ref 0–200)
Basophils Relative: 0.7 %
Eosinophils Absolute: 428 {cells}/uL (ref 15–500)
Eosinophils Relative: 4.7 %
HCT: 41.7 % (ref 35.0–45.0)
Hemoglobin: 13.5 g/dL (ref 11.7–15.5)
Lymphs Abs: 1838 {cells}/uL (ref 850–3900)
MCH: 29.4 pg (ref 27.0–33.0)
MCHC: 32.4 g/dL (ref 32.0–36.0)
MCV: 90.8 fL (ref 80.0–100.0)
MPV: 12.7 fL — ABNORMAL HIGH (ref 7.5–12.5)
Monocytes Relative: 8.3 %
Neutro Abs: 6015 {cells}/uL (ref 1500–7800)
Neutrophils Relative %: 66.1 %
Platelets: 178 10*3/uL (ref 140–400)
RBC: 4.59 10*6/uL (ref 3.80–5.10)
RDW: 12.7 % (ref 11.0–15.0)
Total Lymphocyte: 20.2 %
WBC: 9.1 10*3/uL (ref 3.8–10.8)

## 2023-02-21 LAB — COMPLETE METABOLIC PANEL WITH GFR
AG Ratio: 1.5 (calc) (ref 1.0–2.5)
ALT: 13 U/L (ref 6–29)
AST: 22 U/L (ref 10–35)
Albumin: 4 g/dL (ref 3.6–5.1)
Alkaline phosphatase (APISO): 73 U/L (ref 37–153)
BUN/Creatinine Ratio: 24 (calc) — ABNORMAL HIGH (ref 6–22)
BUN: 32 mg/dL — ABNORMAL HIGH (ref 7–25)
CO2: 30 mmol/L (ref 20–32)
Calcium: 9.8 mg/dL (ref 8.6–10.4)
Chloride: 102 mmol/L (ref 98–110)
Creat: 1.32 mg/dL — ABNORMAL HIGH (ref 0.60–0.95)
Globulin: 2.6 g/dL (ref 1.9–3.7)
Glucose, Bld: 89 mg/dL (ref 65–99)
Potassium: 4.4 mmol/L (ref 3.5–5.3)
Sodium: 141 mmol/L (ref 135–146)
Total Bilirubin: 1 mg/dL (ref 0.2–1.2)
Total Protein: 6.6 g/dL (ref 6.1–8.1)
eGFR: 40 mL/min/{1.73_m2} — ABNORMAL LOW (ref >=60)

## 2023-02-21 NOTE — Progress Notes (Signed)
CBC is normal.  Creatinine stays elevated and stable most likely due to the use of diuretics.  Please forward results to her PCP.

## 2023-04-22 ENCOUNTER — Other Ambulatory Visit: Payer: Self-pay | Admitting: Family Medicine

## 2023-04-22 DIAGNOSIS — F329 Major depressive disorder, single episode, unspecified: Secondary | ICD-10-CM

## 2023-04-22 DIAGNOSIS — E785 Hyperlipidemia, unspecified: Secondary | ICD-10-CM

## 2023-04-23 ENCOUNTER — Other Ambulatory Visit: Payer: Self-pay | Admitting: Rheumatology

## 2023-04-23 NOTE — Telephone Encounter (Signed)
Last Fill: 01/26/2023  Eye exam: 10/29/2022 WNL    Labs: 02/20/2023 CBC is normal.  Creatinine stays elevated and stable most likely due to the use of diuretics.   Next Visit: 07/23/2023  Last Visit: 02/20/2023  [id number] arthritis involving multiple sites with positive rheumatoid factor   Current Dose per office note 02/20/2023: Plaquenil 200 mg 1 tablet by mouth daily   Okay to refill Plaquenil?

## 2023-05-06 ENCOUNTER — Telehealth: Payer: Self-pay | Admitting: Family Medicine

## 2023-05-06 ENCOUNTER — Other Ambulatory Visit: Payer: Self-pay | Admitting: Family Medicine

## 2023-05-06 NOTE — Telephone Encounter (Signed)
Prescription Request  05/06/2023  LOV: 12/19/2022  What is the name of the medication or equipment?  gabapentin (NEURONTIN) 300 MG capsule   Have you contacted your pharmacy to request a refill? No   Which pharmacy would you like this sent to? [facility] [phone] - [place], [place] - [address] [place] [address] Phone: [phone] Fax: [phone]   Patient notified that their request is being sent to the clinical staff for review and that they should receive a response within 2 business days.   Please advise at Mobile [phone] (mobile)

## 2023-05-06 NOTE — Telephone Encounter (Signed)
Rx sent 

## 2023-05-22 ENCOUNTER — Encounter: Payer: PPO | Admitting: Family Medicine

## 2023-06-15 DIAGNOSIS — D480 Neoplasm of uncertain behavior of bone and articular cartilage: Secondary | ICD-10-CM | POA: Diagnosis not present

## 2023-06-15 DIAGNOSIS — H4922 Sixth [abducent] nerve palsy, left eye: Secondary | ICD-10-CM | POA: Diagnosis not present

## 2023-07-09 NOTE — Progress Notes (Signed)
Office Visit Note  Patient: Laura Chambers             Date of Birth: 11/14/1940           MRN: [id number]             PCP: Willow Ora, MD Referring: Willow Ora, MD Visit Date: 07/23/2023 Occupation: @GUAROCC @  Subjective:  Medication management  History of Present Illness: SIDNEI WORMALD is a 83 y.o. female with seropositive rheumatoid arthritis, osteoarthritis, degenerative disc disease, osteoporosis and fibromyalgia.  Patient denies any joint swelling.  She states she has been taking hydroxychloroquine on a regular basis.  She continues to have some pain and stiffness in her knee joints and her feet.  Her knee joints are replaced.  She also has left foot drop which causes difficulty with ambulation.  She ambulates with the help of a walker.  She was accompanied by her daughter Velna Hatchet today.  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  She decided not to have surgery.    Activities of Daily Living:  Patient reports morning stiffness for 0 minute.   Patient Reports nocturnal pain.  Difficulty dressing/grooming: Denies Difficulty climbing stairs: Reports Difficulty getting out of chair: Reports Difficulty using hands for taps, buttons, cutlery, and/or writing: Denies  Review of Systems  Constitutional:  Positive for fatigue.  HENT:  Positive for mouth dryness. Negative for mouth sores.   Eyes:  Positive for dryness.  Respiratory:  Negative for shortness of breath.   Cardiovascular:  Negative for chest pain and palpitations.  Gastrointestinal:  Positive for diarrhea. Negative for blood in stool and constipation.  Endocrine: Negative for increased urination.  Genitourinary:  Negative for involuntary urination.  Musculoskeletal:  Positive for joint pain, gait problem, joint pain, myalgias and myalgias. Negative for joint swelling, muscle weakness, morning stiffness and muscle tenderness.   Skin:  Negative for color change, rash, hair loss and sensitivity to sunlight.  Allergic/Immunologic: Negative for susceptible to infections.  Neurological:  Positive for dizziness. Negative for headaches.  Hematological:  Negative for swollen glands.  Psychiatric/Behavioral:  Negative for depressed mood and sleep disturbance. The patient is not nervous/anxious.     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   Chronic kidney disease (CKD), active medical management without dialysis, stage 3 (moderate) 12/15/2017   Fibromyalgia 12/15/2017   History of total right hip replacement 12/15/2017   DDD (degenerative disc disease), lumbar 12/15/2017   S/P total knee replacement, right 12/15/2017   Left foot drop 12/15/2017   Reactive depression (situational) 08/17/2017   Status post revision of total knee, left 06/09/2017   Essential hypertension 12/08/2016   Urine retention 12/02/2016   Trigeminal neuralgia of right side of face 08/19/2015   Peripheral neuropathy 01/12/2014   Dyslipidemia 06/16/2012    Past Medical History:  Diagnosis Date   Anemia    Anxiety    Arthritis  Atelectasis, left    left lower lung   Blood transfusion without reported diagnosis    Cataract cortical, senile, bilateral 03/19/2018   Complication of anesthesia 2012   "irritated trachea" with cough x 1 year from intubation 2012.  smaller tube used with other  surgeries   Depression    just lost her son a month ago   Esophageal hernia    Fibromyalgia    Headache    occasionally   HTN (hypertension)    Hx of colonic polyps    Hydronephrosis of right kidney    Hypercholesterolemia    Hypertensive retinopathy of both eyes 03/19/2018   Neuropathy due to medical condition (HCC)    bilateral lower legs   Obesity    Osteoarthritis (arthritis due to wear and tear of joints)    Osteoporosis    Renal angiomyolipoma    Rheumatoid arthritis (HCC)     Family History  Problem Relation Age of Onset   Diabetes Father    Osteoarthritis Father    Heart disease Father    Stroke Father    Osteoarthritis Mother    Stroke Mother    Pneumonia Mother    Colon cancer Sister 1   Colon polyps Other        neice/nephew   Diabetes Sister    Diabetes Brother    Past Surgical History:  Procedure Laterality Date   ABDOMINAL HYSTERECTOMY  1969   BACK SURGERY  2012   rod in back-  lumbar disc removal with bone graft   blephoroplasty     bilateral   BREAST BIOPSY  1989   CATARACT EXTRACTION Bilateral    05/2020, 06/2020   CATARACT EXTRACTION, BILATERAL     CHOLECYSTECTOMY  2010   COLONOSCOPY  2022   with polypectomy   EXCISIONAL TOTAL KNEE ARTHROPLASTY WITH ANTIBIOTIC SPACERS Left 11/14/2016   Procedure: EXCISIONAL LEFT TOTAL KNEE ARTHROPLASTY WITH  PLACEMENT OF ANTIBIOTIC SPACERS;  Surgeon: Kathryne Hitch, MD;  Location: [facility];  Service: Orthopedics;  Laterality: Left;   JOINT REPLACEMENT     NISSEN FUNDOPLICATION     ORIF FEMUR FRACTURE Right 01/04/2020   Procedure: OPEN REDUCTION INTERNAL FIXATION (ORIF) DISTAL FEMUR FRACTURE;  Surgeon: Roby Lofts, MD;  Location: [facility];  Service: Orthopedics;  Laterality: Right;   PARAESOPHAGEAL HERNIA REPAIR  1989   REVISION TOTAL KNEE ARTHROPLASTY Left 06/09/2017   TONSILLECTOMY  1946   TOTAL HIP ARTHROPLASTY Right 08/31/2012   Procedure: TOTAL HIP ARTHROPLASTY ANTERIOR APPROACH;  Surgeon:  Shelda Pal, MD;  Location: [facility];  Service: Orthopedics;  Laterality: Right;   TOTAL KNEE ARTHROPLASTY  2001   left   TOTAL KNEE ARTHROPLASTY  2010   right   TOTAL KNEE REVISION Left 06/09/2017   Procedure: LEFT REVISION KNEE ARTHROPLASTY;  Surgeon: Kathryne Hitch, MD;  Location: [facility];  Service: Orthopedics;  Laterality: Left;   Social History   Social History Narrative   Patient lives at home alone.   Caffeine Use: 1 cup daily   Immunization History  Administered Date(s) Administered   Fluad Quad(high Dose 65+) 04/26/2019, 05/08/2020, 04/02/2022   Influenza, High Dose Seasonal PF 03/27/2018, 05/18/2023   Influenza,inj,Quad PF,6+ Mos 03/13/2017   Influenza-Unspecified 03/26/2018, 06/04/2020, 07/18/2021   PFIZER(Purple Top)SARS-COV-2 Vaccination 09/02/2019, 09/30/2019, 05/08/2020   Pneumococcal Conjugate-13 02/26/2018   Pneumococcal Polysaccharide-23 05/17/2019     Objective: Vital Signs: BP (!) 141/79 (BP Location: Left Arm, Patient Position: Sitting, Cuff Size: Large)   Pulse Marland Kitchen)  57   Resp 14   Ht 5\' 7"  (1.702 m)   Wt 214 lb (97.1 kg)   BMI 33.52 kg/m    Physical Exam Vitals and nursing note reviewed.  Constitutional:      Appearance: She is well-developed.  HENT:     Head: Normocephalic and atraumatic.  Eyes:     Conjunctiva/sclera: Conjunctivae normal.  Cardiovascular:     Rate and Rhythm: Normal rate and regular rhythm.     Heart sounds: Normal heart sounds.  Pulmonary:     Effort: Pulmonary effort is normal.     Breath sounds: Normal breath sounds.  Abdominal:     General: Bowel sounds are normal.     Palpations: Abdomen is soft.  Musculoskeletal:     Cervical back: Normal range of motion.  Lymphadenopathy:     Cervical: No cervical adenopathy.  Skin:    General: Skin is warm and dry.     Capillary Refill: Capillary refill takes less than 2 seconds.  Neurological:     Mental Status: She is alert and oriented to person, place, and time.   Psychiatric:        Behavior: Behavior normal.      Musculoskeletal Exam: Patient was examined in the seated position.  Cervical spine was in good range of motion.  There was no tenderness over thoracic or lumbar spine.  Shoulders, elbows, wrist, MCPs PIPs and DIPs with good range of motion with no synovitis.  Bilateral PIP and DIP thickening was noted.  Hip joints could not be assessed in the seated position.  Knee joints were in good range of motion which were replaced.  No warmth swelling or effusion was noted.  She had good range of motion of her ankles.  There was no tenderness over ankles or MTPs.  She ambulates with the help of a walker.  Left foot drop was noted.  CDAI Exam: CDAI Score: -- Patient Global: --; Provider Global: -- Swollen: --; Tender: -- Joint Exam 07/23/2023   No joint exam has been documented for this visit   There is currently no information documented on the homunculus. Go to the Rheumatology activity and complete the homunculus joint exam.  Investigation: No additional findings.  Imaging: No results found.  Recent Labs: Lab Results  Component Value Date   WBC 9.1 02/20/2023   HGB 13.5 02/20/2023   PLT 178 02/20/2023   NA 141 02/20/2023   K 4.4 02/20/2023   CL 102 02/20/2023   CO2 30 02/20/2023   GLUCOSE 89 02/20/2023   BUN 32 (H) 02/20/2023   CREATININE 1.32 (H) 02/20/2023   BILITOT 1.0 02/20/2023   ALKPHOS 87 05/19/2022   AST 22 02/20/2023   ALT 13 02/20/2023   PROT 6.6 02/20/2023   ALBUMIN 3.9 05/19/2022   CALCIUM 9.8 02/20/2023   GFRAA 45 (L) 12/31/2020    Speciality Comments: PLQ Eye Exam 10/29/2022 WNL @ [facility]  f/u 1 year  Procedures:  No procedures performed Allergies: Ativan [lorazepam], Haldol [haloperidol], Epinephrine, Levofloxacin, Lipitor [atorvastatin], Losartan, Robaxin [methocarbamol], and Trovan [alatrofloxacin]   Assessment / Plan:     Visit Diagnoses: Rheumatoid arthritis involving multiple sites with  positive rheumatoid factor (HCC) - Severe rheumatoid arthritis noted on the x-rays in the past.  Patient has no synovitis on the examination today.  She states she has been doing well on hydroxychloroquine 1 tablet daily.  She denies having a flare of rheumatoid arthritis.  High risk medication use - Plaquenil 200  mg 1 tablet by mouth daily. PLQ Eye Exam 10/29/2022 -Labs from February 20, 2023 CBC and CMP were stable with creatinine elevated at 1.32.  Plan: CBC with Differential/Platelet, COMPLETE METABOLIC PANEL WITH GFR today.  She was advised to get labs every 5 months.  Information on immunization was placed in the AVS.  Displaced supracondylar fracture without intracondylar extension of lower end of right femur, subsequent encounter for closed fracture with routine healing - Followed by Dr. Jena Gauss.  History of total right hip replacement - Followed by Dr. Jena Gauss after right femur fracture in June 2021.  S/P total knee replacement, right-she had good range of motion of her knee joint without any discomfort.  Status post revision of total knee, left-she had good range of motion without discomfort.  Infected prosthetic knee joint, sequela  Degeneration of intervertebral disc of lumbar region without discogenic back pain or lower extremity pain-she has chronic discomfort in her lower back.  She denies any radiculopathy today.  Left foot drop-she ambulates with the help of a walker.  Age-related osteoporosis without current pathological fracture - February 08, 2021 DEXA scan showed a T score of -3.1 in the left radius.  We had a detailed discussion regarding different treatment options at the last visit.  Eventually patient like the side effect profile of Prolia.  I discussed Prolia again today.  Patient would like to hold off.  She wanted to take handout to review again.  Information regarding Prolia was placed in the AVS.  Fibromyalgia -she has generalized pain and discomfort.  She has positive  tender points.  Brain mass - December 2022 the patient experienced an episode of diplopia and was evaluated by her ophthalmologist Dr. Elmer Picker urgently.  She is closely followed by oncology.  Patient states recently the mass has been pressing on her 6 now or when she has diplopia now.  She does not want to have surgery.  Other medical problems listed as follows:  Dyslipidemia -last lipid panel was normal.  Although she had hyperlipidemia in the past.  Will recheck lipid panel today.  Plan: Lipid panel  Renal angiomyolipoma  Chronic kidney disease (CKD), active medical management without dialysis, stage 3 (moderate) (HCC)  History of anxiety  History of depression  Essential hypertension-blood pressure was elevated at 141/79.  She was advised to monitor blood pressure closely and follow-up with the PCP.  Hx of peripheral neuropathy  Hx of colonic polyps  Orders: Orders Placed This Encounter  Procedures   CBC with Differential/Platelet   COMPLETE METABOLIC PANEL WITH GFR   Lipid panel   No orders of the defined types were placed in this encounter.    Follow-Up Instructions: Return in about 5 months (around 12/21/2023) for Rheumatoid arthritis, Osteoarthritis.   Pollyann Savoy, MD  Note - This record has been created using Animal nutritionist.  Chart creation errors have been sought, but may not always  have been located. Such creation errors do not reflect on  the standard of medical care.

## 2023-07-13 ENCOUNTER — Encounter: Payer: PPO | Admitting: Family Medicine

## 2023-07-21 ENCOUNTER — Other Ambulatory Visit: Payer: Self-pay | Admitting: Family Medicine

## 2023-07-23 ENCOUNTER — Encounter: Payer: PPO | Admitting: Family Medicine

## 2023-07-23 ENCOUNTER — Encounter: Payer: Self-pay | Admitting: Rheumatology

## 2023-07-23 ENCOUNTER — Ambulatory Visit: Payer: PPO | Attending: Rheumatology | Admitting: Rheumatology

## 2023-07-23 VITALS — BP 141/79 | HR 57 | Resp 14 | Ht 67.0 in | Wt 214.0 lb

## 2023-07-23 DIAGNOSIS — E785 Hyperlipidemia, unspecified: Secondary | ICD-10-CM | POA: Diagnosis not present

## 2023-07-23 DIAGNOSIS — N183 Chronic kidney disease, stage 3 unspecified: Secondary | ICD-10-CM

## 2023-07-23 DIAGNOSIS — M0579 Rheumatoid arthritis with rheumatoid factor of multiple sites without organ or systems involvement: Secondary | ICD-10-CM

## 2023-07-23 DIAGNOSIS — M810 Age-related osteoporosis without current pathological fracture: Secondary | ICD-10-CM | POA: Diagnosis not present

## 2023-07-23 DIAGNOSIS — M51369 Other intervertebral disc degeneration, lumbar region without mention of lumbar back pain or lower extremity pain: Secondary | ICD-10-CM

## 2023-07-23 DIAGNOSIS — M797 Fibromyalgia: Secondary | ICD-10-CM

## 2023-07-23 DIAGNOSIS — Z96659 Presence of unspecified artificial knee joint: Secondary | ICD-10-CM

## 2023-07-23 DIAGNOSIS — T8459XS Infection and inflammatory reaction due to other internal joint prosthesis, sequela: Secondary | ICD-10-CM

## 2023-07-23 DIAGNOSIS — S72451D Displaced supracondylar fracture without intracondylar extension of lower end of right femur, subsequent encounter for closed fracture with routine healing: Secondary | ICD-10-CM | POA: Diagnosis not present

## 2023-07-23 DIAGNOSIS — Z8659 Personal history of other mental and behavioral disorders: Secondary | ICD-10-CM

## 2023-07-23 DIAGNOSIS — Z79899 Other long term (current) drug therapy: Secondary | ICD-10-CM

## 2023-07-23 DIAGNOSIS — G9389 Other specified disorders of brain: Secondary | ICD-10-CM

## 2023-07-23 DIAGNOSIS — Z860100 Personal history of colon polyps, unspecified: Secondary | ICD-10-CM

## 2023-07-23 DIAGNOSIS — I10 Essential (primary) hypertension: Secondary | ICD-10-CM

## 2023-07-23 DIAGNOSIS — Z96651 Presence of right artificial knee joint: Secondary | ICD-10-CM

## 2023-07-23 DIAGNOSIS — Z96641 Presence of right artificial hip joint: Secondary | ICD-10-CM | POA: Diagnosis not present

## 2023-07-23 DIAGNOSIS — Z96652 Presence of left artificial knee joint: Secondary | ICD-10-CM | POA: Diagnosis not present

## 2023-07-23 DIAGNOSIS — D1771 Benign lipomatous neoplasm of kidney: Secondary | ICD-10-CM

## 2023-07-23 DIAGNOSIS — M21372 Foot drop, left foot: Secondary | ICD-10-CM

## 2023-07-23 DIAGNOSIS — Z8669 Personal history of other diseases of the nervous system and sense organs: Secondary | ICD-10-CM

## 2023-07-23 NOTE — Patient Instructions (Signed)

## 2023-07-24 LAB — CBC WITH DIFFERENTIAL/PLATELET
Absolute Lymphocytes: 1895 {cells}/uL (ref 850–3900)
Absolute Monocytes: 680 {cells}/uL (ref 200–950)
Basophils Absolute: 57 {cells}/uL (ref 0–200)
Basophils Relative: 0.7 %
Eosinophils Absolute: 348 {cells}/uL (ref 15–500)
Eosinophils Relative: 4.3 %
HCT: 44.7 % (ref 35.0–45.0)
Hemoglobin: 14.4 g/dL (ref 11.7–15.5)
MCH: 29.6 pg (ref 27.0–33.0)
MCHC: 32.2 g/dL (ref 32.0–36.0)
MCV: 91.8 fL (ref 80.0–100.0)
MPV: 13.2 fL — ABNORMAL HIGH (ref 7.5–12.5)
Monocytes Relative: 8.4 %
Neutro Abs: 5119 {cells}/uL (ref 1500–7800)
Neutrophils Relative %: 63.2 %
Platelets: 162 10*3/uL (ref 140–400)
RBC: 4.87 10*6/uL (ref 3.80–5.10)
RDW: 12.2 % (ref 11.0–15.0)
Total Lymphocyte: 23.4 %
WBC: 8.1 10*3/uL (ref 3.8–10.8)

## 2023-07-24 LAB — COMPLETE METABOLIC PANEL WITH GFR
AG Ratio: 2.1 (calc) (ref 1.0–2.5)
ALT: 11 U/L (ref 6–29)
AST: 20 U/L (ref 10–35)
Albumin: 4.4 g/dL (ref 3.6–5.1)
Alkaline phosphatase (APISO): 75 U/L (ref 37–153)
BUN/Creatinine Ratio: 24 (calc) — ABNORMAL HIGH (ref 6–22)
BUN: 36 mg/dL — ABNORMAL HIGH (ref 7–25)
CO2: 32 mmol/L (ref 20–32)
Calcium: 9.8 mg/dL (ref 8.6–10.4)
Chloride: 103 mmol/L (ref 98–110)
Creat: 1.49 mg/dL — ABNORMAL HIGH (ref 0.60–0.95)
Globulin: 2.1 g/dL (ref 1.9–3.7)
Glucose, Bld: 75 mg/dL (ref 65–99)
Potassium: 5.1 mmol/L (ref 3.5–5.3)
Sodium: 142 mmol/L (ref 135–146)
Total Bilirubin: 1.3 mg/dL — ABNORMAL HIGH (ref 0.2–1.2)
Total Protein: 6.5 g/dL (ref 6.1–8.1)
eGFR: 35 mL/min/{1.73_m2} — ABNORMAL LOW (ref >=60)

## 2023-07-24 LAB — LIPID PANEL
Cholesterol: 148 mg/dL (ref <200)
HDL: 51 mg/dL (ref >=50)
LDL Cholesterol (Calc): 71 mg/dL
Non-HDL Cholesterol (Calc): 97 mg/dL (ref <130)
Total CHOL/HDL Ratio: 2.9 (calc) (ref <5.0)
Triglycerides: 190 mg/dL — ABNORMAL HIGH (ref <150)

## 2023-07-24 NOTE — Progress Notes (Signed)
CBC normal.  Creatinine is elevated at 1.49, most likely due to diuretic use.  Lipid panel showed elevated triglycerides.  Please forward results to her PCP.

## 2023-08-05 ENCOUNTER — Ambulatory Visit: Payer: PPO | Admitting: Family Medicine

## 2023-08-05 ENCOUNTER — Encounter: Payer: Self-pay | Admitting: Family Medicine

## 2023-08-05 VITALS — BP 145/66 | HR 59 | Temp 98.1°F | Ht 67.0 in | Wt 220.6 lb

## 2023-08-05 DIAGNOSIS — E785 Hyperlipidemia, unspecified: Secondary | ICD-10-CM

## 2023-08-05 DIAGNOSIS — D631 Anemia in chronic kidney disease: Secondary | ICD-10-CM

## 2023-08-05 DIAGNOSIS — G6289 Other specified polyneuropathies: Secondary | ICD-10-CM | POA: Diagnosis not present

## 2023-08-05 DIAGNOSIS — Z0000 Encounter for general adult medical examination without abnormal findings: Secondary | ICD-10-CM

## 2023-08-05 DIAGNOSIS — R600 Localized edema: Secondary | ICD-10-CM | POA: Diagnosis not present

## 2023-08-05 DIAGNOSIS — N189 Chronic kidney disease, unspecified: Secondary | ICD-10-CM | POA: Diagnosis not present

## 2023-08-05 DIAGNOSIS — D332 Benign neoplasm of brain, unspecified: Secondary | ICD-10-CM

## 2023-08-05 DIAGNOSIS — N183 Chronic kidney disease, stage 3 unspecified: Secondary | ICD-10-CM

## 2023-08-05 DIAGNOSIS — I10 Essential (primary) hypertension: Secondary | ICD-10-CM | POA: Diagnosis not present

## 2023-08-05 DIAGNOSIS — M0579 Rheumatoid arthritis with rheumatoid factor of multiple sites without organ or systems involvement: Secondary | ICD-10-CM | POA: Diagnosis not present

## 2023-08-05 DIAGNOSIS — Z0001 Encounter for general adult medical examination with abnormal findings: Secondary | ICD-10-CM

## 2023-08-05 DIAGNOSIS — M810 Age-related osteoporosis without current pathological fracture: Secondary | ICD-10-CM | POA: Diagnosis not present

## 2023-08-05 MED ORDER — FUROSEMIDE 20 MG PO TABS: 20.0000 mg | ORAL_TABLET | Freq: Every day | ORAL | 3 refills | Status: AC | PRN

## 2023-08-05 MED ORDER — AMLODIPINE BESYLATE 2.5 MG PO TABS: 2.5000 mg | ORAL_TABLET | Freq: Every day | ORAL | 3 refills | Status: DC

## 2023-08-05 NOTE — Progress Notes (Unsigned)
Subjective  Chief Complaint  Patient presents with   Annual Exam    Pt here for Annual Exam and is currently fasting    Hypertension    HPI: Laura Chambers is a 83 y.o. female who presents to [facility] [place] today for a Female Wellness Visit. She also has the concerns and/or needs as listed above in the chief complaint. These will be addressed in addition to the Health Maintenance Visit.   Wellness Visit: annual visit with health maintenance review and exam  HM: discussed screening mammograms: pt has scheduled. Discussed when to stop screens. Still getting colonoscopy surveillance due to history. Defers dexa due to limited application (only wrists due to h/o surgical interventions in hip and lumbar spine). Pt declines f/u for this right now. Dr. Corliss Skains following as well.  Chronic disease f/u and/or acute problem visit: (deemed necessary to be done in addition to the wellness visit): HTN: BP abvoe target on atenolol 50 bid and hydrochlorothiazide. Also takes lasix intermittently for LE edema. Reviewed recent renal panel with elevating Creat and lower GFR with elevated BUN. No cp Lipids are at goal.  RA - reviewed recent rheum visit. Stable on HR meds. See lab results below.  CBC is stable Feeling well overall. Having double vision again and monitoring w/ NS due to brain lesion.   Assessment  1. Encounter for well adult exam with abnormal findings   2. Benign neoplasm of brain, unspecified brain region (HCC)   3. Chronic kidney disease (CKD), active medical management without dialysis, stage 3 (moderate)   4. Dyslipidemia   5. Essential hypertension   6. Osteoporosis, post-menopausal   7. Other polyneuropathy   8. Rheumatoid arthritis involving multiple sites with positive rheumatoid factor (HCC)   9. Bilateral lower extremity edema   10. Anemia associated with chronic renal failure      Plan  Female Wellness Visit: Age appropriate Health Maintenance  and Prevention measures were discussed with patient. Included topics are cancer screening recommendations, ways to keep healthy (see AVS) including dietary and exercise recommendations, regular eye and dental care, use of seat belts, and avoidance of moderate alcohol use and tobacco use. Discussed screens. BMI: discussed patient's BMI and encouraged positive lifestyle modifications to help get to or maintain a target BMI. HM needs and immunizations were addressed and ordered. See below for orders. See HM and immunization section for updates. Routine labs and screening tests ordered including cmp, cbc and lipids where appropriate. Discussed recommendations regarding Vit D and calcium supplementation (see AVS)  Chronic disease management visit and/or acute problem visit: HTN: stop hydrochlorothiazide. Add amlodipine 2.5mg  daily to atenolol.  CKD: monitor renal function.  Edema: lasix 3-7days per week. No potassium supplement needed. Will monitor.  No other changes in medications made today.  Follow up: 6 mo to recheck BP and renal function.  No orders of the defined types were placed in this encounter.  Meds ordered this encounter  Medications   furosemide (LASIX) 20 MG tablet    Sig: Take 1 tablet (20 mg total) by mouth daily as needed for edema.    Dispense:  90 tablet    Refill:  3    Please inform patient that she does NOT need to take potassium as her levels are borderline high.   amLODipine (NORVASC) 2.5 MG tablet    Sig: Take 1 tablet (2.5 mg total) by mouth daily.    Dispense:  90 tablet    Refill:  3      Body mass index is 34.55 kg/m. Wt Readings from Last 3 Encounters:  08/05/23 220 lb 9.6 oz (100.1 kg)  07/23/23 214 lb (97.1 kg)  02/20/23 208 lb 12.8 oz (94.7 kg)     Patient Active Problem List   Diagnosis Date Noted Date Diagnosed   Benign neoplasm of brain (HCC) 09/17/2022     Priority: High   Osteoporosis, post-menopausal 06/24/2021     Priority: High    DEXA  02/2021: lowest T = -3.1 left radius; spine and hip -1.4 and -1.0; recheck 2 years.     Rheumatoid arthritis involving multiple sites with positive rheumatoid factor (HCC) 10/13/2018     Priority: High   Chronic kidney disease (CKD), active medical management without dialysis, stage 3 (moderate) 12/15/2017     Priority: High    6/29-01/11/2020 AKI superimposed on CKD due to hypotension and hypovolemia in the context of surgical repair of periprosthetic knee fracture Creatinine up to 2.03 and GFR down to 23 Creatinine 1.17/GFR 44 at discharge 11/2022: creat 1.7/GFR 37    Fibromyalgia 12/15/2017     Priority: High   Reactive depression (situational) 08/17/2017     Priority: High   Essential hypertension 12/08/2016     Priority: High   Peripheral neuropathy 01/12/2014     Priority: High   Dyslipidemia 06/16/2012     Priority: High   Anemia associated with chronic renal failure 05/16/2020     Priority: Medium    History of femur fracture supracondylar 2021, right 01/03/2020     Priority: Medium    Hx of colonic polyps 12/15/2017     Priority: Medium    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 Dutch Quint 2012    Chronic constipation 01/12/2020     Priority: Low   History of total right hip replacement 12/15/2017     Priority: Low    2014 Dr. Charlann Boxer    S/P total knee replacement, right 12/15/2017     Priority: Low    2010 Dr. Despina Hick    Left foot drop 12/15/2017     Priority: Low   Status post revision of total knee, left 06/09/2017     Priority: Low   Urine retention 12/02/2016     Priority: Low   Bilateral lower extremity edema 11/05/2022    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      6/29-01/11/2020 right periprosthetic knee fracture sustained in a mechanical fall in the context of foot drop and arch dysfunction. 6/30 open reduction  internal fixation of the right distal femoral fracture by Dr. Jena Gauss. Lovenox DVT prophylaxis postop.    Trigeminal neuralgia of right side of face 08/19/2015    Health Maintenance  Topic Date Due   COVID-19 Vaccine (4 - 2024-25 season) 08/21/2023 (Originally 03/08/2023)   Zoster Vaccines- Shingrix (1 of 2) 11/03/2023 (Originally 05/19/1960)   DEXA SCAN  08/04/2024 (Originally 02/09/2023)   Medicare Annual Wellness (AWV)  10/20/2023   Colonoscopy  04/03/2026   Pneumonia Vaccine 51+ Years old  Completed   INFLUENZA VACCINE  Completed   HPV VACCINES  Aged Out   DTaP/Tdap/Td  Discontinued   Immunization History  Administered Date(s) Administered   Fluad Quad(high Dose 65+) 04/26/2019, 05/08/2020, 04/02/2022   Influenza, High Dose Seasonal PF 03/27/2018, 05/18/2023   Influenza,inj,Quad PF,6+ Mos 03/13/2017  Influenza-Unspecified 03/26/2018, 06/04/2020, 07/18/2021   PFIZER(Purple Top)SARS-COV-2 Vaccination 09/02/2019, 09/30/2019, 05/08/2020   Pneumococcal Conjugate-13 02/26/2018   Pneumococcal Polysaccharide-23 05/17/2019   We updated and reviewed the patient's past history in detail and it is documented below. Allergies: Patient is allergic to ativan [lorazepam], haldol [haloperidol], epinephrine, levofloxacin, lipitor [atorvastatin], losartan, robaxin [methocarbamol], and trovan [alatrofloxacin]. Past Medical History Patient  has a past medical history of Anemia, Anxiety, Arthritis, Atelectasis, left, Blood transfusion without reported diagnosis, Cataract cortical, senile, bilateral (03/19/2018), Complication of anesthesia (2012), Depression, Esophageal hernia, Fibromyalgia, Headache, HTN (hypertension), colonic polyps, Hydronephrosis of right kidney, Hypercholesterolemia, Hypertensive retinopathy of both eyes (03/19/2018), Neuropathy due to medical condition (HCC), Obesity, Osteoarthritis (arthritis due to wear and tear of joints), Osteoporosis, Renal angiomyolipoma, and Rheumatoid  arthritis (HCC). Past Surgical History Patient  has a past surgical history that includes Total knee arthroplasty (2001); Cholecystectomy (2010); Tonsillectomy (1946); Abdominal hysterectomy (1969); Paraesophageal hernia repair (1989); Breast biopsy (1989); Nissen fundoplication; Total knee arthroplasty (2010); blephoroplasty; Back surgery (2012); Total hip arthroplasty (Right, 08/31/2012); Excisional total knee arthroplasty with antibiotic spacers (Left, 11/14/2016); Revision total knee arthroplasty (Left, 06/09/2017); Total knee revision (Left, 06/09/2017); Joint replacement; ORIF femur fracture (Right, 01/04/2020); Cataract extraction, bilateral; Cataract extraction (Bilateral); and Colonoscopy (2022). Family History: Patient family history includes Colon cancer (age of onset: 82) in her sister; Colon polyps in an other family member; Diabetes in her brother, father, and sister; Heart disease in her father; Osteoarthritis in her father and mother; Pneumonia in her mother; Stroke in her father and mother. Social History:  Patient  reports that she has never smoked. She has been exposed to tobacco smoke. She has never used smokeless tobacco. She reports that she does not drink alcohol and does not use drugs.  Review of Systems: Constitutional: negative for fever or malaise Ophthalmic: negative for photophobia, double vision or loss of vision Cardiovascular: negative for chest pain, dyspnea on exertion, or new LE swelling Respiratory: negative for SOB or persistent cough Gastrointestinal: negative for abdominal pain, change in bowel habits or melena Genitourinary: negative for dysuria or gross hematuria, no abnormal uterine bleeding or disharge Musculoskeletal: negative for new gait disturbance or muscular weakness Integumentary: negative for new or persistent rashes, no breast lumps Neurological: negative for TIA or stroke symptoms Psychiatric: negative for SI or delusions Allergic/Immunologic:  negative for hives  Patient Care Team    Relationship Specialty Notifications Start End  Willow Ora, MD PCP - General Family Medicine  05/11/19   Durene Romans, MD Consulting Physician Orthopedic Surgery  04/03/17   Dimitri Ped, MD Consulting Physician Ophthalmology  04/01/18   Pollyann Savoy, MD Consulting Physician Rheumatology  04/08/18   Diona Foley, MD Consulting Physician Ophthalmology  06/26/21   Angelene Giovanni, MD Referring Physician Neurosurgery  08/06/21   Anson Fret, MD Consulting Physician Neurology  11/11/21     Objective  Vitals: BP (!) 145/66   Pulse (!) 59   Temp 98.1 F (36.7 C)   Ht 5\' 7"  (1.702 m)   Wt 220 lb 9.6 oz (100.1 kg)   SpO2 97%   BMI 34.55 kg/m  General:  Well developed, well nourished, no acute distress  Psych:  Alert and orientedx3,normal mood and affect HEENT:  Normocephalic, atraumatic, non-icteric sclera,  supple neck without adenopathy, mass or thyromegaly Cardiovascular:  Normal S1, S2, RRR without gallop, rub or murmur Respiratory:  Good breath sounds bilaterally, CTAB with normal respiratory effort Gastrointestinal: normal bowel sounds, soft, non-tender, no noted masses.  No HSM MSK: extremities with 2 + pitting edema, joints without erythema or swelling Neurologic:    Mental status is normal.    No visits with results within 1 Day(s) from this visit.  Latest known visit with results is:  Office Visit on 07/23/2023  Component Date Value Ref Range Status   WBC 07/23/2023 8.1  3.8 - 10.8 Thousand/uL Final   RBC 07/23/2023 4.87  3.80 - 5.10 Million/uL Final   Hemoglobin 07/23/2023 14.4  11.7 - 15.5 g/dL Final   HCT 16/04/9603 44.7  35.0 - 45.0 % Final   MCV 07/23/2023 91.8  80.0 - 100.0 fL Final   MCH 07/23/2023 29.6  27.0 - 33.0 pg Final   MCHC 07/23/2023 32.2  32.0 - 36.0 g/dL Final   RDW 54/03/8118 12.2  11.0 - 15.0 % Final   Platelets 07/23/2023 162  140 - 400 Thousand/uL Final   MPV 07/23/2023 13.2 (H)  7.5 - 12.5  fL Final   Neutro Abs 07/23/2023 5,119  1,500 - 7,800 cells/uL Final   Absolute Lymphocytes 07/23/2023 1,895  850 - 3,900 cells/uL Final   Absolute Monocytes 07/23/2023 680  200 - 950 cells/uL Final   Eosinophils Absolute 07/23/2023 348  15 - 500 cells/uL Final   Basophils Absolute 07/23/2023 57  0 - 200 cells/uL Final   Neutrophils Relative % 07/23/2023 63.2  % Final   Total Lymphocyte 07/23/2023 23.4  % Final   Monocytes Relative 07/23/2023 8.4  % Final   Eosinophils Relative 07/23/2023 4.3  % Final   Basophils Relative 07/23/2023 0.7  % Final   Glucose, Bld 07/23/2023 75  65 - 99 mg/dL Final   BUN 14/78/2956 36 (H)  7 - 25 mg/dL Final   Creat 21/30/8657 1.49 (H)  0.60 - 0.95 mg/dL Final   eGFR 84/69/6295 35 (L)  > OR = 60 mL/min/1.62m2 Final   BUN/Creatinine Ratio 07/23/2023 24 (H)  6 - 22 (calc) Final   Sodium 07/23/2023 142  135 - 146 mmol/L Final   Potassium 07/23/2023 5.1  3.5 - 5.3 mmol/L Final   Chloride 07/23/2023 103  98 - 110 mmol/L Final   CO2 07/23/2023 32  20 - 32 mmol/L Final   Calcium 07/23/2023 9.8  8.6 - 10.4 mg/dL Final   Total Protein 28/41/3244 6.5  6.1 - 8.1 g/dL Final   Albumin 07/09/7251 4.4  3.6 - 5.1 g/dL Final   Globulin 66/44/0347 2.1  1.9 - 3.7 g/dL (calc) Final   AG Ratio 07/23/2023 2.1  1.0 - 2.5 (calc) Final   Total Bilirubin 07/23/2023 1.3 (H)  0.2 - 1.2 mg/dL Final   Alkaline phosphatase (APISO) 07/23/2023 75  37 - 153 U/L Final   AST 07/23/2023 20  10 - 35 U/L Final   ALT 07/23/2023 11  6 - 29 U/L Final   Cholesterol 07/23/2023 148  <200 mg/dL Final   HDL 42/59/5638 51  > OR = 50 mg/dL Final   Triglycerides 75/64/3329 190 (H)  <150 mg/dL Final   LDL Cholesterol (Calc) 07/23/2023 71  mg/dL (calc) Final   Total CHOL/HDL Ratio 07/23/2023 2.9  <5.1 (calc) Final   Non-HDL Cholesterol (Calc) 07/23/2023 97  <130 mg/dL (calc) Final    Commons side effects, risks, benefits, and alternatives for medications and treatment plan prescribed today were  discussed, and the patient expressed understanding of the given instructions. Patient is instructed to call or message via MyChart if he/she has any questions or concerns regarding our treatment plan. No barriers  to understanding were identified. We discussed Red Flag symptoms and signs in detail. Patient expressed understanding regarding what to do in case of urgent or emergency type symptoms.  Medication list was reconciled, printed and provided to the patient in AVS. Patient instructions and summary information was reviewed with the patient as documented in the AVS. This note was prepared with assistance of Dragon voice recognition software. Occasional wrong-word or sound-a-like substitutions may have occurred due to the inherent limitations of voice recognition software

## 2023-08-05 NOTE — Patient Instructions (Signed)
Please return in 6 months for hypertension follow up and kidney recheck.   If you have any questions or concerns, please don't hesitate to send me a message via MyChart or call the office at [phone]. Thank you for visiting with [place] today! It's our pleasure caring for you.

## 2023-08-07 ENCOUNTER — Telehealth: Payer: Self-pay

## 2023-08-07 NOTE — Telephone Encounter (Signed)
Copied from CRM [id number]. Topic: Clinical - Prescription Issue >> Aug 07, 2023 10:19 AM Kathryne Eriksson wrote: Patient states she was suppose to receive a prescription over at the pharmacy for potassium, the pharmacy claims they never received it and patient is wanting to know what's going on as far as the medication.

## 2023-08-07 NOTE — Telephone Encounter (Signed)
Copied from CRM [id number]. Topic: Clinical - Prescription Issue >> Aug 07, 2023 10:17 AM Hector Shade B wrote: Reason for CRM: gabapentin (NEURONTIN) 300 MG capsule, patient stated she was prescribed the 300 mg to be take 2xs a day but she normally takes 150 2xs a day she wanted to notify the provider the she still taking but only 1x a day, she also stated that her potassium medication has to be in prescription form so that her insurance can pay for it.  Patient states if theres any questions please call her at [phone]

## 2023-08-12 NOTE — Telephone Encounter (Signed)
 Managed in different telephone note; does not need potassium supplements.

## 2023-08-31 ENCOUNTER — Other Ambulatory Visit: Payer: Self-pay | Admitting: Physician Assistant

## 2023-09-01 NOTE — Telephone Encounter (Signed)
 Last Fill: 04/23/2023  Eye exam: 10/29/2022 WNL    Labs: 07/23/2023 CBC normal.  Creatinine is elevated at 1.49, most likely due to diuretic use.  Lipid panel showed elevated triglycerides.   Next Visit: 12/31/2023  Last Visit: 07/23/2023  QI:[id number] arthritis involving multiple sites with positive rheumatoid factor   Current Dose per office note 07/23/2023: Plaquenil 200 mg 1 tablet by mouth daily.   Okay to refill Plaquenil?

## 2023-10-10 DIAGNOSIS — S81811A Laceration without foreign body, right lower leg, initial encounter: Secondary | ICD-10-CM | POA: Diagnosis not present

## 2023-10-10 DIAGNOSIS — W1839XA Other fall on same level, initial encounter: Secondary | ICD-10-CM | POA: Diagnosis not present

## 2023-10-10 DIAGNOSIS — Z888 Allergy status to other drugs, medicaments and biological substances status: Secondary | ICD-10-CM | POA: Diagnosis not present

## 2023-10-10 DIAGNOSIS — Z6830 Body mass index (BMI) 30.0-30.9, adult: Secondary | ICD-10-CM | POA: Diagnosis not present

## 2023-10-10 DIAGNOSIS — Z23 Encounter for immunization: Secondary | ICD-10-CM | POA: Diagnosis not present

## 2023-10-10 DIAGNOSIS — Z881 Allergy status to other antibiotic agents status: Secondary | ICD-10-CM | POA: Diagnosis not present

## 2023-10-10 DIAGNOSIS — E669 Obesity, unspecified: Secondary | ICD-10-CM | POA: Diagnosis not present

## 2023-10-10 DIAGNOSIS — Z7982 Long term (current) use of aspirin: Secondary | ICD-10-CM | POA: Diagnosis not present

## 2023-10-13 ENCOUNTER — Encounter: Payer: Self-pay | Admitting: Family Medicine

## 2023-10-13 ENCOUNTER — Ambulatory Visit (INDEPENDENT_AMBULATORY_CARE_PROVIDER_SITE_OTHER): Admitting: Family Medicine

## 2023-10-13 VITALS — BP 159/73 | HR 60 | Temp 98.1°F | Ht 67.0 in | Wt 214.8 lb

## 2023-10-13 DIAGNOSIS — I10 Essential (primary) hypertension: Secondary | ICD-10-CM | POA: Diagnosis not present

## 2023-10-13 DIAGNOSIS — W19XXXD Unspecified fall, subsequent encounter: Secondary | ICD-10-CM | POA: Diagnosis not present

## 2023-10-13 DIAGNOSIS — S81811D Laceration without foreign body, right lower leg, subsequent encounter: Secondary | ICD-10-CM

## 2023-10-13 DIAGNOSIS — G6289 Other specified polyneuropathies: Secondary | ICD-10-CM | POA: Diagnosis not present

## 2023-10-13 LAB — CBC WITH DIFFERENTIAL/PLATELET
Basophils Absolute: 0.1 10*3/uL (ref 0.0–0.1)
Basophils Relative: 0.9 % (ref 0.0–3.0)
Eosinophils Absolute: 0.2 10*3/uL (ref 0.0–0.7)
Eosinophils Relative: 3.6 % (ref 0.0–5.0)
HCT: 39.2 % (ref 36.0–46.0)
Hemoglobin: 12.9 g/dL (ref 12.0–15.0)
Lymphocytes Relative: 17.7 % (ref 12.0–46.0)
Lymphs Abs: 1.2 10*3/uL (ref 0.7–4.0)
MCHC: 32.9 g/dL (ref 30.0–36.0)
MCV: 92.1 fl (ref 78.0–100.0)
Monocytes Absolute: 0.5 10*3/uL (ref 0.1–1.0)
Monocytes Relative: 7.5 % (ref 3.0–12.0)
Neutro Abs: 4.7 10*3/uL (ref 1.4–7.7)
Neutrophils Relative %: 70.3 % (ref 43.0–77.0)
Platelets: 163 10*3/uL (ref 150.0–400.0)
RBC: 4.26 Mil/uL (ref 3.87–5.11)
RDW: 13.1 % (ref 11.5–15.5)
WBC: 6.6 10*3/uL (ref 4.0–10.5)

## 2023-10-13 NOTE — Progress Notes (Signed)
 Subjective  CC:  Chief Complaint  Patient presents with   Hospitalization Follow-up    [facility]  *cut her leg on her walker (had to get sticthes inside and out) was placed on meds *started last night      HPI: Laura Chambers is a 83 y.o. female who presents to the office today to address the problems listed above in the chief complaint. Discussed the use of AI scribe software for clinical note transcription with the patient, who gave verbal consent to proceed.  History of Present Illness   The patient, with neuropathy, presents with a foot injury and blood loss after a fall. She recently traveled to [place] with her girlfriend.  She recently traveled to [place] and used a different walker with wheels. On Friday night, she got up to go to the bathroom and accidentally placed her right foot between the wheel and the bar of the walker due to her neuropathy, resulting in a fall. She bruised her toes, particularly the one next to the big toe, and experienced significant blood loss. Initially, there was a concern for needing a blood transfusion, but no blood work was done. She has been keeping the wound bandaged and finds it difficult to elevate her legs above her heart. No cp, palpitations or lightheadedness. She has been using Keflex for the wound and experiences nausea with its use, which she manages by eating peanut butter and crackers before taking the medication. Her toes are bruised, and her leg was purple when first unwrapped. She also reports swelling and redness, which she attributes to her pre-existing edema. No significant drainage from wound. No fevers. No further falls.  She has neuropathy, which contributed to her fall, and she experiences swelling in her legs. She needs to walk to maintain her independence.  She lives alone and needs to maintain mobility for independence.     Bp is elevated in office today; has been well controlled.    Assessment  1.  Laceration of right lower extremity, subsequent encounter   2. Fall, subsequent encounter   3. Other polyneuropathy   4. Essential hypertension      Plan  Assessment and Plan    Leg laceration and fall Wound is clean and looks good. 10 interrupted sutures in place. On keflex. Monitoring for infection. Discussed moisture balance to aid healing. Possible antibiotic change if infection worsens. - Order blood count to assess hemoglobin levels. - Advise use of Vaseline on toes for moisture. - Recommend leg elevation to reduce swelling. - Instruct to keep wound covered with gauze, allow air exposure at home. - Advise monitoring for increased redness or drainage, report if occurs. - Consider doxycycline if infection signs worsen. - Advise over-the-counter hydrocortisone for inflammation on skin.  Edema Leg swelling exacerbated by injury. Elevation recommended. - Advise elevating legs above heart level. - Encourage continued mobility.  Peripheral neuropathy Neuropathy affects mobility and sensation, contributing to injury. - Encourage continued mobility. - Advise monitoring for changes in symptoms.        Orders Placed This Encounter  Procedures   CBC with Differential/Platelet   Iron, TIBC and Ferritin Panel   No orders of the defined types were placed in this encounter.    I reviewed the patients updated PMH, FH, and SocHx.    Patient Active Problem List   Diagnosis Date Noted   Benign neoplasm of brain (HCC) 09/17/2022    Priority: High   Osteoporosis, post-menopausal 06/24/2021    Priority:  High   Rheumatoid arthritis involving multiple sites with positive rheumatoid factor (HCC) 10/13/2018    Priority: High   Chronic kidney disease (CKD), active medical management without dialysis, stage 3 (moderate) 12/15/2017    Priority: High   Fibromyalgia 12/15/2017    Priority: High   Reactive depression (situational) 08/17/2017    Priority: High   Essential hypertension  12/08/2016    Priority: High   Peripheral neuropathy 01/12/2014    Priority: High   Dyslipidemia 06/16/2012    Priority: High   Anemia associated with chronic renal failure 05/16/2020    Priority: Medium    History of femur fracture supracondylar 2021, right 01/03/2020    Priority: Medium    Hx of colonic polyps 12/15/2017    Priority: Medium    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    Priority: Medium    DDD (degenerative disc disease), lumbar 12/15/2017    Priority: Medium    Chronic constipation 01/12/2020    Priority: Low   History of total right hip replacement 12/15/2017    Priority: Low   S/P total knee replacement, right 12/15/2017    Priority: Low   Left foot drop 12/15/2017    Priority: Low   Status post revision of total knee, left 06/09/2017    Priority: Low   Urine retention 12/02/2016    Priority: Low   Bilateral lower extremity edema 11/05/2022   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    Trigeminal neuralgia of right side of face 08/19/2015   Current Meds  Medication Sig   amLODipine (NORVASC) 2.5 MG tablet Take 1 tablet (2.5 mg total) by mouth daily.   atenolol (TENORMIN) 50 MG tablet Take 1 tablet (50 mg total) by mouth 2 (two) times daily.   Calcium Carbonate-Vitamin D (CALCIUM 600+D) 600-400 MG-UNIT tablet Take 1 tablet by mouth every other day.   FLUoxetine (PROZAC) 20 MG capsule TAKE 1 CAPSULE BY MOUTH EVERY DAY   folic acid (FOLVITE) 800 MCG tablet Take 800 mcg daily by mouth.    furosemide (LASIX) 20 MG tablet Take 1 tablet (20 mg total) by mouth daily as needed for edema.   gabapentin (NEURONTIN) 300 MG capsule TAKE 1 CAPSULE BY MOUTH TWICE A DAY   Ginger, Zingiber officinalis, (GINGER ROOT PO) Take by mouth.   hydroxychloroquine (PLAQUENIL) 200 MG tablet TAKE 1 TABLET BY MOUTH EVERY DAY   Multiple Vitamin (MULTIVITAMIN WITH MINERALS) TABS tablet Take 1 tablet by mouth daily.    Omega-3 1000 MG CAPS Take 1 capsule by mouth  daily.    rosuvastatin (CRESTOR) 10 MG tablet TAKE 1 TABLET BY MOUTH EVERY DAY   TART CHERRY PO Take by mouth.   Turmeric (QC TUMERIC COMPLEX PO) Take by mouth.    Allergies: Patient is allergic to ativan [lorazepam], haldol [haloperidol], epinephrine, levofloxacin, lipitor [atorvastatin], losartan, robaxin [methocarbamol], and trovan [alatrofloxacin]. Family History: Patient family history includes Colon cancer (age of onset: 33) in her sister; Colon polyps in an other family member; Diabetes in her brother, father, and sister; Heart disease in her father; Osteoarthritis in her father and mother; Pneumonia in her mother; Stroke in her father and mother. Social History:  Patient  reports that she has never smoked. She has been exposed to tobacco smoke. She has never used smokeless tobacco. She reports that she does not drink alcohol and does not use drugs.  Review of Systems: Constitutional: Negative for fever malaise or anorexia Cardiovascular: negative for chest pain Respiratory: negative for SOB or  persistent cough Gastrointestinal: negative for abdominal pain  Objective  Vitals: BP (!) 159/73   Pulse 60   Temp 98.1 F (36.7 C)   Ht 5\' 7"  (1.702 m)   Wt 214 lb 12.8 oz (97.4 kg)   SpO2 97%   BMI 33.64 kg/m  General: no acute distress , A&Ox3 Right shin: approx 4 inch laceration w/ 10 interrupted sutures in place. Dry. Mild surrounding erythema. No warmth. + 2 pitting edema. Distal pulses intact Right 3nd toe and left great toe with abrasions present.   Commons side effects, risks, benefits, and alternatives for medications and treatment plan prescribed today were discussed, and the patient expressed understanding of the given instructions. Patient is instructed to call or message via MyChart if he/she has any questions or concerns regarding our treatment plan. No barriers to understanding were identified. We discussed Red Flag symptoms and signs in detail. Patient expressed  understanding regarding what to do in case of urgent or emergency type symptoms.  Medication list was reconciled, printed and provided to the patient in AVS. Patient instructions and summary information was reviewed with the patient as documented in the AVS. This note was prepared with assistance of Dragon voice recognition software. Occasional wrong-word or sound-a-like substitutions may have occurred due to the inherent limitations of voice recognition software

## 2023-10-13 NOTE — Patient Instructions (Addendum)
 Please return for suture removal 4/22. Come back sooner if needed for recheck.   I will release your lab results to you on your MyChart account with further instructions. You may see the results before I do, but when I review them I will send you a message with my report or have my assistant call you if things need to be discussed. Please reply to my message with any questions. Thank you!   If you have any questions or concerns, please don't hesitate to send me a message via MyChart or call the office at [phone]. Thank you for visiting with [place] today! It's our pleasure caring for you.

## 2023-10-14 LAB — IRON,TIBC AND FERRITIN PANEL
%SAT: 20 % (ref 16–45)
Ferritin: 214 ng/mL (ref 16–288)
Iron: 58 ug/dL (ref 45–160)
TIBC: 289 ug/dL (ref 250–450)

## 2023-10-15 ENCOUNTER — Encounter: Payer: Self-pay | Admitting: Family Medicine

## 2023-10-15 NOTE — Progress Notes (Signed)
 See mychart note Dear Ms. Haun, Your blood counts look ok. They are a little lower than before but still within normal range. It would be a good idea to take an OTC iron supplement once a day for several weeks to replace the iron you lost from bleeding. But nothing else is needed at this time.  Sincerely, Dr. Mardelle Matte

## 2023-10-22 ENCOUNTER — Encounter: Payer: Self-pay | Admitting: Family

## 2023-10-22 ENCOUNTER — Ambulatory Visit: Admitting: Family

## 2023-10-22 VITALS — BP 146/69 | HR 64 | Temp 98.1°F | Ht 67.0 in | Wt 212.5 lb

## 2023-10-22 DIAGNOSIS — S81811A Laceration without foreign body, right lower leg, initial encounter: Secondary | ICD-10-CM | POA: Diagnosis not present

## 2023-10-22 DIAGNOSIS — Z4802 Encounter for removal of sutures: Secondary | ICD-10-CM

## 2023-10-22 NOTE — Progress Notes (Signed)
 Patient ID: Laura Chambers, female    DOB: August 04, 1940, 83 y.o.   MRN: [id number]  Chief Complaint  Patient presents with   Suture / Staple Removal    Pt is here for a suture removal on right leg.    HPI: Leg laceration:  pt here today for stitch removal. She reports being on vacation 2w ago with her sister in [place], [place] and she fell against her walker in the bathroom and suffered a deep laceration along her anterior right lower leg which caused a large amount of bleeding requiring ER visit and receiving internal and 10 external stitches. She said it bled for a short time before she realized due to not feeling pain from the laceration as she has neuropathy. She was seen for f/u by PCP a week ago and advised by ER to have stitches removed after 2w.   Assessment & Plan: Leg laceration - Right anterior lower leg. Stitches slightly embedded, covered in dried blood/drainage, mild tissue swelling and minimal serosanguinous drainage noted. 10 stitches noted for removal. - Verbal consent obtained for stitch removal. Wound cleaned with betadine. 9 stiches removed without difficulty, 1 stitch in middle more difficult, resulting in moderate amount of bleeding, pressure held until resolution of bleeding obtained. Cleaned again with betadine and alcohol, dry gauze applied, secured with ace wrap bandage. Pt advised on keeping wound clean with soap and water  daily, apply very thin layer of bacitracin ointment (provided in office) for 2-3d, keep dry gauze applied if still draining, advised on s/s of infection and when to notify the office.  - Keep leg elevated whenever resting.  Subjective:    Outpatient Medications Prior to Visit  Medication Sig Dispense Refill   amLODipine  (NORVASC ) 2.5 MG tablet Take 1 tablet (2.5 mg total) by mouth daily. 90 tablet 3   atenolol  (TENORMIN ) 50 MG tablet Take 1 tablet (50 mg total) by mouth 2 (two) times daily. 180 tablet 3   Calcium  Carbonate-Vitamin D  (CALCIUM   600+D) 600-400 MG-UNIT tablet Take 1 tablet by mouth every other day.     FLUoxetine  (PROZAC ) 20 MG capsule TAKE 1 CAPSULE BY MOUTH EVERY DAY 90 capsule 3   folic acid  (FOLVITE ) 800 MCG tablet Take 800 mcg daily by mouth.      furosemide  (LASIX ) 20 MG tablet Take 1 tablet (20 mg total) by mouth daily as needed for edema. 90 tablet 3   gabapentin  (NEURONTIN ) 300 MG capsule TAKE 1 CAPSULE BY MOUTH TWICE A DAY 180 capsule 3   Ginger, Zingiber officinalis, (GINGER ROOT PO) Take by mouth.     hydroxychloroquine  (PLAQUENIL ) 200 MG tablet TAKE 1 TABLET BY MOUTH EVERY DAY 90 tablet 0   Multiple Vitamin (MULTIVITAMIN WITH MINERALS) TABS tablet Take 1 tablet by mouth daily.      Omega-3 1000 MG CAPS Take 1 capsule by mouth daily.      rosuvastatin  (CRESTOR ) 10 MG tablet TAKE 1 TABLET BY MOUTH EVERY DAY 90 tablet 3   TART CHERRY PO Take by mouth.     Turmeric (QC TUMERIC COMPLEX PO) Take by mouth.     No facility-administered medications prior to visit.   Past Medical History:  Diagnosis Date   Anemia    Anxiety    Arthritis    Atelectasis, left    left lower lung   Blood transfusion without reported diagnosis    Cataract cortical, senile, bilateral 03/19/2018   Complication of anesthesia 2012   "irritated trachea" with cough x  1 year from intubation 2012.  smaller tube used with other surgeries   Depression    just lost her son a month ago   Esophageal hernia    Fibromyalgia    Headache    occasionally   HTN (hypertension)    Hx of colonic polyps    Hydronephrosis of right kidney    Hypercholesterolemia    Hypertensive retinopathy of both eyes 03/19/2018   Neuropathy due to medical condition [facility])    bilateral lower legs   Obesity    Osteoarthritis (arthritis due to wear and tear of joints)    Osteoporosis    Renal angiomyolipoma    Rheumatoid arthritis [facility])    Past Surgical History:  Procedure Laterality Date   ABDOMINAL HYSTERECTOMY  1969   BACK SURGERY  2012   rod in back-   lumbar disc removal with bone graft   blephoroplasty     bilateral   BREAST BIOPSY  1989   CATARACT EXTRACTION Bilateral    05/2020, 06/2020   CATARACT EXTRACTION, BILATERAL     CHOLECYSTECTOMY  2010   COLONOSCOPY  2022   with polypectomy   EXCISIONAL TOTAL KNEE ARTHROPLASTY WITH ANTIBIOTIC SPACERS Left 11/14/2016   Procedure: EXCISIONAL LEFT TOTAL KNEE ARTHROPLASTY WITH  PLACEMENT OF ANTIBIOTIC SPACERS;  Surgeon: Arnie Lao, MD;  Location: [facility];  Service: Orthopedics;  Laterality: Left;   JOINT REPLACEMENT     NISSEN FUNDOPLICATION     ORIF FEMUR FRACTURE Right 01/04/2020   Procedure: OPEN REDUCTION INTERNAL FIXATION (ORIF) DISTAL FEMUR FRACTURE;  Surgeon: Laneta Pintos, MD;  Location: [facility] OR;  Service: Orthopedics;  Laterality: Right;   PARAESOPHAGEAL HERNIA REPAIR  1989   REVISION TOTAL KNEE ARTHROPLASTY Left 06/09/2017   TONSILLECTOMY  1946   TOTAL HIP ARTHROPLASTY Right 08/31/2012   Procedure: TOTAL HIP ARTHROPLASTY ANTERIOR APPROACH;  Surgeon: Bevin Bucks, MD;  Location: [facility];  Service: Orthopedics;  Laterality: Right;   TOTAL KNEE ARTHROPLASTY  2001   left   TOTAL KNEE ARTHROPLASTY  2010   right   TOTAL KNEE REVISION Left 06/09/2017   Procedure: LEFT REVISION KNEE ARTHROPLASTY;  Surgeon: Arnie Lao, MD;  Location: [facility] OR;  Service: Orthopedics;  Laterality: Left;   Allergies  Allergen Reactions   Ativan  [Lorazepam ] Other (See Comments)    Highly irritable   Haldol  [Haloperidol ] Other (See Comments)    Vegetative State   Epinephrine Other (See Comments)    Reaction:  Increased pts BP and caused shoulder to jerk    Levofloxacin Nausea And Vomiting   Lipitor [Atorvastatin ] Other (See Comments)    Reaction:  Joint pain and dizziness    Losartan      Dizzy   Robaxin  [Methocarbamol ] Other (See Comments)    Dizziness    Trovan [Alatrofloxacin] Nausea And Vomiting    Pt states that this med caused pancreatitis.      Objective:     Physical Exam Vitals and nursing note reviewed.  Constitutional:      Appearance: Normal appearance.  Cardiovascular:     Rate and Rhythm: Normal rate and regular rhythm.  Pulmonary:     Effort: Pulmonary effort is normal.     Breath sounds: Normal breath sounds.  Musculoskeletal:        General: Normal range of motion.  Skin:    General: Skin is warm and dry.     Findings: Laceration (8 cm laceration noted on the anterior lower leg is healing well, without evidence  of infection.) present.       Neurological:     Mental Status: She is alert.  Psychiatric:        Mood and Affect: Mood normal.        Behavior: Behavior normal.    BP (!) 146/69 (BP Location: Left Arm, Patient Position: Sitting, Cuff Size: Large)   Pulse 64   Temp 98.1 F (36.7 C) (Temporal)   Ht 5\' 7"  (1.702 m)   Wt 212 lb 8 oz (96.4 kg)   SpO2 96%   BMI 33.28 kg/m  Wt Readings from Last 3 Encounters:  10/22/23 212 lb 8 oz (96.4 kg)  10/13/23 214 lb 12.8 oz (97.4 kg)  08/05/23 220 lb 9.6 oz (100.1 kg)    S.Teryn Boerema, FNP-C

## 2023-10-27 ENCOUNTER — Ambulatory Visit: Admitting: Family Medicine

## 2023-11-03 ENCOUNTER — Other Ambulatory Visit: Payer: Self-pay | Admitting: Physician Assistant

## 2023-11-10 ENCOUNTER — Telehealth: Payer: Self-pay

## 2023-11-10 NOTE — Telephone Encounter (Signed)
 Copied from CRM [id number]. Topic: General - Other >> Nov 09, 2023  2:15 PM Dorisann Garre T wrote: Reason for CRM: PATIENT IS NEEDING A CALL BACK FROM [facility] REGARDING A APPT  Spoke wih pt and she stated that she has an appt tomorrow with Dr. Jonelle Neri @1pm 

## 2023-11-10 NOTE — Telephone Encounter (Signed)
 Copied from CRM [id number]. Topic: Clinical - Medication Question >> Nov 10, 2023 10:33 AM Kita Perish H wrote: Reason for CRM: Patient is calling to see if she can have another prescription for antibiotics sent to [facility] on [facility] for a cut on her leg she got about a month ago that provider is aware of, States that the area is still read and a little warm to the touch and wants to know if she can start the Keflex 25mg  again which she was given in [place]  when she received the cut but Dr. Jonelle Neri office removed stitches, patient states she has had sepsis before and don't want to get it again.  [place] [phone]  Message has been sent to provider to address

## 2023-11-10 NOTE — Telephone Encounter (Signed)
 Spoke with pt to let her know that should would need an OV to access the wound

## 2023-11-11 ENCOUNTER — Encounter: Payer: Self-pay | Admitting: Family Medicine

## 2023-11-11 ENCOUNTER — Ambulatory Visit: Admitting: Family Medicine

## 2023-11-11 VITALS — BP 151/66 | HR 64 | Temp 98.4°F | Ht 67.0 in | Wt 214.0 lb

## 2023-11-11 DIAGNOSIS — R600 Localized edema: Secondary | ICD-10-CM | POA: Diagnosis not present

## 2023-11-11 DIAGNOSIS — S81801S Unspecified open wound, right lower leg, sequela: Secondary | ICD-10-CM

## 2023-11-11 DIAGNOSIS — N183 Chronic kidney disease, stage 3 unspecified: Secondary | ICD-10-CM

## 2023-11-11 DIAGNOSIS — I872 Venous insufficiency (chronic) (peripheral): Secondary | ICD-10-CM

## 2023-11-11 LAB — BASIC METABOLIC PANEL WITH GFR
BUN: 16 mg/dL (ref 6–23)
CO2: 32 meq/L (ref 19–32)
Calcium: 9.6 mg/dL (ref 8.4–10.5)
Chloride: 102 meq/L (ref 96–112)
Creatinine, Ser: 1.2 mg/dL (ref 0.40–1.20)
GFR: 41.87 mL/min — ABNORMAL LOW (ref >60.00)
Glucose, Bld: 90 mg/dL (ref 70–99)
Potassium: 4.7 meq/L (ref 3.5–5.1)
Sodium: 141 meq/L (ref 135–145)

## 2023-11-11 MED ORDER — TRIAMCINOLONE ACETONIDE 0.1 % EX CREA: 1.0000 | TOPICAL_CREAM | Freq: Two times a day (BID) | CUTANEOUS | 0 refills | Status: DC

## 2023-11-11 NOTE — Progress Notes (Signed)
 See mychart note Dear Ms. Lausch, It was good seeing you today.  Your potassium levels look good. Please increase the lasix  to once daily and I will recheck these levels at your next visit to ensure they remain stable.  Sincerely, Dr. Jonelle Neri

## 2023-11-11 NOTE — Patient Instructions (Addendum)
 Please follow up as scheduled for your next visit with me: 02/03/2024   If you have any questions or concerns, please don't hesitate to send me a message via MyChart or call the office at [phone]. Thank you for visiting with us  today! It's our pleasure caring for you.    VISIT SUMMARY: During today's visit, we discussed your chronic kidney disease, lower extremity edema, skin inflammation, and rheumatoid arthritis. We reviewed your current medications and made some adjustments to better manage your symptoms.  YOUR PLAN: -RHEUMATOID ARTHRITIS: Rheumatoid arthritis is a chronic inflammatory disorder affecting your joints. There are no new acute issues noted today, and we will continue with your current management plan.  -LOWER EXTREMITY EDEMA: Lower extremity edema is swelling in your legs. We recommend taking Lasix  daily to help reduce the swelling. We will also consider potassium supplementation based on your lab results. Continue to elevate your legs to help manage the edema.  -ERYTHEMA WITH INFLAMMATION: Erythema with inflammation is redness and swelling of the skin. We will prescribe triamcinolone to help reduce the redness. Apply it once or twice daily for about a week, but avoid putting it directly on any wounds.  -CHRONIC KIDNEY DISEASE: Chronic kidney disease is a long-term condition where the kidneys do not work as well as they should. Your potassium levels were high normal in January. We will continue to monitor your kidney function and potassium levels.  INSTRUCTIONS: Please follow up with lab tests to monitor your potassium levels and kidney function. Continue with the current management plan for your rheumatoid arthritis and keep your legs elevated to help with the swelling. Apply triamcinolone as directed for your skin inflammation.                      Contains text generated by [employer]bridge.                                 Contains  text generated by [employer]bridge.

## 2023-11-11 NOTE — Progress Notes (Signed)
 Subjective  CC:  Chief Complaint  Patient presents with   Follow-up    Wound care    HPI: Laura Chambers is a 83 y.o. female who presents to the office today to address the problems listed above in the chief complaint. Discussed the use of AI scribe software for clinical note transcription with the patient, who gave verbal consent to proceed.  History of Present Illness Laura Chambers is an 83 year old female with chronic kidney disease who presents with lower extremity edema and skin inflammation and f/u leg wound, see last note. She is concerned about infection in the leg at the wound site: redness was bad yesterday but today looks better.  Wound is healing; no drainage. No pain.  She has chronic edema: takes lasix  every other day.  She spends a lot of time sitting in a recliner with her feet elevated, which helps with the swelling. Lab Results  Component Value Date   NA 142 07/23/2023   CL 103 07/23/2023   K 5.1 07/23/2023   CO2 32 07/23/2023   BUN 36 (H) 07/23/2023   CREATININE 1.49 (H) 07/23/2023   EGFR 35 (L) 07/23/2023   CALCIUM  9.8 07/23/2023   PHOS 3.7 05/08/2017   ALBUMIN 3.9 05/19/2022   GLUCOSE 75 07/23/2023      Assessment  1. Leg wound, right, sequela   2. Bilateral lower extremity edema   3. Venous stasis dermatitis of both lower extremities   4. Chronic kidney disease (CKD), active medical management without dialysis, stage 3 (moderate)      Plan  Assessment and Plan Assessment & Plan Leg wound: healing well. No sign of infection. Vaseline and gentle debridement.  Lower extremity edema Chronic edema significant. Lasix  taken every other day without potassium supplementation. Potassium levels concerning due to chronic kidney disease. - Recommend Lasix  daily. - Consider potassium supplementation based on lab review. Bmp today - Elevate legs to reduce edema.  Erythema with inflammation Erythema likely from swelling. No infection. Current  treatment includes Vaseline and lotion. - Prescribe triamcinolone for erythema. - Apply triamcinolone once or twice daily for about a week. - Avoid applying triamcinolone directly on the wound.  Chronic kidney disease Chronic kidney disease with high normal potassium levels (5.1) last checked in January. No ACE or ARB used.    Follow up: as scheduled in July for f/u Orders Placed This Encounter  Procedures   Basic metabolic panel with GFR   Meds ordered this encounter  Medications   triamcinolone cream (KENALOG) 0.1 %    Sig: Apply 1 Application topically 2 (two) times daily. For 2 weeks, then as needed    Dispense:  45 g    Refill:  0     I reviewed the patients updated PMH, FH, and SocHx.  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 dialysis, stage 3 (moderate) 12/15/2017    Priority: High   Fibromyalgia 12/15/2017    Priority: High   Reactive depression (situational) 08/17/2017    Priority: High   Essential hypertension 12/08/2016    Priority: High   Peripheral neuropathy 01/12/2014    Priority: High   Dyslipidemia 06/16/2012    Priority:  High   Bilateral lower extremity edema 11/05/2022    Priority: Medium    Anemia associated with chronic renal failure 05/16/2020    Priority: Medium    History of femur fracture supracondylar 2021, right 01/03/2020    Priority: Medium    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     Priority: Medium    Hx of colonic polyps 12/15/2017    Priority: Medium    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    Priority: Medium    DDD (degenerative disc disease), lumbar 12/15/2017    Priority: Medium    Chronic  constipation 01/12/2020    Priority: Low   History of total right hip replacement 12/15/2017    Priority: Low   S/P total knee replacement, right 12/15/2017    Priority: Low   Left foot drop 12/15/2017    Priority: Low   Status post revision of total knee, left 06/09/2017    Priority: Low   Urine retention 12/02/2016    Priority: Low   Trigeminal neuralgia of right side of face 08/19/2015   Current Meds  Medication Sig   amLODipine  (NORVASC ) 2.5 MG tablet Take 1 tablet (2.5 mg total) by mouth daily.   atenolol  (TENORMIN ) 50 MG tablet Take 1 tablet (50 mg total) by mouth 2 (two) times daily.   Calcium  Carbonate-Vitamin D  (CALCIUM  600+D) 600-400 MG-UNIT tablet Take 1 tablet by mouth every other day.   FLUoxetine  (PROZAC ) 20 MG capsule TAKE 1 CAPSULE BY MOUTH EVERY DAY   folic acid  (FOLVITE ) 800 MCG tablet Take 800 mcg daily by mouth.    furosemide  (LASIX ) 20 MG tablet Take 1 tablet (20 mg total) by mouth daily as needed for edema.   gabapentin  (NEURONTIN ) 300 MG capsule TAKE 1 CAPSULE BY MOUTH TWICE A DAY   Ginger, Zingiber officinalis, (GINGER ROOT PO) Take by mouth.   hydroxychloroquine  (PLAQUENIL ) 200 MG tablet TAKE 1 TABLET BY MOUTH EVERY DAY   Multiple Vitamin (MULTIVITAMIN WITH MINERALS) TABS tablet Take 1 tablet by mouth daily.    Omega-3 1000 MG CAPS Take 1 capsule by mouth daily.    rosuvastatin  (CRESTOR ) 10 MG tablet TAKE 1 TABLET BY MOUTH EVERY DAY   TART CHERRY PO Take by mouth.   triamcinolone cream (KENALOG) 0.1 % Apply 1 Application topically 2 (two) times daily. For 2 weeks, then as needed   Turmeric (QC TUMERIC COMPLEX PO) Take by mouth.   Allergies: Patient is allergic to ativan  [lorazepam ], haldol  [haloperidol ], epinephrine, levofloxacin, lipitor [atorvastatin ], losartan , robaxin  [methocarbamol ], and trovan [alatrofloxacin]. Family History: Patient family history includes Colon cancer (age of onset: 77) in her sister; Colon polyps in an other family member;  Diabetes in her brother, father, and sister; Heart disease in her father; Osteoarthritis in her father and mother; Pneumonia in her mother; Stroke in her father and mother. Social History:  Patient  reports that she has never smoked. She has been exposed to tobacco smoke. She has never used smokeless tobacco. She reports that she does not drink alcohol and does not use drugs.  Review of Systems: Constitutional: Negative for fever malaise or anorexia Cardiovascular: negative for chest pain Respiratory: negative for SOB or persistent cough Gastrointestinal: negative for abdominal pain  Objective  Vitals: BP (!) 151/66   Pulse 64   Temp 98.4 F (36.9 C)   Ht 5\' 7"  (1.702 m)   Wt 214 lb (97.1 kg)   SpO2 95%   BMI 33.52 kg/m  General: no acute distress , A&Ox3  HEENT: PEERL, conjunctiva normal, neck is supple Right shin: healing wound with some scabbing. + surrounding erythema w/o warmth or ttp, +2 pitting edema to knee Commons side effects, risks, benefits, and alternatives for medications and treatment plan prescribed today were discussed, and the patient expressed understanding of the given instructions. Patient is instructed to call or message via MyChart if he/she has any questions or concerns regarding our treatment plan. No barriers to understanding were identified. We discussed Red Flag symptoms and signs in detail. Patient expressed understanding regarding what to do in case of urgent or emergency type symptoms.  Medication list was reconciled, printed and provided to the patient in AVS. Patient instructions and summary information was reviewed with the patient as documented in the AVS. This note was prepared with assistance of Dragon voice recognition software. Occasional wrong-word or sound-a-like substitutions may have occurred due to the inherent limitations of voice recognition software

## 2023-11-29 ENCOUNTER — Other Ambulatory Visit: Payer: Self-pay | Admitting: Family Medicine

## 2023-11-29 DIAGNOSIS — I10 Essential (primary) hypertension: Secondary | ICD-10-CM

## 2023-12-01 ENCOUNTER — Other Ambulatory Visit: Payer: Self-pay | Admitting: Physician Assistant

## 2023-12-01 NOTE — Telephone Encounter (Signed)
 Last Fill: 09/01/2023   Eye exam: 10/29/2022 WNL     Labs: 10/13/2023 CBC WNL 11/11/2023 GFR 41.87   Next Visit: 12/31/2023   Last Visit: 07/23/2023   [id number] arthritis involving multiple sites with positive rheumatoid factor    Current Dose per office note 07/23/2023: Plaquenil  200 mg 1 tablet by mouth daily.   Patient advised she is due to update her [facility] eye exam. She states she has it scheduled for 12/09/2023 at 1:30 pm.    Okay to refill Plaquenil ?

## 2023-12-08 ENCOUNTER — Ambulatory Visit

## 2023-12-09 DIAGNOSIS — H524 Presbyopia: Secondary | ICD-10-CM | POA: Diagnosis not present

## 2023-12-09 DIAGNOSIS — M069 Rheumatoid arthritis, unspecified: Secondary | ICD-10-CM | POA: Diagnosis not present

## 2023-12-09 DIAGNOSIS — H4922 Sixth [abducent] nerve palsy, left eye: Secondary | ICD-10-CM | POA: Diagnosis not present

## 2023-12-09 DIAGNOSIS — H04123 Dry eye syndrome of bilateral lacrimal glands: Secondary | ICD-10-CM | POA: Diagnosis not present

## 2023-12-09 DIAGNOSIS — Z79899 Other long term (current) drug therapy: Secondary | ICD-10-CM | POA: Diagnosis not present

## 2023-12-31 ENCOUNTER — Ambulatory Visit: Payer: PPO | Admitting: Rheumatology

## 2024-01-14 DIAGNOSIS — Z79899 Other long term (current) drug therapy: Secondary | ICD-10-CM | POA: Diagnosis not present

## 2024-01-14 DIAGNOSIS — M069 Rheumatoid arthritis, unspecified: Secondary | ICD-10-CM | POA: Diagnosis not present

## 2024-02-03 ENCOUNTER — Ambulatory Visit: Payer: PPO | Admitting: Family Medicine

## 2024-02-10 ENCOUNTER — Ambulatory Visit: Admitting: Family Medicine

## 2024-02-11 ENCOUNTER — Ambulatory Visit: Admitting: Rheumatology

## 2024-02-15 ENCOUNTER — Ambulatory Visit: Admitting: Family Medicine

## 2024-02-19 NOTE — Progress Notes (Deleted)
 Office Visit Note  Patient: Laura Chambers             Date of Birth: [birth year]           MRN: [id number]             PCP: Jodie Lavern CROME, MD Referring: Jodie Lavern CROME, MD Visit Date: 03/03/2024 Occupation: @GUAROCC @  Subjective:  No chief complaint on file.   History of Present Illness: Laura Chambers is a 83 y.o. female ***     Activities of Daily Living:  Patient reports morning stiffness for *** {minute/hour:19697}.   Patient {ACTIONS;DENIES/REPORTS:21021675::Denies} nocturnal pain.  Difficulty dressing/grooming: {ACTIONS;DENIES/REPORTS:21021675::Denies} Difficulty climbing stairs: {ACTIONS;DENIES/REPORTS:21021675::Denies} Difficulty getting out of chair: {ACTIONS;DENIES/REPORTS:21021675::Denies} Difficulty using hands for taps, buttons, cutlery, and/or writing: {ACTIONS;DENIES/REPORTS:21021675::Denies}  No Rheumatology ROS completed.   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   Chronic kidney disease (CKD), active medical management without dialysis, stage 3 (moderate) 12/15/2017   Fibromyalgia 12/15/2017   History of total right hip replacement 12/15/2017   DDD (degenerative disc disease), lumbar 12/15/2017   S/P total knee replacement, right 12/15/2017   Left foot drop 12/15/2017   Reactive depression (situational) 08/17/2017   Status post revision of total knee, left 06/09/2017   Essential  hypertension 12/08/2016   Urine retention 12/02/2016   Trigeminal neuralgia of right side of face 08/19/2015   Peripheral neuropathy 01/12/2014   Dyslipidemia 06/16/2012    Past Medical History:  Diagnosis Date   Anemia    Anxiety    Arthritis    Atelectasis, left    left lower lung   Blood transfusion without reported diagnosis    Cataract cortical, senile, bilateral 03/19/2018   Complication of anesthesia 2012   irritated trachea with cough x 1 year from intubation 2012.  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)    Osteoporosis    Renal angiomyolipoma    Rheumatoid arthritis (HCC)     Family History  Problem Relation Age of Onset   Diabetes Father    Osteoarthritis Father    Heart disease Father    Stroke Father    Osteoarthritis Mother    Stroke Mother    Pneumonia Mother    Colon cancer Sister 38   Colon polyps Other        neice/nephew   Diabetes Sister    Diabetes Brother    Past Surgical History:  Procedure Laterality Date   ABDOMINAL HYSTERECTOMY  1969   BACK SURGERY  2012   rod in back-  lumbar disc removal with bone graft   blephoroplasty     bilateral   BREAST BIOPSY  1989   CATARACT EXTRACTION Bilateral    05/2020, 06/2020   CATARACT EXTRACTION, BILATERAL     CHOLECYSTECTOMY  2010   COLONOSCOPY  2022   with polypectomy   EXCISIONAL TOTAL KNEE ARTHROPLASTY WITH ANTIBIOTIC SPACERS Left 11/14/2016   Procedure: EXCISIONAL LEFT TOTAL KNEE ARTHROPLASTY WITH  PLACEMENT OF ANTIBIOTIC SPACERS;  Surgeon: Vernetta Lonni GRADE, MD;  Location: [facility];   Service: Orthopedics;  Laterality: Left;   JOINT REPLACEMENT     NISSEN FUNDOPLICATION     ORIF FEMUR FRACTURE Right 01/04/2020   Procedure: OPEN REDUCTION INTERNAL FIXATION (ORIF) DISTAL FEMUR FRACTURE;  Surgeon: Kendal Franky SQUIBB, MD;  Location: [facility];  Service: Orthopedics;  Laterality: Right;   PARAESOPHAGEAL HERNIA REPAIR  1989   REVISION TOTAL KNEE ARTHROPLASTY Left 06/09/2017   TONSILLECTOMY  1946   TOTAL HIP ARTHROPLASTY Right 08/31/2012   Procedure: TOTAL HIP ARTHROPLASTY ANTERIOR APPROACH;  Surgeon: Donnice JONETTA Car, MD;  Location: [facility];  Service: Orthopedics;  Laterality: Right;   TOTAL KNEE ARTHROPLASTY  2001   left   TOTAL KNEE ARTHROPLASTY  2010   right   TOTAL KNEE REVISION Left 06/09/2017   Procedure: LEFT REVISION KNEE ARTHROPLASTY;  Surgeon: Vernetta Lonni GRADE, MD;  Location: [facility];  Service: Orthopedics;  Laterality: Left;   Social History   Social History Narrative   Patient lives at home alone.   Caffeine Use: 1 cup daily   Immunization History  Administered Date(s) Administered   Fluad Quad(high Dose 65+) 04/26/2019, 05/08/2020, 04/02/2022   Influenza, High Dose Seasonal PF 03/27/2018, 05/18/2023   Influenza,inj,Quad PF,6+ Mos 03/13/2017   Influenza-Unspecified 03/26/2018, 06/04/2020, 07/18/2021   PFIZER(Purple Top)SARS-COV-2 Vaccination 09/02/2019, 09/30/2019, 05/08/2020   Pneumococcal Conjugate-13 02/26/2018   Pneumococcal Polysaccharide-23 05/17/2019     Objective: Vital Signs: There were no vitals taken for this visit.   Physical Exam   Musculoskeletal Exam: ***  CDAI Exam: CDAI Score: -- Patient Global: --; Provider Global: -- Swollen: --; Tender: -- Joint Exam 03/03/2024   No joint exam has been documented for this visit   There is currently no information documented on the homunculus. Go to the Rheumatology activity and complete the homunculus joint exam.  Investigation: No additional findings.  Imaging: No results  found.  Recent Labs: Lab Results  Component Value Date   WBC 6.6 10/13/2023   HGB 12.9 10/13/2023   PLT 163.0 10/13/2023   NA 141 11/11/2023   K 4.7 11/11/2023   CL 102 11/11/2023   CO2 32 11/11/2023   GLUCOSE 90 11/11/2023   BUN 16 11/11/2023   CREATININE 1.20 11/11/2023   BILITOT 1.3 (H) 07/23/2023   ALKPHOS 87 05/19/2022   AST 20 07/23/2023   ALT 11 07/23/2023   PROT 6.5 07/23/2023   ALBUMIN 3.9 05/19/2022   CALCIUM  9.6 11/11/2023   GFRAA 45 (L) 12/31/2020    Speciality Comments: PLQ Eye Exam 12/09/2023 WNL @ [facility]  f/u 1 year  Procedures:  No procedures performed Allergies: Ativan  [lorazepam ], Haldol  [haloperidol ], Epinephrine, Levofloxacin, Lipitor [atorvastatin ], Losartan , Robaxin  [methocarbamol ], and Trovan [alatrofloxacin]   Assessment / Plan:     Visit Diagnoses: No diagnosis found.  Orders: No orders of the defined types were placed in this encounter.  No orders of the defined types were placed in this encounter.   Face-to-face time spent with patient was ***  minutes. Greater than 50% of time was spent in counseling and coordination of care.  Follow-Up Instructions: No follow-ups on file.   Daved JAYSON Gavel, CMA  Note - This record has been created using Animal nutritionist.  Chart creation errors have been sought, but may not always  have been located. Such creation errors do not reflect on  the standard of medical care.

## 2024-02-23 ENCOUNTER — Ambulatory Visit (INDEPENDENT_AMBULATORY_CARE_PROVIDER_SITE_OTHER)

## 2024-02-23 VITALS — Ht 67.0 in | Wt 197.0 lb

## 2024-02-23 DIAGNOSIS — Z0000 Encounter for general adult medical examination without abnormal findings: Secondary | ICD-10-CM

## 2024-02-23 NOTE — Progress Notes (Signed)
 Subjective:   Laura Chambers is a 83 y.o. who presents for a Medicare Wellness preventive visit.  As a reminder, Annual Wellness Visits don't include a physical exam, and some assessments may be limited, especially if this visit is performed virtually. We may recommend an in-person follow-up visit with your provider if needed.  Visit Complete: Virtual I connected with  Laura Chambers on 02/23/24 by a audio enabled telemedicine application and verified that I am speaking with the correct person using two identifiers.  Patient Location: Home  Provider Location: Office/Clinic  I discussed the limitations of evaluation and management by telemedicine. The patient expressed understanding and agreed to proceed.  Vital Signs: Because this visit was a virtual/telehealth visit, some criteria may be missing or patient reported. Any vitals not documented were not able to be obtained and vitals that have been documented are patient reported.  VideoDeclined- This patient declined Librarian, academic. Therefore the visit was completed with audio only.  Persons Participating in Visit: Patient.  AWV Questionnaire: No: Patient Medicare AWV questionnaire was not completed prior to this visit.  Cardiac Risk Factors include: advanced age (>65men, >69 women);dyslipidemia;hypertension;obesity (BMI >30kg/m2)     Objective:    Today's Vitals   02/23/24 1352  Weight: 197 lb (89.4 kg)  Height: 5' 7 (1.702 m)   Body mass index is 30.85 kg/m.     02/23/2024    2:04 PM 10/20/2022   12:13 PM 02/21/2022   11:13 AM 10/07/2021   12:08 PM 08/05/2021    1:32 PM 07/16/2020    1:15 PM 01/30/2020    2:00 PM  Advanced Directives  Does Patient Have a Medical Advance Directive? Yes Yes Yes Yes No No Yes  Type of Estate agent of [place];Living will Healthcare Power of [place];Living will Living will;Healthcare Power of [employer] Power of Attorney   --   Does patient want to make changes to medical advance directive?   No - Patient declined    No - Patient declined  Copy of Healthcare Power of Attorney in Chart? No - copy requested No - copy requested  No - copy requested     Would patient like information on creating a medical advance directive?      No - Patient declined     Current Medications (verified) Outpatient Encounter Medications as of 02/23/2024  Medication Sig   amLODipine  (NORVASC ) 2.5 MG tablet Take 1 tablet (2.5 mg total) by mouth daily.   atenolol  (TENORMIN ) 50 MG tablet TAKE 1 TABLET BY MOUTH EVERY DAY TWICE A DAY   FLUoxetine  (PROZAC ) 20 MG capsule TAKE 1 CAPSULE BY MOUTH EVERY DAY   folic acid  (FOLVITE ) 800 MCG tablet Take 800 mcg daily by mouth.    furosemide  (LASIX ) 20 MG tablet Take 1 tablet (20 mg total) by mouth daily as needed for edema.   gabapentin  (NEURONTIN ) 300 MG capsule TAKE 1 CAPSULE BY MOUTH TWICE A DAY   Ginger, Zingiber officinalis, (GINGER ROOT PO) Take by mouth.   hydroxychloroquine  (PLAQUENIL ) 200 MG tablet TAKE 1 TABLET BY MOUTH EVERY DAY   Multiple Vitamin (MULTIVITAMIN WITH MINERALS) TABS tablet Take 1 tablet by mouth daily.    Omega-3 1000 MG CAPS Take 1 capsule by mouth daily.    rosuvastatin  (CRESTOR ) 10 MG tablet TAKE 1 TABLET BY MOUTH EVERY DAY   TART CHERRY PO Take by mouth.   triamcinolone  cream (KENALOG ) 0.1 % Apply 1 Application topically 2 (two) times daily. For  2 weeks, then as needed   Turmeric (QC TUMERIC COMPLEX PO) Take by mouth.   Calcium  Carbonate-Vitamin D  (CALCIUM  600+D) 600-400 MG-UNIT tablet Take 1 tablet by mouth every other day. (Patient not taking: Reported on 02/23/2024)   No facility-administered encounter medications on file as of 02/23/2024.    Allergies (verified) Ativan  [lorazepam ], Haldol  [haloperidol ], Epinephrine, Levofloxacin, Lipitor [atorvastatin ], Losartan , Robaxin  [methocarbamol ], and Trovan [alatrofloxacin]   History: Past Medical History:  Diagnosis Date    Anemia    Anxiety    Arthritis    Atelectasis, left    left lower lung   Blood transfusion without reported diagnosis    Cataract cortical, senile, bilateral 03/19/2018   Complication of anesthesia 2012   irritated trachea with cough x 1 year from intubation 2012.  smaller tube used with other surgeries   Depression    just lost her son a month ago   Esophageal hernia    Fibromyalgia    Headache    occasionally   HTN (hypertension)    Hx of colonic polyps    Hydronephrosis of right kidney    Hypercholesterolemia    Hypertensive retinopathy of both eyes 03/19/2018   Neuropathy due to medical condition [facility])    bilateral lower legs   Obesity    Osteoarthritis (arthritis due to wear and tear of joints)    Osteoporosis    Renal angiomyolipoma    Rheumatoid arthritis [facility])    Past Surgical History:  Procedure Laterality Date   ABDOMINAL HYSTERECTOMY  1969   BACK SURGERY  2012   rod in back-  lumbar disc removal with bone graft   blephoroplasty     bilateral   BREAST BIOPSY  1989   CATARACT EXTRACTION Bilateral    05/2020, 06/2020   CATARACT EXTRACTION, BILATERAL     CHOLECYSTECTOMY  2010   COLONOSCOPY  2022   with polypectomy   EXCISIONAL TOTAL KNEE ARTHROPLASTY WITH ANTIBIOTIC SPACERS Left 11/14/2016   Procedure: EXCISIONAL LEFT TOTAL KNEE ARTHROPLASTY WITH  PLACEMENT OF ANTIBIOTIC SPACERS;  Surgeon: Vernetta Lonni GRADE, MD;  Location: [facility];  Service: Orthopedics;  Laterality: Left;   JOINT REPLACEMENT     NISSEN FUNDOPLICATION     ORIF FEMUR FRACTURE Right 01/04/2020   Procedure: OPEN REDUCTION INTERNAL FIXATION (ORIF) DISTAL FEMUR FRACTURE;  Surgeon: Kendal Franky SQUIBB, MD;  Location: [facility] OR;  Service: Orthopedics;  Laterality: Right;   PARAESOPHAGEAL HERNIA REPAIR  1989   REVISION TOTAL KNEE ARTHROPLASTY Left 06/09/2017   TONSILLECTOMY  1946   TOTAL HIP ARTHROPLASTY Right 08/31/2012   Procedure: TOTAL HIP ARTHROPLASTY ANTERIOR APPROACH;  Surgeon: Donnice JONETTA Car, MD;  Location: [facility];  Service: Orthopedics;  Laterality: Right;   TOTAL KNEE ARTHROPLASTY  2001   left   TOTAL KNEE ARTHROPLASTY  2010   right   TOTAL KNEE REVISION Left 06/09/2017   Procedure: LEFT REVISION KNEE ARTHROPLASTY;  Surgeon: Vernetta Lonni GRADE, MD;  Location: [facility] OR;  Service: Orthopedics;  Laterality: Left;   Family History  Problem Relation Age of Onset   Diabetes Father    Osteoarthritis Father    Heart disease Father    Stroke Father    Osteoarthritis Mother    Stroke Mother    Pneumonia Mother    Colon cancer Sister 63   Colon polyps Other        neice/nephew   Diabetes Sister    Diabetes Brother    Social History   Socioeconomic History   Marital status:  Divorced    Spouse name: Not on file   Number of children: 3   Years of education: College   Highest education level: Not on file  Occupational History    Employer:     Comment: [employer]  Tobacco Use   Smoking status: Never    Passive exposure: Past   Smokeless tobacco: Never  Vaping Use   Vaping status: Never Used  Substance and Sexual Activity   Alcohol use: No   Drug use: No   Sexual activity: Never  Other Topics Concern   Not on file  Social History Narrative   Patient lives at home alone.   Caffeine Use: 1 cup daily   Social Drivers of Health   Financial Resource Strain: Low Risk  (02/23/2024)   Overall Financial Resource Strain (CARDIA)    Difficulty of Paying Living Expenses: Not hard at all  Food Insecurity: No Food Insecurity (02/23/2024)   Hunger Vital Sign    Worried About Running Out of Food in the Last Year: Never true    Ran Out of Food in the Last Year: Never true  Transportation Needs: No Transportation Needs (02/23/2024)   PRAPARE - Administrator, Civil Service (Medical): No    Lack of Transportation (Non-Medical): No  Physical Activity: Sufficiently Active (02/23/2024)   Exercise Vital Sign    Days of Exercise per Week: 7 days     Minutes of Exercise per Session: 30 min  Stress: No Stress Concern Present (02/23/2024)   [employer] of Occupational Health - Occupational Stress Questionnaire    Feeling of Stress: Not at all  Social Connections: Socially Isolated (02/23/2024)   Social Connection and Isolation Panel    Frequency of Communication with Friends and Family: More than three times a week    Frequency of Social Gatherings with Friends and Family: Once a week    Attends Religious Services: Never    Database administrator or Organizations: No    Attends Engineer, structural: Never    Marital Status: Divorced    Tobacco Counseling Counseling given: Not Answered    Clinical Intake:  Pre-visit preparation completed: Yes  Pain : No/denies pain     BMI - recorded: 30.85 Nutritional Status: BMI > 30  Obese Nutritional Risks: None Diabetes: No  No results found for: HGBA1C   How often do you need to have someone help you when you read instructions, pamphlets, or other written materials from your doctor or pharmacy?: 1 - Never  Interpreter Needed?: No  Information entered by :: Ellouise Haws, LPN   Activities of Daily Living      02/23/2024    1:55 PM  In your present state of health, do you have any difficulty performing the following activities:  Hearing? 0  Vision? 0  Difficulty concentrating or making decisions? 0  Walking or climbing stairs? 1  Comment unable to use legs due to balance  Dressing or bathing? 0  Doing errands, shopping? 0  Preparing Food and eating ? N  Using the Toilet? N  In the past six months, have you accidently leaked urine? Y  Comment at times wears a pad  Do you have problems with loss of bowel control? N  Managing your Medications? N  Managing your Finances? N  Housekeeping or managing your Housekeeping? N    Patient Care Team: Jodie Lavern CROME, MD as PCP - General (Family Medicine) Ernie Cough, MD as Consulting Physician (Orthopedic  Surgery) Lelon Lonni BIRCH, MD as Consulting Physician (Ophthalmology) Dolphus Reiter, MD as Consulting Physician (Rheumatology) Fleeta Zerita DASEN, MD as Consulting Physician (Ophthalmology) Tanda Norleen LABOR, MD as Referring Physician (Neurosurgery) Ines Onetha NOVAK, MD as Consulting Physician (Neurology)   I have updated your Care Teams any recent Medical Services you may have received from other providers in the past year.     Assessment:   This is a routine wellness examination for [place].  Hearing/Vision screen Hearing Screening - Comments:: Pt denies any hearing issues  Vision Screening - Comments:: Wears rx glasses - up to date with routine eye exams with Dr Juvenal    Goals Addressed             This Visit's Progress    Patient Stated       To be able to use cane        Depression Screen     02/23/2024    1:58 PM 11/11/2023    1:15 PM 10/22/2023    3:54 PM 10/13/2023   10:40 AM 08/05/2023    2:15 PM 12/19/2022    9:48 AM 11/05/2022   11:04 AM  PHQ 2/9 Scores  PHQ - 2 Score 0 0 0 0 0 0 0  PHQ- 9 Score  0 0        Fall Risk     02/23/2024    2:04 PM 11/11/2023    1:15 PM 10/13/2023   10:34 AM 08/05/2023    2:15 PM 12/19/2022    9:48 AM  Fall Risk   Falls in the past year? 1 0 0 1 0  Number falls in past yr: 1 0 0 0 0  Injury with Fall? 0 0 0 0 0  Risk for fall due to : History of fall(s);Impaired balance/gait;Impaired mobility No Fall Risks No Fall Risks No Fall Risks No Fall Risks  Follow up Falls prevention discussed Falls evaluation completed Falls evaluation completed Falls evaluation completed Falls evaluation completed    MEDICARE RISK AT HOME:  Medicare Risk at Home Any stairs in or around the home?: Yes If so, are there any without handrails?: No Home free of loose throw rugs in walkways, pet beds, electrical cords, etc?: No Adequate lighting in your home to reduce risk of falls?: No Life alert?: No Use of a cane, walker or w/c?: Yes Grab bars in the  bathroom?: No Shower chair or bench in shower?: Yes Elevated toilet seat or a handicapped toilet?: No  TIMED UP AND GO:  Was the test performed?  No  Cognitive Function: 6CIT completed    04/08/2018    2:54 PM  MMSE - Mini Mental State Exam  Not completed: --        02/23/2024    2:07 PM 10/20/2022   12:17 PM 10/07/2021   12:14 PM 07/16/2020    1:19 PM 04/19/2019    3:04 PM  6CIT Screen  What Year? 0 points 0 points 0 points 0 points 0 points  What month? 0 points 0 points 0 points 0 points 0 points  What time? 0 points 0 points 0 points  0 points  Count back from 20 0 points 0 points 0 points 0 points 0 points  Months in reverse 0 points 2 points 0 points 0 points 0 points  Repeat phrase 0 points 2 points 0 points 0 points 0 points  Total Score 0 points 4 points 0 points  0 points    Immunizations Immunization History  Administered Date(s) Administered   Fluad Quad(high Dose 65+) 04/26/2019, 05/08/2020, 04/02/2022   Influenza, High Dose Seasonal PF 03/27/2018, 05/18/2023   Influenza,inj,Quad PF,6+ Mos 03/13/2017   Influenza-Unspecified 03/26/2018, 06/04/2020, 07/18/2021   PFIZER(Purple Top)SARS-COV-2 Vaccination 09/02/2019, 09/30/2019, 05/08/2020   Pneumococcal Conjugate-13 02/26/2018   Pneumococcal Polysaccharide-23 05/17/2019   Tdap 10/10/2023    Screening Tests Health Maintenance  Topic Date Due   Zoster Vaccines- Shingrix  (1 of 2) Never done   COVID-19 Vaccine (4 - 2024-25 season) 03/08/2023   INFLUENZA VACCINE  02/05/2024   DEXA SCAN  08/04/2024 (Originally 02/09/2023)   Medicare Annual Wellness (AWV)  02/22/2025   Colonoscopy  04/03/2026   Pneumococcal Vaccine: 50+ Years  Completed   HPV VACCINES  Aged Out   Meningococcal B Vaccine  Aged Out   DTaP/Tdap/Td  Discontinued   Pneumococcal Vaccine  Discontinued    Health Maintenance  Health Maintenance Due  Topic Date Due   Zoster Vaccines- Shingrix  (1 of 2) Never done   COVID-19 Vaccine (4 - 2024-25  season) 03/08/2023   INFLUENZA VACCINE  02/05/2024   Health Maintenance Items Addressed: See Nurse Notes at the end of this note  Additional Screening:  Vision Screening: Recommended annual ophthalmology exams for early detection of glaucoma and other disorders of the eye. Would you like a referral to an eye doctor? No    Dental Screening: Recommended annual dental exams for proper oral hygiene  Community Resource Referral / Chronic Care Management: CRR required this visit?  No   CCM required this visit?  No   Plan:    I have personally reviewed and noted the following in the patient's chart:   Medical and social history Use of alcohol, tobacco or illicit drugs  Current medications and supplements including opioid prescriptions. Patient is not currently taking opioid prescriptions. Functional ability and status Nutritional status Physical activity Advanced directives List of other physicians Hospitalizations, surgeries, and ER visits in previous 12 months Vitals Screenings to include cognitive, depression, and falls Referrals and appointments  In addition, I have reviewed and discussed with patient certain preventive protocols, quality metrics, and best practice recommendations. A written personalized care plan for preventive services as well as general preventive health recommendations were provided to patient.   Ellouise VEAR Haws, LPN   1/80/7974   After Visit Summary: (MyChart) Due to this being a telephonic visit, the after visit summary with patients personalized plan was offered to patient via MyChart   Notes: Nothing significant to report at this time.

## 2024-02-23 NOTE — Patient Instructions (Signed)
 Ms. Stankovic , Thank you for taking time out of your busy schedule to complete your Annual Wellness Visit with me. I enjoyed our conversation and look forward to speaking with you again next year. I, as well as your care team,  appreciate your ongoing commitment to your health goals. Please review the following plan we discussed and let me know if I can assist you in the future. Your Game plan/ To Do List    Referrals: If you haven't heard from the office you've been referred to, please reach out to them at the phone provided.   Follow up Visits: We will see or speak with you next year for your Next Medicare AWV with our clinical staff Have you seen your provider in the last 6 months (3 months if uncontrolled diabetes)? Yes  Clinician Recommendations: Each day, aim for 6 glasses of water , plenty of protein in your diet and try to get up and walk/ stretch every hour for 5-10 minutes at a time.        This is a list of the screenings recommended for you:  Health Maintenance  Topic Date Due   Zoster (Shingles) Vaccine (1 of 2) Never done   COVID-19 Vaccine (4 - 2024-25 season) 03/08/2023   Medicare Annual Wellness Visit  10/20/2023   Flu Shot  02/05/2024   DEXA scan (bone density measurement)  08/04/2024*   Colon Cancer Screening  04/03/2026   Pneumococcal Vaccine for age over 18  Completed   HPV Vaccine  Aged Out   Meningitis B Vaccine  Aged Out   DTaP/Tdap/Td vaccine  Discontinued   Pneumococcal Vaccine  Discontinued  *Topic was postponed. The date shown is not the original due date.    Advanced directives: (Copy Requested) Please bring a copy of your health care power of attorney and living will to the office to be added to your chart at your convenience. You can mail to [facility] [address] [place], [place] [address] or email to [email or web address] Advance Care Planning is important because it:  [x]  Makes sure you receive the medical care that is  consistent with your values, goals, and preferences  [x]  It provides guidance to your family and loved ones and reduces their decisional burden about whether or not they are making the right decisions based on your wishes.  Follow the link provided in your after visit summary or read over the paperwork we have mailed to you to help you started getting your Advance Directives in place. If you need assistance in completing these, please reach out to us  so that we can help you!  See attachments for Preventive Care and Fall Prevention Tips.

## 2024-02-29 ENCOUNTER — Other Ambulatory Visit: Payer: Self-pay | Admitting: Rheumatology

## 2024-02-29 ENCOUNTER — Other Ambulatory Visit: Payer: Self-pay | Admitting: Family Medicine

## 2024-02-29 DIAGNOSIS — F329 Major depressive disorder, single episode, unspecified: Secondary | ICD-10-CM

## 2024-02-29 NOTE — Telephone Encounter (Signed)
 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   Okay to refill Plaquenil ?

## 2024-03-01 NOTE — Telephone Encounter (Signed)
 10/22/2023 LOV  03/01/2024 fill date  90/3 refills

## 2024-03-03 ENCOUNTER — Ambulatory Visit: Admitting: Rheumatology

## 2024-03-03 DIAGNOSIS — M21372 Foot drop, left foot: Secondary | ICD-10-CM

## 2024-03-03 DIAGNOSIS — Z8669 Personal history of other diseases of the nervous system and sense organs: Secondary | ICD-10-CM

## 2024-03-03 DIAGNOSIS — Z860100 Personal history of colon polyps, unspecified: Secondary | ICD-10-CM

## 2024-03-03 DIAGNOSIS — Z96659 Presence of unspecified artificial knee joint: Secondary | ICD-10-CM

## 2024-03-03 DIAGNOSIS — Z96652 Presence of left artificial knee joint: Secondary | ICD-10-CM

## 2024-03-03 DIAGNOSIS — M797 Fibromyalgia: Secondary | ICD-10-CM

## 2024-03-03 DIAGNOSIS — M810 Age-related osteoporosis without current pathological fracture: Secondary | ICD-10-CM

## 2024-03-03 DIAGNOSIS — N183 Chronic kidney disease, stage 3 unspecified: Secondary | ICD-10-CM

## 2024-03-03 DIAGNOSIS — I10 Essential (primary) hypertension: Secondary | ICD-10-CM

## 2024-03-03 DIAGNOSIS — M51369 Other intervertebral disc degeneration, lumbar region without mention of lumbar back pain or lower extremity pain: Secondary | ICD-10-CM

## 2024-03-03 DIAGNOSIS — E785 Hyperlipidemia, unspecified: Secondary | ICD-10-CM

## 2024-03-03 DIAGNOSIS — G9389 Other specified disorders of brain: Secondary | ICD-10-CM

## 2024-03-03 DIAGNOSIS — Z96651 Presence of right artificial knee joint: Secondary | ICD-10-CM

## 2024-03-03 DIAGNOSIS — Z8659 Personal history of other mental and behavioral disorders: Secondary | ICD-10-CM

## 2024-03-03 DIAGNOSIS — M0579 Rheumatoid arthritis with rheumatoid factor of multiple sites without organ or systems involvement: Secondary | ICD-10-CM

## 2024-03-03 DIAGNOSIS — Z96641 Presence of right artificial hip joint: Secondary | ICD-10-CM

## 2024-03-03 DIAGNOSIS — Z79899 Other long term (current) drug therapy: Secondary | ICD-10-CM

## 2024-03-03 DIAGNOSIS — S72451D Displaced supracondylar fracture without intracondylar extension of lower end of right femur, subsequent encounter for closed fracture with routine healing: Secondary | ICD-10-CM

## 2024-03-03 DIAGNOSIS — D1771 Benign lipomatous neoplasm of kidney: Secondary | ICD-10-CM

## 2024-03-22 NOTE — Progress Notes (Deleted)
 Office Visit Note  Patient: Laura Chambers             Date of Birth: 1941-04-20           MRN: [id number]             PCP: Jodie Lavern CROME, MD Referring: Jodie Lavern CROME, MD Visit Date: 04/05/2024 Occupation: @GUAROCC @  Subjective:  No chief complaint on file.   History of Present Illness: ANMOL PASCHEN is a 83 y.o. female ***     Activities of Daily Living:  Patient reports morning stiffness for *** {minute/hour:19697}.   Patient {ACTIONS;DENIES/REPORTS:21021675::Denies} nocturnal pain.  Difficulty dressing/grooming: {ACTIONS;DENIES/REPORTS:21021675::Denies} Difficulty climbing stairs: {ACTIONS;DENIES/REPORTS:21021675::Denies} Difficulty getting out of chair: {ACTIONS;DENIES/REPORTS:21021675::Denies} Difficulty using hands for taps, buttons, cutlery, and/or writing: {ACTIONS;DENIES/REPORTS:21021675::Denies}  No Rheumatology ROS completed.   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   Chronic kidney disease (CKD), active medical management without dialysis, stage 3 (moderate) 12/15/2017   Fibromyalgia 12/15/2017   History of total right hip replacement 12/15/2017   DDD (degenerative disc disease), lumbar 12/15/2017   S/P total knee replacement, right 12/15/2017   Left foot drop 12/15/2017   Reactive depression (situational) 08/17/2017   Status post revision of total knee, left 06/09/2017   Essential  hypertension 12/08/2016   Urine retention 12/02/2016   Trigeminal neuralgia of right side of face 08/19/2015   Peripheral neuropathy 01/12/2014   Dyslipidemia 06/16/2012    Past Medical History:  Diagnosis Date   Anemia    Anxiety    Arthritis    Atelectasis, left    left lower lung   Blood transfusion without reported diagnosis    Cataract cortical, senile, bilateral 03/19/2018   Complication of anesthesia 2012   irritated trachea with cough x 1 year from intubation 2012.  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)    Osteoporosis    Renal angiomyolipoma    Rheumatoid arthritis (HCC)     Family History  Problem Relation Age of Onset   Diabetes Father    Osteoarthritis Father    Heart disease Father    Stroke Father    Osteoarthritis Mother    Stroke Mother    Pneumonia Mother    Colon cancer Sister 68   Colon polyps Other        neice/nephew   Diabetes Sister    Diabetes Brother    Past Surgical History:  Procedure Laterality Date   ABDOMINAL HYSTERECTOMY  1969   BACK SURGERY  2012   rod in back-  lumbar disc removal with bone graft   blephoroplasty     bilateral   BREAST BIOPSY  1989   CATARACT EXTRACTION Bilateral    05/2020, 06/2020   CATARACT EXTRACTION, BILATERAL     CHOLECYSTECTOMY  2010   COLONOSCOPY  2022   with polypectomy   EXCISIONAL TOTAL KNEE ARTHROPLASTY WITH ANTIBIOTIC SPACERS Left 11/14/2016   Procedure: EXCISIONAL LEFT TOTAL KNEE ARTHROPLASTY WITH  PLACEMENT OF ANTIBIOTIC SPACERS;  Surgeon: Vernetta Lonni GRADE, MD;  Location: [facility];   Service: Orthopedics;  Laterality: Left;   JOINT REPLACEMENT     NISSEN FUNDOPLICATION     ORIF FEMUR FRACTURE Right 01/04/2020   Procedure: OPEN REDUCTION INTERNAL FIXATION (ORIF) DISTAL FEMUR FRACTURE;  Surgeon: Kendal Franky SQUIBB, MD;  Location: [facility];  Service: Orthopedics;  Laterality: Right;   PARAESOPHAGEAL HERNIA REPAIR  1989   REVISION TOTAL KNEE ARTHROPLASTY Left 06/09/2017   TONSILLECTOMY  1946   TOTAL HIP ARTHROPLASTY Right 08/31/2012   Procedure: TOTAL HIP ARTHROPLASTY ANTERIOR APPROACH;  Surgeon: Donnice JONETTA Car, MD;  Location: [facility];  Service: Orthopedics;  Laterality: Right;   TOTAL KNEE ARTHROPLASTY  2001   left   TOTAL KNEE ARTHROPLASTY  2010   right   TOTAL KNEE REVISION Left 06/09/2017   Procedure: LEFT REVISION KNEE ARTHROPLASTY;  Surgeon: Vernetta Lonni GRADE, MD;  Location: [facility];  Service: Orthopedics;  Laterality: Left;   Social History   Social History Narrative   Patient lives at home alone.   Caffeine Use: 1 cup daily   Immunization History  Administered Date(s) Administered   Fluad Quad(high Dose 65+) 04/26/2019, 05/08/2020, 04/02/2022   INFLUENZA, HIGH DOSE SEASONAL PF 03/27/2018, 05/18/2023   Influenza,inj,Quad PF,6+ Mos 03/13/2017   Influenza-Unspecified 03/26/2018, 06/04/2020, 07/18/2021   PFIZER(Purple Top)SARS-COV-2 Vaccination 09/02/2019, 09/30/2019, 05/08/2020   Pneumococcal Conjugate-13 02/26/2018   Pneumococcal Polysaccharide-23 05/17/2019   Tdap 10/10/2023     Objective: Vital Signs: There were no vitals taken for this visit.   Physical Exam   Musculoskeletal Exam: ***  CDAI Exam: CDAI Score: -- Patient Global: --; Provider Global: -- Swollen: --; Tender: -- Joint Exam 04/05/2024   No joint exam has been documented for this visit   There is currently no information documented on the homunculus. Go to the Rheumatology activity and complete the homunculus joint exam.  Investigation: No additional findings.  Imaging: No  results found.  Recent Labs: Lab Results  Component Value Date   WBC 6.6 10/13/2023   HGB 12.9 10/13/2023   PLT 163.0 10/13/2023   NA 141 11/11/2023   K 4.7 11/11/2023   CL 102 11/11/2023   CO2 32 11/11/2023   GLUCOSE 90 11/11/2023   BUN 16 11/11/2023   CREATININE 1.20 11/11/2023   BILITOT 1.3 (H) 07/23/2023   ALKPHOS 87 05/19/2022   AST 20 07/23/2023   ALT 11 07/23/2023   PROT 6.5 07/23/2023   ALBUMIN 3.9 05/19/2022   CALCIUM  9.6 11/11/2023   GFRAA 45 (L) 12/31/2020    Speciality Comments: PLQ Eye Exam 12/09/2023 WNL @ [facility]  f/u 1 year  Procedures:  No procedures performed Allergies: Ativan  [lorazepam ], Haldol  [haloperidol ], Epinephrine, Levofloxacin, Lipitor [atorvastatin ], Losartan , Robaxin  [methocarbamol ], and Trovan [alatrofloxacin]   Assessment / Plan:     Visit Diagnoses: No diagnosis found.  Orders: No orders of the defined types were placed in this encounter.  No orders of the defined types were placed in this encounter.   Face-to-face time  spent with patient was *** minutes. Greater than 50% of time was spent in counseling and coordination of care.  Follow-Up Instructions: No follow-ups on file.   Daved JAYSON Gavel, CMA  Note - This record has been created using Animal nutritionist.  Chart creation errors have been sought, but may not always  have been located. Such creation errors do not reflect on  the standard of medical care.

## 2024-04-05 ENCOUNTER — Ambulatory Visit: Admitting: Rheumatology

## 2024-04-05 DIAGNOSIS — Z96652 Presence of left artificial knee joint: Secondary | ICD-10-CM

## 2024-04-05 DIAGNOSIS — Z8659 Personal history of other mental and behavioral disorders: Secondary | ICD-10-CM

## 2024-04-05 DIAGNOSIS — S72451D Displaced supracondylar fracture without intracondylar extension of lower end of right femur, subsequent encounter for closed fracture with routine healing: Secondary | ICD-10-CM

## 2024-04-05 DIAGNOSIS — M51369 Other intervertebral disc degeneration, lumbar region without mention of lumbar back pain or lower extremity pain: Secondary | ICD-10-CM

## 2024-04-05 DIAGNOSIS — N183 Chronic kidney disease, stage 3 unspecified: Secondary | ICD-10-CM

## 2024-04-05 DIAGNOSIS — I10 Essential (primary) hypertension: Secondary | ICD-10-CM

## 2024-04-05 DIAGNOSIS — M0579 Rheumatoid arthritis with rheumatoid factor of multiple sites without organ or systems involvement: Secondary | ICD-10-CM

## 2024-04-05 DIAGNOSIS — G9389 Other specified disorders of brain: Secondary | ICD-10-CM

## 2024-04-05 DIAGNOSIS — E785 Hyperlipidemia, unspecified: Secondary | ICD-10-CM

## 2024-04-05 DIAGNOSIS — M21372 Foot drop, left foot: Secondary | ICD-10-CM

## 2024-04-05 DIAGNOSIS — Z96651 Presence of right artificial knee joint: Secondary | ICD-10-CM

## 2024-04-05 DIAGNOSIS — M797 Fibromyalgia: Secondary | ICD-10-CM

## 2024-04-05 DIAGNOSIS — Z79899 Other long term (current) drug therapy: Secondary | ICD-10-CM

## 2024-04-05 DIAGNOSIS — Z96659 Presence of unspecified artificial knee joint: Secondary | ICD-10-CM

## 2024-04-05 DIAGNOSIS — D1771 Benign lipomatous neoplasm of kidney: Secondary | ICD-10-CM

## 2024-04-05 DIAGNOSIS — M810 Age-related osteoporosis without current pathological fracture: Secondary | ICD-10-CM

## 2024-04-05 DIAGNOSIS — Z860100 Personal history of colon polyps, unspecified: Secondary | ICD-10-CM

## 2024-04-05 DIAGNOSIS — Z96641 Presence of right artificial hip joint: Secondary | ICD-10-CM

## 2024-04-05 DIAGNOSIS — Z8669 Personal history of other diseases of the nervous system and sense organs: Secondary | ICD-10-CM

## 2024-04-12 NOTE — Progress Notes (Signed)
 Office Visit Note  Patient: Laura Chambers             Date of Birth: 1940/10/16           MRN: [id number]             PCP: Jodie Lavern CROME, MD Referring: Jodie Lavern CROME, MD Visit Date: 04/26/2024 Occupation: Data Unavailable  Subjective:  Medication management  History of Present Illness: Laura Chambers is a 83 y.o. female with seropositive rheumatoid arthritis, osteoarthritis, degenerative disc disease, fibromyalgia and osteoporosis.  She returns today after her last visit in January 2025.  She states she hit her left hand against a table at night while she was sleeping.  She had a lot of pain and swelling in her hands for the last couple of weeks which is gradually getting better.  She has been taking hydroxychloroquine  200 mg daily without any interruption.  None of the other joints are painful or swollen.  She denies  discomfort in her knee joints which are replaced.  She ambulates with the help of a walker and also uses wheelchair at home.  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    Activities of Daily Living:  Patient reports morning stiffness for a few minutes.   Patient Denies nocturnal pain.  Difficulty dressing/grooming: Reports Difficulty climbing stairs: Reports Difficulty getting out of chair: Reports Difficulty using hands for taps, buttons, cutlery, and/or writing: Denies  Review of Systems  Constitutional:  Positive for fatigue.  HENT:  Negative for mouth sores and mouth dryness.   Eyes:  Positive for photophobia, visual disturbance and dryness.  Respiratory:  Negative for shortness of breath.   Cardiovascular:  Negative for chest pain and palpitations.  Gastrointestinal:  Negative for blood in stool, constipation and diarrhea.  Endocrine: Negative for increased urination.  Genitourinary:  Negative for involuntary urination.  Musculoskeletal:  Positive for joint pain, gait problem, joint pain, joint swelling, myalgias,  muscle weakness, morning stiffness, muscle tenderness and myalgias.  Skin:  Negative for color change, rash, hair loss and sensitivity to sunlight.  Allergic/Immunologic: Negative for susceptible to infections.  Neurological:  Positive for dizziness and headaches.  Hematological:  Negative for swollen glands.  Psychiatric/Behavioral:  Negative for depressed mood and sleep disturbance. The patient is not nervous/anxious.     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   Chronic kidney disease (CKD), active medical management without dialysis, stage 3 (moderate) 12/15/2017   Fibromyalgia 12/15/2017   History of total right hip replacement 12/15/2017   DDD (degenerative disc disease), lumbar 12/15/2017   S/P total knee replacement, right 12/15/2017   Left foot drop 12/15/2017   Reactive depression (situational) 08/17/2017   Status post revision of total knee, left 06/09/2017   Essential hypertension 12/08/2016   Urine retention 12/02/2016   Trigeminal neuralgia of right side of face 08/19/2015   Peripheral neuropathy 01/12/2014   Dyslipidemia 06/16/2012    Past Medical History:  Diagnosis Date   Anemia    Anxiety    Arthritis    Atelectasis, left    left lower lung   Blood transfusion without reported diagnosis    Cataract cortical, senile, bilateral 03/19/2018   Complication of anesthesia 2012   irritated trachea with cough x 1 year  from intubation 2012.  smaller tube used with other surgeries   Depression    just lost her son a month ago   Esophageal hernia    Fibromyalgia    Headache    occasionally   HTN (hypertension)    Hx of colonic polyps    Hydronephrosis of right kidney    Hypercholesterolemia    Hypertensive retinopathy of both eyes 03/19/2018   Neuropathy due to medical condition    bilateral lower legs   Obesity    Osteoarthritis (arthritis due to wear and tear of joints)    Osteoporosis    Renal angiomyolipoma    Rheumatoid arthritis (HCC)     Family History  Problem Relation Age of Onset   Diabetes Father    Osteoarthritis Father    Heart disease Father    Stroke Father    Osteoarthritis Mother    Stroke Mother    Pneumonia Mother    Colon cancer Sister 17   Colon polyps Other        neice/nephew   Diabetes Sister    Diabetes Brother    Past Surgical History:  Procedure Laterality Date   ABDOMINAL HYSTERECTOMY  1969   BACK SURGERY  2012   rod in back-  lumbar disc removal with bone graft   blephoroplasty     bilateral   BREAST BIOPSY  1989   CATARACT EXTRACTION Bilateral    05/2020, 06/2020   CATARACT EXTRACTION, BILATERAL     CHOLECYSTECTOMY  2010   COLONOSCOPY  2022   with polypectomy   EXCISIONAL TOTAL KNEE ARTHROPLASTY WITH ANTIBIOTIC SPACERS Left 11/14/2016   Procedure: EXCISIONAL LEFT TOTAL KNEE ARTHROPLASTY WITH  PLACEMENT OF ANTIBIOTIC SPACERS;  Surgeon: Vernetta Lonni GRADE, MD;  Location: [facility];  Service: Orthopedics;  Laterality: Left;   JOINT REPLACEMENT     NISSEN FUNDOPLICATION     ORIF FEMUR FRACTURE Right 01/04/2020   Procedure: OPEN REDUCTION INTERNAL FIXATION (ORIF) DISTAL FEMUR FRACTURE;  Surgeon: Kendal Franky SQUIBB, MD;  Location: [facility];  Service: Orthopedics;  Laterality: Right;   PARAESOPHAGEAL HERNIA REPAIR  1989   REVISION TOTAL KNEE ARTHROPLASTY Left 06/09/2017   TONSILLECTOMY  1946   TOTAL HIP ARTHROPLASTY Right 08/31/2012   Procedure: TOTAL HIP  ARTHROPLASTY ANTERIOR APPROACH;  Surgeon: Donnice JONETTA Car, MD;  Location: [facility];  Service: Orthopedics;  Laterality: Right;   TOTAL KNEE ARTHROPLASTY  2001   left   TOTAL KNEE ARTHROPLASTY  2010   right   TOTAL KNEE REVISION Left 06/09/2017   Procedure: LEFT REVISION KNEE ARTHROPLASTY;  Surgeon: Vernetta Lonni GRADE, MD;  Location: [facility];  Service: Orthopedics;  Laterality: Left;   Social History   Tobacco Use   Smoking status: Never    Passive exposure: Past   Smokeless tobacco: Never  Vaping Use   Vaping status: Never Used  Substance Use Topics   Alcohol use: No   Drug use: No   Social History   Social History Narrative   Patient lives at home alone.   Caffeine Use: 1 cup daily     Immunization History  Administered Date(s) Administered   Fluad Quad(high Dose 65+) 04/26/2019, 05/08/2020,  04/02/2022   INFLUENZA, HIGH DOSE SEASONAL PF 03/27/2018, 05/18/2023   Influenza,inj,Quad PF,6+ Mos 03/13/2017   Influenza-Unspecified 03/26/2018, 06/04/2020, 07/18/2021, 04/09/2024   PFIZER(Purple Top)SARS-COV-2 Vaccination 09/02/2019, 09/30/2019, 05/08/2020   Pneumococcal Conjugate-13 02/26/2018   Pneumococcal Polysaccharide-23 05/17/2019   Tdap 10/10/2023     Objective: Vital Signs: BP 121/75   Pulse (!) 56   Temp 98.2 F (36.8 C)   Resp 13   Ht 5' 7 (1.702 m)   Wt 209 lb 9.6 oz (95.1 kg)   BMI 32.83 kg/m    Physical Exam Vitals and nursing note reviewed.  Constitutional:      Appearance: She is well-developed.  HENT:     Head: Normocephalic and atraumatic.  Eyes:     Conjunctiva/sclera: Conjunctivae normal.  Cardiovascular:     Rate and Rhythm: Normal rate and regular rhythm.     Heart sounds: Normal heart sounds.  Pulmonary:     Effort: Pulmonary effort is normal.     Breath sounds: Normal breath sounds.  Abdominal:     General: Bowel sounds are normal.     Palpations: Abdomen is soft.  Musculoskeletal:     Cervical back: Normal range of motion.   Lymphadenopathy:     Cervical: No cervical adenopathy.  Skin:    General: Skin is warm and dry.     Capillary Refill: Capillary refill takes less than 2 seconds.  Neurological:     Mental Status: She is alert and oriented to person, place, and time.  Psychiatric:        Behavior: Behavior normal.      Musculoskeletal Exam: Patient examined in the seated position.  She had good range of motion of the cervical spine.  There was no tenderness over thoracic or lumbar spine.  There was no SI joint tenderness.  Shoulder joints, elbow joints, wrist joints, MCPs, PIPs and DIPs were in good range of motion with no synovitis.  Hip joints could not be assessed in the seated position.  Knee joints were replaced and were in good range of motion.  She had left foot drop.  There was no tenderness over ankles or MTPs.    CDAI Exam: CDAI Score: -- Patient Global: 0 / 100; Provider Global: 0 / 100 Swollen: --; Tender: -- Joint Exam 04/26/2024   No joint exam has been documented for this visit   There is currently no information documented on the homunculus. Go to the Rheumatology activity and complete the homunculus joint exam.  Investigation: No additional findings.  Imaging: No results found.  Recent Labs: Lab Results  Component Value Date   WBC 6.6 10/13/2023   HGB 12.9 10/13/2023   PLT 163.0 10/13/2023   NA 141 11/11/2023   K 4.7 11/11/2023   CL 102 11/11/2023   CO2 32 11/11/2023   GLUCOSE 90 11/11/2023   BUN 16 11/11/2023   CREATININE 1.20 11/11/2023   BILITOT 1.3 (H) 07/23/2023   ALKPHOS 87 05/19/2022   AST 20 07/23/2023   ALT 11 07/23/2023   PROT 6.5 07/23/2023   ALBUMIN 3.9 05/19/2022   CALCIUM  9.6 11/11/2023   GFRAA 45 (L) 12/31/2020    Speciality Comments: PLQ Eye Exam 12/09/2023 WNL @ [facility]  f/u 1 year  Procedures:  No procedures performed Allergies: Ativan  [lorazepam ], Haldol  [haloperidol ], Epinephrine, Levofloxacin, Lipitor [atorvastatin ], Losartan ,  Robaxin  [methocarbamol ], and Trovan [alatrofloxacin]   Assessment / Plan:     Visit Diagnoses: Rheumatoid arthritis involving multiple sites with positive rheumatoid factor (HCC) -  Severe rheumatoid arthritis noted on the x-rays in the past.  She had no synovitis on the examination.  She denies having a rheumatoid arthritis flare.  She states she bumped her left hand against a table at night which caused swelling in her left hand which gradually resolved over the last 2 weeks.  No synovitis or swelling was noted on the examination today.  High risk medication use - Plaquenil  200 mg 1 tablet by mouth daily. PLQ Eye Exam 12/09/2023 -CBC was normal on October 13, 2023.  CMP was normal July 23, 2023 except elevated creatinine at 1.49.  Repeat creatinine on Nov 11, 2023 was 1.20.  Will check labs today.  Plan: CBC with Differential/Platelet, Comprehensive metabolic panel with GFR.  Her eye exam was normal on December 09, 2023.  Information reimmunization was placed in the AVS.  Displaced supracondylar fracture without intracondylar extension of lower end of right femur, subsequent encounter for closed fracture with routine healing - Followed by Dr. Kendal.  Doing well.  History of total right hip replacement - Followed by Dr. Kendal after right femur fracture in June 2021.  S/P total knee replacement, right-doing well without any warmth or swelling.  Status post revision of total knee, left-she denies any discomfort today.  Infected prosthetic knee joint, sequela  Degeneration of intervertebral disc of lumbar region without discogenic back pain or lower extremity pain-she has intermittent lower back pain.  She ambulates with the help of a walker.  Left foot drop-unchanged.  Age-related osteoporosis without current pathological fracture - February 08, 2021 DEXA scan showed a T score of -3.1 in the left radius.  She decided not to take any medications.  Prolia was discussed in the past.  Fibromyalgia-she  continues to have some generalized pain and discomfort which is manageable.  Brain mass - December 2022 the patient experienced an episode of diplopia and was evaluated by her ophthalmologist Dr. [facility] urgently.  She is closely followed by oncology.  She has been experiencing diplopia.  Other medical problems are listed as follows:  Dyslipidemia  Chronic kidney disease (CKD), active medical management without dialysis, stage 3 (moderate) (HCC)  Renal angiomyolipoma  History of depression  History of anxiety  Essential hypertension  Hx of peripheral neuropathy  Hx of colonic polyps  Orders: Orders Placed This Encounter  Procedures   CBC with Differential/Platelet   Comprehensive metabolic panel with GFR   No orders of the defined types were placed in this encounter.    Follow-Up Instructions: Return for Rheumatoid arthritis, Osteoarthritis.   Maya Nash, MD  Note - This record has been created using Animal nutritionist.  Chart creation errors have been sought, but may not always  have been located. Such creation errors do not reflect on  the standard of medical care.

## 2024-04-15 ENCOUNTER — Other Ambulatory Visit: Payer: Self-pay | Admitting: Family Medicine

## 2024-04-15 DIAGNOSIS — E785 Hyperlipidemia, unspecified: Secondary | ICD-10-CM

## 2024-04-24 ENCOUNTER — Other Ambulatory Visit: Payer: Self-pay | Admitting: Family Medicine

## 2024-04-26 ENCOUNTER — Encounter: Payer: Self-pay | Admitting: Rheumatology

## 2024-04-26 ENCOUNTER — Ambulatory Visit: Attending: Rheumatology | Admitting: Rheumatology

## 2024-04-26 VITALS — BP 121/75 | HR 56 | Temp 98.2°F | Resp 13 | Ht 67.0 in | Wt 209.6 lb

## 2024-04-26 DIAGNOSIS — M51369 Other intervertebral disc degeneration, lumbar region without mention of lumbar back pain or lower extremity pain: Secondary | ICD-10-CM | POA: Diagnosis not present

## 2024-04-26 DIAGNOSIS — M0579 Rheumatoid arthritis with rheumatoid factor of multiple sites without organ or systems involvement: Secondary | ICD-10-CM

## 2024-04-26 DIAGNOSIS — Z96641 Presence of right artificial hip joint: Secondary | ICD-10-CM

## 2024-04-26 DIAGNOSIS — T8459XS Infection and inflammatory reaction due to other internal joint prosthesis, sequela: Secondary | ICD-10-CM | POA: Diagnosis not present

## 2024-04-26 DIAGNOSIS — Z96659 Presence of unspecified artificial knee joint: Secondary | ICD-10-CM

## 2024-04-26 DIAGNOSIS — M797 Fibromyalgia: Secondary | ICD-10-CM

## 2024-04-26 DIAGNOSIS — E785 Hyperlipidemia, unspecified: Secondary | ICD-10-CM

## 2024-04-26 DIAGNOSIS — S72451D Displaced supracondylar fracture without intracondylar extension of lower end of right femur, subsequent encounter for closed fracture with routine healing: Secondary | ICD-10-CM | POA: Diagnosis not present

## 2024-04-26 DIAGNOSIS — Z79899 Other long term (current) drug therapy: Secondary | ICD-10-CM

## 2024-04-26 DIAGNOSIS — Z96651 Presence of right artificial knee joint: Secondary | ICD-10-CM | POA: Diagnosis not present

## 2024-04-26 DIAGNOSIS — I10 Essential (primary) hypertension: Secondary | ICD-10-CM

## 2024-04-26 DIAGNOSIS — Z8659 Personal history of other mental and behavioral disorders: Secondary | ICD-10-CM

## 2024-04-26 DIAGNOSIS — Z96652 Presence of left artificial knee joint: Secondary | ICD-10-CM | POA: Diagnosis not present

## 2024-04-26 DIAGNOSIS — M810 Age-related osteoporosis without current pathological fracture: Secondary | ICD-10-CM | POA: Diagnosis not present

## 2024-04-26 DIAGNOSIS — Z860100 Personal history of colon polyps, unspecified: Secondary | ICD-10-CM

## 2024-04-26 DIAGNOSIS — D1771 Benign lipomatous neoplasm of kidney: Secondary | ICD-10-CM

## 2024-04-26 DIAGNOSIS — G9389 Other specified disorders of brain: Secondary | ICD-10-CM

## 2024-04-26 DIAGNOSIS — Z8669 Personal history of other diseases of the nervous system and sense organs: Secondary | ICD-10-CM

## 2024-04-26 DIAGNOSIS — M21372 Foot drop, left foot: Secondary | ICD-10-CM | POA: Diagnosis not present

## 2024-04-26 DIAGNOSIS — N183 Chronic kidney disease, stage 3 unspecified: Secondary | ICD-10-CM

## 2024-04-26 LAB — CBC WITH DIFFERENTIAL/PLATELET
Absolute Lymphocytes: 1683 {cells}/uL (ref 850–3900)
Absolute Monocytes: 774 {cells}/uL (ref 200–950)
Basophils Absolute: 43 {cells}/uL (ref 0–200)
Basophils Relative: 0.6 %
Eosinophils Absolute: 284 {cells}/uL (ref 15–500)
Eosinophils Relative: 4 %
HCT: 42.3 % (ref 35.0–45.0)
Hemoglobin: 13.5 g/dL (ref 11.7–15.5)
MCH: 29.1 pg (ref 27.0–33.0)
MCHC: 31.9 g/dL — ABNORMAL LOW (ref 32.0–36.0)
MCV: 91.2 fL (ref 80.0–100.0)
MPV: 13 fL — ABNORMAL HIGH (ref 7.5–12.5)
Monocytes Relative: 10.9 %
Neutro Abs: 4317 {cells}/uL (ref 1500–7800)
Neutrophils Relative %: 60.8 %
Platelets: 175 Thousand/uL (ref 140–400)
RBC: 4.64 Million/uL (ref 3.80–5.10)
RDW: 13.1 % (ref 11.0–15.0)
Total Lymphocyte: 23.7 %
WBC: 7.1 Thousand/uL (ref 3.8–10.8)

## 2024-04-26 LAB — COMPREHENSIVE METABOLIC PANEL WITH GFR
AG Ratio: 1.7 (calc) (ref 1.0–2.5)
ALT: 13 U/L (ref 6–29)
AST: 22 U/L (ref 10–35)
Albumin: 4 g/dL (ref 3.6–5.1)
Alkaline phosphatase (APISO): 67 U/L (ref 37–153)
BUN/Creatinine Ratio: 18 (calc) (ref 6–22)
BUN: 21 mg/dL (ref 7–25)
CO2: 29 mmol/L (ref 20–32)
Calcium: 9.4 mg/dL (ref 8.6–10.4)
Chloride: 107 mmol/L (ref 98–110)
Creat: 1.16 mg/dL — ABNORMAL HIGH (ref 0.60–0.95)
Globulin: 2.4 g/dL (ref 1.9–3.7)
Glucose, Bld: 83 mg/dL (ref 65–99)
Potassium: 4.6 mmol/L (ref 3.5–5.3)
Sodium: 145 mmol/L (ref 135–146)
Total Bilirubin: 0.9 mg/dL (ref 0.2–1.2)
Total Protein: 6.4 g/dL (ref 6.1–8.1)
eGFR: 47 mL/min/1.73m2 — ABNORMAL LOW (ref >=60)

## 2024-04-26 NOTE — Patient Instructions (Addendum)
 Vaccines You are taking a medication(s) that can suppress your immune system.  The following immunizations are recommended: Flu annually (high dose) Covid-19  RSV Td/Tdap (tetanus, diphtheria, pertussis) every 10 years Pneumonia (Prevnar 15 then Pneumovax 23 at least 1 year apart.  Alternatively, can take Prevnar 20 without needing additional dose) Shingrix : 2 doses from 4 weeks to 6 months apart  Please check with your PCP to make sure you are up to date.

## 2024-04-27 ENCOUNTER — Ambulatory Visit: Payer: Self-pay | Admitting: Rheumatology

## 2024-04-27 NOTE — Progress Notes (Signed)
 CBC and CMP are stable.  Creatinine is mildly elevated.  Please forward results to her PCP.

## 2024-06-02 ENCOUNTER — Other Ambulatory Visit: Payer: Self-pay | Admitting: Physician Assistant

## 2024-06-06 ENCOUNTER — Telehealth: Payer: Self-pay | Admitting: Rheumatology

## 2024-06-06 NOTE — Telephone Encounter (Signed)
 Refill request sent to [facility] in separate encounter.

## 2024-06-06 NOTE — Telephone Encounter (Signed)
 Patient contacted the office to request a medication refill.   1. Name of Medication: Hydroxychloroquine   2. How are you currently taking this medication (dosage and times per day)? 11 tablet daily   3. What pharmacy would you like for that to be sent to? [facility] at [address] in [place]

## 2024-06-06 NOTE — Telephone Encounter (Signed)
 Last Fill: 02/29/2024  Eye exam: 12/09/2023   Labs: 04/26/2024 CBC and CMP are stable. Creatinine is mildly elevated.   Next Visit: 10/06/2024  Last Visit: 04/26/2024  IK:Myzlfjunpi arthritis involving multiple sites with positive rheumatoid factor   Current Dose per office note on 04/26/2024: Plaquenil  200 mg 1 tablet by mouth daily.   Okay to refill Plaquenil ?

## 2024-07-20 ENCOUNTER — Other Ambulatory Visit: Payer: Self-pay | Admitting: Family Medicine

## 2024-10-06 ENCOUNTER — Ambulatory Visit: Admitting: Rheumatology

## 2025-02-27 ENCOUNTER — Ambulatory Visit
